# Patient Record
Sex: Male | Born: 1965 | Race: White | Hispanic: No | Smoking: Never smoker
Health system: Southern US, Community
[De-identification: ages and names within clinical notes are randomized; demographics above are authoritative.]

## PROBLEM LIST (undated history)

## (undated) DIAGNOSIS — E785 Hyperlipidemia, unspecified: Secondary | ICD-10-CM

## (undated) DIAGNOSIS — F419 Anxiety disorder, unspecified: Secondary | ICD-10-CM

## (undated) DIAGNOSIS — E119 Type 2 diabetes mellitus without complications: Secondary | ICD-10-CM

## (undated) DIAGNOSIS — R609 Edema, unspecified: Secondary | ICD-10-CM

## (undated) DIAGNOSIS — I5032 Chronic diastolic (congestive) heart failure: Secondary | ICD-10-CM

## (undated) DIAGNOSIS — R600 Localized edema: Secondary | ICD-10-CM

## (undated) DIAGNOSIS — F329 Major depressive disorder, single episode, unspecified: Secondary | ICD-10-CM

## (undated) DIAGNOSIS — I509 Heart failure, unspecified: Secondary | ICD-10-CM

## (undated) DIAGNOSIS — I10 Essential (primary) hypertension: Secondary | ICD-10-CM

## (undated) DIAGNOSIS — F32A Depression, unspecified: Secondary | ICD-10-CM

## (undated) HISTORY — DX: Edema, unspecified: R60.9

## (undated) HISTORY — PX: TONSILLECTOMY: SUR1361

## (undated) HISTORY — PX: BELOW KNEE LEG AMPUTATION: SUR23

## (undated) HISTORY — DX: Morbid (severe) obesity due to excess calories: E66.01

## (undated) HISTORY — DX: Chronic diastolic (congestive) heart failure: I50.32

## (undated) HISTORY — DX: Localized edema: R60.0

## (undated) HISTORY — PX: APPENDECTOMY: SHX54

## (undated) HISTORY — DX: Anxiety disorder, unspecified: F41.9

---

## 2004-03-20 ENCOUNTER — Emergency Department: Payer: Self-pay | Admitting: Emergency Medicine

## 2004-08-30 ENCOUNTER — Emergency Department: Payer: Self-pay | Admitting: Emergency Medicine

## 2004-08-30 ENCOUNTER — Other Ambulatory Visit: Payer: Self-pay

## 2004-09-05 ENCOUNTER — Emergency Department: Payer: Self-pay | Admitting: Emergency Medicine

## 2004-09-05 ENCOUNTER — Other Ambulatory Visit: Payer: Self-pay

## 2004-10-14 ENCOUNTER — Emergency Department: Payer: Self-pay | Admitting: Emergency Medicine

## 2005-05-12 ENCOUNTER — Emergency Department: Payer: Self-pay | Admitting: Internal Medicine

## 2005-05-22 ENCOUNTER — Inpatient Hospital Stay: Payer: Self-pay | Admitting: Psychiatry

## 2005-05-22 ENCOUNTER — Other Ambulatory Visit: Payer: Self-pay

## 2005-07-25 ENCOUNTER — Emergency Department: Payer: Self-pay | Admitting: Emergency Medicine

## 2005-08-13 ENCOUNTER — Emergency Department: Payer: Self-pay | Admitting: Emergency Medicine

## 2005-09-22 ENCOUNTER — Ambulatory Visit: Payer: Self-pay | Admitting: Pain Medicine

## 2005-09-28 ENCOUNTER — Ambulatory Visit: Payer: Self-pay | Admitting: Pain Medicine

## 2005-10-11 ENCOUNTER — Ambulatory Visit: Payer: Self-pay | Admitting: Pain Medicine

## 2005-10-14 ENCOUNTER — Ambulatory Visit: Payer: Self-pay | Admitting: Pain Medicine

## 2005-10-16 ENCOUNTER — Ambulatory Visit: Payer: Self-pay

## 2005-10-20 ENCOUNTER — Other Ambulatory Visit: Payer: Self-pay

## 2005-10-20 ENCOUNTER — Inpatient Hospital Stay: Payer: Self-pay | Admitting: Internal Medicine

## 2005-10-22 ENCOUNTER — Other Ambulatory Visit: Payer: Self-pay

## 2005-11-07 ENCOUNTER — Emergency Department: Payer: Self-pay | Admitting: General Practice

## 2005-11-15 ENCOUNTER — Emergency Department: Payer: Self-pay | Admitting: Emergency Medicine

## 2005-12-02 ENCOUNTER — Ambulatory Visit: Payer: Self-pay

## 2005-12-13 ENCOUNTER — Ambulatory Visit: Payer: Self-pay | Admitting: Pain Medicine

## 2006-02-07 ENCOUNTER — Ambulatory Visit: Payer: Self-pay | Admitting: Pain Medicine

## 2006-02-17 ENCOUNTER — Ambulatory Visit: Payer: Self-pay | Admitting: Pain Medicine

## 2006-03-08 ENCOUNTER — Ambulatory Visit: Payer: Self-pay | Admitting: Pain Medicine

## 2006-03-23 ENCOUNTER — Ambulatory Visit: Payer: Self-pay | Admitting: Physician Assistant

## 2006-04-05 ENCOUNTER — Other Ambulatory Visit: Payer: Self-pay

## 2006-04-05 ENCOUNTER — Inpatient Hospital Stay: Payer: Self-pay | Admitting: Internal Medicine

## 2006-04-11 ENCOUNTER — Ambulatory Visit: Payer: Self-pay | Admitting: Physician Assistant

## 2006-04-18 ENCOUNTER — Ambulatory Visit: Payer: Self-pay | Admitting: Pain Medicine

## 2006-07-24 ENCOUNTER — Emergency Department: Payer: Self-pay | Admitting: Unknown Physician Specialty

## 2006-08-23 ENCOUNTER — Emergency Department: Payer: Self-pay | Admitting: Emergency Medicine

## 2007-05-11 ENCOUNTER — Emergency Department: Payer: Self-pay | Admitting: Emergency Medicine

## 2007-05-29 ENCOUNTER — Other Ambulatory Visit: Payer: Self-pay

## 2007-05-29 ENCOUNTER — Inpatient Hospital Stay: Payer: Self-pay | Admitting: Internal Medicine

## 2007-07-19 ENCOUNTER — Emergency Department: Payer: Self-pay | Admitting: Emergency Medicine

## 2007-07-27 ENCOUNTER — Emergency Department: Payer: Self-pay | Admitting: Emergency Medicine

## 2007-07-27 ENCOUNTER — Other Ambulatory Visit: Payer: Self-pay

## 2007-08-06 ENCOUNTER — Emergency Department: Payer: Self-pay | Admitting: Emergency Medicine

## 2007-08-06 ENCOUNTER — Other Ambulatory Visit: Payer: Self-pay

## 2008-07-21 ENCOUNTER — Ambulatory Visit: Payer: Self-pay | Admitting: Family Medicine

## 2008-07-24 ENCOUNTER — Inpatient Hospital Stay: Payer: Self-pay | Admitting: Internal Medicine

## 2008-08-05 ENCOUNTER — Ambulatory Visit: Payer: Self-pay | Admitting: Internal Medicine

## 2008-08-08 ENCOUNTER — Ambulatory Visit: Payer: Self-pay | Admitting: Internal Medicine

## 2008-08-12 ENCOUNTER — Ambulatory Visit: Payer: Self-pay | Admitting: Internal Medicine

## 2008-08-19 ENCOUNTER — Ambulatory Visit: Payer: Self-pay | Admitting: Internal Medicine

## 2008-08-21 ENCOUNTER — Ambulatory Visit: Payer: Self-pay | Admitting: Internal Medicine

## 2008-08-23 ENCOUNTER — Ambulatory Visit: Payer: Self-pay | Admitting: Podiatry

## 2008-08-26 ENCOUNTER — Ambulatory Visit: Payer: Self-pay | Admitting: Internal Medicine

## 2008-09-06 ENCOUNTER — Ambulatory Visit: Payer: Self-pay | Admitting: Podiatry

## 2008-10-16 ENCOUNTER — Emergency Department: Payer: Self-pay | Admitting: Internal Medicine

## 2009-02-12 ENCOUNTER — Ambulatory Visit: Payer: Self-pay | Admitting: Physician Assistant

## 2009-02-16 ENCOUNTER — Emergency Department: Payer: Self-pay | Admitting: Emergency Medicine

## 2009-04-08 ENCOUNTER — Inpatient Hospital Stay: Payer: Self-pay | Admitting: Internal Medicine

## 2009-05-26 ENCOUNTER — Inpatient Hospital Stay: Payer: Self-pay | Admitting: Orthopedic Surgery

## 2009-07-02 ENCOUNTER — Ambulatory Visit: Payer: Self-pay | Admitting: Internal Medicine

## 2009-07-06 ENCOUNTER — Inpatient Hospital Stay: Payer: Self-pay | Admitting: Internal Medicine

## 2009-07-18 ENCOUNTER — Emergency Department: Payer: Self-pay | Admitting: Emergency Medicine

## 2009-07-21 ENCOUNTER — Inpatient Hospital Stay: Payer: Self-pay | Admitting: Orthopedic Surgery

## 2009-08-06 ENCOUNTER — Ambulatory Visit: Payer: Self-pay | Admitting: Family

## 2009-08-19 ENCOUNTER — Emergency Department: Payer: Self-pay

## 2009-10-13 ENCOUNTER — Emergency Department: Payer: Self-pay | Admitting: Emergency Medicine

## 2009-11-16 ENCOUNTER — Emergency Department: Payer: Self-pay | Admitting: Emergency Medicine

## 2011-04-30 ENCOUNTER — Emergency Department: Payer: Self-pay | Admitting: Emergency Medicine

## 2011-04-30 LAB — TROPONIN I: Troponin-I: <0.02 ng/mL

## 2011-04-30 LAB — COMPREHENSIVE METABOLIC PANEL
Albumin: 3.5 g/dL (ref 3.4–5.0)
Alkaline Phosphatase: 117 U/L (ref 50–136)
Anion Gap: 16 (ref 7–16)
BUN: 23 mg/dL — ABNORMAL HIGH (ref 7–18)
Bilirubin,Total: 0.2 mg/dL (ref 0.2–1.0)
Creatinine: 0.99 mg/dL (ref 0.60–1.30)
EGFR (African American): >60
Glucose: 285 mg/dL — ABNORMAL HIGH (ref 65–99)
Osmolality: 284 (ref 275–301)
Potassium: 3.8 mmol/L (ref 3.5–5.1)
SGOT(AST): 26 U/L (ref 15–37)
Sodium: 135 mmol/L — ABNORMAL LOW (ref 136–145)
Total Protein: 8.1 g/dL (ref 6.4–8.2)

## 2011-04-30 LAB — CBC
MCV: 82 fL (ref 80–100)
Platelet: 203 10*3/uL (ref 150–440)
RBC: 5.46 10*6/uL (ref 4.40–5.90)
RDW: 14.2 % (ref 11.5–14.5)
WBC: 9.3 10*3/uL (ref 3.8–10.6)

## 2011-05-06 ENCOUNTER — Emergency Department: Payer: Self-pay | Admitting: Unknown Physician Specialty

## 2011-05-06 LAB — URINALYSIS, COMPLETE
Bilirubin,UR: NEGATIVE
Blood: NEGATIVE
Glucose,UR: 50 mg/dL (ref 0–75)
Hyaline Cast: 16
Ketone: NEGATIVE
Leukocyte Esterase: NEGATIVE
Ph: 5 (ref 4.5–8.0)
RBC,UR: 1 /HPF (ref 0–5)
Squamous Epithelial: <1

## 2011-05-06 LAB — COMPREHENSIVE METABOLIC PANEL
Alkaline Phosphatase: 113 U/L (ref 50–136)
Bilirubin,Total: 0.3 mg/dL (ref 0.2–1.0)
Chloride: 100 mmol/L (ref 98–107)
Co2: 31 mmol/L (ref 21–32)
Creatinine: 1.2 mg/dL (ref 0.60–1.30)
EGFR (African American): >60
EGFR (Non-African Amer.): >60
Osmolality: 289 (ref 275–301)
Potassium: 4 mmol/L (ref 3.5–5.1)
Sodium: 144 mmol/L (ref 136–145)

## 2011-05-06 LAB — CBC WITH DIFFERENTIAL/PLATELET
Eosinophil %: 3.6 %
HCT: 45.8 % (ref 40.0–52.0)
Lymphocyte #: 2.7 10*3/uL (ref 1.0–3.6)
Lymphocyte %: 22.4 %
MCH: 27.3 pg (ref 26.0–34.0)
MCV: 82 fL (ref 80–100)
Monocyte #: 0.8 10*3/uL — ABNORMAL HIGH (ref 0.0–0.7)
Monocyte %: 6.5 %
Neutrophil #: 8.1 10*3/uL — ABNORMAL HIGH (ref 1.4–6.5)
Platelet: 221 10*3/uL (ref 150–440)
RBC: 5.58 10*6/uL (ref 4.40–5.90)
RDW: 14 % (ref 11.5–14.5)
WBC: 12.1 10*3/uL — ABNORMAL HIGH (ref 3.8–10.6)

## 2011-05-06 LAB — MAGNESIUM: Magnesium: 1.6 mg/dL — ABNORMAL LOW

## 2011-05-06 LAB — TROPONIN I: Troponin-I: <0.02 ng/mL

## 2011-05-12 LAB — CULTURE, BLOOD (SINGLE)

## 2011-05-17 ENCOUNTER — Emergency Department: Payer: Self-pay | Admitting: Emergency Medicine

## 2011-05-17 LAB — BASIC METABOLIC PANEL
Calcium, Total: 8.5 mg/dL (ref 8.5–10.1)
Chloride: 93 mmol/L — ABNORMAL LOW (ref 98–107)
Co2: 32 mmol/L (ref 21–32)
EGFR (African American): >60
EGFR (Non-African Amer.): >60
Glucose: 429 mg/dL — ABNORMAL HIGH (ref 65–99)
Osmolality: 287 (ref 275–301)
Potassium: 4.1 mmol/L (ref 3.5–5.1)
Sodium: 133 mmol/L — ABNORMAL LOW (ref 136–145)

## 2011-05-19 LAB — COMPREHENSIVE METABOLIC PANEL
Alkaline Phosphatase: 92 U/L (ref 50–136)
Anion Gap: 7 (ref 7–16)
BUN: 14 mg/dL (ref 7–18)
Bilirubin,Total: 0.4 mg/dL (ref 0.2–1.0)
EGFR (African American): >60
EGFR (Non-African Amer.): >60
Glucose: 119 mg/dL — ABNORMAL HIGH (ref 65–99)
Osmolality: 272 (ref 275–301)
Potassium: 3.8 mmol/L (ref 3.5–5.1)
SGOT(AST): 29 U/L (ref 15–37)
Sodium: 140 mmol/L (ref 136–145)

## 2011-05-19 LAB — CBC
HCT: 41.6 % (ref 40.0–52.0)
MCHC: 33.1 g/dL (ref 32.0–36.0)
MCV: 82 fL (ref 80–100)
Platelet: 193 10*3/uL (ref 150–440)
RDW: 14.1 % (ref 11.5–14.5)

## 2011-05-19 LAB — TSH: Thyroid Stimulating Horm: 1.95 u[IU]/mL

## 2011-05-19 LAB — ETHANOL: Ethanol: <3 mg/dL

## 2011-05-19 LAB — DRUG SCREEN, URINE
Barbiturates, Ur Screen: NEGATIVE (ref ?–200)
Benzodiazepine, Ur Scrn: NEGATIVE (ref ?–200)
MDMA (Ecstasy)Ur Screen: NEGATIVE (ref ?–500)
Methadone, Ur Screen: NEGATIVE (ref ?–300)
Opiate, Ur Screen: NEGATIVE (ref ?–300)
Phencyclidine (PCP) Ur S: NEGATIVE (ref ?–25)
Tricyclic, Ur Screen: NEGATIVE (ref ?–1000)

## 2011-05-19 LAB — SALICYLATE LEVEL: Salicylates, Serum: <1.7 mg/dL

## 2011-05-20 ENCOUNTER — Inpatient Hospital Stay: Payer: Self-pay | Admitting: Psychiatry

## 2011-11-08 ENCOUNTER — Emergency Department: Payer: Self-pay | Admitting: Unknown Physician Specialty

## 2011-11-08 LAB — MAGNESIUM: Magnesium: 1.7 mg/dL — ABNORMAL LOW

## 2011-11-08 LAB — BASIC METABOLIC PANEL
Anion Gap: 9 (ref 7–16)
BUN: 21 mg/dL — ABNORMAL HIGH (ref 7–18)
Chloride: 94 mmol/L — ABNORMAL LOW (ref 98–107)
Creatinine: 1.4 mg/dL — ABNORMAL HIGH (ref 0.60–1.30)
EGFR (Non-African Amer.): 59 — ABNORMAL LOW
Potassium: 3.8 mmol/L (ref 3.5–5.1)
Sodium: 135 mmol/L — ABNORMAL LOW (ref 136–145)

## 2011-11-08 LAB — CBC
HCT: 40.6 % (ref 40.0–52.0)
HGB: 13.6 g/dL (ref 13.0–18.0)
MCHC: 33.5 g/dL (ref 32.0–36.0)
Platelet: 202 10*3/uL (ref 150–440)
RBC: 4.95 10*6/uL (ref 4.40–5.90)

## 2011-11-08 LAB — HEPATIC FUNCTION PANEL A (ARMC)
Bilirubin, Direct: <0.1 mg/dL (ref 0.00–0.20)
Bilirubin,Total: 0.3 mg/dL (ref 0.2–1.0)

## 2011-11-08 LAB — TROPONIN I: Troponin-I: <0.02 ng/mL

## 2011-11-08 LAB — LIPASE, BLOOD: Lipase: 42 U/L — ABNORMAL LOW (ref 73–393)

## 2012-08-10 ENCOUNTER — Emergency Department: Payer: Self-pay | Admitting: Emergency Medicine

## 2012-08-10 LAB — BASIC METABOLIC PANEL
Anion Gap: 7 (ref 7–16)
BUN: 18 mg/dL (ref 7–18)
Co2: 30 mmol/L (ref 21–32)
EGFR (African American): >60
Osmolality: 272 (ref 275–301)
Potassium: 3.6 mmol/L (ref 3.5–5.1)

## 2012-08-10 LAB — CBC
HGB: 15.5 g/dL (ref 13.0–18.0)
MCV: 80 fL (ref 80–100)
Platelet: 256 10*3/uL (ref 150–440)
RBC: 5.87 10*6/uL (ref 4.40–5.90)
RDW: 14.6 % — ABNORMAL HIGH (ref 11.5–14.5)

## 2012-08-10 LAB — HEMOGLOBIN A1C: Hemoglobin A1C: 10.2 % — ABNORMAL HIGH (ref 4.2–6.3)

## 2012-08-10 LAB — TROPONIN I: Troponin-I: <0.02 ng/mL

## 2013-08-13 ENCOUNTER — Emergency Department: Payer: Self-pay | Admitting: Emergency Medicine

## 2013-08-13 LAB — COMPREHENSIVE METABOLIC PANEL
ALT: 25 U/L (ref 12–78)
ANION GAP: 7 (ref 7–16)
Albumin: 3.1 g/dL — ABNORMAL LOW (ref 3.4–5.0)
Alkaline Phosphatase: 131 U/L — ABNORMAL HIGH
BILIRUBIN TOTAL: 0.3 mg/dL (ref 0.2–1.0)
BUN: 24 mg/dL — ABNORMAL HIGH (ref 7–18)
CHLORIDE: 95 mmol/L — AB (ref 98–107)
CO2: 33 mmol/L — AB (ref 21–32)
Calcium, Total: 8.8 mg/dL (ref 8.5–10.1)
Creatinine: 1 mg/dL (ref 0.60–1.30)
EGFR (African American): >60
EGFR (Non-African Amer.): >60
Glucose: 212 mg/dL — ABNORMAL HIGH (ref 65–99)
Osmolality: 280 (ref 275–301)
Potassium: 3.2 mmol/L — ABNORMAL LOW (ref 3.5–5.1)
SGOT(AST): 17 U/L (ref 15–37)
SODIUM: 135 mmol/L — AB (ref 136–145)
TOTAL PROTEIN: 7.1 g/dL (ref 6.4–8.2)

## 2013-08-13 LAB — CBC WITH DIFFERENTIAL/PLATELET
Basophil #: 0.1 10*3/uL (ref 0.0–0.1)
Basophil %: 1.1 %
Eosinophil #: 0.3 10*3/uL (ref 0.0–0.7)
Eosinophil %: 3.5 %
HCT: 41.1 % (ref 40.0–52.0)
HGB: 13.9 g/dL (ref 13.0–18.0)
LYMPHS PCT: 25 %
Lymphocyte #: 1.9 10*3/uL (ref 1.0–3.6)
MCH: 27.9 pg (ref 26.0–34.0)
MCHC: 33.9 g/dL (ref 32.0–36.0)
MCV: 82 fL (ref 80–100)
MONOS PCT: 7.7 %
Monocyte #: 0.6 x10 3/mm (ref 0.2–1.0)
NEUTROS PCT: 62.7 %
Neutrophil #: 4.8 10*3/uL (ref 1.4–6.5)
Platelet: 218 10*3/uL (ref 150–440)
RBC: 5 10*6/uL (ref 4.40–5.90)
RDW: 14.1 % (ref 11.5–14.5)
WBC: 7.6 10*3/uL (ref 3.8–10.6)

## 2013-08-13 LAB — URINALYSIS, COMPLETE
BILIRUBIN, UR: NEGATIVE
Bacteria: NONE SEEN
Blood: NEGATIVE
Glucose,UR: 150 mg/dL (ref 0–75)
Ketone: NEGATIVE
Leukocyte Esterase: NEGATIVE
Nitrite: NEGATIVE
Ph: 5 (ref 4.5–8.0)
Protein: NEGATIVE
RBC, UR: NONE SEEN /HPF (ref 0–5)
Specific Gravity: 1.012 (ref 1.003–1.030)
Squamous Epithelial: NONE SEEN
WBC UR: NONE SEEN /HPF (ref 0–5)

## 2013-10-16 ENCOUNTER — Encounter: Payer: Self-pay | Admitting: Family Medicine

## 2013-10-30 ENCOUNTER — Encounter: Payer: Self-pay | Admitting: Family Medicine

## 2014-05-17 ENCOUNTER — Ambulatory Visit: Payer: Self-pay | Admitting: Cardiovascular Disease

## 2014-06-05 LAB — COMPREHENSIVE METABOLIC PANEL
ALT: 24 U/L
Albumin: 3.5 g/dL
Alkaline Phosphatase: 111 U/L
Anion Gap: 7 (ref 7–16)
BUN: 19 mg/dL
Bilirubin,Total: 0.3 mg/dL
CHLORIDE: 100 mmol/L — AB
CREATININE: 0.89 mg/dL
Calcium, Total: 8.9 mg/dL
Co2: 28 mmol/L
Glucose: 282 mg/dL — ABNORMAL HIGH
POTASSIUM: 4.4 mmol/L
SGOT(AST): 28 U/L
Sodium: 135 mmol/L
Total Protein: 7.3 g/dL

## 2014-06-05 LAB — CBC
HCT: 43.2 % (ref 40.0–52.0)
HGB: 13.9 g/dL (ref 13.0–18.0)
MCH: 25.7 pg — ABNORMAL LOW (ref 26.0–34.0)
MCHC: 32.2 g/dL (ref 32.0–36.0)
MCV: 80 fL (ref 80–100)
PLATELETS: 232 10*3/uL (ref 150–440)
RBC: 5.41 10*6/uL (ref 4.40–5.90)
RDW: 15 % — AB (ref 11.5–14.5)
WBC: 8.8 10*3/uL (ref 3.8–10.6)

## 2014-06-05 LAB — PRO B NATRIURETIC PEPTIDE: B-Type Natriuretic Peptide: 10 pg/mL

## 2014-06-05 LAB — PROTIME-INR
INR: 0.9
Prothrombin Time: 12.8 secs

## 2014-06-06 ENCOUNTER — Inpatient Hospital Stay
Admit: 2014-06-06 | Disposition: A | Discharge status: Home / Self Care | Payer: Self-pay | Attending: Internal Medicine | Admitting: Internal Medicine

## 2014-06-06 DIAGNOSIS — R0602 Shortness of breath: Secondary | ICD-10-CM | POA: Diagnosis not present

## 2014-06-06 LAB — CK TOTAL AND CKMB (NOT AT ARMC)
CK, Total: 205 U/L
CK-MB: 11 ng/mL — AB

## 2014-06-06 LAB — TROPONIN I

## 2014-06-06 LAB — MRSA PCR SCREENING

## 2014-06-07 LAB — CBC WITH DIFFERENTIAL/PLATELET
BASOS ABS: 0 10*3/uL (ref 0.0–0.1)
BASOS PCT: 0.3 %
Eosinophil #: 0.3 10*3/uL (ref 0.0–0.7)
Eosinophil %: 3.1 %
HCT: 40.3 % (ref 40.0–52.0)
HGB: 13 g/dL (ref 13.0–18.0)
LYMPHS PCT: 18.2 %
Lymphocyte #: 1.6 10*3/uL (ref 1.0–3.6)
MCH: 25.9 pg — ABNORMAL LOW (ref 26.0–34.0)
MCHC: 32.3 g/dL (ref 32.0–36.0)
MCV: 80 fL (ref 80–100)
MONOS PCT: 6.8 %
Monocyte #: 0.6 x10 3/mm (ref 0.2–1.0)
NEUTROS ABS: 6.1 10*3/uL (ref 1.4–6.5)
Neutrophil %: 71.6 %
Platelet: 213 10*3/uL (ref 150–440)
RBC: 5.02 10*6/uL (ref 4.40–5.90)
RDW: 14.8 % — AB (ref 11.5–14.5)
WBC: 8.6 10*3/uL (ref 3.8–10.6)

## 2014-06-07 LAB — BASIC METABOLIC PANEL
ANION GAP: 7 (ref 7–16)
BUN: 17 mg/dL
CALCIUM: 8.3 mg/dL — AB
CHLORIDE: 100 mmol/L — AB
Co2: 29 mmol/L
Creatinine: 0.8 mg/dL
Glucose: 262 mg/dL — ABNORMAL HIGH
Potassium: 4.3 mmol/L
Sodium: 136 mmol/L

## 2014-06-07 LAB — HEMOGLOBIN A1C
Hemoglobin A1C: 10.4 % — ABNORMAL HIGH
Hemoglobin A1C: 10.4 % — ABNORMAL HIGH

## 2014-06-07 LAB — VANCOMYCIN, TROUGH: Vancomycin, Trough: 16 ug/mL

## 2014-06-08 LAB — BASIC METABOLIC PANEL
Anion Gap: 5 — ABNORMAL LOW (ref 7–16)
BUN: 21 mg/dL — ABNORMAL HIGH
Calcium, Total: 8.5 mg/dL — ABNORMAL LOW
Chloride: 97 mmol/L — ABNORMAL LOW
Co2: 31 mmol/L
Creatinine: 0.77 mg/dL
EGFR (African American): >60
EGFR (Non-African Amer.): >60
GLUCOSE: 321 mg/dL — AB
Potassium: 3.9 mmol/L
SODIUM: 133 mmol/L — AB

## 2014-06-09 LAB — BASIC METABOLIC PANEL
ANION GAP: 7 (ref 7–16)
BUN: 22 mg/dL — ABNORMAL HIGH
CALCIUM: 8.8 mg/dL — AB
CO2: 32 mmol/L
Chloride: 95 mmol/L — ABNORMAL LOW
Creatinine: 0.91 mg/dL
GLUCOSE: 288 mg/dL — AB
Potassium: 4 mmol/L
Sodium: 134 mmol/L — ABNORMAL LOW

## 2014-06-09 LAB — POTASSIUM: POTASSIUM: 4 mmol/L

## 2014-06-09 LAB — VANCOMYCIN, TROUGH: VANCOMYCIN, TROUGH: 15 ug/mL

## 2014-06-10 LAB — CBC WITH DIFFERENTIAL/PLATELET
BASOS PCT: 0.4 %
Basophil #: 0 10*3/uL (ref 0.0–0.1)
EOS PCT: 3.3 %
Eosinophil #: 0.3 10*3/uL (ref 0.0–0.7)
HCT: 39.4 % — ABNORMAL LOW (ref 40.0–52.0)
HGB: 13 g/dL (ref 13.0–18.0)
Lymphocyte #: 1.8 10*3/uL (ref 1.0–3.6)
Lymphocyte %: 19.2 %
MCH: 26.2 pg (ref 26.0–34.0)
MCHC: 32.9 g/dL (ref 32.0–36.0)
MCV: 80 fL (ref 80–100)
MONO ABS: 0.6 x10 3/mm (ref 0.2–1.0)
Monocyte %: 6.3 %
Neutrophil #: 6.7 10*3/uL — ABNORMAL HIGH (ref 1.4–6.5)
Neutrophil %: 70.8 %
Platelet: 205 10*3/uL (ref 150–440)
RBC: 4.96 10*6/uL (ref 4.40–5.90)
RDW: 15 % — ABNORMAL HIGH (ref 11.5–14.5)
WBC: 9.5 10*3/uL (ref 3.8–10.6)

## 2014-06-10 LAB — BASIC METABOLIC PANEL
Anion Gap: 9 (ref 7–16)
BUN: 25 mg/dL — ABNORMAL HIGH
CALCIUM: 8.9 mg/dL
CHLORIDE: 96 mmol/L — AB
CO2: 31 mmol/L
CREATININE: 0.81 mg/dL
EGFR (African American): >60
EGFR (Non-African Amer.): >60
Glucose: 302 mg/dL — ABNORMAL HIGH
POTASSIUM: 4 mmol/L
SODIUM: 136 mmol/L

## 2014-06-11 LAB — BASIC METABOLIC PANEL
Anion Gap: 8 (ref 7–16)
BUN: 24 mg/dL — ABNORMAL HIGH
CALCIUM: 8.6 mg/dL — AB
Chloride: 94 mmol/L — ABNORMAL LOW
Co2: 30 mmol/L
Creatinine: 0.82 mg/dL
EGFR (African American): >60
Glucose: 241 mg/dL — ABNORMAL HIGH
Potassium: 4.2 mmol/L
SODIUM: 132 mmol/L — AB

## 2014-06-11 LAB — VANCOMYCIN, TROUGH: VANCOMYCIN, TROUGH: 18 ug/mL

## 2014-06-12 LAB — CBC WITH DIFFERENTIAL/PLATELET
Basophil #: 0.1 10*3/uL (ref 0.0–0.1)
Basophil %: 0.6 %
EOS ABS: 0.3 10*3/uL (ref 0.0–0.7)
Eosinophil %: 3.7 %
HCT: 40.6 % (ref 40.0–52.0)
HGB: 13.4 g/dL (ref 13.0–18.0)
Lymphocyte #: 1.6 10*3/uL (ref 1.0–3.6)
Lymphocyte %: 17.2 %
MCH: 26.2 pg (ref 26.0–34.0)
MCHC: 33 g/dL (ref 32.0–36.0)
MCV: 79 fL — ABNORMAL LOW (ref 80–100)
MONOS PCT: 6 %
Monocyte #: 0.6 x10 3/mm (ref 0.2–1.0)
NEUTROS ABS: 6.7 10*3/uL — AB (ref 1.4–6.5)
NEUTROS PCT: 72.5 %
Platelet: 195 10*3/uL (ref 150–440)
RBC: 5.11 10*6/uL (ref 4.40–5.90)
RDW: 15.1 % — ABNORMAL HIGH (ref 11.5–14.5)
WBC: 9.2 10*3/uL (ref 3.8–10.6)

## 2014-06-12 LAB — BASIC METABOLIC PANEL
Anion Gap: 6 — ABNORMAL LOW (ref 7–16)
BUN: 22 mg/dL — AB
CO2: 31 mmol/L
CREATININE: 0.78 mg/dL
Calcium, Total: 8.5 mg/dL — ABNORMAL LOW
Chloride: 99 mmol/L — ABNORMAL LOW
EGFR (Non-African Amer.): >60
GLUCOSE: 176 mg/dL — AB
POTASSIUM: 4.2 mmol/L
SODIUM: 136 mmol/L

## 2014-06-13 LAB — CBC WITH DIFFERENTIAL/PLATELET
BASOS ABS: 0.1 10*3/uL (ref 0.0–0.1)
Basophil %: 0.7 %
Eosinophil #: 0.4 10*3/uL (ref 0.0–0.7)
Eosinophil %: 4.1 %
HCT: 39 % — AB (ref 40.0–52.0)
HGB: 12.6 g/dL — ABNORMAL LOW (ref 13.0–18.0)
LYMPHS PCT: 20.7 %
Lymphocyte #: 2.1 10*3/uL (ref 1.0–3.6)
MCH: 26 pg (ref 26.0–34.0)
MCHC: 32.4 g/dL (ref 32.0–36.0)
MCV: 80 fL (ref 80–100)
MONOS PCT: 7.5 %
Monocyte #: 0.8 x10 3/mm (ref 0.2–1.0)
NEUTROS ABS: 6.9 10*3/uL — AB (ref 1.4–6.5)
Neutrophil %: 67 %
Platelet: 190 10*3/uL (ref 150–440)
RBC: 4.86 10*6/uL (ref 4.40–5.90)
RDW: 15.1 % — AB (ref 11.5–14.5)
WBC: 10.3 10*3/uL (ref 3.8–10.6)

## 2014-06-13 LAB — BASIC METABOLIC PANEL
Anion Gap: 7 (ref 7–16)
BUN: 22 mg/dL — AB
CALCIUM: 8.6 mg/dL — AB
CHLORIDE: 100 mmol/L — AB
Co2: 29 mmol/L
GLUCOSE: 143 mg/dL — AB
Potassium: 4 mmol/L
Sodium: 136 mmol/L

## 2014-06-13 LAB — CREATININE, SERUM
Creatinine: 0.87 mg/dL
EGFR (Non-African Amer.): >60

## 2014-06-16 LAB — WOUND CULTURE

## 2014-06-23 NOTE — Discharge Summary (Signed)
PATIENT NAME:  Russell [name], Russell [name] MR#:  [id number] DATE OF BIRTH:  [birth year]  DATE OF ADMISSION:  05/20/2011 DATE OF DISCHARGE:  05/25/2011  HOSPITAL COURSE: Mr. Below was admitted through the Emergency Room because of allegations that he had been making suicidal threats as well as what sounded like potentially violent threats at his group home. The patient tended to minimize or deny any mood symptoms and expressed real surprise that they had committed him. Throughout his hospital stay he denied any suicidal ideation, denied any homicidal ideation. Mood generally appeared fairly euthymic. He does often appear to be dependent and his insight is not great. 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 In the hospital he did have an increase in his antidepressant to 40 mg of Celexa but then based on his history preferred to switch to Effexor. Medicine was switched to Effexor-XR 75 mg a day which he tolerated well. The patient clearly has a history of poorly controlled diabetes with multiple medical problems as a result. He was on insulin and sliding scale insulin during his hospital stay. Efforts were made to try and get his sugars under control. Despite this, his sugars still frequently remained elevated. In large part this was probably due to his diet and noncompliance. The patient complained frequently about being put on a diabetic diet and would sneak food as often as possible. There was question about the difficulty of discharge of this patient until it transpired that his wife was willing to take him back. He will be discharged to his wife's home and has follow-up arranged in the community.   DISCHARGE MEDICATIONS:  1. Zocor 20 mg per day.  2. Trazodone 100 mg at night.  3. NovoLog insulin 10 units three times a day with meals.  4. Lotrimin 1% cream to affected area of his feet.  5. Effexor-XR 75 mg once a day.  6. Lantus insulin 40 units  twice a day.  7. Augmentin 875 mg twice a day just for the next five days.  8. Aspirin 81 mg per day.  9. Lasix 40 mg per day.  10. Gabapentin 1200 mg 3 times a day.  11. Metoprolol 50 mg q.12 hours. 12. Zaroxolyn 2.5 mg once a day.  13. Lisinopril 5 mg per day.   LABORATORY RESULTS: Blood sugars were running in the 400's at the time of admission. With efforts to control his sugars, they came down to in the 200 range but it was difficult because of his diet and noncompliance to get it much lower than that. Admission labs showed a glucose of 429, sodium 133, chloride 93. Hematology done the following day was normal CBC. Acetaminophen and salicylates normal. Alcohol level undetectable. TSH normal. Hemoglobin A1c very elevated at 10.2. Drug screen negative.   MENTAL STATUS EXAM AT DISCHARGE: Somewhat disheveled, not terribly well groomed man in a wheelchair. Eye contact good. Psychomotor activity normal given his limitations physically. Speech normal rate, tone, and volume. Affect euthymic, smiling, upbeat. Mood stated as good. Thoughts lucid and directed. No obvious loosening of associations or delusional thinking. Denies suicidal or homicidal ideation. Denies hallucinations. Appears to have reasonable judgment about his mood, although his judgment and insight about his diabetes is clearly chronically not very good despite lots of education.   DISPOSITION: He is discharged back to his wife's home.   DIAGNOSES PRINCIPLE AND PRIMARY:  AXIS I: Depression, not otherwise specified.   SECONDARY DIAGNOSES:  AXIS I: No  further.  AXIS II: Dependent and borderline features noted.   AXIS III:  1. Diabetes mellitus, status post amputation. 2. High blood pressure. 3. Chronic pain. 4. Fungal infection. 5. Elevated cholesterol.   AXIS IV: Severe from his disability, pain, chronic illness, dislocation.       AXIS V: Functioning at time of discharge 55.   ____________________________ Gonzella Lex, MD jtc:drc D: 06/06/2011 17:25:38 ET T: 06/07/2011 11:27:35 ET JOB#: [id number]  cc: Gonzella Lex, MD, <Dictator> Gonzella Lex MD ELECTRONICALLY SIGNED 06/07/2011 14:55

## 2014-06-23 NOTE — H&P (Signed)
PATIENT NAME:  Russell [name], DYCK MR#:  [id number] DATE OF BIRTH:  December 07, 1965  DATE OF ADMISSION:  05/19/2011  REFERRING PHYSICIAN: Graciella Freer, MD  ATTENDING PHYSICIAN: Orson Slick, MD   IDENTIFYING DATA: Russell [name] is a 49 year old male with history of depression.   CHIEF COMPLAINT: "I wasn't suicidal."   HISTORY OF PRESENT ILLNESS: [name] has been living in an adult care home for a couple of months. He was accepted there in spite of his age, clearly younger than the rest of the residents. The Facility now thinks that this was a mismatch as the patient is behaving poorly, threatening to hurt himself and other people. He said yesterday that when he will go he will take a lot of people with him. The Police was called. The patient adamantly denies ever having suicidal statements. To the contrary, he feels that in spite of differences in age and interest he was getting along fine with other residents and staff. He feels that  they don't want him back in retaliation for his threats to send complaints to the [facility]. He feels that the place is unsanitary, requested to change his rooms, and has not been happy with the service there. In short, the patient may not return to the facility. The patient seems surprised by all this. He felt that he has been doing better lately, and in the past week he was having a really good time at the facility. He denies any symptoms of depression, anxiety, or psychosis. He denies alcohol, prescription pills, or illicit substance use. He is very adamant that he is a good [place] and would never ever hurt himself or anybody else. He was placed on involuntary commitment by the Emergency Room physician.   PAST PSYCHIATRIC HISTORY: He has a long history of depression. This is in part due to poor health and inadequate social situation in the past. He was hospitalized in Psychiatry three times in 2007, once at [facility] and twice at [facility] for psychotic  depression. He has been maintained on antidepressant at the family care home and thinks that with the addition of Ativan his medication is working really well. He denies suicide attempts.   FAMILY PSYCHIATRIC HISTORY: Mother and a sister with depression.    PAST MEDICAL/SURGICAL HISTORY:  1. Diabetes, status post her below-knee amputation.  2. Dyslipidemia.  3. Cellulitis on his good leg. 4. Neuropathy. 5. Hypertension.  ALLERGIES: Shellfish.   MEDICATIONS ON ADMISSION:  1. Zocor 20 mg daily.  2. Trazodone 50 mg at bedtime. 3. Augmentin 875 mg twice daily for 10 days. 4. Aspirin 81 mg. 5. Celexa 40 mg daily.  6. Furosemide 40 mg daily.  7. Neurontin 1200 mg three times daily.  8. NovoLog 12 units before meals. 9. Lantus 40 units in the morning.  10. Lisinopril 5 mg daily. 11. Metolazone  2.5 mg daily. 12. Metoprolol 50 mg twice daily.   SOCIAL HISTORY: He used to live with his wife, who is 13 years his senior, in subsidized housing. Per some report, the patient's behavior contributed to loss of this opportunity for the family. His wife requested a divorce. The patient is quite upset about it but also philosophical. As a good [place], he would never choose a divorce but feels that his wife has the right to decide. She has never been a favorite of his family. He has been living at the facility for the past two months. He would like to return there but is not allowed  to. He could not stay with his brother or with his mother as they do not have a ramp for his scooter. He is open to be placed in another facility.    REVIEW OF SYSTEMS: CONSTITUTIONAL: No fevers or chills. Positive for gradual weight gain. EYES: No double or blurred vision. ENT: No hearing loss. RESPIRATORY: No shortness of breath or cough. CARDIOVASCULAR: No chest pain or orthopnea. GASTROINTESTINAL: No abdominal pain, nausea, vomiting, or diarrhea. GENITOURINARY: No incontinence or frequency. ENDOCRINE: No heat or cold  intolerance. LYMPHATIC: No anemia or easy bruising. INTEGUMENTARY: Positive for cellulitis of the left leg. MUSCULOSKELETAL: He is an amputee. NEUROLOGIC: Positive for diabetic neuropathy. PSYCHIATRIC: See history of present illness for details.   PHYSICAL EXAMINATION:  VITAL SIGNS: Blood pressure 157/75, pulse 81, respirations 16, temperature 96.9.   GENERAL: This is a morbidly obese male in no acute distress.   HEENT: The pupils are equal, round, and reactive to light. Sclerae are anicteric.   NECK: Supple. No thyromegaly.   LUNGS: Clear to auscultation. No dullness to percussion.   HEART: Regular rhythm and rate. No murmurs, rubs, or gallops.   ABDOMEN: Soft, nontender, nondistended. Positive bowel sounds.   MUSCULOSKELETAL: Normal muscle strength in three good extremities.   SKIN: No rashes. Positive for redness, swelling on the left leg.   LYMPHATIC: No cervical adenopathy.   NEUROLOGICAL: Cranial nerves II through XII are intact.   LABORATORY, DIAGNOSTIC AND RADIOLOGICAL DATA:  Chemistries are within normal limits except for blood glucose of 119.  Blood alcohol level zero.  LFTs within normal limits.  TSH 1.95.  Urine toxicology screen negative for substances.  CBC within normal limits.  Serum acetaminophen and salicylates are low.   MENTAL STATUS EXAMINATION ON ADMISSION: The patient is examined in the Emergency Room. He is alert and oriented to person, place, time, and situation. He is pleasant, polite, and cooperative. There is some psychomotor retardation but possibly from obesity and the fact that he is an amputee. He is wearing a yellow shirt and burgundy scrubs. He maintains good eye contact. His speech is soft. Mood is depressed with flat affect. Thought processing is logical and goal oriented. Thought content: He denies suicidal or homicidal ideation but was brought to the Emergency Room from his assisted living facility after threatening to kill himself and others.  There are no delusions or paranoia. There are no auditory or visual hallucinations. His cognition is grossly intact. His insight and judgment are questionable.   SUICIDE RISK ASSESSMENT ON ADMISSION: This is a patient with a long history of mood instability, multiple medical and social problems, who apparently became upset and lost his cool at the nursing facility and came to the hospital suicidal.   DIAGNOSES:  AXIS I: Major depressive disorder, recurrent, severe.   AXIS II: Deferred.   AXIS III:  1. Diabetes.  2. Hypertension. 3. Neuropathy. 4. Status post below the knee amputation. 5. Dyslipidemia.   AXIS IV: Mental and physical illness, conflict at the group home, housing, marital, primary support.   AXIS V: Global Assessment of Functioning score on admission 25.   PLAN: The patient was admitted to [facility] for safety, stabilization and medication management. He was initially placed on suicide precautions and was closely monitored for any unsafe behaviors. He underwent full psychiatric and risk assessment. He received pharmacotherapy, individual and group psychotherapy, substance abuse counseling, and support from therapeutic milieu.   1. Suicidal ideation, homicidal ideation: This has resolved.  The patient denies any intention to hurt himself or others.  2. Mood: We will for now continue Celexa and increase it to 40 mg a day.  3. Medical: We will continue all his medications as prescribed by primary provider.  4. Diabetes: The patient claims that he was not given insulin on the last day of his stay at the facility. His sugars were quite high. We will restart Lantus, Novolin and do Accu-Cheks with sliding scale insulin. We will check his hemoglobin A1c.  5. Disposition: To be established. The patient will need a new PASRR. We started the application process in the Emergency Room. Already it appears that we do have a facility that would  take this patient. Assessment will be done shortly.  ____________________________ Wardell Honour. Bary Leriche, MD jbp:cbb D: 05/20/2011 15:03:12 ET T: 05/20/2011 15:24:01 ET JOB#: [id number]  cc: Loui Massenburg B. Bary Leriche, MD, <Dictator> Clovis Fredrickson MD ELECTRONICALLY SIGNED 05/20/2011 20:44

## 2014-06-23 NOTE — Consult Note (Signed)
PATIENT NAME:  Russell [name], Russell [name] MR#:  [id number] DATE OF BIRTH:  02-Aug-1965  DATE OF CONSULTATION:  05/21/2011  REFERRING PHYSICIAN:  Dr Weber Cooks CONSULTING PHYSICIAN:  Cherre Huger, MD  REASON FOR CONSULTATION: Diabetes, evaluation of multiple medical problems, and cellulitis.  HISTORY OF PRESENT ILLNESS: The patient is a 49 year old morbidly obese male with past medical history of diabetes, sleep apnea noncompliant with CPAP, hypertension, and hyperlipidemia who is currently admitted under involuntary commitment to Behavioral Medicine for possible suicidal ideation and major depression. Medicine was consulted for management of the patient's multiple medical problems and possible cellulitis of the left lower extremity. The patient reports that he has been on antibiotics, Augmentin, for the last three days and that the redness in his leg is much better. He denies any pain. He has chronic neuropathy and evidence of poor circulation including atrophic nails and skin with hair loss in his lower extremities. He has a right below-knee amputation. He denies any other problems including headache, lightheadedness, dizziness, cough, chest pain, shortness of breath, nausea, vomiting, diarrhea, or abdominal pain.   ALLERGIES: Shellfish.  CURRENT MEDICATIONS:  1. Tylenol 650 mg every six hours p.r.n.  2. Maalox 30 mL every 4 hours p.r.n. 3. Augmentin 875 mg daily.  4. Aspirin 81 mg daily.  5. Citalopram 40 mg daily.  6. Lasix 40 mg daily.  7. Neurontin 1200 mg three times daily.  8. Lantus 40 units subcutaneously twice a day. 9. Insulin sliding-scale. 10. Lisinopril 5 mg daily. 11. Milk of Magnesia 30 mL at bedtime p.r.n. constipation. 12. Zaroxolyn 2.5 mg daily.  13. Lopressor 50 mg daily.  14. Simvastatin 20 mg daily.  15. Trazodone 50 mg at bedtime.  16. Percocet 1 tablet every 8 hours p.r.n.   PAST MEDICAL HISTORY:  1. 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 2. Insulin resistance diabetes, on insulin. 3. Neuropathy. 4. Obstructive sleep apnea. The patient is noncompliant with CPAP. 5. Hypertension. 6. Hyperlipidemia. 7. Depression. 8. Chronic pain related to neuropathy and low back pain. 9. Chronic lower extremity ulceration, venostasis, and edema.   PAST SURGICAL HISTORY:  1. Right below-knee amputation. 2. Appendectomy.  3. Tonsillectomy.  4. On 05/27/2011 right fibular fracture and compartment syndrome status post repair. 5. Morbid obesity.  6. 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   SOCIAL HISTORY: The patient has been living in [facility] [place]. He is separated from his wife. He denies any alcohol, drug abuse, or smoking.  FAMILY HISTORY: Mother has hypertension. Father died of coronary artery disease, diabetes, and chronic kidney disease at the age of 75.  REVIEW OF SYSTEMS: CONSTITUTIONAL: The patient denies any fever, fatigue, or weakness. EYES: Denies any blurred or double vision. ENT: Denies any tinnitus or ear pain. . RESPIRATORY: Denies any cough or wheezing. CARDIOVASCULAR: Denies any chest pain or palpitations. GI: Denies any nausea, vomiting, diarrhea, or abdominal pain. GU: Denies any dysuria or hematuria. ENDOCRINE: Denies any polyuria or nocturia. HEME/LYMPH: Denies any anemia or easy bruisability. INTEGUMENT: Denies any acne or rash. MUSCULOSKELETAL: Denies any swelling or gout. NEUROLOGICAL: Denies any numbness or weakness. PSYCH: Has history of major depression.     PHYSICAL EXAMINATION:   VITAL SIGNS: Temperature 95.8, heart rate 79, respiratory rate 20, blood pressure 136/69.  GENERAL: The patient is a morbidly obese male sitting comfortably in bed, not in acute distress.   HEAD: Atraumatic, normocephalic.   EYES: No pallor, icterus or cyanosis. Pupils equal, round,  and reactive to light and accommodation. Extraocular movements intact.   ENT: Wet mucous  membranes. No oropharyngeal erythema or thrush.   NECK: Supple. No masses. No JVD. No thyromegaly or lymphadenopathy.   CHEST WALL: No tenderness to palpation. Not using accessory muscles of respiration. No intercostal muscle retractions.   LUNGS: Bilaterally clear to auscultation. No wheezing, rales, or rhonchi.   HEART: S1 and S2 regular. No murmurs, rubs, or gallops.   ABDOMEN: Soft, nontender, and nondistended. No guarding or rigidity. Organomegaly was difficult to evaluate because of the patient's body habitus. Normal bowel sounds.  SKIN: No rashes. On his left lower extremity, the patient has some chronic venostasis edema and changes related to neuropathy and poor circulation, and skin atrophy likely resulting from diabetic neuropathy and PAD. There is a small area of eschar on his left shin.   PERIPHERIES: There is some pedal edema, poorly palpable pulses. The patient's left big toenail is missing. The other nails are atrophic with very poor foot hygiene. On the right lower extremity, the patient has undergone below the knee amputation.   MUSCULOSKELETAL: No cyanosis or clubbing.   NEUROLOGICAL: Awake, alert, and oriented x3. Nonfocal neurological exam. Cranial nerves are grossly intact.   PSYCH: Normal mood and affect.  RESULTS: Fasting blood sugar is 199. CBC IS normal. Complete metabolic panel normal. Hemoglobin A1c 10.2.   ASSESSMENT AND PLAN: A 49 year old male with multiple medical problems including diabetes, hypertension, hyperlipidemia, and sleep apnea admitted to the Behavioral Medicine under involuntary commitment. Medicine is consulted for multiple medical problems and possible cellulitis. 1. Left lower extremity swelling and redness: The patient has changes of chronic venostasis and skin changes related to peripheral neuropathy and poor circulation likely resulting from poorly controlled diabetes. He has no active evidence of cellulitis. The patient has been on  antibiotics for the past three days. Recommend continuing Augmentin for another seven days and then stopping it. 2. Hypertension: Appears to be well controlled currently on current medication regimen.  3. Diabetes: The patient's hemoglobin A1c is 10.2. His diabetes is poorly controlled. Should continue Lantus at the current dose, insulin sliding scale, and ADA diet. We will restart NovoLog at a lower dose since the patient requires good control of his diabetes.  4. Diabetic neuropathy: Continue Neurontin.  5. Morbid obesity with sleep apnea: The patient does not want to use CPAP.  6. Hyperlipidemia: Continue statin therapy.  7. History of pulmonary hypertension and right heart and diastolic dysfunction. Continue diuretic therapy.   I reviewed all medical records and discussed with the patient the plan of care and management.   TIME SPENT:  75 minutes. ____________________________ Cherre Huger, MD sp:slb D: 05/21/2011 15:02:36 ET     T: 05/21/2011 15:27:56 ET       JOB#: [id number] cc: Cherre Huger, MD, <Dictator> Cherre Huger MD ELECTRONICALLY SIGNED 05/21/2011 16:06

## 2014-06-23 NOTE — Consult Note (Signed)
Brief Consult Note: Diagnosis: Major depressive disorder recurrent.   Patient was seen by consultant.   Consult note dictated.   Recommend further assessment or treatment.   Orders entered.   Discussed with Attending MD.   Comments: Mr. Sim Boast has a h/o depression. He was brought to ER by police from his adult care home for voicing suicidal and homicidal threats. The patient adamantly denies. He believes that the facility retaliated agains him for filing complaints. To his surprise, the facility will not accept this patient back.  PLAN: 1. The patient was [facility] by ER physician.  2. We are looking at placement options.  3. He will continue all medications as at the facility. I will increase Celexa to 40 mg.   4. He was not given insulin for the last day at the facility. We will restart it with SSI and Accuchecks.  5. The patient is an amputee. There is cellulitis in his good leg. He has been prescribed augmentin in the ER 3 days ago. He may need medicine consultation. Was osteomielitis ruled out?  6. Psychiatry will follow up.  Electronic Signatures: Orson Slick (MD)  (Signed 21-Mar-13 10:54)  Authored: Brief Consult Note   Last Updated: 21-Mar-13 10:54 by Orson Slick (MD)

## 2014-06-30 NOTE — Discharge Summary (Signed)
PATIENT NAME:  Russell [name], Russell [name] MR#:  [id number] DATE OF BIRTH:  1965/12/11  DATE OF ADMISSION:  06/06/2014 DATE OF DISCHARGE:  06/13/2014  ADMITTING COMPLAINT: Shortness of breath.   DISCHARGE DIAGNOSES:  1.  Left lower extremity cellulitis with ulceration.  2.  Acute diastolic congestive heart failure.  3.  Diabetes mellitus type 2, hemoglobin A1c 10.4.  4.  Obstructive sleep apnea, noncompliant with continuous positive airway pressure.  5.  Morbid obesity, body mass index 56.9.  6.  Hemorrhoids.  7.  Restless legs syndrome.  8.  Peripheral neuropathy due to diabetes.  9.  Depression.  10.  Chronic back pain.  11.  Chronic lower extremity edema.  12.  History of right below-the-knee amputation, now wheelchair bound.  13.  Methicillin-resistant Staphylococcus aureus skin infection during this hospitalization.   CONSULTATIONS:  1.  Cheral Marker. Ola Spurr, MD, infectious disease.  2.  Harrell Gave A. Lundquist, MD, surgery.  3.  Isaias Cowman, MD, cardiology.   PROCEDURES: 1.  Chest x-ray April 6: She was borderline cardiomegaly with similar pulmonary vascular congestion.  2.  A 2-D echocardiogram April 7 shows ejection fraction 60% to 65%; very challenging study due to body habitus. Normal global left ventricular systolic function. Mild left ventricular hypertrophy. Unable to exclude regional wall motion abnormalities. Grossly normal right ventricular size and systolic function. Grossly no significant valvular disease. Unable to estimate pulmonary artery pressures.  3.  Chest x-ray April 8 shows PICC line in satisfactory position. 4.  Left lower extremity foot x-ray shows no acute osseous injury, soft tissue swelling of the forefoot and great toe, soft tissue gas of the great toe related to nail bed. Clinical correlation recommended.   HISTORY OF PRESENT ILLNESS: This 49 year old gentleman with past medical history of congestive heart failure presents with shortness of breath and  orthopnea. He states that he has been out of his medications for approximately a month (H and P states 1 week, but during hospitalization it became apparent that he had been off of medications longer than this) due to financial difficulties. He states that since running out of medications, he has been retaining fluid. He also notes increasing size of his abdomen and left lower extremity with ulcerations.  HOSPITAL COURSE BY PROBLEM:  1.  Left lower extremity cellulitis: The patient had significant anasarca contributing to this cellulitis. On presentation, he was started on vancomycin. Aggressive diuresis was attempted during the hospitalization. Infectious disease was consulted. Dr. Ola Spurr recommended Louretta Parma wrapping. Continued vigilance with fluids. He did have a positive MRSA nasal screen on admission and wound cultures also grew MRSA. He is discharged on doxycycline 2 times a day for 10 days with a followup appointment in the infectious disease clinic 1 week after discharge. He does not have any systemic symptoms of infection. 2.  Acute on chronic diastolic congestive heart failure: A 2-D echocardiogram performed during this admission showed a normal ejection fraction. His obstructive sleep apnea and body habitus likely contribute to diastolic dysfunction. He reports having run out of his medications and being noncompliant with his diet which has led to this exacerbation. During the hospitalization, aggressive diuresis was attempted and he lost about 7 kg, 14 pounds, during his stay. He was followed by cardiology and Dr. Saralyn Pilar recommended continued diuresis, ACE inhibitor, and beta blocker. He stressed the importance of weight control and dietary compliance. He encouraged compliance with CPAP for sleep apnea and recommended possible pulmonology referral for mask fitting, as the patient states he is unable  to tolerate it. This patient may benefit from a heart failure clinic in the future.  3.  Diabetes  mellitus type 2 with hemoglobin A1c 10.4 during admission. The patient reports that he has been off of his long-acting insulin, Lantus, for about 1 month prior to admission. He was only able to take short-acting insulin, which did not control his blood sugars well. He is discharged on high doses of both long- and short-acting insulin, 80 units of Lantus once a day and 90 units of insulin aspart 3 times a day before meals. During hospitalization, this regimen controlled his blood sugars fairly well, with most readings being in the high 100s to 200 range. This patient needs continued dietary education and support in managing his diabetes.  4.  Obstructive sleep apnea: The patient is not compliant with CPAP machine due to inability to tolerate the mask. This is likely contributing to his worsening heart failure and overall health.  5.  Morbid obesity, BMI of 56.9: Some of this is fluid retention, but the majority is not due to fluids. The patient is mostly wheelchair bound. Given his age, I did discuss the possibility of bariatric surgery with him, as significant weight reduction may improve his quality of life and decrease severity of his other comorbidities. This can be discussed in the outpatient setting.   DISCHARGE EXAMINATION:  VITAL SIGNS: Temperature 97.5, pulse 81, respirations 18, blood pressure 106/70, oxygenation 92% on room air.  GENERAL: No acute distress.  CARDIOVASCULAR: Regular rate and rhythm. No murmurs, rubs, or gallops. He does have 1+ pitting edema over the left lower extremity. PULMONARY: Lungs are clear to auscultation bilaterally with good air movement.  SKIN: The left lower extremity has 1+ edema. Skin is mildly erythematous. He does have several ulcers which are leaking serous-type fluid. Toes are slightly discolored but do have good capillary refill.   LABORATORY DATA: Sodium 136, potassium 4.0, chloride 100, bicarbonate 29, BUN 22, creatinine 0.87, glucose 143. LFTs normal on  admission. Troponin negative on admission. White blood cells 10.3, hemoglobin 12.6, platelets 190,000. MCV is 80. Nasal swab and wound cultures are positive for MRSA.   DISCHARGE MEDICATIONS: 1.  Gabapentin 600 mg 1 tablet 3 times a day.  2.  Venlafaxine 150 mg 2 tablets once a day.  3.  Simvastatin 20 mg 1 tablet daily.  4.  Torsemide 100 mg 1 tablet once a day.  5.  Aspirin 81 mg 1 tablet once a day.  6.  Insulin glargine 100 units per mL, 80 units once a day.  7.  Insulin aspart 100 units per mL, 90 units subcutaneously 3 times a day before meals.  8.  Carvedilol 3.125, 1 tablet twice a day.  9.  Lisinopril 10 mg 1 tablet daily.  10.  Doxycycline monohydrate 100 mg 1 tablet twice a day for 10 days.    CONDITION ON DISCHARGE: Stable.   DISPOSITION: The patient is being discharged home with home health nursing, physical therapy, and wound care.   DISCHARGE INSTRUCTIONS:  1.  Diet: Low-sodium, low-fat, low-cholesterol, carbohydrate-controlled diet.  2.  Activity limitations: Activity is as tolerated.  3.  Case management has been working with the patient on access to medications, and this has been established. The patient is aware of resources for medication access.  4.  Timeframe for followup: Follow up in 1 to 2 weeks with Dr. Ola Spurr and in 1 to 2 weeks with your primary care doctor.   TIME SPENT ON DISCHARGE: 45  minutes.   ____________________________ Earleen Newport. Volanda Napoleon, MD cpw:ST D: 06/17/2014 21:24:37 ET T: 06/17/2014 22:35:35 ET JOB#: [id number]  cc: Barnetta Chapel P. Volanda Napoleon, MD, <Dictator> Aldean Jewett MD ELECTRONICALLY SIGNED 06/18/2014 15:08

## 2014-06-30 NOTE — Consult Note (Signed)
PATIENT NAME:  Russell [name], Russell [name] MR#:  [id number] DATE OF BIRTH:  11-09-1965  DATE OF CONSULTATION:  06/11/2014  REFERRING PHYSICIAN:   CONSULTING PHYSICIAN:  Siler Mavis A. Amere Bricco, MD  REASON FOR CONSULTATION:  Left lower extremity erythema and as well as history of prolapsing but reducible hemorrhoids.    HISTORY OF PRESENT ILLNESS:  Russell [name] is a 49 year old male who presented on 06/06/2014 with multiple comorbidities, but also left leg swelling, as well as 2 left lower extremity ulcers, which were new and likely started as bullae.  He says that he has minimal pain. It is always somewhat red but it is a little bit worse.  No fevers, chills, night sweats, shortness of breath, cough, chest pain, abdominal pain, nausea, vomiting, diarrhea, constipation, dysuria or hematuria.  Also he has a history of prolapsing hemorrhoids, which come out when he has a bowel movement usually spontaneously reduce.  Occasionally he does have push them in.  No  pain or bleeding.   PAST MEDICAL HISTORY:  Congestive heart failure, obesity, sleep apnea, restless leg syndrome, depression, and chronic back pain, diabetes mellitus with neuropathy, history of right BKA, hypertension, chronic anemia.   MEDICATIONS:  Effexor, Zocor, K-Dur, lisinopril, Humulin N, Humulin R, gabapentin, aspirin, torsemide.   PAST MEDICAL HISTORY:  History of appendectomy, tonsillectomy, right below-knee amputation as well as surgeries on his right leg.   ALLERGIES: SHELLFISH.   FAMILY HISTORY:  Coronary artery disease, diabetes, cancer,  kidney failure.   SOCIAL HISTORY:  Denies tobacco or alcohol use.   REVIEW OF SYSTEMS:  Obtained and positives and negatives are above.   PHYSICAL EXAMINATION:  VITAL SIGNS:  Temperature is 98.2, pulse 82, blood pressure 116/74, respirations 18 and 97% on room air.  GENERAL:  No acute distress.  Alert and oriented x 3.  HEENT:  Normocephalic, atraumatic.  Eyes:  No scleral icterus.  No  conjunctivitis.  Face:  No obvious facial trauma.   Normal external nose.  Normal external ears.  CHEST:  Lungs clear to auscultation.  Moving air well.  HEART:  Regular rate and rhythm.  No murmurs, rubs or gallops.  ABDOMEN:  Soft, nontender and nondistended.  EXTREMITIES:  Has 1 small ulcer at the posterior dorsum of his left foot as well as 1 on the lateral aspect of his calf.  Does have significant redness.  No obvious drainable fluid collections. Sensation intact.  NEUROLOGIC:  Cranial nerves II through XII grossly intact.   ANUS:  Does have some small external hemorrhoids, which are non-prolapsing with Valsalva.     LABORATORY DATA:  As follows:  White cell count yesterday was 9.5 with 70% neutrophils. Renal function is 0.8, blood sugars are in the 240s-250s over the last 3 time points.     ASSESSMENT AND PLAN:  Russell [name] is a pleasant 49 year old male with cellulitis of the left leg.  No drainable fluid collections.  Continue current wound care and antibiotics.  Hemorrhoids are grade 2, possibly grade 3 at times, no obvious surgical indication as surgery is quite painful and they are subjected to bleeding as well and the risk would outweigh the benefits.  I have advised him if the hemorrhoids ever do stick out or become painful that he can come see me, if they do stick out acutely and are unable to be reduced and become very painful, he should go to the Emergency Room for possible incarcerated hemorrhoids.  In addition, he does complain of an umbilical hernia, which he said  is reducible.  I have also warned him to go the Emergency Room if this becomes tender and nonreducible.  Otherwise, no acute surgical issues for both things.     ____________________________ Glena Norfolk Russell Bugaj, MD cal:NT D: 06/11/2014 17:34:00 ET T: 06/11/2014 17:57:53 ET JOB#: [id number]  Harrell Gave A Zayleigh Stroh MD ELECTRONICALLY SIGNED 06/19/2014 12:47

## 2014-06-30 NOTE — Consult Note (Signed)
PATIENT NAME:  Russell [name], Russell [name] MR#:  [id number] DATE OF BIRTH:  1965-08-14  DATE OF CONSULTATION:  06/10/2014  REFERRING PHYSICIAN:   CONSULTING PHYSICIAN:  Isaias Cowman, MD  PRIMARY CARE PHYSICIAN:  Valera Castle, MD  CHIEF COMPLAINT: Shortness of breath.   REASON FOR CONSULTATION: Consultation requested for evaluation of diastolic congestive heart failure.   HISTORY OF PRESENT ILLNESS: The patient is a 49 year old gentleman with history of morbid obesity, hypertension, diabetes, sleep apnea, and chronic diastolic congestive heart failure. The patient reports that he was in his usual state of health until 2 to 3 months ago, when he had difficulty paying for his medications. The patient presents with a 1 to 2 week history of fluid retention, peripheral edema, shortness of breath. The patient has a history of right BKA. He has had nonhealing ulcers in his left lower extremity and presented with evidence for cellulitis. In addition, the patient had not been taking his diuretics and has experienced fluid retention.   Admission labs were notable for elevated serum glucose of 282. Troponin was negative. The patient has been undergoing diuresis with overall clinical improvement. The patient experienced significant diuresis last evening. The patient has a history of sleep apnea, but is unable to tolerate CPAP.   PAST MEDICAL HISTORY: 1.  Chronic diastolic congestive heart failure.  2.  Hypertension.  3.  Hyperlipidemia.  4.  Type 2 diabetes.  5.  Morbid obesity.  6.  Status post BKA.  7.  Sleep apnea.  8.  Diabetic neuropathy.   MEDICATIONS: That were prescribed by his primary care physician that the patient had not been taking include, furosemide 40 mg b.i.d., lisinopril 5 mg daily, potassium chloride 20 mEq daily, Humulin R  t.i.d., Lantus 50 units every evening, simvastatin 20 mg every evening, gabapentin 200 mg t.i.d., Atrovent 2 sprays daily, Effexor 1 capsule b.i.d.   SOCIAL  HISTORY: The patient is married, resides with his wife, he denies tobacco abuse.   FAMILY HISTORY: No immediate family history of coronary artery disease or myocardial infarction.   REVIEW OF SYSTEMS:  CONSTITUTIONAL: The patient does have chills at night when he awakens from sleep.  EYES: No blurry vision.  EARS: No hearing loss.  RESPIRATORY: The patient has chronic exertional dyspnea.  CARDIOVASCULAR: No chest pain.  GASTROINTESTINAL: No nausea, vomiting, or diarrhea. The patient does have external hemorrhoids.  GENITOURINARY: No dysuria or hematuria.  ENDOCRINE: No polyuria or polydipsia.  MUSCULOSKELETAL: No arthralgias or myalgias.  NEUROLOGICAL: No focal muscle use or numbness.  PSYCHOLOGICAL: No depression or anxiety.   PHYSICAL EXAMINATION: VITAL SIGNS: Blood pressure 119/64, pulse 90, respirations 18, temperature 98.2, pulse oximetry 94%.  HEENT: Pupils equal, reactive to light and accommodation.  NECK: Supple without thyromegaly.  LUNGS: Clear.  HEART: Normal JVP, normal PMI, regular rate and rhythm, normal S1, S2, no appreciable gallop, murmur, or rub.  ABDOMEN: Soft and nontender. Pulses were intact.  EXTREMITIES: Patient has a right BKA.  NEUROLOGIC: The patient is alert and oriented x 3. Motor and sensory were both grossly intact.   IMPRESSION: A 49 year old gentleman with chronic diastolic congestive heart failure, multifactorial secondary to morbid obesity, diabetes, hypertension, and sleep apnea with recent history of fluid retention, likely due to dietary as well as medical noncompliance. The patient had not been taking his medications including diuretics.   RECOMMENDATIONS: 1.  Agree with overall current therapy.  2.  Continue lisinopril and carvedilol for hypertension control.  3.  Continue diuresis.  4.  I had a lengthy discussion with the patient, stressed the importance of weight control as well as low-sodium diet.  5.  Patient may benefit from CPAP for sleep  apnea. The patient reports he is unable to tolerate it, may need adjustments.  6.  Patient may benefit from heart failure clinic to help manage fluid balance as outpatient.    ____________________________ Isaias Cowman, MD ap:nt D: 06/10/2014 18:03:45 ET T: 06/10/2014 18:44:04 ET JOB#: [id number]  cc: Isaias Cowman, MD, <Dictator> Isaias Cowman MD ELECTRONICALLY SIGNED 06/25/2014 17:17

## 2014-06-30 NOTE — H&P (Signed)
PATIENT NAME:  Russell [name] MR#:  [id number] DATE OF BIRTH:  01-13-1966  DATE OF ADMISSION:  06/06/2014  CHIEF COMPLAINT: Shortness of breath.   HISTORY OF PRESENT ILLNESS: This is a 49 year old gentleman with significant comorbid medical history has known CHF and presents tonight with progressive shortness of breath and orthopnea. He states that he has been out of his diuretics and most of his medications for about a week and a half or longer now due to financial difficulty in obtaining these medicines. He states that it got to the point that he felt fluid was beginning to build up in his belly and that is when he decided he needed to come. He states that the only medication he has been able to use recently is his Humulin-R insulin because that is the one that opted to pay for. Most of the rest of his medications he has not had because he has run out of them and cannot afford to buy them. He also states that he has left lower extremity tenderness and swelling and he feels like he may have an infection there was well. He does have 2 left lower extremity ulcers as well, one on his heel and one on his leg knee. These have not had any active drainage; however, he does have significant increased erythema in the entire lower region of his left lower extremity. He denies any fever, nausea, vomiting, chills, chest pain; see full review of systems below. Hospitalists were called for admission for CHF exacerbation.   PRIMARY CARE PHYSICIAN: Nonlocal, states that it is. Dr. Kym [name] in [place], [place].   PAST MEDICAL HISTORY: Congestive heart failure, sleep apnea, restless leg syndrome, depression, chronic back pain, diabetes mellitus with neuropathy, hypertension, chronic lower extremity edema.   MEDICATIONS: Venlafaxine extended release 150 mg b.i.d. (though this is likely daily dosage), simvastatin 20 mg daily, K-Dur 20 mg b.i.d., lisinopril 10 mg daily, Humulin-N 80 units at bedtime, Humulin-R 8 units  t.i.d., gabapentin 600 mg t.i.d., aspirin 325 mg daily, torsemide 100 mg daily.   PAST SURGICAL HISTORY: Appendectomy, tonsillectomy, right tibia-fibular repair, 2 right toe amputations, right knee surgery, right below the knee amputation.   ALLERGIES: SHELLFISH GIVES HIM A RASH.   FAMILY HISTORY: CAD, diabetes mellitus, cancer, kidney failure.   SOCIAL HISTORY: He is a nonsmoker, nondrinker, and denies illicit drug use.   REVIEW OF SYSTEMS:  CONSTITUTIONAL: Denies fever, fatigue, weakness.  EYES: Denies blurred or double vision, pain or redness.  EAR, NOSE, AND THROAT: Denies ear pain, hearing loss, difficulty swallowing.  RESPIRATORY: Endorses mild cough without production. Endorses dyspnea. Denies COPD. Denies wheeze.  CARDIOVASCULAR: Denies chest pain. Endorses orthopnea. Endorses edema. Denies palpitations.  GASTROINTESTINAL: Denies nausea, vomiting, diarrhea, abdominal pain, consultation.  GENITOURINARY: Denies dysuria, hematuria, frequency.  ENDOCRINE: Denies nocturia, thyroid problems, heat or cold intolerance.  HEMATOLOGIC AND LYMPHATIC: Denies anemia, easy bruising, bleeding.  INTEGUMENTARY: Denies acne. Endorses left lower extremity erythema without true rash in that area. Endorses 2 left lower extremity skin ulcerations.  MUSCULOSKELETAL: Denies acute arthritis, joint swelling, or gout.  NEUROLOGICAL: Denies numbness, weakness, headache.  PSYCHIATRIC: Denies anxiety, insomnia. Has depression, but it is stable.   PHYSICAL EXAMINATION: VITAL SIGNS: Blood pressure 137/72, pulse 80, temperature 98.2, respirations 14 with 98% oxygen saturation on 2 L oxygen via nasal cannula.  GENERAL: This is a morbidly obese gentleman sitting up in bed with oxygen in place in no acute distress at the time of interview.  HEENT: Pupils equal, round,  and reactive to light and accommodation. Extraocular movements intact. No scleral icterus. Moist mucosal membranes.  NECK: Thyroid is difficult to  examine. Neck is supple, nontender. No cervical adenopathy palpated. JVD is difficult to examine.  RESPIRATORY: Lungs clear to auscultation bilaterally with no rales, rhonchi, or wheezes. No significant respiratory distress.  CARDIOVASCULAR: Regular rate and rhythm. No murmurs, rubs, or gallops on exam. He has 2+ left lower extremity edema.  ABDOMEN: Obese, soft, and nontender with good bowel sounds.  MUSCULOSKELETAL: Muscular strength 5/5 in all of his extremities. He does have a right below the knee amputation. He has no distal cyanosis or clubbing.  SKIN: He has significant erythema on his left lower extremity distally from about mid shin down, more anteriorly than posteriorly. He does have a lesion in his left lateral anterior shin as well as a heel lesion on that left lower extremity. No other rashes  LYMPHATIC: No adenopathy.  NEUROLOGICAL: Cranial nerves intact. Sensation intact throughout. No dysarthria or aphasia.  PSYCHIATRIC: Alert and oriented x 3, cooperative, not confused or agitated.   LABORATORY DATA: Largely unimpressive. White count 8.8, hemoglobin 13.9, hematocrit 43.2, platelets 232,000. Sodium 135, potassium 4.4, chloride 100, CO2 of 28, BUN 19, creatinine 0.89, glucose 282. BNP 10, calcium 8.9. Total protein 7.3, albumin 3.5, total bilirubin 0.3, alkaline phosphatase 111, AST 28, ALT 24. Troponin less than 0.03. INR 0.9.   RADIOLOGY: Chest x-ray shows borderline cardiomegaly with some lower pulmonary vascular congestion. No pleural effusion or focal consolidation. The patient does not have a recent echocardiogram in the chart that could be found on chart review.   ASSESSMENT AND PLAN:  1.  Acute exacerbation of chronic congestive heart failure: We will diuresis this patient. His heart failure does not seem to be extremely active right now given his on lab findings with a normal. BNP. His chest x-ray is mildly suggestive of some heart failure and he certainly has symptoms  consistent with some heart failure, especially including his weight gain, being off his diuretics. We will use intravenous Bumex at this time. The patient has a history of being on high doses of Lasix in the past and probably has Lasix intolerance now. He will likely be able to switch back to his p.o. torsemide relatively quickly given his current status. We will continue his oxygen as needed and once he no longer needs it during the day he can still continue to use it likely at night, as he has sleep apnea, see below.   2.  Left lower extremity cellulitis: We will start the patient on vancomycin. He definitely has warmth and erythema and that left lower extremity appears cellulitic and has 2 wounds, see below for treatment of this problem. Diuresing and removing some of the fluid from his leg will likely help with this problem as well. 3.  Left lower extremity ulcerations: We will get a wound care consult to manage the 2 left lower extremity ulcers and use antibiotics as above.  therapies and she would.  4.  Diabetes mellitus with neuropathy: This is likely uncontrolled, as the patient is not being able to afford all of his insulin regimens. We will get an A1c here to check it. His insulins are not on formulary here, so we will use sliding scale insulin to start with, but he will likely need escalate to long-acting and mealtime insulins while here in the hospital. We will do a diabetic diet. For his diabetic neuropathy, we will continue his home gabapentin.  5.  Sleep apnea: The patient, at one point in the past, was on oxygen 3 L at night for this, as he is not able to use CPAP due to sensation of claustrophobia. He is on oxygen right now overall for his acute exacerbation of congestive heart failure and shortness of breath. Once he is able to wean off his oxygen during the day, he can continue 3 L at night for his sleep apnea.  6.  Hypertension: The patient has a history of this. His blood pressure is fairly  well controlled right now. We will continue his home antihypertensives.  7.  Deep vein thrombosis prophylaxis: Subcutaneous Lovenox.   CODE STATUS: This patient is full code.   TIME SPENT ON THIS ADMISSION: 50 minutes.    ____________________________ Russell Corner. Jannifer Franklin, MD dfw:bm D: 06/06/2014 01:23:25 ET T: 06/06/2014 01:53:27 ET JOB#: [id number]  cc: Russell Corner. Jannifer Franklin, MD, <Dictator> Tiffancy Moger Fawn Kirk MD ELECTRONICALLY SIGNED 06/06/2014 3:33

## 2014-06-30 NOTE — Consult Note (Addendum)
PATIENT NAME:  CAROLYN, CHUANG MR#:  [id number] DATE OF BIRTH:  May 10, 1965  DATE OF CONSULTATION:  06/11/2014  REFERRING PHYSICIAN:   CONSULTING PHYSICIAN:  Cheral Marker. Ola Spurr, MD  REQUESTING PHYSICIAN:  Nicholes Mango, MD   REASON FOR CONSULTATION:  Left lower extremity cellulitis.   HISTORY OF PRESENT ILLNESS: This is a 49 year old gentleman with a 30 year history of   diabetes complicated by right lower extremity infection, status post right BKA. He also has a history of diastolic CHF and sleep apnea. He was admitted April 7 after he had not been taking his diuretics. He had massive edema at the time as well as evidence of redness and swelling in his left lower extremity. The patient has been admitted and treated with IV antibiotics as well as diuresis with IV Lasix. Today he was changed to oral Lasix and is not urinating as much. He continues to have redness and his toes are now somewhat purple color. He has minimal pain at the site. He has not had weeping. He has had no fevers or chills.   PAST MEDICAL HISTORY:  1. Diabetes, long-standing, complicated by right BKA as well as neuropathy.  2. CHF, appears to be diastolic.  3. Sleep apnea.  4. Restless leg syndrome.  5. Depression.  6. Chronic back pain.  7. Hypertension.  8. Chronic lower extremity edema.   PAST SURGICAL HISTORY: Appendectomy, tonsillectomy, right tibia-fibula repair followed by  right below the knee amputation.   ALLERGIES: SHELLFISH.   ANTIBIOTICS SINCE ADMISSION INCLUDE:  Ceftriaxone, April 06 through 11, and vancomycin April 6 through present.   REVIEW OF SYSTEMS:  Eleven systems reviewed and negative except as per HPI.   SOCIAL HISTORY: Lives with his wife. He is on disability.   Does not smoke, drink, or use alcohol.   FAMILY HISTORY: Noncontributory.   PHYSICAL EXAMINATION: VITAL SIGNS: Temperature 98.1, pulse 88, blood pressure 122/74, respirations 18, saturation 98% on room air.  GENERAL: He is pleasant,  interactive, morbidly obese. He is sitting in his motorized wheelchair.  HEENT: Pupils reactive. Sclerae anicteric. Oropharynx clear.  NECK: Supple.  HEART: Regular but very distant heart sounds.  LUNGS: Clear.  ABDOMEN: Massively obese.  EXTREMITIES: His right BKA site is well healed. Left lower extremity has massive edema which is quite tense. There is erythema associated with it. There is a small area of ulceration anteriorly. His toes are somewhat bluish discoloration and he has very poor capillary refill.   DATA: White count on April 6 was 8.8, currently 9.5, hemoglobin 13.0, platelets 205,000. MRSA screen was positive. No other culture data are available. Renal function shows BUN 24, creatinine 0.82, sodium level 132, potassium 4.2. LFTs are normal. Troponins were negative. BNP is 10.   IMAGING: Chest x-ray April 6 showed borderline cardiomegaly with pulmonary vascular congestion.   IMPRESSION: A 49 year old, morbid obesity, poorly controlled diabetes status post right below knee amputation, as well as diastolic congestive heart failure who ran out of his meds and was admitted with massive edema as well as cellulitis of his left lower extremity. He has been diuresed approximately 10 pounds with IV Lasix, but the edema persists. There is impressive erythema as well as very poor capillary refill. He has not had fevers or a white count.   I think the main issue here is his edema and not active overwhelming infection. He does have a positive methicillin-resistant Staphylococcus aureus nasal screen, but no other cultures are available.   RECOMMENDATIONS: 1. I  have advised the patient he needs to elevate his legs with at least 6 pillows to decrease the edema and allow improved arterial inflow. I have discussed this with the nurse as well.  2. I would suggest continuing aggressive diuresis over the next several days with IV Lasix. I have taken the liberty to place him back on Lasix 60 mg IV b.i.d.  to which he seemed to respond. As an outpatient, he would definitely need to continue diuretics as well as possibly add Zaroxolyn to control edema.  3. If the leg continues to improve, he can be backed off of his antibiotics and changed to orals. He may also benefit from an Unna wrap at discharge. 4. I would suggest working aggressively over the next few days on the edema in order to prevent worsening and recurrent admission.   Thanks  for the consult. I will be glad to follow with you.    ____________________________ Cheral Marker. Ola Spurr, MD dpf:tr D: 06/11/2014 15:59:04 ET T: 06/11/2014 16:55:04 ET JOB#: [id number]  cc: Cheral Marker. Ola Spurr, MD, <Dictator> Danell Verno Ola Spurr MD ELECTRONICALLY SIGNED 07/02/2014 10:17

## 2014-10-09 ENCOUNTER — Ambulatory Visit: Payer: Medicare Other | Admitting: Surgery

## 2014-11-20 ENCOUNTER — Emergency Department
Admission: EM | Admit: 2014-11-20 | Discharge: 2014-11-21 | Disposition: A | Discharge status: Home / Self Care | Payer: Medicare Other | Attending: Internal Medicine | Admitting: Internal Medicine

## 2014-11-20 ENCOUNTER — Encounter: Payer: Self-pay | Admitting: Emergency Medicine

## 2014-11-20 DIAGNOSIS — E119 Type 2 diabetes mellitus without complications: Secondary | ICD-10-CM

## 2014-11-20 DIAGNOSIS — E1165 Type 2 diabetes mellitus with hyperglycemia: Secondary | ICD-10-CM | POA: Diagnosis not present

## 2014-11-20 DIAGNOSIS — I10 Essential (primary) hypertension: Secondary | ICD-10-CM | POA: Diagnosis not present

## 2014-11-20 DIAGNOSIS — F32A Depression, unspecified: Secondary | ICD-10-CM

## 2014-11-20 DIAGNOSIS — R739 Hyperglycemia, unspecified: Secondary | ICD-10-CM

## 2014-11-20 DIAGNOSIS — F4321 Adjustment disorder with depressed mood: Secondary | ICD-10-CM | POA: Diagnosis not present

## 2014-11-20 DIAGNOSIS — F919 Conduct disorder, unspecified: Secondary | ICD-10-CM | POA: Diagnosis present

## 2014-11-20 DIAGNOSIS — F607 Dependent personality disorder: Secondary | ICD-10-CM

## 2014-11-20 DIAGNOSIS — F329 Major depressive disorder, single episode, unspecified: Secondary | ICD-10-CM | POA: Insufficient documentation

## 2014-11-20 DIAGNOSIS — Z659 Problem related to unspecified psychosocial circumstances: Secondary | ICD-10-CM

## 2014-11-20 HISTORY — DX: Type 2 diabetes mellitus without complications: E11.9

## 2014-11-20 HISTORY — DX: Major depressive disorder, single episode, unspecified: F32.9

## 2014-11-20 HISTORY — DX: Depression, unspecified: F32.A

## 2014-11-20 HISTORY — DX: Essential (primary) hypertension: I10

## 2014-11-20 LAB — GLUCOSE, CAPILLARY
GLUCOSE-CAPILLARY: 321 mg/dL — AB (ref 65–99)
Glucose-Capillary: 424 mg/dL — ABNORMAL HIGH (ref 65–99)
Glucose-Capillary: 326 mg/dL — ABNORMAL HIGH (ref 65–99)

## 2014-11-20 LAB — CBC WITH DIFFERENTIAL/PLATELET
BASOS ABS: 0 10*3/uL (ref 0–0.1)
Basophils Relative: 0 %
Eosinophils Absolute: 0.1 10*3/uL (ref 0–0.7)
Eosinophils Relative: 1 %
HEMATOCRIT: 41.8 % (ref 40.0–52.0)
Hemoglobin: 13.7 g/dL (ref 13.0–18.0)
LYMPHS ABS: 1.4 10*3/uL (ref 1.0–3.6)
LYMPHS PCT: 18 %
MCH: 25.4 pg — ABNORMAL LOW (ref 26.0–34.0)
MCHC: 32.8 g/dL (ref 32.0–36.0)
MCV: 77.6 fL — AB (ref 80.0–100.0)
MONO ABS: 0.4 10*3/uL (ref 0.2–1.0)
Monocytes Relative: 5 %
NEUTROS ABS: 6.1 10*3/uL (ref 1.4–6.5)
Neutrophils Relative %: 76 %
Platelets: 211 10*3/uL (ref 150–440)
RBC: 5.38 MIL/uL (ref 4.40–5.90)
RDW: 17.2 % — AB (ref 11.5–14.5)
WBC: 7.9 10*3/uL (ref 3.8–10.6)

## 2014-11-20 LAB — URINE DRUG SCREEN, QUALITATIVE (ARMC ONLY)
AMPHETAMINES, UR SCREEN: NOT DETECTED
Barbiturates, Ur Screen: NOT DETECTED
Benzodiazepine, Ur Scrn: NOT DETECTED
COCAINE METABOLITE, UR ~~LOC~~: NOT DETECTED
Cannabinoid 50 Ng, Ur ~~LOC~~: NOT DETECTED
MDMA (ECSTASY) UR SCREEN: NOT DETECTED
Methadone Scn, Ur: NOT DETECTED
OPIATE, UR SCREEN: NOT DETECTED
PHENCYCLIDINE (PCP) UR S: NOT DETECTED
Tricyclic, Ur Screen: NOT DETECTED

## 2014-11-20 LAB — COMPREHENSIVE METABOLIC PANEL
ALBUMIN: 3.6 g/dL (ref 3.5–5.0)
ALT: 21 U/L (ref 17–63)
AST: 26 U/L (ref 15–41)
Alkaline Phosphatase: 101 U/L (ref 38–126)
Anion gap: 10 (ref 5–15)
BUN: 18 mg/dL (ref 6–20)
CHLORIDE: 96 mmol/L — AB (ref 101–111)
CO2: 29 mmol/L (ref 22–32)
Calcium: 9.1 mg/dL (ref 8.9–10.3)
Creatinine, Ser: 0.98 mg/dL (ref 0.61–1.24)
GFR calc Af Amer: >60 mL/min (ref >60)
Glucose, Bld: 386 mg/dL — ABNORMAL HIGH (ref 65–99)
POTASSIUM: 4.7 mmol/L (ref 3.5–5.1)
SODIUM: 135 mmol/L (ref 135–145)
Total Bilirubin: 0.5 mg/dL (ref 0.3–1.2)
Total Protein: 7.7 g/dL (ref 6.5–8.1)

## 2014-11-20 LAB — ETHANOL

## 2014-11-20 MED ORDER — SODIUM CHLORIDE 0.9 % IV SOLN
Freq: Once | INTRAVENOUS | Status: AC
  Administered 2014-11-20: 15:00:00 via INTRAVENOUS

## 2014-11-20 MED ORDER — INSULIN GLARGINE 100 UNIT/ML ~~LOC~~ SOLN
6.0000 [IU] | Freq: Once | SUBCUTANEOUS | Status: AC
  Administered 2014-11-20: 6 [IU] via SUBCUTANEOUS
  Filled 2014-11-20: qty 0.06

## 2014-11-20 NOTE — ED Notes (Signed)
BEHAVIORAL HEALTH ROUNDING Patient sleeping: Yes.   Patient alert and oriented: yes Behavior appropriate: Yes.  ; If no, describe:  Nutrition and fluids offered: Yes  Toileting and hygiene offered: Yes  Sitter present: not applicable Law enforcement present: Yes  

## 2014-11-20 NOTE — ED Provider Notes (Signed)
-----------------------------------------   7:25 PM on 11/20/2014 -----------------------------------------  Patient states that he is here because he is depressed and not taking his medication. We are waiting up and from counselors here. He is in no acute distress. Our saturation 98%. We do not this sugar remains high which is not surprising. I'll give him insulin for this. We will reassess closely.  Schuyler Amor, MD 11/20/14 [phone]

## 2014-11-20 NOTE — ED Notes (Signed)
CBG 424 

## 2014-11-20 NOTE — ED Notes (Signed)
Pt states he can't take care of himself anymore, is here for depression, has not been taking his medication for some time now. Pt tearful in triage. Denies si/hi. Here for evaluation to go to [facility] care facility for long term care.

## 2014-11-20 NOTE — ED Provider Notes (Signed)
[facility] Emergency Department Provider Note     Time seen: ----------------------------------------- 1:58 PM on 11/20/2014 -----------------------------------------    I have reviewed the triage vital signs and the nursing notes.   HISTORY  Chief Complaint Mental Health Problem    HPI Russell [name] is a 49 y.o. male who presents to ER stating he can't take care of himself anymore. He is here for depression, is not taking his medication for some time now. He is tearful, does not want to hurt himself or anyone else. He is here for possible long-term care placement. States she's lost everything and everyone. He's not been controlling his blood sugars because he is out of his insulin.   Past Medical History  Diagnosis Date  . Diabetes mellitus without complication   . Hypertension   . Depression     There are no active problems to display for this patient.   Past Surgical History  Procedure Laterality Date  . Below knee leg amputation      Right leg    Allergies Shellfish allergy  Social History Social History  Substance Use Topics  . Smoking status: Never Smoker   . Smokeless tobacco: None  . Alcohol Use: No    Review of Systems Constitutional: Negative for fever. Eyes: Negative for visual changes. ENT: Negative for sore throat. Cardiovascular: Negative for chest pain. Respiratory: Negative for shortness of breath. Gastrointestinal: Negative for abdominal pain, vomiting and diarrhea. Genitourinary: Negative for dysuria. Musculoskeletal: Negative for back pain. Skin: Negative for rash. Neurological: Negative for headaches, focal weakness or numbness.  10-point ROS otherwise negative.  ____________________________________________   PHYSICAL EXAM:  VITAL SIGNS: ED Triage Vitals  Enc Vitals Group     BP 11/20/14 1342 127/67 mmHg     Pulse Rate 11/20/14 1342 79     Resp 11/20/14 1342 20     Temp 11/20/14 1342 98.2 F  (36.8 C)     Temp Source 11/20/14 1342 Oral     SpO2 11/20/14 1342 96 %     Weight 11/20/14 1342 383 lb (173.728 kg)     Height 11/20/14 1342 5\' 11"  (1.803 m)     Head Cir --      Peak Flow --      Pain Score 11/20/14 1343 9     Pain Loc --      Pain Edu? --      Excl. in [place]? --     Constitutional: Alert and oriented. Well appearing and in no distress. Eyes: Conjunctivae are normal. PERRL. Normal extraocular movements. ENT   Head: Normocephalic and atraumatic.   Nose: No congestion/rhinnorhea.   Mouth/Throat: Mucous membranes are moist.   Neck: No stridor. Cardiovascular: Normal rate, regular rhythm. Normal and symmetric distal pulses are present in all extremities. No murmurs, rubs, or gallops. Respiratory: Normal respiratory effort without tachypnea nor retractions. Breath sounds are clear and equal bilaterally. No wheezes/rales/rhonchi. Gastrointestinal: Soft and nontender. No distention. No abdominal bruits.  Musculoskeletal: Nontender with normal range of motion in all extremities. No joint effusions.  No lower extremity tenderness nor edema. Neurologic:  Normal speech and language. No gross focal neurologic deficits are appreciated. Speech is normal. No gait instability. Skin:  Skin is warm, dry and intact. No rash noted. Psychiatric: Mood and affect are normal. Speech and behavior are normal. Patient exhibits appropriate insight and judgment.  ____________________________________________  ED COURSE:  Pertinent labs & imaging results that were available during my care of the patient were  reviewed by me and considered in my medical decision making (see chart for details). We'll check basic labs, given normal saline bolus.. ____________________________________________    LABS (pertinent positives/negatives)  Labs Reviewed  COMPREHENSIVE METABOLIC PANEL  ETHANOL  CBC WITH DIFFERENTIAL/PLATELET  URINE DRUG SCREEN, QUALITATIVE ([facility] ONLY)  CBG MONITORING, ED    ____________________________________________  FINAL ASSESSMENT AND PLAN  Major depressive episode  Plan: Patient with labs as dictated above. Patient is currently pending psychiatric evaluation. Patient has received a saline bolus for his hyperglycemia. He'll be restarted on his insulin regimen.   Earleen Newport, MD   Earleen Newport, MD 11/20/14 [phone]

## 2014-11-20 NOTE — ED Notes (Signed)
BEHAVIORAL HEALTH ROUNDING Patient sleeping: Yes.   Patient alert and oriented: yes Behavior appropriate: Yes.  ; If no, describe:  Nutrition and fluids offered: Yes  Toileting and hygiene offered: Yes  Sitter present: yes Law enforcement present: Yes  

## 2014-11-21 DIAGNOSIS — Z659 Problem related to unspecified psychosocial circumstances: Secondary | ICD-10-CM

## 2014-11-21 DIAGNOSIS — F4321 Adjustment disorder with depressed mood: Secondary | ICD-10-CM

## 2014-11-21 DIAGNOSIS — F607 Dependent personality disorder: Secondary | ICD-10-CM

## 2014-11-21 DIAGNOSIS — E119 Type 2 diabetes mellitus without complications: Secondary | ICD-10-CM

## 2014-11-21 LAB — GLUCOSE, CAPILLARY: Glucose-Capillary: 362 mg/dL — ABNORMAL HIGH (ref 65–99)

## 2014-11-21 MED ORDER — GABAPENTIN 600 MG PO TABS
600.0000 mg | ORAL_TABLET | Freq: Three times a day (TID) | ORAL | Status: DC
  Administered 2014-11-21: 600 mg via ORAL
  Filled 2014-11-21 (×2): qty 1

## 2014-11-21 MED ORDER — INSULIN NPH (HUMAN) (ISOPHANE) 100 UNIT/ML ~~LOC~~ SUSP
20.0000 [IU] | Freq: Every day | SUBCUTANEOUS | Status: DC
  Filled 2014-11-21: qty 10

## 2014-11-21 MED ORDER — INSULIN REGULAR HUMAN 100 UNIT/ML IJ SOLN
30.0000 [IU] | Freq: Three times a day (TID) | INTRAMUSCULAR | Status: DC
  Filled 2014-11-21 (×3): qty 0.3

## 2014-11-21 MED ORDER — CARVEDILOL 6.25 MG PO TABS: 3.1250 mg | ORAL_TABLET | Freq: Two times a day (BID) | ORAL | Status: DC

## 2014-11-21 MED ORDER — BUSPIRONE HCL 15 MG PO TABS
7.5000 mg | ORAL_TABLET | Freq: Three times a day (TID) | ORAL | Status: DC
  Filled 2014-11-21 (×2): qty 1

## 2014-11-21 MED ORDER — INSULIN ASPART 100 UNIT/ML ~~LOC~~ SOLN
30.0000 [IU] | Freq: Three times a day (TID) | SUBCUTANEOUS | Status: DC
  Administered 2014-11-21: 30 [IU] via SUBCUTANEOUS
  Filled 2014-11-21: qty 30

## 2014-11-21 MED ORDER — ASPIRIN EC 81 MG PO TBEC
81.0000 mg | DELAYED_RELEASE_TABLET | Freq: Every day | ORAL | Status: DC
  Administered 2014-11-21: 81 mg via ORAL
  Filled 2014-11-21: qty 1

## 2014-11-21 MED ORDER — METOLAZONE 2.5 MG PO TABS
2.5000 mg | ORAL_TABLET | Freq: Every day | ORAL | Status: DC
  Filled 2014-11-21: qty 1

## 2014-11-21 MED ORDER — INSULIN NPH (HUMAN) (ISOPHANE) 100 UNIT/ML ~~LOC~~ SUSP
30.0000 [IU] | Freq: Every day | SUBCUTANEOUS | Status: DC
  Filled 2014-11-21: qty 10

## 2014-11-21 MED ORDER — INSULIN DETEMIR 100 UNIT/ML ~~LOC~~ SOLN
20.0000 [IU] | Freq: Every day | SUBCUTANEOUS | Status: DC
  Filled 2014-11-21: qty 0.2

## 2014-11-21 MED ORDER — INSULIN DETEMIR 100 UNIT/ML ~~LOC~~ SOLN
30.0000 [IU] | Freq: Every day | SUBCUTANEOUS | Status: DC
  Filled 2014-11-21: qty 0.3

## 2014-11-21 MED ORDER — CITALOPRAM HYDROBROMIDE 20 MG PO TABS
20.0000 mg | ORAL_TABLET | Freq: Every day | ORAL | Status: DC
  Administered 2014-11-21: 20 mg via ORAL
  Filled 2014-11-21: qty 1

## 2014-11-21 MED ORDER — GABAPENTIN 600 MG PO TABS
600.0000 mg | ORAL_TABLET | Freq: Three times a day (TID) | ORAL | Status: DC
  Administered 2014-11-21: 600 mg via ORAL

## 2014-11-21 MED ORDER — SIMVASTATIN 40 MG PO TABS: 20.0000 mg | ORAL_TABLET | Freq: Every day | ORAL | Status: DC

## 2014-11-21 MED ORDER — FUROSEMIDE 40 MG PO TABS
40.0000 mg | ORAL_TABLET | Freq: Every day | ORAL | Status: DC
  Filled 2014-11-21: qty 1

## 2014-11-21 NOTE — ED Notes (Signed)
BEHAVIORAL HEALTH ROUNDING Patient sleeping: No. Patient alert and oriented: yes Behavior appropriate: Yes.  ; If no, describe:  Nutrition and fluids offered: yes Toileting and hygiene offered: Yes  Sitter present: q15 minute observations and security camera monitoring Law enforcement present: Yes  ODS  

## 2014-11-21 NOTE — Clinical Social Work Placement (Signed)
   CLINICAL SOCIAL WORK PLACEMENT  NOTE  Date:  11/21/2014  Patient Details  Name: Russell [name] MRN: [id number] Date of Birth: 12-22-1965  Clinical Social Work is seeking post-discharge placement for this patient at the [place] level of care (*CSW will initial, date and re-position this form in  chart as items are completed):  Yes   Patient/family provided with [place] Work Department's list of facilities offering this level of care within the geographic area requested by the patient (or if unable, by the patient's family).  Yes   Patient/family informed of their freedom to choose among providers that offer the needed level of care, that participate in Medicare, Medicaid or managed care program needed by the patient, have an available bed and are willing to accept the patient.      Patient/family informed of [facility]'s ownership interest in [facility] and [facility], as well as of the fact that they are under no obligation to receive care at these facilities.  PASRR submitted to EDS on 11/21/14     PASRR number received on 11/21/14     Existing PASRR number confirmed on       FL2 transmitted to all facilities in geographic area requested by pt/family on 11/21/14     FL2 transmitted to all facilities within larger geographic area on       Patient informed that his/her managed care company has contracts with or will negotiate with certain facilities, including the following:        Yes   Patient/family informed of bed offers received.  Patient chooses bed at  ([facility])     Physician recommends and patient chooses bed at      Patient to be transferred to  ([facility]) on 11/21/14.  Patient to be transferred to facility by  (car)     Patient family notified on 11/21/14 of transfer.  Name of family member notified:  exwife JoParrish     PHYSICIAN       Additional Comment:     _______________________________________________ Alonna Buckler, LCSW 11/21/2014, 4:53 PM

## 2014-11-21 NOTE — ED Notes (Signed)
BEHAVIORAL HEALTH ROUNDING Patient sleeping: Yes.   Patient alert and oriented: yes Behavior appropriate: Yes.  ; If no, describe:  Nutrition and fluids offered: Yes  Toileting and hygiene offered: Yes  Sitter present: no Law enforcement present: Yes  

## 2014-11-21 NOTE — ED Notes (Addendum)
He is sitting in his WC in his room - pt stating  "why in the hell is it taking so long  - I am ready to get placed somewhere - I have never waited here this long.  - I have not gotten any medicine and ya'll suck."  I reassured him and told him about his pending psych evaluation due to his depression  - he reports that he is supposed to go to [facility] and they sent him here for paperwork -  FL2 has supposedly been filled out by his PCP   PT eval ordered   Pt informed that meds are administered as the md orders them while he is here

## 2014-11-21 NOTE — ED Notes (Signed)
PT is in consulting with him at this time

## 2014-11-21 NOTE — ED Provider Notes (Signed)
To this point the patient has not been placed in a facility. He is medically stable. Will be discharged to the [facility].  Earleen Newport, MD 11/21/14 [phone]

## 2014-11-21 NOTE — ED Notes (Signed)

## 2014-11-21 NOTE — ED Notes (Signed)
BEHAVIORAL HEALTH ROUNDING Patient sleeping: Yes.   Patient alert and oriented: yes Behavior appropriate: Yes.  ; If no, describe:  Nutrition and fluids offered: Yes  Toileting and hygiene offered: Yes  Sitter present: yes Law enforcement present: Yes  

## 2014-11-21 NOTE — ED Notes (Signed)
He is to be discharged to transfer to [name] in [place] - he will leave driving his own car

## 2014-11-21 NOTE — ED Notes (Signed)
Am med administered as ordered  Assessment completed

## 2014-11-21 NOTE — Care Management Note (Signed)
Case Management Note  Patient Details  Name: BATU KITTLES MRN: [id number] Date of Birth: 08-May-1965  Subjective/Objective:   CSW Lyn is working on placing pt. At [facility]. I have asked her to e-mail me to let me know where the pt. Ends up as a disposition. Once finalized I will refer pt. To [facility] for followup OP. He may be discharged to the shelter if the efforts fall through. Michael at [facility] has confirmed that a pt. Can use the shelter as a follow up address.                 Action/Plan:   Expected Discharge Date:                  Expected Discharge Plan:     In-House Referral:     Discharge planning Services     Post Acute Care Choice:    Choice offered to:     DME Arranged:    DME Agency:     HH Arranged:    [place] Agency:     Status of Service:     Medicare Important Message Given:    Date Medicare IM Given:    Medicare IM give by:    Date Additional Medicare IM Given:    Additional Medicare Important Message give by:     If discussed at [place] of Stay Meetings, dates discussed:    Additional Comments:  Beau Fanny, RN 11/21/2014, 2:58 PM

## 2014-11-21 NOTE — ED Notes (Addendum)
ED BHU Gantt Is the patient under IVC or is there intent for IVC: no he is voluntary   Is the patient medically cleared: Yes.   Is there vacancy in the ED BHU: Yes.   Is the population mix appropriate for patient: Yes.   Is the patient awaiting placement in inpatient or outpatient setting: No. Has the patient had a psychiatric consult: No. Survey of unit performed for contraband, proper placement and condition of furniture, tampering with fixtures in bathroom, shower, and each patient room: Yes.  ; Findings:  APPEARANCE/BEHAVIOR Calm and cooperative NEURO ASSESSMENT Orientation: oriented x3  Denies pain Hallucinations: No.None noted (Hallucinations) Speech: Normal Gait: normal RESPIRATORY ASSESSMENT even unlabored respirations  CARDIOVASCULAR ASSESSMENT Pulses equal  regular rate  Skin warm and dry GASTROINTESTINAL ASSESSMENT no GI complaint EXTREMITIies   He is a below the knee amputee on his right   - 2 diabetic ulcers noted to his left foot - per pt they do not hinger his ability to tranfser into his WC  PLAN OF CARE Provide calm/safe environment. Vital signs assessed twice daily. ED BHU Assessment once each 12-hour shift. Collaborate with intake RN daily or as condition indicates. Assure the ED provider has rounded once each shift. Provide and encourage hygiene. Provide redirection as needed. Assess for escalating behavior; address immediately and inform ED provider.  Assess family dynamic and appropriateness for visitation as needed: Yes.  ; If necessary, describe findings:  Educate the patient/family about BHU procedures/visitation: Yes.  ; If necessary, describe findings:

## 2014-11-21 NOTE — Progress Notes (Signed)
CSW confirmed SNF bed at [facility] with an LOG while Pt's medicaid is pending. Pt's family is assisting with transfer to SNF. They will need to use Pt's truck due to Pt needing a lift to transport his wheelchair. Pt very pleased with the dc plan. He is hopeful to transition to a family care home or assisted living after SNF.   No further CSW needs at this time.   Toma Copier, [place]

## 2014-11-21 NOTE — Clinical Social Work Note (Signed)
Clinical Social Work Assessment  Patient Details  Name: Russell [name] MRN: [id number] Date of Birth: Oct 14, 1965  Date of referral:  11/20/14               Reason for consult:  Housing Concerns/Homelessness                Permission sought to share information with:  Facility Sport and exercise psychologist, PCP Permission granted to share information::  Yes, Verbal Permission Granted  Name::     Dr. Marcelle Overlie [phone]  Agency::  [facility]  Relationship::     Contact Information:     Housing/Transportation Living arrangements for the past 2 months:  Hotel/Motel Source of Information:  Patient Patient Interpreter Needed:  None Criminal Activity/Legal Involvement Pertinent to Current Situation/Hospitalization:  No - Comment as needed Significant Relationships:  Friend, Spouse Lives with:  Self Do you feel safe going back to the place where you live?    Need for family participation in patient care:  No (Coment)  Care giving concerns:  Pt is homeless and an amputee.  He is having difficulty caring for his medical needs.   Social Worker assessment / plan:  CSW met with pt to address consult for homeless issues.  Pt is a 49 y/o male who has been residing in hotels for approximately 2 weeks.  Pt stated his wife of 27 years left him in April 2016 and he has been having difficulty caring for himself.  He stated he recently lost his mobile home and became homeless.  Pt stated he has friends that drive for him but they are not otherwise supportive.  Pt is interested in going to [facility]) for LTC and stated he was told to come to the ED to get a paper.    CSW spoke to Scientist, forensic at [facility] who explained that pt has [facility] and would need to be assessed by PT to determine if he has skilled needs.  Pt does not have Medicaid and therefore does not have funding for LTC.  Pt reports that he has ulcers on his left foot and is concerned about  losing his foot.  CSW will complete FL2 and initiate a bed search.  Pt will need a LOG if he does not receive authorization from [facility].  Employment status:  Disabled (Comment on whether or not currently receiving Disability) (Pt became disabled in 2005.) Insurance information:  Managed Medicare PT Recommendations:  No Follow Up Information / Referral to community resources:  Shelter, [place]  Patient/Family's Response to care:  Pt was pleasant and appreciative of CSW assistance.  Patient/Family's Understanding of and Emotional Response to Diagnosis, Current Treatment, and Prognosis:  Pt is concerned because he cannot care for his physical needs.  Pt is requesting treatment through a SNF and hopes he will be able to get his own place when he is doing better physically.  Emotional Assessment Appearance:  Appears older than stated age Attitude/Demeanor/Rapport:  Other (Cooperative) Affect (typically observed):  Frustrated, Anxious, Flat Orientation:  Oriented to Self, Oriented to Place, Oriented to  Time, Oriented to Situation Alcohol / Substance use:  Not Applicable Psych involvement (Current and /or in the community):  Yes (Comment) (Psychiatric consult)  Discharge Needs  Concerns to be addressed:  Discharge Planning Concerns, Homelessness, Basic Needs Readmission within the last 30 days:  No Current discharge risk:  Physical Impairment, Homeless, Lack of support system, Lives alone, Dependent with Mobility Barriers  to Discharge:  Homeless with medical needs   Naida Sleight, Annetta North 11/21/2014, 12:09 PM

## 2014-11-21 NOTE — BH Assessment (Addendum)
Assessment Note  Russell [name] is an 49 y.o. male presenting to the ED voluntarily with complaints of depression and out of medications.  Pt reports his wife left him and he has no one to take him.  Pt reports being out of his insulin.  Pt is depressed but denies any SI/HI.  Pt reports having nowhere to live and no support system.  Pt states he is at ED primarily seeking assistance with long term placement.  Pt denies any drug/alcohol use.  He denies auditory/visual hallucinations.  Axis I: Depressive Disorder NOS Axis II: Deferred Axis III:  Past Medical History  Diagnosis Date  . Diabetes mellitus without complication   . Hypertension   . Depression    Axis IV: housing problems, problems with access to health care services and problems with primary support group Axis V: 61-70 mild symptoms  Past Medical History:  Past Medical History  Diagnosis Date  . Diabetes mellitus without complication   . Hypertension   . Depression     Past Surgical History  Procedure Laterality Date  . Below knee leg amputation      Right leg    Family History: No family history on file.  Social History:  reports that he has never smoked. He does not have any smokeless tobacco history on file. He reports that he does not drink alcohol. His drug history is not on file.  Additional Social History:  Alcohol / Drug Use History of alcohol / drug use?: No history of alcohol / drug abuse  CIWA: CIWA-Ar BP: (!) 107/59 mmHg Pulse Rate: 72 COWS:    Allergies:  Allergies  Allergen Reactions  . Shellfish Allergy Hives    Home Medications:  (Not in a hospital admission)  OB/GYN Status:  No LMP for male patient.  General Assessment Data Location of Assessment: [facility] ED TTS Assessment: In system Is this a Tele or Face-to-Face Assessment?: Face-to-Face Is this an Initial Assessment or a Re-assessment for this encounter?: Initial Assessment Marital status: Married [place] name: N/A Is patient  pregnant?: No Pregnancy Status: No Living Arrangements: Other (Comment) (homeless) Can pt return to current living arrangement?: Yes Admission Status: Voluntary Is patient capable of signing voluntary admission?: Yes Referral Source: Self/Family/Friend Insurance type: Medicare  Medical Screening Exam ([place]) Medical Exam completed: Yes  Crisis Care Plan Living Arrangements: Other (Comment) (homeless) Name of Psychiatrist: N/A Name of Therapist: N/A  Education Status Is patient currently in school?: No Current Grade: N/A Highest grade of school patient has completed: N/A Name of school: N/A Contact person: N/A  Risk to self with the past 6 months Suicidal Ideation: No Has patient been a risk to self within the past 6 months prior to admission? : No Suicidal Intent: No Has patient had any suicidal intent within the past 6 months prior to admission? : No Is patient at risk for suicide?: No Suicidal Plan?: No Has patient had any suicidal plan within the past 6 months prior to admission? : No Access to Means: No What has been your use of drugs/alcohol within the last 12 months?: None reported Previous Attempts/Gestures: No How many times?: 0 Other Self Harm Risks: None reported Triggers for Past Attempts: None known Intentional Self Injurious Behavior: None Family Suicide History: No Recent stressful life event(s): Divorce Persecutory voices/beliefs?: No Depression: Yes Depression Symptoms: Tearfulness, Loss of interest in usual pleasures, Feeling worthless/self pity Substance abuse history and/or treatment for substance abuse?: No Suicide prevention information given to non-admitted patients:  Not applicable  Risk to Others within the past 6 months Homicidal Ideation: No Does patient have any lifetime risk of violence toward others beyond the six months prior to admission? : No Thoughts of Harm to Others: No Current Homicidal Intent: No Current Homicidal  Plan: No Access to Homicidal Means: No Identified Victim: None reported History of harm to others?: No Assessment of Violence: None Noted Violent Behavior Description: N/A Does patient have access to weapons?: No Criminal Charges Pending?: No Does patient have a court date: No Is patient on probation?: No  Psychosis Hallucinations: None noted Delusions: None noted  Mental Status Report Appearance/Hygiene: In scrubs Eye Contact: Fair Motor Activity: Other (Comment) (Pt in wheelchair) Speech: Soft, Other (Comment) (crying) Level of Consciousness: Crying Mood: Depressed, Sad, Worthless, low self-esteem Affect: Depressed Anxiety Level: Moderate Thought Processes: Circumstantial Judgement: Partial Orientation: Person, Place, Time, Situation Obsessive Compulsive Thoughts/Behaviors: None  Cognitive Functioning Concentration: Normal Memory: Recent Intact IQ: Average Insight: Fair Impulse Control: Fair Appetite: Fair Weight Loss: 0 Weight Gain: 0 Sleep: No Change Total Hours of Sleep: 6 Vegetative Symptoms: None  ADLScreening [facility] Assessment Services) Patient's cognitive ability adequate to safely complete daily activities?: Yes Patient able to express need for assistance with ADLs?: Yes Independently performs ADLs?: Yes (appropriate for developmental age)  Prior Inpatient Therapy Prior Inpatient Therapy: No Prior Therapy Dates: N/A Prior Therapy Facilty/Provider(s): N/A Reason for Treatment: N/A  Prior Outpatient Therapy Prior Outpatient Therapy: No Prior Therapy Dates: N/A Prior Therapy Facilty/Provider(s): N/A Reason for Treatment: N/A Does patient have an ACCT team?: No Does patient have Intensive In-House Services?  : No Does patient have [facility] services? : No Does patient have P4CC services?: No  ADL Screening (condition at time of admission) Patient's cognitive ability adequate to safely complete daily activities?: Yes Patient able to express need for  assistance with ADLs?: Yes Independently performs ADLs?: Yes (appropriate for developmental age)       Abuse/Neglect Assessment (Assessment to be complete while patient is alone) Physical Abuse: Denies Verbal Abuse: Denies Sexual Abuse: Denies Exploitation of patient/patient's resources: Denies Self-Neglect: Denies Values / Beliefs Cultural Requests During Hospitalization: None Spiritual Requests During Hospitalization: None Consults Spiritual Care Consult Needed: No Social Work Consult Needed: Yes (Comment) (Pt needs assistance with placement.) Advance Directives (For Healthcare) Does patient have an advance directive?: No Would patient like information on creating an advanced directive?: No - patient declined information    Additional Information 1:1 In Past 12 Months?: No CIRT Risk: No Elopement Risk: No     Disposition:  Disposition Initial Assessment Completed for this Encounter: Yes Disposition of Patient: Other dispositions Other disposition(s): Other (Comment) (Psych Md consult) Social Work consult  On Site Evaluation by:   Reviewed with Physician:    Oneita Hurt 11/21/2014 12:08 AM

## 2014-11-21 NOTE — ED Notes (Signed)
Ice water provided to patient

## 2014-11-21 NOTE — ED Notes (Signed)
Discharge instructions reviewed with him and he verbalized agreement and understanding  Directions to [facility] on [address] in [place] provided to him and a packet with his FL2 was given to him as he left and it was marked to be provided to the nurse's at [facility]  1/1 bags of belongings returned to him and he verbalized that he received back all belongings that he came here with   Ronalee Belts is to escort him to his vehicle to assist him with securing his motorized WC on the lift

## 2014-11-21 NOTE — ED Notes (Signed)
Supper provided along with an extra drink  Pt observed with no unusual behavior  Appropriate to stimulation  No verbalized needs or concerns at this time  NAD assessed  Continue to monitor 

## 2014-11-21 NOTE — ED Notes (Signed)
BEHAVIORAL HEALTH ROUNDING Patient sleeping: No. Patient alert and oriented: yes Behavior appropriate: Yes.  ; If no, describe:  Nutrition and fluids offered: yes Toileting and hygiene offered: Yes  Sitter present: q15 minute observations and security camera monitoring Law enforcement present: Yes  ODS   Pt observed with no unusual behavior  Appropriate to stimulation  No verbalized needs or concerns at this time  NAD assessed  Continue to monitor

## 2014-11-21 NOTE — ED Notes (Signed)
Breakfast provided  Appropriate to stimulation  No verbalized needs or concerns at this time  NAD assessed  Continue to monitor

## 2014-11-21 NOTE — Discharge Instructions (Signed)
Hyperglycemia Hyperglycemia occurs when the glucose (sugar) in your blood is too high. Hyperglycemia can happen for many reasons, but it most often happens to people who do not know they have diabetes or are not managing their diabetes properly.  CAUSES  Whether you have diabetes or not, there are other causes of hyperglycemia. Hyperglycemia can occur when you have diabetes, but it can also occur in other situations that you might not be as aware of, such as: Diabetes 1. If you have diabetes and are having problems controlling your blood glucose, hyperglycemia could occur because of some of the following reasons: 1. Not following your meal plan. 2. Not taking your diabetes medications or not taking it properly. 3. Exercising less or doing less activity than you normally do. 4. Being sick. Pre-diabetes  This cannot be ignored. Before people develop Type 2 diabetes, they almost always have "pre-diabetes." This is when your blood glucose levels are higher than normal, but not yet high enough to be diagnosed as diabetes. Research has shown that some long-term damage to the body, especially the heart and circulatory system, may already be occurring during pre-diabetes. If you take action to manage your blood glucose when you have pre-diabetes, you may delay or prevent Type 2 diabetes from developing. Stress  If you have diabetes, you may be "diet" controlled or on oral medications or insulin to control your diabetes. However, you may find that your blood glucose is higher than usual in the hospital whether you have diabetes or not. This is often referred to as "stress hyperglycemia." Stress can elevate your blood glucose. This happens because of hormones put out by the body during times of stress. If stress has been the cause of your high blood glucose, it can be followed regularly by your caregiver. That way he/she can make sure your hyperglycemia does not continue to get worse or progress to  diabetes. Steroids  Steroids are medications that act on the infection fighting system (immune system) to block inflammation or infection. One side effect can be a rise in blood glucose. Most people can produce enough extra insulin to allow for this rise, but for those who cannot, steroids make blood glucose levels go even higher. It is not unusual for steroid treatments to "uncover" diabetes that is developing. It is not always possible to determine if the hyperglycemia will go away after the steroids are stopped. A special blood test called an A1c is sometimes done to determine if your blood glucose was elevated before the steroids were started. SYMPTOMS  Thirsty.  Frequent urination.  Dry mouth.  Blurred vision.  Tired or fatigue.  Weakness.  Sleepy.  Tingling in feet or leg. DIAGNOSIS  Diagnosis is made by monitoring blood glucose in one or all of the following ways:  A1c test. This is a chemical found in your blood.  Fingerstick blood glucose monitoring.  Laboratory results. TREATMENT  First, knowing the cause of the hyperglycemia is important before the hyperglycemia can be treated. Treatment may include, but is not be limited to:  Education.  Change or adjustment in medications.  Change or adjustment in meal plan.  Treatment for an illness, infection, etc.  More frequent blood glucose monitoring.  Change in exercise plan.  Decreasing or stopping steroids.  Lifestyle changes. HOME CARE INSTRUCTIONS   Test your blood glucose as directed.  Exercise regularly. Your caregiver will give you instructions about exercise. Pre-diabetes or diabetes which comes on with stress is helped by exercising.  Eat wholesome,  balanced meals. Eat often and at regular, fixed times. Your caregiver or nutritionist will give you a meal plan to guide your sugar intake.  Being at an ideal weight is important. If needed, losing as little as 10 to 15 pounds may help improve blood  glucose levels. SEEK MEDICAL CARE IF:   You have questions about medicine, activity, or diet.  You continue to have symptoms (problems such as increased thirst, urination, or weight gain). SEEK IMMEDIATE MEDICAL CARE IF:   You are vomiting or have diarrhea.  Your breath smells fruity.  You are breathing faster or slower.  You are very sleepy or incoherent.  You have numbness, tingling, or pain in your feet or hands.  You have chest pain.  Your symptoms get worse even though you have been following your caregiver's orders.  If you have any other questions or concerns. Document Released: 08/11/2000 Document Revised: 05/10/2011 Document Reviewed: 06/14/2011 [facility] Patient Information 2015 [place], [place]. This information is not intended to replace advice given to you by your health care provider. Make sure you discuss any questions you have with your health care provider.  Depression Depression refers to feeling sad, low, down in the dumps, blue, gloomy, or empty. In general, there are two kinds of depression: 2. Normal sadness or normal grief. This kind of depression is one that we all feel from time to time after upsetting life experiences, such as the loss of a job or the ending of a relationship. This kind of depression is considered normal, is short lived, and resolves within a few days to 2 weeks. Depression experienced after the loss of a loved one (bereavement) often lasts longer than 2 weeks but normally gets better with time. 3. Clinical depression. This kind of depression lasts longer than normal sadness or normal grief or interferes with your ability to function at home, at work, and in school. It also interferes with your personal relationships. It affects almost every aspect of your life. Clinical depression is an illness. Symptoms of depression can also be caused by conditions other than those mentioned above, such as:  Physical illness. Some physical illnesses,  including underactive thyroid gland (hypothyroidism), severe anemia, specific types of cancer, diabetes, uncontrolled seizures, heart and lung problems, strokes, and chronic pain are commonly associated with symptoms of depression.  Side effects of some prescription medicine. In some people, certain types of medicine can cause symptoms of depression.  Substance abuse. Abuse of alcohol and illicit drugs can cause symptoms of depression. SYMPTOMS Symptoms of normal sadness and normal grief include the following:  Feeling sad or crying for short periods of time.  Not caring about anything (apathy).  Difficulty sleeping or sleeping too much.  No longer able to enjoy the things you used to enjoy.  Desire to be by oneself all the time (social isolation).  Lack of energy or motivation.  Difficulty concentrating or remembering.  Change in appetite or weight.  Restlessness or agitation. Symptoms of clinical depression include the same symptoms of normal sadness or normal grief and also the following symptoms:  Feeling sad or crying all the time.  Feelings of guilt or worthlessness.  Feelings of hopelessness or helplessness.  Thoughts of suicide or the desire to harm yourself (suicidal ideation).  Loss of touch with reality (psychotic symptoms). Seeing or hearing things that are not real (hallucinations) or having false beliefs about your life or the people around you (delusions and paranoia). DIAGNOSIS  The diagnosis of clinical depression is usually  based on how bad the symptoms are and how long they have lasted. Your health care provider will also ask you questions about your medical history and substance use to find out if physical illness, use of prescription medicine, or substance abuse is causing your depression. Your health care provider may also order blood tests. TREATMENT  Often, normal sadness and normal grief do not require treatment. However, sometimes antidepressant  medicine is given for bereavement to ease the depressive symptoms until they resolve. The treatment for clinical depression depends on how bad the symptoms are but often includes antidepressant medicine, counseling with a mental health professional, or both. Your health care provider will help to determine what treatment is best for you. Depression caused by physical illness usually goes away with appropriate medical treatment of the illness. If prescription medicine is causing depression, talk with your health care provider about stopping the medicine, decreasing the dose, or changing to another medicine. Depression caused by the abuse of alcohol or illicit drugs goes away when you stop using these substances. Some adults need professional help in order to stop drinking or using drugs. SEEK IMMEDIATE MEDICAL CARE IF:  You have thoughts about hurting yourself or others.  You lose touch with reality (have psychotic symptoms).  You are taking medicine for depression and have a serious side effect. FOR MORE INFORMATION  National Alliance on Mental Illness: www.nami.[employer]: https://carter.com/ Document Released: 02/13/2000 Document Revised: 07/02/2013 Document Reviewed: 05/17/2011 [facility] Patient Information 2015 [place], [place]. This information is not intended to replace advice given to you by your health care provider. Make sure you discuss any questions you have with your health care provider.

## 2014-11-21 NOTE — ED Notes (Signed)

## 2014-11-21 NOTE — ED Notes (Signed)
BEHAVIORAL HEALTH ROUNDING Patient sleeping: Yes.   Patient alert and oriented: eyes closed  Appears asleep Behavior appropriate: Yes.  ; If no, describe:  Nutrition and fluids offered: Yes  Toileting and hygiene offered: sleeping Sitter present: q 15 minute observations and security camera monitoring Law enforcement present: yes  ODS 

## 2014-11-21 NOTE — ED Notes (Signed)
Assumed pt care at this time. NAD noted. RR even and nonlabored. Will continue to monitor. 

## 2014-11-21 NOTE — ED Notes (Signed)
Pt observed lying on the stretcher in room 20 with his eyes closed  Even, unlabored respirations observed   NAD pt appears to be sleeping  I will continue to monitor along with every 15 minute visual observations and ongoing security camera monitoring    Motorized WC observed in the room - i inspected it for contraband - none found  Continue to monitor for for safety

## 2014-11-21 NOTE — ED Notes (Signed)
I called CSW  Baxter Flattery and informed her that pt requires a consult

## 2014-11-21 NOTE — Evaluation (Signed)
Physical Therapy Evaluation Patient Details Name: Russell [name] MRN: [id number] DOB: 1966-01-04 Today's Date: 11/21/2014   History of Present Illness  Patient is a 50 y/o male that presents to [facility] with depression and inability to care for himself.   Clinical Impression  Patient is a 49 y/o male that has recently been separated from his wife and has now been living out of a [employer] [address] recently. Patient has been independent with an electric wheelchair for transfers to and from toilet from his chair (he has been in a handicap bathroom). Patient today appears to be at his baseline as he transfers to and from bed to chair with no external assistance required, he denies any falls in the past with transfers. It appears as though his biggest issues now are self-care and hygiene related as he is noted to have ulcers on his LLE and redness/firmness on his LLE in the calf. He states he fatigues throughout the day with the multiple transfers to and from the bathroom as he takes Lasix and is voiding frequently and has a tough time managing his cleanliness with toileting. Patient appears to be at his baseline from a mobility perspective, Education officer, museum consulted for psycho-social and living arrangement needs.     Follow Up Recommendations No PT follow up    Equipment Recommendations       Recommendations for Other Services       Precautions / Restrictions Precautions Precautions: Fall Restrictions Weight Bearing Restrictions: No      Mobility  Bed Mobility                  Transfers Overall transfer level: Needs assistance   Transfers: Stand Pivot Transfers   Stand pivot transfers: Min guard       General transfer comment: Patient is able to transfer to and from his electric wheelchair to the bed and return with cga x 1 for 2 repetitions using his stump as stability and performing a stand pivot with his LLE.   Ambulation/Gait                Stairs            Wheelchair  Mobility    Modified Rankin (Stroke Patients Only)       Balance Overall balance assessment: Modified Independent (No balance deficits noted during transfers x 2. Sitting balance was normal. )                                           Pertinent Vitals/Pain Pain Assessment: No/denies pain    Home Living Family/patient expects to be discharged to::  (Patient has most recently been living in a [employer])                      Prior Function Level of Independence: Independent with assistive device(s)         Comments: Patient has been independent with his electric wheelchair. Denies any falls.      Hand Dominance        Extremity/Trunk Assessment   Upper Extremity Assessment: Overall WFL for tasks assessed           Lower Extremity Assessment: Overall WFL for tasks assessed (RLE BKA, able to use for stability purposes.)         Communication   Communication: No difficulties  Cognition Arousal/Alertness: Awake/alert  Behavior During Therapy:  (Patient is very depressed and tearful throughout this session) Overall Cognitive Status:  (Patient provides insight into his social condition and needs.)                      General Comments General comments (skin integrity, edema, etc.): Patient reports ulcerations on his left foot, hemmorrhoids noted by patient but not observed by this author, and PT notes redness and firmness on his LLE (alerted RN of possible cellulitis/infection).     Exercises        Assessment/Plan    PT Assessment Patent does not need any further PT services  PT Diagnosis     PT Problem List    PT Treatment Interventions     PT Goals (Current goals can be found in the Care Plan section)      Frequency     Barriers to discharge        Co-evaluation               End of Session Equipment Utilized During Treatment: Gait belt Activity Tolerance: Patient limited by fatigue;Patient tolerated  treatment well Patient left: in chair Nurse Communication: Mobility status    Functional Assessment Tool Used: Clinical judgement  Functional Limitation: Mobility: Walking and moving around Mobility: Walking and Moving Around Current Status [id number]): 0 percent impaired, limited or restricted Mobility: Walking and Moving Around Discharge Status [phone]): 0 percent impaired, limited or restricted    Time: [id number] PT Time Calculation (min) (ACUTE ONLY): 20 min   Charges:   PT Evaluation $Initial PT Evaluation Tier I: 1 Procedure     PT G Codes:   PT G-Codes **NOT FOR INPATIENT CLASS** Functional Assessment Tool Used: Clinical judgement  Functional Limitation: Mobility: Walking and moving around Mobility: Walking and Moving Around Current Status [id number]): 0 percent impaired, limited or restricted Mobility: Walking and Moving Around Discharge Status [id number]): 0 percent impaired, limited or restricted    Kerman Passey, PT, DPT    11/21/2014, 11:29 AM

## 2014-11-21 NOTE — Consult Note (Signed)
[facility] Consult   Reason for Consult:  Consult for this 49 year old man with a history of multiple medical problems and poor self-care who comes to the hospital seeking assistance with his living situation Referring Physician:  Jimmye Norman Patient Identification: Russell [name] MRN:  [id number] Principal Diagnosis: Adjustment disorder with depressed mood Diagnosis:   Patient Active Problem List   Diagnosis Date Noted  . Adjustment disorder with depressed mood [F43.21] 11/21/2014  . Other social stressor [Z65.9] 11/21/2014  . 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 Diabetes [E11.9] 11/21/2014    Total Time spent with patient: 1 hour  Subjective:   Russell [name] is a 49 y.o. male patient admitted with "I just need some kind of helped with a place to live".  HPI:  History from the patient and the chart. Patient came to the hospital voluntarily. He states that he feels sad and frustrated especially with regard to his living situation. He's been living in a motel for about a week or so. He lost the ability to stay at the trailer he had been living in previously. Mood feels sad and lonely and down. He feels helpless to take care of himself. Sleep is chronically poor with frequent wakening at night probably from his sleep apnea. Medical care has been spotty. He has trouble keeping up with all of his medicines and keeping everything filled correctly. Multiple medical complaints especially about his chronic pain. Patient denies having any suicidal thought intent or plan. No homicidal ideation. No hallucinations. Currently a be taking medication for depression although he doesn't remember what it would be. He gets follow-up from his primary care doctor. Apparently his wife who had long been his primary support got fed up with him and left him back in April of this year. He carried on taking care of himself for a few months but no longer feels he can go on like that.  Past  psychiatric history: Patient has a history of depression and dependency. He's had psychiatric hospitalizations in the past. He has no history of suicide attempts. No history of substance abuse. No history of violence. No history of psychosis. Antidepressive medicines have been of partial benefit at most.  Medical history: Multiple severe chronic medical problems. Patient is obese and has diabetes which is insulin-dependent. He has chronic pain not only in his back and lower extremities but chronic numbness and pain from diabetic neuropathy in both hands and both feet. High blood pressure.  Social history: Patient does get disability. He recently has been living by himself. He has some family members around him but seems to be estranged from them and doesn't get much help from them or doesn't feel like he can ask for help. Wife has left him and he is convinced she will not be of any assistance anymore.  Family history: Positive for mild depression at most.  Substance abuse history: Patient has no current use of alcohol or drugs no pot past history of alcohol or drug abuse. Doesn't smoke either.  Current medications: He can't list everything but is taking insulin, medicine for blood pressure, gabapentin, some kind of antidepressive. He has had trouble keeping all his medicines filled because of finances and his own lack of resources.   HPI Elements:   Quality:  Poor self-care dependency some depressed mood. Severity:  Moderate. Timing:  Chronic but worse over the last few months and escalating. Duration:  Ongoing. Context:  Lack of social support. Multiple medical problems..  Past Medical  History:  Past Medical History  Diagnosis Date  . Diabetes mellitus without complication   . Hypertension   . Depression     Past Surgical History  Procedure Laterality Date  . Below knee leg amputation      Right leg   Family History: No family history on file. Social History:  History  Alcohol Use  No     History  Drug Use Not on file    Social History   Social History  . Marital Status: Married    Spouse Name: N/A  . Number of Children: N/A  . Years of Education: N/A   Social History Main Topics  . Smoking status: Never Smoker   . Smokeless tobacco: None  . Alcohol Use: No  . Drug Use: None  . Sexual Activity: Not Asked   Other Topics Concern  . None   Social History Narrative  . None   Additional Social History:    History of alcohol / drug use?: No history of alcohol / drug abuse                     Allergies:   Allergies  Allergen Reactions  . Shellfish Allergy Hives    Labs:  Results for orders placed or performed during the hospital encounter of 11/20/14 (from the past 48 hour(s))  Glucose, capillary     Status: Abnormal   Collection Time: 11/20/14  2:28 PM  Result Value Ref Range   Glucose-Capillary 424 (H) 65 - 99 mg/dL  Comprehensive metabolic panel     Status: Abnormal   Collection Time: 11/20/14  2:53 PM  Result Value Ref Range   Sodium 135 135 - 145 mmol/L   Potassium 4.7 3.5 - 5.1 mmol/L   Chloride 96 (L) 101 - 111 mmol/L   CO2 29 22 - 32 mmol/L   Glucose, Bld 386 (H) 65 - 99 mg/dL   BUN 18 6 - 20 mg/dL   Creatinine, Ser 0.98 0.61 - 1.24 mg/dL   Calcium 9.1 8.9 - 10.3 mg/dL   Total Protein 7.7 6.5 - 8.1 g/dL   Albumin 3.6 3.5 - 5.0 g/dL   AST 26 15 - 41 U/L   ALT 21 17 - 63 U/L   Alkaline Phosphatase 101 38 - 126 U/L   Total Bilirubin 0.5 0.3 - 1.2 mg/dL   GFR calc non Af Amer >60 >60 mL/min   GFR calc Af Amer >60 >60 mL/min    Comment: (NOTE) The eGFR has been calculated using the CKD EPI equation. This calculation has not been validated in all clinical situations. eGFR's persistently <60 mL/min signify possible Chronic Kidney Disease.    Anion gap 10 5 - 15  Ethanol     Status: None   Collection Time: 11/20/14  2:53 PM  Result Value Ref Range   Alcohol, Ethyl (B) <5 <5 mg/dL    Comment:        LOWEST DETECTABLE  LIMIT FOR SERUM ALCOHOL IS 5 mg/dL FOR MEDICAL PURPOSES ONLY   CBC with Diff     Status: Abnormal   Collection Time: 11/20/14  2:53 PM  Result Value Ref Range   WBC 7.9 3.8 - 10.6 K/uL   RBC 5.38 4.40 - 5.90 MIL/uL   Hemoglobin 13.7 13.0 - 18.0 g/dL   HCT 41.8 40.0 - 52.0 %   MCV 77.6 (L) 80.0 - 100.0 fL   MCH 25.4 (L) 26.0 - 34.0 pg   MCHC 32.8 32.0 -  36.0 g/dL   RDW 17.2 (H) 11.5 - 14.5 %   Platelets 211 150 - 440 K/uL   Neutrophils Relative % 76 %   Neutro Abs 6.1 1.4 - 6.5 K/uL   Lymphocytes Relative 18 %   Lymphs Abs 1.4 1.0 - 3.6 K/uL   Monocytes Relative 5 %   Monocytes Absolute 0.4 0.2 - 1.0 K/uL   Eosinophils Relative 1 %   Eosinophils Absolute 0.1 0 - 0.7 K/uL   Basophils Relative 0 %   Basophils Absolute 0.0 0 - 0.1 K/uL  Glucose, capillary     Status: Abnormal   Collection Time: 11/20/14  6:37 PM  Result Value Ref Range   Glucose-Capillary 326 (H) 65 - 99 mg/dL   Comment 1 Notify RN   Urine Drug Screen, Qualitative ([facility] only)     Status: None   Collection Time: 11/20/14  7:34 PM  Result Value Ref Range   Tricyclic, Ur Screen NONE DETECTED NONE DETECTED   Amphetamines, Ur Screen NONE DETECTED NONE DETECTED   MDMA (Ecstasy)Ur Screen NONE DETECTED NONE DETECTED   Cocaine Metabolite,Ur [place] NONE DETECTED NONE DETECTED   Opiate, Ur Screen NONE DETECTED NONE DETECTED   Phencyclidine (PCP) Ur S NONE DETECTED NONE DETECTED   Cannabinoid 50 Ng, Ur [place] NONE DETECTED NONE DETECTED   Barbiturates, Ur Screen NONE DETECTED NONE DETECTED   Benzodiazepine, Ur Scrn NONE DETECTED NONE DETECTED   Methadone Scn, Ur NONE DETECTED NONE DETECTED    Comment: (NOTE) 007  Tricyclics, urine               Cutoff 1000 ng/mL 200  Amphetamines, urine             Cutoff 1000 ng/mL 300  MDMA (Ecstasy), urine           Cutoff 500 ng/mL 400  Cocaine Metabolite, urine       Cutoff 300 ng/mL 500  Opiate, urine                   Cutoff 300 ng/mL 600  Phencyclidine (PCP), urine      Cutoff 25  ng/mL 700  Cannabinoid, urine              Cutoff 50 ng/mL 800  Barbiturates, urine             Cutoff 200 ng/mL 900  Benzodiazepine, urine           Cutoff 200 ng/mL 1000 Methadone, urine                Cutoff 300 ng/mL 1100 1200 The urine drug screen provides only a preliminary, unconfirmed 1300 analytical test result and should not be used for non-medical 1400 purposes. Clinical consideration and professional judgment should 1500 be applied to any positive drug screen result due to possible 1600 interfering substances. A more specific alternate chemical method 1700 must be used in order to obtain a confirmed analytical result.  1800 Gas chromato graphy / mass spectrometry (GC/MS) is the preferred 1900 confirmatory method.   Glucose, capillary     Status: Abnormal   Collection Time: 11/20/14  9:15 PM  Result Value Ref Range   Glucose-Capillary 321 (H) 65 - 99 mg/dL  Glucose, capillary     Status: Abnormal   Collection Time: 11/21/14 10:40 AM  Result Value Ref Range   Glucose-Capillary 362 (H) 65 - 99 mg/dL    Vitals: Blood pressure 126/70, pulse 73, temperature 97.8 F (36.6 C), temperature  source Oral, resp. rate 17, height '5\' 11"'  (1.803 m), weight 173.728 kg (383 lb), SpO2 97 %.  Risk to Self: Suicidal Ideation: No Suicidal Intent: No Is patient at risk for suicide?: No Suicidal Plan?: No Access to Means: No What has been your use of drugs/alcohol within the last 12 months?: None reported How many times?: 0 Other Self Harm Risks: None reported Triggers for Past Attempts: None known Intentional Self Injurious Behavior: None Risk to Others: Homicidal Ideation: No Thoughts of Harm to Others: No Current Homicidal Intent: No Current Homicidal Plan: No Access to Homicidal Means: No Identified Victim: None reported History of harm to others?: No Assessment of Violence: None Noted Violent Behavior Description: N/A Does patient have access to weapons?: No Criminal Charges  Pending?: No Does patient have a court date: No Prior Inpatient Therapy: Prior Inpatient Therapy: No Prior Therapy Dates: N/A Prior Therapy Facilty/Provider(s): N/A Reason for Treatment: N/A Prior Outpatient Therapy: Prior Outpatient Therapy: No Prior Therapy Dates: N/A Prior Therapy Facilty/Provider(s): N/A Reason for Treatment: N/A Does patient have an ACCT team?: No Does patient have Intensive In-House Services?  : No Does patient have [facility] services? : No Does patient have P4[facility] services?: No  Current Facility-Administered Medications  Medication Dose Route Frequency Provider Last Rate Last Dose  . gabapentin (NEURONTIN) tablet 600 mg  600 mg Oral TID Carrie Mew, MD   600 mg at 11/21/14 1026   Current Outpatient Prescriptions  Medication Sig Dispense Refill  . aspirin EC 81 MG tablet Take 81 mg by mouth daily.    Marland Kitchen azelastine (OPTIVAR) 0.05 % ophthalmic solution Place 1 drop into both eyes 2 (two) times daily.    . busPIRone (BUSPAR) 7.5 MG tablet Take 7.5 mg by mouth 3 (three) times daily.    . carvedilol (COREG) 3.125 MG tablet Take 3.125 mg by mouth 2 (two) times daily.    . citalopram (CELEXA) 20 MG tablet Take 20 mg by mouth daily.    . furosemide (LASIX) 40 MG tablet Take 40 mg by mouth daily.    Marland Kitchen gabapentin (NEURONTIN) 600 MG tablet Take 600 mg by mouth 3 (three) times daily.    . insulin NPH Human (HUMULIN N,NOVOLIN N) 100 UNIT/ML injection Inject 20-30 Units into the skin 2 (two) times daily. Pt uses 30 units in the morning and 20 units in the evening.    . insulin regular (NOVOLIN R,HUMULIN R) 100 units/mL injection Inject 30 Units into the skin 3 (three) times daily with meals.    . lidocaine (XYLOCAINE) 5 % ointment Apply 1 application topically as needed for mild pain.    Marland Kitchen lisinopril (PRINIVIL,ZESTRIL) 10 MG tablet Take 10 mg by mouth daily.    . magnesium oxide (MAG-OX) 400 MG tablet Take 400 mg by mouth 2 (two) times daily.    . metolazone (ZAROXOLYN) 2.5  MG tablet Take 2.5 mg by mouth daily as needed (prior to Lasix if increased weight).    . nitroGLYCERIN (NITROSTAT) 0.4 MG SL tablet Place 0.4 mg under the tongue every 5 (five) minutes as needed for chest pain.    . potassium chloride SA (K-DUR,KLOR-CON) 20 MEQ tablet Take 20 mEq by mouth 3 (three) times daily.    . simvastatin (ZOCOR) 20 MG tablet Take 20 mg by mouth at bedtime.    . torsemide (DEMADEX) 100 MG tablet Take 100 mg by mouth daily as needed (for edema).     . traZODone (DESYREL) 50 MG tablet Take 25 mg by  mouth at bedtime.    Marland Kitchen venlafaxine XR (EFFEXOR-XR) 150 MG 24 hr capsule Take 150 mg by mouth 2 (two) times daily.      Musculoskeletal: Strength & Muscle Tone: decreased Gait & Station: unable to stand Patient leans: N/A  Psychiatric Specialty Exam: Physical Exam  Nursing note and vitals reviewed. Constitutional: He appears well-developed and well-nourished. He appears distressed.  HENT:  Head: Normocephalic and atraumatic.  Eyes: Conjunctivae are normal. Pupils are equal, round, and reactive to light.  Neck: Normal range of motion.  Cardiovascular: Normal heart sounds.   Respiratory: Effort normal.  GI: Soft.  Musculoskeletal: Normal range of motion.       Arms: Neurological: He is alert.  Patient has tingling and numbness in both hands and feet with decreased use  Skin: Skin is warm and dry.     Psychiatric: Thought content normal. His mood appears anxious. His speech is delayed. He is slowed. Cognition and memory are normal. He expresses inappropriate judgment. He exhibits a depressed mood.    Review of Systems  HENT: Negative.   Eyes: Negative.   Respiratory: Negative.   Cardiovascular: Negative.   Gastrointestinal: Negative.   Musculoskeletal: Positive for back pain.  Skin: Negative.   Neurological: Positive for tingling and weakness.  Psychiatric/Behavioral: Positive for depression. Negative for suicidal ideas, hallucinations and substance abuse. The  patient is nervous/anxious and has insomnia.     Blood pressure 126/70, pulse 73, temperature 97.8 F (36.6 C), temperature source Oral, resp. rate 17, height '5\' 11"'  (1.803 m), weight 173.728 kg (383 lb), SpO2 97 %.Body mass index is 53.44 kg/(m^2).  General Appearance: Disheveled  Eye Contact::  Minimal  Speech:  Slow  Volume:  Decreased  Mood:  Depressed  Affect:  Depressed  Thought Process:  Tangential  Orientation:  Full (Time, Place, and Person)  Thought Content:  Negative  Suicidal Thoughts:  No  Homicidal Thoughts:  No  Memory:  Immediate;   Good Recent;   Fair Remote;   Poor  Judgement:  Impaired  Insight:  Present  Psychomotor Activity:  Decreased  Concentration:  Fair  Recall:  [employer] of Knowledge:Fair  Language: Fair  Akathisia:  No  Handed:  Right  AIMS (if indicated):     Assets:  Communication Skills Desire for Improvement  ADL's:  Impaired  Cognition: WNL  Sleep:      Medical Decision Making: Review of Psycho-Social Stressors (1), Review or order clinical lab tests (1), Established Problem, Worsening (2) and Review of Medication Regimen & Side Effects (2)  Treatment Plan Summary: Plan Patient has symptoms of depression but all of it is in the context of his severe lack of support and multiple real life problems. There is no evidence of psychosis or suicidality. Given his past history is current symptoms are probably pretty much baseline given the situation. Patient does not require psychiatric hospitalization. He primarily is here because of problems with self-care and social needs. Plan to continue current medications as prescribed. Supportive counseling with the patient. No need for commitment. Social work is Artie involved in working with the patient.  Plan:  Patient does not meet criteria for psychiatric inpatient admission. Supportive therapy provided about ongoing stressors. Disposition: Patient does not require psychiatric hospitalization. He will be  seen by social work. Continue current medicine for now.  John Clapacs 11/21/2014 3:04 PM

## 2014-11-21 NOTE — ED Notes (Addendum)
Jeani Hawking CSW came by and consulted with him  [phone]

## 2014-11-21 NOTE — ED Notes (Signed)
Report given to Butch, RN. 

## 2014-11-21 NOTE — ED Notes (Signed)
Lunch provided along with an extra drink  Pt observed with no unusual behavior  Appropriate to stimulation  No verbalized needs or concerns at this time  NAD assessed  Continue to monitor 

## 2014-11-22 LAB — GLUCOSE, CAPILLARY: GLUCOSE-CAPILLARY: 336 mg/dL — AB (ref 65–99)

## 2015-01-21 ENCOUNTER — Ambulatory Visit: Payer: Medicare Other | Admitting: Surgery

## 2015-02-21 ENCOUNTER — Emergency Department
Admission: EM | Admit: 2015-02-21 | Discharge: 2015-02-25 | Disposition: A | Discharge status: Other Acute Inpatient Hospital | Payer: Medicare Other | Attending: Emergency Medicine | Admitting: Emergency Medicine

## 2015-02-21 ENCOUNTER — Encounter: Payer: Self-pay | Admitting: Emergency Medicine

## 2015-02-21 DIAGNOSIS — F329 Major depressive disorder, single episode, unspecified: Secondary | ICD-10-CM | POA: Insufficient documentation

## 2015-02-21 DIAGNOSIS — F4321 Adjustment disorder with depressed mood: Secondary | ICD-10-CM | POA: Diagnosis not present

## 2015-02-21 DIAGNOSIS — Z79899 Other long term (current) drug therapy: Secondary | ICD-10-CM | POA: Insufficient documentation

## 2015-02-21 DIAGNOSIS — Z7982 Long term (current) use of aspirin: Secondary | ICD-10-CM | POA: Diagnosis not present

## 2015-02-21 DIAGNOSIS — I10 Essential (primary) hypertension: Secondary | ICD-10-CM | POA: Insufficient documentation

## 2015-02-21 DIAGNOSIS — Z89511 Acquired absence of right leg below knee: Secondary | ICD-10-CM | POA: Diagnosis not present

## 2015-02-21 DIAGNOSIS — F607 Dependent personality disorder: Secondary | ICD-10-CM | POA: Diagnosis not present

## 2015-02-21 DIAGNOSIS — E119 Type 2 diabetes mellitus without complications: Secondary | ICD-10-CM | POA: Diagnosis not present

## 2015-02-21 DIAGNOSIS — Z794 Long term (current) use of insulin: Secondary | ICD-10-CM | POA: Insufficient documentation

## 2015-02-21 DIAGNOSIS — M79604 Pain in right leg: Secondary | ICD-10-CM

## 2015-02-21 LAB — COMPREHENSIVE METABOLIC PANEL
ALBUMIN: 3.8 g/dL (ref 3.5–5.0)
ALK PHOS: 93 U/L (ref 38–126)
ALT: 18 U/L (ref 17–63)
AST: 19 U/L (ref 15–41)
Anion gap: 11 (ref 5–15)
BILIRUBIN TOTAL: 0.4 mg/dL (ref 0.3–1.2)
BUN: 33 mg/dL — AB (ref 6–20)
CALCIUM: 8.8 mg/dL — AB (ref 8.9–10.3)
CO2: 33 mmol/L — ABNORMAL HIGH (ref 22–32)
CREATININE: 1.03 mg/dL (ref 0.61–1.24)
Chloride: 92 mmol/L — ABNORMAL LOW (ref 101–111)
GFR calc Af Amer: >60 mL/min (ref >60)
GLUCOSE: 221 mg/dL — AB (ref 65–99)
POTASSIUM: 3.2 mmol/L — AB (ref 3.5–5.1)
Sodium: 136 mmol/L (ref 135–145)
TOTAL PROTEIN: 7.6 g/dL (ref 6.5–8.1)

## 2015-02-21 LAB — CBC WITH DIFFERENTIAL/PLATELET
BASOS ABS: 0 10*3/uL (ref 0–0.1)
BASOS PCT: 1 %
Eosinophils Absolute: 0.2 10*3/uL (ref 0–0.7)
Eosinophils Relative: 3 %
HEMATOCRIT: 35.6 % — AB (ref 40.0–52.0)
HEMOGLOBIN: 11.7 g/dL — AB (ref 13.0–18.0)
LYMPHS PCT: 22 %
Lymphs Abs: 1.9 10*3/uL (ref 1.0–3.6)
MCH: 26 pg (ref 26.0–34.0)
MCHC: 32.9 g/dL (ref 32.0–36.0)
MCV: 79.1 fL — AB (ref 80.0–100.0)
MONO ABS: 0.6 10*3/uL (ref 0.2–1.0)
Monocytes Relative: 7 %
NEUTROS ABS: 6 10*3/uL (ref 1.4–6.5)
NEUTROS PCT: 69 %
Platelets: 204 10*3/uL (ref 150–440)
RBC: 4.5 MIL/uL (ref 4.40–5.90)
RDW: 15.3 % — ABNORMAL HIGH (ref 11.5–14.5)
WBC: 8.8 10*3/uL (ref 3.8–10.6)

## 2015-02-21 LAB — BRAIN NATRIURETIC PEPTIDE: B NATRIURETIC PEPTIDE 5: 13 pg/mL (ref 0.0–100.0)

## 2015-02-21 MED ORDER — IBUPROFEN 600 MG PO TABS
600.0000 mg | ORAL_TABLET | Freq: Once | ORAL | Status: AC
  Administered 2015-02-21: 600 mg via ORAL
  Filled 2015-02-21: qty 1

## 2015-02-21 MED ORDER — POTASSIUM CHLORIDE CRYS ER 20 MEQ PO TBCR
20.0000 meq | EXTENDED_RELEASE_TABLET | Freq: Once | ORAL | Status: AC
  Administered 2015-02-21: 20 meq via ORAL
  Filled 2015-02-21: qty 1

## 2015-02-21 MED ORDER — IBUPROFEN 600 MG PO TABS: 600.0000 mg | ORAL_TABLET | Freq: Three times a day (TID) | ORAL | Status: DC | PRN

## 2015-02-21 NOTE — ED Notes (Signed)
Spoke w/ staff at [facility].  Per staff, pt did leave but did not sign AMA papers, so pt will be able to be discharged back to [facility].

## 2015-02-21 NOTE — Discharge Instructions (Signed)
Please seek medical attention for any high fevers, chest pain, shortness of breath, change in behavior, persistent vomiting, bloody stool or any other new or concerning symptoms.   Musculoskeletal Pain Musculoskeletal pain is muscle and boney aches and pains. These pains can occur in any part of the body. Your caregiver may treat you without knowing the cause of the pain. They may treat you if blood or urine tests, X-rays, and other tests were normal.  CAUSES There is often not a definite cause or reason for these pains. These pains may be caused by a type of germ (virus). The discomfort may also come from overuse. Overuse includes working out too hard when your body is not fit. Boney aches also come from weather changes. Bone is sensitive to atmospheric pressure changes. HOME CARE INSTRUCTIONS   Ask when your test results will be ready. Make sure you get your test results.  Only take over-the-counter or prescription medicines for pain, discomfort, or fever as directed by your caregiver. If you were given medications for your condition, do not drive, operate machinery or power tools, or sign legal documents for 24 hours. Do not drink alcohol. Do not take sleeping pills or other medications that may interfere with treatment.  Continue all activities unless the activities cause more pain. When the pain lessens, slowly resume normal activities. Gradually increase the intensity and duration of the activities or exercise.  During periods of severe pain, bed rest may be helpful. Lay or sit in any position that is comfortable.  Putting ice on the injured area.  Put ice in a bag.  Place a towel between your skin and the bag.  Leave the ice on for 15 to 20 minutes, 3 to 4 times a day.  Follow up with your caregiver for continued problems and no reason can be found for the pain. If the pain becomes worse or does not go away, it may be necessary to repeat tests or do additional testing. Your caregiver  may need to look further for a possible cause. SEEK IMMEDIATE MEDICAL CARE IF:  You have pain that is getting worse and is not relieved by medications.  You develop chest pain that is associated with shortness or breath, sweating, feeling sick to your stomach (nauseous), or throw up (vomit).  Your pain becomes localized to the abdomen.  You develop any new symptoms that seem different or that concern you. MAKE SURE YOU:   Understand these instructions.  Will watch your condition.  Will get help right away if you are not doing well or get worse.   This information is not intended to replace advice given to you by your health care provider. Make sure you discuss any questions you have with your health care provider.   Document Released: 02/15/2005 Document Revised: 05/10/2011 Document Reviewed: 10/20/2012 Elsevier Interactive Patient Education [employer].

## 2015-02-21 NOTE — ED Notes (Signed)
Pt and Dr. Archie Balboa made aware of this nurse's conversation with [facility].  Pt verbalized understanding and states he would be willing to go back to the facility

## 2015-02-21 NOTE — ED Notes (Signed)
EMS transport called.

## 2015-02-21 NOTE — ED Provider Notes (Signed)
[facility] Emergency Department Provider Note    ____________________________________________  Time seen: 2315  I have reviewed the triage vital signs and the nursing notes.   HISTORY  Chief Complaint Leg Pain   History limited by: Not Limited   HPI Russell [name] is a 49 y.o. male who presents to the ED today because of concern for right leg pain. He states that the pain has gotten worse today. He states that the pain is where his prosthetic leg sits. He states that it has rubbed on those spots. He has not been able to see his doctor at [facility] that helps manage his prosthetic limb. The patient states that he has not tried any pain medication for the pain. He denies any chest pain, shortness of breath, fever.   Past Medical History  Diagnosis Date  . Diabetes mellitus without complication ([facility])   . Hypertension   . Depression     Patient Active Problem List   Diagnosis Date Noted  . Adjustment disorder with depressed mood 11/21/2014  . Other social stressor 11/21/2014  . 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 Diabetes ([place]) 11/21/2014    Past Surgical History  Procedure Laterality Date  . Below knee leg amputation      Right leg    Current Outpatient Rx  Name  Route  Sig  Dispense  Refill  . aspirin EC 81 MG tablet   Oral   Take 81 mg by mouth daily.         Marland Kitchen atorvastatin (LIPITOR) 10 MG tablet   Oral   Take 10 mg by mouth daily.         . carvedilol (COREG) 3.125 MG tablet   Oral   Take 6.25 mg by mouth 2 (two) times daily.          . furosemide (LASIX) 40 MG tablet   Oral   Take 80 mg by mouth 2 (two) times daily.          Marland Kitchen gabapentin (NEURONTIN) 600 MG tablet   Oral   Take 600 mg by mouth 2 (two) times daily.          . insulin NPH Human (HUMULIN N,NOVOLIN N) 100 UNIT/ML injection   Subcutaneous   Inject 40 Units into the skin 2 (two) times daily. Pt uses 30 units in the morning and 20 units in the  evening.         . insulin regular (NOVOLIN R,HUMULIN R) 100 units/mL injection   Subcutaneous   Inject 20 Units into the skin daily.         Marland Kitchen lisinopril (PRINIVIL,ZESTRIL) 5 MG tablet   Oral   Take 5 mg by mouth daily.         . magnesium oxide (MAG-OX) 400 MG tablet   Oral   Take 400 mg by mouth 2 (two) times daily.         . metolazone (ZAROXOLYN) 2.5 MG tablet   Oral   Take 2.5 mg by mouth daily as needed (prior to Lasix if increased weight).         . potassium chloride SA (K-DUR,KLOR-CON) 20 MEQ tablet   Oral   Take 40 mEq by mouth 2 (two) times daily.          Marland Kitchen azelastine (OPTIVAR) 0.05 % ophthalmic solution   Both Eyes   Place 1 drop into both eyes 2 (two) times daily.         Marland Kitchen  busPIRone (BUSPAR) 7.5 MG tablet   Oral   Take 7.5 mg by mouth 3 (three) times daily.         . citalopram (CELEXA) 20 MG tablet   Oral   Take 20 mg by mouth daily.         . insulin regular (NOVOLIN R,HUMULIN R) 100 units/mL injection   Subcutaneous   Inject 30 Units into the skin 2 (two) times daily before a meal.          . lidocaine (XYLOCAINE) 5 % ointment   Topical   Apply 1 application topically as needed for mild pain.         Marland Kitchen lisinopril (PRINIVIL,ZESTRIL) 10 MG tablet   Oral   Take 10 mg by mouth daily.         . nitroGLYCERIN (NITROSTAT) 0.4 MG SL tablet   Sublingual   Place 0.4 mg under the tongue every 5 (five) minutes as needed for chest pain.         . simvastatin (ZOCOR) 20 MG tablet   Oral   Take 20 mg by mouth at bedtime.         . torsemide (DEMADEX) 100 MG tablet   Oral   Take 100 mg by mouth daily as needed (for edema).          . traZODone (DESYREL) 50 MG tablet   Oral   Take 25 mg by mouth at bedtime.         Marland Kitchen venlafaxine XR (EFFEXOR-XR) 150 MG 24 hr capsule   Oral   Take 150 mg by mouth 2 (two) times daily.           Allergies Shellfish allergy  History reviewed. No pertinent family history.  Social  History Social History  Substance Use Topics  . Smoking status: Never Smoker   . Smokeless tobacco: None  . Alcohol Use: No    Review of Systems  Constitutional: Negative for fever. Cardiovascular: Negative for chest pain. Respiratory: Negative for shortness of breath. Gastrointestinal: Negative for abdominal pain, vomiting and diarrhea. Neurological: Negative for headaches, focal weakness or numbness.  10-point ROS otherwise negative.  ____________________________________________   PHYSICAL EXAM:  VITAL SIGNS: ED Triage Vitals  Enc Vitals Group     BP 02/21/15 2213 139/87 mmHg     Pulse Rate 02/21/15 2213 73     Resp 02/21/15 2213 20     Temp 02/21/15 2213 98.2 F (36.8 C)     Temp Source 02/21/15 2213 Oral     SpO2 02/21/15 2213 99 %     Weight 02/21/15 2213 373 lb (169.192 kg)     Height 02/21/15 2213 5\' 11"  (1.803 m)     Head Cir --      [place]ak Flow --      Pain Score 02/21/15 2213 7   Constitutional: Alert and oriented. Well appearing and in no distress. Eyes: Conjunctivae are normal. PERRL. Normal extraocular movements. ENT   Head: Normocephalic and atraumatic.   Nose: No congestion/rhinnorhea.   Mouth/Throat: Mucous membranes are moist.   Neck: No stridor. Hematological/Lymphatic/Immunilogical: No cervical lymphadenopathy. Cardiovascular: Normal rate, regular rhythm.  No murmurs, rubs, or gallops. Respiratory: Normal respiratory effort without tachypnea nor retractions. Breath sounds are clear and equal bilaterally. No wheezes/rales/rhonchi. Gastrointestinal: Soft and nontender. No distention.  Genitourinary: Deferred Musculoskeletal: s/p right BKA. No erythema, open wounds or deformity to the right leg. Moderate edema to the left leg, non pitting. Neurologic:  Normal speech  and language. No gross focal neurologic deficits are appreciated.  Skin:  Skin is warm, dry and intact. No rash noted. Psychiatric: Mood and affect are normal. Speech and  behavior are normal. Patient exhibits appropriate insight and judgment.  ____________________________________________    LABS (pertinent positives/negatives)  Labs Reviewed  CBC WITH DIFFERENTIAL/PLATELET - Abnormal; Notable for the following:    Hemoglobin 11.7 (*)    HCT 35.6 (*)    MCV 79.1 (*)    RDW 15.3 (*)    All other components within normal limits  COMPREHENSIVE METABOLIC PANEL - Abnormal; Notable for the following:    Potassium 3.2 (*)    Chloride 92 (*)    CO2 33 (*)    Glucose, Bld 221 (*)    BUN 33 (*)    Calcium 8.8 (*)    All other components within normal limits  BRAIN NATRIURETIC PEPTIDE     ____________________________________________   EKG  None  ____________________________________________    RADIOLOGY  None   ____________________________________________   PROCEDURES  Procedure(s) performed: None  Critical Care performed: No  ____________________________________________   INITIAL IMPRESSION / ASSESSMENT AND PLAN / ED COURSE  Pertinent labs & imaging results that were available during my care of the patient were reviewed by me and considered in my medical decision making (see chart for details).  Patient presented to the emergency department today with concerns for right leg pain. Physical exam shows status post right below the knee amputation however no concerning acute findings. No erythema. No open wounds. Blood work shows mild hypokalemia. Will give potassium here. Will discharge with prescription for high-dose ibuprofen.  ____________________________________________   FINAL CLINICAL IMPRESSION(S) / ED DIAGNOSES  Final diagnoses:  Pain of right lower extremity     Nance Pear, MD 02/21/15 2332

## 2015-02-21 NOTE — ED Notes (Signed)
Pt to rm 12 via EMS from [facility].  EMS reports pt wants pain management.  States staff at [facility] was refusing to give pain medication b/c pt was refusing his potassium and potassium is low.  Pt also states he wants fluid drawn off his legs, states increased leg swelling.  Pt mildly agitated upon arrival, respirations equal and unlabored, skin warm and dry.

## 2015-02-22 DIAGNOSIS — M79604 Pain in right leg: Secondary | ICD-10-CM | POA: Diagnosis not present

## 2015-02-22 LAB — GLUCOSE, CAPILLARY
GLUCOSE-CAPILLARY: 180 mg/dL — AB (ref 65–99)
GLUCOSE-CAPILLARY: 214 mg/dL — AB (ref 65–99)
GLUCOSE-CAPILLARY: 213 mg/dL — AB (ref 65–99)
GLUCOSE-CAPILLARY: 179 mg/dL — AB (ref 65–99)
GLUCOSE-CAPILLARY: 234 mg/dL — AB (ref 65–99)
Glucose-Capillary: 178 mg/dL — ABNORMAL HIGH (ref 65–99)

## 2015-02-22 MED ORDER — IBUPROFEN 600 MG PO TABS
600.0000 mg | ORAL_TABLET | Freq: Three times a day (TID) | ORAL | Status: DC | PRN
  Administered 2015-02-22 – 2015-02-24 (×6): 600 mg via ORAL
  Filled 2015-02-22 (×6): qty 1

## 2015-02-22 MED ORDER — INSULIN ASPART 100 UNIT/ML ~~LOC~~ SOLN
20.0000 [IU] | Freq: Three times a day (TID) | SUBCUTANEOUS | Status: DC
  Administered 2015-02-22 – 2015-02-25 (×9): 20 [IU] via SUBCUTANEOUS
  Filled 2015-02-22 (×10): qty 20

## 2015-02-22 MED ORDER — INSULIN ASPART 100 UNIT/ML ~~LOC~~ SOLN
0.0000 [IU] | SUBCUTANEOUS | Status: DC
  Administered 2015-02-22: 8 [IU] via SUBCUTANEOUS
  Administered 2015-02-22: 4 [IU] via SUBCUTANEOUS
  Administered 2015-02-22: 8 [IU] via SUBCUTANEOUS
  Administered 2015-02-22: 4 [IU] via SUBCUTANEOUS
  Administered 2015-02-22: 8 [IU] via SUBCUTANEOUS
  Administered 2015-02-23: 4 [IU] via SUBCUTANEOUS
  Administered 2015-02-23: 16 [IU] via SUBCUTANEOUS
  Administered 2015-02-23: 8 [IU] via SUBCUTANEOUS
  Administered 2015-02-23: 12 [IU] via SUBCUTANEOUS
  Administered 2015-02-23 – 2015-02-24 (×4): 8 [IU] via SUBCUTANEOUS
  Administered 2015-02-24: 4 [IU] via SUBCUTANEOUS
  Administered 2015-02-24: 12 [IU] via SUBCUTANEOUS
  Administered 2015-02-24: 8 [IU] via SUBCUTANEOUS
  Administered 2015-02-24 – 2015-02-25 (×2): 12 [IU] via SUBCUTANEOUS
  Administered 2015-02-25: 8 [IU] via SUBCUTANEOUS
  Administered 2015-02-25: 12 [IU] via SUBCUTANEOUS
  Filled 2015-02-22: qty 4
  Filled 2015-02-22: qty 16
  Filled 2015-02-22 (×3): qty 8
  Filled 2015-02-22 (×3): qty 12
  Filled 2015-02-22: qty 4
  Filled 2015-02-22: qty 8
  Filled 2015-02-22: qty 12
  Filled 2015-02-22: qty 4
  Filled 2015-02-22 (×5): qty 8
  Filled 2015-02-22: qty 12
  Filled 2015-02-22: qty 4
  Filled 2015-02-22: qty 8

## 2015-02-22 MED ORDER — LISINOPRIL 5 MG PO TABS
5.0000 mg | ORAL_TABLET | Freq: Every day | ORAL | Status: DC
  Administered 2015-02-22 – 2015-02-24 (×3): 5 mg via ORAL
  Filled 2015-02-22 (×6): qty 1

## 2015-02-22 MED ORDER — FUROSEMIDE 40 MG PO TABS
80.0000 mg | ORAL_TABLET | Freq: Two times a day (BID) | ORAL | Status: DC
Start: 2015-02-22 — End: 2015-02-25
  Administered 2015-02-22 – 2015-02-25 (×7): 80 mg via ORAL
  Filled 2015-02-22 (×7): qty 2

## 2015-02-22 MED ORDER — CARVEDILOL 6.25 MG PO TABS
6.2500 mg | ORAL_TABLET | Freq: Two times a day (BID) | ORAL | Status: DC
  Administered 2015-02-22 – 2015-02-25 (×7): 6.25 mg via ORAL
  Filled 2015-02-22 (×7): qty 1

## 2015-02-22 MED ORDER — POTASSIUM CHLORIDE CRYS ER 20 MEQ PO TBCR
40.0000 meq | EXTENDED_RELEASE_TABLET | Freq: Every day | ORAL | Status: DC
  Administered 2015-02-22 – 2015-02-25 (×4): 40 meq via ORAL
  Filled 2015-02-22 (×4): qty 2

## 2015-02-22 MED ORDER — GABAPENTIN 600 MG PO TABS
600.0000 mg | ORAL_TABLET | Freq: Two times a day (BID) | ORAL | Status: DC
  Administered 2015-02-22 – 2015-02-25 (×7): 600 mg via ORAL
  Filled 2015-02-22 (×7): qty 1

## 2015-02-22 MED ORDER — ATORVASTATIN CALCIUM 20 MG PO TABS
10.0000 mg | ORAL_TABLET | Freq: Every day | ORAL | Status: DC
  Administered 2015-02-22 – 2015-02-24 (×3): 10 mg via ORAL
  Filled 2015-02-22 (×3): qty 1

## 2015-02-22 MED ORDER — ASPIRIN 81 MG PO CHEW
81.0000 mg | CHEWABLE_TABLET | Freq: Every day | ORAL | Status: DC
  Administered 2015-02-22 – 2015-02-25 (×4): 81 mg via ORAL
  Filled 2015-02-22 (×4): qty 1

## 2015-02-22 NOTE — ED Notes (Signed)
Spoke with Charise from [facility], states pt unable to be discharged to facility.  Charge nurse and MD aware

## 2015-02-22 NOTE — Clinical Social Work Note (Signed)
LCSW completed FL2 and will contact the [facility] as patient is insistant they have made a bed offer. In discussion with patient he reports he has no other options or place to go. He became loud and requested I call the [facility].  Called the [place] and was unable to get through. Will consult EDP and have a pt consult so patient can be placed.  LCSW will put a note out to SNF and ALF  for potential bed offers in the hub.

## 2015-02-22 NOTE — ED Notes (Signed)
CSW arrived to speak with patient regarding placement.

## 2015-02-22 NOTE — ED Notes (Signed)
Called pt's brother at [phone].  No family members able to take pt in temporarily.  Per charge nurse, ED to board pt pending social work consult

## 2015-02-22 NOTE — ED Notes (Signed)
Breakfast tray at bedside 

## 2015-02-22 NOTE — Clinical Social Work Note (Signed)
LCSW Educational psychologist has stated that patient left AMA went to sub station to obtain pain medication and stated he was kicked out to EMS and then EMS came to facility and blasted the nurse off, he then had his ex wife pick him up and pack his personal effects and bring him to [place].  PATIENT IS NOT TO RETURN

## 2015-02-22 NOTE — Clinical Social Work Note (Signed)
ALDF/Clinical Social Work Assessment  Patient Details  Name: Russell [name] MRN: [id number] Date of Birth: 07-Jul-1965  Date of referral:  02/22/15               Reason for consult:  Housing Concerns/Homelessness, [name] Placement                Permission sought to share information with:  [name] Sport and exercise psychologist, Family Supports Permission granted to share information::  Yes, Verbal Permission Granted  Name::     Russell [name]  Agency::     Relationship::  Russell [name] [phone]  Contact Information:     Housing/Transportation Living arrangements for the past 2 months:  [place] of Information:  Patient, Medical Team Patient Interpreter Needed:  None Criminal Activity/Legal Involvement Pertinent to Current Situation/Hospitalization:  No - Comment as needed Significant Relationships:  Siblings Lives with:  [name] Resident Do you feel safe going back to the place where you live?  No Need for family participation in patient care:  Yes (Comment)  Care giving concerns:  Patient is concerned that he was not being treated for pain and when he was denied. He admitted that he refused the potassium one day and then restarted and was med compliant ever since.    Social Worker assessment / plan:  Patient is 49 year old male patient who was placed at [name] and resided there for only 6 weeks (LT Treatment) The administrator set it up to then he was to go to the [name] . Patient reports that he was interviewed and told he was to be placed on Monday the 26th. SW at [name] didn't even know what was going on. LCSW will call Russell [name] and will follow up on this centers plan.  Employment status:  Disabled (Comment on whether or not currently receiving Disability) Insurance information:  Medicare, Medicaid In [place] PT Recommendations:  [place] / Referral to community resources:   Discussed other  options.  Patient/Family's Response to care: Unknown LCSW will contact  Patient/Family's Understanding of and Emotional Response to Diagnosis, Current Treatment, and Prognosis:  TBD- Will contact familyDiscussed  Emotional Assessment Appearance:   Laying down in bed, stated age Attitude/Demeanor/Rapport:  Cooperative,frustrated with the care he received at [name] Affect (typically observed):  Frustrated, Flat Orientation:  Oriented to Self, Oriented to Place, Oriented to  Time Alcohol / Substance use:  Never Used Psych involvement (Current and /or in the community):  No (Comment)  Discharge Needs  Concerns to be addressed:  Compliance Issues Concerns Readmission within the last 30 days:  No Current discharge risk:  Dependent with Mobility issues Barriers to Discharge:  Homeless with medical needs   Russell Reamer, LCSW 02/22/2015, 8:42 AM

## 2015-02-22 NOTE — Clinical Social Work Note (Signed)
LCSW called Thereasa at [facility]. She reported the patient was med seeking and a conflict arose. They have agreed to take him back. LCSW will explain to patient that he is to remain there until his transfer to [facility] will be arranged. LCSW will consult with Senior LCSW to ensure [facility] has met all the requirements for discharge.  In discussion the patient is to return to [facility] and a SW consult initiated.

## 2015-02-22 NOTE — NC FL2 (Signed)
[place] LEVEL OF CARE SCREENING TOOL     IDENTIFICATION  Patient Name: Russell [name] Birthdate: 1966-02-07 Sex: male Admission Date (Current Location): 02/21/2015  [place] and [place] Number:  Russell [name]  [id number] [place] and Address:  [facility], [address] [place], [place] [address]      Provider Number: [id number]  Attending Physician Name and Address:  Nance Pear, MD  Relative Name and Phone Number:  Brother     Current Level of Care: Hospital Recommended Level of Care: [place] Prior Approval Number:    Date Approved/Denied:   PASRR Number:    Discharge Plan: SNF    Current Diagnoses: Patient Active Problem List   Diagnosis Date Noted  . Adjustment disorder with depressed mood 11/21/2014  . Other social stressor 11/21/2014  . 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 Diabetes ([place]) 11/21/2014    Orientation RESPIRATION BLADDER Height & Weight    Self, Time, Situation, Place  Normal Continent 5\' 9"  (175.3 cm) 373 lbs.  BEHAVIORAL SYMPTOMS/MOOD NEUROLOGICAL BOWEL NUTRITION STATUS      Continent Diet (Diabetic)  AMBULATORY STATUS COMMUNICATION OF NEEDS Skin   Extensive Assist Verbally Bruising                       Personal Care Assistance Level of Assistance  Bathing, Feeding, Dressing Bathing Assistance: Maximum assistance Feeding assistance: Limited assistance Dressing Assistance: Maximum assistance     Functional Limitations Info  Sight, Hearing, Speech Sight Info: Adequate (wears glasses) Hearing Info: Adequate Speech Info: Adequate    SPECIAL CARE FACTORS FREQUENCY                       Contractures      Additional Factors Info  Allergies (Shellfish)               Current Medications (02/22/2015):  This is the current hospital active medication list Current Facility-Administered Medications  Medication Dose Route Frequency Provider Last Rate  Last Dose  . aspirin chewable tablet 81 mg  81 mg Oral Daily Nance Pear, MD      . atorvastatin (LIPITOR) tablet 10 mg  10 mg Oral q1800 Nance Pear, MD      . carvedilol (COREG) tablet 6.25 mg  6.25 mg Oral BID WC Nance Pear, MD   6.25 mg at 02/22/15 0802  . furosemide (LASIX) tablet 80 mg  80 mg Oral BID Nance Pear, MD   80 mg at 02/22/15 0802  . gabapentin (NEURONTIN) tablet 600 mg  600 mg Oral BID Nance Pear, MD      . insulin aspart (novoLOG) injection 0-24 Units  0-24 Units Subcutaneous 6 times per day Nance Pear, MD   4 Units at 02/22/15 0802  . lisinopril (PRINIVIL,ZESTRIL) tablet 5 mg  5 mg Oral Daily Nance Pear, MD      . potassium chloride SA (K-DUR,KLOR-CON) CR tablet 40 mEq  40 mEq Oral Daily Nance Pear, MD       Current Outpatient Prescriptions  Medication Sig Dispense Refill  . aspirin EC 81 MG tablet Take 81 mg by mouth daily.    Marland Kitchen atorvastatin (LIPITOR) 10 MG tablet Take 10 mg by mouth daily.    . carvedilol (COREG) 3.125 MG tablet Take 6.25 mg by mouth 2 (two) times daily.     . furosemide (LASIX) 40 MG tablet Take 80 mg by mouth 2 (two) times daily.     Marland Kitchen  gabapentin (NEURONTIN) 600 MG tablet Take 600 mg by mouth 2 (two) times daily.     . insulin NPH Human (HUMULIN N,NOVOLIN N) 100 UNIT/ML injection Inject 40 Units into the skin 2 (two) times daily. Pt uses 30 units in the morning and 20 units in the evening.    . insulin regular (NOVOLIN R,HUMULIN R) 100 units/mL injection Inject 20 Units into the skin daily.    Marland Kitchen lisinopril (PRINIVIL,ZESTRIL) 5 MG tablet Take 5 mg by mouth daily.    . magnesium oxide (MAG-OX) 400 MG tablet Take 400 mg by mouth 2 (two) times daily.    . metolazone (ZAROXOLYN) 2.5 MG tablet Take 2.5 mg by mouth daily as needed (prior to Lasix if increased weight).    . potassium chloride SA (K-DUR,KLOR-CON) 20 MEQ tablet Take 40 mEq by mouth 2 (two) times daily.     Marland Kitchen azelastine (OPTIVAR) 0.05 % ophthalmic solution  Place 1 drop into both eyes 2 (two) times daily.    . busPIRone (BUSPAR) 7.5 MG tablet Take 7.5 mg by mouth 3 (three) times daily.    . citalopram (CELEXA) 20 MG tablet Take 20 mg by mouth daily.    Marland Kitchen ibuprofen (ADVIL,MOTRIN) 600 MG tablet Take 1 tablet (600 mg total) by mouth every 8 (eight) hours as needed. 20 tablet 0  . insulin regular (NOVOLIN R,HUMULIN R) 100 units/mL injection Inject 30 Units into the skin 2 (two) times daily before a meal.     . lidocaine (XYLOCAINE) 5 % ointment Apply 1 application topically as needed for mild pain.    Marland Kitchen lisinopril (PRINIVIL,ZESTRIL) 10 MG tablet Take 10 mg by mouth daily.    . nitroGLYCERIN (NITROSTAT) 0.4 MG SL tablet Place 0.4 mg under the tongue every 5 (five) minutes as needed for chest pain.    . simvastatin (ZOCOR) 20 MG tablet Take 20 mg by mouth at bedtime.    . torsemide (DEMADEX) 100 MG tablet Take 100 mg by mouth daily as needed (for edema).     . traZODone (DESYREL) 50 MG tablet Take 25 mg by mouth at bedtime.    Marland Kitchen venlafaxine XR (EFFEXOR-XR) 150 MG 24 hr capsule Take 150 mg by mouth 2 (two) times daily.       Discharge Medications: Please see discharge summary for a list of discharge medications.  Relevant Imaging Results:  Relevant Lab Results:   Additional Information Patient  has below knee amputation (right leg) and has an Social research officer, government # [phone]  Joana Reamer, LCSW

## 2015-02-22 NOTE — ED Notes (Signed)
Med rec completed using papers sent by [facility].  MD informed.

## 2015-02-22 NOTE — Clinical Social Work Note (Signed)
In review no facility has answered our requests for a bed. LCSW will inform EDP and its unlikely pt will be placed today.   [employer] LCSW [phone]

## 2015-02-23 DIAGNOSIS — M79604 Pain in right leg: Secondary | ICD-10-CM | POA: Diagnosis not present

## 2015-02-23 LAB — GLUCOSE, CAPILLARY
GLUCOSE-CAPILLARY: 184 mg/dL — AB (ref 65–99)
GLUCOSE-CAPILLARY: 286 mg/dL — AB (ref 65–99)
Glucose-Capillary: 205 mg/dL — ABNORMAL HIGH (ref 65–99)
Glucose-Capillary: 221 mg/dL — ABNORMAL HIGH (ref 65–99)
Glucose-Capillary: 234 mg/dL — ABNORMAL HIGH (ref 65–99)
Glucose-Capillary: 322 mg/dL — ABNORMAL HIGH (ref 65–99)

## 2015-02-23 MED ORDER — ACETAMINOPHEN 500 MG PO TABS
1000.0000 mg | ORAL_TABLET | Freq: Once | ORAL | Status: AC
  Administered 2015-02-23: 1000 mg via ORAL
  Filled 2015-02-23: qty 2

## 2015-02-23 MED ORDER — IBUPROFEN 600 MG PO TABS
600.0000 mg | ORAL_TABLET | Freq: Once | ORAL | Status: AC
  Administered 2015-02-23: 600 mg via ORAL
  Filled 2015-02-23: qty 1

## 2015-02-23 MED ORDER — OXYCODONE-ACETAMINOPHEN 5-325 MG PO TABS
1.0000 | ORAL_TABLET | Freq: Once | ORAL | Status: AC
  Administered 2015-02-23: 1 via ORAL
  Filled 2015-02-23: qty 1

## 2015-02-23 NOTE — ED Notes (Signed)
Pt resting in bed in no acute distress 

## 2015-02-23 NOTE — ED Notes (Signed)
Pt sleeping. 

## 2015-02-23 NOTE — ED Notes (Signed)
Pt sitting up in chair, continues to state improved pain after percocet.

## 2015-02-23 NOTE — ED Notes (Signed)
Lunch provided. Pt states does not wish to eat at this time. Insulin aspart held until pt eats lunch.

## 2015-02-23 NOTE — ED Notes (Signed)
Report to janie, rn.  

## 2015-02-23 NOTE — ED Notes (Signed)
Pt complains of 10/10 leg pain. Pt states is unable to get comfortable in bed due to leg pain. Dr. Beather Arbour notified, pt requesting pain medication. Order for percocet received.

## 2015-02-23 NOTE — ED Provider Notes (Signed)
-----------------------------------------   6:50 AM on 02/23/2015 -----------------------------------------  No events overnight. Percocet given for patient's limb pain. Awaiting social work disposition.  Paulette Blanch, MD 02/23/15 [phone]

## 2015-02-23 NOTE — ED Notes (Signed)
Pt sleeping, periodically with pox dropping to 70s. RT to come and fit pt with venti mask to improve oxygenation. Pt sitting up while sleeping. Per pt history of sleep apnea.

## 2015-02-24 DIAGNOSIS — M79604 Pain in right leg: Secondary | ICD-10-CM | POA: Diagnosis not present

## 2015-02-24 LAB — GLUCOSE, CAPILLARY
GLUCOSE-CAPILLARY: 187 mg/dL — AB (ref 65–99)
GLUCOSE-CAPILLARY: 233 mg/dL — AB (ref 65–99)
GLUCOSE-CAPILLARY: 271 mg/dL — AB (ref 65–99)
Glucose-Capillary: 223 mg/dL — ABNORMAL HIGH (ref 65–99)
Glucose-Capillary: 230 mg/dL — ABNORMAL HIGH (ref 65–99)
Glucose-Capillary: 290 mg/dL — ABNORMAL HIGH (ref 65–99)

## 2015-02-24 MED ORDER — OXYCODONE-ACETAMINOPHEN 5-325 MG PO TABS
1.0000 | ORAL_TABLET | Freq: Once | ORAL | Status: AC
  Administered 2015-02-24: 1 via ORAL
  Filled 2015-02-24: qty 1

## 2015-02-24 MED ORDER — ACETAMINOPHEN 500 MG PO TABS
1000.0000 mg | ORAL_TABLET | Freq: Once | ORAL | Status: DC
Start: 2015-02-24 — End: 2015-02-24

## 2015-02-24 NOTE — ED Notes (Addendum)
Pt wheeled himself to the restroom with his motor chair without difficulty.

## 2015-02-24 NOTE — Clinical Social Work Note (Signed)
LCSW called and left message for Admission and resident care director at the [facility] awaiting a call back. LCSW will attempt to call other facilities today and consult with team lead.

## 2015-02-24 NOTE — ED Provider Notes (Addendum)
No Events overnight. Patient continues to await placement per social work  Lavonia Drafts, MD 02/24/15 [id number]  Lavonia Drafts, MD 02/24/15 [phone]

## 2015-02-24 NOTE — ED Notes (Addendum)
Pt requests food tray, pt informed no food and snakes after 2130. He states he did not know because he was offered food yesterday at this time. Pt made aware that we'll try to accommodate at this time if we could find something in fridge but the facility does not have 24 hours food service. Pt verbalize understanding.

## 2015-02-24 NOTE — Clinical Social Work Note (Signed)
Spoke to [facility] Due to admission criteria patient cant be placed at SNF at this time. LCSW will continue to search.  Suanne Minahan LCSW Clinical Social work [phone]

## 2015-02-24 NOTE — Clinical Social Work Note (Signed)
LCSW consulted with team mate Mel Almond she recommended I call Jor[facility] of [facility]. Called and she called [facility] , she is agreeing to take him. LCSW will fax over all documentation and Fl2  And information to her [phone]

## 2015-02-24 NOTE — Clinical Social Work Note (Signed)
LCSW and RN met with patient he did disclose he has 176.00 at this time. LCSW explained about being unable to find him housing at ALF or SNF as he is medically and mentally cleared. He became somewhat combative and demanded he stay in the hospital to have his leg looked at and his hemroids. Nurse will explain needs to doctor on pts behalf. We discussed other alternative housing options patient was not impressed with group home alternatives. LCSW agreed to provide patient with list of rooming houses or hotels. Patient has a laptop and his own phone set up in the room and is capable of finding alternative housing options. LCSW will provide him with alternative numbers.

## 2015-02-24 NOTE — NC FL2 (Cosign Needed)
[place] LEVEL OF CARE SCREENING TOOL     IDENTIFICATION  Patient Name: Russell [name] Birthdate: 07-Mar-1965 Sex: male Admission Date (Current Location): 02/21/2015  [place] and [place] Number:  Engineering geologist and Address:  [facility], [address], [place], [place] [address]      Provider Number: [id number]  Attending Physician Name and Address:  Provider Default, MD  Relative Name and Phone Number:  Brother     Current Level of Care: Hospital Recommended Level of Care: [name] Prior Approval Number:    Date Approved/Denied:   PASRR Number:    Discharge Plan: SNF    Current Diagnoses: Patient Active Problem List   Diagnosis Date Noted  . Adjustment disorder with depressed mood 11/21/2014  . Other social stressor 11/21/2014  . 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 Diabetes ([place]) 11/21/2014    Orientation RESPIRATION BLADDER Height & Weight    Self, Time, Situation, Place  Normal Continent 5\' 9"  (175.3 cm) 373 lbs.  BEHAVIORAL SYMPTOMS/MOOD NEUROLOGICAL BOWEL NUTRITION STATUS      Continent Diet (Diabetic)  AMBULATORY STATUS COMMUNICATION OF NEEDS Skin   Extensive Assist Verbally Bruising                       Personal Care Assistance Level of Assistance  Bathing, Feeding, Dressing Bathing Assistance: Maximum assistance Feeding assistance: Limited assistance Dressing Assistance: Maximum assistance     Functional Limitations Info  Sight, Hearing, Speech Sight Info: Adequate (wears glasses) Hearing Info: Adequate Speech Info: Adequate    SPECIAL CARE FACTORS FREQUENCY                       Contractures      Additional Factors Info  Allergies ([place])               Current Medications (02/24/2015):  This is the current hospital active medication list Current Facility-Administered Medications  Medication Dose Route Frequency Provider Last Rate Last Dose   . aspirin chewable tablet 81 mg  81 mg Oral Daily Nance Pear, MD   81 mg at 02/24/15 0907  . atorvastatin (LIPITOR) tablet 10 mg  10 mg Oral q1800 Nance Pear, MD   10 mg at 02/23/15 1703  . carvedilol (COREG) tablet 6.25 mg  6.25 mg Oral BID WC Nance Pear, MD   6.25 mg at 02/24/15 0744  . furosemide (LASIX) tablet 80 mg  80 mg Oral BID Nance Pear, MD   80 mg at 02/24/15 0743  . gabapentin (NEURONTIN) tablet 600 mg  600 mg Oral BID Nance Pear, MD   600 mg at 02/24/15 0906  . ibuprofen (ADVIL,MOTRIN) tablet 600 mg  600 mg Oral Q8H PRN Lisa Roca, MD   600 mg at 02/24/15 0011  . insulin aspart (novoLOG) injection 0-24 Units  0-24 Units Subcutaneous 6 times per day Nance Pear, MD   8 Units at 02/24/15 0745  . insulin aspart (novoLOG) injection 20 Units  20 Units Subcutaneous TID [facility] Lisa Roca, MD   20 Units at 02/24/15 0745  . lisinopril (PRINIVIL,ZESTRIL) tablet 5 mg  5 mg Oral Daily Nance Pear, MD   5 mg at 02/23/15 1138  . potassium chloride SA (K-DUR,KLOR-CON) CR tablet 40 mEq  40 mEq Oral Daily Nance Pear, MD   40 mEq at 02/24/15 [id number]   Current Outpatient Prescriptions  Medication Sig Dispense Refill  . aspirin EC 81 MG tablet  Take 81 mg by mouth daily.    Marland Kitchen atorvastatin (LIPITOR) 10 MG tablet Take 10 mg by mouth daily.    . carvedilol (COREG) 3.125 MG tablet Take 6.25 mg by mouth 2 (two) times daily.     . furosemide (LASIX) 40 MG tablet Take 80 mg by mouth 2 (two) times daily.     Marland Kitchen gabapentin (NEURONTIN) 600 MG tablet Take 600 mg by mouth 2 (two) times daily.     . insulin NPH Human (HUMULIN N,NOVOLIN N) 100 UNIT/ML injection Inject 40 Units into the skin 2 (two) times daily. Pt uses 30 units in the morning and 20 units in the evening.    . insulin regular (NOVOLIN R,HUMULIN R) 100 units/mL injection Inject 20 Units into the skin daily.    Marland Kitchen lisinopril (PRINIVIL,ZESTRIL) 5 MG tablet Take 5 mg by mouth daily.    . magnesium oxide (MAG-OX) 400 MG  tablet Take 400 mg by mouth 2 (two) times daily.    . metolazone (ZAROXOLYN) 2.5 MG tablet Take 2.5 mg by mouth daily as needed (prior to Lasix if increased weight).    . potassium chloride SA (K-DUR,KLOR-CON) 20 MEQ tablet Take 40 mEq by mouth 2 (two) times daily.     Marland Kitchen azelastine (OPTIVAR) 0.05 % ophthalmic solution Place 1 drop into both eyes 2 (two) times daily.    . busPIRone (BUSPAR) 7.5 MG tablet Take 7.5 mg by mouth 3 (three) times daily.    . citalopram (CELEXA) 20 MG tablet Take 20 mg by mouth daily.    Marland Kitchen ibuprofen (ADVIL,MOTRIN) 600 MG tablet Take 1 tablet (600 mg total) by mouth every 8 (eight) hours as needed. 20 tablet 0  . insulin regular (NOVOLIN R,HUMULIN R) 100 units/mL injection Inject 30 Units into the skin 2 (two) times daily before a meal.     . lidocaine (XYLOCAINE) 5 % ointment Apply 1 application topically as needed for mild pain.    Marland Kitchen lisinopril (PRINIVIL,ZESTRIL) 10 MG tablet Take 10 mg by mouth daily.    . nitroGLYCERIN (NITROSTAT) 0.4 MG SL tablet Place 0.4 mg under the tongue every 5 (five) minutes as needed for chest pain.    . simvastatin (ZOCOR) 20 MG tablet Take 20 mg by mouth at bedtime.    . torsemide (DEMADEX) 100 MG tablet Take 100 mg by mouth daily as needed (for edema).     . traZODone (DESYREL) 50 MG tablet Take 25 mg by mouth at bedtime.    Marland Kitchen venlafaxine XR (EFFEXOR-XR) 150 MG 24 hr capsule Take 150 mg by mouth 2 (two) times daily.       Discharge Medications: Please see discharge summary for a list of discharge medications.  Relevant Imaging Results:  Relevant Lab Results:   Additional Information Patient  has below knee amputation (right leg) and has an Conservator, museum/gallery  Lincon Sahlin M, LCSW

## 2015-02-24 NOTE — Clinical Social Work Note (Signed)
LCSW met with patient and provided patient with list of SNF and ALF list. In review of all placement options via the [facility]- Patient has not been accepted at this time LCSW will resend.

## 2015-02-24 NOTE — Clinical Social Work Note (Signed)
LCSW met with patient and Stanton Kidney from [facility] agreed to come to meet patient for a follow up and to assess pts needs for housing. She apparently has 2 options at this time, patient is agreeable to supported in a care home and does not smoke. He is agreeable to be placed in a good home at this time. Patient was provided several apartment rental listings, [place] listings and boarding houses he is to follow up himself as he has a home office set up in our ED room ( phone laptop)   [place] LCSW [phone]

## 2015-02-25 DIAGNOSIS — M79604 Pain in right leg: Secondary | ICD-10-CM | POA: Diagnosis not present

## 2015-02-25 LAB — GLUCOSE, CAPILLARY
Glucose-Capillary: 240 mg/dL — ABNORMAL HIGH (ref 65–99)
Glucose-Capillary: 266 mg/dL — ABNORMAL HIGH (ref 65–99)
Glucose-Capillary: 288 mg/dL — ABNORMAL HIGH (ref 65–99)

## 2015-02-25 MED ORDER — OXYCODONE-ACETAMINOPHEN 5-325 MG PO TABS
1.0000 | ORAL_TABLET | Freq: Once | ORAL | Status: DC
  Filled 2015-02-25: qty 1

## 2015-02-25 NOTE — ED Notes (Addendum)
BEHAVIORAL HEALTH ROUNDING Patient sleeping: Yes.   Patient alert and oriented: eyes closed  Appears asleep Behavior appropriate: Yes.  ; If no, describe:  Nutrition and fluids offered: Yes  Toileting and hygiene offered: sleeping Sitter present: q 15 minute observations and security monitoring Law enforcement present: yes  ODS 

## 2015-02-25 NOTE — ED Provider Notes (Signed)
She has been accepted to a group home in [facility] Currently medical stable for discharge  Earleen Newport, MD 02/25/15 1233

## 2015-02-25 NOTE — ED Provider Notes (Addendum)
-----------------------------------------   8:24 AM on 02/25/2015 -----------------------------------------   Blood pressure 108/58, pulse 65, temperature 98 F (36.7 C), temperature source Oral, resp. rate 18, height 5\' 11"  (1.803 m), weight 373 lb (169.192 kg), SpO2 95 %.  The patient had no acute events since last update.  Calm and cooperative at this time.  Patient awaiting social work placement  The patient received some percocet overnight for leg pain.   Loney Hering, MD 02/25/15 0825  Loney Hering, MD 02/25/15 [phone]

## 2015-02-25 NOTE — ED Notes (Signed)
[facility] transport has arrived to take him to a group home in [facility]   Discharge instructions reviewed with him and he verbalizes agreement and understanding

## 2015-02-25 NOTE — ED Notes (Signed)
Patient observed lying in bed with eyes closed  Even, unlabored respirations observed   NAD pt appears to be sleeping  I will continue to monitor along with every 15 minute visual observations and ongoing security camera monitoring    

## 2015-02-25 NOTE — ED Notes (Signed)
Lunch provided along with an extra drink  Assessment completed  Pt reports 3/10 pain at this time  "I don't hurt I just have some tingling in my hands maam - that's all  - I don't want any pain medicine."  meds administered as ordered  Continue to monitor

## 2015-02-25 NOTE — ED Notes (Signed)
Breakfast provided   Patient observed lying in bed with eyes closed  Even, unlabored respirations observed   NAD pt appears to be sleeping  I will continue to monitor along with every 15 minute visual observations and ongoing security camera monitoring

## 2015-02-25 NOTE — ED Notes (Addendum)
Pt refused pain meds, states he does not want to take narcotics because they make him itch. Pt states he would rather sleep it off right now. Pt made aware that he can request this percocet if feels he needs it. Pt verbalize understanding.

## 2015-02-25 NOTE — ED Notes (Addendum)

## 2015-02-25 NOTE — ED Notes (Signed)
ED BHU [place] Is the patient under IVC or is there intent for IVC:  no   Is the patient medically cleared: Yes.   Is there vacancy in the ED BHU: Yes.   Is the population mix appropriate for patient: Yes.   Is the patient awaiting placement in inpatient or outpatient setting: awaiting placement   Has the patient had a psychiatric consult: completed   Survey of unit performed for contraband, proper placement and condition of furniture, tampering with fixtures in bathroom, shower, and each patient room: Yes.  ; Findings:  APPEARANCE/BEHAVIOR Calm and cooperative NEURO ASSESSMENT Orientation: oriented x3  Denies pain Hallucinations: No.None noted (Hallucinations) Speech: Normal Gait: Right BKA  RESPIRATORY ASSESSMENT Even  Unlabored respirations  CARDIOVASCULAR ASSESSMENT Pulses equal   regular rate  Skin warm and dry   GASTROINTESTINAL ASSESSMENT no GI complaint EXTREMITIES Right BKA  PLAN OF CARE Provide calm/safe environment. Vital signs assessed twice daily. ED BHU Assessment once each 12-hour shift. Collaborate with intake RN daily or as condition indicates. Assure the ED provider has rounded once each shift. Provide and encourage hygiene. Provide redirection as needed. Assess for escalating behavior; address immediately and inform ED provider.  Assess family dynamic and appropriateness for visitation as needed: Yes.  ; If necessary, describe findings:  Educate the patient/family about BHU procedures/visitation: Yes.  ; If necessary, describe findings:

## 2015-02-25 NOTE — ED Notes (Addendum)
BEHAVIORAL HEALTH ROUNDING Patient sleeping: No. Patient alert and oriented: yes Behavior appropriate: Yes.  ; If no, describe:  Nutrition and fluids offered: yes Toileting and hygiene offered: Yes  Sitter present: q15 minute observations and security  monitoring Law enforcement present: Yes  ODS  

## 2015-02-25 NOTE — Clinical Social Work Note (Signed)
[facility] [place] has been approved to transport pt and wheelchair by [employer] and will arrange for  1-2 pm pick up. LCSW will speak to EDP and med Network engineer.

## 2015-02-25 NOTE — Progress Notes (Signed)
LCSW met with Russell [name] from [facility]- She met with patient and reviewed pts medication list and requirements for SNF. She is agreeable to take him. She will contact [place] herself to have patient transported to [facility]. EDP was informed of patients discharge and will document as required. LCSW consulted [facility] who assisted with phone contacts. LCSW called and left a message with Vance Gather to see if patient is covered for medicaid transport.

## 2015-07-23 DIAGNOSIS — E1165 Type 2 diabetes mellitus with hyperglycemia: Secondary | ICD-10-CM | POA: Diagnosis present

## 2015-07-23 DIAGNOSIS — IMO0002 Reserved for concepts with insufficient information to code with codable children: Secondary | ICD-10-CM | POA: Diagnosis present

## 2015-09-12 ENCOUNTER — Emergency Department: Payer: Medicare Other

## 2015-09-12 ENCOUNTER — Encounter: Payer: Self-pay | Admitting: Emergency Medicine

## 2015-09-12 ENCOUNTER — Emergency Department
Admission: EM | Admit: 2015-09-12 | Discharge: 2015-09-12 | Disposition: A | Discharge status: Home / Self Care | Payer: Medicare Other | Attending: Emergency Medicine | Admitting: Emergency Medicine

## 2015-09-12 DIAGNOSIS — F329 Major depressive disorder, single episode, unspecified: Secondary | ICD-10-CM | POA: Insufficient documentation

## 2015-09-12 DIAGNOSIS — E119 Type 2 diabetes mellitus without complications: Secondary | ICD-10-CM | POA: Insufficient documentation

## 2015-09-12 DIAGNOSIS — I10 Essential (primary) hypertension: Secondary | ICD-10-CM | POA: Diagnosis not present

## 2015-09-12 DIAGNOSIS — R059 Cough, unspecified: Secondary | ICD-10-CM

## 2015-09-12 DIAGNOSIS — Z91013 Allergy to seafood: Secondary | ICD-10-CM | POA: Insufficient documentation

## 2015-09-12 DIAGNOSIS — Z7982 Long term (current) use of aspirin: Secondary | ICD-10-CM | POA: Diagnosis not present

## 2015-09-12 DIAGNOSIS — Z794 Long term (current) use of insulin: Secondary | ICD-10-CM | POA: Insufficient documentation

## 2015-09-12 DIAGNOSIS — T814XXA Infection following a procedure, initial encounter: Secondary | ICD-10-CM | POA: Insufficient documentation

## 2015-09-12 DIAGNOSIS — R05 Cough: Secondary | ICD-10-CM

## 2015-09-12 DIAGNOSIS — Y69 Unspecified misadventure during surgical and medical care: Secondary | ICD-10-CM | POA: Diagnosis not present

## 2015-09-12 DIAGNOSIS — T148XXA Other injury of unspecified body region, initial encounter: Secondary | ICD-10-CM

## 2015-09-12 LAB — CBC WITH DIFFERENTIAL/PLATELET
BASOS ABS: 0.1 10*3/uL (ref 0–0.1)
Basophils Relative: 1 %
EOS PCT: 2 %
Eosinophils Absolute: 0.2 10*3/uL (ref 0–0.7)
HCT: 41.1 % (ref 40.0–52.0)
Hemoglobin: 14 g/dL (ref 13.0–18.0)
LYMPHS PCT: 17 %
Lymphs Abs: 1.6 10*3/uL (ref 1.0–3.6)
MCH: 26.6 pg (ref 26.0–34.0)
MCHC: 34.1 g/dL (ref 32.0–36.0)
MCV: 78 fL — AB (ref 80.0–100.0)
MONO ABS: 0.5 10*3/uL (ref 0.2–1.0)
MONOS PCT: 6 %
Neutro Abs: 6.8 10*3/uL — ABNORMAL HIGH (ref 1.4–6.5)
Neutrophils Relative %: 74 %
PLATELETS: 219 10*3/uL (ref 150–440)
RBC: 5.28 MIL/uL (ref 4.40–5.90)
RDW: 14.2 % (ref 11.5–14.5)
WBC: 9.1 10*3/uL (ref 3.8–10.6)

## 2015-09-12 LAB — COMPREHENSIVE METABOLIC PANEL
ALT: 15 U/L — ABNORMAL LOW (ref 17–63)
ANION GAP: 6 (ref 5–15)
AST: 19 U/L (ref 15–41)
Albumin: 3.7 g/dL (ref 3.5–5.0)
Alkaline Phosphatase: 98 U/L (ref 38–126)
BUN: 13 mg/dL (ref 6–20)
CHLORIDE: 103 mmol/L (ref 101–111)
CO2: 30 mmol/L (ref 22–32)
Calcium: 9.1 mg/dL (ref 8.9–10.3)
Creatinine, Ser: 0.78 mg/dL (ref 0.61–1.24)
Glucose, Bld: 138 mg/dL — ABNORMAL HIGH (ref 65–99)
POTASSIUM: 3.9 mmol/L (ref 3.5–5.1)
Sodium: 139 mmol/L (ref 135–145)
TOTAL PROTEIN: 7.7 g/dL (ref 6.5–8.1)
Total Bilirubin: 0.6 mg/dL (ref 0.3–1.2)

## 2015-09-12 LAB — TROPONIN I

## 2015-09-12 NOTE — ED Notes (Signed)
Reports "infection in left leg is not getting better"

## 2015-09-12 NOTE — ED Notes (Signed)
Pt placed by pt relations desk for closer observation and knows to ask for help

## 2015-09-12 NOTE — Discharge Instructions (Signed)
You were evaluated for coughing, and feeling generalized weakness, and your exam and evaluation are reassuring in the emergency department today. Continue to finish your antibiotic.  Return to the emergency department for any fever, worsening cough or trouble breathing, chest pain, dizziness or passing out, altered mental status, one-sided weakness or numbness, concern for dehydration such as not making urine are dry mouth, redness or drainage to your legs or wound, or any other symptoms concerning to you.   Cough, Adult A cough helps to clear your throat and lungs. A cough may last only 2-3 weeks (acute), or it may last longer than 8 weeks (chronic). Many different things can cause a cough. A cough may be a sign of an illness or another medical condition. HOME CARE  Pay attention to any changes in your cough.  Take medicines only as told by your doctor.  If you were prescribed an antibiotic medicine, take it as told by your doctor. Do not stop taking it even if you start to feel better.  Talk with your doctor before you try using a cough medicine.  Drink enough fluid to keep your pee (urine) clear or pale yellow.  If the air is dry, use a cold steam vaporizer or humidifier in your home.  Stay away from things that make you cough at work or at home.  If your cough is worse at night, try using extra pillows to raise your head up higher while you sleep.  Do not smoke, and try not to be around smoke. If you need help quitting, ask your doctor.  Do not have caffeine.  Do not drink alcohol.  Rest as needed. GET HELP IF:  You have new problems (symptoms).  You cough up yellow fluid (pus).  Your cough does not get better after 2-3 weeks, or your cough gets worse.  Medicine does not help your cough and you are not sleeping well.  You have pain that gets worse or pain that is not helped with medicine.  You have a fever.  You are losing weight and you do not know why.  You have  night sweats. GET HELP RIGHT AWAY IF:  You cough up blood.  You have trouble breathing.  Your heartbeat is very fast.   This information is not intended to replace advice given to you by your health care provider. Make sure you discuss any questions you have with your health care provider.   Document Released: 10/29/2010 Document Revised: 11/06/2014 Document Reviewed: 04/24/2014 Elsevier Interactive Patient Education [employer].

## 2015-09-12 NOTE — ED Provider Notes (Signed)
[facility] Emergency Department Provider Note   ____________________________________________  Time seen: Approximately 3:25 PM I have reviewed the triage vital signs and the triage nursing note.  HISTORY  Chief Complaint Nausea and Wound Infection   Historian Patient and his ex-wife, they do not live together but she checks on him  HPI Russell [name] is a 50 y.o. male with morbid obesity, diabetes, right BKA, who reports that he is currently homeless as he is separated from his wife and he is awaiting a program to find him a place to live on Monday, presents here after about a week of cough for which he is on amoxicillin for 4 days now.  He has chronic lower extremity edema for which he is on Lasix 80 mg once a day and states that his leg is chronically red and swollen and it is no worse than usual. 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  He states he's had a history of a bloodstream infection for which he needed vancomycin and he is concerned with him feeling so much generalized weakness that perhaps he had recurrence.  Denies fevers and chills. Denies abdominal pain. Denies headache. Denies altered mental status.  He requests is there any way that we could just keep him over the weekend until he has his new living situation taken care of on Monday.   Past Medical History  Diagnosis Date  . Diabetes mellitus without complication ([place])   . Hypertension   . Depression     Patient Active Problem List   Diagnosis Date Noted  . Adjustment disorder with depressed mood 11/21/2014  . Other social stressor 11/21/2014  . 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 Diabetes ([place]) 11/21/2014    Past Surgical History  Procedure Laterality Date  . Below knee leg amputation      Right leg    Current Outpatient Rx  Name  Route  Sig  Dispense  Refill  . aspirin EC 81 MG tablet   Oral  Take 81 mg by mouth daily.         Marland Kitchen atorvastatin (LIPITOR) 10 MG tablet   Oral   Take 10 mg by mouth daily.         Marland Kitchen azelastine (OPTIVAR) 0.05 % ophthalmic solution   Both Eyes   Place 1 drop into both eyes 2 (two) times daily.         . busPIRone (BUSPAR) 7.5 MG tablet   Oral   Take 7.5 mg by mouth 3 (three) times daily.         . carvedilol (COREG) 3.125 MG tablet   Oral   Take 6.25 mg by mouth 2 (two) times daily.          . citalopram (CELEXA) 20 MG tablet   Oral   Take 20 mg by mouth daily.         . furosemide (LASIX) 40 MG tablet   Oral   Take 80 mg by mouth 2 (two) times daily.          Marland Kitchen gabapentin (NEURONTIN) 600 MG tablet   Oral   Take 600 mg by mouth 2 (two) times daily.          Marland Kitchen ibuprofen (ADVIL,MOTRIN) 600 MG tablet   Oral   Take 1 tablet (600 mg total) by mouth every 8 (eight) hours as needed.   20 tablet   0   .  insulin NPH Human (HUMULIN N,NOVOLIN N) 100 UNIT/ML injection   Subcutaneous   Inject 40 Units into the skin 2 (two) times daily. Pt uses 30 units in the morning and 20 units in the evening.         . insulin regular (NOVOLIN R,HUMULIN R) 100 units/mL injection   Subcutaneous   Inject 30 Units into the skin 2 (two) times daily before a meal.          . insulin regular (NOVOLIN R,HUMULIN R) 100 units/mL injection   Subcutaneous   Inject 20 Units into the skin daily.         Marland Kitchen lidocaine (XYLOCAINE) 5 % ointment   Topical   Apply 1 application topically as needed for mild pain.         Marland Kitchen lisinopril (PRINIVIL,ZESTRIL) 10 MG tablet   Oral   Take 10 mg by mouth daily.         Marland Kitchen lisinopril (PRINIVIL,ZESTRIL) 5 MG tablet   Oral   Take 5 mg by mouth daily.         . magnesium oxide (MAG-OX) 400 MG tablet   Oral   Take 400 mg by mouth 2 (two) times daily.         . metolazone (ZAROXOLYN) 2.5 MG tablet   Oral   Take 2.5 mg by mouth daily as needed (prior to Lasix if increased weight).         .  nitroGLYCERIN (NITROSTAT) 0.4 MG SL tablet   Sublingual   Place 0.4 mg under the tongue every 5 (five) minutes as needed for chest pain.         . potassium chloride SA (K-DUR,KLOR-CON) 20 MEQ tablet   Oral   Take 40 mEq by mouth 2 (two) times daily.          . simvastatin (ZOCOR) 20 MG tablet   Oral   Take 20 mg by mouth at bedtime.         . torsemide (DEMADEX) 100 MG tablet   Oral   Take 100 mg by mouth daily as needed (for edema).          . traZODone (DESYREL) 50 MG tablet   Oral   Take 25 mg by mouth at bedtime.         Marland Kitchen venlafaxine XR (EFFEXOR-XR) 150 MG 24 hr capsule   Oral   Take 150 mg by mouth 2 (two) times daily.           Allergies Shellfish allergy  No family history on file.  Social History Social History  Substance Use Topics  . Smoking status: Never Smoker   . Smokeless tobacco: None  . Alcohol Use: No    Review of Systems  Constitutional: Negative for fever. Eyes: Negative for visual changes. ENT: He had a sore throat for a few days and now that improving since she's been taking a cough medication which is over-the-counter. Cardiovascular: Negative for chest pain. Respiratory: Negative for shortness of breath, except when is having a coughing spell. Gastrointestinal: Negative for abdominal pain, vomiting and diarrhea. Genitourinary: Negative for dysuria. Musculoskeletal: Negative for back pain. Skin: Negative for rash. Neurological: Negative for headache. 10 point Review of Systems otherwise negative ____________________________________________   PHYSICAL EXAM:  VITAL SIGNS: ED Triage Vitals  Enc Vitals Group     BP 09/12/15 1241 133/78 mmHg     Pulse Rate 09/12/15 1241 90     Resp 09/12/15 1241 18  Temp 09/12/15 1241 98.4 F (36.9 C)     Temp Source 09/12/15 1241 Oral     SpO2 09/12/15 1241 100 %     Weight 09/12/15 1241 338 lb (153.316 kg)     Height 09/12/15 1241 5\' 10"  (1.778 m)     Head Cir --      Peak Flow  --      Pain Score 09/12/15 1242 5     Pain Loc --      Pain Edu? --      Excl. in [place]? --      Constitutional: Alert and oriented. Well appearing and in no distress. HEENT   Head: Normocephalic and atraumatic.      Eyes: Conjunctivae are normal. PERRL. Normal extraocular movements.      Ears:         Nose: No congestion/rhinnorhea.   Mouth/Throat: Mucous membranes are moist.   Neck: No stridor. Cardiovascular/Chest: Normal rate, regular rhythm.  No murmurs, rubs, or gallops. Respiratory: Normal respiratory effort without tachypnea nor retractions. Breath sounds are clear and equal bilaterally. No wheezes/rales/rhonchi. Gastrointestinal: Soft. No distention, no guarding, no rebound. Nontender.  Morbidly obese.  Genitourinary/rectal:Deferred Musculoskeletal: Right BKA. Stump a little pink without redness, or drainage. There is a small chronic-appearing nonhealing wound which is pretty superficial on the anterior aspect consistent with rubbing.  He has a few annular red lesions consistent with ringworm clinically.   Left lower extremity the patient has some pitting edema about 2+ and some chronic appearing lower extremity skin changes with redness without any superimposed cellulitis. Neurologic:  Normal speech and language. No gross or focal neurologic deficits are appreciated. Skin:  Skin is warm, dry and intact. No rash noted. Psychiatric: Mood and affect are normal. Speech and behavior are normal. Patient exhibits appropriate insight and judgment.  ____________________________________________   EKG I, Lisa Roca, MD, the attending physician have personally viewed and interpreted all ECGs.  None ____________________________________________  LABS (pertinent positives/negatives)  Labs Reviewed  CBC WITH DIFFERENTIAL/PLATELET - Abnormal; Notable for the following:    MCV 78.0 (*)    Neutro Abs 6.8 (*)    All other components within normal limits  COMPREHENSIVE METABOLIC  PANEL - Abnormal; Notable for the following:    Glucose, Bld 138 (*)    ALT 15 (*)    All other components within normal limits  CULTURE, BLOOD (ROUTINE X 2)  CULTURE, BLOOD (ROUTINE X 2)    ____________________________________________  RADIOLOGY All Xrays were viewed by me. Imaging interpreted by Radiologist.  Chest two-view:  IMPRESSION: No active cardiopulmonary disease.   Electronically Signed By: Lajean Manes M.D. On: 09/12/2015 16:49 __________________________________________  PROCEDURES  Procedure(s) performed: None  Critical Care performed: None  ____________________________________________   ED COURSE / ASSESSMENT AND PLAN  Pertinent labs & imaging results that were available during my care of the patient were reviewed by me and considered in my medical decision making (see chart for details).   This patient is here because he is not feeling well overall and he is currently on amoxicillin for upper respiratory cough, but essentially reports that he is improving overall since starting the antibiotic 4 days ago. He is currently homeless and staying at the shelter and it sounds like from a social perspective is really looking for a place to stay over the weekend and asking to be admitted.  In discussing the wound on his right stump and the leg swelling and redness to his left lower extremity, it sounds  like these are chronic and unchanged and do not appear to be superinfected to me. From his respiratory status he is able to speak in full sentences, has no significant wheezing or respiratory distress. He is currently on amoxicillin and asked him to go ahead and continue this course.  His laboratory studies are reassuring, with a slight left shift but normal white blood cell count. He's been afebrile and has stable vital signs and is overall well-appearing and I'm not suspicious at this point for bacteremia. However given his history the patient reports, I will go  ahead and draw blood cultures. He will be okay for outpatient follow-up and discharge from the emergency department today.      CONSULTATIONS:   none   Patient / Family / Caregiver informed of clinical course, medical decision-making process, and agree with plan.   I discussed return precautions, follow-up instructions, and discharged instructions with patient and/or family.   ___________________________________________   FINAL CLINICAL IMPRESSION(S) / ED DIAGNOSES   Final diagnoses:  Nonhealing nonsurgical wound limited to breakdown of skin  Cough              Note: This dictation was prepared with Dragon dictation. Any transcriptional errors that result from this process are unintentional   Lisa Roca, MD 09/12/15 1654

## 2015-09-17 LAB — CULTURE, BLOOD (ROUTINE X 2)
CULTURE: NO GROWTH
Culture: NO GROWTH

## 2015-10-10 ENCOUNTER — Ambulatory Visit: Payer: Medicare Other | Admitting: Surgery

## 2015-10-13 ENCOUNTER — Ambulatory Visit: Payer: Medicare Other | Admitting: Surgery

## 2015-10-20 ENCOUNTER — Ambulatory Visit: Payer: Medicare Other | Admitting: Surgery

## 2016-03-16 ENCOUNTER — Ambulatory Visit: Payer: Medicare Other | Admitting: Internal Medicine

## 2016-04-17 IMAGING — CR DG CHEST 1V
1 series · 2 of 2 positions shown · non-contrast
Comparison: 06/05/2014

CLINICAL DATA: Status post PICC line placement

EXAM:
CHEST  1 VIEW

[Series 1: ap · 0.17mm/px · 2 of 2 slices shown]
[im 1/2]
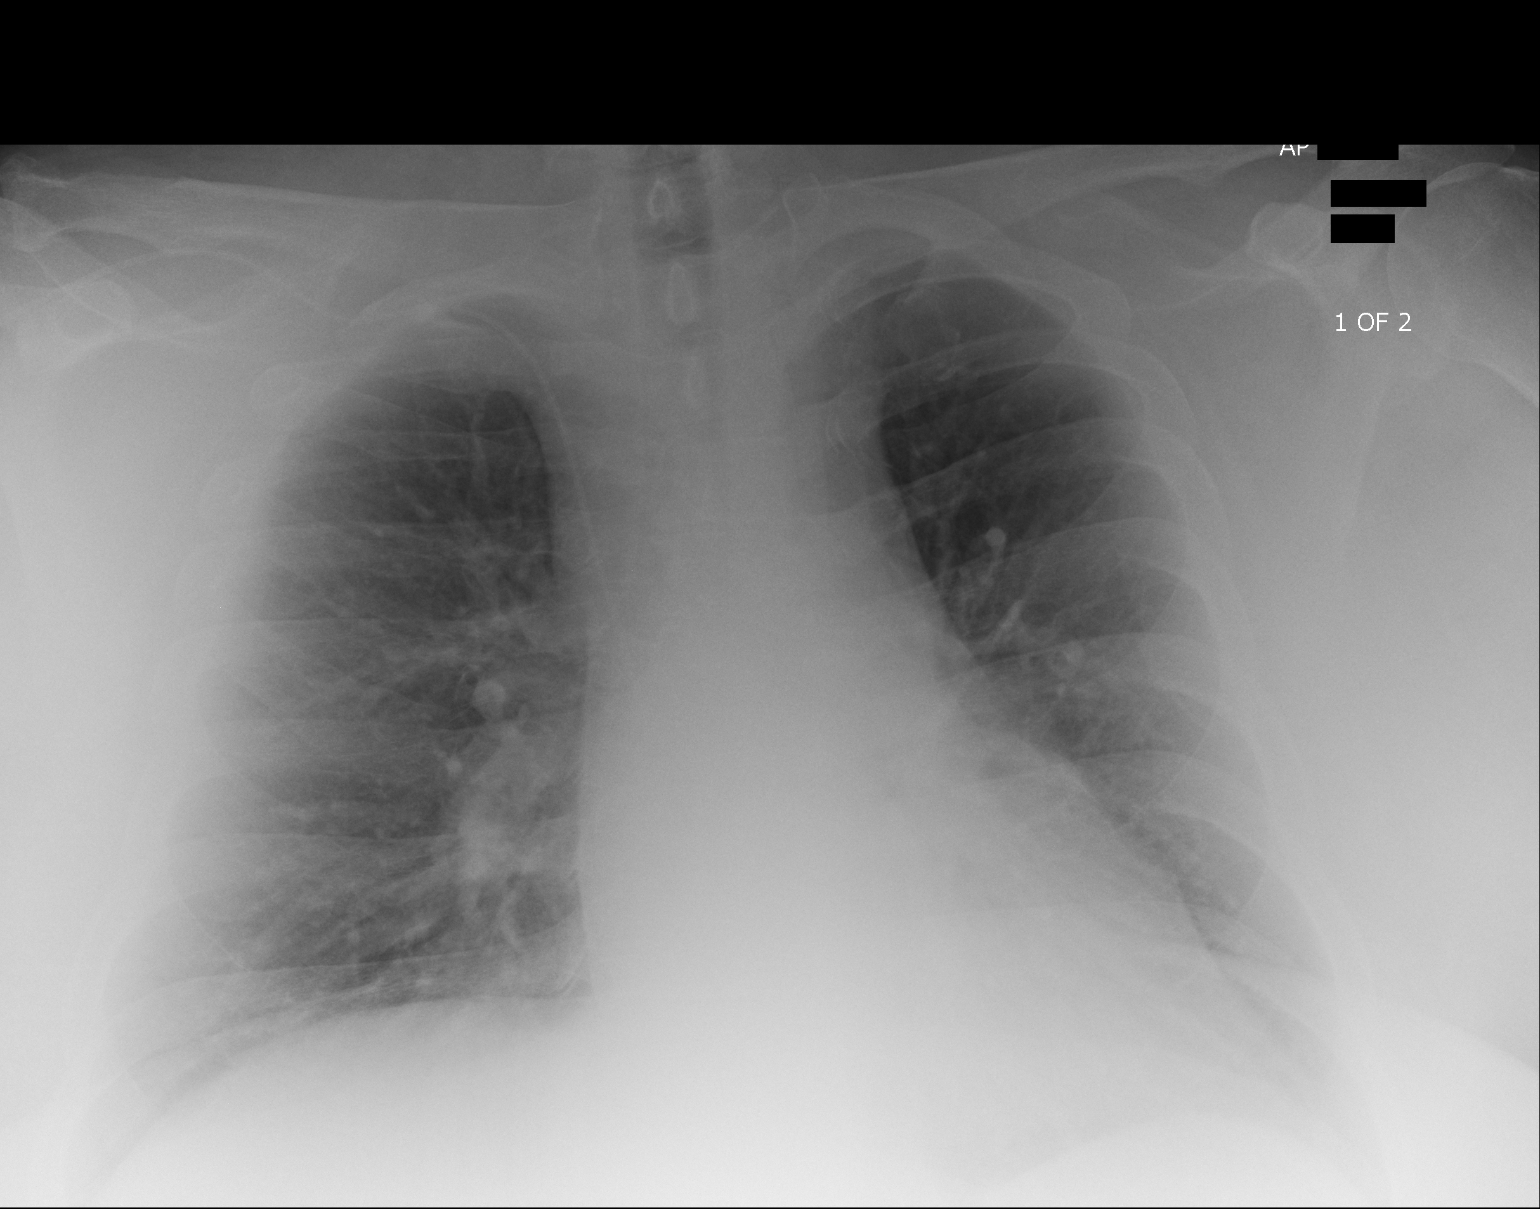
[im 2/2]
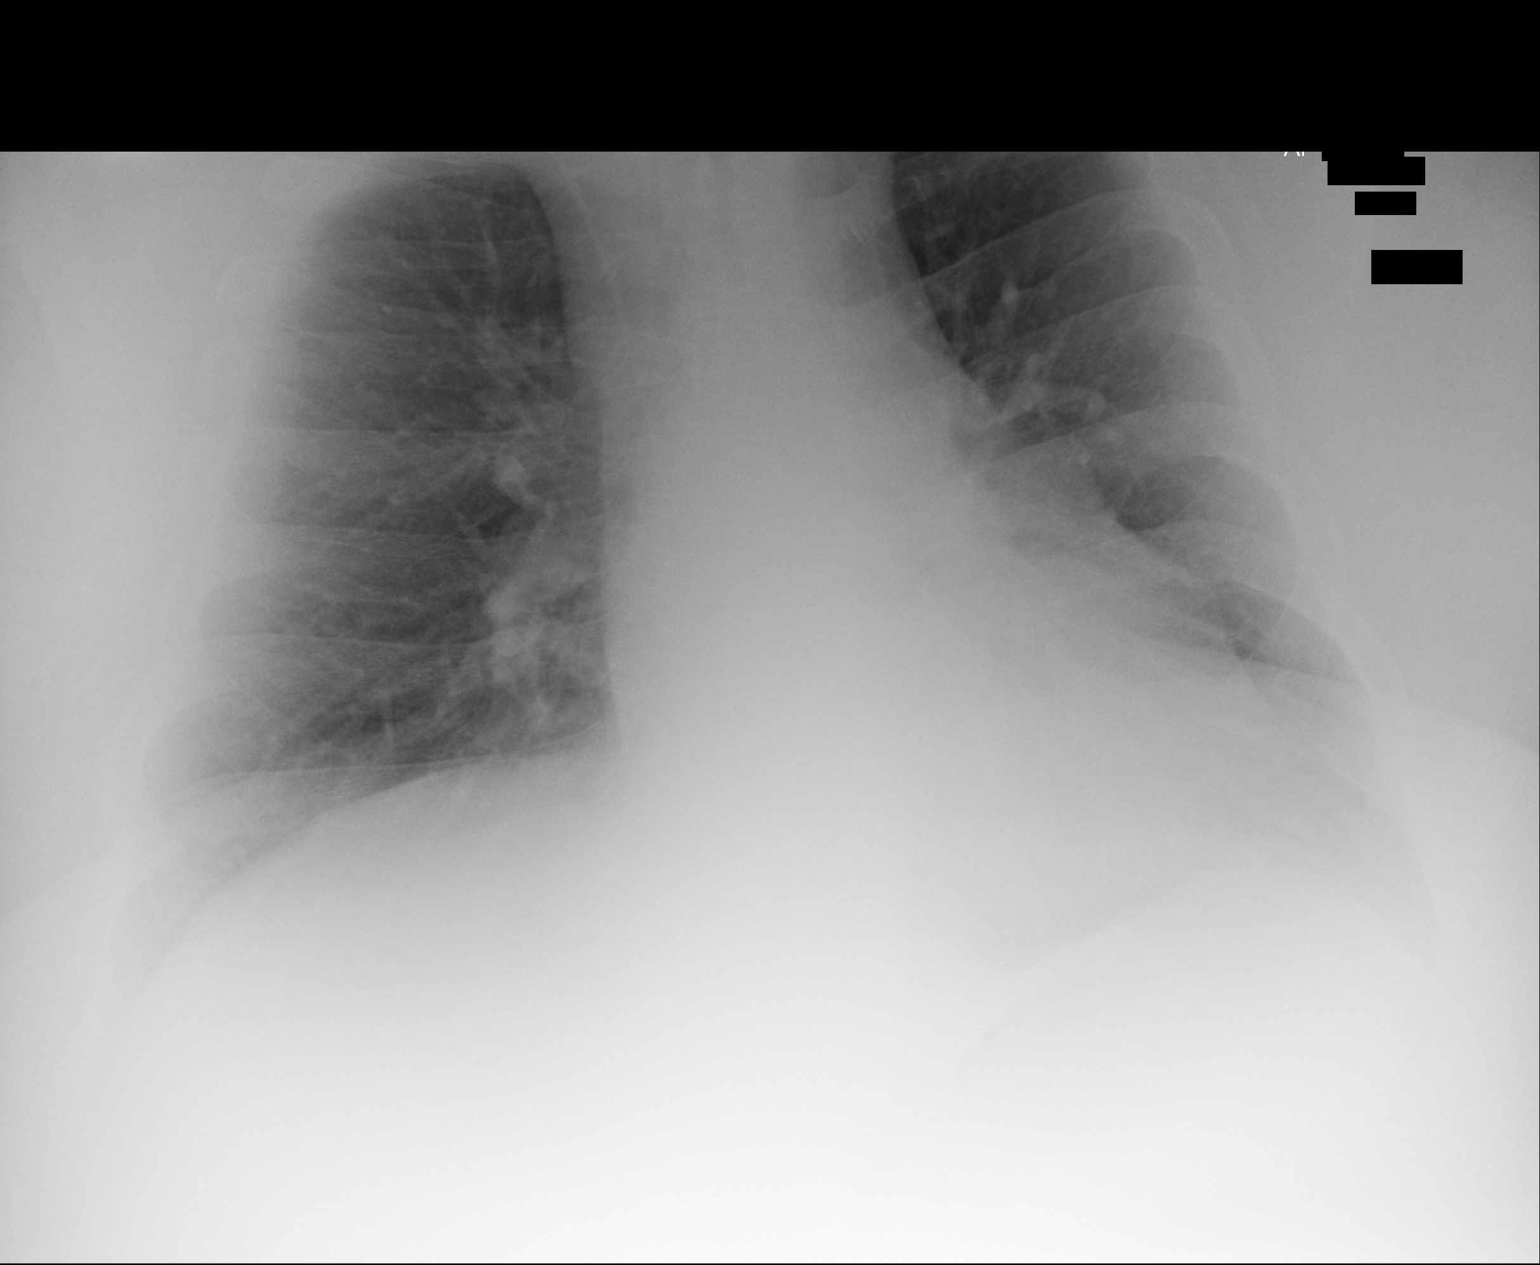

[2 of 2 positions shown; findings below may reference images not displayed]

FINDINGS: A right-sided PICC line is now in place. The catheter tip is noted
at the cavoatrial junction in satisfactory position the chest is
otherwise stable in appearance from the prior exam. No acute
abnormality is noted.
IMPRESSION: PICC line in satisfactory position.

## 2016-08-08 ENCOUNTER — Emergency Department: Payer: Medicare Other

## 2016-08-08 ENCOUNTER — Encounter: Payer: Self-pay | Admitting: Emergency Medicine

## 2016-08-08 ENCOUNTER — Inpatient Hospital Stay
Admission: EM | Admit: 2016-08-08 | Discharge: 2016-08-12 | DRG: 871 | Disposition: A | Discharge status: Skilled Nursing Facility | Payer: Medicare Other | Attending: Internal Medicine | Admitting: Internal Medicine

## 2016-08-08 DIAGNOSIS — L03119 Cellulitis of unspecified part of limb: Secondary | ICD-10-CM

## 2016-08-08 DIAGNOSIS — R739 Hyperglycemia, unspecified: Secondary | ICD-10-CM

## 2016-08-08 DIAGNOSIS — E11622 Type 2 diabetes mellitus with other skin ulcer: Secondary | ICD-10-CM | POA: Diagnosis present

## 2016-08-08 DIAGNOSIS — L97229 Non-pressure chronic ulcer of left calf with unspecified severity: Secondary | ICD-10-CM | POA: Diagnosis present

## 2016-08-08 DIAGNOSIS — R001 Bradycardia, unspecified: Secondary | ICD-10-CM | POA: Diagnosis not present

## 2016-08-08 DIAGNOSIS — R339 Retention of urine, unspecified: Secondary | ICD-10-CM | POA: Diagnosis not present

## 2016-08-08 DIAGNOSIS — L03116 Cellulitis of left lower limb: Secondary | ICD-10-CM

## 2016-08-08 DIAGNOSIS — E1165 Type 2 diabetes mellitus with hyperglycemia: Secondary | ICD-10-CM | POA: Diagnosis present

## 2016-08-08 DIAGNOSIS — A419 Sepsis, unspecified organism: Secondary | ICD-10-CM | POA: Diagnosis present

## 2016-08-08 DIAGNOSIS — N179 Acute kidney failure, unspecified: Secondary | ICD-10-CM | POA: Diagnosis not present

## 2016-08-08 DIAGNOSIS — Z1612 Extended spectrum beta lactamase (ESBL) resistance: Secondary | ICD-10-CM | POA: Diagnosis present

## 2016-08-08 DIAGNOSIS — I5043 Acute on chronic combined systolic (congestive) and diastolic (congestive) heart failure: Secondary | ICD-10-CM | POA: Diagnosis present

## 2016-08-08 DIAGNOSIS — R000 Tachycardia, unspecified: Secondary | ICD-10-CM

## 2016-08-08 DIAGNOSIS — I480 Paroxysmal atrial fibrillation: Secondary | ICD-10-CM | POA: Diagnosis present

## 2016-08-08 DIAGNOSIS — I878 Other specified disorders of veins: Secondary | ICD-10-CM | POA: Diagnosis present

## 2016-08-08 DIAGNOSIS — Z590 Homelessness: Secondary | ICD-10-CM | POA: Diagnosis not present

## 2016-08-08 DIAGNOSIS — Z89511 Acquired absence of right leg below knee: Secondary | ICD-10-CM

## 2016-08-08 DIAGNOSIS — E872 Acidosis: Secondary | ICD-10-CM | POA: Diagnosis present

## 2016-08-08 DIAGNOSIS — R0902 Hypoxemia: Secondary | ICD-10-CM | POA: Diagnosis present

## 2016-08-08 DIAGNOSIS — I110 Hypertensive heart disease with heart failure: Secondary | ICD-10-CM | POA: Diagnosis present

## 2016-08-08 DIAGNOSIS — E662 Morbid (severe) obesity with alveolar hypoventilation: Secondary | ICD-10-CM | POA: Diagnosis present

## 2016-08-08 DIAGNOSIS — I4892 Unspecified atrial flutter: Secondary | ICD-10-CM

## 2016-08-08 DIAGNOSIS — L97909 Non-pressure chronic ulcer of unspecified part of unspecified lower leg with unspecified severity: Secondary | ICD-10-CM

## 2016-08-08 DIAGNOSIS — L02416 Cutaneous abscess of left lower limb: Secondary | ICD-10-CM

## 2016-08-08 DIAGNOSIS — Z6841 Body Mass Index (BMI) 40.0 and over, adult: Secondary | ICD-10-CM | POA: Diagnosis not present

## 2016-08-08 DIAGNOSIS — L97929 Non-pressure chronic ulcer of unspecified part of left lower leg with unspecified severity: Secondary | ICD-10-CM

## 2016-08-08 DIAGNOSIS — L02419 Cutaneous abscess of limb, unspecified: Secondary | ICD-10-CM | POA: Diagnosis present

## 2016-08-08 DIAGNOSIS — Z91013 Allergy to seafood: Secondary | ICD-10-CM

## 2016-08-08 DIAGNOSIS — Z7982 Long term (current) use of aspirin: Secondary | ICD-10-CM

## 2016-08-08 DIAGNOSIS — E785 Hyperlipidemia, unspecified: Secondary | ICD-10-CM | POA: Diagnosis present

## 2016-08-08 DIAGNOSIS — Z794 Long term (current) use of insulin: Secondary | ICD-10-CM

## 2016-08-08 DIAGNOSIS — E1151 Type 2 diabetes mellitus with diabetic peripheral angiopathy without gangrene: Secondary | ICD-10-CM | POA: Diagnosis present

## 2016-08-08 HISTORY — DX: Hyperlipidemia, unspecified: E78.5

## 2016-08-08 HISTORY — DX: Heart failure, unspecified: I50.9

## 2016-08-08 LAB — CBC
HCT: 36.1 % — ABNORMAL LOW (ref 40.0–52.0)
Hemoglobin: 11.6 g/dL — ABNORMAL LOW (ref 13.0–18.0)
MCH: 26.4 pg (ref 26.0–34.0)
MCHC: 32.1 g/dL (ref 32.0–36.0)
MCV: 82.2 fL (ref 80.0–100.0)
PLATELETS: 304 10*3/uL (ref 150–440)
RBC: 4.39 MIL/uL — ABNORMAL LOW (ref 4.40–5.90)
RDW: 16.1 % — ABNORMAL HIGH (ref 11.5–14.5)
WBC: 9.9 10*3/uL (ref 3.8–10.6)

## 2016-08-08 LAB — BASIC METABOLIC PANEL
Anion gap: 11 (ref 5–15)
BUN: 18 mg/dL (ref 6–20)
CALCIUM: 8.8 mg/dL — AB (ref 8.9–10.3)
CHLORIDE: 94 mmol/L — AB (ref 101–111)
CO2: 29 mmol/L (ref 22–32)
CREATININE: 1.14 mg/dL (ref 0.61–1.24)
GFR calc non Af Amer: >60 mL/min (ref >60)
GLUCOSE: 496 mg/dL — AB (ref 65–99)
Potassium: 3.7 mmol/L (ref 3.5–5.1)
Sodium: 134 mmol/L — ABNORMAL LOW (ref 135–145)

## 2016-08-08 LAB — LACTIC ACID, PLASMA
Lactic Acid, Venous: 2.7 mmol/L (ref 0.5–1.9)
Lactic Acid, Venous: 2.5 mmol/L (ref 0.5–1.9)

## 2016-08-08 LAB — GLUCOSE, CAPILLARY
GLUCOSE-CAPILLARY: 468 mg/dL — AB (ref 65–99)
Glucose-Capillary: 413 mg/dL — ABNORMAL HIGH (ref 65–99)
Glucose-Capillary: 426 mg/dL — ABNORMAL HIGH (ref 65–99)
Glucose-Capillary: 305 mg/dL — ABNORMAL HIGH (ref 65–99)

## 2016-08-08 MED ORDER — VANCOMYCIN HCL IN DEXTROSE 1-5 GM/200ML-% IV SOLN: 1000.0000 mg | Freq: Once | INTRAVENOUS | Status: DC

## 2016-08-08 MED ORDER — ASPIRIN EC 81 MG PO TBEC
81.0000 mg | DELAYED_RELEASE_TABLET | Freq: Every day | ORAL | Status: DC
  Administered 2016-08-09 – 2016-08-12 (×4): 81 mg via ORAL
  Filled 2016-08-08 (×3): qty 1

## 2016-08-08 MED ORDER — SODIUM CHLORIDE 0.9 % IV BOLUS (SEPSIS)
1000.0000 mL | Freq: Once | INTRAVENOUS | Status: AC
  Administered 2016-08-08: 1000 mL via INTRAVENOUS

## 2016-08-08 MED ORDER — NITROGLYCERIN 0.4 MG SL SUBL: 0.4000 mg | SUBLINGUAL_TABLET | SUBLINGUAL | Status: DC | PRN

## 2016-08-08 MED ORDER — ENOXAPARIN SODIUM 40 MG/0.4ML ~~LOC~~ SOLN
40.0000 mg | Freq: Two times a day (BID) | SUBCUTANEOUS | Status: DC
  Administered 2016-08-08 – 2016-08-09 (×2): 40 mg via SUBCUTANEOUS
  Filled 2016-08-08 (×2): qty 0.4

## 2016-08-08 MED ORDER — ONDANSETRON HCL 4 MG/2ML IJ SOLN: 4.0000 mg | Freq: Four times a day (QID) | INTRAMUSCULAR | Status: DC | PRN

## 2016-08-08 MED ORDER — INSULIN ASPART 100 UNIT/ML ~~LOC~~ SOLN
0.0000 [IU] | Freq: Three times a day (TID) | SUBCUTANEOUS | Status: DC
  Administered 2016-08-09: 9 [IU] via SUBCUTANEOUS
  Filled 2016-08-08 (×2): qty 9

## 2016-08-08 MED ORDER — ACETAMINOPHEN 650 MG RE SUPP: 650.0000 mg | Freq: Four times a day (QID) | RECTAL | Status: DC | PRN

## 2016-08-08 MED ORDER — MORPHINE SULFATE (PF) 2 MG/ML IV SOLN
2.0000 mg | INTRAVENOUS | Status: DC | PRN
  Administered 2016-08-08 (×2): 2 mg via INTRAVENOUS
  Filled 2016-08-08 (×2): qty 1

## 2016-08-08 MED ORDER — ACETAMINOPHEN 325 MG PO TABS: 650.0000 mg | ORAL_TABLET | Freq: Four times a day (QID) | ORAL | Status: DC | PRN

## 2016-08-08 MED ORDER — BISACODYL 10 MG RE SUPP: 10.0000 mg | Freq: Every day | RECTAL | Status: DC | PRN

## 2016-08-08 MED ORDER — ATORVASTATIN CALCIUM 10 MG PO TABS
10.0000 mg | ORAL_TABLET | Freq: Every day | ORAL | Status: DC
  Administered 2016-08-08 – 2016-08-12 (×5): 10 mg via ORAL
  Filled 2016-08-08 (×4): qty 1

## 2016-08-08 MED ORDER — ONDANSETRON HCL 4 MG PO TABS: 4.0000 mg | ORAL_TABLET | Freq: Four times a day (QID) | ORAL | Status: DC | PRN

## 2016-08-08 MED ORDER — POTASSIUM CHLORIDE CRYS ER 20 MEQ PO TBCR
40.0000 meq | EXTENDED_RELEASE_TABLET | Freq: Two times a day (BID) | ORAL | Status: DC
  Administered 2016-08-08 – 2016-08-09 (×2): 40 meq via ORAL
  Filled 2016-08-08 (×2): qty 2

## 2016-08-08 MED ORDER — METOPROLOL SUCCINATE ER 25 MG PO TB24
25.0000 mg | ORAL_TABLET | Freq: Every day | ORAL | Status: DC
  Filled 2016-08-08: qty 1

## 2016-08-08 MED ORDER — VENLAFAXINE HCL ER 75 MG PO CP24
150.0000 mg | ORAL_CAPSULE | Freq: Two times a day (BID) | ORAL | Status: DC
  Administered 2016-08-09 – 2016-08-12 (×8): 150 mg via ORAL
  Filled 2016-08-08 (×7): qty 2

## 2016-08-08 MED ORDER — PIPERACILLIN-TAZOBACTAM 3.375 G IVPB
3.3750 g | Freq: Once | INTRAVENOUS | Status: AC
  Administered 2016-08-08: 3.375 g via INTRAVENOUS
  Filled 2016-08-08: qty 50

## 2016-08-08 MED ORDER — SODIUM CHLORIDE 0.9 % IV SOLN
INTRAVENOUS | Status: DC
  Administered 2016-08-08: 23:00:00 via INTRAVENOUS

## 2016-08-08 MED ORDER — INSULIN GLARGINE 100 UNIT/ML ~~LOC~~ SOLN
20.0000 [IU] | Freq: Every day | SUBCUTANEOUS | Status: DC
  Administered 2016-08-08: 20 [IU] via SUBCUTANEOUS
  Filled 2016-08-08 (×2): qty 0.2

## 2016-08-08 MED ORDER — PREGABALIN 50 MG PO CAPS
100.0000 mg | ORAL_CAPSULE | Freq: Three times a day (TID) | ORAL | Status: DC
  Administered 2016-08-08 – 2016-08-12 (×12): 100 mg via ORAL
  Filled 2016-08-08 (×11): qty 2

## 2016-08-08 MED ORDER — DOCUSATE SODIUM 100 MG PO CAPS
100.0000 mg | ORAL_CAPSULE | Freq: Two times a day (BID) | ORAL | Status: DC
  Administered 2016-08-08 – 2016-08-12 (×7): 100 mg via ORAL
  Filled 2016-08-08 (×7): qty 1

## 2016-08-08 MED ORDER — INSULIN ASPART 100 UNIT/ML ~~LOC~~ SOLN
10.0000 [IU] | Freq: Once | SUBCUTANEOUS | Status: AC
  Administered 2016-08-08: 10 [IU] via SUBCUTANEOUS
  Filled 2016-08-08: qty 10

## 2016-08-08 MED ORDER — METOPROLOL TARTRATE 25 MG PO TABS
25.0000 mg | ORAL_TABLET | Freq: Four times a day (QID) | ORAL | Status: DC
  Administered 2016-08-08 – 2016-08-09 (×3): 25 mg via ORAL
  Filled 2016-08-08 (×3): qty 1

## 2016-08-08 MED ORDER — PIPERACILLIN-TAZOBACTAM 4.5 G IVPB
4.5000 g | Freq: Three times a day (TID) | INTRAVENOUS | Status: DC
  Administered 2016-08-09: 4.5 g via INTRAVENOUS
  Filled 2016-08-08 (×3): qty 100

## 2016-08-08 MED ORDER — LISINOPRIL 10 MG PO TABS: 10.0000 mg | ORAL_TABLET | Freq: Every day | ORAL | Status: DC

## 2016-08-08 MED ORDER — FUROSEMIDE 40 MG PO TABS
40.0000 mg | ORAL_TABLET | Freq: Two times a day (BID) | ORAL | Status: DC
  Administered 2016-08-09: 40 mg via ORAL
  Filled 2016-08-08: qty 1

## 2016-08-08 MED ORDER — IPRATROPIUM-ALBUTEROL 0.5-2.5 (3) MG/3ML IN SOLN
3.0000 mL | Freq: Four times a day (QID) | RESPIRATORY_TRACT | Status: DC
  Administered 2016-08-08: 3 mL via RESPIRATORY_TRACT
  Filled 2016-08-08: qty 3

## 2016-08-08 MED ORDER — VANCOMYCIN HCL 10 G IV SOLR
1500.0000 mg | Freq: Once | INTRAVENOUS | Status: DC
  Filled 2016-08-08: qty 1500

## 2016-08-08 MED ORDER — VANCOMYCIN HCL 10 G IV SOLR
1250.0000 mg | Freq: Three times a day (TID) | INTRAVENOUS | Status: DC
  Administered 2016-08-09 – 2016-08-11 (×7): 1250 mg via INTRAVENOUS
  Filled 2016-08-08 (×9): qty 1250

## 2016-08-08 NOTE — ED Provider Notes (Signed)
[facility] Emergency Department Provider Note   ____________________________________________   First MD Initiated Contact with Patient 08/08/16 1708     (approximate)  I have reviewed the triage vital signs and the nursing notes.   HISTORY  Chief Complaint pt would like placement/speak to social worker and Hyperglycemia    HPI Russell [name] is a 51 y.o. male here for evaluation as he reports his wheelchair ran out of battery power is not able to get around.  As he is speaking about this, he also reports that he was recently hospitalized a few weeks ago in the state of [place]. He then was admitted for an infection in the left lower leg and an ulcer, he came home to [place] and sutured his own ulcer together. He's had significant swelling and redness in the left lower leg. Of note, he reports he went to [facility] and he reports that he was not satisfied with the care and left there against their advice. He has been on antibiotics previous for this, and reports that he feels the symptoms are actually getting slightly better.  Denies pain, but reports he has no sensation from the knee down due to diabetic nerve problems.  Denies any blue or cold foot.   Past Medical History:  Diagnosis Date  . CHF (congestive heart failure) ([place])   . Depression   . Diabetes mellitus without complication ([place])   . Hyperlipemia   . Hypertension     Patient Active Problem List   Diagnosis Date Noted  . Cellulitis and abscess of left leg 08/08/2016  . Diabetes with ulcer of leg ([place]) 08/08/2016  . Hyperglycemia 08/08/2016  . Morbid obesity ([place]) 08/08/2016  . Cellulitis and abscess of leg 08/08/2016  . Adjustment disorder with depressed mood 11/21/2014  . Other social stressor 11/21/2014  . 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 Diabetes ([place]) 11/21/2014    Past Surgical History:  Procedure Laterality Date  . BELOW KNEE LEG AMPUTATION     Right  leg    Prior to Admission medications   Medication Sig Start Date End Date Taking? Authorizing Provider  aspirin EC 81 MG tablet Take 81 mg by mouth daily.   Yes [provider]  atorvastatin (LIPITOR) 10 MG tablet Take 10 mg by mouth daily.   Yes [provider]  furosemide (LASIX) 40 MG tablet Take 40 mg by mouth 2 (two) times daily.    Yes [provider]  insulin regular (NOVOLIN R,HUMULIN R) 100 units/mL injection Inject 40-80 Units into the skin 3 (three) times daily before meals. Depending on blood sugar   Yes [provider]  lidocaine (XYLOCAINE) 5 % ointment Apply 1 application topically as needed for mild pain.   Yes [provider]  lisinopril (PRINIVIL,ZESTRIL) 10 MG tablet Take 10 mg by mouth daily.   Yes [provider]  metolazone (ZAROXOLYN) 2.5 MG tablet Take 2.5 mg by mouth daily as needed (prior to Lasix if increased weight).   Yes [provider]  metoprolol succinate (TOPROL-XL) 25 MG 24 hr tablet Take 25 mg by mouth daily.   Yes [provider]  nitroGLYCERIN (NITROSTAT) 0.4 MG SL tablet Place 0.4 mg under the tongue every 5 (five) minutes as needed for chest pain.   Yes [provider]  potassium chloride SA (K-DUR,KLOR-CON) 20 MEQ tablet Take 40 mEq by mouth 2 (two) times daily.    Yes [provider]  pregabalin (LYRICA) 100 MG  capsule Take 100 mg by mouth 3 (three) times daily.   Yes [provider]  torsemide (DEMADEX) 100 MG tablet Take 100 mg by mouth daily as needed (for edema).    Yes [provider]  busPIRone (BUSPAR) 7.5 MG tablet Take 7.5 mg by mouth 3 (three) times daily.    [provider]  citalopram (CELEXA) 20 MG tablet Take 20 mg by mouth daily.    [provider]  ibuprofen (ADVIL,MOTRIN) 600 MG tablet Take 1 tablet (600 mg total) by mouth every 8 (eight) hours as needed. Patient not taking: Reported on 08/08/2016 02/21/15    Nance Pear, MD  venlafaxine XR (EFFEXOR-XR) 150 MG 24 hr capsule Take 150 mg by mouth 2 (two) times daily.    [provider]    Allergies Shellfish allergy  History reviewed. No pertinent family history.  Social History Social History  Substance Use Topics  . Smoking status: Never Smoker  . Smokeless tobacco: Never Used  . Alcohol use No    Review of Systems Constitutional: No fever/chills Eyes: No visual changes. ENT: No sore throat. Cardiovascular: Denies chest pain. Respiratory: Denies shortness of breath. Gastrointestinal: No abdominal pain.  No nausea, no vomiting.  No diarrhea.  No constipation. Genitourinary: Negative for dysuria. Musculoskeletal: Negative for back pain. Skin: See history of present illness Neurological: Negative for headaches, focal weakness or numbness.    ____________________________________________   PHYSICAL EXAM:  VITAL SIGNS: ED Triage Vitals  Enc Vitals Group     BP 08/08/16 1431 132/82     Pulse Rate 08/08/16 1428 (!) 135     Resp 08/08/16 1428 (!) 24     Temp 08/08/16 1428 98.3 F (36.8 C)     Temp Source 08/08/16 1428 Oral     SpO2 08/08/16 1428 100 %     Weight 08/08/16 1430 (!) 378 lb (171.5 kg)     Height 08/08/16 1430 5\' 11"  (1.803 m)     Head Circumference --      Peak Flow --      Pain Score --      Pain Loc --      Pain Edu? --      Excl. in [place]? --     Constitutional: Alert and oriented. Well appearing and in no acute distress. Eyes: Conjunctivae are normal. Head: Atraumatic. Nose: No congestion/rhinnorhea. Mouth/Throat: Mucous membranes are moist. Neck: No stridor.   Cardiovascular: Mildly tachycardicrate, regular rhythm. Grossly normal heart sounds.  Good peripheral circulation. Respiratory: Normal respiratory effort.  No retractions. Lungs CTAB. Gastrointestinal: Soft and nontender. No distention. 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 Very minimal, likely a chronic type change.  Left lower extremity demonstrates an obvious ulcer over the left anterior tib-fib with circumferential edema erythema and warmth with some induration of the skin. The patient has placed his own sutures in, and some slight serous to questionably purulent drainage is noted. The patient has intact capillary refill in left lower extremity, with no evidence of acute ischemia. He has no sensation from about the left knee inferiorly, and reports is chronic. Neurologic:  Normal speech and language. No gross focal neurologic deficits are appreciated.  Skin:  Skin is warm, dry and intact. No rash noted. Psychiatric: Mood and affect are normal. Speech and behavior are normal. he does speak somewhat of an unusual travel history to [place], and checking out  [facility] ADV[name]. He is well oriented, denies any thoughts point harm himself or anyone else. He is agreeable with the care he is receiving here.  ____________________________________________   LABS (all labs ordered are listed, but only abnormal results are displayed)  Labs Reviewed  BASIC METABOLIC PANEL - Abnormal; Notable for the following:       Result Value   Sodium 134 (*)    Chloride 94 (*)    Glucose, Bld 496 (*)    Calcium 8.8 (*)    All other components within normal limits  CBC - Abnormal; Notable for the following:    RBC 4.39 (*)    Hemoglobin 11.6 (*)    HCT 36.1 (*)    RDW 16.1 (*)    All other components within normal limits  URINALYSIS, COMPLETE (UACMP) WITH MICROSCOPIC - Abnormal; Notable for the following:    Color, Urine STRAW (*)    APPearance CLEAR (*)    Glucose, UA >=500 (*)    Squamous Epithelial / LPF 0-5 (*)    All other components within normal limits  GLUCOSE, CAPILLARY - Abnormal; Notable for the following:    Glucose-Capillary 468 (*)    All other components within normal limits    LACTIC ACID, PLASMA - Abnormal; Notable for the following:    Lactic Acid, Venous 2.7 (*)    All other components within normal limits  LACTIC ACID, PLASMA - Abnormal; Notable for the following:    Lactic Acid, Venous 2.5 (*)    All other components within normal limits  GLUCOSE, CAPILLARY - Abnormal; Notable for the following:    Glucose-Capillary 413 (*)    All other components within normal limits  GLUCOSE, CAPILLARY - Abnormal; Notable for the following:    Glucose-Capillary 426 (*)    All other components within normal limits  COMPREHENSIVE METABOLIC PANEL - Abnormal; Notable for the following:    Chloride 98 (*)    Glucose, Bld 344 (*)    Calcium 8.2 (*)    Albumin 2.9 (*)    All other components within normal limits  CBC - Abnormal; Notable for the following:    RBC 4.03 (*)    Hemoglobin 10.6 (*)    HCT 32.5 (*)    RDW 16.4 (*)    All other components within normal limits  GLUCOSE, CAPILLARY - Abnormal; Notable for the following:    Glucose-Capillary 305 (*)    All other components within normal limits  HEMOGLOBIN A1C - Abnormal; Notable for the following:    Hgb A1c MFr Bld 9.7 (*)    All other components within normal limits  GLUCOSE, CAPILLARY - Abnormal; Notable for the following:    Glucose-Capillary 358 (*)    All other components within normal limits  GLUCOSE, CAPILLARY - Abnormal; Notable for the following:    Glucose-Capillary 352 (*)    All other components within normal limits  BLOOD GAS, ARTERIAL - Abnormal; Notable for the following:    pH, Arterial 7.48 (*)    pO2, Arterial 75 (*)    Bicarbonate 34.3 (*)    Acid-Base Excess 9.6 (*)    All other components within normal limits  GLUCOSE, CAPILLARY - Abnormal; Notable for the following:    Glucose-Capillary 181 (*)    All other components within normal limits  CBC - Abnormal; Notable for the following:    RBC 3.91 (*)    Hemoglobin 10.3 (*)    HCT 31.7 (*)  RDW 16.7 (*)    All other components  within normal limits  HEPARIN LEVEL (UNFRACTIONATED) - Abnormal; Notable for the following:    Heparin Unfractionated 0.15 (*)    All other components within normal limits  BASIC METABOLIC PANEL - Abnormal; Notable for the following:    Chloride 99 (*)    CO2 33 (*)    Glucose, Bld 153 (*)    Calcium 8.0 (*)    All other components within normal limits  GLUCOSE, CAPILLARY - Abnormal; Notable for the following:    Glucose-Capillary 188 (*)    All other components within normal limits  HEPARIN LEVEL (UNFRACTIONATED) - Abnormal; Notable for the following:    Heparin Unfractionated 0.27 (*)    All other components within normal limits  GLUCOSE, CAPILLARY - Abnormal; Notable for the following:    Glucose-Capillary 174 (*)    All other components within normal limits  GLUCOSE, CAPILLARY - Abnormal; Notable for the following:    Glucose-Capillary 223 (*)    All other components within normal limits  GLUCOSE, CAPILLARY - Abnormal; Notable for the following:    Glucose-Capillary 177 (*)    All other components within normal limits  CBC - Abnormal; Notable for the following:    RBC 3.93 (*)    Hemoglobin 10.6 (*)    HCT 32.7 (*)    RDW 17.0 (*)    All other components within normal limits  BASIC METABOLIC PANEL - Abnormal; Notable for the following:    Sodium 134 (*)    Potassium 5.3 (*)    Chloride 98 (*)    Glucose, Bld 145 (*)    BUN 22 (*)    Creatinine, Ser 1.49 (*)    Calcium 8.1 (*)    GFR calc non Af Amer 52 (*)    All other components within normal limits  GLUCOSE, CAPILLARY - Abnormal; Notable for the following:    Glucose-Capillary 145 (*)    All other components within normal limits  BASIC METABOLIC PANEL - Abnormal; Notable for the following:    Sodium 134 (*)    Chloride 98 (*)    Glucose, Bld 150 (*)    BUN 22 (*)    Creatinine, Ser 1.69 (*)    Calcium 8.2 (*)    GFR calc non Af Amer 45 (*)    GFR calc Af Amer 52 (*)    All other components within normal  limits  GLUCOSE, CAPILLARY - Abnormal; Notable for the following:    Glucose-Capillary 138 (*)    All other components within normal limits  VANCOMYCIN, TROUGH - Abnormal; Notable for the following:    Vancomycin Tr 37 (*)    All other components within normal limits  GLUCOSE, CAPILLARY - Abnormal; Notable for the following:    Glucose-Capillary 130 (*)    All other components within normal limits  GLUCOSE, CAPILLARY - Abnormal; Notable for the following:    Glucose-Capillary 139 (*)    All other components within normal limits  CULTURE, BLOOD (ROUTINE X 2)  CULTURE, BLOOD (ROUTINE X 2)  MRSA PCR SCREENING  AEROBIC CULTURE (SUPERFICIAL SPECIMEN)  CULTURE, EXPECTORATED SPUTUM-ASSESSMENT  HIV ANTIBODY (ROUTINE TESTING)  APTT  PROTIME-INR  VANCOMYCIN, TROUGH  CREATININE, SERUM  GLUCOSE, CAPILLARY  BASIC METABOLIC PANEL  CBG MONITORING, ED  CBG MONITORING, ED   ____________________________________________  EKG  Reviewed and interpreted by me at 1925 Ventricular rate 1:30 Triage 100 QTc 416 Atrial flutter, 2-1 block, no evidence of obvious  focal ischemia though there are diffuse T-wave inversions inferolateral distribution ____________________________________________  RADIOLOGY  No results found.  ____________________________________________   PROCEDURES  Procedure(s) performed: None  Procedures  Critical Care performed: No  ____________________________________________   INITIAL IMPRESSION / ASSESSMENT AND PLAN / ED COURSE  Pertinent labs & imaging results that were available during my care of the patient were reviewed by me and considered in my medical decision making (see chart for details).  Patient since initially for concerns of not having operating battery in his wheelchair, however quickly noticed to have tachycardia, mild tachypnea, and in obvious infected left lower extremity which he is sought treatment for it twice once in [place], once at [facility] and reports having received antibiotics. He self sutured, and the wound is indurated, erythematous and involving the majority of his left lower extremity from the tibial plateau down. I am very concerned about a resistant infection in the region.  ----------------------------------------- 6:20 PM on 08/08/2016 -----------------------------------------  Some tachycardia and tachypnea, so she was left lower extremity infection meet criteria for sepsis. I discussed lactic acid result, fluids, and EKG findings of Aflutter discussed with Dr. Doy Hutching and the patient will be admitted to hospitalist service with probable need for surgical consultation for possible wound care needs and further workup of condition.  I discussed with Dr. Doy Hutching the patient's EKG which demonstrated atrial flutter. He advised that he will provide treatment and is aware. Patient receiving IV fluids for elevated lactate, antibiotics and appears to be resting comfortably at this time.        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     Delman Kitten, MD 08/11/16 2105

## 2016-08-08 NOTE — Progress Notes (Signed)
Pharmacist - Prescriber Communication  Enoxaparin dose has been modified to 40 mg subcutaneously BID due to BMI > 40.  Lenea [place]water A. Higg[place], [place].D., BCPS Clinical Pharmacist 08/08/2016 21:21

## 2016-08-08 NOTE — ED Notes (Signed)
Patient was sleeping upon this writer's arrival. Patient was calm and cooperative with unwrapping LLE.

## 2016-08-08 NOTE — ED Triage Notes (Addendum)
Pt here because today his wheelchair broke and he would like to speak to social worker about going back to a facility.  Came via EMS.  Blood sugar 551 with EMS. Pt reports has not taken his 80 units of insulin today because meter also broken. Does not want IV because reports he just needs his insulin.  Tachy but does not want work up for this, reports just took meds.  No distress. Pt only need is wants Education officer, museum eval for placement.  Pt has heart failure with 30-35 EF from echo in may

## 2016-08-08 NOTE — H&P (Signed)
History and Physical    Russell [name] DXA:[id number] DOB: 21-Apr-1965 DOA: 08/08/2016  Referring physician: Dr. Jacqualine Code PCP: System, Pcp Not In  Specialists: none  Chief Complaint: leg pain  HPI: Russell [name] is a 51 y.o. male has a past medical history significant for DM, PVD, and morbid obesity now with progressive LLE redness and pain despite po ABX. In ER, sugars very high. LLE ulcer/abcess with diffuse edema and erythema noted. He is now admitted. No fever. Denies CP or SOB. Has not taken his insulin regualrly  Review of Systems: The patient denies anorexia, fever, weight loss,, vision loss, decreased hearing, hoarseness, chest pain, syncope, dyspnea on exertion,  balance deficits, hemoptysis, abdominal pain, melena, hematochezia, severe indigestion/heartburn, hematuria, incontinence, genital sores, muscle weakness, transient blindness,  depression, unusual weight change, abnormal bleeding, enlarged lymph nodes, angioedema, and breast masses.   Past Medical History:  Diagnosis Date  . CHF (congestive heart failure) ([place])   . Depression   . Diabetes mellitus without complication ([place])   . Hyperlipemia   . Hypertension    Past Surgical History:  Procedure Laterality Date  . BELOW KNEE LEG AMPUTATION     Right leg   Social History:  reports that he has never smoked. He has never used smokeless tobacco. He reports that he does not drink alcohol. His drug history is not on file.  Allergies  Allergen Reactions  . Shellfish Allergy Hives    History reviewed. No pertinent family history.  Prior to Admission medications   Medication Sig Start Date End Date Taking? Authorizing Provider  aspirin EC 81 MG tablet Take 81 mg by mouth daily.    [provider]  atorvastatin (LIPITOR) 10 MG tablet Take 10 mg by mouth daily.    [provider]  busPIRone (BUSPAR) 7.5 MG tablet Take 7.5 mg by mouth 3 (three) times daily.    [provider]  carvedilol (COREG)  3.125 MG tablet Take 6.25 mg by mouth 2 (two) times daily.     [provider]  citalopram (CELEXA) 20 MG tablet Take 20 mg by mouth daily.    [provider]  furosemide (LASIX) 40 MG tablet Take 80 mg by mouth 2 (two) times daily.     [provider]  gabapentin (NEURONTIN) 600 MG tablet Take 600 mg by mouth 2 (two) times daily.     [provider]  ibuprofen (ADVIL,MOTRIN) 600 MG tablet Take 1 tablet (600 mg total) by mouth every 8 (eight) hours as needed. 02/21/15   Nance Pear, MD  insulin regular (NOVOLIN R,HUMULIN R) 100 units/mL injection Inject 30 Units into the skin 2 (two) times daily before a meal.     [provider]  lidocaine (XYLOCAINE) 5 % ointment Apply 1 application topically as needed for mild pain.    [provider]  lisinopril (PRINIVIL,ZESTRIL) 10 MG tablet Take 10 mg by mouth daily.    [provider]  lisinopril (PRINIVIL,ZESTRIL) 5 MG tablet Take 5 mg by mouth daily.    [provider]  magnesium oxide (MAG-OX) 400 MG tablet Take 400 mg by mouth 2 (two) times daily.    [provider]  metolazone (ZAROXOLYN) 2.5 MG tablet Take 2.5 mg by mouth daily as needed (prior to Lasix if increased weight).    [provider]  nitroGLYCERIN (NITROSTAT) 0.4 MG SL tablet Place 0.4 mg under the tongue every 5 (five) minutes as needed for chest pain.    [provider]  potassium chloride SA (K-DUR,KLOR-CON) 20 MEQ tablet Take 40 mEq by mouth 2 (two) times daily.     [provider]  simvastatin (ZOCOR) 20 MG tablet Take 20 mg by mouth at bedtime.    [provider]  torsemide (DEMADEX) 100 MG tablet Take 100 mg by mouth daily as needed (for edema).     [provider]  traZODone (DESYREL) 50 MG tablet Take 25 mg by mouth at bedtime.    [provider]  venlafaxine XR (EFFEXOR-XR) 150 MG 24 hr capsule Take 150 mg by mouth 2 (two) times daily.     [provider]   Physical Exam: Vitals:   08/08/16 1428 08/08/16 1430 08/08/16 1431  BP:   132/82  Pulse: (!) 135    Resp: (!) 24    Temp: 98.3 F (36.8 C)    TempSrc: Oral    SpO2: 100%    Weight:  (!) 171.5 kg (378 lb)   Height:  5\' 11"  (1.803 m)      General:  No apparent distress, obese, [place]/AT  Eyes: PERRL, EOMI, no scleral icterus, conjunctiva clear  ENT: moist oropharynx without exudate, TM's benign, dentition fair  Neck: supple, no lymphadenopathy. No bruits or thyromegaly  Cardiovascular: regular rate without MRG; 2+ peripheral pulses, no JVD, 3+ peripheral edema  Respiratory: CTA biL, good air movement without wheezing, rhonchi or crackled. Respiratory effort normal  Abdomen: soft, non tender to palpation, positive bowel sounds, no guarding, no rebound  Skin: diffuse erythema with warmth and tenderness of LLE from mid-shin down with abcess/ulceration noted.  Musculoskeletal: normal bulk and tone, no joint swelling. Right BKA noted  Psychiatric: normal mood and affect, A&OX3  Neurologic: CN 2-12 grossly intact, Motor strength 5/5 in all 4 groups with symmetric DTR's and stocking glove neuropathy noted  Labs on Admission:  Basic Metabolic Panel:  Recent Labs Lab 08/08/16 1431  NA 134*  K 3.7  CL 94*  CO2 29  GLUCOSE 496*  BUN 18  CREATININE 1.14  CALCIUM 8.8*   Liver Function Tests: No results for input(s): AST, ALT, ALKPHOS, BILITOT, PROT, ALBUMIN in the last 168 hours. No results for input(s): LIPASE, AMYLASE in the last 168 hours. No results for input(s): AMMONIA in the last 168 hours. CBC:  Recent Labs Lab 08/08/16 1431  WBC 9.9  HGB 11.6*  HCT 36.1*  MCV 82.2  PLT 304   Cardiac Enzymes: No results for input(s): CKTOTAL, CKMB, CKMBINDEX, TROPONINI in the last 168 hours.  BNP (last 3 results) No results for input(s): BNP in the last 8760 hours.  ProBNP (last 3 results) No results for input(s): PROBNP in the last 8760  hours.  CBG:  Recent Labs Lab 08/08/16 1436  GLUCAP 468*    Radiological Exams on Admission: Dg Tibia/fibula Left  Result Date: 08/08/2016 CLINICAL DATA:  Left lower leg swelling for 2-3 days. No known injury. EXAM: LEFT TIBIA AND FIBULA - 2 VIEW COMPARISON:  None. FINDINGS: There is no acute bony or joint abnormality. Remote healed fracture of the distal diaphysis of the fibula is noted. Soft tissues of the lower leg and imaged foot are swollen. No soft tissue gas collection or radiopaque foreign body. IMPRESSION: Diffuse lower leg and foot soft tissue swelling consistent with dependent change or cellulitis. Remote healed distal fibular fracture. Electronically Signed   By: Inge Rise M.D.   On: 08/08/2016 17:49    EKG: Independently reviewed.  Assessment/Plan Principal Problem:  Cellulitis and abscess of left leg Active Problems:   Diabetes with ulcer of leg ([place])   Hyperglycemia   Morbid obesity ([place])   Will admit to floor with IV ABX. Cultures sent. Monitor sugars and add SSI. Consult Surgery. Repeat labs in AM. IV fluids started.  Diet: low carb Fluids: NS@100  DVT Prophylaxis: Lovenox  Code Status: FULL Family Communication: none  Disposition Plan: home  Time spent: 55 min

## 2016-08-08 NOTE — Progress Notes (Addendum)
Pharmacy Antibiotic Note  KAIEN PEZZULLO is a 51 y.o. male admitted on 08/08/2016 with cellulitis.  Pharmacy has been consulted for Zosyn and vancomycin dosing.  Plan: 1. Zosyn 4.5 gm IV Q8H EI 2. Vancomycin 1.5 gm IV x 1 in ED followed in approximately 6 hours (stacked dosing) by vancomycin 1.25 gm IV Q8H. Pharmacy will continue to follow and adjust as needed to maintain trough 15 to 20 mcg/mL.   Vd 79.6 L, Ke 0.106 hr-1, T1/2 6.6 hr  Height: 5\' 11"  (180.3 cm) Weight: (!) 378 lb (171.5 kg) IBW/kg (Calculated) : 75.3  Temp (24hrs), Avg:98.3 F (36.8 C), Min:98.3 F (36.8 C), Max:98.3 F (36.8 C)   Recent Labs Lab 08/08/16 1431  WBC 9.9  CREATININE 1.14    Estimated Creatinine Clearance: 122 mL/min (by C-G formula based on SCr of 1.14 mg/dL).    Allergies  Allergen Reactions  . Shellfish Allergy Hives    Thank you for allowing pharmacy to be a part of this patient's care.  Laural Benes, Pharm.D., BCPS Clinical Pharmacist 08/08/2016 6:27 PM

## 2016-08-08 NOTE — ED Notes (Signed)
Dr. Jacqualine Code informed of Lactic Acid of 2.7

## 2016-08-09 ENCOUNTER — Inpatient Hospital Stay: Payer: Medicare Other

## 2016-08-09 LAB — URINALYSIS, COMPLETE (UACMP) WITH MICROSCOPIC
BACTERIA UA: NONE SEEN
BILIRUBIN URINE: NEGATIVE
Glucose, UA: >=500 mg/dL — AB
Hgb urine dipstick: NEGATIVE
Ketones, ur: NEGATIVE mg/dL
Leukocytes, UA: NEGATIVE
NITRITE: NEGATIVE
PH: 7 (ref 5.0–8.0)
Protein, ur: NEGATIVE mg/dL
SPECIFIC GRAVITY, URINE: 1.014 (ref 1.005–1.030)

## 2016-08-09 LAB — GLUCOSE, CAPILLARY
GLUCOSE-CAPILLARY: 358 mg/dL — AB (ref 65–99)
GLUCOSE-CAPILLARY: 352 mg/dL — AB (ref 65–99)
GLUCOSE-CAPILLARY: 181 mg/dL — AB (ref 65–99)
Glucose-Capillary: 188 mg/dL — ABNORMAL HIGH (ref 65–99)

## 2016-08-09 LAB — CBC
HEMATOCRIT: 32.5 % — AB (ref 40.0–52.0)
HEMOGLOBIN: 10.6 g/dL — AB (ref 13.0–18.0)
MCH: 26.4 pg (ref 26.0–34.0)
MCHC: 32.7 g/dL (ref 32.0–36.0)
MCV: 80.6 fL (ref 80.0–100.0)
Platelets: 273 10*3/uL (ref 150–440)
RBC: 4.03 MIL/uL — AB (ref 4.40–5.90)
RDW: 16.4 % — ABNORMAL HIGH (ref 11.5–14.5)
WBC: 7.7 10*3/uL (ref 3.8–10.6)

## 2016-08-09 LAB — BLOOD GAS, ARTERIAL
ACID-BASE EXCESS: 9.6 mmol/L — AB (ref 0.0–2.0)
BICARBONATE: 34.3 mmol/L — AB (ref 20.0–28.0)
FIO2: 0.21
O2 Saturation: 95.9 %
PH ART: 7.48 — AB (ref 7.350–7.450)
Patient temperature: 37
pCO2 arterial: 46 mmHg (ref 32.0–48.0)
pO2, Arterial: 75 mmHg — ABNORMAL LOW (ref 83.0–108.0)

## 2016-08-09 LAB — COMPREHENSIVE METABOLIC PANEL
ALK PHOS: 117 U/L (ref 38–126)
ALT: 27 U/L (ref 17–63)
ANION GAP: 8 (ref 5–15)
AST: 30 U/L (ref 15–41)
Albumin: 2.9 g/dL — ABNORMAL LOW (ref 3.5–5.0)
BILIRUBIN TOTAL: 1 mg/dL (ref 0.3–1.2)
BUN: 14 mg/dL (ref 6–20)
CALCIUM: 8.2 mg/dL — AB (ref 8.9–10.3)
CO2: 32 mmol/L (ref 22–32)
Chloride: 98 mmol/L — ABNORMAL LOW (ref 101–111)
Creatinine, Ser: 1.03 mg/dL (ref 0.61–1.24)
Glucose, Bld: 344 mg/dL — ABNORMAL HIGH (ref 65–99)
POTASSIUM: 4.1 mmol/L (ref 3.5–5.1)
Sodium: 138 mmol/L (ref 135–145)
TOTAL PROTEIN: 7 g/dL (ref 6.5–8.1)

## 2016-08-09 LAB — MRSA PCR SCREENING: MRSA BY PCR: NEGATIVE

## 2016-08-09 LAB — PROTIME-INR
INR: 1.11
Prothrombin Time: 14.4 seconds (ref 11.4–15.2)

## 2016-08-09 LAB — APTT: aPTT: 31 seconds (ref 24–36)

## 2016-08-09 LAB — HEPARIN LEVEL (UNFRACTIONATED): Heparin Unfractionated: 0.15 IU/mL — ABNORMAL LOW (ref 0.30–0.70)

## 2016-08-09 MED ORDER — INSULIN ASPART 100 UNIT/ML ~~LOC~~ SOLN
0.0000 [IU] | Freq: Three times a day (TID) | SUBCUTANEOUS | Status: DC
  Administered 2016-08-09: 4 [IU] via SUBCUTANEOUS
  Administered 2016-08-09: 20 [IU] via SUBCUTANEOUS
  Administered 2016-08-10: 7 [IU] via SUBCUTANEOUS
  Administered 2016-08-10 (×2): 4 [IU] via SUBCUTANEOUS
  Administered 2016-08-11 – 2016-08-12 (×5): 3 [IU] via SUBCUTANEOUS
  Filled 2016-08-09 (×3): qty 1
  Filled 2016-08-09: qty 20
  Filled 2016-08-09 (×3): qty 1
  Filled 2016-08-09: qty 4

## 2016-08-09 MED ORDER — AMIODARONE LOAD VIA INFUSION
150.0000 mg | Freq: Once | INTRAVENOUS | Status: AC
  Administered 2016-08-09: 150 mg via INTRAVENOUS
  Filled 2016-08-09: qty 83.34

## 2016-08-09 MED ORDER — HYDROCOD POLST-CPM POLST ER 10-8 MG/5ML PO SUER
5.0000 mL | Freq: Two times a day (BID) | ORAL | Status: DC
Start: 2016-08-09 — End: 2016-08-12
  Administered 2016-08-09 – 2016-08-12 (×7): 5 mL via ORAL
  Filled 2016-08-09 (×6): qty 5

## 2016-08-09 MED ORDER — PIPERACILLIN SOD-TAZOBACTAM SO 2.25 (2-0.25) G IV SOLR
4.5000 g | Freq: Three times a day (TID) | INTRAVENOUS | Status: DC
  Administered 2016-08-09 – 2016-08-10 (×4): 4.5 g via INTRAVENOUS
  Filled 2016-08-09 (×5): qty 4.5

## 2016-08-09 MED ORDER — METOPROLOL TARTRATE 25 MG PO TABS: 25.0000 mg | ORAL_TABLET | Freq: Four times a day (QID) | ORAL | Status: DC | PRN

## 2016-08-09 MED ORDER — DILTIAZEM HCL 30 MG PO TABS
30.0000 mg | ORAL_TABLET | Freq: Four times a day (QID) | ORAL | Status: DC
  Filled 2016-08-09: qty 1

## 2016-08-09 MED ORDER — AMIODARONE HCL IN DEXTROSE 360-4.14 MG/200ML-% IV SOLN
30.0000 mg/h | INTRAVENOUS | Status: DC
  Administered 2016-08-10: 30 mg/h via INTRAVENOUS
  Filled 2016-08-09 (×4): qty 200

## 2016-08-09 MED ORDER — TRAMADOL HCL 50 MG PO TABS
50.0000 mg | ORAL_TABLET | Freq: Four times a day (QID) | ORAL | Status: DC | PRN
  Administered 2016-08-09 – 2016-08-10 (×2): 50 mg via ORAL
  Filled 2016-08-09 (×2): qty 1

## 2016-08-09 MED ORDER — SODIUM CHLORIDE 0.9% FLUSH
3.0000 mL | Freq: Two times a day (BID) | INTRAVENOUS | Status: DC
  Administered 2016-08-09 – 2016-08-12 (×7): 3 mL via INTRAVENOUS

## 2016-08-09 MED ORDER — AMIODARONE HCL IN DEXTROSE 360-4.14 MG/200ML-% IV SOLN
60.0000 mg/h | INTRAVENOUS | Status: AC
  Administered 2016-08-09 (×2): 60 mg/h via INTRAVENOUS
  Filled 2016-08-09 (×2): qty 200

## 2016-08-09 MED ORDER — DILTIAZEM HCL 30 MG PO TABS
30.0000 mg | ORAL_TABLET | ORAL | Status: AC
  Administered 2016-08-09: 30 mg via ORAL
  Filled 2016-08-09: qty 1

## 2016-08-09 MED ORDER — LISINOPRIL 10 MG PO TABS: 5.0000 mg | ORAL_TABLET | Freq: Every day | ORAL | Status: DC

## 2016-08-09 MED ORDER — LISINOPRIL 5 MG PO TABS
2.5000 mg | ORAL_TABLET | Freq: Every day | ORAL | Status: DC
  Filled 2016-08-09: qty 1

## 2016-08-09 MED ORDER — DILTIAZEM HCL 25 MG/5ML IV SOLN: 10.0000 mg | Freq: Once | INTRAVENOUS | Status: DC

## 2016-08-09 MED ORDER — INSULIN GLARGINE 100 UNIT/ML ~~LOC~~ SOLN
20.0000 [IU] | Freq: Two times a day (BID) | SUBCUTANEOUS | Status: DC
  Administered 2016-08-09 – 2016-08-10 (×3): 20 [IU] via SUBCUTANEOUS
  Filled 2016-08-09 (×4): qty 0.2

## 2016-08-09 MED ORDER — FUROSEMIDE 20 MG PO TABS
20.0000 mg | ORAL_TABLET | Freq: Two times a day (BID) | ORAL | Status: DC
  Administered 2016-08-09 – 2016-08-10 (×3): 20 mg via ORAL
  Filled 2016-08-09 (×2): qty 1

## 2016-08-09 MED ORDER — POTASSIUM CHLORIDE CRYS ER 20 MEQ PO TBCR
20.0000 meq | EXTENDED_RELEASE_TABLET | Freq: Two times a day (BID) | ORAL | Status: DC
  Administered 2016-08-09 – 2016-08-10 (×3): 20 meq via ORAL
  Filled 2016-08-09 (×2): qty 1

## 2016-08-09 MED ORDER — HEPARIN (PORCINE) IN NACL 100-0.45 UNIT/ML-% IJ SOLN
2000.0000 [IU]/h | INTRAMUSCULAR | Status: DC
  Administered 2016-08-09: 1600 [IU]/h via INTRAVENOUS
  Administered 2016-08-10: 2000 [IU]/h via INTRAVENOUS
  Filled 2016-08-09 (×3): qty 250

## 2016-08-09 MED ORDER — METOPROLOL TARTRATE 5 MG/5ML IV SOLN
5.0000 mg | Freq: Once | INTRAVENOUS | Status: AC
  Administered 2016-08-09: 5 mg via INTRAVENOUS
  Filled 2016-08-09: qty 5

## 2016-08-09 MED ORDER — INSULIN ASPART 100 UNIT/ML ~~LOC~~ SOLN
0.0000 [IU] | Freq: Every day | SUBCUTANEOUS | Status: DC
  Filled 2016-08-09: qty 4

## 2016-08-09 MED ORDER — HEPARIN BOLUS VIA INFUSION
4000.0000 [IU] | Freq: Once | INTRAVENOUS | Status: AC
  Administered 2016-08-09: 4000 [IU] via INTRAVENOUS
  Filled 2016-08-09: qty 4000

## 2016-08-09 MED ORDER — INSULIN ASPART 100 UNIT/ML ~~LOC~~ SOLN
5.0000 [IU] | Freq: Three times a day (TID) | SUBCUTANEOUS | Status: DC
  Administered 2016-08-09 – 2016-08-12 (×9): 5 [IU] via SUBCUTANEOUS
  Filled 2016-08-09 (×6): qty 1
  Filled 2016-08-09: qty 5
  Filled 2016-08-09: qty 1

## 2016-08-09 NOTE — Progress Notes (Signed)
ANTICOAGULATION CONSULT NOTE - Initial Consult  Pharmacy Consult for Heparin dosing and monitoring  Indication: atrial fibrillation  Allergies  Allergen Reactions  . Shellfish Allergy Hives    Patient Measurements: Height: 5\' 11"  (180.3 cm) Weight: (!) 367 lb 11.6 oz (166.8 kg) IBW/kg (Calculated) : 75.3 Heparin Dosing Weight: 115kg  Vital Signs: Temp: 97.7 F (36.5 C) (06/11 1343) Temp Source: Oral (06/11 1343) BP: 94/64 (06/11 1550) Pulse Rate: 132 (06/11 1550)  Labs:  Recent Labs  08/08/16 1431 08/09/16 0432  HGB 11.6* 10.6*  HCT 36.1* 32.5*  PLT 304 273  CREATININE 1.14 1.03    Estimated Creatinine Clearance: 132.8 mL/min (by C-G formula based on SCr of 1.03 mg/dL).   Medical History: Past Medical History:  Diagnosis Date  . CHF (congestive heart failure) ([place])   . Depression   . Diabetes mellitus without complication ([place])   . Hyperlipemia   . Hypertension     Assessment: 51 yo male with new onset A. Fib. Pharmacy consulted for heparin dosing and monitoring  Goal of Therapy:  Heparin level 0.3-0.7 units/ml Monitor platelets by anticoagulation protocol: Yes   Plan:  Baseline labs ordered Heparin dosing wt: 115kg Give 4000 units bolus x 1 Start heparin infusion at 1600 units/hr Check anti-Xa level in 6 hours and daily while on heparin Continue to monitor H&H and platelets  Pernell Dupre, PharmD, BCPS Clinical Pharmacist 08/09/2016 4:44 PM

## 2016-08-09 NOTE — Evaluation (Signed)
Physical Therapy Evaluation Patient Details Name: Russell [name] MRN: [id number] DOB: 1966/02/02 Today's Date: 08/09/2016   History of Present Illness  presented to ER secondary to L LE pain; admitted with cellulitis, abscess to L LE (status post I and D at [place] approx 1 week prior to admission per chart).  Clinical Impression    Upon evaluation, patient alert and oriented; follows all commands and demonstrates fair insight/safety awareness.  Bilat UE/LE strength and ROM grossly symmetrical (except R BKA) and WFL for basic transfers and mobility.  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  Demonstrates ability to complete basic transfers via stand/squat pivot, cga/close sup; requires bilat UE support and WBing through R knee for additional external stabilization.  Prefers to take the "long way" around with transfer for optimal integration of bilat UEs and R knee.  Does have a R LE prosthesis, but rarely utilizes for routine mobility ("only wear it when I go to a funeral or something"). Verbally reviewed principles of pressure relief, skin inspection; patient voiced understanding.  Will continue to reinforce. Will maintain on caseload to maximize safety with basic transfers and WC level needs (pressure relief, etc).  Anticipate no formal PT needs upon discharge   Follow Up Recommendations No PT follow up (will maintain on caseload throughout remaining hospitalization; anticipate no formal PT needs upon discharge)    Equipment Recommendations       Recommendations for Other Services       Precautions / Restrictions Precautions Precautions: Fall Precaution Comments: R BKA Restrictions Weight Bearing Restrictions: No      Mobility  Bed Mobility Overal bed mobility: Modified Independent                Transfers Overall transfer level: Needs assistance   Transfers: Stand Pivot Transfers   Stand pivot transfers:  Supervision       General transfer comment: pivots on L LE, usint R knee in seating surfaces (to/from) for external stabilization.  Does require bilat UE support throughout; tends to take "long way" around transfer to maintain better UE placement  Ambulation/Gait             General Gait Details: unsafe/unable; non-ambulatory at baseline  Stairs            Wheelchair Mobility    Modified Rankin (Stroke Patients Only)       Balance Overall balance assessment: Needs assistance Sitting-balance support: No upper extremity supported;Feet supported Sitting balance-Leahy Scale: Good     Standing balance support: Bilateral upper extremity supported Standing balance-Leahy Scale: Fair                               Pertinent Vitals/Pain Pain Assessment: No/denies pain    Home Living Family/patient expects to be discharged to:: Shelter/Homeless                 Additional Comments: reports he has been living "in the gym" prior to admission    Prior Function Level of Independence: Independent with assistive device(s)         Comments: Mod indep for ADLs and basic transfer (SPT without assist device); power WC as primary mobility.     Hand Dominance        Extremity/Trunk Assessment   Upper Extremity Assessment Upper Extremity Assessment: Overall WFL for tasks assessed (decreased sensation wrists distally bilat UEs)    Lower  Extremity Assessment Lower Extremity Assessment: Overall WFL for tasks assessed (decreased sensation L knee distally; L wound dressings in place; small abrasion to R knee, distal stump)       Communication   Communication: No difficulties  Cognition Arousal/Alertness: Awake/alert Behavior During Therapy: WFL for tasks assessed/performed Overall Cognitive Status: Within Functional Limits for tasks assessed                                        General Comments      Exercises Other  Exercises Other Exercises: Verbally reviewed role of skin inspection/protection with transfers, pressure relief, discussed use of hand-held mirror to fully visualize L LE.  Patient voiced understanding of all information.  Will reinforce for optimal safety and adherence   Assessment/Plan    PT Assessment Patient needs continued PT services  PT Problem List Decreased range of motion;Decreased activity tolerance;Decreased balance;Obesity;Decreased skin integrity       PT Treatment Interventions DME instruction;Functional mobility training;Therapeutic activities;Therapeutic exercise;Patient/family education    PT Goals (Current goals can be found in the Care Plan section)  Acute Rehab PT Goals Patient Stated Goal: to move around PT Goal Formulation: With patient Time For Goal Achievement: 08/23/16 Potential to Achieve Goals: Good Additional Goals Additional Goal #1: Indep demonstrate compliance with pressure relief/skin inspection for optimal skin integrity.    Frequency Min 2X/week   Barriers to discharge Decreased caregiver support      Co-evaluation               AM-PAC PT "6 Clicks" Daily Activity  Outcome Measure Difficulty turning over in bed (including adjusting bedclothes, sheets and blankets)?: None Difficulty moving from lying on back to sitting on the side of the bed? : None Difficulty sitting down on and standing up from a chair with arms (e.g., wheelchair, bedside commode, etc,.)?: None Help needed moving to and from a bed to chair (including a wheelchair)?: A Little Help needed walking in hospital room?: A Little Help needed climbing 3-5 steps with a railing? : Total 6 Click Score: 19    End of Session Equipment Utilized During Treatment: Gait belt Activity Tolerance: Patient tolerated treatment well Patient left: in bed;with call bell/phone within reach;with bed alarm set   PT Visit Diagnosis: Other abnormalities of gait and mobility (R26.89)    Time:  1510-1536 PT Time Calculation (min) (ACUTE ONLY): 26 min   Charges:   PT Evaluation $PT Eval Low Complexity: 1 Procedure PT Treatments $Therapeutic Activity: 8-22 mins   PT G Codes:        Quasim Doyon H. Owens Shark, PT, DPT, NCS 08/09/16, 4:28 PM [phone]

## 2016-08-09 NOTE — Consult Note (Signed)
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  Began as trauma and nonhealing. Chronic venous stasis disease.   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    He has a prosthesis at someone's home (he reports he is currently homeless). Encouraged to ask someone to bring his prosthesis for therapy and improved mobility.   Has worn compression in the past.  Will apply modified light compression today to mobilize excess fluid.  Wound type:Infectious Pressure Injury POA:N/A Measurement: 4 cm x 4 cm scabbed lesion.  Left leg Is erythematous and edematous from below knee to dorsal foot.  Wound LKT:GYBWLSL and dry.  Drainage (amount, consistency, odor) None Periwound:Edema, erythema Dressing procedure/placement/frequency:Cleanse left leg with soap and water.  Apply vaseline gauze to scabbed lesion to promote autolysis.  Wrap with zinc layer from below toes to below knee. Secure with self adherent Coban wrap.  Change twice weekly on Monday and Thursday.   [facility] team will follow.  Domenic Moras RN BSN [place] Pager [phone]

## 2016-08-09 NOTE — Progress Notes (Signed)
Report called to [facility] on 2c.  Patient transferred via bed.  Heparin drip had already been started.

## 2016-08-09 NOTE — Clinical Social Work Note (Addendum)
Clinical Social Work Assessment  Patient Details  Name: Russell [name] MRN: [id number] Date of Birth: 1965-09-06  Date of referral:  08/09/16               Reason for consult:  Facility Placement                Permission sought to share information with:  Facility Sport and exercise psychologist, Family Supports Permission granted to share information::  Yes, Verbal Permission Granted  Name::     Russell [name] [phone]   Agency::  SNF admissions  Relationship::     Contact Information:     Housing/Transportation Living arrangements for the past 2 months:  Homeless Source of Information:  Patient Patient Interpreter Needed:  None Criminal Activity/Legal Involvement Pertinent to Current Situation/Hospitalization:  No - Comment as needed Significant Relationships:  Spouse Lives with:  Other (Comment) (Homeless, living at gym.) Do you feel safe going back to the place where you live?  No Need for family participation in patient care:  No (Coment)  Care giving concerns:  Patient expressed concerns about trying to get his electric sooter fixed and not being able to find a handicap accessible place to live.   Social Worker assessment / plan:  Patient is a 51 year old male who is alert and oriented x4.  Patient expressed that he is currently homeless, and trying to find somewhere to live.  Patient states he has family in [place], but he does not talk with them much anymore.  Patient states he has been living at a gym and has an IT trainer wheelchair which needs some repairs.  Patient states he has been living at a gym since he arrived back from [facility] a couple of weeks ago.  Patient stated he went to [facility] to try to find a place to live, however he decided to return back to [place].  CSW talked with patient to discuss if he has spoken to DSS in regards to trying to find a place to live.  Patient said he tried calling but they were closed.  Patient stated that he was recently  just discharged from [facility] and [place].  Patient states he had his battery replaced a few months ago, but can not remember the name of where he had his battery replaced.  Patient may need SNF for wound care and IV antibiotics, CSW discussed going to SNF, or a group home, or an ALF.  Patient states he feels like he is too young to go to nursing home, however he is agreeable if he needs to go.  Patient says he was at [facility] in the past and then they discharged him.  CSW asked patient when that was and he could not remember.  Patient states he gets disability and social security monthly.  Patient says he has applied for Medicaid and is wait ing for the paperwork to go through.  CSW asked patient if the MD requires him to return back to a SNF is he willing to go.  Patient did not express any other questions or concerns.   Employment status:  Disabled (Comment on whether or not currently receiving Disability) Insurance information:  Medicare PT Recommendations:  No Follow Up Information / Referral to community resources:  [place]  Patient/Family's Response to care:  Patient is upset because he can not remember the place where his chair was fixed. Patient/Family's Understanding of and Emotional Response to Diagnosis, Current Treatment,  and Prognosis:  Patient is aware of current diagnosis and treatment plan, patient is hopeful that something will work out for him so he can find a safe place for discharge.  Emotional Assessment Appearance:  Appears stated age Attitude/Demeanor/Rapport:    Affect (typically observed):  Appropriate, Calm, Stable Orientation:  Oriented to Self, Oriented to Place, Oriented to  Time, Oriented to Situation Alcohol / Substance use:  Not Applicable Psych involvement (Current and /or in the community):  No (Comment)  Discharge Needs  Concerns to be addressed:  Discharge Planning Concerns, Homelessness Readmission within the last 30 days:   No Current discharge risk:  Homeless, Lack of support system Barriers to Discharge:  Continued Medical Work up, Homeless with medical needs   Russell [name] 08/09/2016, 4:44 PM

## 2016-08-09 NOTE — Plan of Care (Signed)
Problem: Activity: Goal: Risk for activity intolerance will decrease Outcome: Not Progressing Patient has a right BKA and uses a wheelchair to get around.

## 2016-08-09 NOTE — Progress Notes (Signed)
Dr Ether Griffins notified that cardizem IV is on back order per pharmacy.  Dr Judeen Hammans gave and order for a once cardizem po instead

## 2016-08-09 NOTE — Progress Notes (Signed)
Inpatient Diabetes Program Recommendations  AACE/ADA: New Consensus Statement on Inpatient Glycemic Control (2015)  Target Ranges:  Prepandial:   less than 140 mg/dL      Peak postprandial:   less than 180 mg/dL (1-2 hours)      Critically ill patients:  140 - 180 mg/dL   Results for ARAGORN, RECKER (MRN [id number]) as of 08/09/2016 11:24  Ref. Range 08/08/2016 14:36 08/08/2016 18:34 08/08/2016 19:22 08/08/2016 22:01 08/09/2016 07:35  Glucose-Capillary Latest Ref Range: 65 - 99 mg/dL 468 (H) 413 (H) 426 (H) 305 (H) 358 (H)   Review of Glycemic Control  Diabetes history: DM2 Outpatient Diabetes medications:Novolin Regular 40-80 units TID with meals Current orders for Inpatient glycemic control: Lantus 20 units BID, Novolog 0-9 units TID with meals  Inpatient Diabetes Program Recommendations: Insulin - Basal: Note Lantus was increased from 20 units QHS to 20 unit BID. Correction (SSI): Please consider increasing Novolog correction to moderate scale (0-15 units) and add Novolog 0-5 units QHS for bedtime correction scale. Insulin - Meal Coverage: Please consider ordering Novolog 5 units TID with meals for meal coverage if patient eats at least 50% of meals. HgbA1C: A1C in process. Diet: Added Carb Modified to Heart Healthy diet.  Thanks, Barnie Alderman, RN, MSN, CDE Diabetes Coordinator Inpatient Diabetes Program [phone] (Team Pager from 8am to 5pm)

## 2016-08-09 NOTE — Consult Note (Signed)
  Called by ward clerk for a surgery consultation for lower extremity wound  Chart reviewed which showed recent prolonged hospitalization at [facility] as recently as 5 days prior to this hospitalization with incision and drainage of an abscess while there.  Case discussed with primary team prior to patient being seen and surgery consult was canceled.  The surgery team will be available if needed. Please call again if we can be of any assistance.  Clayburn Pert, MD [facility]  Day [facility] [phone] Night ASCOM [phone]

## 2016-08-09 NOTE — Progress Notes (Signed)
[place] at [place] NAME: Russell [name]    MR#:  [id number]  DATE OF BIRTH:  04-07-1965  SUBJECTIVE:  CHIEF COMPLAINT:   Chief Complaint  Patient presents with  . pt would like placement/speak to Education officer, museum  . Hyperglycemia   The patient is a 51 year old Caucasian male with asthma by history significant for history of essential hypertension, arthritis, sleep apnea, right BKA, peripheral edema, hyperlipidemia, diabetes, insulin-dependent, lymphedema, dyspnea, morbid obesity, who presents to the hospital with complaints of progressive left lower extremity redness despite oral antibiotic therapy. In emergency room, his blood glucose level was found to be high and he was admitted. Apparently patient was admitted recently to [facility], where he's left lower extremity abscess was incised and drained, patient was discharged on linezolid and ciprofloxacin, his wound cultures did not grow nothing, per report. Upon admission to [facility], the patient was initiated on Zosyn and vancomycin, and diuretics, he feels improved, his redness is as well as swelling is improving. The patient was noted to be in A. fib, RVR, but denies any chest pain   Review of Systems  Constitutional: Negative for chills, fever and weight loss.  HENT: Negative for congestion.   Eyes: Negative for blurred vision and double vision.  Respiratory: Positive for shortness of breath. Negative for cough, sputum production and wheezing.   Cardiovascular: Positive for palpitations, leg swelling and PND. Negative for chest pain and orthopnea.  Gastrointestinal: Negative for abdominal pain, blood in stool, constipation, diarrhea, nausea and vomiting.  Genitourinary: Negative for dysuria, frequency, hematuria and urgency.  Musculoskeletal: Negative for falls.  Skin: Positive for rash.  Neurological: Negative for dizziness, tremors, focal weakness and  headaches.  Endo/Heme/Allergies: Does not bruise/bleed easily.  Psychiatric/Behavioral: Negative for depression. The patient does not have insomnia.     VITAL SIGNS: Blood pressure (!) 98/54, pulse (!) 127, temperature 97.7 F (36.5 C), temperature source Oral, resp. rate 20, height 5\' 11"  (1.803 m), weight (!) 166.8 kg (367 lb 11.6 oz), SpO2 96 %.  PHYSICAL EXAMINATION:   GENERAL:  51 y.o.-year-old morbidly obese patient lying in the bed with no acute distress.  EYES: Pupils equal, round, reactive to light and accommodation. No scleral icterus. Extraocular muscles intact.  HEENT: Head atraumatic, normocephalic. Oropharynx and nasopharynx clear.  NECK:  Supple, no jugular venous distention. No thyroid enlargement, no tenderness.  LUNGS: Markedly diminished breath sounds bilaterally, scattered wheezing, no rales,rhonchi or crepitation. No use of accessory muscles of respiration.  CARDIOVASCULAR: S1, S2 , irregularly irregular, tachycardic, difficult to assess for murmurs due to significant obesity. No murmurs, rubs, or gallops.  ABDOMEN: Soft, nontender, nondistended. Bowel sounds present. No organomegaly or mass.  EXTREMITIES: 3+ lower extremity and pedal edema,, status post right BKA, no cyanosis, or clubbing. Left lower extremity. Peripheral pulses are palpable, diminished, erythema was noted spotted areas in anterior left shin, ulceration was noted in the anterior left shin, but no significant drainage, some pustular areas, but no drainage NEUROLOGIC: Cranial nerves II through XII are intact. Muscle strength 5/5 in all extremities. Sensation intact. Gait not checked.  PSYCHIATRIC: The patient is alert and oriented x 3.  SKIN: No obvious rash, lesion, or ulcer.   ORDERS/RESULTS REVIEWED:   CBC  Recent Labs Lab 08/08/16 1431 08/09/16 0432  WBC 9.9 7.7  HGB 11.6* 10.6*  HCT 36.1* 32.5*  PLT 304 273  MCV 82.2 80.6  MCH 26.4 26.4  MCHC 32.1 32.7  RDW 16.1* 16.4*    ------------------------------------------------------------------------------------------------------------------  Chemistries   Recent Labs Lab 08/08/16 1431 08/09/16 0432  NA 134* 138  K 3.7 4.1  CL 94* 98*  CO2 29 32  GLUCOSE 496* 344*  BUN 18 14  CREATININE 1.14 1.03  CALCIUM 8.8* 8.2*  AST  --  30  ALT  --  27  ALKPHOS  --  117  BILITOT  --  1.0   ------------------------------------------------------------------------------------------------------------------ estimated creatinine clearance is 132.8 mL/min (by C-G formula based on SCr of 1.03 mg/dL). ------------------------------------------------------------------------------------------------------------------ No results for input(s): TSH, T4TOTAL, T3FREE, THYROIDAB in the last 72 hours.  Invalid input(s): FREET3  Cardiac Enzymes No results for input(s): CKMB, TROPONINI, MYOGLOBIN in the last 168 hours.  Invalid input(s): CK ------------------------------------------------------------------------------------------------------------------ Invalid input(s): POCBNP ---------------------------------------------------------------------------------------------------------------  RADIOLOGY: Dg Tibia/fibula Left  Result Date: 08/08/2016 CLINICAL DATA:  Left lower leg swelling for 2-3 days. No known injury. EXAM: LEFT TIBIA AND FIBULA - 2 VIEW COMPARISON:  None. FINDINGS: There is no acute bony or joint abnormality. Remote healed fracture of the distal diaphysis of the fibula is noted. Soft tissues of the lower leg and imaged foot are swollen. No soft tissue gas collection or radiopaque foreign body. IMPRESSION: Diffuse lower leg and foot soft tissue swelling consistent with dependent change or cellulitis. Remote healed distal fibular fracture. Electronically Signed   By: Inge Rise M.D.   On: 08/08/2016 17:49    EKG:  Orders placed or performed during the hospital encounter of 08/08/16  . ED EKG  . ED EKG  .  EKG 12-Lead  . EKG 12-Lead    ASSESSMENT AND PLAN:  Principal Problem:   Cellulitis and abscess of left leg Active Problems:   Diabetes with ulcer of leg ([place])   Hyperglycemia   Morbid obesity (H[facility])   Cellulitis and abscess of leg  #1. Cellulitis and abscess of left lower extremity, status post incision and drainage at [facility] 07/27/2016, no growth from the wound, per report, continue patient on vancomycin and Zosyn at this time, getting infectious disease specialist, Dr. Ola Spurr to see patient in consultation, blood cultures so far are negative, MRSA PCR is negative. Wound care specialist, saw patient in consultation and placed Unna boot. Discussed with the wound care, Santiago Glad and Dr. Adonis Huguenin, general surgeon #2. Diabetes mellitus, type II, insulin-dependent, advance  insulin Lantus, continue sliding scale #3. A. fib, RVR, continue patient on on Toprol-XL, initiate additional doses of metoprolol or Cardizem, depending on patient's response to intravenous doses of these medications, may need to initiate on amiodarone IV drip, awaiting for cardiology's input, echocardiogram revealed severely decreased left ventricular systolic function, ejection fraction of 22-29%, grade 2 diastolic dysfunction On echocardiogram in May 2018 at [facility], will not repeat echocardiogram, patient would benefit from Eliquis initiation #4. Acute on chronic combined systolic and diastolic CHF, continue patient on Lasix intravenously, following output, weight #5. Morbid obesity, obstructive sleep apnea, obesity hypoventilation syndrome, may benefit from auto CPAP , get nocturnal  oximeter study done #6. Lactic acidosis, off IV fluids due to significant fluid overload   Management plans discussed with the patient, family and they are in agreement.   DRUG ALLERGIES:  Allergies  Allergen Reactions  . Shellfish Allergy Hives    CODE STATUS:     Code Status Orders        Start     Ordered   08/08/16 2114   Full code  Continuous     08/08/16 2114    Code Status  History    Date Active Date Inactive Code Status Order ID Comments User Context   This patient has a current code status but no historical code status.      TOTAL TIME TAKING CARE OF THIS PATIENT: 40  minutes.    Theodoro Grist M.D on 08/09/2016 at 10:54 AM  Between 7am to 6pm - Pager - [phone]  After 6pm go to [email or web address] - password [email or web address] [facility]  Office  [phone]  CC: Primary care physician; System, Pcp Not In

## 2016-08-09 NOTE — Progress Notes (Signed)
BP was low after receiving IV metoprolol.  Heart rate remains elevated.  This was relayed to Dr Ether Griffins.  PO metoprolol to be held this am.  I will notify Dr Ether Griffins if BP comes up to systolic of 834 per her request.  She may order a dose of cardizem at that time.  Wound culture to be discontinued.

## 2016-08-09 NOTE — Consult Note (Signed)
[facility] Infectious Disease     Reason for Consult: LE cellulitis    Referring Physician:[place]ick[place], R Date of Admission:  08/08/2016   Principal Problem:   Cellulitis and abscess of left leg Active Problems:   Diabetes with ulcer of leg (HCC)   Hyperglycemia   Morbid obesity (HCC)   Cellulitis and abscess of leg   HPI: Russell [name] is a 51 y.o. male admitted with LLE cellulitis and ulcer on calf after he was admitted at [facility] for the same. He tells me at home he used string and a needle to sew together the wound which was on his leg. On admit his wbc was 9 and no fevers. Wound with 4x4 scabbed lesion with erythema and edema He has hx R BKA. Marland Kitchen  He tells me he is homeless, his motorized WC stopped working and he does not have his prosthetic leg.  Past Medical History:  Diagnosis Date  . CHF (congestive heart failure) (HCC)   . Depression   . Diabetes mellitus without complication (HCC)   . Hyperlipemia   . Hypertension    Past Surgical History:  Procedure Laterality Date  . BELOW KNEE LEG AMPUTATION     Right leg   Social History  Substance Use Topics  . Smoking status: Never Smoker  . Smokeless tobacco: Never Used  . Alcohol use No   History reviewed. No pertinent family history.  Allergies:  Allergies  Allergen Reactions  . Shellfish Allergy Hives    Current antibiotics: Antibiotics Given (last 72 hours)    Date/Time Action Medication Dose Rate   08/08/16 1910 New Bag/Given  [Administration changed to 30 minutes p-er Dr. Quale.]   piperacillin-tazobactam (ZOSYN) IVPB 3.375 g 3.375 g 12.5 mL/hr   08/09/16 0148 New Bag/Given   piperacillin-tazobactam (ZOSYN) IVPB 4.5 g 4.5 g 200 mL/hr   08/09/16 0411 New Bag/Given   vancomycin (VANCOCIN) 1,250 mg in sodium chloride 0.9 % 250 mL IVPB 1,250 mg 166.7 mL/hr   08/09/16 1148 New Bag/Given   vancomycin (VANCOCIN) 1,250 mg in sodium chloride 0.9 % 250 mL IVPB 1,250 mg 166.7 mL/hr   08/09/16 1148 New Bag/Given    piperacillin-tazobactam (ZOSYN) 4.5 g in dextrose 5 % 100 mL IVPB 4.5 g 200 mL/hr      MEDICATIONS: . aspirin EC  81 mg Oral Daily  . atorvastatin  10 mg Oral Daily  . chlorpheniramine-HYDROcodone  5 mL Oral Q12H  . diltiazem  30 mg Oral Q6H  . docusate sodium  100 mg Oral BID  . enoxaparin (LOVENOX) injection  40 mg Subcutaneous BID  . furosemide  40 mg Oral BID  . insulin aspart  0-20 Units Subcutaneous TID WC  . insulin aspart  0-5 Units Subcutaneous QHS  . insulin aspart  5 Units Subcutaneous TID WC  . insulin glargine  20 Units Subcutaneous BID  . lisinopril  5 mg Oral QHS  . metoprolol succinate  25 mg Oral Daily  . potassium chloride SA  40 mEq Oral BID  . pregabalin  100 mg Oral TID  . venlafaxine XR  150 mg Oral BID WC   Review of Systems - 11 systems reviewed and negative per HPI  OBJECTIVE: Temp:  [97.7 F (36.5 C)-98.2 F (36.8 C)] 97.7 F (36.5 C) (06/11 1343) Pulse Rate:  [79-140] 124 (06/11 1400) Resp:  [13-20] 18 (06/11 1343) BP: (98-153)/(50-91) 98/58 (06/11 1400) SpO2:  [88 %-100 %] 100 % (06/11 1343) Weight:  [166.8 kg (367 lb 11.6  oz)] 166.8 kg (367 lb 11.6 oz) (06/11 0411) Physical Exam  Constitutional: Obese HENT: anicteric Mouth/Throat: Oropharynx is clear and moist. No oropharyngeal exudate.  Cardiovascular: Normal rate, regular rhythm and normal heart sounds. Exam reveals no gallop and no friction rub.  No murmur heard.  Pulmonary/Chest: Effort normal and breath sounds normal. No respiratory distress. He has no wheezes.  Abdominal: Soft. Bowel sounds are normal. He exhibits no distension. There is no tenderness.  Lymphadenopathy: He has no cervical adenopathy.  Neurological: He is alert and oriented to person, place, and time.  Ext R BKA, L leg wrapped.  Skin: LLE with zinc wrap Psychiatric: He has a normal mood and affect. His behavior is normal.   LABS: Results for orders placed or performed during the hospital encounter of 08/08/16 (from  the past 48 hour(s))  Basic metabolic panel     Status: Abnormal   Collection Time: 08/08/16  2:31 PM  Result Value Ref Range   Sodium 134 (L) 135 - 145 mmol/L   Potassium 3.7 3.5 - 5.1 mmol/L   Chloride 94 (L) 101 - 111 mmol/L   CO2 29 22 - 32 mmol/L   Glucose, Bld 496 (H) 65 - 99 mg/dL   BUN 18 6 - 20 mg/dL   Creatinine, Ser 1.14 0.61 - 1.24 mg/dL   Calcium 8.8 (L) 8.9 - 10.3 mg/dL   GFR calc non Af Amer >60 >60 mL/min   GFR calc Af Amer >60 >60 mL/min    Comment: (NOTE) The eGFR has been calculated using the CKD EPI equation. This calculation has not been validated in all clinical situations. eGFR's persistently <60 mL/min signify possible Chronic Kidney Disease.    Anion gap 11 5 - 15  CBC     Status: Abnormal   Collection Time: 08/08/16  2:31 PM  Result Value Ref Range   WBC 9.9 3.8 - 10.6 K/uL   RBC 4.39 (L) 4.40 - 5.90 MIL/uL   Hemoglobin 11.6 (L) 13.0 - 18.0 g/dL   HCT 36.1 (L) 40.0 - 52.0 %   MCV 82.2 80.0 - 100.0 fL   MCH 26.4 26.0 - 34.0 pg   MCHC 32.1 32.0 - 36.0 g/dL   RDW 16.1 (H) 11.5 - 14.5 %   Platelets 304 150 - 440 K/uL  Glucose, capillary     Status: Abnormal   Collection Time: 08/08/16  2:36 PM  Result Value Ref Range   Glucose-Capillary 468 (H) 65 - 99 mg/dL  Lactic acid, plasma     Status: Abnormal   Collection Time: 08/08/16  6:21 PM  Result Value Ref Range   Lactic Acid, Venous 2.7 (HH) 0.5 - 1.9 mmol/L    Comment: CRITICAL RESULT CALLED TO, READ BACK BY AND VERIFIED WITH SANDRA WEAVER ON 08/08/16 AT 1906 [facility]   Blood Culture (routine x 2)     Status: None (Preliminary result)   Collection Time: 08/08/16  6:21 PM  Result Value Ref Range   Specimen Description BLOOD RIGHT ASSIST CONTROL    Special Requests      BOTTLES DRAWN AEROBIC AND ANAEROBIC Blood Culture adequate volume   Culture NO GROWTH < 12 HOURS    Report Status PENDING   Blood Culture (routine x 2)     Status: None (Preliminary result)   Collection Time: 08/08/16  6:21 PM  Result  Value Ref Range   Specimen Description BLOOD RIGHT ASSIST CONTROL    Special Requests      BOTTLES DRAWN AEROBIC AND  ANAEROBIC Blood Culture results may not be optimal due to an inadequate volume of blood received in culture bottles   Culture NO GROWTH < 12 HOURS    Report Status PENDING   Glucose, capillary     Status: Abnormal   Collection Time: 08/08/16  6:34 PM  Result Value Ref Range   Glucose-Capillary 413 (H) 65 - 99 mg/dL  Glucose, capillary     Status: Abnormal   Collection Time: 08/08/16  7:22 PM  Result Value Ref Range   Glucose-Capillary 426 (H) 65 - 99 mg/dL  Glucose, capillary     Status: Abnormal   Collection Time: 08/08/16 10:01 PM  Result Value Ref Range   Glucose-Capillary 305 (H) 65 - 99 mg/dL   Comment 1 Notify RN   Lactic acid, plasma     Status: Abnormal   Collection Time: 08/08/16 10:17 PM  Result Value Ref Range   Lactic Acid, Venous 2.5 (HH) 0.5 - 1.9 mmol/L    Comment: CRITICAL RESULT CALLED TO, READ BACK BY AND VERIFIED WITH STACEY CLAY AT 2300 ON 08/08/16 RWW   Comprehensive metabolic panel     Status: Abnormal   Collection Time: 08/09/16  4:32 AM  Result Value Ref Range   Sodium 138 135 - 145 mmol/L   Potassium 4.1 3.5 - 5.1 mmol/L   Chloride 98 (L) 101 - 111 mmol/L   CO2 32 22 - 32 mmol/L   Glucose, Bld 344 (H) 65 - 99 mg/dL   BUN 14 6 - 20 mg/dL   Creatinine, Ser 1.03 0.61 - 1.24 mg/dL   Calcium 8.2 (L) 8.9 - 10.3 mg/dL   Total Protein 7.0 6.5 - 8.1 g/dL   Albumin 2.9 (L) 3.5 - 5.0 g/dL   AST 30 15 - 41 U/L   ALT 27 17 - 63 U/L   Alkaline Phosphatase 117 38 - 126 U/L   Total Bilirubin 1.0 0.3 - 1.2 mg/dL   GFR calc non Af Amer >60 >60 mL/min   GFR calc Af Amer >60 >60 mL/min    Comment: (NOTE) The eGFR has been calculated using the CKD EPI equation. This calculation has not been validated in all clinical situations. eGFR's persistently <60 mL/min signify possible Chronic Kidney Disease.    Anion gap 8 5 - 15  CBC     Status: Abnormal    Collection Time: 08/09/16  4:32 AM  Result Value Ref Range   WBC 7.7 3.8 - 10.6 K/uL   RBC 4.03 (L) 4.40 - 5.90 MIL/uL   Hemoglobin 10.6 (L) 13.0 - 18.0 g/dL   HCT 32.5 (L) 40.0 - 52.0 %   MCV 80.6 80.0 - 100.0 fL   MCH 26.4 26.0 - 34.0 pg   MCHC 32.7 32.0 - 36.0 g/dL   RDW 16.4 (H) 11.5 - 14.5 %   Platelets 273 150 - 440 K/uL  Glucose, capillary     Status: Abnormal   Collection Time: 08/09/16  7:35 AM  Result Value Ref Range   Glucose-Capillary 358 (H) 65 - 99 mg/dL  MRSA PCR Screening     Status: None   Collection Time: 08/09/16  9:01 AM  Result Value Ref Range   MRSA by PCR NEGATIVE NEGATIVE    Comment:        The GeneXpert MRSA Assay (FDA approved for NASAL specimens only), is one component of a comprehensive MRSA colonization surveillance program. It is not intended to diagnose MRSA infection nor to guide or monitor treatment for MRSA  infections.   Glucose, capillary     Status: Abnormal   Collection Time: 08/09/16 11:58 AM  Result Value Ref Range   Glucose-Capillary 352 (H) 65 - 99 mg/dL  Urinalysis, Complete w Microscopic     Status: Abnormal   Collection Time: 08/09/16  1:43 PM  Result Value Ref Range   Color, Urine STRAW (A) YELLOW   APPearance CLEAR (A) CLEAR   Specific Gravity, Urine 1.014 1.005 - 1.030   pH 7.0 5.0 - 8.0   Glucose, UA >=500 (A) NEGATIVE mg/dL   Hgb urine dipstick NEGATIVE NEGATIVE   Bilirubin Urine NEGATIVE NEGATIVE   Ketones, ur NEGATIVE NEGATIVE mg/dL   Protein, ur NEGATIVE NEGATIVE mg/dL   Nitrite NEGATIVE NEGATIVE   Leukocytes, UA NEGATIVE NEGATIVE   RBC / HPF 0-5 0 - 5 RBC/hpf   WBC, UA 0-5 0 - 5 WBC/hpf   Bacteria, UA NONE SEEN NONE SEEN   Squamous Epithelial / LPF 0-5 (A) NONE SEEN   No components found for: ESR, C REACTIVE PROTEIN MICRO: Recent Results (from the past 720 hour(s))  Blood Culture (routine x 2)     Status: None (Preliminary result)   Collection Time: 08/08/16  6:21 PM  Result Value Ref Range Status    Specimen Description BLOOD RIGHT ASSIST CONTROL  Final   Special Requests   Final    BOTTLES DRAWN AEROBIC AND ANAEROBIC Blood Culture adequate volume   Culture NO GROWTH < 12 HOURS  Final   Report Status PENDING  Incomplete  Blood Culture (routine x 2)     Status: None (Preliminary result)   Collection Time: 08/08/16  6:21 PM  Result Value Ref Range Status   Specimen Description BLOOD RIGHT ASSIST CONTROL  Final   Special Requests   Final    BOTTLES DRAWN AEROBIC AND ANAEROBIC Blood Culture results may not be optimal due to an inadequate volume of blood received in culture bottles   Culture NO GROWTH < 12 HOURS  Final   Report Status PENDING  Incomplete  MRSA PCR Screening     Status: None   Collection Time: 08/09/16  9:01 AM  Result Value Ref Range Status   MRSA by PCR NEGATIVE NEGATIVE Final    Comment:        The GeneXpert MRSA Assay (FDA approved for NASAL specimens only), is one component of a comprehensive MRSA colonization surveillance program. It is not intended to diagnose MRSA infection nor to guide or monitor treatment for MRSA infections.     IMAGING: Dg Tibia/fibula Left  Result Date: 08/08/2016 CLINICAL DATA:  Left lower leg swelling for 2-3 days. No known injury. EXAM: LEFT TIBIA AND FIBULA - 2 VIEW COMPARISON:  None. FINDINGS: There is no acute bony or joint abnormality. Remote healed fracture of the distal diaphysis of the fibula is noted. Soft tissues of the lower leg and imaged foot are swollen. No soft tissue gas collection or radiopaque foreign body. IMPRESSION: Diffuse lower leg and foot soft tissue swelling consistent with dependent change or cellulitis. Remote healed distal fibular fracture. Electronically Signed   By: Inge Rise M.D.   On: 08/08/2016 17:49    Assessment:   Russell [name] is a 51 y.o. male with morbid obesity, poorly controlled DM (A1c 10.2), LE venous stasis and prior R BKA. now with LLE wound and cellulitis. He tried to suture  his own infected wound at home. He has no fever of wbc. XRay shows only soft tissue infection. No cultures  done of wound at [facility] or here. Wound covered. MRSA PCR negative.   Recommendations Continue zosyn and vanco Elevate leg with 4-6 pillows.  Tomorrow I will culture wound as was recently wrapped today.  Will likely be able to treat with unnawraps and oral abx.  Thank you very much for allowing me to participate in the care of this patient. Please call with questions.   Cheral Marker. Ola Spurr, MD

## 2016-08-09 NOTE — Progress Notes (Signed)
[place] rounding unit visited Pt. Pt was sitting on bed at the time of this visit. Pt appeared distressed and talked about his health struggles, his children and grandchildren, religion, personal and interpersonal relationships, and also mentioned that his wife was planing on leaving him because she can't stand blood and was tired of being his nurse. Pt was heartbroken that his wife would be leaving him soon. [place] encouraged Pt, prayed with Pt and offered ministry of empathetic listener and presence.     08/09/16 1600  Clinical Encounter Type  Visited With Patient;Family  Visit Type Initial;Other (Comment)  Referral From Chaplain  Spiritual Encounters  Spiritual Needs Prayer;Emotional;Other (Comment)

## 2016-08-10 ENCOUNTER — Other Ambulatory Visit: Payer: Medicare Other

## 2016-08-10 LAB — CBC
HEMATOCRIT: 31.7 % — AB (ref 40.0–52.0)
HEMOGLOBIN: 10.3 g/dL — AB (ref 13.0–18.0)
MCH: 26.2 pg (ref 26.0–34.0)
MCHC: 32.4 g/dL (ref 32.0–36.0)
MCV: 81 fL (ref 80.0–100.0)
Platelets: 267 10*3/uL (ref 150–440)
RBC: 3.91 MIL/uL — AB (ref 4.40–5.90)
RDW: 16.7 % — ABNORMAL HIGH (ref 11.5–14.5)
WBC: 6.3 10*3/uL (ref 3.8–10.6)

## 2016-08-10 LAB — HEMOGLOBIN A1C
HEMOGLOBIN A1C: 9.7 % — AB (ref 4.8–5.6)
Mean Plasma Glucose: 232 mg/dL

## 2016-08-10 LAB — GLUCOSE, CAPILLARY
GLUCOSE-CAPILLARY: 174 mg/dL — AB (ref 65–99)
GLUCOSE-CAPILLARY: 223 mg/dL — AB (ref 65–99)
Glucose-Capillary: 177 mg/dL — ABNORMAL HIGH (ref 65–99)
Glucose-Capillary: 145 mg/dL — ABNORMAL HIGH (ref 65–99)

## 2016-08-10 LAB — BASIC METABOLIC PANEL
Anion gap: 5 (ref 5–15)
BUN: 13 mg/dL (ref 6–20)
CHLORIDE: 99 mmol/L — AB (ref 101–111)
CO2: 33 mmol/L — AB (ref 22–32)
CREATININE: 0.9 mg/dL (ref 0.61–1.24)
Calcium: 8 mg/dL — ABNORMAL LOW (ref 8.9–10.3)
GFR calc non Af Amer: >60 mL/min (ref >60)
Glucose, Bld: 153 mg/dL — ABNORMAL HIGH (ref 65–99)
POTASSIUM: 4.1 mmol/L (ref 3.5–5.1)
Sodium: 137 mmol/L (ref 135–145)

## 2016-08-10 LAB — CREATININE, SERUM
CREATININE: 0.81 mg/dL (ref 0.61–1.24)
GFR calc non Af Amer: >60 mL/min (ref >60)

## 2016-08-10 LAB — HEPARIN LEVEL (UNFRACTIONATED): Heparin Unfractionated: 0.27 IU/mL — ABNORMAL LOW (ref 0.30–0.70)

## 2016-08-10 LAB — HIV ANTIBODY (ROUTINE TESTING W REFLEX): HIV Screen 4th Generation wRfx: NONREACTIVE

## 2016-08-10 LAB — VANCOMYCIN, TROUGH: VANCOMYCIN TR: 17 ug/mL (ref 15–20)

## 2016-08-10 MED ORDER — METOPROLOL TARTRATE 50 MG PO TABS
ORAL_TABLET | ORAL | Status: AC
  Filled 2016-08-10: qty 2

## 2016-08-10 MED ORDER — PREGABALIN 50 MG PO CAPS
ORAL_CAPSULE | ORAL | Status: AC
  Filled 2016-08-10: qty 2

## 2016-08-10 MED ORDER — METOPROLOL TARTRATE 100 MG PO TABS
100.0000 mg | ORAL_TABLET | Freq: Two times a day (BID) | ORAL | Status: DC
  Administered 2016-08-10: 100 mg via ORAL

## 2016-08-10 MED ORDER — HYDROCOD POLST-CPM POLST ER 10-8 MG/5ML PO SUER
ORAL | Status: AC
  Filled 2016-08-10: qty 5

## 2016-08-10 MED ORDER — INSULIN ASPART 100 UNIT/ML ~~LOC~~ SOLN
SUBCUTANEOUS | Status: AC
  Filled 2016-08-10: qty 1

## 2016-08-10 MED ORDER — AMIODARONE HCL IN DEXTROSE 360-4.14 MG/200ML-% IV SOLN: 30.0000 mg/h | INTRAVENOUS | Status: DC

## 2016-08-10 MED ORDER — ASPIRIN EC 81 MG PO TBEC
DELAYED_RELEASE_TABLET | ORAL | Status: AC
  Filled 2016-08-10: qty 1

## 2016-08-10 MED ORDER — HEPARIN BOLUS VIA INFUSION
3500.0000 [IU] | Freq: Once | INTRAVENOUS | Status: AC
  Administered 2016-08-10: 3500 [IU] via INTRAVENOUS
  Filled 2016-08-10: qty 3500

## 2016-08-10 MED ORDER — ORAL CARE MOUTH RINSE
15.0000 mL | Freq: Two times a day (BID) | OROMUCOSAL | Status: DC
  Administered 2016-08-10 – 2016-08-12 (×4): 15 mL via OROMUCOSAL

## 2016-08-10 MED ORDER — PIPERACILLIN-TAZOBACTAM 3.375 G IVPB
3.3750 g | Freq: Three times a day (TID) | INTRAVENOUS | Status: DC
  Administered 2016-08-10 – 2016-08-12 (×6): 3.375 g via INTRAVENOUS
  Filled 2016-08-10 (×4): qty 50

## 2016-08-10 MED ORDER — INSULIN GLARGINE 100 UNIT/ML ~~LOC~~ SOLN
25.0000 [IU] | Freq: Two times a day (BID) | SUBCUTANEOUS | Status: DC
  Administered 2016-08-10 – 2016-08-11 (×2): 25 [IU] via SUBCUTANEOUS
  Filled 2016-08-10 (×3): qty 0.25

## 2016-08-10 MED ORDER — ASPIRIN 81 MG PO CHEW
CHEWABLE_TABLET | ORAL | Status: AC
  Administered 2016-08-10: 81 mg
  Filled 2016-08-10: qty 1

## 2016-08-10 MED ORDER — DOCUSATE SODIUM 100 MG PO CAPS
ORAL_CAPSULE | ORAL | Status: AC
  Filled 2016-08-10: qty 1

## 2016-08-10 MED ORDER — HEPARIN (PORCINE) IN NACL 100-0.45 UNIT/ML-% IJ SOLN
2250.0000 [IU]/h | INTRAMUSCULAR | Status: DC
  Administered 2016-08-10: 2250 [IU]/h via INTRAVENOUS

## 2016-08-10 MED ORDER — FUROSEMIDE 20 MG PO TABS
ORAL_TABLET | ORAL | Status: AC
  Filled 2016-08-10: qty 1

## 2016-08-10 MED ORDER — ATORVASTATIN CALCIUM 10 MG PO TABS
ORAL_TABLET | ORAL | Status: AC
  Filled 2016-08-10: qty 1

## 2016-08-10 MED ORDER — APIXABAN 5 MG PO TABS
5.0000 mg | ORAL_TABLET | Freq: Two times a day (BID) | ORAL | Status: DC
  Administered 2016-08-10 – 2016-08-12 (×5): 5 mg via ORAL
  Filled 2016-08-10 (×4): qty 1

## 2016-08-10 MED ORDER — VENLAFAXINE HCL ER 75 MG PO CP24
ORAL_CAPSULE | ORAL | Status: AC
  Filled 2016-08-10: qty 2

## 2016-08-10 MED ORDER — HEPARIN BOLUS VIA INFUSION
1700.0000 [IU] | Freq: Once | INTRAVENOUS | Status: AC
  Administered 2016-08-10: 1700 [IU] via INTRAVENOUS
  Filled 2016-08-10: qty 1700

## 2016-08-10 MED ORDER — POTASSIUM CHLORIDE CRYS ER 20 MEQ PO TBCR
EXTENDED_RELEASE_TABLET | ORAL | Status: AC
  Filled 2016-08-10: qty 1

## 2016-08-10 MED ORDER — METOPROLOL SUCCINATE ER 100 MG PO TB24
ORAL_TABLET | ORAL | Status: AC
  Administered 2016-08-10: 100 mg
  Filled 2016-08-10: qty 1

## 2016-08-10 NOTE — Progress Notes (Signed)
Patient HR in the 40'S DR Ether Griffins was made aware , no new order will continue to monitor

## 2016-08-10 NOTE — Progress Notes (Signed)
Inpatient Diabetes Program Recommendations  AACE/ADA: New Consensus Statement on Inpatient Glycemic Control (2015)  Target Ranges:  Prepandial:   less than 140 mg/dL      Peak postprandial:   less than 180 mg/dL (1-2 hours)      Critically ill patients:  140 - 180 mg/dL   Lab Results  Component Value Date   GLUCAP 174 (H) 08/10/2016   HGBA1C 9.7 (H) 08/09/2016    ** reports last A1C >11% Managed by his primary care doctor in [place]- recommend he reach out to endocrinology for further assistance in managing blood sugars  Review of Glycemic Control  Results for AVIEL, DAVALOS (MRN [id number]) as of 08/10/2016 13:30  Ref. Range 08/09/2016 11:58 08/09/2016 16:52 08/09/2016 20:46 08/10/2016 07:37 08/10/2016 11:39  Glucose-Capillary Latest Ref Range: 65 - 99 mg/dL 352 (H) 181 (H) 188 (H) 174 (H) 223 (H)    Diabetes history: Type 2  Outpatient Diabetes medications: Novolin R 40-80 units tid plus Novolin R 10-15 units R at hs  Current orders for Inpatient glycemic control: Lantus 20 units bid, Novolog 5 units tid, Novolog 0-20 units tid, Novolog 5 units  Consider increasing Novolog from 5 units to 9 units tid- continue Novolog correction as ordered.   Spoke to patient at the bedside.  Tells me he has tried Lantus in the past without much success.  Is interested in U500 insulin through endocrinology.   Gentry Fitz, RN, BA, MHA, CDE Diabetes Coordinator Inpatient Diabetes Program  [phone] (Team Pager) [phone] ([place]) 08/10/2016 1:34 PM

## 2016-08-10 NOTE — Progress Notes (Signed)
ANTICOAGULATION CONSULT NOTE -follow up  Pharmacy Consult for Heparin dosing and monitoring  Indication: atrial fibrillation  Allergies  Allergen Reactions  . Shellfish Allergy Hives    Patient Measurements: Height: 5\' 11"  (180.3 cm) Weight: (!) 372 lb 9.2 oz (169 kg) IBW/kg (Calculated) : 75.3 Heparin Dosing Weight: 115kg  Vital Signs: Temp: 98 F (36.7 C) (06/12 0404) Temp Source: Oral (06/11 1949) BP: 122/82 (06/12 0404) Pulse Rate: 125 (06/12 0600)  Labs:  Recent Labs  08/08/16 1431 08/09/16 0432 08/09/16 1706 08/09/16 2322 08/10/16 0707  HGB 11.6* 10.6*  --   --  10.3*  HCT 36.1* 32.5*  --   --  31.7*  PLT 304 273  --   --  267  APTT  --   --  31  --   --   LABPROT  --   --  14.4  --   --   INR  --   --  1.11  --   --   HEPARINUNFRC  --   --   --  0.15* 0.27*  CREATININE 1.14 1.03  --   --  0.90    Estimated Creatinine Clearance: 153.2 mL/min (by C-G formula based on SCr of 0.9 mg/dL).   Medical History: Past Medical History:  Diagnosis Date  . CHF (congestive heart failure) ([place])   . Depression   . Diabetes mellitus without complication ([place])   . Hyperlipemia   . Hypertension     Assessment: 51 yo male with new onset A. Fib. Pharmacy consulted for heparin dosing and monitoring  Goal of Therapy:  Heparin level 0.3-0.7 units/ml Monitor platelets by anticoagulation protocol: Yes   Plan:  Baseline labs ordered Heparin dosing wt: 115kg Give 4000 units bolus x 1 Start heparin infusion at 1600 units/hr Check anti-Xa level in 6 hours and daily while on heparin Continue to monitor H&H and platelets   6/11 23:30 heparin level 0.15. 3500 unit bolus and increase to 2000 units/hr. Recheck in 6 hours.  6/12 Heparin level @ 0707= 0.27. Will give bolus of 1700 units x 1 and increase drip to 2250 units/hr. Recheck HL in 6 hrs.   Noralee Space, PharmD, BCPS Clinical Pharmacist 08/10/2016 7:49 AM

## 2016-08-10 NOTE — Progress Notes (Signed)
PT Cancellation Note  Patient Details Name: Russell [name] MRN: [id number] DOB: 1966/01/02   Cancelled Treatment:    Reason Eval/Treat Not Completed: Medical issues which prohibited therapy  Attempted to see pt this am.  Chart reviewed and telemetry monitor showing 130 HR at rest.  Will hold session and continue as appropriate.   Chesley Noon 08/10/2016, 9:01 AM

## 2016-08-10 NOTE — Progress Notes (Signed)
Pharmacy Antibiotic Note  Russell [name] is [name] 51 y.o. male admitted on 08/08/2016 with cellulitis.  Pharmacy has been consulted for Zosyn and vancomycin dosing.  Plan: 1. Zosyn 3.375 gm IV Q8H EI 2. Vancomycin 1.5 gm IV x 1 in ED followed in approximately 6 hours (stacked dosing) by vancomycin 1.25 gm IV Q8H. Pharmacy will continue to follow and adjust as needed to maintain trough 15 to 20 mcg/mL.  Vd 79.6 L, Ke 0.106 hr-1, T1/2 6.6 hr  6/12:  Vancomycin trough= 17 mcg/ml.  Will continue current regimen of Vancomycin 1250 mg IV q8h.  Watch for accumulation in obese patient. Monitor renal fxn. F/u for next trough level in [name] couple of days or earlier if renal fxn changes.     Height: 5\' 11"  (180.3 cm) Weight: (!) 372 lb 9.2 oz (169 kg) IBW/kg (Calculated) : 75.3  Temp (24hrs), Avg:97.8 F (36.6 C), Min:97.4 F (36.3 C), Max:98.2 F (36.8 C)   Recent Labs Lab 08/08/16 1431 08/08/16 1821 08/08/16 2217 08/09/16 0432 08/10/16 0707 08/10/16 1129  WBC 9.9  --   --  7.7 6.3  --   CREATININE 1.14  --   --  1.03 0.90 0.81  LATICACIDVEN  --  2.7* 2.5*  --   --   --   VANCOTROUGH  --   --   --   --   --  17    Estimated Creatinine Clearance: 170.2 mL/min (by C-G formula based on SCr of 0.81 mg/dL).    Allergies  Allergen Reactions  . Shellfish Allergy Hives    Thank you for allowing pharmacy to be [name] part of this patient's care.  Russell [name], Pharm.D., BCPS Clinical Pharmacist 08/10/2016 12:48 PM

## 2016-08-10 NOTE — Progress Notes (Signed)
OT Cancellation Note  Patient Details Name: Russell [name] MRN: [id number] DOB: Oct 17, 1965   Cancelled Treatment:    Reason Eval/Treat Not Completed: Medical issues which prohibited therapy. Order received, chart reviewed. RN with pt upon OT's arrival, needed to dress LLE before OT could see. HR noted to be low 40's-50's. Will hold OT evaluation this date and re-attempt tomorrow morning as appropriate.   Jeni Salles, MPH, MS, OTR/L ascom [phone] 08/10/16, 3:51 PM

## 2016-08-10 NOTE — Progress Notes (Signed)
Pharmacy Antibiotic Note  Russell [name] is a 51 y.o. male admitted on 08/08/2016 with cellulitis.  Pharmacy has been consulted for Zosyn and vancomycin dosing.  Plan: 1. Zosyn 3.375 gm IV Q8H EI 2. Vancomycin 1.5 gm IV x 1 in ED followed in approximately 6 hours (stacked dosing) by vancomycin 1.25 gm IV Q8H. Pharmacy will continue to follow and adjust as needed to maintain trough 15 to 20 mcg/mL.  Vd 79.6 L, Ke 0.106 hr-1, T1/2 6.6 hr  6/12:  Vancomycin trough= 17 mcg/ml.  Will continue current regimen of Vancomycin 1250 mg IV q8h.  Watch for accumulation in obese patient. Monitor renal fxn.     Height: 5\' 11"  (180.3 cm) Weight: (!) 372 lb 9.2 oz (169 kg) IBW/kg (Calculated) : 75.3  Temp (24hrs), Avg:97.8 F (36.6 C), Min:97.4 F (36.3 C), Max:98.2 F (36.8 C)   Recent Labs Lab 08/08/16 1431 08/08/16 1821 08/08/16 2217 08/09/16 0432 08/10/16 0707 08/10/16 1129  WBC 9.9  --   --  7.7 6.3  --   CREATININE 1.14  --   --  1.03 0.90 0.81  LATICACIDVEN  --  2.7* 2.5*  --   --   --   VANCOTROUGH  --   --   --   --   --  17    Estimated Creatinine Clearance: 170.2 mL/min (by C-G formula based on SCr of 0.81 mg/dL).    Allergies  Allergen Reactions  . Shellfish Allergy Hives    Thank you for allowing pharmacy to be a part of this patient's care.  Halo Laski A, Pharm.D., BCPS Clinical Pharmacist 08/10/2016 12:45 PM

## 2016-08-10 NOTE — Consult Note (Signed)
[facility] Consultation Note  Patient ID: Russell [name], MRN: [id number], DOB/AGE: 03-Jan-1966 51 y.o. Admit date: 08/08/2016   Date of Consult: 08/10/2016 Primary Physician: System, Pcp Not In Primary Cardiologist: None  Chief Complaint:  Chief Complaint  Patient presents with  . pt would like placement/speak to social worker  . Hyperglycemia   Reason for Consult: atrial fibrillation  HPI: 51 y.o. male with known severe diabetes with significant complication and with poor control with recently significantly high sugars having known chronic systolic dysfunction heart failure with previous ejection fraction of 30% and mild dilation with severe sleep apnea and significant new concerns of lower extremity edema status post right BKA and now severe ulcers of left lower extremity with some cellulitis. Patient was given appropriate medication management for these particular issues when he was admitted for further worsening medical concerns. The patient is been very short of breath and has had severe sleep apnea without appropriate treatment of his CPAP machine. The patient now has had worsening lower extremity edema left ulcers with pulmonary edema and now atrial fibrillation with rapid ventricular rate at 150 bpm. The patient does not had any significant chest discomfort or myocardial infarction at this time but will need medication management for further control. The patient has had an addition of heparin without any bleeding complications but currently has some anemia likely from chronic medical problems. Currently he is sleepy and has no evidence of acute distress  Past Medical History:  Diagnosis Date  . CHF (congestive heart failure) ([place])   . Depression   . Diabetes mellitus without complication ([place])   . Hyperlipemia   . Hypertension       Surgical History:  Past Surgical History:  Procedure Laterality Date  . BELOW KNEE LEG AMPUTATION     Right leg     Home  Meds: Prior to Admission medications   Medication Sig Start Date End Date Taking? Authorizing Provider  aspirin EC 81 MG tablet Take 81 mg by mouth daily.   Yes [provider]  atorvastatin (LIPITOR) 10 MG tablet Take 10 mg by mouth daily.   Yes [provider]  furosemide (LASIX) 40 MG tablet Take 40 mg by mouth 2 (two) times daily.    Yes [provider]  insulin regular (NOVOLIN R,HUMULIN R) 100 units/mL injection Inject 40-80 Units into the skin 3 (three) times daily before meals. Depending on blood sugar   Yes [provider]  lidocaine (XYLOCAINE) 5 % ointment Apply 1 application topically as needed for mild pain.   Yes [provider]  lisinopril (PRINIVIL,ZESTRIL) 10 MG tablet Take 10 mg by mouth daily.   Yes [provider]  metolazone (ZAROXOLYN) 2.5 MG tablet Take 2.5 mg by mouth daily as needed (prior to Lasix if increased weight).   Yes [provider]  metoprolol succinate (TOPROL-XL) 25 MG 24 hr tablet Take 25 mg by mouth daily.   Yes [provider]  nitroGLYCERIN (NITROSTAT) 0.4 MG SL tablet Place 0.4 mg under the tongue every 5 (five) minutes as needed for chest pain.   Yes [provider]  potassium chloride SA (K-DUR,KLOR-CON) 20 MEQ tablet Take 40 mEq by mouth 2 (two) times daily.    Yes [provider]  pregabalin (LYRICA) 100 MG capsule Take 100 mg by mouth 3 (three) times daily.   Yes [provider]  torsemide (DEMADEX) 100 MG tablet Take 100 mg by mouth daily as needed (for edema).  Yes [provider]  busPIRone (BUSPAR) 7.5 MG tablet Take 7.5 mg by mouth 3 (three) times daily.    [provider]  citalopram (CELEXA) 20 MG tablet Take 20 mg by mouth daily.    [provider]  ibuprofen (ADVIL,MOTRIN) 600 MG tablet Take 1 tablet (600 mg total) by mouth every 8 (eight) hours as needed. Patient not taking: Reported on 08/08/2016 02/21/15   Nance Pear, MD  venlafaxine XR (EFFEXOR-XR) 150 MG 24 hr capsule Take 150 mg by mouth 2 (two) times daily.    [provider]    Inpatient Medications:  . aspirin EC  81 mg Oral Daily  . atorvastatin  10 mg Oral Daily  . chlorpheniramine-HYDROcodone  5 mL Oral Q12H  . docusate sodium  100 mg Oral BID  . furosemide  20 mg Oral BID  . heparin  1,700 Units Intravenous Once  . insulin aspart  0-20 Units Subcutaneous TID WC  . insulin aspart  0-5 Units Subcutaneous QHS  . insulin aspart  5 Units Subcutaneous TID WC  . insulin glargine  20 Units Subcutaneous BID  . mouth rinse  15 mL Mouth Rinse BID  . metoprolol tartrate  100 mg Oral BID  . potassium chloride SA  20 mEq Oral BID  . pregabalin  100 mg Oral TID  . sodium chloride flush  3 mL Intravenous Q12H  . venlafaxine XR  150 mg Oral BID WC   . amiodarone 30 mg/hr (08/10/16 0010)  . heparin    . piperacillin-tazobactam (ZOSYN)  IV Stopped (08/10/16 0533)  . vancomycin Stopped (08/10/16 0502)  . vancomycin      Allergies:  Allergies  Allergen Reactions  . Shellfish Allergy Hives    Social History   Social History  . Marital status: Married    Spouse name: N/A  . Number of children: N/A  . Years of education: N/A   Occupational History  . Not on file.   Social History Main Topics  . Smoking status: Never Smoker  . Smokeless tobacco: Never Used  . Alcohol use No  . Drug use: No  . Sexual activity: Not on file   Other Topics Concern  . Not on file   Social History Narrative  . No narrative on file     History reviewed. No pertinent family history.   Review of Systems Positive for Shortness of breath palpitations swelling of the legs Negative for: General:  chills, fever, night sweats or weight changes.  Cardiovascular: PND orthopnea syncope dizziness  Dermatological skin lesions rashes Respiratory: Cough congestion Urologic: Frequent urination urination at night and hematuria Abdominal: negative  for nausea, vomiting, diarrhea, bright red blood per rectum, melena, or hematemesis Neurologic: negative for visual changes, and/or hearing changes  All other systems reviewed and are otherwise negative except as noted above.  Labs: No results for input(s): CKTOTAL, CKMB, TROPONINI in the last 72 hours. Lab Results  Component Value Date   WBC 6.3 08/10/2016   HGB 10.3 (L) 08/10/2016   HCT 31.7 (L) 08/10/2016   MCV 81.0 08/10/2016   PLT 267 08/10/2016    Recent Labs Lab 08/09/16 0432 08/10/16 0707  NA 138 137  K 4.1 4.1  CL 98* 99*  CO2 32 33*  BUN 14 13  CREATININE 1.03 0.90  CALCIUM 8.2* 8.0*  PROT 7.0  --   BILITOT 1.0  --   ALKPHOS 117  --   ALT 27  --   AST 30  --  GLUCOSE 344* 153*   No results found for: CHOL, HDL, LDLCALC, TRIG No results found for: DDIMER  Radiology/Studies:  Dg Chest 2 View  Result Date: 08/09/2016 CLINICAL DATA:  Tachycardia.  Elevated blood sugar. EXAM: CHEST  2 VIEW COMPARISON:  09/12/2015. FINDINGS: The heart size and mediastinal contours are within normal limits. Both lungs are clear. Slight vascular congestion. The visualized skeletal structures are unremarkable. IMPRESSION: Mild vascular congestion. Otherwise, No active cardiopulmonary disease. Similar appearance to priors. Electronically Signed   By: Staci Righter M.D.   On: 08/09/2016 16:39   Dg Tibia/fibula Left  Result Date: 08/08/2016 CLINICAL DATA:  Left lower leg swelling for 2-3 days. No known injury. EXAM: LEFT TIBIA AND FIBULA - 2 VIEW COMPARISON:  None. FINDINGS: There is no acute bony or joint abnormality. Remote healed fracture of the distal diaphysis of the fibula is noted. Soft tissues of the lower leg and imaged foot are swollen. No soft tissue gas collection or radiopaque foreign body. IMPRESSION: Diffuse lower leg and foot soft tissue swelling consistent with dependent change or cellulitis. Remote healed distal fibular fracture. Electronically Signed   By: Inge Rise  M.D.   On: 08/08/2016 17:49    EKG: Atrial fibrillation with rapid ventricular rate and nonspecific ST and T-wave changes  Weights: Filed Weights   08/08/16 1430 08/09/16 0411 08/10/16 0404  Weight: (!) 171.5 kg (378 lb) (!) 166.8 kg (367 lb 11.6 oz) (!) 169 kg (372 lb 9.2 oz)     Physical Exam: Blood pressure 122/82, pulse (!) 125, temperature 98 F (36.7 C), resp. rate 17, height 5\' 11"  (1.803 m), weight (!) 169 kg (372 lb 9.2 oz), SpO2 98 %. Body mass index is 51.96 kg/m. General: Well developed, well nourished, in no acute distress. Head eyes ears nose throat: Normocephalic, atraumatic, sclera non-icteric, no xanthomas, nares are without discharge. No apparent thyromegaly and/or mass  Lungs: Normal respiratory effort.  Diffuse wheezes, no rales, no rhonchi.  Heart: Irregular with normal S1 S2. no murmur gallop, no rub, PMI is normal size and placement, carotid upstroke normal without bruit, jugular venous pressure is normal Abdomen: Soft, non-tender,  distended with normoactive bowel sounds. No hepatomegaly. No rebound/guarding. No obvious abdominal masses. Abdominal aorta is normal size without bruit Extremities: No edema. no cyanosis, no clubbing, no ulcers  Peripheral : 2+ bilateral upper extremity pulses,1 + bilateral femoral pulses, 0 + left dorsal pedal pulse right BKA Neuro: Alert and oriented. No facial asymmetry. No focal deficit. Moves all extremities spontaneously. Musculoskeletal: Normal muscle tone without kyphosis Psych:  Responds to questions appropriately with a normal affect.    Assessment: 51 year old male with acute on chronic systolic dysfunction congestive heart failure secondary to atrial fibrillation with rapid ventricular rate nonvalvular in nature with diabetes with complication and exacerbation with high sugars severe sleep apnea likely causing above issues and now with lower extremity edema and ulcers needing medical management without current evidence of  myocardial infarction and/or angina  Plan: 1. Continue amiodarone drip for 24 more hours and changed to oral amiodarone 400 mg twice per day 2. Addition of metoprolol 100 mg twice per day for heart rate control of 60-90 bpm in addition to above 3. Anticoagulation with heparin for further risk reduction in stroke with atrial fibrillation and possible change to oral anticoagulation thereafter 4. Continue high intensity cholesterol therapy for coronary artery disease risk reduction with diabetes 5. Diuresis as able with diuretics for further risk reduction of lower extremity edema and cellulitis  and pulmonary edema asked 6. No further cardiac diagnostics necessary at this time 7. Begin ambulation and follow for adjustments of medications  Signed, Corey Skains M.D. [facility] 08/10/2016, 8:06 AM

## 2016-08-10 NOTE — Progress Notes (Addendum)
[facility] INFECTIOUS DISEASE PROGRESS NOTE Date of Admission:  08/08/2016     ID: Russell [name] is a 51 y.o. male with LLE wound, cellulitis Principal Problem:   Cellulitis and abscess of left leg Active Problems:   Diabetes with ulcer of leg ([place])   Hyperglycemia   Morbid obesity ([facility])   Cellulitis and abscess of leg   Subjective: No fevers, says leg less swollen.   ROS  Eleven systems are reviewed and negative except per hpi  Medications:  Antibiotics Given (last 72 hours)    Date/Time Action Medication Dose Rate   08/08/16 1910 New Bag/Given  [Administration changed to 30 minutes [name]-er Dr. Quale.]   piperacillin-tazobactam (ZOSYN) IVPB 3.375 g 3.375 g 12.5 mL/hr   08/09/16 0148 New Bag/Given   piperacillin-tazobactam (ZOSYN) IVPB 4.5 g 4.5 g 200 mL/hr   08/09/16 0411 New Bag/Given   vancomycin (VANCOCIN) 1,250 mg in sodium chloride 0.9 % 250 mL IVPB 1,250 mg 166.7 mL/hr   08/09/16 1148 New Bag/Given   vancomycin (VANCOCIN) 1,250 mg in sodium chloride 0.9 % 250 mL IVPB 1,250 mg 166.7 mL/hr   08/09/16 1148 New Bag/Given   piperacillin-tazobactam (ZOSYN) 4.5 g in dextrose 5 % 100 mL IVPB 4.5 g 200 mL/hr   08/09/16 2105 New Bag/Given   vancomycin (VANCOCIN) 1,250 mg in sodium chloride 0.9 % 250 mL IVPB 1,250 mg 166.7 mL/hr   08/09/16 2105 New Bag/Given   piperacillin-tazobactam (ZOSYN) 4.5 g in dextrose 5 % 100 mL IVPB 4.5 g 200 mL/hr   08/10/16 0300 New Bag/Given   vancomycin (VANCOCIN) 1,250 mg in sodium chloride 0.9 % 250 mL IVPB 1,250 mg 166.7 mL/hr   08/10/16 0503 New Bag/Given   piperacillin-tazobactam (ZOSYN) 4.5 g in dextrose 5 % 100 mL IVPB 4.5 g 200 mL/hr   08/10/16 1159 New Bag/Given   piperacillin-tazobactam (ZOSYN) 4.5 g in dextrose 5 % 100 mL IVPB 4.5 g 200 mL/hr   08/10/16 1251 New Bag/Given   vancomycin (VANCOCIN) 1,250 mg in sodium chloride 0.9 % 250 mL IVPB 1,250 mg 166.7 mL/hr     . apixaban  5 mg Oral BID  . aspirin EC      . aspirin EC  81 mg  Oral Daily  . atorvastatin      . atorvastatin  10 mg Oral Daily  . chlorpheniramine-HYDROcodone  5 mL Oral Q12H  . chlorpheniramine-HYDROcodone      . docusate sodium      . docusate sodium  100 mg Oral BID  . furosemide      . furosemide  20 mg Oral BID  . insulin aspart      . insulin aspart      . insulin aspart  0-20 Units Subcutaneous TID WC  . insulin aspart  0-5 Units Subcutaneous QHS  . insulin aspart  5 Units Subcutaneous TID WC  . insulin glargine  25 Units Subcutaneous BID  . mouth rinse  15 mL Mouth Rinse BID  . metoprolol tartrate      . potassium chloride SA      . potassium chloride SA  20 mEq Oral BID  . pregabalin      . pregabalin  100 mg Oral TID  . sodium chloride flush  3 mL Intravenous Q12H  . venlafaxine XR  150 mg Oral BID WC  . venlafaxine XR        Objective: Vital signs in last 24 hours: Temp:  [97.4 F (36.3 C)-98.2 F (36.8 C)]  97.7 F (36.5 C) (06/12 1123) Pulse Rate:  [124-134] 124 (06/12 1123) Resp:  [16-18] 16 (06/12 1123) BP: (94-122)/(44-82) 95/63 (06/12 1123) SpO2:  [97 %-100 %] 97 % (06/12 1123) Weight:  [169 kg (372 lb 9.2 oz)] 169 kg (372 lb 9.2 oz) (06/12 0404) Constitutional: Obese HENT: anicteric Mouth/Throat: Oropharynx is clear and moist. No oropharyngeal exudate.  Cardiovascular: Normal rate, regular rhythm and normal heart sounds. Exam reveals no gallop and no friction rub.  No murmur heard.  Pulmonary/Chest: Effort normal and breath sounds normal. No respiratory distress. He has no wheezes.  Abdominal: Soft. Bowel sounds are normal. He exhibits no distension. There is no tenderness.  Lymphadenopathy: He has no cervical adenopathy.  Neurological: He is alert and oriented to person, place, and time.  Ext R BKA, L leg narked lymphedema, ant with bright red erythema, he has a necrotic appearing wound on medial shin, with home made suture, draining pus, probes about 2-3 cm deep, has undermingng Skin: LLE with zinc  wrap Psychiatric: He has a normal mood and affect. His behavior is normal.   Lab Results  Recent Labs  08/09/16 0432 08/10/16 0707 08/10/16 1129  WBC 7.7 6.3  --   HGB 10.6* 10.3*  --   HCT 32.5* 31.7*  --   NA 138 137  --   K 4.1 4.1  --   CL 98* 99*  --   CO2 32 33*  --   BUN 14 13  --   CREATININE 1.03 0.90 0.81    Microbiology: Results for orders placed or performed during the hospital encounter of 08/08/16  Blood Culture (routine x 2)     Status: None (Preliminary result)   Collection Time: 08/08/16  6:21 PM  Result Value Ref Range Status   Specimen Description BLOOD RIGHT ASSIST CONTROL  Final   Special Requests   Final    BOTTLES DRAWN AEROBIC AND ANAEROBIC Blood Culture adequate volume   Culture NO GROWTH 2 DAYS  Final   Report Status PENDING  Incomplete  Blood Culture (routine x 2)     Status: None (Preliminary result)   Collection Time: 08/08/16  6:21 PM  Result Value Ref Range Status   Specimen Description BLOOD RIGHT ASSIST CONTROL  Final   Special Requests   Final    BOTTLES DRAWN AEROBIC AND ANAEROBIC Blood Culture results may not be optimal due to an inadequate volume of blood received in culture bottles   Culture NO GROWTH 2 DAYS  Final   Report Status PENDING  Incomplete  MRSA PCR Screening     Status: None   Collection Time: 08/09/16  9:01 AM  Result Value Ref Range Status   MRSA by PCR NEGATIVE NEGATIVE Final    Comment:        The GeneXpert MRSA Assay (FDA approved for NASAL specimens only), is one component of a comprehensive MRSA colonization surveillance program. It is not intended to diagnose MRSA infection nor to guide or monitor treatment for MRSA infections.     Studies/Results: Dg Chest 2 View  Result Date: 08/09/2016 CLINICAL DATA:  Tachycardia.  Elevated blood sugar. EXAM: CHEST  2 VIEW COMPARISON:  09/12/2015. FINDINGS: The heart size and mediastinal contours are within normal limits. Both lungs are clear. Slight vascular  congestion. The visualized skeletal structures are unremarkable. IMPRESSION: Mild vascular congestion. Otherwise, No active cardiopulmonary disease. Similar appearance to priors. Electronically Signed   By: Staci Righter M.D.   On: 08/09/2016 16:39  Dg Tibia/fibula Left  Result Date: 08/08/2016 CLINICAL DATA:  Left lower leg swelling for 2-3 days. No known injury. EXAM: LEFT TIBIA AND FIBULA - 2 VIEW COMPARISON:  None. FINDINGS: There is no acute bony or joint abnormality. Remote healed fracture of the distal diaphysis of the fibula is noted. Soft tissues of the lower leg and imaged foot are swollen. No soft tissue gas collection or radiopaque foreign body. IMPRESSION: Diffuse lower leg and foot soft tissue swelling consistent with dependent change or cellulitis. Remote healed distal fibular fracture. Electronically Signed   By: Inge Rise M.D.   On: 08/08/2016 17:49    Assessment/Plan: OZ GAMMEL is a 51 y.o. male with morbid obesity, poorly controlled DM (A1c 10.2), LE venous stasis and prior R BKA. now with LLE wound and cellulitis. He tried to suture his own infected wound at home and currently refuses to have the sutures removed.   He has no fever or wbc. XRay shows only soft tissue infection. No cultures done of wound at [facility] or here. Wound is quite necrotic and would benefit from debridement but pt refusing.Marland Kitchen MRSA PCR negative.   Recommendations Continue zosyn and vanco I have cultured wound today Elevate leg with 4-6 pillows.  He is adamantly refusing removal of his homemade sutures and debridement. I advised him leaving the sutures in will lead to further infection and very likely loss of the leg but he still refuses. GIven the extent of the wound and cellulitis and limb threatening infection he would benefit from PICC placement and IV abx.  Thank you very much for the consult. Will follow with you.  Russell [name]   08/10/2016, 3:15 PM

## 2016-08-10 NOTE — Progress Notes (Signed)
ANTICOAGULATION CONSULT NOTE - Initial Consult  Pharmacy Consult for Apixaban Indication: atrial fibrillation  Allergies  Allergen Reactions  . Shellfish Allergy Hives    Patient Measurements: Height: 5\' 11"  (180.3 cm) Weight: (!) 372 lb 9.2 oz (169 kg) IBW/kg (Calculated) : 75.3 Heparin Dosing Weight:   Vital Signs: Temp: 97.7 F (36.5 C) (06/12 1123) Temp Source: Oral (06/12 1123) BP: 95/63 (06/12 1123) Pulse Rate: 124 (06/12 1123)  Labs:  Recent Labs  08/08/16 1431 08/09/16 0432 08/09/16 1706 08/09/16 2322 08/10/16 0707  HGB 11.6* 10.6*  --   --  10.3*  HCT 36.1* 32.5*  --   --  31.7*  PLT 304 273  --   --  267  APTT  --   --  31  --   --   LABPROT  --   --  14.4  --   --   INR  --   --  1.11  --   --   HEPARINUNFRC  --   --   --  0.15* 0.27*  CREATININE 1.14 1.03  --   --  0.90    Estimated Creatinine Clearance: 153.2 mL/min (by C-G formula based on SCr of 0.9 mg/dL).   Medical History: Past Medical History:  Diagnosis Date  . CHF (congestive heart failure) ([name])   . Depression   . Diabetes mellitus without complication ([name])   . Hyperlipemia   . Hypertension     Medications:  Scheduled:  . apixaban  5 mg Oral BID  . aspirin EC      . aspirin EC  81 mg Oral Daily  . atorvastatin      . atorvastatin  10 mg Oral Daily  . chlorpheniramine-HYDROcodone  5 mL Oral Q12H  . chlorpheniramine-HYDROcodone      . docusate sodium      . docusate sodium  100 mg Oral BID  . furosemide      . furosemide  20 mg Oral BID  . insulin aspart      . insulin aspart      . insulin aspart  0-20 Units Subcutaneous TID WC  . insulin aspart  0-5 Units Subcutaneous QHS  . insulin aspart  5 Units Subcutaneous TID WC  . insulin glargine  20 Units Subcutaneous BID  . mouth rinse  15 mL Mouth Rinse BID  . metoprolol tartrate      . metoprolol tartrate  100 mg Oral BID  . potassium chloride SA      . potassium chloride SA  20 mEq Oral BID  . pregabalin      .  pregabalin  100 mg Oral TID  . sodium chloride flush  3 mL Intravenous Q12H  . venlafaxine XR  150 mg Oral BID WC  . venlafaxine XR        Assessment: 51 yo male with new onset A. Fib. Transitioning from Heparin drip to Apixaban  Goal of Therapy:  Monitor platelets by anticoagulation protocol: Yes   Plan:  Begin Apixaban 5 mg po bid. Discontinue Heparin drip when give apixaban.   Russell [name] A 08/10/2016,11:57 AM

## 2016-08-10 NOTE — Plan of Care (Signed)
Problem: Physical Regulation: Goal: Ability to maintain clinical measurements within normal limits will improve Outcome: Not Progressing Amiodarone drip continues.  HR remains >120 with soft SBP.

## 2016-08-10 NOTE — Progress Notes (Signed)
Dr Ara Kussmaul notified of SB 50s and SBP.  Order given to hold Amiodarone infusion.

## 2016-08-10 NOTE — NC FL2 (Signed)
[place] LEVEL OF CARE SCREENING TOOL     IDENTIFICATION  Patient Name: Russell [name] Birthdate: 12/08/1965 Sex: male Admission Date (Current Location): 08/08/2016  [place] and [place] Number:  Engineering geologist and Address:  [facility], [address] [place], [place] [address]      Provider Number: [id number]  Attending Physician Name and Address:  Theodoro Grist, MD  Relative Name and Phone Number:  Michel, Hendon [phone]     Current Level of Care: Hospital Recommended Level of Care: [place] Prior Approval Number:    Date Approved/Denied:   PASRR Number: Pending  Discharge Plan: SNF    Current Diagnoses: Patient Active Problem List   Diagnosis Date Noted  . Cellulitis and abscess of left leg 08/08/2016  . Diabetes with ulcer of leg ([place]) 08/08/2016  . Hyperglycemia 08/08/2016  . Morbid obesity ([place]) 08/08/2016  . Cellulitis and abscess of leg 08/08/2016  . Adjustment disorder with depressed mood 11/21/2014  . Other social stressor 11/21/2014  . 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 Diabetes ([place]) 11/21/2014    Orientation RESPIRATION BLADDER Height & Weight     Self, Time, Situation, Place    Continent Weight: (!) 372 lb 9.2 oz (169 kg) Height:  5\' 11"  (180.3 cm)  BEHAVIORAL SYMPTOMS/MOOD NEUROLOGICAL BOWEL NUTRITION STATUS      Continent Diet (2g sodium diet)  AMBULATORY STATUS COMMUNICATION OF NEEDS Skin   Supervision Verbally Surgical wounds                       Personal Care Assistance Level of Assistance  Bathing, Feeding Bathing Assistance: Limited assistance Feeding assistance: Independent       Functional Limitations Info  Hearing, Speech, Sight Sight Info: Impaired Hearing Info: Adequate Speech Info: Adequate    SPECIAL CARE FACTORS FREQUENCY                       Contractures Contractures Info: Not present    Additional Factors Info  Code  Status, Allergies, Psychotropic, Insulin Sliding Scale Code Status Info: Full Code Allergies Info: Shellfish Psychotropic Info: venlafaxine XR (EFFEXOR-XR) 24 hr capsule 150 mg Insulin Sliding Scale Info: insulin aspart (novoLOG) injection 0-20 Units 3x a day with meals, insulin aspart (novoLOG) injection 5 Units 3x a day with meals,        Current Medications (08/10/2016):  This is the current hospital active medication list Current Facility-Administered Medications  Medication Dose Route Frequency Provider Last Rate Last Dose  . acetaminophen (TYLENOL) tablet 650 mg  650 mg Oral Q6H PRN Idelle Crouch, MD       Or  . acetaminophen (TYLENOL) suppository 650 mg  650 mg Rectal Q6H PRN Idelle Crouch, MD      . amiodarone (NEXTERONE PREMIX) 360-4.14 MG/200ML-% (1.8 mg/mL) IV infusion  30 mg/hr Intravenous Continuous Theodoro Grist, MD      . apixaban (ELIQUIS) tablet 5 mg  5 mg Oral BID Theodoro Grist, MD   5 mg at 08/10/16 1208  . aspirin EC 81 MG tablet           . aspirin EC tablet 81 mg  81 mg Oral Daily Idelle Crouch, MD   81 mg at 08/10/16 0840  . atorvastatin (LIPITOR) tablet 10 mg  10 mg Oral Daily Idelle Crouch, MD   10 mg at 08/10/16 0820  . bisacodyl (DULCOLAX) suppository 10 mg  10 mg Rectal Daily PRN Idelle Crouch, MD      . chlorpheniramine-HYDROcodone (TUSSIONEX) 10-8 MG/5ML suspension 5 mL  5 mL Oral Q12H Theodoro Grist, MD   5 mL at 08/10/16 0821  . docusate sodium (COLACE) 100 MG capsule           . docusate sodium (COLACE) capsule 100 mg  100 mg Oral BID Idelle Crouch, MD   100 mg at 08/10/16 0831  . furosemide (LASIX) tablet 20 mg  20 mg Oral BID Theodoro Grist, MD   20 mg at 08/10/16 1704  . insulin aspart (novoLOG) injection 0-20 Units  0-20 Units Subcutaneous TID WC Theodoro Grist, MD   4 Units at 08/10/16 1649  . insulin aspart (novoLOG) injection 0-5 Units  0-5 Units Subcutaneous QHS Emmelia Holdsworth, MD      . insulin aspart (novoLOG) injection 5  Units  5 Units Subcutaneous TID WC Theodoro Grist, MD   5 Units at 08/10/16 1650  . insulin glargine (LANTUS) injection 25 Units  25 Units Subcutaneous BID Theodoro Grist, MD      . MEDLINE mouth rinse  15 mL Mouth Rinse BID Theodoro Grist, MD   15 mL at 08/10/16 0838  . metoprolol tartrate (LOPRESSOR) 50 MG tablet           . morphine 2 MG/ML injection 2 mg  2 mg Intravenous Q2H PRN Idelle Crouch, MD   2 mg at 08/08/16 2237  . nitroGLYCERIN (NITROSTAT) SL tablet 0.4 mg  0.4 mg Sublingual Q5 min PRN Idelle Crouch, MD      . ondansetron [facility]) tablet 4 mg  4 mg Oral Q6H PRN Idelle Crouch, MD       Or  . ondansetron (ZOFRAN) injection 4 mg  4 mg Intravenous Q6H PRN Idelle Crouch, MD      . piperacillin-tazobactam (ZOSYN) IVPB 3.375 g  3.375 g Intravenous Q8H Fulton Reek D, MD      . potassium chloride SA (K-DUR,KLOR-CON) CR tablet 20 mEq  20 mEq Oral BID Theodoro Grist, MD   20 mEq at 08/10/16 0820  . pregabalin (LYRICA) capsule 100 mg  100 mg Oral TID Idelle Crouch, MD   100 mg at 08/10/16 1544  . sodium chloride flush (NS) 0.9 % injection 3 mL  3 mL Intravenous Q12H Theodoro Grist, MD   3 mL at 08/10/16 8127  . traMADol (ULTRAM) tablet 50 mg  50 mg Oral Q6H PRN Theodoro Grist, MD   50 mg at 08/09/16 2104  . vancomycin (VANCOCIN) 1,250 mg in sodium chloride 0.9 % 250 mL IVPB  1,250 mg Intravenous Q8H Idelle Crouch, MD   Stopped at 08/10/16 1421  . venlafaxine XR (EFFEXOR-XR) 24 hr capsule 150 mg  150 mg Oral BID WC Idelle Crouch, MD   150 mg at 08/10/16 1650     Discharge Medications: Please see discharge summary for a list of discharge medications.  Relevant Imaging Results:  Relevant Lab Results:   Additional Information SSN [id number]   Patient has a prosthesis, and also has an IT trainer wheelchair.  Patient weighs 372 lbs.  [place], Jones Broom, LCSWA

## 2016-08-10 NOTE — Progress Notes (Signed)
ANTICOAGULATION CONSULT NOTE - Initial Consult  Pharmacy Consult for Heparin dosing and monitoring  Indication: atrial fibrillation  Allergies  Allergen Reactions  . Shellfish Allergy Hives    Patient Measurements: Height: 5\' 11"  (180.3 cm) Weight: (!) 367 lb 11.6 oz (166.8 kg) IBW/kg (Calculated) : 75.3 Heparin Dosing Weight: 115kg  Vital Signs: Temp: 97.8 F (36.6 C) (06/11 1949) Temp Source: Oral (06/11 1949) BP: 98/72 (06/11 1949) Pulse Rate: 132 (06/11 1949)  Labs:  Recent Labs  08/08/16 1431 08/09/16 0432 08/09/16 1706 08/09/16 2322  HGB 11.6* 10.6*  --   --   HCT 36.1* 32.5*  --   --   PLT 304 273  --   --   APTT  --   --  31  --   LABPROT  --   --  14.4  --   INR  --   --  1.11  --   HEPARINUNFRC  --   --   --  0.15*  CREATININE 1.14 1.03  --   --     Estimated Creatinine Clearance: 132.8 mL/min (by C-G formula based on SCr of 1.03 mg/dL).   Medical History: Past Medical History:  Diagnosis Date  . CHF (congestive heart failure) ([place])   . Depression   . Diabetes mellitus without complication ([place])   . Hyperlipemia   . Hypertension     Assessment: 51 yo male with new onset A. Fib. Pharmacy consulted for heparin dosing and monitoring  Goal of Therapy:  Heparin level 0.3-0.7 units/ml Monitor platelets by anticoagulation protocol: Yes   Plan:  Baseline labs ordered Heparin dosing wt: 115kg Give 4000 units bolus x 1 Start heparin infusion at 1600 units/hr Check anti-Xa level in 6 hours and daily while on heparin Continue to monitor H&H and platelets   6/11 23:30 heparin level 0.15. 3500 unit bolus and increase to 2000 units/hr. Recheck in 6 hours.  Eloise Harman, PharmD, BCPS Clinical Pharmacist 08/10/2016 12:37 AM

## 2016-08-10 NOTE — Progress Notes (Addendum)
PT Cancellation Note  Patient Details Name: Russell [name] MRN: [id number] DOB: [birth year]   Cancelled Treatment:    Reason Eval/Treat Not Completed: Patient declined, no reason specified   Returned for therapy session.  HR down to low 120's at rest.  Pt declined session at this time.  Brother has brought in a knee walker for pt to use.  Will discuss with primary PT as he does not have current ambulation goals.     Chesley Noon 08/10/2016, 12:01 PM

## 2016-08-10 NOTE — Progress Notes (Signed)
[place] [facility] [place] NAME: Russell [name]    MR#:  [id number]  DATE OF BIRTH:  1965-03-06  SUBJECTIVE:  CHIEF COMPLAINT:   Chief Complaint  Patient presents with  . pt would like placement/speak to Education officer, museum  . Hyperglycemia   The patient is a 51 year old Caucasian male with asthma by history significant for history of essential hypertension, arthritis, sleep apnea, right BKA, peripheral edema, hyperlipidemia, diabetes, insulin-dependent, lymphedema, dyspnea, morbid obesity, who presents to the hospital with complaints of progressive left lower extremity redness despite oral antibiotic therapy. In emergency room, his blood glucose level was found to be high and he was admitted. Apparently patient was admitted recently to [facility], where he's left lower extremity abscess was incised and drained, patient was discharged on linezolid and ciprofloxacin, his wound cultures did not grow nothing, per report. Upon admission to [facility], the patient was initiated on Zosyn and vancomycin, and diuretics, he feels improved, his redness is as well as swelling is improving. The patient was noted to be in A. fib, RVR, but denied any chest pain, He was initiated on amiodarone drip and metoprolol, converted to sinus rhythm now. The patient was seen by Dr. Ola Spurr, who recommended to culture the wound, however, since Unna boots were applied yesterday, no cultures were performed. Patient remains afebrile. White blood cell count has improved. Blood cultures are negative so far. MRSA PCR is negative.   Review of Systems  Constitutional: Negative for chills, fever and weight loss.  HENT: Negative for congestion.   Eyes: Negative for blurred vision and double vision.  Respiratory: Positive for shortness of breath. Negative for cough, sputum production and wheezing.   Cardiovascular: Positive for palpitations, leg swelling and PND.  Negative for chest pain and orthopnea.  Gastrointestinal: Negative for abdominal pain, blood in stool, constipation, diarrhea, nausea and vomiting.  Genitourinary: Negative for dysuria, frequency, hematuria and urgency.  Musculoskeletal: Negative for falls.  Skin: Positive for rash.  Neurological: Negative for dizziness, tremors, focal weakness and headaches.  Endo/Heme/Allergies: Does not bruise/bleed easily.  Psychiatric/Behavioral: Negative for depression. The patient does not have insomnia.     VITAL SIGNS: Blood pressure 95/63, pulse (!) 124, temperature 97.7 F (36.5 C), temperature source Oral, resp. rate 16, height 5\' 11"  (1.803 m), weight (!) 169 kg (372 lb 9.2 oz), SpO2 97 %.  PHYSICAL EXAMINATION:   GENERAL:  51 y.o.-year-old morbidly obese patient lying in the bed with no acute distress.  EYES: Pupils equal, round, reactive to light and accommodation. No scleral icterus. Extraocular muscles intact.  HEENT: Head atraumatic, normocephalic. Oropharynx and nasopharynx clear.  NECK:  Supple, no jugular venous distention. No thyroid enlargement, no tenderness.  LUNGS: Markedly diminished breath sounds bilaterally, scattered wheezing, no rales,rhonchi or crepitation. No use of accessory muscles of respiration.  CARDIOVASCULAR: S1, S2 , irregularly irregular, tachycardic, difficult to assess for murmurs due to significant obesity. No murmurs, rubs, or gallops.  ABDOMEN: Soft, nontender, nondistended. Bowel sounds present. No organomegaly or mass.  EXTREMITIES: Bilateral lower extremity and pedal edema,, status post right BKA, no cyanosis, or clubbing. Left lower extremity Unna boot NEUROLOGIC: Cranial nerves II through XII are intact. Muscle strength 5/5 in all extremities. Sensation intact. Gait not checked.  PSYCHIATRIC: The patient is alert and oriented x 3.  SKIN: No obvious rash, lesion, or ulcer.   ORDERS/RESULTS REVIEWED:   CBC  Recent Labs Lab 08/08/16 1431 08/09/16 0432  08/10/16 0962  WBC 9.9 7.7 6.3  HGB 11.6* 10.6* 10.3*  HCT 36.1* 32.5* 31.7*  PLT 304 273 267  MCV 82.2 80.6 81.0  MCH 26.4 26.4 26.2  MCHC 32.1 32.7 32.4  RDW 16.1* 16.4* 16.7*   ------------------------------------------------------------------------------------------------------------------  Chemistries   Recent Labs Lab 08/08/16 1431 08/09/16 0432 08/10/16 0707 08/10/16 1129  NA 134* 138 137  --   K 3.7 4.1 4.1  --   CL 94* 98* 99*  --   CO2 29 32 33*  --   GLUCOSE 496* 344* 153*  --   BUN 18 14 13   --   CREATININE 1.14 1.03 0.90 0.81  CALCIUM 8.8* 8.2* 8.0*  --   AST  --  30  --   --   ALT  --  27  --   --   ALKPHOS  --  117  --   --   BILITOT  --  1.0  --   --    ------------------------------------------------------------------------------------------------------------------ estimated creatinine clearance is 170.2 mL/min (by C-G formula based on SCr of 0.81 mg/dL). ------------------------------------------------------------------------------------------------------------------ No results for input(s): TSH, T4TOTAL, T3FREE, THYROIDAB in the last 72 hours.  Invalid input(s): FREET3  Cardiac Enzymes No results for input(s): CKMB, TROPONINI, MYOGLOBIN in the last 168 hours.  Invalid input(s): CK ------------------------------------------------------------------------------------------------------------------ Invalid input(s): POCBNP ---------------------------------------------------------------------------------------------------------------  RADIOLOGY: Dg Chest 2 View  Result Date: 08/09/2016 CLINICAL DATA:  Tachycardia.  Elevated blood sugar. EXAM: CHEST  2 VIEW COMPARISON:  09/12/2015. FINDINGS: The heart size and mediastinal contours are within normal limits. Both lungs are clear. Slight vascular congestion. The visualized skeletal structures are unremarkable. IMPRESSION: Mild vascular congestion. Otherwise, No active cardiopulmonary disease. Similar  appearance to priors. Electronically Signed   By: Staci Righter M.D.   On: 08/09/2016 16:39   Dg Tibia/fibula Left  Result Date: 08/08/2016 CLINICAL DATA:  Left lower leg swelling for 2-3 days. No known injury. EXAM: LEFT TIBIA AND FIBULA - 2 VIEW COMPARISON:  None. FINDINGS: There is no acute bony or joint abnormality. Remote healed fracture of the distal diaphysis of the fibula is noted. Soft tissues of the lower leg and imaged foot are swollen. No soft tissue gas collection or radiopaque foreign body. IMPRESSION: Diffuse lower leg and foot soft tissue swelling consistent with dependent change or cellulitis. Remote healed distal fibular fracture. Electronically Signed   By: Inge Rise M.D.   On: 08/08/2016 17:49    EKG:  Orders placed or performed during the hospital encounter of 08/08/16  . ED EKG  . ED EKG  . EKG 12-Lead  . EKG 12-Lead  . EKG 12-Lead  . EKG 12-Lead    ASSESSMENT AND PLAN:  Principal Problem:   Cellulitis and abscess of left leg Active Problems:   Diabetes with ulcer of leg ([place])   Hyperglycemia   Morbid obesity ([facility])   Cellulitis and abscess of leg  #1. Cellulitis and abscess of left lower extremity, status post incision and drainage at [facility] 07/27/2016, no growth from the wound, per report, continue patient on vancomycin and Zosyn at this time, awaiting for infectious disease specialist Dr. Ola Spurr recommendations, blood cultures so far are negative, MRSA PCR is negative. Wound care specialist, saw patient in consultation and placed Unna boot. Discussed with the wound care, Santiago Glad and Dr. Adonis Huguenin, general surgeon yesterday. Patient denies any pains at present. Wound culture are not taken due to Unna boots as of yet.  #2. Diabetes mellitus, type II, insulin-dependent, advance  insulin Lantus to 25  units twice a day, continue sliding scale #3. A. fib, RVR, converted to sinus rhythm on amiodarone IV drip and Toprol-XL, discontinue Toprol-XL, as per  discussion with Dr. Nehemiah Massed, continue amiodarone IV drip loading , echocardiogram revealed severely decreased left ventricular systolic function, ejection fraction of 53-00%, grade 2 diastolic dysfunction as per echocardiogram in May 2018 at [facility], will not repeat echocardiogram, initiated on Eliquis , discontinue heparin.  #4. Acute on chronic combined systolic and diastolic CHF, continue patient on Lasix intravenously, following output, weight, no accurate urine output measurements #5. Morbid obesity, obstructive sleep apnea, obesity hypoventilation syndrome, initiate auto CPAP , he needs sleep study as outpatient #6. Lactic acidosis, off IV fluids due to significant fluid overload, recheck level tomorrow #7. Generalized weakness, patient may benefit from skilled nursing facility placement versus group home, discussed with care management today , depending on physical therapist recommendations, pending  Management plans discussed with the patient, family and they are in agreement.   DRUG ALLERGIES:  Allergies  Allergen Reactions  . Shellfish Allergy Hives    CODE STATUS:     Code Status Orders        Start     Ordered   08/08/16 2114  Full code  Continuous     08/08/16 2114    Code Status History    Date Active Date Inactive Code Status Order ID Comments User Context   This patient has a current code status but no historical code status.      TOTAL TIME TAKING CARE OF THIS PATIENT: 35 minutes.    Theodoro Grist M.D on 08/10/2016 at 2:19 PM  Between 7am to 6pm - Pager - [phone]  After 6pm go to [email or web address] - password [email or web address] [place] [facility]  Office  [phone]  CC: Primary care physician; System, Pcp Not In

## 2016-08-10 NOTE — Care Management (Signed)
Patient says his wheelchair and belongings are at [employer].  Discussed having his wife assist him with finding name of business that provided him with a new wheelchair battery and CSW nor SM can provide assist with locating the name of the business.  Verbalized understanding.

## 2016-08-11 LAB — BASIC METABOLIC PANEL
ANION GAP: 8 (ref 5–15)
ANION GAP: 8 (ref 5–15)
BUN: 22 mg/dL — ABNORMAL HIGH (ref 6–20)
BUN: 22 mg/dL — ABNORMAL HIGH (ref 6–20)
CALCIUM: 8.2 mg/dL — AB (ref 8.9–10.3)
CHLORIDE: 98 mmol/L — AB (ref 101–111)
CHLORIDE: 98 mmol/L — AB (ref 101–111)
CO2: 28 mmol/L (ref 22–32)
CO2: 28 mmol/L (ref 22–32)
Calcium: 8.1 mg/dL — ABNORMAL LOW (ref 8.9–10.3)
Creatinine, Ser: 1.49 mg/dL — ABNORMAL HIGH (ref 0.61–1.24)
Creatinine, Ser: 1.69 mg/dL — ABNORMAL HIGH (ref 0.61–1.24)
GFR calc Af Amer: >60 mL/min (ref >60)
GFR calc Af Amer: 52 mL/min — ABNORMAL LOW (ref >60)
GFR calc non Af Amer: 45 mL/min — ABNORMAL LOW (ref >60)
GFR, EST NON AFRICAN AMERICAN: 52 mL/min — AB (ref >60)
GLUCOSE: 150 mg/dL — AB (ref 65–99)
Glucose, Bld: 145 mg/dL — ABNORMAL HIGH (ref 65–99)
POTASSIUM: 5.3 mmol/L — AB (ref 3.5–5.1)
POTASSIUM: 4.6 mmol/L (ref 3.5–5.1)
SODIUM: 134 mmol/L — AB (ref 135–145)
Sodium: 134 mmol/L — ABNORMAL LOW (ref 135–145)

## 2016-08-11 LAB — GLUCOSE, CAPILLARY
GLUCOSE-CAPILLARY: 130 mg/dL — AB (ref 65–99)
GLUCOSE-CAPILLARY: 139 mg/dL — AB (ref 65–99)
GLUCOSE-CAPILLARY: 91 mg/dL (ref 65–99)
Glucose-Capillary: 138 mg/dL — ABNORMAL HIGH (ref 65–99)

## 2016-08-11 LAB — CBC
HCT: 32.7 % — ABNORMAL LOW (ref 40.0–52.0)
HEMOGLOBIN: 10.6 g/dL — AB (ref 13.0–18.0)
MCH: 27 pg (ref 26.0–34.0)
MCHC: 32.5 g/dL (ref 32.0–36.0)
MCV: 83.2 fL (ref 80.0–100.0)
PLATELETS: 260 10*3/uL (ref 150–440)
RBC: 3.93 MIL/uL — AB (ref 4.40–5.90)
RDW: 17 % — ABNORMAL HIGH (ref 11.5–14.5)
WBC: 7.6 10*3/uL (ref 3.8–10.6)

## 2016-08-11 LAB — VANCOMYCIN, TROUGH: Vancomycin Tr: 37 ug/mL (ref 15–20)

## 2016-08-11 MED ORDER — SODIUM POLYSTYRENE SULFONATE 15 GM/60ML PO SUSP
15.0000 g | Freq: Once | ORAL | Status: AC
  Administered 2016-08-11: 15 g via ORAL
  Filled 2016-08-11: qty 60

## 2016-08-11 MED ORDER — SODIUM CHLORIDE 0.9% FLUSH: 10.0000 mL | INTRAVENOUS | Status: DC | PRN

## 2016-08-11 MED ORDER — SODIUM CHLORIDE 0.9 % IV SOLN
INTRAVENOUS | Status: DC
  Administered 2016-08-11: 08:00:00 via INTRAVENOUS

## 2016-08-11 MED ORDER — AMIODARONE HCL 200 MG PO TABS
400.0000 mg | ORAL_TABLET | Freq: Every day | ORAL | Status: DC
  Administered 2016-08-11 – 2016-08-12 (×2): 400 mg via ORAL
  Filled 2016-08-11 (×2): qty 2

## 2016-08-11 MED ORDER — INSULIN GLARGINE 100 UNIT/ML ~~LOC~~ SOLN
28.0000 [IU] | Freq: Two times a day (BID) | SUBCUTANEOUS | Status: DC
  Administered 2016-08-12: 28 [IU] via SUBCUTANEOUS
  Filled 2016-08-11 (×4): qty 0.28

## 2016-08-11 MED ORDER — SODIUM CHLORIDE 0.9% FLUSH
10.0000 mL | Freq: Two times a day (BID) | INTRAVENOUS | Status: DC
  Administered 2016-08-11 – 2016-08-12 (×3): 10 mL

## 2016-08-11 MED ORDER — FINASTERIDE 5 MG PO TABS
5.0000 mg | ORAL_TABLET | Freq: Every day | ORAL | Status: DC
  Administered 2016-08-11 – 2016-08-12 (×2): 5 mg via ORAL
  Filled 2016-08-11 (×2): qty 1

## 2016-08-11 MED ORDER — TAMSULOSIN HCL 0.4 MG PO CAPS
0.4000 mg | ORAL_CAPSULE | Freq: Every day | ORAL | Status: DC
  Administered 2016-08-11 – 2016-08-12 (×2): 0.4 mg via ORAL
  Filled 2016-08-11 (×2): qty 1

## 2016-08-11 NOTE — Care Management (Signed)
Patient's ex wife brought contact information regarding patient's power wheelchair. Informed by  [employer]:  If patient requires a new wheelchair, his PCP must initiate the referral.  Patient's PCP is Dr Kym Groom. Referrals can not be initiated if patient is going to skilled nursing- but can  proceed if discharges home or assisted living.  It is anticipated patient will discharge to skilled nursing facility  and will need long term IV antibiotics.

## 2016-08-11 NOTE — Clinical Social Work Note (Signed)
CSW received Passar number for patient, passar number is [phone] F.  CSW continuing to follow patient's progress throughout discharge planning.  Jones Broom. [place], MSW, [place]  08/11/2016 4:10 PM

## 2016-08-11 NOTE — Progress Notes (Addendum)
Russell [name] at [place] NAME: Russell [name]    MR#:  [id number]  DATE OF BIRTH:  1966/02/09  SUBJECTIVE:  CHIEF COMPLAINT:   Chief Complaint  Patient presents with  . pt would like placement/speak to Education officer, museum  . Hyperglycemia   The patient is a 51 year old Caucasian male with asthma by history significant for history of essential hypertension, arthritis, sleep apnea, right BKA, peripheral edema, hyperlipidemia, diabetes, insulin-dependent, lymphedema, dyspnea, morbid obesity, who presents to the hospital with complaints of progressive left lower extremity redness despite oral antibiotic therapy. In emergency room, his blood glucose level was found to be high and he was admitted. Apparently patient was admitted recently to [facility], where he's left lower extremity abscess was incised and drained, patient was discharged on linezolid and ciprofloxacin, his wound cultures did not grow nothing, per report. Upon admission to [facility], the patient was initiated on Zosyn and vancomycin, and diuretics, he feels improved, his redness is as well as swelling is improving. The patient was noted to be in A. fib, RVR, but denied any chest pain, He was initiated on amiodarone drip and metoprolol, converted to sinus rhythm now. The patient was seen by Dr. Ola Spurr, who recommended to culture the wound, however, since Unna boots were applied yesterday, no cultures were performed. Patient remains afebrile. White blood cell count has improved. Blood cultures are negative so far. MRSA PCR is negative.Patient was seen by infectious disease specialist, Dr. Ola Spurr, who recommended to undo homemade to sutures, but patient refused. PICC line placement and intravenous antibiotics were recommended Labs today revealed elevation of creatinine and potassium, patient was given IV fluids for 3 hours at lower rate, however, a his kidney function  did not improve . Potassium was treated with Kayexalate with improvement of levels    Review of Systems  Constitutional: Negative for chills, fever and weight loss.  HENT: Negative for congestion.   Eyes: Negative for blurred vision and double vision.  Respiratory: Positive for shortness of breath. Negative for cough, sputum production and wheezing.   Cardiovascular: Positive for palpitations, leg swelling and PND. Negative for chest pain and orthopnea.  Gastrointestinal: Negative for abdominal pain, blood in stool, constipation, diarrhea, nausea and vomiting.  Genitourinary: Negative for dysuria, frequency, hematuria and urgency.  Musculoskeletal: Negative for falls.  Skin: Positive for rash.  Neurological: Negative for dizziness, tremors, focal weakness and headaches.  Endo/Heme/Allergies: Does not bruise/bleed easily.  Psychiatric/Behavioral: Negative for depression. The patient does not have insomnia.     VITAL SIGNS: Blood pressure 122/73, pulse (!) 56, temperature 98 F (36.7 C), temperature source Oral, resp. rate 20, height 5\' 11"  (1.803 m), weight (!) 169.9 kg (374 lb 9 oz), SpO2 94 %.  PHYSICAL EXAMINATION:   GENERAL:  51 y.o.-year-old morbidly obese patient lying in the bed with no acute distress.  EYES: Pupils equal, round, reactive to light and accommodation. No scleral icterus. Extraocular muscles intact.  HEENT: Head atraumatic, normocephalic. Oropharynx and nasopharynx clear.  NECK:  Supple, no jugular venous distention. No thyroid enlargement, no tenderness.  LUNGS: Markedly diminished breath sounds bilaterally, scattered wheezing, no rales,rhonchi or crepitation. No use of accessory muscles of respiration.  CARDIOVASCULAR: S1, S2 , irregularly irregular, tachycardic, difficult to assess for murmurs due to significant obesity. No murmurs, rubs, or gallops.  ABDOMEN: Soft, nontender, nondistended. Bowel sounds present. No organomegaly or mass.  EXTREMITIES: Bilateral  lower extremity and pedal edema,, status post right  BKA, no cyanosis, or clubbing. Left lower extremity in Unna boot NEUROLOGIC: Cranial nerves II through XII are intact. Muscle strength 5/5 in all extremities. Sensation intact. Gait not checked.  PSYCHIATRIC: The patient is alert and oriented x 3.  SKIN: No obvious rash, lesion, or ulcer.   ORDERS/RESULTS REVIEWED:   CBC  Recent Labs Lab 08/08/16 1431 08/09/16 0432 08/10/16 0707 08/11/16 0430  WBC 9.9 7.7 6.3 7.6  HGB 11.6* 10.6* 10.3* 10.6*  HCT 36.1* 32.5* 31.7* 32.7*  PLT 304 273 267 260  MCV 82.2 80.6 81.0 83.2  MCH 26.4 26.4 26.2 27.0  MCHC 32.1 32.7 32.4 32.5  RDW 16.1* 16.4* 16.7* 17.0*   ------------------------------------------------------------------------------------------------------------------  Chemistries   Recent Labs Lab 08/08/16 1431 08/09/16 0432 08/10/16 0707 08/10/16 1129 08/11/16 0430 08/11/16 1122  NA 134* 138 137  --  134* 134*  K 3.7 4.1 4.1  --  5.3* 4.6  CL 94* 98* 99*  --  98* 98*  CO2 29 32 33*  --  28 28  GLUCOSE 496* 344* 153*  --  145* 150*  BUN 18 14 13   --  22* 22*  CREATININE 1.14 1.03 0.90 0.81 1.49* 1.69*  CALCIUM 8.8* 8.2* 8.0*  --  8.1* 8.2*  AST  --  30  --   --   --   --   ALT  --  27  --   --   --   --   ALKPHOS  --  117  --   --   --   --   BILITOT  --  1.0  --   --   --   --    ------------------------------------------------------------------------------------------------------------------ estimated creatinine clearance is 81.8 mL/min (A) (by C-G formula based on SCr of 1.69 mg/dL (H)). ------------------------------------------------------------------------------------------------------------------ No results for input(s): TSH, T4TOTAL, T3FREE, THYROIDAB in the last 72 hours.  Invalid input(s): FREET3  Cardiac Enzymes No results for input(s): CKMB, TROPONINI, MYOGLOBIN in the last 168 hours.  Invalid input(s):  CK ------------------------------------------------------------------------------------------------------------------ Invalid input(s): POCBNP ---------------------------------------------------------------------------------------------------------------  RADIOLOGY: Dg Chest 2 View  Result Date: 08/09/2016 CLINICAL DATA:  Tachycardia.  Elevated blood sugar. EXAM: CHEST  2 VIEW COMPARISON:  09/12/2015. FINDINGS: The heart size and mediastinal contours are within normal limits. Both lungs are clear. Slight vascular congestion. The visualized skeletal structures are unremarkable. IMPRESSION: Mild vascular congestion. Otherwise, No active cardiopulmonary disease. Similar appearance to priors. Electronically Signed   By: Staci Righter M.D.   On: 08/09/2016 16:39    EKG:  Orders placed or performed during the hospital encounter of 08/08/16  . ED EKG  . ED EKG  . EKG 12-Lead  . EKG 12-Lead  . EKG 12-Lead  . EKG 12-Lead    ASSESSMENT AND PLAN:  Principal Problem:   Cellulitis and abscess of left leg Active Problems:   Diabetes with ulcer of leg ([place])   Hyperglycemia   Morbid obesity (HCC)   Cellulitis and abscess of leg  #1. Cellulitis and abscess of left lower extremity, status post incision and drainage at [facility] 07/27/2016, not cultured at [facility], according to Dr. Jacquiline Doe, off vancomycin continue Zosyn , place PICC line, appreciate Dr. Ola Spurr recommendations, blood cultures so far are negative, MRSA PCR is negative. Wound care specialist, saw patient in consultation and placed Unna boot.  Wound culture is pending, patient refuses to and do homemade sutures, will need intravenous antibiotic therapy via PICC line, per ID #2. Diabetes mellitus, type II, insulin-dependent, advance  insulin Lantus to  28  units twice a day, continue sliding scale #3. A. fib, RVR, converted to sinus rhythm on amiodarone IV drip and Toprol-XL,  Now offToprol-XL, continue amiodarone orally,  appreciate cardiology's input, echocardiogram revealed severely decreased left ventricular systolic function, ejection fraction of 40-37%, grade 2 diastolic dysfunction as per echocardiogram in May 2018 at [facility], will not repeat echocardiogram,continue Eliquis   #4. Acute on chronic combined systolic and diastolic CHF, hold Lasix for today due to acute renal failure, reassess creatinine the morning. Can resume Lasix tomorrow, following output, weight, no accurate urine output measurements #5. Morbid obesity, obstructive sleep apnea, obesity hypoventilation syndrome, auto CPAP was not initiated last night, he needs sleep study as outpatient #6. Lactic acidosis, off IV fluids due to significant fluid overload #7. Generalized weakness, patient would benefit from skilled nursing facility placement , discussed with care management before, awaiting for physical therapist recommendations, pending #8 acute urinary retention, bladder scan above 600 ml, place Foley to drain urine, initiate finasteride, Flomax, dc Foley for voiding trial in about 1 week, follow creatinine   Management plans discussed with the patient, family and they are in agreement.   DRUG ALLERGIES:  Allergies  Allergen Reactions  . Shellfish Allergy Hives    CODE STATUS:     Code Status Orders        Start     Ordered   08/08/16 2114  Full code  Continuous     08/08/16 2114    Code Status History    Date Active Date Inactive Code Status Order ID Comments User Context   This patient has a current code status but no historical code status.      TOTAL TIME TAKING CARE OF THIS PATIENT: 35 minutes.    Theodoro Grist M.D on 08/11/2016 at 1:24 PM  Between 7am to 6pm - Pager - [phone]  After 6pm go to [email or web address] - password [email or web address] [facility]  Office  [phone]  CC: Primary care physician; System, Pcp Not In

## 2016-08-11 NOTE — Progress Notes (Signed)
Pharmacy Antibiotic Note  Russell [name] is a 51 y.o. male admitted on 08/08/2016 with cellulitis.  Pharmacy has been consulted for Zosyn and vancomycin dosing.  Plan: 1. Zosyn 3.375 gm IV Q8H EI 2. Vancomycin 1.5 gm IV x 1 in ED followed in approximately 6 hours (stacked dosing) by vancomycin 1.25 gm IV Q8H. Pharmacy will continue to follow and adjust as needed to maintain trough 15 to 20 mcg/mL.  Vd 79.6 L, Ke 0.106 hr-1, T1/2 6.6 hr  6/12:  Vancomycin trough= 17 mcg/ml.  Will continue current regimen of Vancomycin 1250 mg IV q8h.  Watch for accumulation in obese patient. Monitor renal fxn. F/u for next trough level in a couple of days or earlier if renal fxn changes.  6/13: Scr increase from 0.81 to 1.49.  Patient was on lasix 20mg  bid, Zosyn and had hypoperfusion? (appears to have drop in BP (74/37) last night). Will check Vancomycin level today at 1130. Patient may have accumulated. Will F/u. 6/13 continued: Vancomycin trough @ 1122 = 37 mcg/ml.  Will Hold (discontinue) Vancomycin order.   Will recheck Vanc level today at 2300 (~18 hrs after last dose, which was given 6/13 @ 0341).  Plan to redose when Vancomycin level < 20 mcg/ml)     Height: 5\' 11"  (180.3 cm) Weight: (!) 374 lb 9 oz (169.9 kg) IBW/kg (Calculated) : 75.3  Temp (24hrs), Avg:98.1 F (36.7 C), Min:97.7 F (36.5 C), Max:98.5 F (36.9 C)   Recent Labs Lab 08/08/16 1431 08/08/16 1821 08/08/16 2217 08/09/16 0432 08/10/16 0707 08/10/16 1129 08/11/16 0430 08/11/16 1122  WBC 9.9  --   --  7.7 6.3  --  7.6  --   CREATININE 1.14  --   --  1.03 0.90 0.81 1.49* 1.69*  LATICACIDVEN  --  2.7* 2.5*  --   --   --   --   --   VANCOTROUGH  --   --   --   --   --  17  --  37*    Estimated Creatinine Clearance: 81.8 mL/min (A) (by C-G formula based on SCr of 1.69 mg/dL (H)).    Allergies  Allergen Reactions  . Shellfish Allergy Hives    Thank you for allowing pharmacy to be a part of this patient's  care.  Lillie Bollig A, Pharm.D., BCPS Clinical Pharmacist 08/11/2016 12:13 PM

## 2016-08-11 NOTE — Progress Notes (Signed)
Pharmacy Antibiotic Note  BRYDON SPAHR is a 51 y.o. male admitted on 08/08/2016 with cellulitis.  Pharmacy has been consulted for Zosyn and vancomycin dosing.  Plan: 1. Zosyn 3.375 gm IV Q8H EI 2. Vancomycin 1.5 gm IV x 1 in ED followed in approximately 6 hours (stacked dosing) by vancomycin 1.25 gm IV Q8H. Pharmacy will continue to follow and adjust as needed to maintain trough 15 to 20 mcg/mL.  Vd 79.6 L, Ke 0.106 hr-1, T1/2 6.6 hr  6/12:  Vancomycin trough= 17 mcg/ml.  Will continue current regimen of Vancomycin 1250 mg IV q8h.  Watch for accumulation in obese patient. Monitor renal fxn. F/u for next trough level in a couple of days or earlier if renal fxn changes.  6/13: Scr increase from 0.81 to 1.49.  Patient was on lasix 20mg  bid, Zosyn and hypoperfusion? (appears to have drop in BP (74/37) last night). Will check Vancomycin level today at 1130. Patient may have accumulated. Will F/u.      Height: 5\' 11"  (180.3 cm) Weight: (!) 374 lb 9 oz (169.9 kg) IBW/kg (Calculated) : 75.3  Temp (24hrs), Avg:98 F (36.7 C), Min:97.7 F (36.5 C), Max:98.5 F (36.9 C)   Recent Labs Lab 08/08/16 1431 08/08/16 1821 08/08/16 2217 08/09/16 0432 08/10/16 0707 08/10/16 1129 08/11/16 0430  WBC 9.9  --   --  7.7 6.3  --  7.6  CREATININE 1.14  --   --  1.03 0.90 0.81 1.49*  LATICACIDVEN  --  2.7* 2.5*  --   --   --   --   VANCOTROUGH  --   --   --   --   --  17  --     Estimated Creatinine Clearance: 92.8 mL/min (A) (by C-G formula based on SCr of 1.49 mg/dL (H)).    Allergies  Allergen Reactions  . Shellfish Allergy Hives    Thank you for allowing pharmacy to be a part of this patient's care.  Loudon Krakow A, Pharm.D., BCPS Clinical Pharmacist 08/11/2016 9:16 AM

## 2016-08-11 NOTE — Progress Notes (Signed)
Peripherally Inserted Central Catheter/Midline Placement  The IV Nurse has discussed with the patient and/or persons authorized to consent for the patient, the purpose of this procedure and the potential benefits and risks involved with this procedure.  The benefits include less needle sticks, lab draws from the catheter, and the patient may be discharged home with the catheter. Risks include, but not limited to, infection, bleeding, blood clot (thrombus formation), and puncture of an artery; nerve damage and irregular heartbeat and possibility to perform a PICC exchange if needed/ordered by physician.  Alternatives to this procedure were also discussed.  Bard Power PICC patient education guide, fact sheet on infection prevention and patient information card has been provided to patient /or left at bedside.    PICC/Midline Placement Documentation        Henrique Parekh, Nicolette Bang 08/11/2016, 2:59 PM

## 2016-08-11 NOTE — Progress Notes (Signed)
OT Cancellation Note  Patient Details Name: Russell [name] MRN: [id number] DOB: 05/28/1965   Cancelled Treatment:    Reason Eval/Treat Not Completed: Patient at procedure or test/ unavailable. Pt undergoing sterile procedure in the room. Will re-attempt OT evaluation at later date/time as pt is available.  Jeni Salles, MPH, MS, OTR/L ascom [phone] 08/11/16, 2:11 PM

## 2016-08-11 NOTE — Progress Notes (Signed)
[facility] Encounter Note  Patient: Russell [name] / Admit Date: 08/08/2016 / Date of Encounter: 08/11/2016, 8:45 AM   Subjective: Patient with the spontaneous conversion of atrial fibrillation back to normal sinus rhythm with good heart rate control and some periods of bradycardia. Patient still very short of breath with severe sleep apnea hypoxia and lower extremity edema unchanged from before  Review of Systems: Positive for: Leg swelling shortness of breath palpitations Negative for: Vision change, hearing change, syncope, dizziness, nausea, vomiting,diarrhea, bloody stool, stomach pain, cough, congestion, diaphoresis, urinary frequency, urinary pain,skin lesions, skin rashes Others previously listed  Objective: Telemetry: Sinus bradycardia Physical Exam: Blood pressure 122/73, pulse (!) 56, temperature 98 F (36.7 C), temperature source Oral, resp. rate 20, height 5\' 11"  (1.803 m), weight (!) 169.9 kg (374 lb 9 oz), SpO2 94 %. Body mass index is 52.24 kg/m. General: Well developed, well nourished, in no acute distress. Head: Normocephalic, atraumatic, sclera non-icteric, no xanthomas, nares are without discharge. Neck: No apparent masses Lungs: Normal respirations with diffuse wheezes, no rhonchi, no rales , some crackles   Heart: Regular rate and rhythm, normal S1 S2, no murmur, no rub, no gallop, PMI is normal size and placement, carotid upstroke normal without bruit, jugular venous pressure normal Abdomen: Soft, non-tender,  distended with normoactive bowel sounds. No hepatosplenomegaly. Abdominal aorta is normal size without bruit Extremities: 2-3+ left edema, no clubbing, no cyanosis, positive ulcers,  Peripheral: 2+ radial, 2+ femoral, 0 left + dorsal pedal pulses BKA on the right Neuro: Alert and oriented. Moves all extremities spontaneously. Psych:  Responds to questions appropriately with a normal affect.   Intake/Output Summary (Last 24 hours) at  08/11/16 0845 Last data filed at 08/11/16 0530  Gross per 24 hour  Intake          2024.57 ml  Output                0 ml  Net          2024.57 ml    Inpatient Medications:  . apixaban  5 mg Oral BID  . aspirin EC  81 mg Oral Daily  . atorvastatin  10 mg Oral Daily  . chlorpheniramine-HYDROcodone  5 mL Oral Q12H  . docusate sodium  100 mg Oral BID  . insulin aspart  0-20 Units Subcutaneous TID WC  . insulin aspart  0-5 Units Subcutaneous QHS  . insulin aspart  5 Units Subcutaneous TID WC  . insulin glargine  25 Units Subcutaneous BID  . mouth rinse  15 mL Mouth Rinse BID  . pregabalin  100 mg Oral TID  . sodium chloride flush  3 mL Intravenous Q12H  . venlafaxine XR  150 mg Oral BID WC   Infusions:  . sodium chloride 50 mL/hr at 08/11/16 0811  . piperacillin-tazobactam (ZOSYN)  IV 3.375 g (08/11/16 0530)  . vancomycin Stopped (08/11/16 0511)    Labs:  Recent Labs  08/10/16 0707 08/10/16 1129 08/11/16 0430  NA 137  --  134*  K 4.1  --  5.3*  CL 99*  --  98*  CO2 33*  --  28  GLUCOSE 153*  --  145*  BUN 13  --  22*  CREATININE 0.90 0.81 1.49*  CALCIUM 8.0*  --  8.1*    Recent Labs  08/09/16 0432  AST 30  ALT 27  ALKPHOS 117  BILITOT 1.0  PROT 7.0  ALBUMIN 2.9*    Recent Labs  08/10/16  4696 08/11/16 0430  WBC 6.3 7.6  HGB 10.3* 10.6*  HCT 31.7* 32.7*  MCV 81.0 83.2  PLT 267 260   No results for input(s): CKTOTAL, CKMB, TROPONINI in the last 72 hours. Invalid input(s): POCBNP  Recent Labs  08/09/16 0432  HGBA1C 9.7*     Weights: Filed Weights   08/09/16 0411 08/10/16 0404 08/11/16 0304  Weight: (!) 166.8 kg (367 lb 11.6 oz) (!) 169 kg (372 lb 9.2 oz) (!) 169.9 kg (374 lb 9 oz)     Radiology/Studies:  Dg Chest 2 View  Result Date: 08/09/2016 CLINICAL DATA:  Tachycardia.  Elevated blood sugar. EXAM: CHEST  2 VIEW COMPARISON:  09/12/2015. FINDINGS: The heart size and mediastinal contours are within normal limits. Both lungs are clear.  Slight vascular congestion. The visualized skeletal structures are unremarkable. IMPRESSION: Mild vascular congestion. Otherwise, No active cardiopulmonary disease. Similar appearance to priors. Electronically Signed   By: Staci Righter M.D.   On: 08/09/2016 16:39   Dg Tibia/fibula Left  Result Date: 08/08/2016 CLINICAL DATA:  Left lower leg swelling for 2-3 days. No known injury. EXAM: LEFT TIBIA AND FIBULA - 2 VIEW COMPARISON:  None. FINDINGS: There is no acute bony or joint abnormality. Remote healed fracture of the distal diaphysis of the fibula is noted. Soft tissues of the lower leg and imaged foot are swollen. No soft tissue gas collection or radiopaque foreign body. IMPRESSION: Diffuse lower leg and foot soft tissue swelling consistent with dependent change or cellulitis. Remote healed distal fibular fracture. Electronically Signed   By: Inge Rise M.D.   On: 08/08/2016 17:49     Assessment and Recommendation  51 y.o. male with acute on chronic systolic dysfunction congestive heart failure severe sleep apnea lower extremity edema with ulcer and infection and cellulitis of left leg with new onset nonvalvular atrial fibrillation with rapid ventricular rate now spontaneously converted to normal rhythm with amiodarone 1. Continue amiodarone orally to 400 mg a each day for heart rate control and maintenance of normal sinus rhythm 2. Discontinuation of beta blocker due to concerns of bradycardia and reinstate as necessary for heart rate control 3. Continue aggressive sleep apnea and hypoxia treatment with heart failure treatment using other medication management 4. High intensity cholesterol therapy with atorvastatin 5. L a quest for further risk reduction in stroke with recurrent atrial fibrillation 6. No further cardiac diagnostics necessary at this time  Signed, Serafina Royals M.D. FACC

## 2016-08-11 NOTE — Evaluation (Addendum)
Occupational Therapy Evaluation Patient Details Name: Russell [name] MRN: [id number] DOB: May 15, 1965 Today's Date: 08/11/2016    History of Present Illness presented to ER secondary to L LE pain; admitted with cellulitis, abscess to L LE (status post I and D at [place] approx 1 week prior to admission per chart).   Clinical Impression   Pt seen for OT evaluation this date. Pt alert and oriented, follows commands, pleasant and agreeable. Bilat UE/LE strength and ROM grossly symmetrical (except R BKA) and WFL for basic transfers and mobility.  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 Pt fatigued this date, recently had picc lines procedure. Subjectively feels "a bit weak" and stiff, since not moving around as much. Has prosthesis now with him in the room, but typically only wears for "special occasions" using power wc (broken right now) for primary mobility. Reinforced education and importance of skin care, pt verbalized understanding. Not allowing physicians to remove his homemade sutures or debride his wound on his L foot. Pt with challenging home situation at the moment (see notes below), however reports that he manages. Recently lost prescription glasses. Power wc is broken. Pt also reports insulin monitor is broken. Notified RNCM, who is aware. Will maintain on caseload to maximize safety with functional mobility and modified independence with ADL while in the hospital. Anticipate no formal OT needs upon discharge.     Follow Up Recommendations  No OT follow up (anticipate no OT needs upon discharge)    Equipment Recommendations  None recommended by OT    Recommendations for Other Services       Precautions / Restrictions Precautions Precautions: Fall Precaution Comments: R BKA Restrictions Weight Bearing Restrictions: No      Mobility Bed Mobility Overal bed mobility: Modified Independent             General  bed mobility comments: increased effort to perform, but generally safe and able to perform without physical assist  Transfers                 General transfer comment: deferred due to pt's fatigue this afternoon     Balance Overall balance assessment:          Standing balance comment: deferred this session due to fatigue                           ADL either performed or assessed with clinical judgement   ADL Overall ADL's : Modified independent                                       General ADL Comments: Pt generally able to perform basic self care tasks close to baseline functional modified independent level, however increased difficulty due to weakness/poor activity tolerance currently.      Vision Baseline Vision/History: Wears glasses Wears Glasses: At all times (lost his glasses recently) Patient Visual Report: No change from baseline Vision Assessment?: No apparent visual deficits     Perception     Praxis      Pertinent Vitals/Pain Pain Assessment: No/denies pain (discomfort with persistent cough)     Hand Dominance     Extremity/Trunk Assessment Upper Extremity Assessment Upper Extremity Assessment: Overall WFL for tasks assessed (decreased sensation from wrists distally, bilaterally UEs)   Lower Extremity Assessment Lower Extremity Assessment:  Overall [facility] for tasks assessed;Defer to PT evaluation (decreased sensation L knee distally; L wound dressings in place, small abrasion on R knee, distal stump)   Cervical / Trunk Assessment Cervical / Trunk Assessment: Normal   Communication Communication Communication: No difficulties   Cognition Arousal/Alertness: Awake/alert Behavior During Therapy: WFL for tasks assessed/performed Overall Cognitive Status: Within Functional Limits for tasks assessed                                     General Comments       Exercises Other Exercises Other Exercises:  reinforced skin care education for skin inspection/protection with transfers, pressure relief, stressed risks of injury/infection to LLE; pt verbalized understanding   Shoulder Instructions      Home Living Family/patient expects to be discharged to:: Shelter/Homeless                                 Additional Comments: reports he has been living "in the gym" prior to admission      Prior Functioning/Environment Level of Independence: Independent with assistive device(s)        Comments: Mod indep for ADLs and basic transfer (SPT without assist device); power WC as primary mobility. Pt reports he uses gym membership for working out, take showers, uses [employer] for storage of medications and belongings        OT Problem List: Decreased activity tolerance      OT Treatment/Interventions: Self-care/ADL training;DME and/or AE instruction;Therapeutic exercise;Patient/family education;Energy conservation    OT Goals(Current goals can be found in the care plan section) Acute Rehab OT Goals Patient Stated Goal: feel stronger OT Goal Formulation: With patient Time For Goal Achievement: 08/25/16 Potential to Achieve Goals: Good  OT Frequency: Min 1X/week   Barriers to D/C: Decreased caregiver support;Inaccessible home environment  homeless, no reliable/consistent family support available       Co-evaluation              AM-PAC PT "6 Clicks" Daily Activity     Outcome Measure Help from another person eating meals?: None Help from another person taking care of personal grooming?: None Help from another person toileting, which includes using toliet, bedpan, or urinal?: A Little Help from another person bathing (including washing, rinsing, drying)?: A Little Help from another person to put on and taking off regular upper body clothing?: None Help from another person to put on and taking off regular lower body clothing?: None 6 Click Score: 22   End of  Session    Activity Tolerance: Patient limited by fatigue Patient left: in bed;with call bell/phone within reach;with bed alarm set;with nursing/sitter in room  OT Visit Diagnosis: Other abnormalities of gait and mobility (R26.89)                Time: 3354-5625 OT Time Calculation (min): 26 min Charges:  OT General Charges $OT Visit: 1 Procedure OT Evaluation $OT Eval Low Complexity: 1 Procedure OT Treatments $Self Care/Home Management : 8-22 mins G-Codes:     Jeni Salles, MPH, MS, OTR/L ascom [phone] 08/11/16, 4:17 PM

## 2016-08-12 LAB — BASIC METABOLIC PANEL
Anion gap: 5 (ref 5–15)
BUN: 22 mg/dL — ABNORMAL HIGH (ref 6–20)
CALCIUM: 8 mg/dL — AB (ref 8.9–10.3)
CO2: 31 mmol/L (ref 22–32)
CREATININE: 1.68 mg/dL — AB (ref 0.61–1.24)
Chloride: 101 mmol/L (ref 101–111)
GFR, EST AFRICAN AMERICAN: 52 mL/min — AB (ref >60)
GFR, EST NON AFRICAN AMERICAN: 45 mL/min — AB (ref >60)
Glucose, Bld: 69 mg/dL (ref 65–99)
Potassium: 4 mmol/L (ref 3.5–5.1)
SODIUM: 137 mmol/L (ref 135–145)

## 2016-08-12 LAB — GLUCOSE, CAPILLARY
GLUCOSE-CAPILLARY: 61 mg/dL — AB (ref 65–99)
GLUCOSE-CAPILLARY: 82 mg/dL (ref 65–99)
GLUCOSE-CAPILLARY: 129 mg/dL — AB (ref 65–99)
Glucose-Capillary: 130 mg/dL — ABNORMAL HIGH (ref 65–99)

## 2016-08-12 MED ORDER — INSULIN ASPART 100 UNIT/ML ~~LOC~~ SOLN: 5.0000 [IU] | Freq: Three times a day (TID) | SUBCUTANEOUS | 11 refills | Status: DC

## 2016-08-12 MED ORDER — APIXABAN 5 MG PO TABS: 5.0000 mg | ORAL_TABLET | Freq: Two times a day (BID) | ORAL | 0 refills | Status: DC

## 2016-08-12 MED ORDER — INSULIN GLARGINE 100 UNIT/ML ~~LOC~~ SOLN: 28.0000 [IU] | Freq: Two times a day (BID) | SUBCUTANEOUS | 11 refills | Status: DC

## 2016-08-12 MED ORDER — FINASTERIDE 5 MG PO TABS: 5.0000 mg | ORAL_TABLET | Freq: Every day | ORAL | 0 refills | Status: DC

## 2016-08-12 MED ORDER — AMIODARONE HCL 400 MG PO TABS: 400.0000 mg | ORAL_TABLET | Freq: Every day | ORAL | 0 refills | Status: DC

## 2016-08-12 MED ORDER — PIPERACILLIN-TAZOBACTAM 3.375 G IVPB: 3.3750 g | Freq: Three times a day (TID) | INTRAVENOUS | 0 refills | Status: AC

## 2016-08-12 MED ORDER — TRAMADOL HCL 50 MG PO TABS: 50.0000 mg | ORAL_TABLET | Freq: Four times a day (QID) | ORAL | 0 refills | Status: DC | PRN

## 2016-08-12 MED ORDER — TAMSULOSIN HCL 0.4 MG PO CAPS: 0.4000 mg | ORAL_CAPSULE | Freq: Every day | ORAL | 0 refills | Status: DC

## 2016-08-12 MED ORDER — FUROSEMIDE 40 MG PO TABS: 40.0000 mg | ORAL_TABLET | Freq: Every day | ORAL | 0 refills | Status: DC

## 2016-08-12 NOTE — Discharge Instructions (Signed)
Discharge antibiotics Zosyn              3.375   grams every    8          hours   PICC Care per protocol Labs weekly while on IV antibiotics      CBC w diff         Cr, LFTs           CRP      Need to follow BMP for renal func in 3 days.  Planned duration of antibiotics 2-4 weeks  Stop date        09/03/16  Follow up clinic date            Week of 7/6      FAX weekly labs to [phone]  Leonel Ramsay, MD

## 2016-08-12 NOTE — Progress Notes (Signed)
Infectious Disease Long Term IV Antibiotic Orders  Diagnosis:  LLE cellulitis and wound   Culture results Pending, GNR  Allergies:  Allergies  Allergen Reactions  . Shellfish Allergy Hives    Discharge antibiotics Zosyn  3.375 grams every 8 hours   PICC Care per protocol Labs weekly while on IV antibiotics      CBC w diff   Cr, LFTs  CRP    Planned duration of antibiotics 2-4 weeks  Stop date 09/03/16 Follow up clinic date Week of 7/6  FAX weekly labs to [phone]  Leonel Ramsay, MD

## 2016-08-12 NOTE — Clinical Social Work Note (Addendum)
Patient to be d/c'ed today to [facility].  Patient and family agreeable to plans will transport via ems RN to call report to [phone] room 34B.  Packet by patient's chart.  Evette Cristal, MSW, [place]

## 2016-08-12 NOTE — Progress Notes (Signed)
Report called to [facility] care earlier today as well as EMS called to notified patient would be ready for pick up at 6. Patient finishing up dose of IV abx, then EMS to arrive to transport patient to facility. Patient aware of situation, SW aware of situation as well. Patient's family member called to pick up scooter as scooter can not be transported via EMS.

## 2016-08-12 NOTE — Discharge Summary (Signed)
[place] [facility] [place] NAME: Russell [name]    MR#:  [id number]  DATE OF BIRTH:  1966/01/30  DATE OF ADMISSION:  08/08/2016 ADMITTING PHYSICIAN: Idelle Crouch, MD  DATE OF DISCHARGE: 08/12/2016   PRIMARY CARE PHYSICIAN: System, Pcp Not In    ADMISSION DIAGNOSIS:  Hyperglycemia [R73.9] Left leg cellulitis [F57.322] Sepsis, due to unspecified organism [facility]) [A41.9] Diabetic ulcer of left lower leg ([place]) [G25.427, L97.929]  DISCHARGE DIAGNOSIS:  Principal Problem:   Cellulitis and abscess of left leg Active Problems:   Diabetes with ulcer of leg ([place])   Hyperglycemia   Morbid obesity ([place])   Cellulitis and abscess of leg   SECONDARY DIAGNOSIS:   Past Medical History:  Diagnosis Date  . CHF (congestive heart failure) ([place])   . Depression   . Diabetes mellitus without complication ([place])   . Hyperlipemia   . Hypertension     HOSPITAL COURSE:   #1. Cellulitis and abscess of left lower extremity, status post incision and drainage at [facility] 07/27/2016, not cultured at [facility], according to Dr. Jacquiline Doe, off vancomycin continue Zosyn , place PICC line, appreciate Dr. Ola Spurr recommendations, blood cultures so far are negative, MRSA PCR is negative. Wound care specialist, saw patient in consultation and placed Unna boot.  Wound culture is pending, patient refuses to and do homemade sutures, will need intravenous antibiotic therapy via PICC line, per ID   ID suggest Zosyn IV until 09/03/16. Rehab placement arranged. #2. Diabetes mellitus, type II, insulin-dependent, advance  insulin Lantus to 28  units twice a day, continue sliding scale #3. A. fib, RVR, converted to sinus rhythm on amiodarone IV drip and Toprol-XL,  Now offToprol-XL, continue amiodarone orally, appreciate cardiology's input, echocardiogram revealed severely decreased left ventricular systolic function, ejection fraction of 06-23%, grade 2 diastolic  dysfunction as per echocardiogram in May 2018 at [facility], will not repeat echocardiogram,continue Eliquis    no betablocker due to concern of bradycardia. Follow in clinic with cardiologist. #4. Acute on chronic combined systolic and diastolic CHF, held Lasix for today due to acute renal failure, reassess creatinine the morning. Can resume Lasix tomorrow, following output, weight, no accurate urine output measurements  on d/c resume lasix with lower dose, and will check renal func In 3 days. #5. Morbid obesity, obstructive sleep apnea, obesity hypoventilation syndrome, auto CPAP was not initiated last night, he needs sleep study as outpatient #6. Lactic acidosis, off IV fluids due to significant fluid overload #7. Generalized weakness, patient would benefit from skilled nursing facility placement , discussed with care management before, awaiting for physical therapist recommendations, need rehab placement. #8 acute urinary retention, bladder scan above 600 ml, place Foley to drain urine, initiate finasteride, Flomax, dc Foley for voiding trial in about 1 week, follow creatinine  DISCHARGE CONDITIONS:   Stable.  CONSULTS OBTAINED:  Treatment Team:  Leonel Ramsay, MD Corey Skains, MD  DRUG ALLERGIES:   Allergies  Allergen Reactions  . Shellfish Allergy Hives    DISCHARGE MEDICATIONS:   Current Discharge Medication List    START taking these medications   Details  amiodarone (PACERONE) 400 MG tablet Take 1 tablet (400 mg total) by mouth daily. Qty: 20 tablet, Refills: 0    apixaban (ELIQUIS) 5 MG TABS tablet Take 1 tablet (5 mg total) by mouth 2 (two) times daily. Qty: 60 tablet, Refills: 0    finasteride (PROSCAR) 5 MG tablet Take 1 tablet (5 mg total) by mouth  daily. Qty: 30 tablet, Refills: 0    insulin aspart (NOVOLOG) 100 UNIT/ML injection Inject 5 Units into the skin 3 (three) times daily with meals. Qty: 10 mL, Refills: 11    insulin glargine (LANTUS) 100 UNIT/ML  injection Inject 0.28 mLs (28 Units total) into the skin 2 (two) times daily. Qty: 10 mL, Refills: 11    piperacillin-tazobactam (ZOSYN) 3.375 GM/50ML IVPB Inject 50 mLs (3.375 g total) into the vein every 8 (eight) hours. Qty: 1000 mL, Refills: 0    tamsulosin (FLOMAX) 0.4 MG CAPS capsule Take 1 capsule (0.4 mg total) by mouth daily. Qty: 30 capsule, Refills: 0    traMADol (ULTRAM) 50 MG tablet Take 1 tablet (50 mg total) by mouth every 6 (six) hours as needed for moderate pain. Qty: 30 tablet, Refills: 0      CONTINUE these medications which have CHANGED   Details  furosemide (LASIX) 40 MG tablet Take 1 tablet (40 mg total) by mouth daily. Qty: 30 tablet, Refills: 0      CONTINUE these medications which have NOT CHANGED   Details  aspirin EC 81 MG tablet Take 81 mg by mouth daily.    atorvastatin (LIPITOR) 10 MG tablet Take 10 mg by mouth daily.    nitroGLYCERIN (NITROSTAT) 0.4 MG SL tablet Place 0.4 mg under the tongue every 5 (five) minutes as needed for chest pain.    pregabalin (LYRICA) 100 MG capsule Take 100 mg by mouth 3 (three) times daily.    citalopram (CELEXA) 20 MG tablet Take 20 mg by mouth daily.    venlafaxine XR (EFFEXOR-XR) 150 MG 24 hr capsule Take 150 mg by mouth 2 (two) times daily.      STOP taking these medications     insulin regular (NOVOLIN R,HUMULIN R) 100 units/mL injection      lidocaine (XYLOCAINE) 5 % ointment      lisinopril (PRINIVIL,ZESTRIL) 10 MG tablet      metolazone (ZAROXOLYN) 2.5 MG tablet      metoprolol succinate (TOPROL-XL) 25 MG 24 hr tablet      potassium chloride SA (K-DUR,KLOR-CON) 20 MEQ tablet      torsemide (DEMADEX) 100 MG tablet      busPIRone (BUSPAR) 7.5 MG tablet      ibuprofen (ADVIL,MOTRIN) 600 MG tablet          DISCHARGE INSTRUCTIONS:    Follow with ID clinic as advised and with cardiologist clinic.  Discharge antibiotics Zosyn              3.375   grams every    8          hours   PICC  Care per protocol Labs weekly while on IV antibiotics      CBC w diff         Cr, LFTs           CRP        Planned duration of antibiotics 2-4 weeks  Stop date        09/03/16  Follow up clinic date            Week of 7/6      FAX weekly labs to [phone]  Leonel Ramsay, MD   If you experience worsening of your admission symptoms, develop shortness of breath, life threatening emergency, suicidal or homicidal thoughts you must seek medical attention immediately by calling 911 or calling your MD immediately  if symptoms less severe.  You Must read complete  instructions/literature along with all the possible adverse reactions/side effects for all the Medicines you take and that have been prescribed to you. Take any new Medicines after you have completely understood and accept all the possible adverse reactions/side effects.   Please note  You were cared for by a hospitalist during your hospital stay. If you have any questions about your discharge medications or the care you received while you were in the hospital after you are discharged, you can call the unit and asked to speak with the hospitalist on call if the hospitalist that took care of you is not available. Once you are discharged, your primary care physician will handle any further medical issues. Please note that NO REFILLS for any discharge medications will be authorized once you are discharged, as it is imperative that you return to your primary care physician (or establish a relationship with a primary care physician if you do not have one) for your aftercare needs so that they can reassess your need for medications and monitor your lab values.    Today   CHIEF COMPLAINT:   Chief Complaint  Patient presents with  . pt would like placement/speak to Education officer, museum  . Hyperglycemia    HISTORY OF PRESENT ILLNESS:  Russell [name]  is a 51 y.o. male with a known history of DM, PVD, and morbid obesity now with  progressive LLE redness and pain despite po ABX. In ER, sugars very high. LLE ulcer/abcess with diffuse edema and erythema noted. He is now admitted. No fever. Denies CP or SOB. Has not taken his insulin regualrly   VITAL SIGNS:  Blood pressure 128/67, pulse 70, temperature 97.8 F (36.6 C), temperature source Oral, resp. rate 16, height 5\' 11"  (1.803 m), weight (!) 171 kg (376 lb 15.8 oz), SpO2 91 %.  I/O:   Intake/Output Summary (Last 24 hours) at 08/12/16 1054 Last data filed at 08/12/16 0921  Gross per 24 hour  Intake              410 ml  Output             1400 ml  Net             -990 ml    PHYSICAL EXAMINATION:   GENERAL:  51 y.o.-year-old morbidly obese patient lying in the bed with no acute distress.  EYES: Pupils equal, round, reactive to light and accommodation. No scleral icterus. Extraocular muscles intact.  HEENT: Head atraumatic, normocephalic. Oropharynx and nasopharynx clear.  NECK:  Supple, no jugular venous distention. No thyroid enlargement, no tenderness.  LUNGS: Markedly diminished breath sounds bilaterally, scattered wheezing, no rales,rhonchi or crepitation. No use of accessory muscles of respiration.  CARDIOVASCULAR: S1, S2 , irregularly irregular, tachycardic, difficult to assess for murmurs due to significant obesity. No murmurs, rubs, or gallops.  ABDOMEN: Soft, nontender, nondistended. Bowel sounds present. No organomegaly or mass.  EXTREMITIES: Bilateral lower extremity and pedal edema,, status post right BKA, no cyanosis, or clubbing. Left lower extremity in Unna boot NEUROLOGIC: Cranial nerves II through XII are intact. Muscle strength 5/5 in all extremities. Sensation intact. Gait not checked.  PSYCHIATRIC: The patient is alert and oriented x 3.  SKIN: No obvious rash, lesion, or ulcer.   DATA REVIEW:   CBC  Recent Labs Lab 08/11/16 0430  WBC 7.6  HGB 10.6*  HCT 32.7*  PLT 260    Chemistries   Recent Labs Lab 08/09/16 0432   08/12/16 0550  NA 138  < >  137  K 4.1  < > 4.0  CL 98*  < > 101  CO2 32  < > 31  GLUCOSE 344*  < > 69  BUN 14  < > 22*  CREATININE 1.03  < > 1.68*  CALCIUM 8.2*  < > 8.0*  AST 30  --   --   ALT 27  --   --   ALKPHOS 117  --   --   BILITOT 1.0  --   --   < > = values in this interval not displayed.  Cardiac Enzymes No results for input(s): TROPONINI in the last 168 hours.  Microbiology Results  Results for orders placed or performed during the hospital encounter of 08/08/16  Blood Culture (routine x 2)     Status: None (Preliminary result)   Collection Time: 08/08/16  6:21 PM  Result Value Ref Range Status   Specimen Description BLOOD RIGHT ASSIST CONTROL  Final   Special Requests   Final    BOTTLES DRAWN AEROBIC AND ANAEROBIC Blood Culture adequate volume   Culture NO GROWTH 4 DAYS  Final   Report Status PENDING  Incomplete  Blood Culture (routine x 2)     Status: None (Preliminary result)   Collection Time: 08/08/16  6:21 PM  Result Value Ref Range Status   Specimen Description BLOOD RIGHT ASSIST CONTROL  Final   Special Requests   Final    BOTTLES DRAWN AEROBIC AND ANAEROBIC Blood Culture results may not be optimal due to an inadequate volume of blood received in culture bottles   Culture NO GROWTH 4 DAYS  Final   Report Status PENDING  Incomplete  MRSA PCR Screening     Status: None   Collection Time: 08/09/16  9:01 AM  Result Value Ref Range Status   MRSA by PCR NEGATIVE NEGATIVE Final    Comment:        The GeneXpert MRSA Assay (FDA approved for NASAL specimens only), is one component of a comprehensive MRSA colonization surveillance program. It is not intended to diagnose MRSA infection nor to guide or monitor treatment for MRSA infections.   Aerobic Culture (superficial specimen)     Status: None (Preliminary result)   Collection Time: 08/10/16  3:34 PM  Result Value Ref Range Status   Specimen Description LEG  Final   Special Requests Normal  Final    Gram Stain   Final    FEW WBC PRESENT, PREDOMINANTLY PMN NO SQUAMOUS EPITHELIAL CELLS SEEN MODERATE GRAM NEGATIVE RODS MODERATE GRAM POSITIVE COCCI IN PAIRS    Culture   Final    MODERATE GRAM NEGATIVE RODS IDENTIFICATION AND SUSCEPTIBILITIES TO FOLLOW Performed at [facility], [place] [address] [place], [place] [address]    Report Status PENDING  Incomplete    RADIOLOGY:  No results found.  EKG:   Orders placed or performed during the hospital encounter of 08/08/16  . ED EKG  . ED EKG  . EKG 12-Lead  . EKG 12-Lead  . EKG 12-Lead  . EKG 12-Lead      Management plans discussed with the patient, family and they are in agreement.  CODE STATUS:     Code Status Orders        Start     Ordered   08/08/16 2114  Full code  Continuous     08/08/16 2114    Code Status History    Date Active Date Inactive Code Status Order ID Comments User Context  This patient has a current code status but no historical code status.      TOTAL TIME TAKING CARE OF THIS PATIENT: 35 minutes.    Vaughan Basta M.D on 08/12/2016 at 10:54 AM  Between 7am to 6pm - Pager - [phone]  After 6pm go to [email or web address] - password [email or web address] [facility]  Office  [phone]  CC: Primary care physician; System, Pcp Not In   Note: This dictation was prepared with Dragon dictation along with smaller phrase technology. Any transcriptional errors that result from this process are unintentional.

## 2016-08-12 NOTE — Progress Notes (Signed)
[facility] Encounter Note  Patient: Russell [name] / Admit Date: 08/08/2016 / Date of Encounter: 08/12/2016, 8:15 AM   Subjective: Patient with the spontaneous conversion of atrial fibrillation back to normal sinus rhythm with good heart rate control and some periods of bradycardia. Patient still very short of breath with severe sleep apnea hypoxia and lower extremity edema unchanged from before. Patient overall feels better  Review of Systems: Positive for: Leg swelling shortness of breath palpitations Negative for: Vision change, hearing change, syncope, dizziness, nausea, vomiting,diarrhea, bloody stool, stomach pain, cough, congestion, diaphoresis, urinary frequency, urinary pain,skin lesions, skin rashes Others previously listed  Objective: Telemetry: Sinus bradycardia Physical Exam: Blood pressure 128/77, pulse 61, temperature 97.6 F (36.4 C), temperature source Oral, resp. rate 16, height 5\' 11"  (1.803 m), weight (!) 171 kg (376 lb 15.8 oz), SpO2 92 %. Body mass index is 52.58 kg/m. General: Well developed, well nourished, in no acute distress. Head: Normocephalic, atraumatic, sclera non-icteric, no xanthomas, nares are without discharge. Neck: No apparent masses Lungs: Normal respirations with diffuse wheezes, no rhonchi, no rales , some crackles   Heart: Regular rate and rhythm, normal S1 S2, no murmur, no rub, no gallop, PMI is normal size and placement, carotid upstroke normal without bruit, jugular venous pressure normal Abdomen: Soft, non-tender,  distended with normoactive bowel sounds. No hepatosplenomegaly. Abdominal aorta is normal size without bruit Extremities: 2-3+ left edema, no clubbing, no cyanosis, positive ulcers,  Peripheral: 2+ radial, 2+ femoral, 0 left + dorsal pedal pulses BKA on the right Neuro: Alert and oriented. Moves all extremities spontaneously. Psych:  Responds to questions appropriately with a normal affect.   Intake/Output  Summary (Last 24 hours) at 08/12/16 0815 Last data filed at 08/12/16 0406  Gross per 24 hour  Intake              290 ml  Output             1400 ml  Net            -1110 ml    Inpatient Medications:  . amiodarone  400 mg Oral Daily  . apixaban  5 mg Oral BID  . aspirin EC  81 mg Oral Daily  . atorvastatin  10 mg Oral Daily  . chlorpheniramine-HYDROcodone  5 mL Oral Q12H  . docusate sodium  100 mg Oral BID  . finasteride  5 mg Oral Daily  . insulin aspart  0-20 Units Subcutaneous TID WC  . insulin aspart  0-5 Units Subcutaneous QHS  . insulin aspart  5 Units Subcutaneous TID WC  . insulin glargine  28 Units Subcutaneous BID  . mouth rinse  15 mL Mouth Rinse BID  . pregabalin  100 mg Oral TID  . sodium chloride flush  10-40 mL Intracatheter Q12H  . sodium chloride flush  3 mL Intravenous Q12H  . tamsulosin  0.4 mg Oral Daily  . venlafaxine XR  150 mg Oral BID WC   Infusions:  . piperacillin-tazobactam (ZOSYN)  IV 3.375 g (08/12/16 0603)    Labs:  Recent Labs  08/11/16 1122 08/12/16 0550  NA 134* 137  K 4.6 4.0  CL 98* 101  CO2 28 31  GLUCOSE 150* 69  BUN 22* 22*  CREATININE 1.69* 1.68*  CALCIUM 8.2* 8.0*   No results for input(s): AST, ALT, ALKPHOS, BILITOT, PROT, ALBUMIN in the last 72 hours.  Recent Labs  08/10/16 0707 08/11/16 0430  WBC 6.3 7.6  HGB 10.3*  10.6*  HCT 31.7* 32.7*  MCV 81.0 83.2  PLT 267 260   No results for input(s): CKTOTAL, CKMB, TROPONINI in the last 72 hours. Invalid input(s): POCBNP No results for input(s): HGBA1C in the last 72 hours.   Weights: Filed Weights   08/10/16 0404 08/11/16 0304 08/12/16 0406  Weight: (!) 169 kg (372 lb 9.2 oz) (!) 169.9 kg (374 lb 9 oz) (!) 171 kg (376 lb 15.8 oz)     Radiology/Studies:  Dg Chest 2 View  Result Date: 08/09/2016 CLINICAL DATA:  Tachycardia.  Elevated blood sugar. EXAM: CHEST  2 VIEW COMPARISON:  09/12/2015. FINDINGS: The heart size and mediastinal contours are within normal  limits. Both lungs are clear. Slight vascular congestion. The visualized skeletal structures are unremarkable. IMPRESSION: Mild vascular congestion. Otherwise, No active cardiopulmonary disease. Similar appearance to priors. Electronically Signed   By: Staci Righter M.D.   On: 08/09/2016 16:39   Dg Tibia/fibula Left  Result Date: 08/08/2016 CLINICAL DATA:  Left lower leg swelling for 2-3 days. No known injury. EXAM: LEFT TIBIA AND FIBULA - 2 VIEW COMPARISON:  None. FINDINGS: There is no acute bony or joint abnormality. Remote healed fracture of the distal diaphysis of the fibula is noted. Soft tissues of the lower leg and imaged foot are swollen. No soft tissue gas collection or radiopaque foreign body. IMPRESSION: Diffuse lower leg and foot soft tissue swelling consistent with dependent change or cellulitis. Remote healed distal fibular fracture. Electronically Signed   By: Inge Rise M.D.   On: 08/08/2016 17:49     Assessment and Recommendation  51 y.o. male with acute on chronic systolic dysfunction congestive heart failure severe sleep apnea lower extremity edema with ulcer and infection and cellulitis of left leg with new onset nonvalvular atrial fibrillation with rapid ventricular rate now spontaneously converted to normal rhythm with amiodarone 1. Continue amiodarone orally to 400 mg a each day for heart rate control and maintenance of normal sinus rhythmUntil next week when seen in the office and we will decrease to 200 mg 2. Abstain from of beta blocker due to concerns of bradycardia and reinstate as necessary for heart rate control 3. Continue aggressive sleep apnea and hypoxia treatment with heart failure treatment using other medication management 4. High intensity cholesterol therapy with atorvastatin 5. eli quest for further risk reduction in stroke with recurrent atrial fibrillation 6. No further cardiac diagnostics necessary at this time 7. Okay for discharge home from cardiac  standpoint with follow-up in next week for adjustments of medications  Signed, Serafina Royals M.D. FACC

## 2016-08-12 NOTE — Clinical Social Work Note (Signed)
CSW presented bed offers to patient and he decided to go to [facility].  CSW contacted [facility], and they can accept patient once he is medically ready for discharge and orders have been received.  Jones Broom. Clark, MSW, [place]  08/12/2016 9:29 AM

## 2016-08-12 NOTE — Care Management Important Message (Signed)
Important Message  Patient Details  Name: Russell [name] MRN: [id number] Date of Birth: 1966/02/14   Medicare Important Message Given:  Yes Signed IM notice given   Katrina Stack, RN 08/12/2016, 2:08 PM

## 2016-08-13 LAB — CULTURE, BLOOD (ROUTINE X 2)
Culture: NO GROWTH
Culture: NO GROWTH
SPECIAL REQUESTS: ADEQUATE

## 2016-08-13 LAB — AEROBIC CULTURE  (SUPERFICIAL SPECIMEN): SPECIAL REQUESTS: NORMAL

## 2016-08-13 LAB — AEROBIC CULTURE W GRAM STAIN (SUPERFICIAL SPECIMEN)

## 2016-11-20 ENCOUNTER — Emergency Department
Admission: EM | Admit: 2016-11-20 | Discharge: 2016-11-20 | Disposition: A | Discharge status: Home / Self Care | Payer: Medicare Other | Attending: Emergency Medicine | Admitting: Emergency Medicine

## 2016-11-20 ENCOUNTER — Encounter: Payer: Self-pay | Admitting: Emergency Medicine

## 2016-11-20 DIAGNOSIS — E1165 Type 2 diabetes mellitus with hyperglycemia: Secondary | ICD-10-CM | POA: Insufficient documentation

## 2016-11-20 DIAGNOSIS — I10 Essential (primary) hypertension: Secondary | ICD-10-CM | POA: Insufficient documentation

## 2016-11-20 DIAGNOSIS — Z7982 Long term (current) use of aspirin: Secondary | ICD-10-CM | POA: Insufficient documentation

## 2016-11-20 DIAGNOSIS — Z79899 Other long term (current) drug therapy: Secondary | ICD-10-CM | POA: Diagnosis not present

## 2016-11-20 DIAGNOSIS — R739 Hyperglycemia, unspecified: Secondary | ICD-10-CM

## 2016-11-20 DIAGNOSIS — E11622 Type 2 diabetes mellitus with other skin ulcer: Secondary | ICD-10-CM | POA: Insufficient documentation

## 2016-11-20 DIAGNOSIS — Z794 Long term (current) use of insulin: Secondary | ICD-10-CM | POA: Diagnosis not present

## 2016-11-20 DIAGNOSIS — L97229 Non-pressure chronic ulcer of left calf with unspecified severity: Secondary | ICD-10-CM | POA: Insufficient documentation

## 2016-11-20 LAB — URINALYSIS, COMPLETE (UACMP) WITH MICROSCOPIC
BILIRUBIN URINE: NEGATIVE
Bacteria, UA: NONE SEEN
Glucose, UA: >=500 mg/dL — AB
Ketones, ur: 5 mg/dL — AB
LEUKOCYTES UA: NEGATIVE
Nitrite: NEGATIVE
PH: 6 (ref 5.0–8.0)
Protein, ur: NEGATIVE mg/dL
SPECIFIC GRAVITY, URINE: 1.022 (ref 1.005–1.030)

## 2016-11-20 LAB — COMPREHENSIVE METABOLIC PANEL
ALBUMIN: 3.4 g/dL — AB (ref 3.5–5.0)
ALT: 19 U/L (ref 17–63)
ANION GAP: 7 (ref 5–15)
AST: 21 U/L (ref 15–41)
Alkaline Phosphatase: 82 U/L (ref 38–126)
BILIRUBIN TOTAL: 0.7 mg/dL (ref 0.3–1.2)
BUN: 15 mg/dL (ref 6–20)
CHLORIDE: 99 mmol/L — AB (ref 101–111)
CO2: 27 mmol/L (ref 22–32)
Calcium: 8.3 mg/dL — ABNORMAL LOW (ref 8.9–10.3)
Creatinine, Ser: 0.81 mg/dL (ref 0.61–1.24)
GFR calc Af Amer: >60 mL/min (ref >60)
GFR calc non Af Amer: >60 mL/min (ref >60)
GLUCOSE: 293 mg/dL — AB (ref 65–99)
POTASSIUM: 4.1 mmol/L (ref 3.5–5.1)
SODIUM: 133 mmol/L — AB (ref 135–145)
TOTAL PROTEIN: 6.9 g/dL (ref 6.5–8.1)

## 2016-11-20 LAB — GLUCOSE, CAPILLARY: Glucose-Capillary: 185 mg/dL — ABNORMAL HIGH (ref 65–99)

## 2016-11-20 LAB — CBC WITH DIFFERENTIAL/PLATELET
BASOS ABS: 0 10*3/uL (ref 0–0.1)
BASOS PCT: 1 %
EOS ABS: 0.1 10*3/uL (ref 0–0.7)
EOS PCT: 2 %
HCT: 39.6 % — ABNORMAL LOW (ref 40.0–52.0)
Hemoglobin: 13.3 g/dL (ref 13.0–18.0)
Lymphocytes Relative: 18 %
Lymphs Abs: 1.1 10*3/uL (ref 1.0–3.6)
MCH: 26.1 pg (ref 26.0–34.0)
MCHC: 33.5 g/dL (ref 32.0–36.0)
MCV: 77.8 fL — ABNORMAL LOW (ref 80.0–100.0)
MONO ABS: 0.4 10*3/uL (ref 0.2–1.0)
MONOS PCT: 7 %
Neutro Abs: 4.3 10*3/uL (ref 1.4–6.5)
Neutrophils Relative %: 72 %
PLATELETS: 166 10*3/uL (ref 150–440)
RBC: 5.08 MIL/uL (ref 4.40–5.90)
RDW: 14.9 % — AB (ref 11.5–14.5)
WBC: 6 10*3/uL (ref 3.8–10.6)

## 2016-11-20 NOTE — ED Triage Notes (Addendum)
Pt here because wants to be placed in nursing home.  Brought to ED by social worker per his report for placement.  Wants to get placement before gets sick. NAD.  Cannot take care of himself.  Pt was here earlier this morning asking for a shower.  VSS.  Discussed with dr Alfred Levins

## 2016-11-20 NOTE — Clinical Social Work Note (Signed)
The CSW met again with the patient to discuss plans past discharge. The CSW offered a taxi voucher to the patient to discharge to the [facility] having called the [facility], [place]. The patient decided that he would not discharge to the shelter citing that he feels that he is not going to be welcome due to his medical limitations. The CSW provided the triage representatives with the contact number should the patient return prior to 7pm to request the taxi voucher. The CSW will continue to follow until [place], MSW, [place]

## 2016-11-20 NOTE — ED Provider Notes (Signed)
[facility] Emergency Department Provider Note    ____________________________________________   I have reviewed the triage vital signs and the nursing notes.   HISTORY  Chief Complaint Wants placement  History limited by: Not Limited   HPI Russell [name] is a 51 y.o. male who presents to the emergency department today at the request of social worker because of her concern for possible DKA or possible infection. The patient himself states that he is more concerned that he has not had a shower in a few days. He does state that he has diabetes and that his sugars have been high. Additionally he also states that he has had a wound on his left leg. It has been there for a long time. He denies any fevers. Denies any associated chest pain or shortness of breath.   Past Medical History:  Diagnosis Date  . CHF (congestive heart failure) ([place])   . Depression   . Diabetes mellitus without complication ([place])   . Hyperlipemia   . Hypertension     Patient Active Problem List   Diagnosis Date Noted  . Cellulitis and abscess of left leg 08/08/2016  . Diabetes with ulcer of leg ([place]) 08/08/2016  . Hyperglycemia 08/08/2016  . Morbid obesity ([place]) 08/08/2016  . Cellulitis and abscess of leg 08/08/2016  . Adjustment disorder with depressed mood 11/21/2014  . Other social stressor 11/21/2014  . 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 Diabetes ([place]) 11/21/2014    Past Surgical History:  Procedure Laterality Date  . BELOW KNEE LEG AMPUTATION     Right leg    Prior to Admission medications   Medication Sig Start Date End Date Taking? Authorizing Provider  amiodarone (PACERONE) 400 MG tablet Take 1 tablet (400 mg total) by mouth daily. 08/13/16   Vaughan Basta, MD  apixaban (ELIQUIS) 5 MG TABS tablet Take 1 tablet (5 mg total) by mouth 2 (two) times daily. 08/12/16   Vaughan Basta, MD  aspirin EC 81 MG tablet Take 81 mg by mouth daily.     [provider]  atorvastatin (LIPITOR) 10 MG tablet Take 10 mg by mouth daily.    [provider]  citalopram (CELEXA) 20 MG tablet Take 20 mg by mouth daily.    [provider]  finasteride (PROSCAR) 5 MG tablet Take 1 tablet (5 mg total) by mouth daily. 08/13/16   Vaughan Basta, MD  furosemide (LASIX) 40 MG tablet Take 1 tablet (40 mg total) by mouth daily. 08/12/16   Vaughan Basta, MD  insulin aspart (NOVOLOG) 100 UNIT/ML injection Inject 5 Units into the skin 3 (three) times daily with meals. 08/12/16   Vaughan Basta, MD  insulin glargine (LANTUS) 100 UNIT/ML injection Inject 0.28 mLs (28 Units total) into the skin 2 (two) times daily. 08/12/16   Vaughan Basta, MD  nitroGLYCERIN (NITROSTAT) 0.4 MG SL tablet Place 0.4 mg under the tongue every 5 (five) minutes as needed for chest pain.    [provider]  pregabalin (LYRICA) 100 MG capsule Take 100 mg by mouth 3 (three) times daily.    [provider]  tamsulosin (FLOMAX) 0.4 MG CAPS capsule Take 1 capsule (0.4 mg total) by mouth daily. 08/13/16   Vaughan Basta, MD  traMADol (ULTRAM) 50 MG tablet Take 1 tablet (50 mg total) by mouth every 6 (six) hours as needed for moderate pain. 08/12/16   Vaughan Basta, MD  venlafaxine XR (EFFEXOR-XR) 150 MG 24 hr capsule Take 150 mg by mouth 2 (  two) times daily.    [provider]    Allergies Shellfish allergy  History reviewed. No pertinent family history.  Social History Social History  Substance Use Topics  . Smoking status: Never Smoker  . Smokeless tobacco: Never Used  . Alcohol use No    Review of Systems Constitutional: No fever/chills Eyes: No visual changes. ENT: No sore throat. Cardiovascular: Denies chest pain. Respiratory: Denies shortness of breath. Gastrointestinal: No abdominal pain.  No nausea, no vomiting.  No diarrhea.   Genitourinary: Negative for  dysuria. Musculoskeletal: Negative for back pain. Skin: Positive for wound to left lower leg.  Neurological: Negative for headaches, focal weakness or numbness.  ____________________________________________   PHYSICAL EXAM:  VITAL SIGNS: ED Triage Vitals [11/20/16 1258]  Enc Vitals Group     BP 133/79     Pulse Rate 66     Resp 18     Temp 98 F (36.7 C)     Temp Source Oral     SpO2 96 %     Weight (!) 370 lb (167.8 kg)     Height 5\' 11"  (1.803 m)     Head Circumference      Peak Flow      Pain Score 5    Constitutional: Alert and oriented. Well appearing and in no distress. Eyes: Conjunctivae are normal.  ENT   Head: Normocephalic and atraumatic.   Nose: No congestion/rhinnorhea.   Mouth/Throat: Mucous membranes are moist.   Neck: No stridor. Hematological/Lymphatic/Immunilogical: No cervical lymphadenopathy. Cardiovascular: Normal rate, regular rhythm.  No murmurs, rubs, or gallops. Respiratory: Normal respiratory effort without tachypnea nor retractions. Breath sounds are clear and equal bilaterally. No wheezes/rales/rhonchi. Gastrointestinal: Soft and non tender. No rebound. No guarding.  Genitourinary: Deferred Musculoskeletal: s/p right BKA. Left lower leg with mild amount of swelling.  Neurologic:  Normal speech and language. No gross focal neurologic deficits are appreciated.  Skin:  Skin ulcer noted to left lower leg. No surrounding erythema.  Psychiatric: Mood and affect are normal. Speech and behavior are normal. Patient exhibits appropriate insight and judgment.  ____________________________________________    LABS (pertinent positives/negatives)  Glucose 293->185 CBC WBC: 6.0 Hgb: 13.3 Plt: 166 Na 133 Cr 0.81 UA without WBCs, Leukocytes  ____________________________________________   EKG  None  ____________________________________________     RADIOLOGY  None  ____________________________________________   PROCEDURES  Procedures  ____________________________________________   INITIAL IMPRESSION / ASSESSMENT AND PLAN / ED COURSE  Pertinent labs & imaging results that were available during my care of the patient were reviewed by me and considered in my medical decision making (see chart for details).  patient sent here by social work because of concerns for diabetic ketoacidosis or possible infection. Patient's wound does not show any signs of secondary infection. No surrounding erythema. Patient without leukocytosis. Additionally patient without any anion gap arguing against DKA. His glucose did improve without any intervention.  ____________________________________________   FINAL CLINICAL IMPRESSION(S) / ED DIAGNOSES  Final diagnoses:  Hyperglycemia     Note: This dictation was prepared with Dragon dictation. Any transcriptional errors that result from this process are unintentional     Nance Pear, MD 11/20/16 1616
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
## 2016-11-20 NOTE — ED Notes (Signed)
CGB 185

## 2016-11-20 NOTE — Clinical Social Work Note (Signed)
CSW is aware of this patient and will enter an assessment when able. The patient is homeless with APS following. The patient is not able to go to the shelter due to his medical needs.   Santiago Bumpers, MSW, Latanya Presser [phone]

## 2016-11-20 NOTE — Discharge Instructions (Signed)
Please seek medical attention for any high fevers, chest pain, shortness of breath, change in behavior, persistent vomiting, bloody stool or any other new or concerning symptoms.  

## 2016-11-25 ENCOUNTER — Encounter: Payer: Self-pay | Admitting: Physical Therapy

## 2016-11-25 ENCOUNTER — Ambulatory Visit: Payer: Medicare Other | Attending: Family Medicine | Admitting: Physical Therapy

## 2016-11-25 DIAGNOSIS — M6281 Muscle weakness (generalized): Secondary | ICD-10-CM

## 2016-11-25 NOTE — Therapy (Signed)
[place] MAIN [facility] SERVICES [address] [place], [place], [address] Phone: [phone]   Fax:  [phone]  Physical Therapy Evaluation  Patient Details  Name: Russell [name] MRN: [id number] Date of Birth: 05/24/1965 No Data Recorded  Encounter Date: 11/25/2016      PT End of Session - 11/25/16 1329    Visit Number 1   Number of Visits 1   Date for PT Re-Evaluation 11/25/16   PT Start Time 0120   PT Stop Time 0240   PT Time Calculation (min) 80 min      Past Medical History:  Diagnosis Date  . CHF (congestive heart failure) ([place])   . Depression   . Diabetes mellitus without complication ([place])   . Hyperlipemia   . Hypertension     Past Surgical History:  Procedure Laterality Date  . BELOW KNEE LEG AMPUTATION     Right leg    There were no vitals filed for this visit.       Subjective Assessment - 11/25/16 1324    Subjective Patient would like a new powerchair. This one is warn out. The joystick doesnt work, the tires are not working.    Currently in Pain? Yes   Pain Score 8    Pain Location Hand   Pain Orientation Right;Left   Pain Descriptors / Indicators Shooting   Pain Type Chronic pain;Neuropathic pain   Pain Onset More than a month ago   Pain Frequency Constant   Pain Relieving Factors medicine   Effect of Pain on Daily Activities unable to work, cant feet temperature   Multiple Pain Sites --  8/10 pain BLE       PATIENT INFORMATION: This Evaluation form will serve as the LMN for the following suppliers:  Supplier:[employer] Contact Person: Roe Rutherford Phone:[phone]   Reason for Referral:new power chair Patient/caregiver Goals:to be independent in his living environment Patient was seen for face-to-face evaluation for new power wheelchair.  Also present was Roe Rutherford                   to discuss recommendations and wheelchair options.  Further paperwork was completed and sent to vendor.  Patient  appears to qualify for power mobility device at this time per objective findings.   MEDICAL HISTORY: Diagnosis: diabetes, R BKA, peripheral neuropathy,  Primary Diagnosis Onset; BKA:2012, DM 1966,peripheral neuropathy 1966 '[x]' Progressive Disease Relevant Past and Future Surgeries: Height:5 foot 11 inches Weight: 361 lbs Explain and recent changes or trends in weight:   Relevant History including falls: no falls in the last 6 months     HOME ENVIRONMENT: '[x]' House  '[]' Condo/town home  '[x]' Apartment  '[]' Assisted Living    '[]' Lives Alone '[x]'  Lives with Others                                                    Hours with caregiver:   '[x]' Home is accessible to patient            Stairs  '[]' Yes '[]'  No     Ramp '[]' Yes '[x]' No Comments:  Looking for apartment handicapped    COMMUNITY ADL: TRANSPORTATION: '[]' Car    '[]' Van    '[x]' Public Transportation    '[]' Adapted w/c Lift   '[]' Ambulance   '[]' Other:       '[]' Sits in wheelchair during  transport  Employment/School:     Specific requirements pertaining to mobility                                                     Other:                                       FUNCTIONAL/SENSORY PROCESSING SKILLS:  Handedness:   '[x]' Right     '[]' Left    '[]' NA  Comments:                                 Functional Processing Skills for Wheeled Mobility '[x]' Processing Skills are adequate for safe wheelchair operation  Areas of concern than may interfere with safe operation of wheelchair Description of problem   '[]'  Attention to environment     '[]' Judgment     '[]'  Hearing  '[]'  Vision or visual processing    '[]' Motor Planning  '[]'  Fluctuations in Behavior                                                   VERBAL COMMUNICATION: '[x]' WFL receptive '[x]'  WFL expressive '[x]' Understandable  '[]' Difficult to understand  '[]' non-communicative '[]'  Uses an augmented communication device    CURRENT SEATING / MOBILITY: Current Mobility Base: jazzy 614 HD  '[]' None  '[]' Dependent   '[]' Manual  '[]' Scooter  '[x]' Power   Type of Control:     porpotional                  Manufacturer:    [employer]ide                     Size:     22 x20                    Age:     5 years +                      Current Condition of Mobility Base:     needs replacement                                                                                                              Current Wheelchair components:  Needs replacement  Describe posture in present seating system: posterior pelvic tilt                                                                           SENSATION and SKIN ISSUES: Sensation '[]' Intact '[x]' Impaired '[]' Absent   Level of sensation: decreased sensation UE middle of forearm to wrist; and LE  : mid cal to toes numbness and not able to determine temperature changes                         Pressure Relief: Able to perform effective pressure relief :   '[x]' Yes  '[]'  No Method:  Leaning side to side                                                                            If not, Why?:                                                                          Skin Issues/Skin Integrity Current Skin Issues   '[x]' Yes '[]' No LLE  '[]' Intact '[x]'  Red area '[x]'  Open Area  '[x]' Scar Tissue '[]' At risk from prolonged sitting  Where RLE shin  Anterior and medial                             History of Skin Issues   '[x]' Yes '[]' No  Where  Left leg                                       When    Beginning 4/18                                           Hx of skin flap surgeries '[]' Yes '[x]' No  Where                  When                                                  Limited sitting tolerance '[x]' Yes '[]' No Hours spent sitting in wheelchair daily:                       16 hours  Complaint of Pain:  Please describe: BUE hand and LLE feet pain                                                                                                              Swelling/Edema:    LLE swelling , RLE stump swollen                                                                                                                                              ADL STATUS (in reference to wheelchair use):  Indep Assist Unable Indep with Equip Not assessed Comments  Dressing         x                                                                Eating         x                                                                                                                     Toileting        x  Bathing        x                                                                                                                              Grooming/ Hygiene         x                                                                                                                     Meal Prep          x                                                                                                                IADLS        x                                                                                                          Bowel Management: '[x]' Continent  '[]' Incontinent  '[]' Accidents Comments:                                                  Bladder Management: '[x]' Continent  '[]' Incontinent  '[]' Accidents Comments:  WHEELCHAIR SKILLS: Manual w/c Propulsion: Unable to propel due to his weight '[x]' UE or LE strength and endurance sufficient to participate in ADLs using manual wheelchair 34 lbs R grip 22 lbs L grip  Strength is 4/5 BUE Arm :  '[x]' left '[x]' right  '[x]' Both                                   Foot:   '[x]' left '[]' right  '[]' Both  Distance:   Operate Scooter: '[x]'  Strength, hand grip, balance and transfer  appropriate for use '[]' Living environment is accessible for use of scooter  Operate Power w/c:  '[x]'  Std. Joystick   '[]'  Alternative Controls Indep '[x]'  Assist '[]'  Dependent/ Unable '[]'  N/A '[]'  '[]' Safe          '[]'  Functional      Distance:                Bed confined without wheelchair '[x]'  Yes '[]'  No   STRENGTH/RANGE OF MOTION:  Range of Motion Strength  Shoulder             WNL                                                                                              4/5  Elbow                 WNL                                                                                             4/5     Wrist/Hand                                                               WNL                                                                  4/5           Hip            WNL                                                                                                                 -  3/5 LLE, RLE 5/5   Knee                                                             WNL 4/5  LLE  Ankle   WNL LLE                                                             2/5     LLE      MOBILITY/BALANCE:  '[]'  Patient is totally dependent for mobility                                                                                               Balance Transfers Ambulation  Sitting Balance: Standing Balance: '[x]'  Independent '[]'  Independent/Modified Independent  '[x]'  WFL     '[]'  WFL '[]'  Supervision '[]'  Supervision  '[]'  Uses UE for balance  '[]'  Supervision '[]'  Min Assist '[]'  Ambulates with Assist                           '[]'  Min Assist '[]'  Min assist '[]'  Mod Assist '[]'  Ambulates with Device:  '[]'  RW   '[]'  StW   '[]'  Cane   '[]'                 '[]'  Mod Assist '[]'  Mod assist '[]'  Max assist   '[]'  Max Assist '[]'  Max assist '[]'  Dependent '[]'  Indep. Short Distance Only  '[]'  Unable '[x]'  Unable '[]'  Lift / Sling Required Distance (in feet)                             '[]'  Sliding board '[x]'  Unable to Ambulate: (Explain:Patient is not able to wear  his RLE prosthesis due to swelling and open sores and has not walked in 6 weeks.   Cardio Status:  '[]' Intact  '[x]'  Impaired   '[]'  NA    Heart failure      2004                     Respiratory Status:  '[x]' Intact   '[]' Impaired   '[]' NA       Sleep apnea                              Orthotics/Prosthetics:   RLE BK prosthetics 4 years , but now having skin breakdown and unable to wear it  Comments (Address manual vs power w/c vs scooter):   Patient is obese 361 lbs and unable to efficienty propel manual WC, home is not adequate for scooter.                                                           Anterior / Posterior Obliquity Rotation-Pelvis  PELVIS    '[x]' Neutral  '[]'  Posterior  '[]'  Anterior     '[x]' WFL  '[]' Right Elevated  '[]' Left Elevated   '[x]' WFL  '[]' Right Anterior '[]'   Left Anterior    '[]'  Fixed '[]'  Partly Flexible '[]'  Flexible  '[]'  Other  '[]'  Fixed  '[]'  Partly Flexible  '[]'  Flexible '[]'  Other  '[]'  Fixed  '[]'  Partly Flexible  '[]'  Flexible '[]'  Other  TRUNK '[x]' WFL '[]' Thoracic Kyphosis '[]' Lumbar Lordosis   '[x]'  WFL '[]' Convex Right '[]' Convex Left   '[]' c-curve '[]' s-curve '[]' multiple  '[]'  Neutral '[]'  Left-anterior '[]'  Right-anterior    '[]'  Fixed '[]'  Flexible '[]'  Partly Flexible       Other  '[]'  Fixed '[]'  Flexible '[]'  Partly Flexible '[]'  Other  '[]'  Fixed           '[]'  Flexible '[]'  Partly Flexible '[]'  Other   Position Windswept   HIPS  '[x]'  Neutral '[]'  Abduct '[]'  ADduct '[x]'  Neutral '[]'  Right '[]'  Left       '[]'  Fixed  '[]'  Partly Flexible             '[]'  Dislocated '[]'  Flexible '[]'  Subluxed    '[]'  Fixed '[]'  Partly Flexible  '[]'  Flexible '[]'  Other              Foot Positioning Knee Positioning   Knees and  Feet  '[x]'  WFL '[x]' Left '[]' Right amputation '[x]'  WFL '[x]' Left '[x]' Right   KNEES ROM concerns: ROM concerns:   & Dorsi-Flexed                    '[]' Lt '[]' Rt                                  FEET Plantar Flexed                   '[]' Lt '[]' Rt     Inversion                    '[]' Lt '[]' Rt     Eversion                    '[]' Lt '[]' Rt    HEAD '[]'  Functional '[x]'  Good Head Control   & '[]'  Flexed         '[]'  Extended '[]'  Adequate Head Control   NECK '[]'  Rotated  Lt  '[]'  Lat Flexed Lt '[]'  Rotated  Rt '[]'  Lat Flexed Rt '[]'  Limited Head Control    '[]'  Cervical Hyperextension '[]'  Absent  Head Control    SHOULDERS ELBOWS WRIST& HAND         Left     Right    Left     Right  U/E '[x]' Functional  Left            '[x]' Functional  Right                                 '[]'   Fisting             '[]' Fisting     '[]' elevated Left '[]' depressed  Left '[]' elevated Right '[]' depressed  Right      '[]' protracted Left '[]' retracted Left '[]' protracted Right '[]' retracted Right '[]' subluxed  Left              '[]' subluxed  Right         Goals for Wheelchair Mobility  '[x]'  Independence with mobility in the home with motor related ADLs (MRADLs)  '[x]'  Independence with MRADLs in the community '[]'  Provide dependent mobility  '[]'  Provide recline     '[]' Provide tilt   Goals for Seating system '[x]'  Optimize pressure distribution '[x]'  Provide support needed to facilitate function or safety '[]'  Provide corrective forces to assist with maintaining or improving posture '[]'  Accommodate client's posture: current seated postures and positions are not flexible or will not tolerate corrective forces '[x]'  Client to be independent with relieving pressure in the wheelchair '[]' Enhance physiological function such as breathing, swallowing, digestion  Simulation ideas/Equipment trials:                                                                                                State why other equipment was unsuccessful:                                                                                 MOBILITY BASE RECOMMENDATIONS and JUSTIFICATION: MOBILITY COMPONENT JUSTIFICATION  Manufacturer:           Model:              Size: Width    22       Seat Depth    20         '[x]' provide  transport from point A to B '[]' promote Indep mobility  '[x]' is not a safe, functional ambulator '[x]' walker or cane inadequate '[]' non-standard width/depth necessary to accommodate anatomical measurement '[]'                             '[]' Manual Mobility Base '[]' non-functional ambulator    '[]' Scooter/POV  '[]' can safely operate  '[]' can safely transfer   '[]' has adequate trunk stability  '[]' cannot functionally propel manual w/c  '[x]' Power Mobility Base  '[x]' non-ambulatory  '[x]' cannot functionally propel manual wheelchair  '[x]'  cannot functionally and safely operate scooter/POV '[]' can safely operate and willing to  '[]' Stroller Base '[]' infant/child  '[]' unable to propel manual wheelchair '[]' allows for growth '[]' non-functional ambulator '[]' non-functional UE '[]' Indep mobility is not a goal at this time  '[]' Tilt  '[]' Forward                   '[]' Backward                  '[]' Powered tilt              '[]'   Manual tilt  '[]' change position against gravitational force on head and shoulders  '[]' change position for pressure relief/cannot weight shift '[]' transfers  '[]' management of tone '[]' rest periods '[]' control edema '[]' facilitate postural control  '[]'                                       '[]' Recline  '[]' Power recline on power base '[]' Manual recline on manual base  '[]' accommodate femur to back angle  '[]' bring to full recline for ADL care  '[]' change position for pressure relief/cannot weight shift '[]' rest periods '[]' repositioning for transfers or clothing/diaper /catheter changes '[]' head positioning  '[]' Lighter weight required '[]' self- propulsion  '[]' lifting '[]'                                                 '[x]' Heavy Duty required '[x]' user weight greater than 250# '[]' extreme tone/ over active movement '[x]' broken frame on previous chair '[]'                                     '[]'  Back  '[]'  Angle Adjustable '[]'  Custom molded                           '[]' postural control '[]' control of tone/spasticity '[]' accommodation of range of motion '[]' UE functional  control '[]' accommodation for seating system '[]'                                          '[]' provide lateral trunk support '[]' accommodate deformity '[]' provide posterior trunk support '[]' provide lumbar/sacral support '[]' support trunk in midline '[]' Pressure relief over spinal processes  '[]'  Seat Cushion                       '[]' impaired sensation  '[]' decubitus ulcers present '[]' history of pressure ulceration '[]' prevent pelvic extension '[]' low maintenance  '[]' stabilize pelvis  '[]' accommodate obliquity '[]' accommodate multiple deformity '[]' neutralize lower extremity position '[]' increase pressure distribution '[]'                                           '[]'  Pelvic/thigh support  '[]'  Lateral thigh guide '[]'  Distal medial pad  '[]'  Distal lateral pad '[]'  pelvis in neutral '[]' accommodate pelvis '[]'  position upper legs '[]'  alignment '[]'  accommodate ROM '[]'  decrease adduction '[]' accommodate tone '[]' removable for transfers '[]' decrease abduction  '[]'  Lateral trunk Supports '[]'  Lt     '[]'  Rt '[]' decrease lateral trunk leaning '[]' control tone '[]' contour for increased contact '[]' safety  '[]' accommodate asymmetry '[]'                                                 '[]'  Mounting hardware  '[]' lateral trunk supports  '[]' back   '[]' seat '[]' headrest      '[]'  thigh support '[]' fixed   '[]' swing away '[]' attach seat platform/cushion to w/c frame '[]' attach back cushion to w/c frame '[]' mount postural supports '[]' mount  headrest  '[]' swing medial thigh support away '[]' swing lateral supports away for transfers  '[]'                                                     Armrests  '[]' fixed '[x]' adjustable height '[]' removable   '[]' swing away  '[x]' flip back   '[]' reclining '[]' full length pads '[]' desk    '[]' pads tubular  '[x]' provide support with elbow at 90   '[]' provide support for w/c tray '[]' change of height/angles for variable activities '[x]' remove for transfers '[]' allow to come closer to table top '[]' remove for access to tables '[]'                                                Hangers/ Leg rests  '[]' 60 '[]' 70 '[]' 90 '[]' elevating '[]' heavy duty  '[]' articulating '[]' fixed '[]' lift off '[]' swing away     '[]' power '[]' provide LE support  '[]' accommodate to hamstring tightness '[]' elevate legs during recline   '[]' provide change in position for Legs '[]' Maintain placement of feet on footplate '[]' durability '[]' enable transfers '[]' decrease edema '[]' Accommodate lower leg length '[]'                                         Foot support Footplate    '[]' Lt  '[]'  Rt  '[]'  Center mount '[]' flip up                            '[]' depth/angle adjustable '[]' Amputee adapter    '[]'  Lt     '[]'  Rt '[]' provide foot support '[]' accommodate to ankle ROM '[]' transfers '[]' Provide support for residual extremity '[]'  allow foot to go under wheelchair base '[]'  decrease tone  '[]'                                                 '[]'  Ankle strap/heel loops '[]' support foot on foot support '[]' decrease extraneous movement '[]' provide input to heel  '[]' protect foot  Tires: '[]' pneumatic  '[]' flat free inserts  '[]' solid  '[]' decrease maintenance  '[]' prevent frequent flats '[]' increase shock absorbency '[]' decrease pain from road shock '[]' decrease spasms from road shock '[]'                                              '[]'  Headrest  '[]' provide posterior head support '[]' provide posterior neck support '[]' provide lateral head support '[]' provide anterior head support '[]' support during tilt and recline '[]' improve feeding   '[]' improve respiration '[]' placement of switches '[]' safety  '[]' accommodate ROM  '[]' accommodate tone '[]' improve visual orientation  '[]'  Anterior chest strap '[]'  Vest '[]'  Shoulder retractors  '[]' decrease forward movement of shoulder '[]' accommodation of TLSO '[]' decrease forward movement of trunk '[]' decrease shoulder elevation '[]' added abdominal support '[]' alignment '[]' assistance with shoulder control  '[]'   Pelvic Positioner '[]' Belt '[]' SubASIS bar '[]' Dual Pull '[]' stabilize tone '[]' decrease falling out of chair/ **will not  Decrease potential for sliding due to pelvic tilting '[]' prevent excessive rotation '[]' pad for protection over boney prominence '[]' prominence comfort '[]' special pull angle to control rotation '[]'                                                  Upper ExtremitySupport  '[]' L   '[]'  R '[]' Arm trough   '[]' hand support '[]'  tray       '[]' full tray '[]' swivel mount '[]' decrease edema      '[]' decrease subluxation   '[]' control tone   '[]' placement for AAC/Computer/EADL '[]' decrease gravitational pull on shoulders '[]' provide midline positioning '[]' provide support to increase UE function '[]' provide hand support in natural position '[]' provide work surface   POWER WHEELCHAIR CONTROLS  '[]' Proportional  '[]' Non-Proportional Type                                      '[]' Left  '[]' Right '[]' provides access for controlling wheelchair   '[]' lacks motor control to operate proportional drive control '[]' unable to understand proportional controls  Actuator Control Module  '[]' Single  '[]' Multiple   '[]' Allow the client to operate the power seat function(s) through the joystick control   '[]' Safety Reset Switches '[]' Used to change modes and stop the wheelchair when driving in latch mode    '[]' Upgraded Electronics   '[]' programming for accurate control '[]' progressive Disease/changing condition '[]' non-proportional drive control needed '[]' Needed in order to operate power seat functions through joystick control   '[]' Display box '[]' Allows user to see in which mode and drive the wheelchair is set  '[]' necessary for alternate controls    '[]' Digital interface electronics '[]' Allows w/c to operate when using alternative drive controls  '[]' ASL Head Array '[]' Allows client to operate wheelchair  through switches placed in tri-panel headrest  '[]' Sip and puff with tubing kit '[]' needed to operate sip and puff drive controls  '[]' Upgraded tracking electronics '[]' increase safety when driving '[]' correct tracking when on uneven surfaces  '[]' Mount for switches or joystick '[]' Attaches  switches to w/c  '[]' Swing away for access or transfers '[]' midline for optimal placement '[]' provides for consistent access  '[]' Attendant controlled joystick plus mount '[]' safety '[]' long distance driving '[]' operation of seat functions '[]' compliance with transportation regulations '[]'                                             Rear wheel placement/Axle adjustability '[]' None '[]' semi adjustable '[]' fully adjustable  '[]' improved UE access to wheels '[]' improved stability '[]' changing angle in space for improvement of postural stability '[]' 1-arm drive access '[]' amputee pad placement '[]'                                Wheel rims/ hand rims  '[]' metal   '[]' plastic coated '[]' oblique projections           '[]' vertical projections '[]' Provide ability to propel manual wheelchair  '[]'  Increase self-propulsion with hand weakness/decreased grasp  Push handles '[]' extended   '[]' angle adjustable              '[]' standard '[]' caregiver access '[]' caregiver assist '[]' allows "hooking" to enable increased  ability to perform ADLs or maintain balance  One armed device   '[]' Lt   '[]' Rt '[]' enable propulsion of manual wheelchair with one arm   '[]'                                            Brake/wheel lock extension '[]'  Lt   '[]'  Rt '[]' increase indep in applying wheel locks   '[]' Side guards '[]' prevent clothing getting caught in wheel or becoming soiled '[]'  prevent skin tears/abrasions  Battery:                                            '[x]' to power wheelchair                                                         Other:                                                                                                                        The above equipment has a life- long use expectancy. Growth and changes in medical and/or functional conditions would be the exceptions. This is to certify that the therapist has no financial relationship with durable medical provider or manufacturer. The therapist will not receive remuneration of any kind for the equipment  recommended in this evaluation.   Patient has mobility limitation that significantly impairs safe, timely participation in one or more mobility related ADL's. (bathing, toileting, feeding, dressing, grooming, moving from room to room)  '[x]'  Yes '[]'  No  Will mobility device sufficiently improve ability to participate and/or be aided in participation of MRADL's?      '[x]'  Yes '[]'  No  Can limitation be compensated for with use of a cane or walker?                                    '[]'  Yes '[x]'  No  Does patient or caregiver demonstrate ability/potential ability & willingness to safely use the mobility device?    '[x]'  Yes '[]'  No  Does patient's home environment support use of recommended mobility device?            '[x]'  Yes '[]'  No  Does patient have sufficient upper extremity function necessary to functionally propel a manual wheelchair?     '[]'  Yes '[x]'  No  Does patient have sufficient strength and trunk stability to safely operate a POV (scooter)?                                  '[]'   Yes '[x]'  No  Does patient need additional features/benefits provided by a power wheelchair for MRADL's in the home?        '[]'  Yes '[x]'  No  Does the patient demonstrate the ability to safely use a power wheelchair?                   '[x]'  Yes '[]'  No     Physician's Name Printed:                                                        76 Signature:  Date:     This is to certify that I, the above signed therapist have the following affiliations: '[]'  This DME provider  '[]'  Manufacturer of recommended equipment '[]'  Patient's long term care facility '[x]'  None of the above  Therapist Name/Signature:     Arelia Sneddon PT DPT                                        Date:  11/25/16             Objective measurements completed on examination: See above findings.                       PT Long Term Goals - 11/25/16 1330      PT LONG TERM GOAL #1   Title Pt and caregivers will understand  PT recommendation and appropriate/safe use for wheelchair and seating for home use.   Baseline patient understands    Time 1   Period Days   Status Achieved   Target Date 11/25/16                Plan - 11/25/16 1429    Clinical Impression Statement Patient presents for evaluation for a  new power wc. He is not able to ambulate due to not being able to don his RLE prosthesis secondary to open sores on his stump that are healing slowly. He transfers independently from power wc to toilet and to bed. He is obese and unable to maneuver a manual wc for ADL's. He has decreased strength in LLE hip and ankle and decreased sensation LLE ankle and foot and BUE hand due to neuropathy. He will benefit from a new power chair to be able to be independent in his home for ADL's.    Clinical Presentation Stable   Clinical Decision Making Low   PT Frequency One time visit   Consulted and Agree with Plan of Care Patient;Other (Comment)  nu motion representative erik mason      Patient will benefit from skilled therapeutic intervention in order to improve the following deficits and impairments:     Visit Diagnosis: Muscle weakness (generalized)     Problem List Patient Active Problem List   Diagnosis Date Noted  . Cellulitis and abscess of left leg 08/08/2016  . 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 Hyperglycemia 08/08/2016  . Morbid obesity ([place]) 08/08/2016  . Cellulitis and abscess of leg 08/08/2016  . Adjustment disorder with depressed mood 11/21/2014  . Other social stressor 11/21/2014  . 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 Diabetes ([place]) 11/21/2014    Amour Trigg, [place],  PT  DPT 11/25/2016, 2:35 PM  [place] [facility] [address] [place], [place], [address] Phone: [phone]   Fax:  [phone]  Name: Russell [name] MRN: [id number] Date of Birth: 02-Mar-1965

## 2017-01-10 DIAGNOSIS — E782 Mixed hyperlipidemia: Secondary | ICD-10-CM | POA: Diagnosis present

## 2017-05-18 ENCOUNTER — Other Ambulatory Visit: Payer: Self-pay

## 2017-05-18 ENCOUNTER — Encounter: Payer: Self-pay | Admitting: Internal Medicine

## 2017-05-18 ENCOUNTER — Inpatient Hospital Stay
Admission: EM | Admit: 2017-05-18 | Discharge: 2017-05-20 | DRG: 602 | Disposition: A | Discharge status: Home / Self Care | Payer: Medicare Other | Attending: Internal Medicine | Admitting: Internal Medicine

## 2017-05-18 ENCOUNTER — Emergency Department: Payer: Medicare Other

## 2017-05-18 DIAGNOSIS — Z993 Dependence on wheelchair: Secondary | ICD-10-CM

## 2017-05-18 DIAGNOSIS — Z6841 Body Mass Index (BMI) 40.0 and over, adult: Secondary | ICD-10-CM | POA: Diagnosis not present

## 2017-05-18 DIAGNOSIS — J440 Chronic obstructive pulmonary disease with acute lower respiratory infection: Secondary | ICD-10-CM | POA: Diagnosis present

## 2017-05-18 DIAGNOSIS — J209 Acute bronchitis, unspecified: Secondary | ICD-10-CM | POA: Diagnosis present

## 2017-05-18 DIAGNOSIS — Z7982 Long term (current) use of aspirin: Secondary | ICD-10-CM

## 2017-05-18 DIAGNOSIS — Z794 Long term (current) use of insulin: Secondary | ICD-10-CM

## 2017-05-18 DIAGNOSIS — E1142 Type 2 diabetes mellitus with diabetic polyneuropathy: Secondary | ICD-10-CM | POA: Diagnosis present

## 2017-05-18 DIAGNOSIS — Z7901 Long term (current) use of anticoagulants: Secondary | ICD-10-CM

## 2017-05-18 DIAGNOSIS — E11628 Type 2 diabetes mellitus with other skin complications: Secondary | ICD-10-CM | POA: Diagnosis present

## 2017-05-18 DIAGNOSIS — F329 Major depressive disorder, single episode, unspecified: Secondary | ICD-10-CM | POA: Diagnosis present

## 2017-05-18 DIAGNOSIS — I5043 Acute on chronic combined systolic (congestive) and diastolic (congestive) heart failure: Secondary | ICD-10-CM | POA: Diagnosis present

## 2017-05-18 DIAGNOSIS — L0390 Cellulitis, unspecified: Secondary | ICD-10-CM | POA: Diagnosis present

## 2017-05-18 DIAGNOSIS — E1165 Type 2 diabetes mellitus with hyperglycemia: Secondary | ICD-10-CM | POA: Diagnosis present

## 2017-05-18 DIAGNOSIS — I872 Venous insufficiency (chronic) (peripheral): Secondary | ICD-10-CM | POA: Diagnosis present

## 2017-05-18 DIAGNOSIS — Z608 Other problems related to social environment: Secondary | ICD-10-CM | POA: Diagnosis present

## 2017-05-18 DIAGNOSIS — Z9111 Patient's noncompliance with dietary regimen: Secondary | ICD-10-CM

## 2017-05-18 DIAGNOSIS — Z89511 Acquired absence of right leg below knee: Secondary | ICD-10-CM | POA: Diagnosis not present

## 2017-05-18 DIAGNOSIS — I110 Hypertensive heart disease with heart failure: Secondary | ICD-10-CM | POA: Diagnosis present

## 2017-05-18 DIAGNOSIS — Z79899 Other long term (current) drug therapy: Secondary | ICD-10-CM | POA: Diagnosis not present

## 2017-05-18 DIAGNOSIS — T383X6A Underdosing of insulin and oral hypoglycemic [antidiabetic] drugs, initial encounter: Secondary | ICD-10-CM | POA: Diagnosis present

## 2017-05-18 DIAGNOSIS — I480 Paroxysmal atrial fibrillation: Secondary | ICD-10-CM | POA: Diagnosis present

## 2017-05-18 DIAGNOSIS — E785 Hyperlipidemia, unspecified: Secondary | ICD-10-CM | POA: Diagnosis present

## 2017-05-18 DIAGNOSIS — L97509 Non-pressure chronic ulcer of other part of unspecified foot with unspecified severity: Secondary | ICD-10-CM | POA: Diagnosis present

## 2017-05-18 DIAGNOSIS — J40 Bronchitis, not specified as acute or chronic: Secondary | ICD-10-CM

## 2017-05-18 DIAGNOSIS — Z91013 Allergy to seafood: Secondary | ICD-10-CM

## 2017-05-18 DIAGNOSIS — L03116 Cellulitis of left lower limb: Principal | ICD-10-CM | POA: Diagnosis present

## 2017-05-18 DIAGNOSIS — E11621 Type 2 diabetes mellitus with foot ulcer: Secondary | ICD-10-CM | POA: Diagnosis present

## 2017-05-18 LAB — COMPREHENSIVE METABOLIC PANEL
ALT: 18 U/L (ref 17–63)
AST: 24 U/L (ref 15–41)
Albumin: 3.8 g/dL (ref 3.5–5.0)
Alkaline Phosphatase: 119 U/L (ref 38–126)
Anion gap: 11 (ref 5–15)
BILIRUBIN TOTAL: 0.6 mg/dL (ref 0.3–1.2)
BUN: 25 mg/dL — AB (ref 6–20)
CO2: 25 mmol/L (ref 22–32)
CREATININE: 0.93 mg/dL (ref 0.61–1.24)
Calcium: 9.1 mg/dL (ref 8.9–10.3)
Chloride: 99 mmol/L — ABNORMAL LOW (ref 101–111)
GFR calc Af Amer: >60 mL/min (ref >60)
Glucose, Bld: 422 mg/dL — ABNORMAL HIGH (ref 65–99)
Potassium: 4.4 mmol/L (ref 3.5–5.1)
Sodium: 135 mmol/L (ref 135–145)
Total Protein: 7.8 g/dL (ref 6.5–8.1)

## 2017-05-18 LAB — GLUCOSE, CAPILLARY
GLUCOSE-CAPILLARY: 564 mg/dL — AB (ref 65–99)
Glucose-Capillary: >600 mg/dL (ref 65–99)

## 2017-05-18 LAB — BRAIN NATRIURETIC PEPTIDE: B NATRIURETIC PEPTIDE 5: 15 pg/mL (ref 0.0–100.0)

## 2017-05-18 LAB — CBC WITH DIFFERENTIAL/PLATELET
Basophils Absolute: 0 10*3/uL (ref 0–0.1)
Basophils Relative: 1 %
EOS PCT: 3 %
Eosinophils Absolute: 0.2 10*3/uL (ref 0–0.7)
HCT: 47.1 % (ref 40.0–52.0)
Hemoglobin: 15 g/dL (ref 13.0–18.0)
LYMPHS PCT: 11 %
Lymphs Abs: 0.9 10*3/uL — ABNORMAL LOW (ref 1.0–3.6)
MCH: 25.7 pg — AB (ref 26.0–34.0)
MCHC: 32 g/dL (ref 32.0–36.0)
MCV: 80.4 fL (ref 80.0–100.0)
MONO ABS: 0.5 10*3/uL (ref 0.2–1.0)
Monocytes Relative: 6 %
Neutro Abs: 6.6 10*3/uL — ABNORMAL HIGH (ref 1.4–6.5)
Neutrophils Relative %: 79 %
PLATELETS: 200 10*3/uL (ref 150–440)
RBC: 5.85 MIL/uL (ref 4.40–5.90)
RDW: 14.5 % (ref 11.5–14.5)
WBC: 8.3 10*3/uL (ref 3.8–10.6)

## 2017-05-18 LAB — HEMOGLOBIN A1C
HEMOGLOBIN A1C: 9.6 % — AB (ref 4.8–5.6)
Mean Plasma Glucose: 228.82 mg/dL

## 2017-05-18 LAB — PROCALCITONIN: Procalcitonin: <0.1 ng/mL

## 2017-05-18 LAB — TROPONIN I

## 2017-05-18 LAB — LACTIC ACID, PLASMA: Lactic Acid, Venous: 2 mmol/L (ref 0.5–1.9)

## 2017-05-18 MED ORDER — VENLAFAXINE HCL ER 75 MG PO CP24
150.0000 mg | ORAL_CAPSULE | Freq: Two times a day (BID) | ORAL | Status: DC
  Filled 2017-05-18: qty 2

## 2017-05-18 MED ORDER — INSULIN ASPART 100 UNIT/ML ~~LOC~~ SOLN: 0.0000 [IU] | Freq: Three times a day (TID) | SUBCUTANEOUS | Status: DC

## 2017-05-18 MED ORDER — ONDANSETRON HCL 4 MG PO TABS: 4.0000 mg | ORAL_TABLET | Freq: Four times a day (QID) | ORAL | Status: DC | PRN

## 2017-05-18 MED ORDER — METHYLPREDNISOLONE SODIUM SUCC 125 MG IJ SOLR
125.0000 mg | Freq: Once | INTRAMUSCULAR | Status: AC
  Administered 2017-05-18: 125 mg via INTRAVENOUS
  Filled 2017-05-18: qty 2

## 2017-05-18 MED ORDER — IPRATROPIUM-ALBUTEROL 0.5-2.5 (3) MG/3ML IN SOLN
3.0000 mL | Freq: Once | RESPIRATORY_TRACT | Status: AC
  Administered 2017-05-18: 3 mL via RESPIRATORY_TRACT
  Filled 2017-05-18: qty 3

## 2017-05-18 MED ORDER — INSULIN ASPART 100 UNIT/ML ~~LOC~~ SOLN: 0.0000 [IU] | Freq: Every day | SUBCUTANEOUS | Status: DC

## 2017-05-18 MED ORDER — VANCOMYCIN HCL 10 G IV SOLR
1250.0000 mg | Freq: Three times a day (TID) | INTRAVENOUS | Status: DC
  Administered 2017-05-19: 1250 mg via INTRAVENOUS
  Filled 2017-05-18 (×3): qty 1250

## 2017-05-18 MED ORDER — MORPHINE SULFATE (PF) 2 MG/ML IV SOLN: 2.0000 mg | INTRAVENOUS | Status: DC | PRN

## 2017-05-18 MED ORDER — FUROSEMIDE 10 MG/ML IJ SOLN
40.0000 mg | Freq: Two times a day (BID) | INTRAMUSCULAR | Status: DC
  Administered 2017-05-18 – 2017-05-20 (×4): 40 mg via INTRAVENOUS
  Filled 2017-05-18 (×4): qty 4

## 2017-05-18 MED ORDER — PREGABALIN 50 MG PO CAPS
100.0000 mg | ORAL_CAPSULE | Freq: Three times a day (TID) | ORAL | Status: DC
  Administered 2017-05-18 – 2017-05-20 (×5): 100 mg via ORAL
  Filled 2017-05-18 (×5): qty 2

## 2017-05-18 MED ORDER — FINASTERIDE 5 MG PO TABS
5.0000 mg | ORAL_TABLET | Freq: Every day | ORAL | Status: DC
  Administered 2017-05-18 – 2017-05-20 (×3): 5 mg via ORAL
  Filled 2017-05-18 (×3): qty 1

## 2017-05-18 MED ORDER — ATORVASTATIN CALCIUM 10 MG PO TABS
10.0000 mg | ORAL_TABLET | Freq: Every day | ORAL | Status: DC
  Administered 2017-05-18: 10 mg via ORAL
  Filled 2017-05-18: qty 1

## 2017-05-18 MED ORDER — INSULIN GLARGINE 100 UNIT/ML ~~LOC~~ SOLN
20.0000 [IU] | Freq: Two times a day (BID) | SUBCUTANEOUS | Status: DC
  Administered 2017-05-19 – 2017-05-20 (×3): 20 [IU] via SUBCUTANEOUS
  Filled 2017-05-18 (×5): qty 0.2

## 2017-05-18 MED ORDER — HEPARIN SODIUM (PORCINE) 5000 UNIT/ML IJ SOLN
5000.0000 [IU] | Freq: Three times a day (TID) | INTRAMUSCULAR | Status: DC
  Administered 2017-05-18 – 2017-05-20 (×6): 5000 [IU] via SUBCUTANEOUS
  Filled 2017-05-18 (×6): qty 1

## 2017-05-18 MED ORDER — TAMSULOSIN HCL 0.4 MG PO CAPS
0.4000 mg | ORAL_CAPSULE | Freq: Every day | ORAL | Status: DC
  Administered 2017-05-18 – 2017-05-20 (×3): 0.4 mg via ORAL
  Filled 2017-05-18 (×3): qty 1

## 2017-05-18 MED ORDER — PIPERACILLIN SOD-TAZOBACTAM SO 2.25 (2-0.25) G IV SOLR
Freq: Three times a day (TID) | INTRAVENOUS | Status: DC
  Administered 2017-05-19: via INTRAVENOUS
  Filled 2017-05-18 (×4): qty 4.5

## 2017-05-18 MED ORDER — ACETAMINOPHEN 650 MG RE SUPP: 650.0000 mg | Freq: Four times a day (QID) | RECTAL | Status: DC | PRN

## 2017-05-18 MED ORDER — INSULIN REGULAR HUMAN 100 UNIT/ML IJ SOLN
20.0000 [IU] | Freq: Once | INTRAMUSCULAR | Status: AC
  Administered 2017-05-18: 20 [IU] via INTRAVENOUS
  Filled 2017-05-18: qty 0.2

## 2017-05-18 MED ORDER — ASPIRIN EC 81 MG PO TBEC
81.0000 mg | DELAYED_RELEASE_TABLET | Freq: Every day | ORAL | Status: DC
  Administered 2017-05-19 – 2017-05-20 (×2): 81 mg via ORAL
  Filled 2017-05-18 (×2): qty 1

## 2017-05-18 MED ORDER — HYDROCOD POLST-CPM POLST ER 10-8 MG/5ML PO SUER
5.0000 mL | Freq: Two times a day (BID) | ORAL | Status: DC
  Administered 2017-05-18 – 2017-05-20 (×4): 5 mL via ORAL
  Filled 2017-05-18 (×4): qty 5

## 2017-05-18 MED ORDER — HYDRALAZINE HCL 20 MG/ML IJ SOLN: 10.0000 mg | Freq: Four times a day (QID) | INTRAMUSCULAR | Status: DC | PRN

## 2017-05-18 MED ORDER — INSULIN ASPART 100 UNIT/ML ~~LOC~~ SOLN
5.0000 [IU] | Freq: Three times a day (TID) | SUBCUTANEOUS | Status: DC
  Administered 2017-05-19 (×2): 5 [IU] via SUBCUTANEOUS
  Filled 2017-05-18 (×2): qty 1

## 2017-05-18 MED ORDER — INSULIN ASPART 100 UNIT/ML ~~LOC~~ SOLN
0.0000 [IU] | Freq: Three times a day (TID) | SUBCUTANEOUS | Status: DC
  Administered 2017-05-19: 15 [IU] via SUBCUTANEOUS
  Administered 2017-05-19: 11 [IU] via SUBCUTANEOUS
  Administered 2017-05-19: 20 [IU] via SUBCUTANEOUS
  Administered 2017-05-20 (×2): 11 [IU] via SUBCUTANEOUS
  Filled 2017-05-18 (×5): qty 1

## 2017-05-18 MED ORDER — VANCOMYCIN HCL IN DEXTROSE 1-5 GM/200ML-% IV SOLN
1000.0000 mg | Freq: Once | INTRAVENOUS | Status: AC
  Administered 2017-05-18: 1000 mg via INTRAVENOUS
  Filled 2017-05-18: qty 200

## 2017-05-18 MED ORDER — ONDANSETRON HCL 4 MG/2ML IJ SOLN: 4.0000 mg | Freq: Four times a day (QID) | INTRAMUSCULAR | Status: DC | PRN

## 2017-05-18 MED ORDER — TRAMADOL HCL 50 MG PO TABS: 50.0000 mg | ORAL_TABLET | Freq: Four times a day (QID) | ORAL | Status: DC | PRN

## 2017-05-18 MED ORDER — ACETAMINOPHEN 500 MG PO TABS
1000.0000 mg | ORAL_TABLET | Freq: Once | ORAL | Status: AC
Start: 2017-05-18 — End: 2017-05-18
  Administered 2017-05-18: 1000 mg via ORAL
  Filled 2017-05-18: qty 2

## 2017-05-18 MED ORDER — LOSARTAN POTASSIUM 50 MG PO TABS: 50.0000 mg | ORAL_TABLET | Freq: Every day | ORAL | Status: DC

## 2017-05-18 MED ORDER — CARVEDILOL 6.25 MG PO TABS
3.1250 mg | ORAL_TABLET | Freq: Two times a day (BID) | ORAL | Status: DC
  Administered 2017-05-19: 3.125 mg via ORAL
  Filled 2017-05-18: qty 1

## 2017-05-18 MED ORDER — ACETAMINOPHEN 325 MG PO TABS
650.0000 mg | ORAL_TABLET | Freq: Four times a day (QID) | ORAL | Status: DC | PRN
  Administered 2017-05-19: 650 mg via ORAL
  Filled 2017-05-18: qty 2

## 2017-05-18 MED ORDER — FUROSEMIDE 10 MG/ML IJ SOLN
60.0000 mg | Freq: Once | INTRAMUSCULAR | Status: AC
  Administered 2017-05-18: 60 mg via INTRAVENOUS
  Filled 2017-05-18: qty 8

## 2017-05-18 MED ORDER — PIPERACILLIN-TAZOBACTAM 3.375 G IVPB 30 MIN
3.3750 g | Freq: Once | INTRAVENOUS | Status: AC
  Administered 2017-05-18: 3.375 g via INTRAVENOUS
  Filled 2017-05-18: qty 50

## 2017-05-18 NOTE — ED Notes (Signed)
Pt sitting on side of bed. Pt given ginger ale and meal tray.

## 2017-05-18 NOTE — ED Notes (Signed)
Date and time results received: 05/18/17 1600   Test: lactic acid Critical Value: 2.0  Name of Provider Notified: Dr. Alfred Levins

## 2017-05-18 NOTE — ED Triage Notes (Signed)
Pt brought in via ems from [facility] primary care with a  cough, congestion, and leg swelling.  Pt reports intermittent sob. No chest pain.  Sx for 2 weeks.  Pt alert.  Pt has right aka    Pt has left leg swelling and redness.  Pt is diabetic.

## 2017-05-18 NOTE — ED Provider Notes (Signed)
[facility] Emergency Department Provider Note  ____________________________________________  Time seen: Approximately 5:36 PM  I have reviewed the triage vital signs and the nursing notes.   HISTORY  Chief Complaint Cough and Leg Swelling   HPI Russell [name] is a 52 y.o. male the history of CHF (EF 30-35% on 06/2016), COPD, diabetes, hypertension, hyperlipidemia who presents for evaluation of cough and leg swelling.patient reports one week of congestion, productive cough, wheezing, shortness of breath. He does not have an inhaler at home. he shortness of breath is constant, and severe with minimal exertion. He denies fever but has had chills. He denies vomiting or diarrhea.patient is also complaining of progressively worsening swelling of his left lower extremity. He is also noted redness and warmth for over a week. He denies pain however reports that he has no sensation in that leg due to poorly controlled diabetes. Patient reports that he has a lot of fluid leaking from his leg. He has tried increasing his Lasix at home with no help. No CP, abdominal pain.  Past Medical History:  Diagnosis Date  . CHF (congestive heart failure) ([place])   . Depression   . Diabetes mellitus without complication ([place])   . Hyperlipemia   . Hypertension     Patient Active Problem List   Diagnosis Date Noted  . Cellulitis and abscess of left leg 08/08/2016  . 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 Hyperglycemia 08/08/2016  . Morbid obesity ([place]) 08/08/2016  . Cellulitis and abscess of leg 08/08/2016  . Adjustment disorder with depressed mood 11/21/2014  . Other social stressor 11/21/2014  . 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 Diabetes ([place]) 11/21/2014    Past Surgical History:  Procedure Laterality Date  . BELOW KNEE LEG AMPUTATION     Right leg    Prior to Admission medications   Medication Sig Start Date End Date Taking? Authorizing  Provider  amiodarone (PACERONE) 400 MG tablet Take 1 tablet (400 mg total) by mouth daily. 08/13/16   Vaughan Basta, MD  apixaban (ELIQUIS) 5 MG TABS tablet Take 1 tablet (5 mg total) by mouth 2 (two) times daily. 08/12/16   Vaughan Basta, MD  aspirin EC 81 MG tablet Take 81 mg by mouth daily.    [provider]  atorvastatin (LIPITOR) 10 MG tablet Take 10 mg by mouth daily.    [provider]  citalopram (CELEXA) 20 MG tablet Take 20 mg by mouth daily.    [provider]  finasteride (PROSCAR) 5 MG tablet Take 1 tablet (5 mg total) by mouth daily. 08/13/16   Vaughan Basta, MD  furosemide (LASIX) 40 MG tablet Take 1 tablet (40 mg total) by mouth daily. 08/12/16   Vaughan Basta, MD  insulin aspart (NOVOLOG) 100 UNIT/ML injection Inject 5 Units into the skin 3 (three) times daily with meals. 08/12/16   Vaughan Basta, MD  insulin glargine (LANTUS) 100 UNIT/ML injection Inject 0.28 mLs (28 Units total) into the skin 2 (two) times daily. 08/12/16   Vaughan Basta, MD  nitroGLYCERIN (NITROSTAT) 0.4 MG SL tablet Place 0.4 mg under the tongue every 5 (five) minutes as needed for chest pain.    [provider]  pregabalin (LYRICA) 100 MG capsule Take 100 mg by mouth 3 (three) times daily.    [provider]  tamsulosin (FLOMAX) 0.4 MG CAPS capsule Take 1 capsule (0.4 mg total) by mouth daily. 08/13/16   Vaughan Basta, MD  traMADol Veatrice Bourbon) 50  MG tablet Take 1 tablet (50 mg total) by mouth every 6 (six) hours as needed for moderate pain. 08/12/16   Vaughan Basta, MD  venlafaxine XR (EFFEXOR-XR) 150 MG 24 hr capsule Take 150 mg by mouth 2 (two) times daily.    [provider]    Allergies Shellfish allergy  No family history on file.  Social History Social History   Tobacco Use  . Smoking status: Never Smoker  . Smokeless tobacco: Never Used  Substance Use Topics  . Alcohol use: No    . Drug use: No    Review of Systems  Constitutional: Negative for fever. Eyes: Negative for visual changes. ENT: Negative for sore throat. + congestion Neck: No neck pain  Cardiovascular: Negative for chest pain. Respiratory: + shortness of breath, wheezing, cough Gastrointestinal: Negative for abdominal pain, vomiting or diarrhea. Genitourinary: Negative for dysuria. Musculoskeletal: Negative for back pain. + LLE swelling, redness, and warmth Skin: Negative for rash. Neurological: Negative for headaches, weakness or numbness. Psych: No SI or HI  ____________________________________________   PHYSICAL EXAM:  VITAL SIGNS: ED Triage Vitals  Enc Vitals Group     BP 05/18/17 1518 135/67     Pulse Rate 05/18/17 1518 77     Resp 05/18/17 1700 17     Temp 05/18/17 1518 98.3 F (36.8 C)     Temp Source 05/18/17 1518 Oral     SpO2 05/18/17 1518 97 %     Weight --      Height --      Head Circumference --      Peak Flow --      Pain Score 05/18/17 1524 8     Pain Loc --      Pain Edu? --      Excl. in [place]? --     Constitutional: Alert and oriented,  no apparent distress. HEENT:      Head: Normocephalic and atraumatic.         Eyes: Conjunctivae are normal. Sclera is non-icteric.       Mouth/Throat: Mucous membranes are moist.       Neck: Supple with no signs of meningismus. Cardiovascular: Regular rate and rhythm. No murmurs, gallops, or rubs. 2+ symmetrical distal pulses are present in all extremities. No JVD. Respiratory: Normal respiratory effort. decreased air movement bilaterally with faint expiratory wheezes Gastrointestinal: Soft, non tender, and non distended with positive bowel sounds. No rebound or guarding. Musculoskeletal: 2+ pitting edema on the left lower extremity with a large area of erythema and warmth from the knee down  Neurologic: Normal speech and language. Face is symmetric. Moving all extremities. No gross focal neurologic deficits are  appreciated. Skin: Skin is warm, dry and intact. No rash noted. Psychiatric: Mood and affect are normal. Speech and behavior are normal.  ____________________________________________   LABS (all labs ordered are listed, but only abnormal results are displayed)  Labs Reviewed  COMPREHENSIVE METABOLIC PANEL - Abnormal; Notable for the following components:      Result Value   Chloride 99 (*)    Glucose, Bld 422 (*)    BUN 25 (*)    All other components within normal limits  CBC WITH DIFFERENTIAL/PLATELET - Abnormal; Notable for the following components:   MCH 25.7 (*)    Neutro Abs 6.6 (*)    Lymphs Abs 0.9 (*)    All other components within normal limits  TROPONIN I  LACTIC ACID, PLASMA   ____________________________________________  EKG  ED ECG REPORT  I, Rudene Re, the attending physician, personally viewed and interpreted this ECG.  Normal sinus rhythm, rate of 80, normal intervals, right axis deviation, no ST elevations or depressions.unchanged from prior from 2017 ____________________________________________  RADIOLOGY  I have personally reviewed the images performed during this visit and I agree with the Radiologist's read.   Interpretation by Radiologist:  Dg Chest 2 View  Result Date: 05/18/2017 CLINICAL DATA:  CHF, shortness of breath, cough and jaw pain for 2 weeks, diabetes mellitus, hypertension, leg swelling and infection for 2-3 months EXAM: CHEST - 2 VIEW COMPARISON:  08/09/2016 FINDINGS: Normal heart size, mediastinal contours, and pulmonary vascularity. Bronchitic changes with slight accentuation of perihilar markings. No definite pulmonary infiltrate, pleural effusion or pneumothorax. Bones unremarkable. IMPRESSION: Mild bronchitic changes without definite infiltrate. Electronically Signed   By: Lavonia Dana M.D.   On: 05/18/2017 16:14     ____________________________________________   PROCEDURES  Procedure(s) performed:  None Procedures Critical Care performed:  None ____________________________________________   INITIAL IMPRESSION / ASSESSMENT AND PLAN / ED COURSE   52 y.o. male the history of CHF (EF 30-35% on 06/2016), COPD, diabetes, hypertension, hyperlipidemia who presents for evaluation of cough and leg swelling.  Cough, congestion, sob, wheezing: No fever, normal WBC, CXR with no infiltrate. Presentation concerning for viral URI and COPD exacerbation. Will give duonebs and steroids. No O2 requirement.  Leg swelling, erythema, and warmth consistent with cellulitis on a poorly controlled diabetic who already had an amputation of the R leg. Normal WBC, no signs of sepsis. Will start patient on vanc and zosyn. Will diurese with IV lasix. Will admit to Hospitalist.      As part of my medical decision making, I reviewed the following data within the [facility] notes reviewed and incorporated, Labs reviewed , EKG interpreted , Old EKG reviewed, Radiograph reviewed , Discussed with admitting physician , Notes from prior ED visits and [place] Controlled Substance Database    Pertinent labs & imaging results that were available during my care of the patient were reviewed by me and considered in my medical decision making (see chart for details).    ____________________________________________   FINAL CLINICAL IMPRESSION(S) / ED DIAGNOSES  Final diagnoses:  Bronchitis  Cellulitis of left lower extremity      NEW MEDICATIONS STARTED DURING THIS VISIT:  ED Discharge Orders    None       Note:  This document was prepared using Dragon voice recognition software and may include unintentional dictation errors.    Alfred Levins, [place], MD 05/18/17 [phone]

## 2017-05-18 NOTE — ED Notes (Signed)
Pt oxygen on room air desatted to 88-89%. Placed on 2 L nasal cannula. Will continue to monitor.

## 2017-05-18 NOTE — Progress Notes (Signed)
Pharmacy Antibiotic Note  Russell [name] is a 52 y.o. male admitted on 05/18/2017 with sepsis.  Pharmacy has been consulted for Zosyn and vancomycin dosing.  Plan: 1. Check MRSA PCR and PCT per protocol 2. Zosyn 3.375 gm IV x 1 in ED followed in 4 hours (extended interval dosing) by Zosyn 4.5 gm IV Q8H EI due to BMI > 40. 3. Vancomycin 1 gm IV x 1 in ED, will give another 1 gm IV x 1 to complete 2 gm IV x 1 initial dose followed by vancomycin 1.25 gm IV Q8H. Patient was therapeutic on this regimen last summer. Pharmacy will continue to follow and adjust as needed to maintain trough 15 to 20 mcg/mL.   Vd 81.4 L, Ke 0.131 hr-1, T1/2 5.3 hr  Height: 5\' 11"  (180.3 cm) Weight: (!) 392 lb (177.8 kg) IBW/kg (Calculated) : 75.3  Temp (24hrs), Avg:98.3 F (36.8 C), Min:98.3 F (36.8 C), Max:98.3 F (36.8 C)  Recent Labs  Lab 05/18/17 1527 05/18/17 1725  WBC 8.3  --   CREATININE 0.93  --   LATICACIDVEN  --  2.0*    Estimated Creatinine Clearance: 152.8 mL/min (by C-G formula based on SCr of 0.93 mg/dL).    Allergies  Allergen Reactions  . Shellfish Allergy Hives    Antimicrobials this admission:   >>    >>   Dose adjustments this admission:   Microbiology results:  BCx:   UCx:    Sputum:    MRSA PCR:   Thank you for allowing pharmacy to be a part of this patient's care.  Laural Benes, Pharm.D., BCPS Clinical Pharmacist 05/18/2017 6:42 PM

## 2017-05-18 NOTE — ED Notes (Signed)
FN: pt to ed via ems with cellulitis, took 4 tabs of lasix this am to help with edema also reports cough. Pt is diabetic and did take his insulin.

## 2017-05-18 NOTE — H&P (Signed)
[facility] NAME: Russell [name]    MR#:  [id number]  DATE OF BIRTH:  Apr 09, 1965  DATE OF ADMISSION:  05/18/2017  PRIMARY CARE PHYSICIAN: System, Pcp Not In   REQUESTING/REFERRING PHYSICIAN: Dr. Rudene Re  CHIEF COMPLAINT:   Chief Complaint  Patient presents with  . Cough  . Leg Swelling    HISTORY OF PRESENT ILLNESS:  Russell [name]  is a 52 y.o. 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 Patient says that he was homeless before and his Mom took him in, but her home is not handicap equipped, so he stays in his wheelchair all day and watches TV. Doesn't have a way to get to appointments. 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 His left leg has been significantly swollen for a long time and now has some ulcerations. It is red and significantly tender. He has been taking oral Lasix 2 tablets a day due to the worsening swelling. He has been complaining of cough which is mostly dry associated with some dyspnea. Denies any chest pain. Denies any fevers or chills. He called EMS today due to the cough. Here he is noted to have cellulitis and diabetic foot infection of the left leg. Chest x-ray showing some bronchitis.  PAST MEDICAL HISTORY:   Past Medical History:  Diagnosis Date  . CHF (congestive heart failure) (HCC)    EF 30-35%  . Depression   . Diabetes mellitus without complication ([place])   . Hyperlipemia   . Hypertension     PAST SURGICAL HISTORY:   Past Surgical History:  Procedure Laterality Date  . APPENDECTOMY    . BELOW KNEE LEG AMPUTATION     Right leg  . TONSILLECTOMY      SOCIAL HISTORY:   Social History   Tobacco Use  . Smoking status: Never Smoker  .  Smokeless tobacco: Never Used  Substance Use Topics  . Alcohol use: No    FAMILY HISTORY:   Family History  Problem Relation Age of Onset  . COPD Mother   . Diabetes Father   . CAD Father   . Kidney cancer Brother     DRUG ALLERGIES:   Allergies  Allergen Reactions  . Shellfish Allergy Hives    REVIEW OF SYSTEMS:   Review of Systems  Constitutional: Positive for malaise/fatigue. Negative for chills, fever and weight loss.  HENT: Negative for ear discharge, ear pain, hearing loss and nosebleeds.   Eyes: Negative for blurred vision, double vision and photophobia.  Respiratory: Positive for cough and shortness of breath. Negative for hemoptysis and wheezing.   Cardiovascular: Positive for orthopnea and leg swelling. Negative for chest pain and palpitations.  Gastrointestinal: Negative for abdominal pain, constipation, diarrhea, melena, nausea and vomiting.  Genitourinary: Negative for dysuria and urgency.  Musculoskeletal: Positive for joint pain and myalgias. Negative for back pain and neck pain.  Skin: Negative for rash.  Neurological: Positive for sensory change and headaches. Negative for dizziness, tingling, speech change and focal weakness.  Endo/Heme/Allergies: Does not bruise/bleed easily.  Psychiatric/Behavioral: Positive for depression. Negative for suicidal ideas.    MEDICATIONS AT HOME:   Prior to Admission medications   Medication Sig Start Date End Date Taking? Authorizing Provider  amiodarone (PACERONE) 400 MG  tablet Take 1 tablet (400 mg total) by mouth daily. 08/13/16   Vaughan Basta, MD  apixaban (ELIQUIS) 5 MG TABS tablet Take 1 tablet (5 mg total) by mouth 2 (two) times daily. 08/12/16   Vaughan Basta, MD  aspirin EC 81 MG tablet Take 81 mg by mouth daily.    [provider]  atorvastatin (LIPITOR) 10 MG tablet Take 10 mg by mouth daily.    [provider]  citalopram (CELEXA) 20 MG tablet Take 20 mg by mouth daily.     [provider]  finasteride (PROSCAR) 5 MG tablet Take 1 tablet (5 mg total) by mouth daily. 08/13/16   Vaughan Basta, MD  furosemide (LASIX) 40 MG tablet Take 1 tablet (40 mg total) by mouth daily. 08/12/16   Vaughan Basta, MD  insulin aspart (NOVOLOG) 100 UNIT/ML injection Inject 5 Units into the skin 3 (three) times daily with meals. 08/12/16   Vaughan Basta, MD  insulin glargine (LANTUS) 100 UNIT/ML injection Inject 0.28 mLs (28 Units total) into the skin 2 (two) times daily. 08/12/16   Vaughan Basta, MD  nitroGLYCERIN (NITROSTAT) 0.4 MG SL tablet Place 0.4 mg under the tongue every 5 (five) minutes as needed for chest pain.    [provider]  pregabalin (LYRICA) 100 MG capsule Take 100 mg by mouth 3 (three) times daily.    [provider]  tamsulosin (FLOMAX) 0.4 MG CAPS capsule Take 1 capsule (0.4 mg total) by mouth daily. 08/13/16   Vaughan Basta, MD  traMADol (ULTRAM) 50 MG tablet Take 1 tablet (50 mg total) by mouth every 6 (six) hours as needed for moderate pain. 08/12/16   Vaughan Basta, MD  venlafaxine XR (EFFEXOR-XR) 150 MG 24 hr capsule Take 150 mg by mouth 2 (two) times daily.    [provider]      VITAL SIGNS:  Blood pressure 140/73, pulse 84, temperature 98.3 F (36.8 C), temperature source Oral, resp. rate 17, SpO2 98 %.  PHYSICAL EXAMINATION:   Physical Exam  GENERAL:  52 y.o.-year-old morbidly obese patient sitting in the bed with no acute distress.  EYES: Pupils equal, round, reactive to light and accommodation. No scleral icterus. Extraocular muscles intact.  HEENT: Head atraumatic, normocephalic. Oropharynx and nasopharynx clear.  NECK:  Short, thick neck. Supple, no jugular venous distention. No thyroid enlargement, no tenderness.  LUNGS: Normal breath sounds bilaterally, no wheezing, rales,rhonchi or crepitation. No use of accessory muscles of respiration. Decreased bibasilar  breath sounds CARDIOVASCULAR: S1, S2 normal. No murmurs, rubs, or gallops.  ABDOMEN: Soft, obese, nontender, nondistended. Bowel sounds present. No organomegaly or mass.  EXTREMITIES: right BKA, left  Leg has 3+ edema, very erythematous, swollen, tender with ulcerations along medial big toe, on the dorsum towards the lateral part of foot and on the anterior leg.  No  cyanosis, or clubbing.  NEUROLOGIC: Cranial nerves II through XII are intact.no focal motor deficits. Sensation decreased to light touch below knees. Gait not checked. Global weakness PSYCHIATRIC: The patient is alert and oriented x 3.  SKIN: No obvious rash, lesion, or ulcer.   LABORATORY PANEL:   CBC Recent Labs  Lab 05/18/17 1527  WBC 8.3  HGB 15.0  HCT 47.1  PLT 200   ------------------------------------------------------------------------------------------------------------------  Chemistries  Recent Labs  Lab 05/18/17 1527  NA 135  K 4.4  CL 99*  CO2 25  GLUCOSE 422*  BUN 25*  CREATININE 0.93  CALCIUM 9.1  AST 24  ALT 18  ALKPHOS 119  BILITOT 0.6   ------------------------------------------------------------------------------------------------------------------  Cardiac Enzymes Recent Labs  Lab 05/18/17 1527  TROPONINI <0.03   ------------------------------------------------------------------------------------------------------------------  RADIOLOGY:  Dg Chest 2 View  Result Date: 05/18/2017 CLINICAL DATA:  CHF, shortness of breath, cough and jaw pain for 2 weeks, diabetes mellitus, hypertension, leg swelling and infection for 2-3 months EXAM: CHEST - 2 VIEW COMPARISON:  08/09/2016 FINDINGS: Normal heart size, mediastinal contours, and pulmonary vascularity. Bronchitic changes with slight accentuation of perihilar markings. No definite pulmonary infiltrate, pleural effusion or pneumothorax. Bones unremarkable. IMPRESSION: Mild bronchitic changes without definite infiltrate. Electronically Signed    By: Lavonia Dana M.D.   On: 05/18/2017 16:14    EKG:   Orders placed or performed during the hospital encounter of 05/18/17  . EKG 12-Lead  . EKG 12-Lead  . EKG 12-Lead  . EKG 12-Lead  . ED EKG  . ED EKG    IMPRESSION AND PLAN:   Russell [name]  is a 52 y.o. 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  1. LLE cellulitis with diabetic foot ulcerations- secondary to uncontrolled diabetes and also chronic lower extremity edema. -Admit, podiatry and ID consults -Started on vancomycin and Zosyn for now -patient states he has had an angiogram done about a year ago which was normal in that left leg. -Blood cultures ordered.  2. Acute bronchitis-already on antibiotics. Chest x-ray otherwise clear. -Cough medications added. Not needing supplemental oxygen.  3. Uncontrolled diabetes mellitus-patient does not follow dietary restrictions and also stopped taking his Levemir because he feels that it's not helping his sugars. -Started on a carb-controlled diet, diabetes coordinator consult -Started on Lantus twice a day and sliding scale insulin and pre-meal insulin and monitor. -A1c is pending  4. Acute on chronic combined CHF exacerbation- change lasix to IV bid for now and monitor - last ECHO as outpatient with EF 30-35% in may 2018 - start beta blocker and ARB if BP elevated  5. Paroxysmal atrial fibrillation-patient hasn't been taking his amiodarone and eliquis anyways. He seems to be in normal sinus rhythm at this time. We will hold off on this medications.  6. Hypertension-started on losartan and coreg as not taking any medications at home Monitor and adjust the doses  7. Depression-depressed and tearful about his living situation. He feels that he has no other option but living with his mom who is not  happy with him.. Denies any suicidal ideation. -Continue Effexor  8. DVT prophylaxis-on subcutaneous heparin    Wheelchair bound at baseline    All the records are reviewed and case discussed with ED provider. Management plans discussed with the patient, family and they are in agreement.  CODE STATUS: Full Code  TOTAL TIME TAKING CARE OF THIS PATIENT: 55 minutes.    Gladstone Lighter M.D on 05/18/2017 at 6:10 PM  Between 7am to 6pm - Pager - [phone]  After 6pm go to [email or web address] - password [email or web address] [facility]  Office  [phone]  CC: Primary care physician; System, Pcp Not In

## 2017-05-18 NOTE — ED Notes (Signed)
Pt states that his left leg has been red, warm, and swollen for 3 weeks. He thinks its infected. He has fluid leaking from his leg as well. He also has been sick for over a week. He has a cough, pressure behind eyes, congestion, and his Head "feels like he has screws in his head".

## 2017-05-18 NOTE — ED Notes (Signed)
EMS to lobby from [place] [facility] with lower extremity edema 160 lasix this AM

## 2017-05-19 LAB — BASIC METABOLIC PANEL
ANION GAP: 14 (ref 5–15)
BUN: 30 mg/dL — ABNORMAL HIGH (ref 6–20)
CO2: 23 mmol/L (ref 22–32)
Calcium: 8.8 mg/dL — ABNORMAL LOW (ref 8.9–10.3)
Chloride: 94 mmol/L — ABNORMAL LOW (ref 101–111)
Creatinine, Ser: 1.2 mg/dL (ref 0.61–1.24)
GLUCOSE: 538 mg/dL — AB (ref 65–99)
POTASSIUM: 4.3 mmol/L (ref 3.5–5.1)
Sodium: 131 mmol/L — ABNORMAL LOW (ref 135–145)

## 2017-05-19 LAB — CBC
HEMATOCRIT: 41.6 % (ref 40.0–52.0)
HEMOGLOBIN: 13.5 g/dL (ref 13.0–18.0)
MCH: 26 pg (ref 26.0–34.0)
MCHC: 32.6 g/dL (ref 32.0–36.0)
MCV: 79.9 fL — ABNORMAL LOW (ref 80.0–100.0)
Platelets: 188 10*3/uL (ref 150–440)
RBC: 5.2 MIL/uL (ref 4.40–5.90)
RDW: 14.5 % (ref 11.5–14.5)
WBC: 9.8 10*3/uL (ref 3.8–10.6)

## 2017-05-19 LAB — GLUCOSE, CAPILLARY
GLUCOSE-CAPILLARY: 347 mg/dL — AB (ref 65–99)
GLUCOSE-CAPILLARY: 357 mg/dL — AB (ref 65–99)
GLUCOSE-CAPILLARY: 548 mg/dL — AB (ref 65–99)
Glucose-Capillary: 467 mg/dL — ABNORMAL HIGH (ref 65–99)
Glucose-Capillary: 431 mg/dL — ABNORMAL HIGH (ref 65–99)
Glucose-Capillary: 286 mg/dL — ABNORMAL HIGH (ref 65–99)

## 2017-05-19 LAB — PROTIME-INR
INR: 1.04
Prothrombin Time: 13.5 seconds (ref 11.4–15.2)

## 2017-05-19 LAB — PROCALCITONIN

## 2017-05-19 MED ORDER — ATORVASTATIN CALCIUM 20 MG PO TABS
40.0000 mg | ORAL_TABLET | Freq: Every day | ORAL | Status: DC
  Administered 2017-05-20: 40 mg via ORAL
  Filled 2017-05-19: qty 2

## 2017-05-19 MED ORDER — POLYETHYLENE GLYCOL 3350 17 G PO PACK
17.0000 g | PACK | Freq: Every day | ORAL | Status: DC
  Administered 2017-05-19 – 2017-05-20 (×2): 17 g via ORAL
  Filled 2017-05-19 (×2): qty 1

## 2017-05-19 MED ORDER — INSULIN REGULAR HUMAN 100 UNIT/ML IJ SOLN
80.0000 [IU] | Freq: Once | INTRAMUSCULAR | Status: AC
  Administered 2017-05-19: 80 [IU] via SUBCUTANEOUS
  Filled 2017-05-19: qty 0.8

## 2017-05-19 MED ORDER — INSULIN ASPART 100 UNIT/ML ~~LOC~~ SOLN
25.0000 [IU] | Freq: Once | SUBCUTANEOUS | Status: AC
  Administered 2017-05-19: 25 [IU] via SUBCUTANEOUS
  Filled 2017-05-19: qty 1

## 2017-05-19 MED ORDER — LISINOPRIL 10 MG PO TABS
10.0000 mg | ORAL_TABLET | Freq: Every day | ORAL | Status: DC
  Administered 2017-05-19 – 2017-05-20 (×2): 10 mg via ORAL
  Filled 2017-05-19 (×2): qty 1

## 2017-05-19 MED ORDER — INSULIN ASPART 100 UNIT/ML ~~LOC~~ SOLN
10.0000 [IU] | Freq: Three times a day (TID) | SUBCUTANEOUS | Status: DC
  Administered 2017-05-19 – 2017-05-20 (×2): 10 [IU] via SUBCUTANEOUS
  Filled 2017-05-19 (×2): qty 1

## 2017-05-19 MED ORDER — INSULIN REGULAR HUMAN 100 UNIT/ML IJ SOLN
30.0000 [IU] | Freq: Once | INTRAMUSCULAR | Status: AC
  Administered 2017-05-19: 30 [IU] via INTRAVENOUS
  Filled 2017-05-19: qty 0.3

## 2017-05-19 MED ORDER — CEPHALEXIN 500 MG PO CAPS
500.0000 mg | ORAL_CAPSULE | Freq: Three times a day (TID) | ORAL | Status: DC
  Administered 2017-05-19 – 2017-05-20 (×4): 500 mg via ORAL
  Filled 2017-05-19 (×4): qty 1

## 2017-05-19 NOTE — Care Management (Signed)
RNCM met with patient to discuss discharge disposition. RNCM introduced self to patient.  Patient states "I have been waiting to see a social worker to go back to a nursing home".  RNCM informed patient that there is currently not a skilled need for patient to go to a nursing home.  Patient lift his leg and states "they can do dressing changes on my foot".  RNCM informed patient that this would not qualify for insurance to pay for him to go to a SNF, however a home health nurse could be arranged for dressing changes.     Patient states that he is currently living in his mothers home but "she wants me out".  Patient states that his PCP is [facility] and that he has no issues obtaining his medication.  When [facility] asked about issues with transportation or obtaining medication, patient was defensive and spoke loudly stating "who told you I had trouble getting anything". Patient has a hx of BKA, however has a prosthetic in place.  States he has an Transport planner and drives his self if needed to appointments.    When RNCM question patient if he had ever followed up at outpatient wound care clinic he states" how am I supposed to get there I don't have transportation".  RNCM asked for clarification, as patient previously stated that he has a car and drives himself places.  Patient states I do have a car, but it doesn't have insurances or tags, and will get arrested if I drive it.  Patient loudly states "you can get out of here if you are not going to be able to get me back to a nursing home like I want".  RNCM asked if I could set up home health at discharge. And patient declined all services and asked that I leave.  RNCM signing off.  Please consult if indicated.

## 2017-05-19 NOTE — Progress Notes (Signed)
Unna boot applied to left lower extremity, pedal pulse was +1 prior to and unable to access post applying. Capillary refill less than 3 seconds. Patient reported that unna boot not too tight.

## 2017-05-19 NOTE — Consult Note (Signed)
[facility] Infectious Disease     Reason for Consult: LLE cellulitis    Referring Physician: Kalisetti, R Date of Admission:  05/18/2017   Active Problems:   Cellulitis   HPI: Beecher E Eley is a 52 y.o. male with hx morbid obesity, R BKA, DM (A1c 9.6) , CHF admitted with LLE cellulitis as well as cough and congestion. He has hx of prior wound and infection on this leg.  On admit wbc nml, no fever.  PC nml. CXR with bronchitis changes.  Past Medical History:  Diagnosis Date  . CHF (congestive heart failure) (HCC)    EF 30-35%  . Depression   . Diabetes mellitus without complication (HCC)   . Hyperlipemia   . Hypertension    Past Surgical History:  Procedure Laterality Date  . APPENDECTOMY    . BELOW KNEE LEG AMPUTATION     Right leg  . TONSILLECTOMY     Social History   Tobacco Use  . Smoking status: Never Smoker  . Smokeless tobacco: Never Used  Substance Use Topics  . Alcohol use: No  . Drug use: No   Family History  Problem Relation Age of Onset  . COPD Mother   . Diabetes Father   . CAD Father   . Kidney cancer Brother     Allergies:  Allergies  Allergen Reactions  . Shellfish Allergy Hives    Current antibiotics: Antibiotics Given (last 72 hours)    Date/Time Action Medication Dose Rate   05/18/17 1739 New Bag/Given   piperacillin-tazobactam (ZOSYN) IVPB 3.375 g 3.375 g 100 mL/hr   05/18/17 1744 New Bag/Given   vancomycin (VANCOCIN) IVPB 1000 mg/200 mL premix 1,000 mg 200 mL/hr   05/18/17 2109 New Bag/Given   vancomycin (VANCOCIN) IVPB 1000 mg/200 mL premix 1,000 mg 200 mL/hr   05/19/17 0001 New Bag/Given   piperacillin-tazobactam (ZOSYN) 4.5 g in dextrose 5 % 100 mL injection  25 mL/hr   05/19/17 0549 New Bag/Given   vancomycin (VANCOCIN) 1,250 mg in sodium chloride 0.9 % 250 mL IVPB 1,250 mg 166.7 mL/hr      MEDICATIONS: . aspirin EC  81 mg Oral Daily  . atorvastatin  10 mg Oral Daily  . carvedilol  3.125 mg Oral BID WC  .  chlorpheniramine-HYDROcodone  5 mL Oral Q12H  . finasteride  5 mg Oral Daily  . furosemide  40 mg Intravenous Q12H  . heparin  5,000 Units Subcutaneous Q8H  . insulin aspart  0-20 Units Subcutaneous TID AC & HS  . insulin aspart  5 Units Subcutaneous TID WC  . insulin glargine  20 Units Subcutaneous BID  . losartan  50 mg Oral Daily  . pregabalin  100 mg Oral TID  . tamsulosin  0.4 mg Oral Daily  . venlafaxine XR  150 mg Oral BID WC    Review of Systems - 11 systems reviewed and negative per HPI   OBJECTIVE: Temp:  [97.8 F (36.6 C)-98.4 F (36.9 C)] 97.8 F (36.6 C) (03/21 0600) Pulse Rate:  [77-91] 81 (03/21 0818) Resp:  [13-20] 16 (03/21 0600) BP: (115-145)/(44-73) 145/71 (03/21 0818) SpO2:  [89 %-100 %] 96 % (03/21 0600) Weight:  [177.8 kg (392 lb)] 177.8 kg (392 lb) (03/20 1827) Physical Exam  Constitutional: He is oriented to person, place, and time.  Morbidly obese  HENT: anicteric Mouth/Throat: Oropharynx is clear and moist. No oropharyngeal exudate.  Cardiovascular: Normal rate,distant Pulmonary/Chest: Effort normal and breath sounds normal. No respiratory distress. He has   no wheezes.  Abdominal: Soft. Bowel sounds are normal. He exhibits no distension. There is no tenderness.  Lymphadenopathy: He has no cervical adenopathy.  Neurological: He is alert and oriented to person, place, and time.  Ext R bka L massive edema to knee Skin: midl erythema LLE, 2 dry wounds on feet Psychiatric: He has a normal mood and affect. His behavior is normal.     LABS: Results for orders placed or performed during the hospital encounter of 05/18/17 (from the past 48 hour(s))  Comprehensive metabolic panel     Status: Abnormal   Collection Time: 05/18/17  3:27 PM  Result Value Ref Range   Sodium 135 135 - 145 mmol/L   Potassium 4.4 3.5 - 5.1 mmol/L   Chloride 99 (L) 101 - 111 mmol/L   CO2 25 22 - 32 mmol/L   Glucose, Bld 422 (H) 65 - 99 mg/dL   BUN 25 (H) 6 - 20 mg/dL    Creatinine, Ser 0.93 0.61 - 1.24 mg/dL   Calcium 9.1 8.9 - 10.3 mg/dL   Total Protein 7.8 6.5 - 8.1 g/dL   Albumin 3.8 3.5 - 5.0 g/dL   AST 24 15 - 41 U/L   ALT 18 17 - 63 U/L   Alkaline Phosphatase 119 38 - 126 U/L   Total Bilirubin 0.6 0.3 - 1.2 mg/dL   GFR calc non Af Amer >60 >60 mL/min   GFR calc Af Amer >60 >60 mL/min    Comment: (NOTE) The eGFR has been calculated using the CKD EPI equation. This calculation has not been validated in all clinical situations. eGFR's persistently <60 mL/min signify possible Chronic Kidney Disease.    Anion gap 11 5 - 15    Comment: Performed at [facility], [address] [place], [place] [address]  Troponin I     Status: None   Collection Time: 05/18/17  3:27 PM  Result Value Ref Range   Troponin I <0.03 <0.03 ng/mL    Comment: Performed at [facility], [address] [place], [place] [address]  CBC with Differential     Status: Abnormal   Collection Time: 05/18/17  3:27 PM  Result Value Ref Range   WBC 8.3 3.8 - 10.6 K/uL   RBC 5.85 4.40 - 5.90 MIL/uL   Hemoglobin 15.0 13.0 - 18.0 g/dL   HCT 47.1 40.0 - 52.0 %   MCV 80.4 80.0 - 100.0 fL   MCH 25.7 (L) 26.0 - 34.0 pg   MCHC 32.0 32.0 - 36.0 g/dL   RDW 14.5 11.5 - 14.5 %   Platelets 200 150 - 440 K/uL    Comment: PLATELET COUNT CONFIRMED BY SMEAR   Neutrophils Relative % 79 %   Neutro Abs 6.6 (H) 1.4 - 6.5 K/uL   Lymphocytes Relative 11 %   Lymphs Abs 0.9 (L) 1.0 - 3.6 K/uL   Monocytes Relative 6 %   Monocytes Absolute 0.5 0.2 - 1.0 K/uL   Eosinophils Relative 3 %   Eosinophils Absolute 0.2 0 - 0.7 K/uL   Basophils Relative 1 %   Basophils Absolute 0.0 0 - 0.1 K/uL    Comment: Performed at [facility], [address] [place],  [address]  Lactic acid, plasma     Status: Abnormal   Collection Time: 05/18/17  5:25 PM  Result Value Ref Range   Lactic Acid, Venous 2.0 (HH) 0.5 - 1.9 mmol/L    Comment: CRITICAL RESULT CALLED TO, READ BACK BY    AND VERIFIED WITH KATE BUMGARNER RN [id number] 05/18/17.MSS Performed at [facility], [address] [place], [place] [address]   Brain natriuretic peptide     Status: None   Collection Time: 05/18/17  6:13 PM  Result Value Ref Range   B Natriuretic Peptide 15.0 0.0 - 100.0 pg/mL    Comment: Performed at [facility], [address] [place], [place] [address]  Hemoglobin A1c     Status: Abnormal   Collection Time: 05/18/17  8:34 PM  Result Value Ref Range   Hgb A1c MFr Bld 9.6 (H) 4.8 - 5.6 %    Comment: (NOTE) Pre diabetes:          5.7%-6.4% Diabetes:              >6.4% Glycemic control for   <7.0% adults with diabetes    Mean Plasma Glucose 228.82 mg/dL    Comment: Performed at [facility], [address] [place], [place] [address]  Procalcitonin - Baseline     Status: None   Collection Time: 05/18/17  8:34 PM  Result Value Ref Range   Procalcitonin <0.10 ng/mL    Comment:        Interpretation: PCT (Procalcitonin) <= 0.5 ng/mL: Systemic infection (sepsis) is not likely. Local bacterial infection is possible. (NOTE)       Sepsis PCT Algorithm           Lower Respiratory Tract                                      Infection PCT Algorithm    ----------------------------     ----------------------------         PCT < 0.25 ng/mL                PCT < 0.10 ng/mL         Strongly encourage             Strongly discourage   discontinuation of antibiotics    initiation of antibiotics    ----------------------------     -----------------------------       PCT 0.25 - 0.50 ng/mL            PCT 0.10 - 0.25 ng/mL               OR       >80% decrease in PCT            Discourage initiation of                                            antibiotics      Encourage discontinuation           of antibiotics    ----------------------------     -----------------------------         PCT >= 0.50 ng/mL              PCT 0.26 - 0.50 ng/mL               AND        <80%  decrease in PCT             Encourage initiation of                                               antibiotics       Encourage continuation           of antibiotics    ----------------------------     -----------------------------        PCT >= 0.50 ng/mL                  PCT > 0.50 ng/mL               AND         increase in PCT                  Strongly encourage                                      initiation of antibiotics    Strongly encourage escalation           of antibiotics                                     -----------------------------                                           PCT <= 0.25 ng/mL                                                 OR                                        > 80% decrease in PCT                                     Discontinue / Do not initiate                                             antibiotics Performed at [facility], [address] [place], [place] [address]   Glucose, capillary     Status: Abnormal   Collection Time: 05/18/17  9:33 PM  Result Value Ref Range   Glucose-Capillary >600 (HH) 65 - 99 mg/dL  Glucose, capillary     Status: Abnormal   Collection Time: 05/18/17 10:59 PM  Result Value Ref Range   Glucose-Capillary 564 (HH) 65 - 99 mg/dL  Glucose, capillary     Status: Abnormal   Collection Time: 05/18/17 11:30 PM  Result Value Ref Range   Glucose-Capillary >600 (HH) 65 - 99 mg/dL  Glucose, capillary     Status: Abnormal   Collection Time: 05/19/17 12:40 AM  Result Value Ref Range   Glucose-Capillary 548 (HH) 65 - 99 mg/dL  Glucose, capillary     Status: Abnormal   Collection Time: 05/19/17  3:29 AM  Result Value Ref Range   Glucose-Capillary 467 (H) 65 - 99 mg/dL  Procalcitonin     Status: None  Collection Time: 05/19/17  5:09 AM  Result Value Ref Range   Procalcitonin <0.10 ng/mL    Comment:        Interpretation: PCT (Procalcitonin) <= 0.5 ng/mL: Systemic infection (sepsis) is not likely. Local bacterial  infection is possible. (NOTE)       Sepsis PCT Algorithm           Lower Respiratory Tract                                      Infection PCT Algorithm    ----------------------------     ----------------------------         PCT < 0.25 ng/mL                PCT < 0.10 ng/mL         Strongly encourage             Strongly discourage   discontinuation of antibiotics    initiation of antibiotics    ----------------------------     -----------------------------       PCT 0.25 - 0.50 ng/mL            PCT 0.10 - 0.25 ng/mL               OR       >80% decrease in PCT            Discourage initiation of                                            antibiotics      Encourage discontinuation           of antibiotics    ----------------------------     -----------------------------         PCT >= 0.50 ng/mL              PCT 0.26 - 0.50 ng/mL               AND        <80% decrease in PCT             Encourage initiation of                                             antibiotics       Encourage continuation           of antibiotics    ----------------------------     -----------------------------        PCT >= 0.50 ng/mL                  PCT > 0.50 ng/mL               AND         increase in PCT                  Strongly encourage                                      initiation of antibiotics    Strongly encourage escalation             of antibiotics                                     -----------------------------                                           PCT <= 0.25 ng/mL                                                 OR                                        > 80% decrease in PCT                                     Discontinue / Do not initiate                                             antibiotics Performed at [facility], [address] [place], [place] [address]   Basic metabolic panel     Status: Abnormal   Collection Time: 05/19/17  5:09 AM  Result Value Ref Range    Sodium 131 (L) 135 - 145 mmol/L   Potassium 4.3 3.5 - 5.1 mmol/L   Chloride 94 (L) 101 - 111 mmol/L   CO2 23 22 - 32 mmol/L   Glucose, Bld 538 (HH) 65 - 99 mg/dL    Comment: CRITICAL RESULT CALLED TO, READ BACK BY AND VERIFIED WITH  DAWN SONGSTER AT 0546 05/19/17 SDR    BUN 30 (H) 6 - 20 mg/dL   Creatinine, Ser 1.20 0.61 - 1.24 mg/dL   Calcium 8.8 (L) 8.9 - 10.3 mg/dL   GFR calc non Af Amer >60 >60 mL/min   GFR calc Af Amer >60 >60 mL/min    Comment: (NOTE) The eGFR has been calculated using the CKD EPI equation. This calculation has not been validated in all clinical situations. eGFR's persistently <60 mL/min signify possible Chronic Kidney Disease.    Anion gap 14 5 - 15    Comment: Performed at [facility], [address] [place], [place] [address]  CBC     Status: Abnormal   Collection Time: 05/19/17  5:09 AM  Result Value Ref Range   WBC 9.8 3.8 - 10.6 K/uL   RBC 5.20 4.40 - 5.90 MIL/uL   Hemoglobin 13.5 13.0 - 18.0 g/dL   HCT 41.6 40.0 - 52.0 %   MCV 79.9 (L) 80.0 - 100.0 fL   MCH 26.0 26.0 - 34.0 pg   MCHC 32.6 32.0 - 36.0 g/dL   RDW 14.5 11.5 - 14.5 %   Platelets 188 150 - 440 K/uL    Comment: Performed at [facility], [address] [place], [place] [address]  Protime-INR     Status: None   Collection Time: 05/19/17  5:09 AM  Result   Value Ref Range   Prothrombin Time 13.5 11.4 - 15.2 seconds   INR 1.04     Comment: Performed at [facility], [address] [place], [place] [address]  Glucose, capillary     Status: Abnormal   Collection Time: 05/19/17  7:50 AM  Result Value Ref Range   Glucose-Capillary 431 (H) 65 - 99 mg/dL   Comment 1 Notify RN    No components found for: ESR, C REACTIVE PROTEIN MICRO: No results found for this or any previous visit (from the past 720 hour(s)).  IMAGING: Dg Chest 2 View  Result Date: 05/18/2017 CLINICAL DATA:  CHF, shortness of breath, cough and jaw pain for 2 weeks, diabetes mellitus,  hypertension, leg swelling and infection for 2-3 months EXAM: CHEST - 2 VIEW COMPARISON:  08/09/2016 FINDINGS: Normal heart size, mediastinal contours, and pulmonary vascularity. Bronchitic changes with slight accentuation of perihilar markings. No definite pulmonary infiltrate, pleural effusion or pneumothorax. Bones unremarkable. IMPRESSION: Mild bronchitic changes without definite infiltrate. Electronically Signed   By: Lavonia Dana M.D.   On: 05/18/2017 16:14    Assessment:   Russell [name] is a 52 y.o. male ith hx morbid obesity, R BKA, DM (A1c 9.6) , CHF admitted with LLE cellulitis as well as cough and congestion. He has hx of prior wound and infection on this leg.   Main issue is massive swelling. Very little infection. He needs to be diursesed and have much better control of edema. I discussed this with him but he has an excuse about why he cannot elevate his leg for every suggestion I give.  When I walked in the room he was sitting up with leg on floor.   Recommendations Needs control of edema and diuresis. I have ordered unnaboot - should be changed at home with [facility] every 4-5 days. Will then need to have compression stockings or velcro compression wraps at home and actually use them. No need for cultures Will change to oral keflex for a 7 day course for simple cellulitis Thank you very much for allowing me to participate in the care of this patient. Please call with questions.   Cheral Marker. Ola Spurr, MD

## 2017-05-19 NOTE — Consult Note (Signed)
[facility]                                                      Patient Demographics  Russell [name], is a 52 y.o. male   MRN: [id number]   DOB - 1965-07-13  Admit Date - 05/18/2017    Outpatient Primary MD for the patient is System, Pcp Not In  Consult requested in the Hospital by Fritzi Mandes, MD, On 05/19/2017    Reason for consult diabetic wounds left foot   With History of -  Past Medical History:  Diagnosis Date  . CHF (congestive heart failure) (HCC)    EF 30-35%  . Depression   . Diabetes mellitus without complication ([place])   . Hyperlipemia   . Hypertension       Past Surgical History:  Procedure Laterality Date  . APPENDECTOMY    . BELOW KNEE LEG AMPUTATION     Right leg  . TONSILLECTOMY      in for   Chief Complaint  Patient presents with  . Cough  . Leg Swelling     HPI  Russell [name]  is a 52 y.o. male, patient has history of severe swelling to the left lower leg.  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  He is diabetic and is poorly controlled.  Came in the hospital because of the redness and swelling to his leg wounds have been present for according to him number of weeks on the side of his foot and the top of his foot.  He states that a little bit better now that is been in the hospital for a day.    Review of Systems    In addition to the HPI above,  No Fever-chills, No Headache, No changes with Vision or hearing, No problems swallowing food or Liquids, No Chest pain, Cough or Shortness of Breath, No Abdominal pain, No Nausea or Vommitting, Bowel movements are regular, No Blood in stool or Urine, No dysuria, No new skin rashes or bruises, No new joints pains-aches,  No new weakness, tingling, numbness in any extremity, No recent weight gain or loss, No polyuria, polydypsia or  polyphagia, No significant Mental Stressors.  A full 10 point Review of Systems was done, except as stated above, all other Review of Systems were negative.   Social History Social History   Tobacco Use  . Smoking status: Never Smoker  . Smokeless tobacco: Never Used  Substance Use Topics  . Alcohol use: No    Family History Family History  Problem Relation Age of Onset  . COPD Mother   . Diabetes Father   . CAD Father   . Kidney cancer Brother     Prior to Admission medications   Medication Sig Start Date End Date Taking? Authorizing Provider  atorvastatin (LIPITOR) 40 MG tablet Take 40 mg by mouth daily.    Yes [provider]  furosemide (LASIX) 40 MG tablet Take 1 tablet (40 mg total) by mouth daily. 08/12/16  Yes Vaughan Basta, MD  insulin regular (NOVOLIN R,HUMULIN R) 100 units/mL injection Inject 40-100 Units into the skin 3 (three) times daily before meals.   Yes [provider]  lisinopril (PRINIVIL,ZESTRIL) 10 MG tablet Take 10 mg by mouth daily.   Yes [provider]  pregabalin (LYRICA)  100 MG capsule Take 100 mg by mouth 3 (three) times daily.   Yes [provider]  amiodarone (PACERONE) 400 MG tablet Take 1 tablet (400 mg total) by mouth daily. Patient not taking: Reported on 05/18/2017 08/13/16   Vaughan Basta, MD  apixaban (ELIQUIS) 5 MG TABS tablet Take 1 tablet (5 mg total) by mouth 2 (two) times daily. Patient not taking: Reported on 05/18/2017 08/12/16   Vaughan Basta, MD  finasteride (PROSCAR) 5 MG tablet Take 1 tablet (5 mg total) by mouth daily. Patient not taking: Reported on 05/18/2017 08/13/16   Vaughan Basta, MD  insulin aspart (NOVOLOG) 100 UNIT/ML injection Inject 5 Units into the skin 3 (three) times daily with meals. Patient not taking: Reported on 05/18/2017 08/12/16   Vaughan Basta, MD  insulin glargine (LANTUS) 100 UNIT/ML injection Inject 0.28 mLs (28 Units total) into  the skin 2 (two) times daily. Patient not taking: Reported on 05/18/2017 08/12/16   Vaughan Basta, MD  tamsulosin (FLOMAX) 0.4 MG CAPS capsule Take 1 capsule (0.4 mg total) by mouth daily. Patient not taking: Reported on 05/18/2017 08/13/16   Vaughan Basta, MD  traMADol (ULTRAM) 50 MG tablet Take 1 tablet (50 mg total) by mouth every 6 (six) hours as needed for moderate pain. Patient not taking: Reported on 05/18/2017 08/12/16   Vaughan Basta, MD    Anti-infectives (From admission, onward)   Start     Dose/Rate Route Frequency Ordered Stop   05/19/17 1400  cephALEXin (KEFLEX) capsule 500 mg     500 mg Oral Every 8 hours 05/19/17 1130 05/26/17 1359   05/19/17 0400  vancomycin (VANCOCIN) 1,250 mg in sodium chloride 0.9 % 250 mL IVPB  Status:  Discontinued     1,250 mg 166.7 mL/hr over 90 Minutes Intravenous Every 8 hours 05/18/17 1842 05/19/17 1130   05/18/17 2200  piperacillin-tazobactam (ZOSYN) 4.5 g in dextrose 5 % 100 mL injection  Status:  Discontinued     25 mL/hr over 240 Minutes Intravenous Every 8 hours 05/18/17 1842 05/19/17 1130   05/18/17 2000  vancomycin (VANCOCIN) IVPB 1000 mg/200 mL premix     1,000 mg 200 mL/hr over 60 Minutes Intravenous  Once 05/18/17 1842 05/18/17 2215   05/18/17 1730  piperacillin-tazobactam (ZOSYN) IVPB 3.375 g     3.375 g 100 mL/hr over 30 Minutes Intravenous  Once 05/18/17 1719 05/18/17 1809   05/18/17 1730  vancomycin (VANCOCIN) IVPB 1000 mg/200 mL premix     1,000 mg 200 mL/hr over 60 Minutes Intravenous  Once 05/18/17 1719 05/18/17 1844      Scheduled Meds: . aspirin EC  81 mg Oral Daily  . [START ON 05/20/2017] atorvastatin  40 mg Oral Daily  . cephALEXin  500 mg Oral Q8H  . chlorpheniramine-HYDROcodone  5 mL Oral Q12H  . finasteride  5 mg Oral Daily  . furosemide  40 mg Intravenous Q12H  . heparin  5,000 Units Subcutaneous Q8H  . insulin aspart  0-20 Units Subcutaneous TID AC & HS  . insulin aspart  5 Units  Subcutaneous TID WC  . insulin glargine  20 Units Subcutaneous BID  . lisinopril  10 mg Oral Daily  . pregabalin  100 mg Oral TID  . tamsulosin  0.4 mg Oral Daily   Continuous Infusions: PRN Meds:.acetaminophen **OR** acetaminophen, hydrALAZINE, morphine injection, ondansetron **OR** ondansetron (ZOFRAN) IV, traMADol  Allergies  Allergen Reactions  . Shellfish Allergy Hives    Physical Exam  Vitals  Blood pressure 122/65, pulse 81,  temperature 97.8 F (36.6 C), temperature source Oral, resp. rate 16, height 5\' 11"  (1.803 m), weight (!) 177.8 kg (392 lb), SpO2 96 %.  Lower Extremity exam:  Vascular: Difficult to palpate.  As noted previous amputation to the right leg.  Dermatological: Patient has venous stasis dermatitis.  Does not appear to be significantly infected in the lower leg or ankle or foot.  2 wounds on the foot 1 at the fifth metatarsal head which is about 2 cm x 1.8 cm with minimal depth.  It is got some dry eschar may have a central area of necrosis that may or may not heal well if we get pressure off the region.  He is also got a little ulceration on the anterior portion of his foot up around the talonavicular joint but this is more superficial and does not appear to be too deep.  Uncertain as to the depth of the fifth metatarsal ulcer.  Neither of these appear to be infected.  Neurological: Diabetic peripheral neuropathy  Ortho: Previous BKA the right pressure-point to the fifth metatarsal head left likely due to the way he lays in bed with external rotation of his foot.  Data Review  CBC Recent Labs  Lab 05/18/17 1527 05/19/17 0509  WBC 8.3 9.8  HGB 15.0 13.5  HCT 47.1 41.6  PLT 200 188  MCV 80.4 79.9*  MCH 25.7* 26.0  MCHC 32.0 32.6  RDW 14.5 14.5  LYMPHSABS 0.9*  --   MONOABS 0.5  --   EOSABS 0.2  --   BASOSABS 0.0  --     ------------------------------------------------------------------------------------------------------------------  Chemistries  Recent Labs  Lab 05/18/17 1527 05/19/17 0509  NA 135 131*  K 4.4 4.3  CL 99* 94*  CO2 25 23  GLUCOSE 422* 538*  BUN 25* 30*  CREATININE 0.93 1.20  CALCIUM 9.1 8.8*  AST 24  --   ALT 18  --   ALKPHOS 119  --   BILITOT 0.6  --    ------------------------------------------------------------------------------------------------------------------ estimated creatinine clearance is 118.5 mL/min (by C-G formula based on SCr of 1.2 mg/dL). ------------------------------------------------------------------------------------------------------------------ No results for input(s): TSH, T4TOTAL, T3FREE, THYROIDAB in the last 72 hours.  Invalid input(s): FREET3 Urinalysis    Component Value Date/Time   COLORURINE YELLOW (A) 11/20/2016 1307   APPEARANCEUR CLEAR (A) 11/20/2016 1307   APPEARANCEUR Clear 08/13/2013 1624   LABSPEC 1.022 11/20/2016 1307   LABSPEC 1.012 08/13/2013 1624   PHURINE 6.0 11/20/2016 1307   GLUCOSEU >=500 (A) 11/20/2016 1307   GLUCOSEU 150 mg/dL 08/13/2013 1624   HGBUR SMALL (A) 11/20/2016 1307   BILIRUBINUR NEGATIVE 11/20/2016 1307   BILIRUBINUR Negative 08/13/2013 1624   KETONESUR 5 (A) 11/20/2016 1307   PROTEINUR NEGATIVE 11/20/2016 1307   NITRITE NEGATIVE 11/20/2016 1307   LEUKOCYTESUR NEGATIVE 11/20/2016 1307   LEUKOCYTESUR Negative 08/13/2013 1624     Imaging results:   Dg Chest 2 View  Result Date: 05/18/2017 CLINICAL DATA:  CHF, shortness of breath, cough and jaw pain for 2 weeks, diabetes mellitus, hypertension, leg swelling and infection for 2-3 months EXAM: CHEST - 2 VIEW COMPARISON:  08/09/2016 FINDINGS: Normal heart size, mediastinal contours, and pulmonary vascularity. Bronchitic changes with slight accentuation of perihilar markings. No definite pulmonary infiltrate, pleural effusion or pneumothorax. Bones  unremarkable. IMPRESSION: Mild bronchitic changes without definite infiltrate. Electronically Signed   By: Lavonia Dana M.D.   On: 05/18/2017 16:14    Assessment & Plan: Patient has 2 wounds left foot they appear to be  superficial and noninfected.  I do not think any imaging studies are called for at this point.  Would recommend better padding across the ulcerative areas.  He is ordered to have an Haematologist on by Dr. Ola Spurr which will help immensely with the peripheral swelling and dermatitis but he needs to utilize padding across the ulcer sites to prevent irritation in those regions.  Some point also think he needs a vascular evaluation.  I do not think that this is urgent at this point either.  Recommend home health put lots of padding across the fifth metatarsal ulceration and also the anterior foot ulceration whenever they change the Unna boot.  Active Problems:   Cellulitis   Family Communication: Plan discussed with patient   Albertine Patricia M.D on 05/19/2017 at 12:48 PM  Thank you for the consult, we will follow the patient with you in the Hospital.

## 2017-05-19 NOTE — Progress Notes (Signed)
Inpatient Diabetes Program Recommendations  AACE/ADA: New Consensus Statement on Inpatient Glycemic Control (2015)  Target Ranges:  Prepandial:   less than 140 mg/dL      Peak postprandial:   less than 180 mg/dL (1-2 hours)      Critically ill patients:  140 - 180 mg/dL   Lab Results  Component Value Date   GLUCAP 347 (H) 05/19/2017   HGBA1C 9.6 (H) 05/18/2017    Review of Glycemic ControlResults for ENOC, GETTER (MRN [id number]) as of 05/19/2017 11:13  Ref. Range 05/18/2017 23:30 05/19/2017 00:40 05/19/2017 03:29 05/19/2017 07:50 05/19/2017 10:58  Glucose-Capillary Latest Ref Range: 65 - 99 mg/dL >600 (HH) 548 (HH) 467 (H) 431 (H) 347 (H)    Diabetes history: Type 2 DM Outpatient Diabetes medications:  Novolog resistant tid with meals and HS, Lantus 20 units bid, Novolog 5 units tid with meals Current orders for Inpatient glycemic control:  Novolog resistant tid with meals and HS, Lantus 20 units bid  Inpatient Diabetes Program Recommendations:    Note that patient's blood sugars>500 mg/dL overnight.  Patient received IV regular insulin 20 units at 2339 and another dose of IV regular insulin 30 units at 0130.  Patient then received Regular insulin 80 units at 0550.    Spoke with patient regarding home diabetes control.  He states that he has had the best success with Regular insulin 80 units three times a day.  I asked if he takes this with meals and he states "I eat all the time".  We discussed potential basal insulin options or 70/30 and he states "Regular insulin works best for me".  I am concerned however about the safety of this regimen in the hospital since meals are only served 3 times a day.  Therefore, agree with current in hospital regimen.  Patient did receive one dose of solumedrol which likely increased blood sugar overnight.    While in the hospital, consider adding Novolog 10 units tid with meals (for meal coverage).  Will follow.   Thanks,  Adah Perl, RN,  BC-ADM Inpatient Diabetes Coordinator Pager [phone] (8a-5p)

## 2017-05-19 NOTE — Progress Notes (Signed)
[place] [facility] [place] NAME: Russell [name]    MR#:  [id number]  DATE OF BIRTH:  1965/12/26  SUBJECTIVE:   Patient upset how insulin regimen is used here in the hospital.  He tells me he uses regular insulin 80 units 3 times a day.  Sugars are still up in the 300s.  Sugars at home remains 200s-300s.  He has been noncompliant with diet and exercise. REVIEW OF SYSTEMS:   Review of Systems  Constitutional: Negative for chills, fever and weight loss.  HENT: Negative for ear discharge, ear pain and nosebleeds.   Eyes: Negative for blurred vision, pain and discharge.  Respiratory: Negative for sputum production, shortness of breath, wheezing and stridor.   Cardiovascular: Negative for chest pain, palpitations, orthopnea and PND.  Gastrointestinal: Negative for abdominal pain, diarrhea, nausea and vomiting.  Genitourinary: Negative for frequency and urgency.  Musculoskeletal: Positive for back pain and joint pain.  Neurological: Negative for sensory change, speech change, focal weakness and weakness.  Psychiatric/Behavioral: Negative for depression and hallucinations. The patient is not nervous/anxious.    Tolerating Diet:yesTolerating PT: Ambulates in the room around the bathroom DRUG ALLERGIES:   Allergies  Allergen Reactions  . Shellfish Allergy Hives    VITALS:  Blood pressure (!) 117/59, pulse 76, temperature 98 F (36.7 C), temperature source Oral, resp. rate 12, height 5\' 11"  (1.803 m), weight (!) 177.8 kg (392 lb), SpO2 95 %.  PHYSICAL EXAMINATION:   Physical Exam  GENERAL:  52 y.o.-year-old patient lying in the bed with no acute distress.  Morbidly obese EYES: Pupils equal, round, reactive to light and accommodation. No scleral icterus. Extraocular muscles intact.  HEENT: Head atraumatic, normocephalic. Oropharynx and nasopharynx clear.  NECK:  Supple, no jugular venous distention. No thyroid enlargement, no tenderness.   LUNGS: Normal breath sounds bilaterally, no wheezing, rales, rhonchi. No use of accessory muscles of respiration.  CARDIOVASCULAR: S1, S2 normal. No murmurs, rubs, or gallops.  ABDOMEN: Soft, nontender, nondistended. Bowel sounds present. No organomegaly or mass.  EXTREMITIES: right side prosthetic limb, left lower extremity chronic venous stasis changes with chronic venous/ulcers over the tibial shin with mild redness.  NEUROLOGIC: Cranial nerves II through XII are intact. No focal Motor or sensory deficits b/l.   PSYCHIATRIC:  patient is alert and oriented x 3.  SKIN: No obvious rash, lesion, or ulcer.   LABORATORY PANEL:  CBC Recent Labs  Lab 05/19/17 0509  WBC 9.8  HGB 13.5  HCT 41.6  PLT 188    Chemistries  Recent Labs  Lab 05/18/17 1527 05/19/17 0509  NA 135 131*  K 4.4 4.3  CL 99* 94*  CO2 25 23  GLUCOSE 422* 538*  BUN 25* 30*  CREATININE 0.93 1.20  CALCIUM 9.1 8.8*  AST 24  --   ALT 18  --   ALKPHOS 119  --   BILITOT 0.6  --    Cardiac Enzymes Recent Labs  Lab 05/18/17 1527  TROPONINI <0.03   RADIOLOGY:  Dg Chest 2 View  Result Date: 05/18/2017 CLINICAL DATA:  CHF, shortness of breath, cough and jaw pain for 2 weeks, diabetes mellitus, hypertension, leg swelling and infection for 2-3 months EXAM: CHEST - 2 VIEW COMPARISON:  08/09/2016 FINDINGS: Normal heart size, mediastinal contours, and pulmonary vascularity. Bronchitic changes with slight accentuation of perihilar markings. No definite pulmonary infiltrate, pleural effusion or pneumothorax. Bones unremarkable. IMPRESSION: Mild bronchitic changes without definite infiltrate. Electronically Signed   By: Elta Guadeloupe  Thornton Papas M.D.   On: 05/18/2017 16:14   ASSESSMENT AND PLAN:  Mabel Unrein  is a 52 y.o. male with a known history of uncontrolled diabetes mellitus,combined CHF with EF of 30-35%, hypertension, depression, morbidly obese, presents to the hospital secondary to cough, congestion and worsening lower  extremity edema.  1. LLE cellulitis with diabetic foot ulcerations- secondary to uncontrolled diabetes and also chronic lower extremity edema.  podiatry and ID consults -Started on vancomycin and Zosyn for now -patient states he has had an angiogram done about a year ago which was normal in that left leg. -Blood cultures are negative.  2. Acute bronchitis-already on antibiotics. Chest x-ray otherwise clear. -Cough medications added. Not needing supplemental oxygen.  3. Uncontrolled diabetes mellitus-patient does not follow dietary restrictions and also stopped taking his Levemir because he feels that it's not helping his sugars. -Started on a carb-controlled diet, diabetes coordinator consult -Started on Lantus twice a day and sliding scale insulin and pre-meal insulin and monitor. -A1c is 9.6. -Diabetes coordinator input  4. Acute on chronic combined CHF exacerbation- change lasix to IV bid for now and monitor - last ECHO as outpatient with EF 30-35% in may 2018 - start beta blocker and ARB if BP elevated  5. Paroxysmal atrial fibrillation-patient hasn't been taking his amiodarone and eliquis anyways. He seems to be in normal sinus rhythm at this time. We will hold off on this medications.  6. Hypertension-started on losartan and coreg as not taking any medications at home Monitor and adjust the doses  7. Depression-depressed and tearful about his living situation. He feels that he has no other option but living with his mom who is not happy with him.. Denies any suicidal ideation. -Patient was started on Effexor however he declines to use it  8. DVT prophylaxis-on subcutaneous heparin    Wheelchair bound at baseline Care management for discharge planning   Case discussed with Care Management/Social Worker. Management plans discussed with the patient, family and they are in agreement.  CODE STATUS: full  DVT Prophylaxis:heparin  TOTAL TIME TAKING CARE OF THIS  PATIENT: 30 minutes.  >50% time spent on counselling and coordination of care  POSSIBLE D/C IN 1-2 DAYS, DEPENDING ON CLINICAL CONDITION.  Note: This dictation was prepared with Dragon dictation along with smaller phrase technology. Any transcriptional errors that result from this process are unintentional.  Fritzi Mandes M.D on 05/19/2017 at 2:32 PM  Between 7am to 6pm - Pager - [phone]  After 6pm go to [email or web address] - password [email or web address] [facility]  Office  [phone]  CC: Primary care physician; System, Pcp Not InPatient ID: TAMERON LAMA, male   DOB: 1965/05/23, 53 y.o.   MRN: [id number]

## 2017-05-20 LAB — GLUCOSE, CAPILLARY
GLUCOSE-CAPILLARY: 328 mg/dL — AB (ref 65–99)
GLUCOSE-CAPILLARY: 287 mg/dL — AB (ref 65–99)
Glucose-Capillary: 266 mg/dL — ABNORMAL HIGH (ref 65–99)

## 2017-05-20 MED ORDER — CEPHALEXIN 500 MG PO CAPS
500.0000 mg | ORAL_CAPSULE | Freq: Three times a day (TID) | ORAL | 0 refills | Status: DC
Start: 2017-05-20 — End: 2018-02-01

## 2017-05-20 MED ORDER — INSULIN GLARGINE 100 UNIT/ML ~~LOC~~ SOLN
25.0000 [IU] | Freq: Two times a day (BID) | SUBCUTANEOUS | Status: DC
  Filled 2017-05-20 (×2): qty 0.25

## 2017-05-20 MED ORDER — MAGNESIUM CITRATE PO SOLN
1.0000 | Freq: Once | ORAL | Status: DC
  Filled 2017-05-20: qty 296

## 2017-05-20 MED ORDER — GUAIFENESIN-DM 100-10 MG/5ML PO SYRP
15.0000 mL | ORAL_SOLUTION | ORAL | Status: DC | PRN
  Administered 2017-05-20: 15 mL via ORAL
  Filled 2017-05-20: qty 15

## 2017-05-20 MED ORDER — INSULIN ASPART 100 UNIT/ML ~~LOC~~ SOLN
15.0000 [IU] | Freq: Three times a day (TID) | SUBCUTANEOUS | Status: DC
  Administered 2017-05-20: 15 [IU] via SUBCUTANEOUS
  Filled 2017-05-20: qty 1

## 2017-05-20 NOTE — Care Management Important Message (Signed)
Important Message  Patient Details  Name: Russell [name] MRN: [id number] Date of Birth: Feb 05, 1966   Medicare Important Message Given:  Yes    Beverly Sessions, RN 05/20/2017, 11:36 AM

## 2017-05-20 NOTE — Clinical Social Work Note (Signed)
CSW consulted for skilled nursing home placement. Patient is ambulatory in room with his prosthesis. Patient will be discharging home with home health services is he allows it. RN CM spoke with patient and he stated his mother does not like anyone coming to the home. Shela Leff MSW,LCSW [phone]

## 2017-05-20 NOTE — Progress Notes (Signed)
Russell [name] to be D/C'd home per MD order.  Discussed prescriptions and follow up appointments with the patient. Prescriptions given to patient, medication list explained in detail. Pt verbalized understanding.  Allergies as of 05/20/2017      Reactions   Shellfish Allergy Hives      Medication List    STOP taking these medications   amiodarone 400 MG tablet Commonly known as:  PACERONE   apixaban 5 MG Tabs tablet Commonly known as:  ELIQUIS   finasteride 5 MG tablet Commonly known as:  PROSCAR   insulin aspart 100 UNIT/ML injection Commonly known as:  novoLOG   insulin glargine 100 UNIT/ML injection Commonly known as:  LANTUS   tamsulosin 0.4 MG Caps capsule Commonly known as:  FLOMAX   traMADol 50 MG tablet Commonly known as:  ULTRAM     TAKE these medications   atorvastatin 40 MG tablet Commonly known as:  LIPITOR Take 40 mg by mouth daily.   cephALEXin 500 MG capsule Commonly known as:  KEFLEX Take 1 capsule (500 mg total) by mouth every 8 (eight) hours.   furosemide 40 MG tablet Commonly known as:  LASIX Take 1 tablet (40 mg total) by mouth daily.   insulin regular 100 units/mL injection Commonly known as:  NOVOLIN R,HUMULIN R Inject 40-100 Units into the skin 3 (three) times daily before meals.   lisinopril 10 MG tablet Commonly known as:  PRINIVIL,ZESTRIL Take 10 mg by mouth daily.   pregabalin 100 MG capsule Commonly known as:  LYRICA Take 100 mg by mouth 3 (three) times daily.       Vitals:   05/20/17 0526 05/20/17 1143  BP: 115/70 (!) 106/50  Pulse: 65 66  Resp: 18 14  Temp: 97.7 F (36.5 C) 97.7 F (36.5 C)  SpO2: 97% 98%    Skin clean, dry and intact without evidence of skin break down, no evidence of skin tears noted. IV catheter discontinued intact. Site without signs and symptoms of complications. Dressing and pressure applied. Pt denies pain at this time. No complaints noted.  An After Visit Summary was printed and given to  the patient. Patient escorted via [place], and D/C home via private auto.  Russell [name]

## 2017-05-20 NOTE — Progress Notes (Signed)
Inpatient Diabetes Program Recommendations  AACE/ADA: New Consensus Statement on Inpatient Glycemic Control (2015)  Target Ranges:  Prepandial:   less than 140 mg/dL      Peak postprandial:   less than 180 mg/dL (1-2 hours)      Critically ill patients:  140 - 180 mg/dL   Lab Results  Component Value Date   GLUCAP 287 (H) 05/20/2017   HGBA1C 9.6 (H) 05/18/2017    Review of Glycemic ControlResults for [name], [name] (MRN [id number]) as of 05/20/2017 10:07  Ref. Range 05/19/2017 10:58 05/19/2017 16:56 05/19/2017 21:52 05/20/2017 03:13 05/20/2017 07:43  Glucose-Capillary Latest Ref Range: 65 - 99 mg/dL 347 (H) 286 (H) 357 (H) 328 (H) 287 (H)   Diabetes history: Type 2 DM Outpatient Diabetes medications: Novolin R 80 units tid with meals Current orders for Inpatient glycemic control:  Novolog resistant tid with meals and HS, Lantus 20 units bid, Novolog 10 units tid with meals Inpatient Diabetes Program Recommendations:    Please consider increasing Lantus to 25 units bid and increase Novolog meal coverage to 15 units tid with meals.  Note that patient will likely want to resume home DM medication regimen at d/c.    Thanks,  Adah Perl, RN, BC-ADM Inpatient Diabetes Coordinator Pager [phone]

## 2017-05-20 NOTE — Progress Notes (Signed)
PT Cancellation Note  Patient Details Name: Russell [name] MRN: [id number] DOB: 09-Mar-1965   Cancelled Treatment:    Reason Eval/Treat Not Completed: PT screened, no needs identified, will sign off(Consult received and chart reviewed.  Per primary RN, patient has R LE prosthesis in room; able to indep manage prosthesis and mobilize around room enviroment without difficulty.  Will complete PT order at this time; please re-consult should needs change)   Soraiya Ahner H. Owens Shark, PT, DPT, NCS 05/20/17, 11:26 AM [phone]

## 2017-05-20 NOTE — Discharge Summary (Signed)
[facility] [place] NAME: Russell [name]    MR#:  [id number]  DATE OF BIRTH:  06-28-1965  DATE OF ADMISSION:  05/18/2017 ADMITTING PHYSICIAN: Gladstone Lighter, MD  DATE OF DISCHARGE: 05/20/2017  PRIMARY CARE PHYSICIAN: System, Pcp Not In    ADMISSION DIAGNOSIS:  Bronchitis [J40] Cellulitis of left lower extremity [X91.478]  DISCHARGE DIAGNOSIS:  1.  Left lower extremity cellulitis 2.  Uncontrolled diabetes 3 paroxysmal A. Fib 4 hypertension  SECONDARY DIAGNOSIS:   Past Medical History:  Diagnosis Date  . CHF (congestive heart failure) (HCC)    EF 30-35%  . Depression   . Diabetes mellitus without complication ([place])   . Hyperlipemia   . Hypertension     HOSPITAL COURSE:   JamesParrishis a57 y.o.malewith a known history of uncontrolled diabetes mellitus,combined CHF with EF of 30-35%, hypertension, depression, morbidly obese, presents to the hospital secondary to cough, congestion and worsening lower extremity edema.  1. LLE cellulitis with diabetic foot ulcerations- secondary to uncontrolled diabetes and also chronic lower extremity edema.  podiatry and ID consults--appreciated -Started on vancomycin and Zosyn for now---change to oral Keflex -patient states he has had an angiogram done about a year ago which was normal in that left leg. -Blood cultures are negative.  2. Acute bronchitis-already on antibiotics. Chest x-ray otherwise clear. -Cough medications added. Not needing supplemental oxygen.  3. Uncontrolled diabetes mellitus-patient does not follow dietary restrictions and also stopped taking his Levemir because he feels that it's not helping his sugars. -Started on a carb-controlled diet, diabetes coordinator consult -Started on Lantus twice a day and sliding scale insulin and pre-meal insulin and monitor. -A1c is 9.6. -Diabetes coordinator input -Patient is very adamant on using his 80 units regular  insulin 3 times a day.  4. Acute on chronic combined CHF exacerbation- change lasix to IV bid--- changed to oral Lasix for now and monitor - last ECHO as outpatient with EF 30-35% in may 2018  5. Paroxysmal atrial fibrillation-patient hasn't been taking his amiodarone and eliquis anyways. He seems to be in normal sinus rhythm at this time. We will hold off on this medications.  6. Hypertension-started on losartan and coreg as not taking any medications at home Monitor and adjust the doses  7. DVT prophylaxis-on subcutaneous heparin    Patient uses electric scooter for transportation.  He has a prosthetic limb he ambulates in the room Overall appears at baseline discharge to home.  CONSULTS OBTAINED:  Treatment Team:  Albertine Patricia, DPM Leonel Ramsay, MD  DRUG ALLERGIES:   Allergies  Allergen Reactions  . Shellfish Allergy Hives    DISCHARGE MEDICATIONS:   Allergies as of 05/20/2017      Reactions   Shellfish Allergy Hives      Medication List    STOP taking these medications   amiodarone 400 MG tablet Commonly known as:  PACERONE   apixaban 5 MG Tabs tablet Commonly known as:  ELIQUIS   finasteride 5 MG tablet Commonly known as:  PROSCAR   insulin aspart 100 UNIT/ML injection Commonly known as:  novoLOG   insulin glargine 100 UNIT/ML injection Commonly known as:  LANTUS   tamsulosin 0.4 MG Caps capsule Commonly known as:  FLOMAX   traMADol 50 MG tablet Commonly known as:  ULTRAM     TAKE these medications   atorvastatin 40 MG tablet Commonly known as:  LIPITOR Take 40 mg by mouth daily.   cephALEXin 500 MG capsule  Commonly known as:  KEFLEX Take 1 capsule (500 mg total) by mouth every 8 (eight) hours.   furosemide 40 MG tablet Commonly known as:  LASIX Take 1 tablet (40 mg total) by mouth daily.   insulin regular 100 units/mL injection Commonly known as:  NOVOLIN R,HUMULIN R Inject 40-100 Units into the skin 3 (three) times  daily before meals.   lisinopril 10 MG tablet Commonly known as:  PRINIVIL,ZESTRIL Take 10 mg by mouth daily.   pregabalin 100 MG capsule Commonly known as:  LYRICA Take 100 mg by mouth 3 (three) times daily.       If you experience worsening of your admission symptoms, develop shortness of breath, life threatening emergency, suicidal or homicidal thoughts you must seek medical attention immediately by calling 911 or calling your MD immediately  if symptoms less severe.  You Must read complete instructions/literature along with all the possible adverse reactions/side effects for all the Medicines you take and that have been prescribed to you. Take any new Medicines after you have completely understood and accept all the possible adverse reactions/side effects.   Please note  You were cared for by a hospitalist during your hospital stay. If you have any questions about your discharge medications or the care you received while you were in the hospital after you are discharged, you can call the unit and asked to speak with the hospitalist on call if the hospitalist that took care of you is not available. Once you are discharged, your primary care physician will handle any further medical issues. Please note that NO REFILLS for any discharge medications will be authorized once you are discharged, as it is imperative that you return to your primary care physician (or establish a relationship with a primary care physician if you do not have one) for your aftercare needs so that they can reassess your need for medications and monitor your lab values. Today   SUBJECTIVE  Mild cough   VITAL SIGNS:  Blood pressure (!) 106/50, pulse 66, temperature 97.7 F (36.5 C), temperature source Oral, resp. rate 14, height 5\' 11"  (1.803 m), weight (!) 153.7 kg (338 lb 13.6 oz), SpO2 98 %.  I/O:  No intake or output data in the 24 hours ending 05/20/17 1509  PHYSICAL EXAMINATION:  GENERAL:  52  y.o.-year-old patient lying in the bed with no acute distress.  Orbitally obese EYES: Pupils equal, round, reactive to light and accommodation. No scleral icterus. Extraocular muscles intact.  HEENT: Head atraumatic, normocephalic. Oropharynx and nasopharynx clear.  NECK:  Supple, no jugular venous distention. No thyroid enlargement, no tenderness.  LUNGS: Normal breath sounds bilaterally, no wheezing, rales,rhonchi or crepitation. No use of accessory muscles of respiration.  CARDIOVASCULAR: S1, S2 normal. No murmurs, rubs, or gallops.  ABDOMEN: Soft, non-tender, non-distended. Bowel sounds present. No organomegaly or mass.  EXTREMITIES: No pedal edema, cyanosis, or clubbing.  Lower extremity chronic venous stasis changes with Unna boot present.  Right prosthetic limb present NEUROLOGIC: Cranial nerves II through XII are intact. Muscle strength 5/5 in all extremities. Sensation intact. Gait not checked.  PSYCHIATRIC: The patient is alert and oriented x 3.  SKIN: No obvious rash, lesion, or ulcer.   DATA REVIEW:   CBC  Recent Labs  Lab 05/19/17 0509  WBC 9.8  HGB 13.5  HCT 41.6  PLT 188    Chemistries  Recent Labs  Lab 05/18/17 1527 05/19/17 0509  NA 135 131*  K 4.4 4.3  CL 99* 94*  CO2 25 23  GLUCOSE 422* 538*  BUN 25* 30*  CREATININE 0.93 1.20  CALCIUM 9.1 8.8*  AST 24  --   ALT 18  --   ALKPHOS 119  --   BILITOT 0.6  --     Microbiology Results   No results found for this or any previous visit (from the past 240 hour(s)).  RADIOLOGY:  Dg Chest 2 View  Result Date: 05/18/2017 CLINICAL DATA:  CHF, shortness of breath, cough and jaw pain for 2 weeks, diabetes mellitus, hypertension, leg swelling and infection for 2-3 months EXAM: CHEST - 2 VIEW COMPARISON:  08/09/2016 FINDINGS: Normal heart size, mediastinal contours, and pulmonary vascularity. Bronchitic changes with slight accentuation of perihilar markings. No definite pulmonary infiltrate, pleural effusion or  pneumothorax. Bones unremarkable. IMPRESSION: Mild bronchitic changes without definite infiltrate. Electronically Signed   By: Lavonia Dana M.D.   On: 05/18/2017 16:14     Management plans discussed with the patient, family and they are in agreement.  CODE STATUS:     Code Status Orders  (From admission, onward)        Start     Ordered   05/18/17 1958  Full code  Continuous     05/18/17 1957    Code Status History    Date Active Date Inactive Code Status Order ID Comments User Context   08/08/2016 2114 08/12/2016 2151 Full Code [id number]  Idelle Crouch, MD Inpatient      TOTAL TIME TAKING CARE OF THIS PATIENT: **40* minutes.    Fritzi Mandes M.D on 05/20/2017 at 3:09 PM  Between 7am to 6pm - Pager - [phone] After 6pm go to [email or web address] - password [email or web address] [facility]  Office  [phone]  CC: Primary care physician; System, Pcp Not In

## 2017-05-20 NOTE — Progress Notes (Signed)
[facility] Podiatry                                                      Patient Demographics  Russell [name], is a 52 y.o. male   MRN: [id number]   DOB - [birth year]  Admit Date - 05/18/2017    Outpatient Primary MD for the patient is System, Pcp Not In  Consult requested in the Hospital by Fritzi Mandes, MD, On 05/20/2017    With History of -  Past Medical History:  Diagnosis Date  . CHF (congestive heart failure) (HCC)    EF 30-35%  . Depression   . Diabetes mellitus without complication ([place])   . Hyperlipemia   . Hypertension       Past Surgical History:  Procedure Laterality Date  . APPENDECTOMY    . BELOW KNEE LEG AMPUTATION     Right leg  . TONSILLECTOMY      in for   Chief Complaint  Patient presents with  . Cough  . Leg Swelling     HPI  Russell [name]  is a 52 y.o. male, In hospital for cellulitis and uncontrolled  Glucose.  He was seen by ID an felt had no infectious process.  Superficial wounds to left foot 5th met head area and top of foot   Social History Social History   Tobacco Use  . Smoking status: Never Smoker  . Smokeless tobacco: Never Used  Substance Use Topics  . Alcohol use: No    Family History Family History  Problem Relation Age of Onset  . COPD Mother   . Diabetes Father   . CAD Father   . Kidney cancer Brother     Prior to Admission medications   Medication Sig Start Date End Date Taking? Authorizing Provider  atorvastatin (LIPITOR) 40 MG tablet Take 40 mg by mouth daily.    Yes [provider]  furosemide (LASIX) 40 MG tablet Take 1 tablet (40 mg total) by mouth daily. 08/12/16  Yes Vaughan Basta, MD  insulin regular (NOVOLIN R,HUMULIN R) 100 units/mL injection Inject 40-100 Units into the skin 3 (three) times daily before meals.   Yes [provider]  lisinopril  (PRINIVIL,ZESTRIL) 10 MG tablet Take 10 mg by mouth daily.   Yes [provider]  pregabalin (LYRICA) 100 MG capsule Take 100 mg by mouth 3 (three) times daily.   Yes [provider]  amiodarone (PACERONE) 400 MG tablet Take 1 tablet (400 mg total) by mouth daily. Patient not taking: Reported on 05/18/2017 08/13/16   Vaughan Basta, MD  apixaban (ELIQUIS) 5 MG TABS tablet Take 1 tablet (5 mg total) by mouth 2 (two) times daily. Patient not taking: Reported on 05/18/2017 08/12/16   Vaughan Basta, MD  finasteride (PROSCAR) 5 MG tablet Take 1 tablet (5 mg total) by mouth daily. Patient not taking: Reported on 05/18/2017 08/13/16   Vaughan Basta, MD  insulin aspart (NOVOLOG) 100 UNIT/ML injection Inject 5 Units into the skin 3 (three) times daily with meals. Patient not taking: Reported on 05/18/2017 08/12/16   Vaughan Basta, MD  insulin glargine (LANTUS) 100 UNIT/ML injection Inject 0.28 mLs (28 Units total) into the skin 2 (two) times daily. Patient not taking: Reported on 05/18/2017 08/12/16   Vaughan Basta, MD  tamsulosin (FLOMAX) 0.4 MG CAPS capsule Take  1 capsule (0.4 mg total) by mouth daily. Patient not taking: Reported on 05/18/2017 08/13/16   Vaughan Basta, MD  traMADol (ULTRAM) 50 MG tablet Take 1 tablet (50 mg total) by mouth every 6 (six) hours as needed for moderate pain. Patient not taking: Reported on 05/18/2017 08/12/16   Vaughan Basta, MD    Anti-infectives (From admission, onward)   Start     Dose/Rate Route Frequency Ordered Stop   05/19/17 1400  cephALEXin (KEFLEX) capsule 500 mg     500 mg Oral Every 8 hours 05/19/17 1130 05/26/17 1359   05/19/17 0400  vancomycin (VANCOCIN) 1,250 mg in sodium chloride 0.9 % 250 mL IVPB  Status:  Discontinued     1,250 mg 166.7 mL/hr over 90 Minutes Intravenous Every 8 hours 05/18/17 1842 05/19/17 1130   05/18/17 2200  piperacillin-tazobactam (ZOSYN) 4.5 g in dextrose 5 % 100  mL injection  Status:  Discontinued     25 mL/hr over 240 Minutes Intravenous Every 8 hours 05/18/17 1842 05/19/17 1130   05/18/17 2000  vancomycin (VANCOCIN) IVPB 1000 mg/200 mL premix     1,000 mg 200 mL/hr over 60 Minutes Intravenous  Once 05/18/17 1842 05/18/17 2215   05/18/17 1730  piperacillin-tazobactam (ZOSYN) IVPB 3.375 g     3.375 g 100 mL/hr over 30 Minutes Intravenous  Once 05/18/17 1719 05/18/17 1809   05/18/17 1730  vancomycin (VANCOCIN) IVPB 1000 mg/200 mL premix     1,000 mg 200 mL/hr over 60 Minutes Intravenous  Once 05/18/17 1719 05/18/17 1844      Scheduled Meds: . aspirin EC  81 mg Oral Daily  . atorvastatin  40 mg Oral Daily  . cephALEXin  500 mg Oral Q8H  . chlorpheniramine-HYDROcodone  5 mL Oral Q12H  . finasteride  5 mg Oral Daily  . furosemide  40 mg Intravenous Q12H  . heparin  5,000 Units Subcutaneous Q8H  . insulin aspart  0-20 Units Subcutaneous TID AC & HS  . insulin aspart  15 Units Subcutaneous TID WC  . insulin glargine  25 Units Subcutaneous BID  . lisinopril  10 mg Oral Daily  . polyethylene glycol  17 g Oral Daily  . pregabalin  100 mg Oral TID  . tamsulosin  0.4 mg Oral Daily   Continuous Infusions: PRN Meds:.acetaminophen **OR** acetaminophen, guaiFENesin-dextromethorphan, hydrALAZINE, morphine injection, ondansetron **OR** ondansetron (ZOFRAN) IV, traMADol  Allergies  Allergen Reactions  . Shellfish Allergy Hives    Physical Exam  Vitals  Blood pressure (!) 106/50, pulse 66, temperature 97.7 F (36.5 C), temperature source Oral, resp. rate 14, height _0  (1.803 m), weight (!) 153.7 kg (338 lb 13.6 oz), SpO2 98 %.  Lower Extremity exam:  Vascular:Pt states he has had recent vascular testing and was ok.Does have chronic venous stasis and skin changes Dermatological:Mostly superficial wounds to above mentioned areas.  No sign of infection to regions.Unna boot intact for stasis control  Neurological:Diabetic  neuropathy. CBC Recent Labs  Lab 05/18/17 1527 05/19/17 0509  WBC 8.3 9.8  HGB 15.0 13.5  HCT 47.1 41.6  PLT 200 188  MCV 80.4 79.9*  MCH 25.7* 26.0  MCHC 32.0 32.6  RDW 14.5 14.5  LYMPHSABS 0.9*  --   MONOABS 0.5  --   EOSABS 0.2  --   BASOSABS 0.0  --    ------------------------------------------------------------------------------------------------------------------  Chemistries  Recent Labs  Lab 05/18/17 1527 05/19/17 0509  NA 135 131*  K 4.4 4.3  CL 99* 94*  CO2 25  23  GLUCOSE 422* 538*  BUN 25* 30*  CREATININE 0.93 1.20  CALCIUM 9.1 8.8*  AST 24  --   ALT 18  --   ALKPHOS 119  --   BILITOT 0.6  --    ------------------------------------------------------------------------------------------------------------------ estimated creatinine clearance is 108.7 mL/min (by C-G formula based on SCr of 1.2 mg/dL). ------------------------------------------------------------------------------------------------------------------ No results for input(s): TSH, T4TOTAL, T3FREE, THYROIDAB in the last 72 hours.  Invalid input(s): FREET3 Urinalysis    Component Value Date/Time   COLORURINE YELLOW (A) 11/20/2016 1307   APPEARANCEUR CLEAR (A) 11/20/2016 1307   APPEARANCEUR Clear 08/13/2013 1624   LABSPEC 1.022 11/20/2016 1307   LABSPEC 1.012 08/13/2013 1624   PHURINE 6.0 11/20/2016 1307   GLUCOSEU >=500 (A) 11/20/2016 1307   GLUCOSEU 150 mg/dL 08/13/2013 1624   HGBUR SMALL (A) 11/20/2016 1307   BILIRUBINUR NEGATIVE 11/20/2016 1307   BILIRUBINUR Negative 08/13/2013 1624   KETONESUR 5 (A) 11/20/2016 1307   PROTEINUR NEGATIVE 11/20/2016 1307   NITRITE NEGATIVE 11/20/2016 1307   LEUKOCYTESUR NEGATIVE 11/20/2016 1307   LEUKOCYTESUR Negative 08/13/2013 1624     Imaging results:   Dg Chest 2 View  Result Date: 05/18/2017 CLINICAL DATA:  CHF, shortness of breath, cough and jaw pain for 2 weeks, diabetes mellitus, hypertension, leg swelling and infection for 2-3 months  EXAM: CHEST - 2 VIEW COMPARISON:  08/09/2016 FINDINGS: Normal heart size, mediastinal contours, and pulmonary vascularity. Bronchitic changes with slight accentuation of perihilar markings. No definite pulmonary infiltrate, pleural effusion or pneumothorax. Bones unremarkable. IMPRESSION: Mild bronchitic changes without definite infiltrate. Electronically Signed   By: Lavonia Dana M.D.   On: 05/18/2017 16:14    Assessment & Plan.  Leave unna boot intact.  I did cut the boot at the area of the 5th met head to reduce pressure on the region.  Recommend he follow with [facility].  Active Problems:   Cellulitis   Family Communication: Plan discussed with patient   Albertine Patricia M.D on 05/20/2017 at 12:53 PM  Thank you for the consult, we will follow the patient with you in the Hospital.

## 2017-05-30 ENCOUNTER — Other Ambulatory Visit
Admission: RE | Admit: 2017-05-30 | Discharge: 2017-05-30 | Disposition: A | Discharge status: Home / Self Care | Payer: Medicare Other | Source: Ambulatory Visit | Attending: Physician Assistant | Admitting: Physician Assistant

## 2017-05-30 ENCOUNTER — Encounter: Payer: Medicare Other | Attending: Physician Assistant | Admitting: Physician Assistant

## 2017-05-30 DIAGNOSIS — B999 Unspecified infectious disease: Secondary | ICD-10-CM | POA: Diagnosis present

## 2017-05-30 DIAGNOSIS — I509 Heart failure, unspecified: Secondary | ICD-10-CM | POA: Diagnosis not present

## 2017-05-30 DIAGNOSIS — I110 Hypertensive heart disease with heart failure: Secondary | ICD-10-CM | POA: Diagnosis not present

## 2017-05-30 DIAGNOSIS — I480 Paroxysmal atrial fibrillation: Secondary | ICD-10-CM | POA: Diagnosis not present

## 2017-05-30 DIAGNOSIS — G4730 Sleep apnea, unspecified: Secondary | ICD-10-CM | POA: Diagnosis not present

## 2017-05-30 DIAGNOSIS — L97812 Non-pressure chronic ulcer of other part of right lower leg with fat layer exposed: Secondary | ICD-10-CM | POA: Diagnosis not present

## 2017-05-30 DIAGNOSIS — Z6841 Body Mass Index (BMI) 40.0 and over, adult: Secondary | ICD-10-CM | POA: Diagnosis not present

## 2017-05-30 DIAGNOSIS — F0390 Unspecified dementia without behavioral disturbance: Secondary | ICD-10-CM | POA: Insufficient documentation

## 2017-05-30 DIAGNOSIS — F4321 Adjustment disorder with depressed mood: Secondary | ICD-10-CM | POA: Insufficient documentation

## 2017-05-30 DIAGNOSIS — E10621 Type 1 diabetes mellitus with foot ulcer: Secondary | ICD-10-CM | POA: Insufficient documentation

## 2017-05-30 DIAGNOSIS — L97522 Non-pressure chronic ulcer of other part of left foot with fat layer exposed: Secondary | ICD-10-CM | POA: Diagnosis not present

## 2017-05-30 DIAGNOSIS — L03116 Cellulitis of left lower limb: Secondary | ICD-10-CM | POA: Diagnosis not present

## 2017-05-30 DIAGNOSIS — L97822 Non-pressure chronic ulcer of other part of left lower leg with fat layer exposed: Secondary | ICD-10-CM | POA: Insufficient documentation

## 2017-05-30 DIAGNOSIS — E10622 Type 1 diabetes mellitus with other skin ulcer: Secondary | ICD-10-CM | POA: Diagnosis not present

## 2017-06-01 LAB — AEROBIC CULTURE W GRAM STAIN (SUPERFICIAL SPECIMEN)

## 2017-06-01 LAB — AEROBIC CULTURE  (SUPERFICIAL SPECIMEN)

## 2017-06-03 NOTE — Progress Notes (Signed)
DAVARION, CUFFEE ([id number]) Visit Report for 05/30/2017 Abuse/Suicide Risk Screen Details Patient Name: Russell [name], Russell [name] Date of Service: 05/30/2017 12:30 PM Medical Record Number: [id number] Patient Account Number: [id number] Date of Birth/Sex: 03-17-1965 (51 y.o. Male) Treating RN: Ahmed Prima Primary Care Ondra Deboard: Johny Drilling Other Clinician: Referring Amela Handley: Gladstone Lighter Treating Aarti Mankowski/Extender: Melburn Hake, HOYT Weeks in Treatment: 0 Abuse/Suicide Risk Screen Items Answer ABUSE/SUICIDE RISK SCREEN: Has anyone close to you tried to hurt or harm you recentlyo No Do you feel uncomfortable with anyone in your familyo No Has anyone forced you do things that you didnot want to doo No Do you have any thoughts of harming yourselfo No Patient displays signs or symptoms of abuse and/or neglect. No Electronic Signature(s) Signed: 06/02/2017 4:32:30 PM By: Alric Quan Entered By: Alric Quan on 05/30/2017 12:47:50 Gugel, [name] ([id number]) -------------------------------------------------------------------------------- Activities of Daily Living Details Patient Name: Russell [name] Date of Service: 05/30/2017 12:30 PM Medical Record Number: [id number] Patient Account Number: [id number] Date of Birth/Sex: [birth year] (52 y.o. Male) Treating RN: Ahmed Prima Primary Care Lyvonne Cassell: Johny Drilling Other Clinician: Referring Nathan Moctezuma: Gladstone Lighter Treating Keili Hasten/Extender: Melburn Hake, HOYT Weeks in Treatment: 0 Activities of Daily Living Items Answer Activities of Daily Living (Please select one for each item) Drive Automobile Completely Able Take Medications Completely Able Use Telephone Completely [place] for Appearance Completely Able Use Toilet Completely Able Bath / Shower Completely Able Dress Self Completely Able Feed Self Completely Able Walk Completely Able Get In / Out Bed Completely Able Housework Completely Able Prepare Meals  Completely [place] for Self Completely Able Electronic Signature(s) Signed: 06/02/2017 4:32:30 PM By: Alric Quan Entered By: Alric Quan on 05/30/2017 12:48:38 [place], [name] ([id number]) -------------------------------------------------------------------------------- Education Assessment Details Patient Name: Russell [name] Date of Service: 05/30/2017 12:30 PM Medical Record Number: [id number] Patient Account Number: [id number] Date of Birth/Sex: 1966-02-09 (52 y.o. Male) Treating RN: Ahmed Prima Primary Care Mahogani Holohan: Johny Drilling Other Clinician: Referring Jan Olano: Gladstone Lighter Treating Belisa Eichholz/Extender: Melburn Hake, HOYT Weeks in Treatment: 0 Primary Learner Assessed: Patient Learning Preferences/Education Level/Primary Language Learning Preference: Explanation, Printed Material Highest Education Level: High School Preferred Language: English Cognitive Barrier Assessment/Beliefs Language Barrier: No Translator Needed: No Memory Deficit: No Emotional Barrier: No Cultural/Religious Beliefs Affecting Medical Care: No Physical Barrier Assessment Impaired Vision: No Impaired Hearing: No Decreased Hand dexterity: No Knowledge/Comprehension Assessment Knowledge Level: Medium Comprehension Level: Medium Ability to understand written Medium instructions: Ability to understand verbal Medium instructions: Motivation Assessment Anxiety Level: Calm Cooperation: Cooperative Education Importance: Acknowledges Need Interest in Health Problems: Asks Questions Perception: Coherent Willingness to Engage in Self- Medium Management Activities: Readiness to Engage in Self- Medium Management Activities: Electronic Signature(s) Signed: 06/02/2017 4:32:30 PM By: Alric Quan Entered By: Alric Quan on 05/30/2017 12:49:00 Herd, [name]  ([id number]) -------------------------------------------------------------------------------- Fall Risk Assessment Details Patient Name: Russell [name] Date of Service: 05/30/2017 12:30 PM Medical Record Number: [id number] Patient Account Number: [id number] Date of Birth/Sex: 26-Jan-1966 (52 y.o. Male) Treating RN: Ahmed Prima Primary Care Roey Coopman: Johny Drilling Other Clinician: Referring Gery Sabedra: Gladstone Lighter Treating Meria Crilly/Extender: Melburn Hake, HOYT Weeks in Treatment: 0 Fall Risk Assessment Items Have you had 2 or more falls in the last 12 monthso 0 No Have you had any fall that resulted in injury in the last 12 monthso 0 No FALL RISK ASSESSMENT: History of falling - immediate or within 3 months 0 No Secondary diagnosis 15 Yes Ambulatory aid None/bed rest/wheelchair/nurse 0 No Crutches/cane/walker 0 No Furniture  0 No IV Access/Saline Lock 0 No Gait/Training Normal/bed rest/immobile 0 No Weak 0 No Impaired 20 Yes Mental Status Oriented to own ability 0 Yes Electronic Signature(s) Signed: 06/02/2017 4:32:30 PM By: Alric Quan Entered By: Alric Quan on 05/30/2017 12:49:14 [place], [name] ([id number]) -------------------------------------------------------------------------------- Foot Assessment Details Patient Name: Russell [name] Date of Service: 05/30/2017 12:30 PM Medical Record Number: [id number] Patient Account Number: [id number] Date of Birth/Sex: December 01, 1965 (52 y.o. Male) Treating RN: Ahmed Prima Primary Care Shunda Rabadi: Johny Drilling Other Clinician: Referring Shiven Junious: Gladstone Lighter Treating Davionna Blacksher/Extender: Melburn Hake, HOYT Weeks in Treatment: 0 Foot Assessment Items Site Locations + = Sensation present, - = Sensation absent, C = Callus, U = Ulcer R = Redness, W = Warmth, M = Maceration, PU = Pre-ulcerative lesion F = Fissure, S = Swelling, D = Dryness Assessment Right: Left: Other Deformity: No No Prior Foot Ulcer: No No Prior  Amputation: No No Charcot Joint: No No Ambulatory Status: Ambulatory Without Help Gait: Steady Electronic Signature(s) Signed: 06/02/2017 4:32:30 PM By: Alric Quan Entered By: Alric Quan on 05/30/2017 12:56:21 Brummet, [name] ([id number]) -------------------------------------------------------------------------------- Nutrition Risk Assessment Details Patient Name: Russell [name] Date of Service: 05/30/2017 12:30 PM Medical Record Number: [id number] Patient Account Number: [id number] Date of Birth/Sex: 01/03/1966 (52 y.o. Male) Treating RN: Ahmed Prima Primary Care Irys Nigh: Johny Drilling Other Clinician: Referring Marvell Tamer: Gladstone Lighter Treating Emmalie Haigh/Extender: STONE III, HOYT Weeks in Treatment: 0 Height (in): 71 Weight (lbs): 389.6 Body Mass Index (BMI): 54.3 Nutrition Risk Assessment Items NUTRITION RISK SCREEN: I have an illness or condition that made me change the kind and/or amount of 0 No food I eat I eat fewer than two meals per day 0 No I eat few fruits and vegetables, or milk products 0 No I have three or more drinks of beer, liquor or wine almost every day 0 No I have tooth or mouth problems that make it hard for me to eat 0 No I don't always have enough money to buy the food I need 0 No I eat alone most of the time 0 No I take three or more different prescribed or over-the-counter drugs a day 1 Yes Without wanting to, I have lost or gained 10 pounds in the last six months 0 No I am not always physically able to shop, cook and/or feed myself 0 No Nutrition Protocols Good Risk Protocol Moderate Risk Protocol Notes pt states that he eats too much Electronic Signature(s) Signed: 06/02/2017 4:32:30 PM By: Alric Quan Entered By: Alric Quan on 05/30/2017 12:49:35

## 2017-06-03 NOTE — Progress Notes (Signed)
CAROLL, CUNNINGTON ([id number]) Visit Report for 05/30/2017 Chief Complaint Document Details Patient Name: Russell [name], Russell [name] Date of Service: 05/30/2017 12:30 PM Medical Record Number: [id number] Patient Account Number: [id number] Date of Birth/Sex: [birth year] (52 y.o. Male) Treating RN: Roger Shelter Primary Care Provider: Johny Drilling Other Clinician: Referring Provider: Gladstone Lighter Treating Provider/Extender: Melburn Hake,  Weeks in Treatment: 0 Information Obtained from: Patient Chief Complaint Multiple LE Ulcers with associated cellulitis Electronic Signature(s) Signed: 05/31/2017 8:40:52 AM By: Worthy Keeler PA-C Entered By: Worthy Keeler on 05/30/2017 17:36:24 [place], [name] ([id number]) -------------------------------------------------------------------------------- HPI Details Patient Name: Russell [name] Date of Service: 05/30/2017 12:30 PM Medical Record Number: [id number] Patient Account Number: [id number] Date of Birth/Sex: August 09, 1965 (52 y.o. Male) Treating RN: Roger Shelter Primary Care Provider: Johny Drilling Other Clinician: Referring Provider: Gladstone Lighter Treating Provider/Extender: Melburn Hake,  Weeks in Treatment: 0 History of Present Illness HPI Description: 05/30/17 patient seen today on initial evaluation concerning cellulitis of the left lower extremity. He has a past medical history significant for diabetes mellitus type I, adjustment disorder with depressed mood, morbid obesity, paroxysmal atrial fibrillation, congestive heart failure, and more recently he was admitted from 05/18/17 through 05/20/17 where he was treated with IV vancomycin and Zosyn for the two days and then discharged with Keflex at home. He has been taking the Keflex up until this past Saturday, two days ago. He tells me however during the time that he was taking this that his erythema of the left lower extremity has just progressively gotten worse it definitely has not  showed any signs of improving. He doesn't feel like the Keflex was really beneficial for him. He has not seen his primary care provider currently he does see him this coming week on 06/02/17 and this is Dr. Kym Groom at [facility] primary care and [facility]. Patient's diabetes is uncontrolled he states that it can run anywhere from 400 to 550 as far as his blood sugars are concerned depending on if he is eating at home, eating out, or otherwise. He is also having trouble urinating he tells me today despite being on Lasix. He states he's just not going to the bathroom very much at all and can't seem to get rid of the fluid. Electronic Signature(s) Signed: 05/31/2017 8:40:52 AM By: Worthy Keeler PA-C Entered By: Worthy Keeler on 05/30/2017 17:39:21 [place]as[place], [name] ([id number]) -------------------------------------------------------------------------------- Physical Exam Details Patient Name: Russell [name] Date of Service: 05/30/2017 12:30 PM Medical Record Number: [id number] Patient Account Number: [id number] Date of Birth/Sex: 01-07-1966 (52 y.o. Male) Treating RN: Roger Shelter Primary Care Provider: Johny Drilling Other Clinician: Referring Provider: Gladstone Lighter Treating Provider/Extender: Melburn Hake,  Weeks in Treatment: 0 Constitutional respirations regular, non-labored and within target range for patient.Marland Kitchen temperature within target range for patient.. Obese and well-hydrated in no acute distress. Eyes conjunctiva clear no eyelid edema noted. pupils equal round and reactive to light and accommodation. Ears, Nose, Mouth, and Throat no gross abnormality of ear auricles or external auditory canals. normal hearing noted during conversation. mucus membranes moist. Respiratory normal breathing without difficulty. clear to auscultation bilaterally. Cardiovascular regular rate and rhythm with normal S1, S2. 1+ dorsalis pedis/posterior tibialis pulses of the left LE. 2+ pitting  edema of the left lower extremities. Gastrointestinal (GI) soft, non-tender, non-distended, +BS. no ventral hernia noted. Musculoskeletal unsteady while walking. Right LE prosthesis. Psychiatric this patient is able to make decisions and demonstrates good insight into disease process. Alert and Oriented x 3. pleasant and  cooperative. Notes On evaluation today patient had several rather superficial ulcers of the left lower extremity. The depth of the ulcerations did not concern me as much as the intense erythema noted of the lower extremity extending from the foot all the way up to just below his knee including the calf pretty much circumferentially. I explained to the patient that I feel like that he has a significant cellulitis at this point and I recommended that he go to the ER for IV antibiotic therapy. The patient however did not want to go to the ER and declined at this point. That will be detailed as far as the conversation in the plan. Electronic Signature(s) Signed: 05/31/2017 8:40:52 AM By: Worthy Keeler PA-C Entered By: Worthy Keeler on 05/30/2017 17:42:25 [place], [name] ([id number]) -------------------------------------------------------------------------------- Physician Orders Details Patient Name: Russell [name] Date of Service: 05/30/2017 12:30 PM Medical Record Number: [id number] Patient Account Number: [id number] Date of Birth/Sex: Sep 04, 1965 (52 y.o. Male) Treating RN: Roger Shelter Primary Care Provider: Johny Drilling Other Clinician: Referring Provider: Gladstone Lighter Treating Provider/Extender: Melburn Hake,  Weeks in Treatment: 0 Verbal / Phone Orders: No Diagnosis Coding ICD-10 Coding Code Description E10.621 Type 1 diabetes mellitus with foot ulcer L97.822 Non-pressure chronic ulcer of other part of left lower leg with fat layer exposed L97.522 Non-pressure chronic ulcer of other part of left foot with fat layer exposed L97.812 Non-pressure  chronic ulcer of other part of right lower leg with fat layer exposed F43.21 Adjustment disorder with depressed mood E66.01 Morbid (severe) obesity due to excess calories I48.0 Paroxysmal atrial fibrillation I50.9 Heart failure, unspecified Wound Cleansing Wound #1 Left,Medial Lower Leg o Clean wound with Normal Saline. Wound #2 Left,Dorsal Foot o Clean wound with Normal Saline. Wound #3 Left,Lateral Foot o Clean wound with Normal Saline. Wound #4 Right,Anterior Lower Leg o Clean wound with Normal Saline. Wound #5 Left,Posterior Lower Leg o Clean wound with Normal Saline. Anesthetic (add to Medication List) Wound #1 Left,Medial Lower Leg o Topical Lidocaine 4% cream applied to wound bed prior to debridement (In Clinic Only). Wound #2 Left,Dorsal Foot o Topical Lidocaine 4% cream applied to wound bed prior to debridement (In Clinic Only). Wound #3 Left,Lateral Foot o Topical Lidocaine 4% cream applied to wound bed prior to debridement (In Clinic Only). Wound #4 Right,Anterior Lower Leg o Topical Lidocaine 4% cream applied to wound bed prior to debridement (In Clinic Only). Wound #5 Left,Posterior Lower Leg o Topical Lidocaine 4% cream applied to wound bed prior to debridement (In Clinic Only). DOLLIE, MAYSE ([id number]) Primary Wound Dressing Wound #1 Left,Medial Lower Leg o Other: - silvercell Wound #2 Left,Dorsal Foot o Other: - silvercell Wound #3 Left,Lateral Foot o Other: - silvercell Wound #5 Left,Posterior Lower Leg o Other: - silvercell Secondary Dressing Wound #1 Left,Medial Lower Leg o ABD pad o Other - kerlix wrap Wound #2 Left,Dorsal Foot o ABD pad o Other - kerlix wrap Wound #3 Left,Lateral Foot o ABD pad o Other - kerlix wrap Wound #5 Left,Posterior Lower Leg o ABD pad o Other - kerlix wrap Dressing Change Frequency Wound #1 Left,Medial Lower Leg o Change dressing every other day. Wound #2 Left,Dorsal  Foot o Change dressing every other day. Wound #3 Left,Lateral Foot o Change dressing every other day. Wound #5 Left,Posterior Lower Leg o Change dressing every other day. Follow-up Appointments Wound #1 Left,Medial Lower Leg o Return Appointment in 1 week. Wound #2 Left,Dorsal Foot o Return Appointment in 1 week. Wound #3 Left,Lateral  Foot o Return Appointment in 1 week. Wound #5 Left,Posterior Lower Leg o Return Appointment in 1 week. KINGSTEN, ENFIELD ([id number]) Patient Medications Allergies: shellfish Notifications Medication Indication Start End doxycycline hyclate 05/30/2017 DOSE 1 - oral 100 mg capsule - 1 capsule oral taken 2 times a day for 14 days Electronic Signature(s) Signed: 05/30/2017 2:04:43 PM By: Worthy Keeler PA-C Entered By: Worthy Keeler on 05/30/2017 14:04:41 [place], [name] ([id number]) -------------------------------------------------------------------------------- Problem List Details Patient Name: Russell [name] Date of Service: 05/30/2017 12:30 PM Medical Record Number: [id number] Patient Account Number: [id number] Date of Birth/Sex: 06/02/1965 (52 y.o. Male) Treating RN: Roger Shelter Primary Care Provider: Johny Drilling Other Clinician: Referring Provider: Gladstone Lighter Treating Provider/Extender: Melburn Hake,  Weeks in Treatment: 0 Active Problems ICD-10 Impacting Encounter Code Description Active Date Wound Healing Diagnosis E10.621 Type 1 diabetes mellitus with foot ulcer 05/30/2017 Yes L03.116 Cellulitis of left lower limb 05/30/2017 Yes L97.822 Non-pressure chronic ulcer of other part of left lower leg with 05/30/2017 Yes fat layer exposed L97.522 Non-pressure chronic ulcer of other part of left foot with fat 05/30/2017 Yes layer exposed L97.812 Non-pressure chronic ulcer of other part of right lower leg with 05/30/2017 Yes fat layer exposed F43.21 Adjustment disorder with depressed mood 05/30/2017 Yes E66.01 Morbid  (severe) obesity due to excess calories 05/30/2017 Yes I48.0 Paroxysmal atrial fibrillation 05/30/2017 Yes I50.9 Heart failure, unspecified 05/30/2017 Yes Inactive Problems Resolved Problems KENDRIC, SINDELAR ([id number]) Electronic Signature(s) Signed: 05/31/2017 8:40:52 AM By: Worthy Keeler PA-C Entered By: Worthy Keeler on 05/30/2017 17:36:01 [place], [name] ([id number]) -------------------------------------------------------------------------------- Progress Note Details Patient Name: Russell [name] Date of Service: 05/30/2017 12:30 PM Medical Record Number: [id number] Patient Account Number: [id number] Date of Birth/Sex: [birth year] (52 y.o. Male) Treating RN: Roger Shelter Primary Care Provider: Johny Drilling Other Clinician: Referring Provider: Gladstone Lighter Treating Provider/Extender: Melburn Hake,  Weeks in Treatment: 0 Subjective Chief Complaint Information obtained from Patient Multiple LE Ulcers with associated cellulitis History of Present Illness (HPI) 05/30/17 patient seen today on initial evaluation concerning cellulitis of the left lower extremity. He has a past medical history significant for diabetes mellitus type I, adjustment disorder with depressed mood, morbid obesity, paroxysmal atrial fibrillation, congestive heart failure, and more recently he was admitted from 05/18/17 through 05/20/17 where he was treated with IV vancomycin and Zosyn for the two days and then discharged with Keflex at home. He has been taking the Keflex up until this past Saturday, two days ago. He tells me however during the time that he was taking this that his erythema of the left lower extremity has just progressively gotten worse it definitely has not showed any signs of improving. He doesn't feel like the Keflex was really beneficial for him. He has not seen his primary care provider currently he does see him this coming week on 06/02/17 and this is Dr. Kym Groom at [facility] primary care and  [facility]. Patient's diabetes is uncontrolled he states that it can run anywhere from 400 to 550 as far as his blood sugars are concerned depending on if he is eating at home, eating out, or otherwise. He is also having trouble urinating he tells me today despite being on Lasix. He states he's just not going to the bathroom very much at all and can't seem to get rid of the fluid. Wound History Patient presents with 4 open wounds that have been present for approximately 3 months. Patient has been treating wounds in the following manner: vaseline.  Laboratory tests have not been performed in the last month. Patient reportedly has tested positive for an antibiotic resistant organism. Patient reportedly has tested positive for osteomyelitis. Patient reportedly has had testing performed to evaluate circulation in the legs. Patient experiences the following problems associated with their wounds: swelling. Patient History Information obtained from Patient. Allergies shellfish Family History Cancer - Siblings, Diabetes - Father,Siblings,Mother, Heart Disease - Father, Hypertension - Father,Mother,Siblings, Kidney Disease - Father, Stroke - Father, No family history of Hereditary Spherocytosis, Lung Disease, Seizures, Thyroid Problems, Tuberculosis. Social History Never smoker, Marital Status - Married, Alcohol Use - Never, Drug Use - No History, Caffeine Use - Daily. Medical History Respiratory Patient has history of Sleep Apnea Cardiovascular Patient has history of Arrhythmia - a-fib, Congestive Heart Failure, Hypertension Endocrine ALANMICHAEL, BARMORE ([id number]) Patient has history of Type I Diabetes - since 2000 Neurologic Patient has history of Dementia Patient is treated with Insulin. Blood sugar is tested. Review of Systems (ROS) Constitutional Symptoms (General Health) The patient has no complaints or symptoms. Eyes The patient has no complaints or  symptoms. Ear/Nose/Mouth/Throat The patient has no complaints or symptoms. Hematologic/Lymphatic The patient has no complaints or symptoms. Cardiovascular hyperlipidemia Gastrointestinal The patient has no complaints or symptoms. Genitourinary The patient has no complaints or symptoms. Immunological The patient has no complaints or symptoms. Integumentary (Skin) Complains or has symptoms of Wounds. Musculoskeletal The patient has no complaints or symptoms. Oncologic The patient has no complaints or symptoms. Psychiatric depression Objective Constitutional respirations regular, non-labored and within target range for patient.Marland Kitchen temperature within target range for patient.. Obese and well-hydrated in no acute distress. Vitals Time Taken: 12:38 PM, Height: 71 in, Source: Stated, Weight: 389.6 lbs, Source: Measured, BMI: 54.3, Temperature: 98.0 F, Respiratory Rate: 20 breaths/min. Eyes conjunctiva clear no eyelid edema noted. pupils equal round and reactive to light and accommodation. Ears, Nose, Mouth, and Throat no gross abnormality of ear auricles or external auditory canals. normal hearing noted during conversation. mucus membranes moist. Respiratory normal breathing without difficulty. clear to auscultation bilaterally. Kohlman, JEVIN CAMINO ([id number]) Cardiovascular regular rate and rhythm with normal S1, S2. 1+ dorsalis pedis/posterior tibialis pulses of the left LE. 2+ pitting edema of the left lower extremities. Gastrointestinal (GI) soft, non-tender, non-distended, +BS. no ventral hernia noted. Musculoskeletal unsteady while walking. Right LE prosthesis. Psychiatric this patient is able to make decisions and demonstrates good insight into disease process. Alert and Oriented x 3. pleasant and cooperative. General Notes: On evaluation today patient had several rather superficial ulcers of the left lower extremity. The depth of the ulcerations did not concern me as much  as the intense erythema noted of the lower extremity extending from the foot all the way up to just below his knee including the calf pretty much circumferentially. I explained to the patient that I feel like that he has a significant cellulitis at this point and I recommended that he go to the ER for IV antibiotic therapy. The patient however did not want to go to the ER and declined at this point. That will be detailed as far as the conversation in the plan. Integumentary (Hair, Skin) Wound #1 status is Open. Original cause of wound was Trauma. The wound is located on the Left,Medial Lower Leg. The wound measures 2.5cm length x 4.1cm width x 0.1cm depth; 8.05cm^2 area and 0.805cm^3 volume. There is no tunneling or undermining noted. There is a large amount of serosanguineous drainage noted. The wound margin is distinct with the outline attached  to the wound base. There is small (1-33%) red granulation within the wound bed. There is a large (67-100%) amount of necrotic tissue within the wound bed including Adherent Slough. The periwound skin appearance exhibited: Rubor, Erythema. The surrounding wound skin color is noted with erythema which is circumferential. Periwound temperature was noted as Hot. The periwound has tenderness on palpation. Wound #2 status is Open. Original cause of wound was Gradually Appeared. The wound is located on the Left,Dorsal Foot. The wound measures 1.8cm length x 1.5cm width x 0.1cm depth; 2.121cm^2 area and 0.212cm^3 volume. There is no tunneling or undermining noted. There is a large amount of serous drainage noted. The wound margin is distinct with the outline attached to the wound base. There is small (1-33%) red granulation within the wound bed. There is a large (67-100%) amount of necrotic tissue within the wound bed including Eschar and Adherent Slough. The periwound skin appearance exhibited: Rubor, Erythema. The surrounding wound skin color is noted with  erythema which is circumferential. Periwound temperature was noted as Hot. The periwound has tenderness on palpation. Wound #3 status is Open. Original cause of wound was Gradually Appeared. The wound is located on the Left,Lateral Foot. The wound measures 1.5cm length x 1.6cm width x 0.1cm depth; 1.885cm^2 area and 0.188cm^3 volume. There is no tunneling or undermining noted. There is a large amount of serosanguineous drainage noted. The wound margin is distinct with the outline attached to the wound base. There is large (67-100%) red granulation within the wound bed. There is a small (1-33%) amount of necrotic tissue within the wound bed including Adherent Slough. The periwound skin appearance exhibited: Maceration, Erythema. The surrounding wound skin color is noted with erythema which is circumferential. Periwound temperature was noted as Hot. The periwound has tenderness on palpation. Wound #4 status is Open. Original cause of wound was Gradually Appeared. The wound is located on the Right,Anterior Lower Leg. The wound measures 0.5cm length x 1.4cm width x 0.1cm depth; 0.55cm^2 area and 0.055cm^3 volume. There is no tunneling or undermining noted. There is a large amount of serosanguineous drainage noted. The wound margin is distinct with the outline attached to the wound base. There is no granulation within the wound bed. There is a large (67-100%) amount of necrotic tissue within the wound bed including Eschar. The periwound skin appearance exhibited: Rubor, Erythema. The surrounding wound skin color is noted with erythema which is circumferential. Periwound temperature was noted as Hot. The periwound has tenderness on palpation. Wound #5 status is Open. Original cause of wound was Gradually Appeared. The wound is located on the Left,Posterior Lower Leg. The wound measures 1.5cm length x 2.1cm width x 0.1cm depth; 2.474cm^2 area and 0.247cm^3 volume. There is no tunneling or undermining  noted. There is a large amount of serous drainage noted. The wound margin is distinct with the outline attached to the wound base. There is large (67-100%) red granulation within the wound bed. There is a small (1-33%) amount of necrotic tissue within the wound bed including Adherent Slough. The periwound skin appearance exhibited: TREVELL, PARISEAU. ([id number]) Excoriation, Maceration, Rubor, Erythema. The surrounding wound skin color is noted with erythema which is circumferential. Periwound temperature was noted as Hot. The periwound has tenderness on palpation. Assessment Active Problems ICD-10 E10.621 - Type 1 diabetes mellitus with foot ulcer L03.116 - Cellulitis of left lower limb L97.822 - Non-pressure chronic ulcer of other part of left lower leg with fat layer exposed L97.522 - Non-pressure chronic ulcer of  other part of left foot with fat layer exposed L97.812 - Non-pressure chronic ulcer of other part of right lower leg with fat layer exposed F43.21 - Adjustment disorder with depressed mood E66.01 - Morbid (severe) obesity due to excess calories I48.0 - Paroxysmal atrial fibrillation I50.9 - Heart failure, unspecified Plan Wound Cleansing: Wound #1 Left,Medial Lower Leg: Clean wound with Normal Saline. Wound #2 Left,Dorsal Foot: Clean wound with Normal Saline. Wound #3 Left,Lateral Foot: Clean wound with Normal Saline. Wound #4 Right,Anterior Lower Leg: Clean wound with Normal Saline. Wound #5 Left,Posterior Lower Leg: Clean wound with Normal Saline. Anesthetic (add to Medication List): Wound #1 Left,Medial Lower Leg: Topical Lidocaine 4% cream applied to wound bed prior to debridement (In Clinic Only). Wound #2 Left,Dorsal Foot: Topical Lidocaine 4% cream applied to wound bed prior to debridement (In Clinic Only). Wound #3 Left,Lateral Foot: Topical Lidocaine 4% cream applied to wound bed prior to debridement (In Clinic Only). Wound #4 Right,Anterior Lower  Leg: Topical Lidocaine 4% cream applied to wound bed prior to debridement (In Clinic Only). Wound #5 Left,Posterior Lower Leg: Topical Lidocaine 4% cream applied to wound bed prior to debridement (In Clinic Only). Primary Wound Dressing: Wound #1 Left,Medial Lower Leg: Other: - silvercell Wound #2 Left,Dorsal Foot: Other: - silvercell Wound #3 Left,Lateral Foot: Other: - silvercell Wound #5 Left,Posterior Lower Leg: JAMESMICHAEL, SHADD. ([id number]) Other: - silvercell Secondary Dressing: Wound #1 Left,Medial Lower Leg: ABD pad Other - kerlix wrap Wound #2 Left,Dorsal Foot: ABD pad Other - kerlix wrap Wound #3 Left,Lateral Foot: ABD pad Other - kerlix wrap Wound #5 Left,Posterior Lower Leg: ABD pad Other - kerlix wrap Dressing Change Frequency: Wound #1 Left,Medial Lower Leg: Change dressing every other day. Wound #2 Left,Dorsal Foot: Change dressing every other day. Wound #3 Left,Lateral Foot: Change dressing every other day. Wound #5 Left,Posterior Lower Leg: Change dressing every other day. Follow-up Appointments: Wound #1 Left,Medial Lower Leg: Return Appointment in 1 week. Wound #2 Left,Dorsal Foot: Return Appointment in 1 week. Wound #3 Left,Lateral Foot: Return Appointment in 1 week. Wound #5 Left,Posterior Lower Leg: Return Appointment in 1 week. The following medication(s) was prescribed: doxycycline hyclate oral 100 mg capsule 1 1 capsule oral taken 2 times a day for 14 days starting 05/30/2017 I had a very extensive conversation with patient concerning the fact that I wanted him to go to the ER for IV antibiotic therapy today and he declined. I further explained that declining and not going to put him at risk for limb loss, sepsis, and possibly death. This was very bluntly put forward to him in the presence of his wife Tavio Biegel and also with my case managing nurse Malachy Mood as well. I was very upfront and honest with patient due to the fact that I am very  concerned about how things are gonna progress if he does not go for IV antibiotic therapy. Despite this very blunt conversation he still want to be just to prescribe him an oral antibiotic as he did not want to go back to [facility] as far as the ER is concerned and states that he does not have anyone that can take him to [facility]. His exact words were "give me an antibiotic and then I will follow up with my primary on April 4 and see what they say." I further explained I did not want him to wait to April 4. Nonetheless he still declined to go to the ER. I therefore did grudgingly consent to sitting in a  prescription for doxycycline since the Keflex was not effective for him over the past week and a half that he has been taking it. However I did contact his primary care provider's office and spoke with Estill Bamberg who is one of the nurses that works with Dr. Kym Groom. I explained the situation and the fact that the patient would be likely coming to them on Thursday and what they would be dealing with when he arrived. After hearing the story of what was going on she stated that she was sure that Dr. Kym Groom would want him to go to the ER for the therapy as recommended. Nonetheless I explained further that he was declining my recommendation in this regard. She is therefore going to explain the situation to Dr. Kym Groom and see if he wanted to contact the patient to discuss this further and urge a visit at the ER whether here or at [facility]. I was appreciative of her help in this regard as I still feel like this would be the best thing for the patient. Please see above for specific wound care orders. We will see patient for re-evaluation in 1 week(s) here in the clinic. If anything worsens or changes patient will contact our office for additional recommendations. NEYTHAN, KOZLOV ([id number]) Electronic Signature(s) Signed: 05/31/2017 8:40:52 AM By: Worthy Keeler PA-C Entered By: Worthy Keeler on 05/30/2017  17:46:19 Westermeyer, [name] ([id number]) -------------------------------------------------------------------------------- ROS/PFSH Details Patient Name: Russell [name] Date of Service: 05/30/2017 12:30 PM Medical Record Number: [id number] Patient Account Number: [id number] Date of Birth/Sex: 26-Oct-1965 (52 y.o. 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, VRE)o Yes Have you tested positive for osteomyelitis (bone infection)o Yes Have you had any tests for circulation on your legso Yes [facility] Have you had other problems associated with your woundso Swelling Integumentary (Skin) Complaints and Symptoms: Positive for: Wounds Constitutional Symptoms (General Health) Complaints and Symptoms: No Complaints or Symptoms Eyes Complaints and Symptoms: No Complaints or Symptoms Ear/Nose/Mouth/Throat Complaints and Symptoms: No Complaints or Symptoms Hematologic/Lymphatic Complaints and Symptoms: No Complaints or Symptoms Respiratory Medical History: Positive for: Sleep Apnea Cardiovascular Complaints and Symptoms: Review of System Notes: HORRIS, SPEROS ([id number]) hyperlipidemia Medical History: Positive for: Arrhythmia - a-fib; Congestive Heart Failure; Hypertension Gastrointestinal Complaints and Symptoms: No Complaints or Symptoms Endocrine Medical History: Positive  for: Type I Diabetes - since 2000 Treated with: Insulin Blood sugar tested every day: Yes Tested : 3-4 times a day Genitourinary Complaints and Symptoms: No Complaints or Symptoms Immunological Complaints and Symptoms: No Complaints or Symptoms Musculoskeletal Complaints and Symptoms: No Complaints or Symptoms Neurologic Medical History: Positive for: Dementia Oncologic Complaints and Symptoms: No Complaints or Symptoms Psychiatric Complaints and Symptoms: Review of System Notes: depression Immunizations Pneumococcal Vaccine: Received Pneumococcal Vaccination: Yes Implantable Devices Family and Social History Cancer: Yes - Siblings; Diabetes: Yes - Father,Siblings,Mother; Heart Disease: Yes - Father; Hereditary Spherocytosis: No; Hypertension: Yes - Father,Mother,Siblings; Kidney Disease: Yes -  Father; Lung Disease: No; Seizures: No; Stroke: Yes - Father; Thyroid Problems: No; Tuberculosis: No; Never smoker; Marital Status - Married; Alcohol Use: Never; Drug Use: No Goudeau, DAIWIK BUFFALO. ([id number]) History; Caffeine Use: Daily; Financial Concerns: No; Food, Clothing or Shelter Needs: No; Support System Lacking: No; Transportation Concerns: No; Advanced Directives: No; Patient does not want information on Advanced Directives; Do not resuscitate: No; Living Will: No; Medical Power of Attorney: No Electronic Signature(s) Signed: 05/31/2017 8:40:52 AM By: Worthy Keeler PA-C Signed: 06/02/2017 4:32:30 PM By: Alric Quan Entered By: Alric Quan on 05/30/2017 12:47:42 [place], [name] ([id number]) -------------------------------------------------------------------------------- [facility] Details Patient Name: Russell [name] Date of Service: 05/30/2017 Medical Record Number: [id number] Patient Account Number: [id number] Date of Birth/Sex: 07-19-1965 (51 y.o. Male) Treating RN: Roger Shelter Primary Care Provider: Johny Drilling Other Clinician: Referring Provider:  Gladstone Lighter Treating Provider/Extender: Melburn Hake,  Weeks in Treatment: 0 Diagnosis Coding ICD-10 Codes Code Description E10.621 Type 1 diabetes mellitus with foot ulcer L03.116 Cellulitis of left lower limb L97.822 Non-pressure chronic ulcer of other part of left lower leg with fat layer exposed L97.522 Non-pressure chronic ulcer of other part of left foot with fat layer exposed L97.812 Non-pressure chronic ulcer of other part of right lower leg with fat layer exposed F43.21 Adjustment disorder with depressed mood E66.01 Morbid (severe) obesity due to excess calories I48.0 Paroxysmal atrial fibrillation I50.9 Heart failure, unspecified Facility Procedures CPT4 Code: 62831517 Description: 61607 - WOUND CARE VISIT-LEV 5 EST PT Modifier: Quantity: 1 Physician Procedures CPT4 Code Description: [id number] - WC PHYS LEVEL 4 - NEW PT ICD-10 Diagnosis Description E10.621 Type 1 diabetes mellitus with foot ulcer L03.116 Cellulitis of left lower limb L97.822 Non-pressure chronic ulcer of other part of left lower leg wit  L97.522 Non-pressure chronic ulcer of other part of left foot with fat Modifier: h fat layer expos layer exposed Quantity: 1 ed Electronic Signature(s) Signed: 05/31/2017 8:40:52 AM By: Worthy Keeler PA-C Entered By: Worthy Keeler on 05/30/2017 17:46:56

## 2017-06-03 NOTE — Progress Notes (Signed)
Russell [name], Russell [name] ([id number]) Visit Report for 05/30/2017 Allergy List Details Patient Name: Russell [name], Russell [name] Date of Service: 05/30/2017 12:30 PM Medical Record Number: [id number] Patient Account Number: [id number] Date of Birth/Sex: 1965-06-17 (51 y.o. Male) Treating RN: Ahmed Prima Primary Care Tavita Eastham: Johny Drilling Other Clinician: Referring Justyn Boyson: Gladstone Lighter Treating Magdeline Prange/Extender: Melburn Hake, HOYT Weeks in Treatment: 0 Allergies Active Allergies shellfish Allergy Notes Electronic Signature(s) Signed: 06/02/2017 4:32:30 PM By: Alric Quan Entered By: Alric Quan on 05/30/2017 12:39:42 Mila Doce, Russell [name] ([id number]) -------------------------------------------------------------------------------- [place] Information Details Patient Name: Russell [name] Date of Service: 05/30/2017 12:30 PM Medical Record Number: [id number] Patient Account Number: [id number] Date of Birth/Sex: 04-12-1965 (53 y.o. Male) Treating RN: Ahmed Prima Primary Care Orlene Salmons: Johny Drilling Other Clinician: Referring Jaelynn Currier: Gladstone Lighter Treating Aleatha Taite/Extender: Melburn Hake, HOYT Weeks in Treatment: 0 Visit Information Patient Arrived: Ambulatory Arrival Time: 12:37 Accompanied By: wife Transfer Assistance: None Patient Identification Verified: Yes Secondary Verification Process Completed: Yes Patient Requires Transmission-Based No Precautions: Patient Has Alerts: Yes Patient Alerts: DM I Electronic Signature(s) Signed: 06/02/2017 4:32:30 PM By: Alric Quan Entered By: Alric Quan on 05/30/2017 12:38:25 Russell [name], Russell [name] ([id number]) -------------------------------------------------------------------------------- Clinic Level of Care Assessment Details Patient Name: Russell [name] Date of Service: 05/30/2017 12:30 PM Medical Record Number: [id number] Patient Account Number: [id number] Date of Birth/Sex: 1965-06-18 (52 y.o. Male) Treating RN: Roger Shelter Primary Care Iyanni Hepp: Johny Drilling Other Clinician: Referring Dakayla Disanti: Gladstone Lighter Treating Taryn Nave/Extender: Melburn Hake, HOYT Weeks in Treatment: 0 Clinic Level of Care Assessment Items TOOL 2 Quantity Score X - Use when only an EandM is performed on the INITIAL visit 1 0 ASSESSMENTS - Nursing Assessment / Reassessment X - General Physical Exam (combine w/ comprehensive assessment (listed just below) when 1 20 performed on new pt. evals) X- 1 25 Comprehensive Assessment (HX, ROS, Risk Assessments, Wounds Hx, etc.) ASSESSMENTS - Wound and Skin Assessment / Reassessment []  - Simple Wound Assessment / Reassessment - one wound 0 X- 4 5 Complex Wound Assessment / Reassessment - multiple wounds []  - 0 Dermatologic / Skin Assessment (not related to wound area) ASSESSMENTS - Ostomy and/or Continence Assessment and Care []  - Incontinence Assessment and Management 0 []  - 0 Ostomy Care Assessment and Management (repouching, etc.) PROCESS - Coordination of Care []  - Simple Patient / Family Education for ongoing care 0 X- 1 20 Complex (extensive) Patient / Family Education for ongoing care []  - 0 Staff obtains Programmer, systems, Records, Test Results / Process Orders []  - 0 Staff telephones HHA, Nursing Homes / Clarify orders / etc []  - 0 Routine Transfer to another Facility (non-emergent condition) []  - 0 Routine Hospital Admission (non-emergent condition) []  - 0 New Admissions / Biomedical engineer / Ordering NPWT, Apligraf, etc. []  - 0 Emergency Hospital Admission (emergent condition) []  - 0 Simple Discharge Coordination X- 1 15 Complex (extensive) Discharge Coordination PROCESS - Special Needs []  - Pediatric / Minor Patient Management 0 []  - 0 Isolation Patient Management Russell [name], Russell [name] ([id number]) []  - 0 Hearing / Language / Visual special needs []  - 0 Assessment of Community assistance (transportation, D/C planning, etc.) []  - 0 Additional assistance /  Altered mentation []  - 0 Support Surface(s) Assessment (bed, cushion, seat, etc.) INTERVENTIONS - Wound Cleansing / Measurement X - Wound Imaging (photographs - any number of wounds) 1 5 []  - 0 Wound Tracing (instead of photographs) []  - 0 Simple Wound Measurement - one wound X- 4 5 Complex Wound Measurement - multiple wounds []  - 0 Simple  Wound Cleansing - one wound X- 4 5 Complex Wound Cleansing - multiple wounds INTERVENTIONS - Wound Dressings X - Small Wound Dressing one or multiple wounds 4 10 []  - 0 Medium Wound Dressing one or multiple wounds []  - 0 Large Wound Dressing one or multiple wounds []  - 0 Application of Medications - injection INTERVENTIONS - Miscellaneous []  - External ear exam 0 []  - 0 Specimen Collection (cultures, biopsies, blood, body fluids, etc.) []  - 0 Specimen(s) / Culture(s) sent or taken to Lab for analysis []  - 0 Patient Transfer (multiple staff / Harrel Lemon Lift / Similar devices) []  - 0 Simple Staple / Suture removal (25 or less) []  - 0 Complex Staple / Suture removal (26 or more) []  - 0 Hypo / Hyperglycemic Management (close monitor of Blood Glucose) X- 1 15 Ankle / Brachial Index (ABI) - do not check if billed separately Has the patient been seen at the hospital within the last three years: Yes Total Score: 200 Level Of Care: New/Established - Level 5 Electronic Signature(s) Signed: 05/30/2017 5:00:26 PM By: Roger Shelter Entered By: Roger Shelter on 05/30/2017 13:58:02 Russell [name], Russell [name] ([id number]) -------------------------------------------------------------------------------- Encounter Discharge Information Details Patient Name: Russell [name] Date of Service: 05/30/2017 12:30 PM Medical Record Number: [id number] Patient Account Number: [id number] Date of Birth/Sex: March 24, 1965 (52 y.o. Male) Treating RN: Roger Shelter Primary Care Coy Vandoren: Johny Drilling Other Clinician: Referring Fabyan Loughmiller: Gladstone Lighter Treating  Randel Hargens/Extender: Melburn Hake, HOYT Weeks in Treatment: 0 Encounter Discharge Information Items Discharge Pain Level: 0 Discharge Condition: Stable Ambulatory Status: Ambulatory Discharge Destination: Home Transportation: Private Auto Accompanied By: sister Schedule Follow-up Appointment: Yes Medication Reconciliation completed and No provided to Patient/Care Shayden Bobier: Provided on Clinical Summary of Care: 05/30/2017 Form Type Recipient Paper Patient JP Electronic Signature(s) Signed: 05/30/2017 5:08:18 PM By: Montey Hora Entered By: Montey Hora on 05/30/2017 15:01:56 Hawley, Russell [name] ([id number]) -------------------------------------------------------------------------------- Lower Extremity Assessment Details Patient Name: Russell [name] Date of Service: 05/30/2017 12:30 PM Medical Record Number: [id number] Patient Account Number: [id number] Date of Birth/Sex: 12-Jul-1965 (52 y.o. Male) Treating RN: Ahmed Prima Primary Care Shahed Yeoman: Johny Drilling Other Clinician: Referring Milarose Savich: Gladstone Lighter Treating Wanona Stare/Extender: Melburn Hake, HOYT Weeks in Treatment: 0 Edema Assessment Assessed: [Left: No] [Right: No] Edema: [Left: Ye] [Right: s] Calf Left: Right: Point of Measurement: 36 cm From Medial Instep 55.6 cm cm Ankle Left: Right: Point of Measurement: 11 cm From Medial Instep 30.7 cm cm Vascular Assessment Pulses: Dorsalis Pedis Palpable: [Left:No] Doppler Audible: [Left:Yes] Posterior Tibial Palpable: [Left:No] Doppler Audible: [Left:Yes] Extremity colors, hair growth, and conditions: Extremity Color: [Left:Red] Temperature of Extremity: [Left:Hot] Capillary Refill: [Left:< 3 seconds] Blood Pressure: Brachial: [Left:140] Dorsalis Pedis: 100 [Left:Dorsalis Pedis:] Ankle: Posterior Tibial: 200 [Left:Posterior Tibial: 1.43] Toe Nail Assessment Left: Right: Thick: Yes Discolored: Yes Deformed: Yes Improper Length and Hygiene: No Electronic  Signature(s) Signed: 06/02/2017 4:32:30 PM By: Alric Quan Entered By: Alric Quan on 05/30/2017 13:06:52 Russell [name], Russell [name] ([id number]) -------------------------------------------------------------------------------- Multi Wound Chart Details Patient Name: Russell [name] Date of Service: 05/30/2017 12:30 PM Medical Record Number: [id number] Patient Account Number: [id number] Date of Birth/Sex: 03/23/1965 (52 y.o. 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] [2:Lower Extremity] [3:Lower Extremity] Secondary Etiology: [1:Trauma, Other] [2:N/A] [3:N/A] Comorbid History: [1:Sleep Apnea, Arrhythmia, Congestive Heart Failure, Hypertension, Type I Diabetes, Hypertension, Type I Diabetes, Hypertension, Type I Diabetes, Dementia] [2:Sleep Apnea, Arrhythmia, Congestive Heart Failure, Dementia] [3:Sleep Apnea,  Arrhythmia, Congestive Heart Failure, Dementia] Date Acquired: [1:05/30/2016] [2:11/29/2016] [3:11/29/2016] Weeks of Treatment: [1:0] [2:0] [3:0] Wound Status: [1:Open] [2:Open] [3:Open] Clustered Wound: [1:Yes] [2:No] [3:No] Clustered Quantity: [1:N/A] [2:N/A] [3:N/A] Measurements L x W x D [1:2.5x4.1x0.1] [2:1.8x1.5x0.1] [3:1.5x1.6x0.1] (cm) Area (cm) : [1:8.05] [2:2.121] [3:1.885] Volume (cm) : [1:0.805] [2:0.212] [3:0.188] % Reduction in  Area: [1:0.00%] [2:0.00%] [3:0.00%] % Reduction in Volume: [1:0.00%] [2:0.00%] [3:0.00%] Classification: [1:Grade 1] [2:Grade 1] [3:Grade 1] Exudate Amount: [1:Large] [2:Large] [3:Large] Exudate Type: [1:Serosanguineous] [2:Serous] [3:Serosanguineous] Exudate Color: [1:red, brown] [2:amber] [3:red, brown] Wound Margin: [1:Distinct, outline attached] [2:Distinct, outline attached] [3:Distinct, outline attached] Granulation Amount: [1:Small (1-33%)] [2:Small (1-33%)] [3:Large (67-100%)] Granulation Quality: [1:Red] [2:Red] [3:Red] Necrotic Amount: [1:Large (67-100%)] [2:Large (67-100%)] [3:Small (1-33%)] Necrotic Tissue: [1:Adherent Slough] [2:Eschar, Adherent Slough] [3:Adherent Slough] Exposed Structures: [1:Fascia: No Fat Layer (Subcutaneous Tissue) Exposed: No Tendon: No Muscle: No Joint: No Bone: No] [2:Fascia: No Fat Layer (Subcutaneous Tissue) Exposed: No Tendon: No Muscle: No Joint: No Bone: No] [3:Fascia: No Fat Layer (Subcutaneous Tissue) Exposed:  No Tendon: No Muscle: No Joint: No Bone: No] Epithelialization: None None None Periwound Skin Texture: No Abnormalities Noted No Abnormalities Noted No Abnormalities Noted Periwound Skin Moisture: No Abnormalities Noted No Abnormalities Noted Maceration: Yes Periwound Skin Color: Erythema: Yes Erythema: Yes Erythema: Yes Rubor: Yes Rubor: Yes Erythema Location: Circumferential Circumferential Circumferential Temperature: Hot Hot Hot Tenderness on Palpation: Yes Yes Yes Wound Preparation: Ulcer Cleansing: Ulcer Cleansing: Ulcer Cleansing: Rinsed/Irrigated with Saline Rinsed/Irrigated with Saline Rinsed/Irrigated with Saline Topical Anesthetic Applied: Topical Anesthetic Applied: Topical Anesthetic Applied: Other: lidocaine 4% Other: lidocaine 4% Other: lidocaine 4% Wound Number: 4 5 N/A Photos: No Photos No Photos N/A Wound Location: Right Lower Leg - Anterior Left Lower Leg - Posterior N/A Wounding Event: Gradually Appeared  Gradually Appeared N/A Primary Etiology: Diabetic Wound/Ulcer of the Diabetic Wound/Ulcer of the N/A Lower Extremity Lower Extremity Secondary Etiology: N/A N/A N/A Comorbid History: Sleep Apnea, Arrhythmia, Sleep Apnea, Arrhythmia, N/A Congestive Heart Failure, Congestive Heart Failure, Hypertension, Type I Diabetes, Hypertension, Type I Diabetes, Dementia Dementia Date Acquired: 05/30/2017 05/30/2017 N/A Weeks of Treatment: 0 0 N/A Wound Status: Open Open N/A Clustered Wound: Yes Yes N/A Clustered Quantity: 2 4 N/A Measurements L x W x D 0.5x1.4x0.1 1.5x2.1x0.1 N/A (cm) Area (cm) : 0.55 2.474 N/A Volume (cm) : 0.055 0.247 N/A % Reduction in Area: N/A N/A N/A % Reduction in Volume: N/A N/A N/A Classification: Grade 1 Grade 1 N/A Exudate Amount: Large Large N/A Exudate Type: Serosanguineous Serous N/A Exudate Color: red, brown amber N/A Wound Margin: Distinct, outline attached Distinct, outline attached N/A Granulation Amount: None Present (0%) Large (67-100%) N/A Granulation Quality: N/A Red N/A Necrotic Amount: Large (67-100%) Small (1-33%) N/A Necrotic Tissue: Eschar Adherent Slough N/A Exposed Structures: Fascia: No Fascia: No N/A Fat Layer (Subcutaneous Fat Layer (Subcutaneous Tissue) Exposed: No Tissue) Exposed: No Tendon: No Tendon: No Muscle: No Muscle: No Joint: No Joint: No Bone: No Bone: No Epithelialization: None None N/A Periwound Skin Texture: No Abnormalities Noted Excoriation: Yes N/A Periwound Skin Moisture: No Abnormalities Noted Maceration: Yes N/A Periwound Skin Color: Erythema: Yes Erythema: Yes N/A Rubor: Yes  Rubor: Yes Russell [name], Russell [name] ([id number]) Erythema Location: Circumferential Circumferential N/A Temperature: Hot Hot N/A Tenderness on Palpation: Yes Yes N/A Wound Preparation: Ulcer Cleansing: Ulcer Cleansing: N/A Rinsed/Irrigated with Saline Rinsed/Irrigated with Saline Topical Anesthetic Applied: Topical Anesthetic  Applied: Other: lidocaine 4% Other: lidocaine 4% Treatment Notes Electronic Signature(s) Signed: 05/30/2017 5:00:26 PM By: Roger Shelter Entered By: Roger Shelter on 05/30/2017 13:34:00 Comella, Russell [name] ([id number]) -------------------------------------------------------------------------------- [place] Details Patient Name: Russell [name] Date of Service: 05/30/2017 12:30 PM Medical Record Number: [id number] Patient Account Number: [id number] Date of Birth/Sex: 08/01/1965 (52 y.o. Male) Treating RN: Roger Shelter Primary Care Jersey Espinoza: Johny Drilling Other Clinician: Referring Madgie Dhaliwal: Gladstone Lighter Treating Shantika Bermea/Extender: Melburn Hake, HOYT Weeks in Treatment: 0 Active Inactive ` Orientation to the Wound Care Program Nursing Diagnoses: Knowledge deficit related to the wound healing center program Goals: Patient/caregiver will verbalize understanding of the [place] Program Date Initiated: 05/30/2017 Target Resolution Date: 06/20/2017 Goal Status: Active Interventions: Provide education on orientation to the wound center Notes: ` Wound/Skin Impairment Nursing Diagnoses: Impaired tissue integrity Goals: Patient/caregiver will verbalize understanding of skin care regimen Date Initiated: 05/30/2017 Target Resolution Date: 06/20/2017 Goal Status: Active Ulcer/skin breakdown will have a volume reduction of 30% by week 4 Date Initiated: 05/30/2017 Target Resolution Date: 06/20/2017 Goal Status: Active Interventions: Assess patient/caregiver ability to obtain necessary supplies Assess patient/caregiver ability to perform ulcer/skin care regimen upon admission and as needed Assess ulceration(s) every visit Treatment Activities: Skin care regimen initiated : 05/30/2017 Notes: Electronic Signature(s) Signed: 05/30/2017 5:00:26 PM By: Neila Gear ([id number]) Entered By: Roger Shelter on 05/30/2017 13:33:39 Russell [name],  Russell [name] ([id number]) -------------------------------------------------------------------------------- Pain Assessment Details Patient Name: Russell [name] Date of Service: 05/30/2017 12:30 PM Medical Record Number: [id number] Patient Account Number: [id number] Date of Birth/Sex: 1965/05/10 (52 y.o. Male) Treating RN: Ahmed Prima Primary Care Nekhi Liwanag: Johny Drilling Other Clinician: Referring Keanthony Poole: Gladstone Lighter Treating Kendre Sires/Extender: Melburn Hake, HOYT Weeks in Treatment: 0 Active Problems Location of Pain Severity and Description of Pain Patient Has Paino No Site Locations Pain Management and Medication Current Pain Management: Electronic Signature(s) Signed: 06/02/2017 4:32:30 PM By: Alric Quan Entered By: Alric Quan on 05/30/2017 12:38:43 Russell [name], Russell [name] ([id number]) -------------------------------------------------------------------------------- Patient/Caregiver Education Details Patient Name: Russell [name] Date of Service: 05/30/2017 12:30 PM Medical Record Number: [id number] Patient Account Number: [id number] Date of Birth/Gender: [birth year] (52 y.o. Male) Treating RN: Montey Hora Primary Care Physician: Johny Drilling Other Clinician: Referring Physician: Gladstone Lighter Treating Physician/Extender: Worthy Keeler Weeks in Treatment: 0 Education Assessment Education Provided To: Patient Education Topics Provided Wound/Skin Impairment: Handouts: Other: wound care as ordered Methods: Demonstration, Explain/Verbal Responses: State content correctly Electronic Signature(s) Signed: 05/30/2017 5:08:18 PM By: Montey Hora Entered By: Montey Hora on 05/30/2017 15:03:02 Russell [name], Russell [name] ([id number]) -------------------------------------------------------------------------------- Wound Assessment Details Patient Name: Russell [name] Date of Service: 05/30/2017 12:30 PM Medical Record Number: [id number] Patient Account Number:  [id number] Date of Birth/Sex: Oct 24, 1965 (52 y.o. Male) Treating RN: Ahmed Prima Primary Care Eldon Zietlow: Johny Drilling Other Clinician: Referring Inella Kuwahara: Gladstone Lighter Treating Sayaka Hoeppner/Extender: STONE III, HOYT Weeks in Treatment: 0 Wound Status Wound Number: 1 Primary Diabetic Wound/Ulcer of the Lower Extremity Etiology: Wound Location: Left Lower Leg - Medial Secondary Trauma, Other Wounding Event: Trauma Etiology: Date Acquired: 05/30/2016 Wound Open Weeks Of Treatment: 0 Status: Clustered Wound: Yes Comorbid Sleep Apnea, Arrhythmia, Congestive Heart History: Failure, Hypertension, Type I Diabetes, Dementia Photos Photo Uploaded By: Roger Shelter on 05/30/2017 16:48:14 Wound Measurements Length: (cm) 2.5  Width: (cm) 4.1 Depth: (cm) 0.1 Area: (cm) 8.05 Volume: (cm) 0.805 % Reduction in Area: 0% % Reduction in Volume: 0% Epithelialization: None Tunneling: No Undermining: No Wound Description Classification: Grade 1 Wound Margin: Distinct, outline attached Exudate Amount: Large Exudate Type: Serosanguineous Exudate Color: red, brown Foul Odor After Cleansing: No Slough/Fibrino Yes Wound Bed Granulation Amount: Small (1-33%) Exposed Structure Granulation Quality: Red Fascia Exposed: No Necrotic Amount: Large (67-100%) Fat Layer (Subcutaneous Tissue) Exposed: No Necrotic Quality: Adherent Slough Tendon Exposed: No Muscle Exposed: No Joint Exposed: No Bone Exposed: No Carriveau, Georges E. ([id number]) Periwound Skin Texture Texture Color No Abnormalities Noted: No No Abnormalities Noted: No Erythema: Yes Moisture Erythema Location: Circumferential No Abnormalities Noted: No Rubor: Yes Temperature / Pain Temperature: Hot Tenderness on Palpation: Yes Wound Preparation Ulcer Cleansing: Rinsed/Irrigated with Saline Topical Anesthetic Applied: Other: lidocaine 4%, Treatment Notes Wound #1 (Left, Medial Lower Leg) 1. Cleansed with: Clean wound  with Normal Saline 4. Dressing Applied: Other dressing (specify in notes) 5. Secondary Dressing Applied ABD Pad Kerlix/Conform 7. Secured with Tape Notes ace wrap Electronic Signature(s) Signed: 06/02/2017 4:32:30 PM By: Alric Quan Entered By: Alric Quan on 05/30/2017 13:18:47 Russell [name], Russell [name] ([id number]) -------------------------------------------------------------------------------- Wound Assessment Details Patient Name: Russell [name] Date of Service: 05/30/2017 12:30 PM Medical Record Number: [id number] Patient Account Number: [id number] Date of Birth/Sex: [birth year] (52 y.o. Male) Treating RN: Ahmed Prima Primary Care Henriette Hesser: Johny Drilling Other Clinician: Referring Ahniyah Giancola: Gladstone Lighter Treating Nami Strawder/Extender: Melburn Hake, HOYT Weeks in Treatment: 0 Wound Status Wound Number: 2 Primary Diabetic Wound/Ulcer of the Lower Extremity Etiology: Wound Location: Left Foot - Dorsal Wound Open Wounding Event: Gradually Appeared Status: Date Acquired: 11/29/2016 Comorbid Sleep Apnea, Arrhythmia, Congestive Heart Weeks Of Treatment: 0 History: Failure, Hypertension, Type I Diabetes, Clustered Wound: No Dementia Photos Photo Uploaded By: Roger Shelter on 05/30/2017 16:47:46 Wound Measurements Length: (cm) 1.8 Width: (cm) 1.5 Depth: (cm) 0.1 Area: (cm) 2.121 Volume: (cm) 0.212 % Reduction in Area: 0% % Reduction in Volume: 0% Epithelialization: None Tunneling: No Undermining: No Wound Description Classification: Grade 1 Wound Margin: Distinct, outline attached Exudate Amount: Large Exudate Type: Serous Exudate Color: amber Foul Odor After Cleansing: No Slough/Fibrino Yes Wound Bed Granulation Amount: Small (1-33%) Exposed Structure Granulation Quality: Red Fascia Exposed: No Necrotic Amount: Large (67-100%) Fat Layer (Subcutaneous Tissue) Exposed: No Necrotic Quality: Eschar, Adherent Slough Tendon Exposed: No Muscle Exposed:  No Joint Exposed: No Bone Exposed: No Periwound Skin Texture Legore, Jiles E. ([id number]) Texture Color No Abnormalities Noted: No No Abnormalities Noted: No Erythema: Yes Moisture Erythema Location: Circumferential No Abnormalities Noted: No Rubor: Yes Temperature / Pain Temperature: Hot Tenderness on Palpation: Yes Wound Preparation Ulcer Cleansing: Rinsed/Irrigated with Saline Topical Anesthetic Applied: Other: lidocaine 4%, Treatment Notes Wound #2 (Left, Dorsal Foot) 1. Cleansed with: Clean wound with Normal Saline 4. Dressing Applied: Other dressing (specify in notes) 5. Secondary Dressing Applied ABD Pad Kerlix/Conform 7. Secured with Tape Notes ace wrap Electronic Signature(s) Signed: 06/02/2017 4:32:30 PM By: Alric Quan Entered By: Alric Quan on 05/30/2017 13:18:58 Russell [name], Russell [name] ([id number]) -------------------------------------------------------------------------------- Wound Assessment Details Patient Name: Russell [name] Date of Service: 05/30/2017 12:30 PM Medical Record Number: [id number] Patient Account Number: [id number] Date of Birth/Sex: 1965/09/20 (52 y.o. Male) Treating RN: Ahmed Prima Primary Care Raymir Frommelt: Johny Drilling Other Clinician: Referring Carlon Davidson: Gladstone Lighter Treating Daleon Willinger/Extender: Melburn Hake, HOYT Weeks in Treatment: 0 Wound Status Wound Number: 3 Primary Diabetic Wound/Ulcer of the Lower Extremity Etiology: Wound Location: Left Foot - Lateral Wound  Open Wounding Event: Gradually Appeared Status: Date Acquired: 11/29/2016 Comorbid Sleep Apnea, Arrhythmia, Congestive Heart Weeks Of Treatment: 0 History: Failure, Hypertension, Type I Diabetes, Clustered Wound: No Dementia Photos Photo Uploaded By: Roger Shelter on 05/30/2017 16:48:15 Wound Measurements Length: (cm) 1.5 Width: (cm) 1.6 Depth: (cm) 0.1 Area: (cm) 1.885 Volume: (cm) 0.188 % Reduction in Area: 0% % Reduction in Volume:  0% Epithelialization: None Tunneling: No Undermining: No Wound Description Classification: Grade 1 Wound Margin: Distinct, outline attached Exudate Amount: Large Exudate Type: Serosanguineous Exudate Color: red, brown Foul Odor After Cleansing: No Slough/Fibrino Yes Wound Bed Granulation Amount: Large (67-100%) Exposed Structure Granulation Quality: Red Fascia Exposed: No Necrotic Amount: Small (1-33%) Fat Layer (Subcutaneous Tissue) Exposed: No Necrotic Quality: Adherent Slough Tendon Exposed: No Muscle Exposed: No Joint Exposed: No Bone Exposed: No Periwound Skin Texture Bieser, Kelechi E. ([id number]) Texture Color No Abnormalities Noted: No No Abnormalities Noted: No Erythema: Yes Moisture Erythema Location: Circumferential No Abnormalities Noted: No Maceration: Yes Temperature / Pain Temperature: Hot Tenderness on Palpation: Yes Wound Preparation Ulcer Cleansing: Rinsed/Irrigated with Saline Topical Anesthetic Applied: Other: lidocaine 4%, Treatment Notes Wound #3 (Left, Lateral Foot) 1. Cleansed with: Clean wound with Normal Saline 4. Dressing Applied: Other dressing (specify in notes) 5. Secondary Dressing Applied ABD Pad Kerlix/Conform 7. Secured with Tape Notes ace wrap Electronic Signature(s) Signed: 06/02/2017 4:32:30 PM By: Alric Quan Entered By: Alric Quan on 05/30/2017 13:19:06 Russell [name], Russell [name] ([id number]) -------------------------------------------------------------------------------- Wound Assessment Details Patient Name: Russell [name] Date of Service: 05/30/2017 12:30 PM Medical Record Number: [id number] Patient Account Number: [id number] Date of Birth/Sex: 18-Apr-1965 (52 y.o. Male) Treating RN: Ahmed Prima Primary Care Minh Jasper: Johny Drilling Other Clinician: Referring Arah Aro: Gladstone Lighter Treating Pepper Wyndham/Extender: Melburn Hake, HOYT Weeks in Treatment: 0 Wound Status Wound Number: 4 Primary Diabetic Wound/Ulcer  of the Lower Extremity Etiology: Wound Location: Right Lower Leg - Anterior Wound Open Wounding Event: Gradually Appeared Status: Date Acquired: 05/30/2017 Comorbid Sleep Apnea, Arrhythmia, Congestive Heart Weeks Of Treatment: 0 History: Failure, Hypertension, Type I Diabetes, Clustered Wound: Yes Dementia Photos Photo Uploaded By: Roger Shelter on 05/30/2017 16:49:03 Wound Measurements Length: (cm) 0.5 Width: (cm) 1.4 Depth: (cm) 0.1 Clustered Quantity: 2 Area: (cm) 0.55 Volume: (cm) 0.055 % Reduction in Area: % Reduction in Volume: Epithelialization: None Tunneling: No Undermining: No Wound Description Classification: Grade 1 Wound Margin: Distinct, outline attached Exudate Amount: Large Exudate Type: Serosanguineous Exudate Color: red, brown Foul Odor After Cleansing: No Slough/Fibrino Yes Wound Bed Granulation Amount: None Present (0%) Exposed Structure Necrotic Amount: Large (67-100%) Fascia Exposed: No Necrotic Quality: Eschar Fat Layer (Subcutaneous Tissue) Exposed: No Tendon Exposed: No Muscle Exposed: No Joint Exposed: No Bone Exposed: No Masih, Leray E. ([id number]) Periwound Skin Texture Texture Color No Abnormalities Noted: No No Abnormalities Noted: No Erythema: Yes Moisture Erythema Location: Circumferential No Abnormalities Noted: No Rubor: Yes Temperature / Pain Temperature: Hot Tenderness on Palpation: Yes Wound Preparation Ulcer Cleansing: Rinsed/Irrigated with Saline Topical Anesthetic Applied: Other: lidocaine 4%, Treatment Notes Wound #4 (Right, Anterior Lower Leg) 1. Cleansed with: Clean wound with Normal Saline 4. Dressing Applied: Other dressing (specify in notes) 5. Secondary Dressing Applied ABD Pad Kerlix/Conform 7. Secured with Tape Notes ace wrap Electronic Signature(s) Signed: 06/02/2017 4:32:30 PM By: Alric Quan Entered By: Alric Quan on 05/30/2017 13:18:38 [place], Russell [name]  ([id number]) -------------------------------------------------------------------------------- Wound Assessment Details Patient Name: Russell [name] Date of Service: 05/30/2017 12:30 PM Medical Record Number: [id number] Patient Account Number: [id number] Date of Birth/Sex: [birth year] (52 y.o. Male) Treating  RN: Ahmed Prima Primary Care Raedyn Wenke: Johny Drilling Other Clinician: Referring Aeric Burnham: Gladstone Lighter Treating Granvel Proudfoot/Extender: Melburn Hake, HOYT Weeks in Treatment: 0 Wound Status Wound Number: 5 Primary Diabetic Wound/Ulcer of the Lower Extremity Etiology: Wound Location: Left Lower Leg - Posterior Wound Open Wounding Event: Gradually Appeared Status: Date Acquired: 05/30/2017 Comorbid Sleep Apnea, Arrhythmia, Congestive Heart Weeks Of Treatment: 0 History: Failure, Hypertension, Type I Diabetes, Clustered Wound: Yes Dementia Photos Photo Uploaded By: Roger Shelter on 05/30/2017 16:49:04 Wound Measurements Length: (cm) 1.5 % Re Width: (cm) 2.1 % Re Depth: (cm) 0.1 Epit Clustered Quantity: 4 Tunn Area: (cm) 2.474 Und Volume: (cm) 0.247 duction in Area: duction in Volume: helialization: None eling: No ermining: No Wound Description Classification: Grade 1 Wound Margin: Distinct, outline attached Exudate Amount: Large Exudate Type: Serous Exudate Color: amber Foul Odor After Cleansing: No Slough/Fibrino Yes Wound Bed Granulation Amount: Large (67-100%) Exposed Structure Granulation Quality: Red Fascia Exposed: No Necrotic Amount: Small (1-33%) Fat Layer (Subcutaneous Tissue) Exposed: No Necrotic Quality: Adherent Slough Tendon Exposed: No Muscle Exposed: No Joint Exposed: No Bone Exposed: No Ream, Sante E. ([id number]) Periwound Skin Texture Texture Color No Abnormalities Noted: No No Abnormalities Noted: No Excoriation: Yes Erythema: Yes Erythema Location: Circumferential Moisture Rubor: Yes No Abnormalities Noted: No Maceration:  Yes Temperature / Pain Temperature: Hot Tenderness on Palpation: Yes Wound Preparation Ulcer Cleansing: Rinsed/Irrigated with Saline Topical Anesthetic Applied: Other: lidocaine 4%, Treatment Notes Wound #5 (Left, Posterior Lower Leg) 1. Cleansed with: Clean wound with Normal Saline 4. Dressing Applied: Other dressing (specify in notes) 5. Secondary Dressing Applied ABD Pad Kerlix/Conform 7. Secured with Tape Notes ace wrap Electronic Signature(s) Signed: 06/02/2017 4:32:30 PM By: Alric Quan Entered By: Alric Quan on 05/30/2017 13:23:21 [place], Russell [name] ([id number]) -------------------------------------------------------------------------------- [place] Details Patient Name: Russell [name] Date of Service: 05/30/2017 12:30 PM Medical Record Number: [id number] Patient Account Number: [id number] Date of Birth/Sex: 1965/07/23 (52 y.o. Male) Treating RN: Ahmed Prima Primary Care [place]: Johny Drilling Other Clinician: Referring Fedora Knisely: Gladstone Lighter Treating Daaiyah Baumert/Extender: Melburn Hake, HOYT Weeks in Treatment: 0 Vital Signs Time Taken: 12:38 Temperature (F): 98.0 Height (in): 71 Respiratory Rate (breaths/min): 20 Source: Stated Reference Range: 80 - 120 mg / dl Weight (lbs): 389.6 Source: Measured Body Mass Index (BMI): 54.3 Electronic Signature(s) Signed: 05/30/2017 5:00:26 PM By: Roger Shelter Entered By: Roger Shelter on 05/30/2017 13:41:41

## 2017-06-06 ENCOUNTER — Ambulatory Visit: Payer: Medicare Other | Admitting: Physician Assistant

## 2017-08-18 ENCOUNTER — Emergency Department: Payer: Medicare Other

## 2017-08-18 ENCOUNTER — Encounter: Payer: Self-pay | Admitting: Intensive Care

## 2017-08-18 ENCOUNTER — Other Ambulatory Visit: Payer: Self-pay

## 2017-08-18 ENCOUNTER — Emergency Department
Admission: EM | Admit: 2017-08-18 | Discharge: 2017-08-18 | Disposition: A | Discharge status: Home / Self Care | Payer: Medicare Other | Attending: Emergency Medicine | Admitting: Emergency Medicine

## 2017-08-18 DIAGNOSIS — Z79899 Other long term (current) drug therapy: Secondary | ICD-10-CM | POA: Diagnosis not present

## 2017-08-18 DIAGNOSIS — I10 Essential (primary) hypertension: Secondary | ICD-10-CM | POA: Insufficient documentation

## 2017-08-18 DIAGNOSIS — Z794 Long term (current) use of insulin: Secondary | ICD-10-CM | POA: Diagnosis not present

## 2017-08-18 DIAGNOSIS — R2242 Localized swelling, mass and lump, left lower limb: Secondary | ICD-10-CM | POA: Diagnosis present

## 2017-08-18 DIAGNOSIS — E119 Type 2 diabetes mellitus without complications: Secondary | ICD-10-CM | POA: Diagnosis not present

## 2017-08-18 DIAGNOSIS — M1712 Unilateral primary osteoarthritis, left knee: Secondary | ICD-10-CM | POA: Insufficient documentation

## 2017-08-18 DIAGNOSIS — I509 Heart failure, unspecified: Secondary | ICD-10-CM | POA: Diagnosis not present

## 2017-08-18 LAB — CBC
HCT: 40.1 % (ref 40.0–52.0)
Hemoglobin: 13.6 g/dL (ref 13.0–18.0)
MCH: 26.5 pg (ref 26.0–34.0)
MCHC: 33.9 g/dL (ref 32.0–36.0)
MCV: 77.9 fL — AB (ref 80.0–100.0)
PLATELETS: 190 10*3/uL (ref 150–440)
RBC: 5.14 MIL/uL (ref 4.40–5.90)
RDW: 15.4 % — AB (ref 11.5–14.5)
WBC: 7.8 10*3/uL (ref 3.8–10.6)

## 2017-08-18 LAB — COMPREHENSIVE METABOLIC PANEL
ALT: 19 U/L (ref 17–63)
AST: 23 U/L (ref 15–41)
Albumin: 3.6 g/dL (ref 3.5–5.0)
Alkaline Phosphatase: 103 U/L (ref 38–126)
Anion gap: 10 (ref 5–15)
BUN: 40 mg/dL — ABNORMAL HIGH (ref 6–20)
CHLORIDE: 103 mmol/L (ref 101–111)
CO2: 25 mmol/L (ref 22–32)
Calcium: 9 mg/dL (ref 8.9–10.3)
Creatinine, Ser: 1.37 mg/dL — ABNORMAL HIGH (ref 0.61–1.24)
GFR, EST NON AFRICAN AMERICAN: 57 mL/min — AB (ref >60)
Glucose, Bld: 177 mg/dL — ABNORMAL HIGH (ref 65–99)
POTASSIUM: 3.6 mmol/L (ref 3.5–5.1)
SODIUM: 138 mmol/L (ref 135–145)
Total Bilirubin: 0.5 mg/dL (ref 0.3–1.2)
Total Protein: 7.5 g/dL (ref 6.5–8.1)

## 2017-08-18 MED ORDER — OXYCODONE-ACETAMINOPHEN 5-325 MG PO TABS
1.0000 | ORAL_TABLET | Freq: Once | ORAL | Status: AC
  Administered 2017-08-18: 1 via ORAL
  Filled 2017-08-18: qty 1

## 2017-08-18 MED ORDER — TRAMADOL HCL 50 MG PO TABS: 50.0000 mg | ORAL_TABLET | Freq: Four times a day (QID) | ORAL | 0 refills | Status: DC | PRN

## 2017-08-18 MED ORDER — SODIUM CHLORIDE 0.9 % IV BOLUS
1000.0000 mL | Freq: Once | INTRAVENOUS | Status: AC
  Administered 2017-08-18: 1000 mL via INTRAVENOUS

## 2017-08-18 NOTE — ED Triage Notes (Signed)
Patient reports he is here to have his L knee looked at. Reports he has been walking on it too much and is now experiencing pain that started this AM. Amputation below R knee present.

## 2017-08-18 NOTE — ED Provider Notes (Signed)
[facility] Emergency Department Provider Note  ____________________________________________  Time seen: Approximately 7:14 PM  I have reviewed the triage vital signs and the nursing notes.   HISTORY  Chief Complaint Knee Pain (Left)    HPI Russell [name] is a 52 y.o. male presents to emergency department for evaluation of left knee pain for 1 day.  Pain is over the front and outsideside of his knee.  He has been doing more walking, which he thinks is what is causing the pain.  He states that the pain is in the bones.  He has healing ulcers to lower extremities for which he sees wound care 2-3 times per week.  He changes his dressings daily.  He states that about 1 month ago he was in the hospital for pain and swelling to his lower left leg and this has significantly improved since.  He denies any pain in his lower leg currently.  No trauma.  No fever, chills.   Past Medical History:  Diagnosis Date  . CHF (congestive heart failure) (HCC)    EF 30-35%  . Depression   . Diabetes mellitus without complication ([place])   . Hyperlipemia   . Hypertension     Patient Active Problem List   Diagnosis Date Noted  . Cellulitis 05/18/2017  . Cellulitis and abscess of left leg 08/08/2016  . Diabetes with ulcer of leg ([place]) 08/08/2016  . Hyperglycemia 08/08/2016  . Morbid obesity ([place]) 08/08/2016  . Cellulitis and abscess of leg 08/08/2016  . Adjustment disorder with depressed mood 11/21/2014  . Other social stressor 11/21/2014  . 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 Diabetes ([place]) 11/21/2014    Past Surgical History:  Procedure Laterality Date  . APPENDECTOMY    . BELOW KNEE LEG AMPUTATION     Right leg  . TONSILLECTOMY      Prior to Admission medications   Medication Sig Start Date End Date Taking? Authorizing Provider  atorvastatin (LIPITOR) 40 MG tablet Take 40 mg by mouth daily.     [provider]  cephALEXin (KEFLEX) 500 MG  capsule Take 1 capsule (500 mg total) by mouth every 8 (eight) hours. 05/20/17   Fritzi Mandes, MD  furosemide (LASIX) 40 MG tablet Take 1 tablet (40 mg total) by mouth daily. 08/12/16   Vaughan Basta, MD  insulin regular (NOVOLIN R,HUMULIN R) 100 units/mL injection Inject 40-100 Units into the skin 3 (three) times daily before meals.    [provider]  lisinopril (PRINIVIL,ZESTRIL) 10 MG tablet Take 10 mg by mouth daily.    [provider]  pregabalin (LYRICA) 100 MG capsule Take 100 mg by mouth 3 (three) times daily.    [provider]  traMADol (ULTRAM) 50 MG tablet Take 1 tablet (50 mg total) by mouth every 6 (six) hours as needed. 08/18/17 08/18/18  Laban Emperor, PA-C    Allergies Shellfish allergy  Family History  Problem Relation Age of Onset  . COPD Mother   . Diabetes Father   . CAD Father   . Kidney cancer Brother     Social History Social History   Tobacco Use  . Smoking status: Never Smoker  . Smokeless tobacco: Never Used  Substance Use Topics  . Alcohol use: No  . Drug use: No     Review of Systems  Constitutional: No fever/chills ENT: No upper respiratory complaints. Cardiovascular: No chest pain. Respiratory: No cough. No SOB. Gastrointestinal: No abdominal pain.  No nausea, no vomiting.  Musculoskeletal: Positive for knee pain. Skin: Negative for abrasions, lacerations, ecchymosis.   ____________________________________________   PHYSICAL EXAM:  VITAL SIGNS: ED Triage Vitals  Enc Vitals Group     BP --      Pulse Rate 08/18/17 1839 76     Resp 08/18/17 1839 20     Temp 08/18/17 1839 98.5 F (36.9 C)     Temp Source 08/18/17 1839 Oral     SpO2 08/18/17 1839 94 %     Weight 08/18/17 1842 (!) 383 lb (173.7 kg)     Height 08/18/17 1842 5\' 11"  (1.803 m)     Head Circumference --      Peak Flow --      Pain Score 08/18/17 1841 9     Pain Loc --      Pain Edu? --      Excl. in [place]? --      Constitutional:  Alert and oriented. Well appearing and in no acute distress. Eyes: Conjunctivae are normal. PERRL. EOMI. Head: Atraumatic. ENT:      Ears:      Nose: No congestion/rhinnorhea.      Mouth/Throat: Mucous membranes are moist.  Neck: No stridor.   Cardiovascular: Normal rate, regular rhythm.  Good peripheral circulation. Respiratory: Normal respiratory effort without tachypnea or retractions. Lungs CTAB. Good air entry to the bases with no decreased or absent breath sounds. Musculoskeletal: Full range of motion to all extremities. No gross deformities appreciated.  Pain with range of motion of left knee.  Tenderness to palpation over patella and lateral knee.  No overlying erythema or wounds.  Dime size ulcer to shin, lower calf, top of foot.  No drainage or malodor.  No tenderness to palpation.  Erythema to lower left leg, which patient states is chronic. No warmth.  Neurologic:  Normal speech and language. No gross focal neurologic deficits are appreciated.  Skin:  Skin is warm, dry and intact. No rash noted. Psychiatric: Mood and affect are normal. Speech and behavior are normal. Patient exhibits appropriate insight and judgement.   ____________________________________________   LABS (all labs ordered are listed, but only abnormal results are displayed)  Labs Reviewed  CBC - Abnormal; Notable for the following components:      Result Value   MCV 77.9 (*)    RDW 15.4 (*)    All other components within normal limits  COMPREHENSIVE METABOLIC PANEL - Abnormal; Notable for the following components:   Glucose, Bld 177 (*)    BUN 40 (*)    Creatinine, Ser 1.37 (*)    GFR calc non Af Amer 57 (*)    All other components within normal limits   ____________________________________________  EKG   ____________________________________________  RADIOLOGY Robinette Haines, personally viewed and evaluated these images (plain radiographs) as part of my medical decision making, as well as  reviewing the written report by the radiologist.  US Venous Img Lower Unilateral Left  Result Date: 08/18/2017 CLINICAL DATA:  Pain and swelling in the left lower extremity. Chronic diabetic ulcers. EXAM: Left LOWER EXTREMITY VENOUS DOPPLER ULTRASOUND TECHNIQUE: Gray-scale sonography with graded compression, as well as color Doppler and duplex ultrasound were performed to evaluate the lower extremity deep venous systems from the level of the common femoral vein and including the common femoral, femoral, profunda femoral, popliteal and calf veins including the posterior tibial, peroneal and gastrocnemius veins when visible. The superficial great saphenous vein was also interrogated. Spectral Doppler was utilized to evaluate flow at  rest and with distal augmentation maneuvers in the common femoral, femoral and popliteal veins. COMPARISON:  None. FINDINGS: Contralateral Common Femoral Vein: Respiratory phasicity is normal and symmetric with the symptomatic side. No evidence of thrombus. Normal compressibility. Common Femoral Vein: No evidence of thrombus. Normal compressibility, respiratory phasicity and response to augmentation. Saphenofemoral Junction: No evidence of thrombus. Normal compressibility and flow on color Doppler imaging. Profunda Femoral Vein: No evidence of thrombus. Normal compressibility and flow on color Doppler imaging. Femoral Vein: No evidence of thrombus. Normal compressibility, respiratory phasicity and response to augmentation. Popliteal Vein: No evidence of thrombus. Normal compressibility, respiratory phasicity and response to augmentation. Calf Veins: Limited visualization. No evidence of thrombus. Normal flow on color Doppler imaging. Superficial Great Saphenous Vein: No evidence of thrombus. Normal compressibility. Venous Reflux:  None. Other Findings: Examination is somewhat technically limited due to patient's body habitus. IMPRESSION: No evidence of deep venous thrombosis.  Electronically Signed   By: Lucienne Capers M.D.   On: 08/18/2017 22:05   Dg Knee Complete 4 Views Left  Result Date: 08/18/2017 CLINICAL DATA:  Patient reports he is here to have his L knee looked at. Reports he has been walking on it too much and is now experiencing pain that started this AM. Amputation below R knee present. Unable to obtain internal oblique view due to PT condition EXAM: LEFT KNEE - COMPLETE 4+ VIEW COMPARISON:  None. FINDINGS: There is mild narrowing of the MEDIAL, LATERAL, and patellofemoral compartments. The evaluation for joint effusion is somewhat limited by positioning secondary to patient's pain. IMPRESSION: Mild degenerative changes.  No acute fracture. Electronically Signed   By: Nolon Nations M.D.   On: 08/18/2017 20:01    ____________________________________________    PROCEDURES  Procedure(s) performed:    Procedures    Medications  oxyCODONE-acetaminophen (PERCOCET/ROXICET) 5-325 MG per tablet 1 tablet (1 tablet Oral Given 08/18/17 2029)  sodium chloride 0.9 % bolus 1,000 mL (0 mLs Intravenous Stopped 08/18/17 2235)  oxyCODONE-acetaminophen (PERCOCET/ROXICET) 5-325 MG per tablet 1 tablet (1 tablet Oral Given 08/18/17 2235)     ____________________________________________   INITIAL IMPRESSION / ASSESSMENT AND PLAN / ED COURSE  Pertinent labs & imaging results that were available during my care of the patient were reviewed by me and considered in my medical decision making (see chart for details).  Review of the [place] CSRS was performed in accordance of the [place] prior to dispensing any controlled drugs.     Patient's diagnosis is consistent with osteoarthritis.  Patient has ulcers to lower extremity, which she has had for about a year.  He sees wound care for these.  No indication of infection.  X-ray consistent with osteoarthritis.  Ultrasound negative for DVT.  Lab work reassuring.  Creatinine, BUN, GFR are consistent with previous 1 month ago.   He was given a liter of fluids.  Pain improved significantly with Percocet and patient was able to move his knee with minimal pain. He was moving himself around in bed. Patient will be discharged home with prescriptions for tramadol. Patient is to follow up with PCP as directed. Patient is given ED precautions to return to the ED for any worsening or new symptoms.     ____________________________________________  FINAL CLINICAL IMPRESSION(S) / ED DIAGNOSES  Final diagnoses:  Osteoarthritis of left knee, unspecified osteoarthritis type      NEW MEDICATIONS STARTED DURING THIS VISIT:  ED Discharge Orders        Ordered    traMADol (ULTRAM) 50 MG  tablet  Every 6 hours PRN     08/18/17 2228          This chart was dictated using voice recognition software/Dragon. Despite best efforts to proofread, errors can occur which can change the meaning. Any change was purely unintentional.    Laban Emperor, PA-C 08/18/17 2301    Arta Silence, MD 08/19/17 [phone]

## 2017-08-18 NOTE — ED Notes (Signed)
Pt to US with tech

## 2017-11-17 ENCOUNTER — Ambulatory Visit: Payer: Medicare Other | Attending: Neurology

## 2017-11-17 DIAGNOSIS — G4761 Periodic limb movement disorder: Secondary | ICD-10-CM | POA: Insufficient documentation

## 2017-11-17 DIAGNOSIS — G4733 Obstructive sleep apnea (adult) (pediatric): Secondary | ICD-10-CM | POA: Diagnosis present

## 2018-02-01 ENCOUNTER — Other Ambulatory Visit: Payer: Self-pay

## 2018-02-01 ENCOUNTER — Emergency Department: Payer: Medicare Other

## 2018-02-01 ENCOUNTER — Encounter: Payer: Self-pay | Admitting: Emergency Medicine

## 2018-02-01 ENCOUNTER — Inpatient Hospital Stay
Admission: EM | Admit: 2018-02-01 | Discharge: 2018-02-02 | DRG: 580 | Disposition: A | Discharge status: Home / Self Care | Payer: Medicare Other | Attending: Internal Medicine | Admitting: Internal Medicine

## 2018-02-01 DIAGNOSIS — Z89511 Acquired absence of right leg below knee: Secondary | ICD-10-CM | POA: Diagnosis not present

## 2018-02-01 DIAGNOSIS — L97529 Non-pressure chronic ulcer of other part of left foot with unspecified severity: Secondary | ICD-10-CM | POA: Diagnosis not present

## 2018-02-01 DIAGNOSIS — Z79899 Other long term (current) drug therapy: Secondary | ICD-10-CM

## 2018-02-01 DIAGNOSIS — T368X5A Adverse effect of other systemic antibiotics, initial encounter: Secondary | ICD-10-CM | POA: Diagnosis not present

## 2018-02-01 DIAGNOSIS — L97329 Non-pressure chronic ulcer of left ankle with unspecified severity: Secondary | ICD-10-CM | POA: Diagnosis present

## 2018-02-01 DIAGNOSIS — N183 Chronic kidney disease, stage 3 (moderate): Secondary | ICD-10-CM | POA: Diagnosis not present

## 2018-02-01 DIAGNOSIS — L03116 Cellulitis of left lower limb: Secondary | ICD-10-CM | POA: Diagnosis present

## 2018-02-01 DIAGNOSIS — Z9114 Patient's other noncompliance with medication regimen: Secondary | ICD-10-CM

## 2018-02-01 DIAGNOSIS — E11622 Type 2 diabetes mellitus with other skin ulcer: Secondary | ICD-10-CM | POA: Diagnosis present

## 2018-02-01 DIAGNOSIS — E785 Hyperlipidemia, unspecified: Secondary | ICD-10-CM | POA: Diagnosis present

## 2018-02-01 DIAGNOSIS — I5042 Chronic combined systolic (congestive) and diastolic (congestive) heart failure: Secondary | ICD-10-CM | POA: Diagnosis not present

## 2018-02-01 DIAGNOSIS — Z794 Long term (current) use of insulin: Secondary | ICD-10-CM

## 2018-02-01 DIAGNOSIS — L270 Generalized skin eruption due to drugs and medicaments taken internally: Secondary | ICD-10-CM | POA: Diagnosis not present

## 2018-02-01 DIAGNOSIS — I130 Hypertensive heart and chronic kidney disease with heart failure and stage 1 through stage 4 chronic kidney disease, or unspecified chronic kidney disease: Secondary | ICD-10-CM | POA: Diagnosis present

## 2018-02-01 DIAGNOSIS — Z6841 Body Mass Index (BMI) 40.0 and over, adult: Secondary | ICD-10-CM | POA: Diagnosis not present

## 2018-02-01 DIAGNOSIS — E1122 Type 2 diabetes mellitus with diabetic chronic kidney disease: Secondary | ICD-10-CM | POA: Diagnosis not present

## 2018-02-01 DIAGNOSIS — E1140 Type 2 diabetes mellitus with diabetic neuropathy, unspecified: Secondary | ICD-10-CM | POA: Diagnosis present

## 2018-02-01 DIAGNOSIS — F329 Major depressive disorder, single episode, unspecified: Secondary | ICD-10-CM | POA: Diagnosis not present

## 2018-02-01 DIAGNOSIS — E1165 Type 2 diabetes mellitus with hyperglycemia: Secondary | ICD-10-CM | POA: Diagnosis present

## 2018-02-01 LAB — COMPREHENSIVE METABOLIC PANEL
ALT: 16 U/L (ref 0–44)
AST: 24 U/L (ref 15–41)
Albumin: 3.6 g/dL (ref 3.5–5.0)
Alkaline Phosphatase: 107 U/L (ref 38–126)
Anion gap: 10 (ref 5–15)
BUN: 57 mg/dL — ABNORMAL HIGH (ref 6–20)
CO2: 30 mmol/L (ref 22–32)
Calcium: 8.9 mg/dL (ref 8.9–10.3)
Chloride: 95 mmol/L — ABNORMAL LOW (ref 98–111)
Creatinine, Ser: 1.6 mg/dL — ABNORMAL HIGH (ref 0.61–1.24)
GFR calc Af Amer: 56 mL/min — ABNORMAL LOW (ref >60)
GFR calc non Af Amer: 48 mL/min — ABNORMAL LOW (ref >60)
Glucose, Bld: 139 mg/dL — ABNORMAL HIGH (ref 70–99)
Potassium: 4.2 mmol/L (ref 3.5–5.1)
Sodium: 135 mmol/L (ref 135–145)
Total Bilirubin: 0.5 mg/dL (ref 0.3–1.2)
Total Protein: 7.6 g/dL (ref 6.5–8.1)

## 2018-02-01 LAB — CBC WITH DIFFERENTIAL/PLATELET
Abs Immature Granulocytes: 0.07 10*3/uL (ref 0.00–0.07)
Basophils Absolute: 0 10*3/uL (ref 0.0–0.1)
Basophils Relative: 0 %
EOS PCT: 5 %
Eosinophils Absolute: 0.4 10*3/uL (ref 0.0–0.5)
HCT: 37.6 % — ABNORMAL LOW (ref 39.0–52.0)
HEMOGLOBIN: 12.3 g/dL — AB (ref 13.0–17.0)
Immature Granulocytes: 1 %
Lymphocytes Relative: 18 %
Lymphs Abs: 1.5 10*3/uL (ref 0.7–4.0)
MCH: 25.9 pg — ABNORMAL LOW (ref 26.0–34.0)
MCHC: 32.7 g/dL (ref 30.0–36.0)
MCV: 79.3 fL — ABNORMAL LOW (ref 80.0–100.0)
MONO ABS: 1 10*3/uL (ref 0.1–1.0)
Monocytes Relative: 12 %
Neutro Abs: 5.4 10*3/uL (ref 1.7–7.7)
Neutrophils Relative %: 64 %
Platelets: 215 10*3/uL (ref 150–400)
RBC: 4.74 MIL/uL (ref 4.22–5.81)
RDW: 14.5 % (ref 11.5–15.5)
WBC: 8.4 10*3/uL (ref 4.0–10.5)
nRBC: 0 % (ref 0.0–0.2)

## 2018-02-01 LAB — HEMOGLOBIN A1C
Hgb A1c MFr Bld: 9.8 % — ABNORMAL HIGH (ref 4.8–5.6)
Mean Plasma Glucose: 234.56 mg/dL

## 2018-02-01 LAB — GLUCOSE, CAPILLARY
Glucose-Capillary: 170 mg/dL — ABNORMAL HIGH (ref 70–99)
Glucose-Capillary: 304 mg/dL — ABNORMAL HIGH (ref 70–99)
Glucose-Capillary: 379 mg/dL — ABNORMAL HIGH (ref 70–99)
Glucose-Capillary: 439 mg/dL — ABNORMAL HIGH (ref 70–99)

## 2018-02-01 LAB — TSH: TSH: 4.105 u[IU]/mL (ref 0.350–4.500)

## 2018-02-01 LAB — CG4 I-STAT (LACTIC ACID): Lactic Acid, Venous: 1.21 mmol/L (ref 0.5–1.9)

## 2018-02-01 MED ORDER — HEPARIN SODIUM (PORCINE) 5000 UNIT/ML IJ SOLN
5000.0000 [IU] | Freq: Three times a day (TID) | INTRAMUSCULAR | Status: DC
  Filled 2018-02-01: qty 1

## 2018-02-01 MED ORDER — INSULIN ASPART 100 UNIT/ML ~~LOC~~ SOLN
48.0000 [IU] | Freq: Three times a day (TID) | SUBCUTANEOUS | Status: DC
  Filled 2018-02-01: qty 1

## 2018-02-01 MED ORDER — COLLAGENASE 250 UNIT/GM EX OINT
TOPICAL_OINTMENT | Freq: Every day | CUTANEOUS | Status: DC
  Administered 2018-02-02: 14:00:00 via TOPICAL
  Filled 2018-02-01 (×2): qty 30

## 2018-02-01 MED ORDER — SODIUM CHLORIDE 0.9 % IV SOLN: INTRAVENOUS | Status: DC | PRN

## 2018-02-01 MED ORDER — ONDANSETRON HCL 4 MG PO TABS: 4.0000 mg | ORAL_TABLET | Freq: Four times a day (QID) | ORAL | Status: DC | PRN

## 2018-02-01 MED ORDER — INSULIN ASPART 100 UNIT/ML ~~LOC~~ SOLN
0.0000 [IU] | Freq: Three times a day (TID) | SUBCUTANEOUS | Status: DC
  Administered 2018-02-01: 15 [IU] via SUBCUTANEOUS
  Filled 2018-02-01: qty 1

## 2018-02-01 MED ORDER — VANCOMYCIN HCL 10 G IV SOLR
1500.0000 mg | Freq: Two times a day (BID) | INTRAVENOUS | Status: DC
  Filled 2018-02-01: qty 1500

## 2018-02-01 MED ORDER — CARVEDILOL 6.25 MG PO TABS
6.2500 mg | ORAL_TABLET | Freq: Two times a day (BID) | ORAL | Status: DC
Start: 2018-02-01 — End: 2018-02-02
  Administered 2018-02-02: 6.25 mg via ORAL
  Filled 2018-02-01 (×2): qty 1
  Filled 2018-02-01: qty 2
  Filled 2018-02-01 (×2): qty 1
  Filled 2018-02-01: qty 2

## 2018-02-01 MED ORDER — KETOTIFEN FUMARATE 0.025 % OP SOLN
1.0000 [drp] | Freq: Two times a day (BID) | OPHTHALMIC | Status: DC
  Filled 2018-02-01: qty 5

## 2018-02-01 MED ORDER — DOCUSATE SODIUM 100 MG PO CAPS
100.0000 mg | ORAL_CAPSULE | Freq: Two times a day (BID) | ORAL | Status: DC
  Administered 2018-02-01 (×2): 100 mg via ORAL
  Filled 2018-02-01 (×2): qty 1

## 2018-02-01 MED ORDER — INSULIN ASPART 100 UNIT/ML ~~LOC~~ SOLN
0.0000 [IU] | Freq: Every day | SUBCUTANEOUS | Status: DC
  Administered 2018-02-01: 5 [IU] via SUBCUTANEOUS
  Filled 2018-02-01: qty 1

## 2018-02-01 MED ORDER — POTASSIUM CHLORIDE CRYS ER 20 MEQ PO TBCR
20.0000 meq | EXTENDED_RELEASE_TABLET | Freq: Two times a day (BID) | ORAL | Status: DC
  Administered 2018-02-01: 20 meq via ORAL
  Filled 2018-02-01 (×2): qty 1

## 2018-02-01 MED ORDER — VANCOMYCIN HCL 10 G IV SOLR
1500.0000 mg | Freq: Two times a day (BID) | INTRAVENOUS | Status: DC
  Administered 2018-02-01: 1500 mg via INTRAVENOUS
  Filled 2018-02-01 (×3): qty 1500

## 2018-02-01 MED ORDER — OXYMETAZOLINE HCL 0.05 % NA SOLN
1.0000 | Freq: Two times a day (BID) | NASAL | Status: DC
  Administered 2018-02-01: 1 via NASAL
  Filled 2018-02-01: qty 15

## 2018-02-01 MED ORDER — LISINOPRIL 10 MG PO TABS
10.0000 mg | ORAL_TABLET | Freq: Every day | ORAL | Status: DC
  Administered 2018-02-02: 10 mg via ORAL
  Filled 2018-02-01: qty 1

## 2018-02-01 MED ORDER — VANCOMYCIN HCL IN DEXTROSE 1-5 GM/200ML-% IV SOLN
1000.0000 mg | Freq: Once | INTRAVENOUS | Status: AC
  Administered 2018-02-01: 1000 mg via INTRAVENOUS
  Filled 2018-02-01: qty 200

## 2018-02-01 MED ORDER — DIPHENHYDRAMINE HCL 25 MG PO CAPS
25.0000 mg | ORAL_CAPSULE | Freq: Four times a day (QID) | ORAL | Status: DC | PRN
  Filled 2018-02-01: qty 1

## 2018-02-01 MED ORDER — DIPHENHYDRAMINE HCL 50 MG/ML IJ SOLN
25.0000 mg | Freq: Once | INTRAMUSCULAR | Status: AC
  Administered 2018-02-01: 25 mg via INTRAVENOUS
  Filled 2018-02-01: qty 1

## 2018-02-01 MED ORDER — METOLAZONE 5 MG PO TABS
10.0000 mg | ORAL_TABLET | Freq: Every day | ORAL | Status: DC
  Filled 2018-02-01 (×2): qty 2

## 2018-02-01 MED ORDER — ACETAMINOPHEN 650 MG RE SUPP: 650.0000 mg | Freq: Four times a day (QID) | RECTAL | Status: DC | PRN

## 2018-02-01 MED ORDER — ACETAMINOPHEN 325 MG PO TABS
650.0000 mg | ORAL_TABLET | Freq: Four times a day (QID) | ORAL | Status: DC | PRN
  Administered 2018-02-01: 650 mg via ORAL
  Filled 2018-02-01: qty 2

## 2018-02-01 MED ORDER — INSULIN ASPART 100 UNIT/ML ~~LOC~~ SOLN
10.0000 [IU] | Freq: Three times a day (TID) | SUBCUTANEOUS | Status: DC
  Filled 2018-02-01: qty 1

## 2018-02-01 MED ORDER — VENLAFAXINE HCL ER 75 MG PO CP24
75.0000 mg | ORAL_CAPSULE | Freq: Every day | ORAL | Status: DC
  Filled 2018-02-01 (×2): qty 1

## 2018-02-01 MED ORDER — ATORVASTATIN CALCIUM 20 MG PO TABS
40.0000 mg | ORAL_TABLET | Freq: Every day | ORAL | Status: DC
  Administered 2018-02-01 – 2018-02-02 (×2): 40 mg via ORAL
  Filled 2018-02-01 (×2): qty 2

## 2018-02-01 MED ORDER — PREGABALIN 75 MG PO CAPS
150.0000 mg | ORAL_CAPSULE | Freq: Three times a day (TID) | ORAL | Status: DC
  Administered 2018-02-01 – 2018-02-02 (×4): 150 mg via ORAL
  Filled 2018-02-01 (×5): qty 2

## 2018-02-01 MED ORDER — DULOXETINE HCL 60 MG PO CPEP
60.0000 mg | ORAL_CAPSULE | Freq: Every day | ORAL | Status: DC
  Filled 2018-02-01 (×2): qty 1

## 2018-02-01 MED ORDER — SODIUM CHLORIDE 0.9 % IV SOLN
2.0000 g | INTRAVENOUS | Status: DC
  Administered 2018-02-01 – 2018-02-02 (×2): 2 g via INTRAVENOUS
  Filled 2018-02-01: qty 20
  Filled 2018-02-01: qty 2
  Filled 2018-02-01: qty 20

## 2018-02-01 MED ORDER — SENNOSIDES-DOCUSATE SODIUM 8.6-50 MG PO TABS
2.0000 | ORAL_TABLET | Freq: Two times a day (BID) | ORAL | Status: DC
  Administered 2018-02-01 (×2): 2 via ORAL
  Filled 2018-02-01 (×2): qty 2

## 2018-02-01 MED ORDER — ONDANSETRON HCL 4 MG/2ML IJ SOLN: 4.0000 mg | Freq: Four times a day (QID) | INTRAMUSCULAR | Status: DC | PRN

## 2018-02-01 MED ORDER — TORSEMIDE 20 MG PO TABS: 20.0000 mg | ORAL_TABLET | Freq: Every day | ORAL | Status: DC

## 2018-02-01 MED ORDER — INSULIN ASPART 100 UNIT/ML ~~LOC~~ SOLN
0.0000 [IU] | Freq: Three times a day (TID) | SUBCUTANEOUS | Status: DC
  Administered 2018-02-02 (×2): 20 [IU] via SUBCUTANEOUS
  Filled 2018-02-01 (×3): qty 1

## 2018-02-01 MED ORDER — VANCOMYCIN HCL 10 G IV SOLR
1500.0000 mg | Freq: Two times a day (BID) | INTRAVENOUS | Status: DC
  Administered 2018-02-01: 1500 mg via INTRAVENOUS
  Filled 2018-02-01 (×4): qty 1500

## 2018-02-01 MED ORDER — CLOTRIMAZOLE 1 % EX CREA
1.0000 "application " | TOPICAL_CREAM | Freq: Two times a day (BID) | CUTANEOUS | Status: DC
  Filled 2018-02-01: qty 15

## 2018-02-01 NOTE — Care Management Note (Addendum)
Case Management Note  Patient Details  Name: Russell [name] MRN: [id number] Date of Birth: 08/15/1965  Subjective/Objective:                  RNCM met with patient to discuss discharge planning. He states he cannot pay for doctor visits and home health. He lives alone. He depends on his ex-wife and daughter for transportation.  He states he owes Dr. Anderson/[facility] and they will not see him until it is paid.  He kept saying, "just cut it off. I can't afford all these doctor visits and treatment of something that's not healing".  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    Action/Plan:   RNCM has requested funds from [facility] [employer].  I have reached out to Jason with [facility] to see what patient's co-pays will be.  Patient has been asked to call [facility] to find out what his outstanding balance is with them.   Expected Discharge Date:                  Expected Discharge Plan:     In-House Referral:     Discharge planning Services  CM Consult  Post Acute Care Choice:  Home Health Choice offered to:  Patient  DME Arranged:    DME Agency:     HH Arranged:  RN HH Agency:  [facility]  Status of Service:  In process, will continue to follow  If discussed at Long Length of Stay Meetings, dates discussed:    Additional Comments:  $870.47 owed to [facility]   , RN 02/01/2018, 1:55 PM  

## 2018-02-01 NOTE — ED Triage Notes (Signed)
Pt arrived to the ED for complaints of left leg pain and swelling. Pt states that he has been experiencing problems with his leg to include an ulcer and neuropathy. Pt is AOx4 in no apparent distress with a 70mm stage 2 ulcer to the top of the left foot. Dr. Owens Shark at bedside on arrival.

## 2018-02-01 NOTE — Progress Notes (Signed)
CODE SEPSIS - PHARMACY COMMUNICATION  **Broad Spectrum Antibiotics should be administered within 1 hour of Sepsis diagnosis**  Time Code Sepsis Called/Page Received: 0259  Antibiotics Ordered: vanc/ceftriaxone  Time of 1st antibiotic administration: 0345  Additional action taken by pharmacy:   If necessary, Name of Provider/Nurse Contacted:     Tobie Lords ,PharmD Clinical Pharmacist  02/01/2018  4:07 AM

## 2018-02-01 NOTE — H&P (Signed)
Russell [name] is an 52 y.o. male.   Chief Complaint: Leg swelling HPI: The patient with past medical history of diabetes, hypertension and CHF resents to the emergency department complaining of cellulitis of his left lower extremity.  The patient wears a compression sleeve around his lower leg but has noticed that his foot has turned purple and has developed a wound.  He cannot feel the wound as he has severe diabetic nephropathy.  He is status post BKA of the other leg.  The patient was given ceftriaxone and vancomycin in the emergency department prior to the hospitalist service being called for admission.  Past Medical History:  Diagnosis Date  . CHF (congestive heart failure) (HCC)    EF 30-35%  . Depression   . Diabetes mellitus without complication ([place])   . Hyperlipemia   . Hypertension     Past Surgical History:  Procedure Laterality Date  . APPENDECTOMY    . BELOW KNEE LEG AMPUTATION     Right leg  . TONSILLECTOMY      Family History  Problem Relation Age of Onset  . COPD Mother   . Diabetes Father   . CAD Father   . Kidney cancer Brother    Social History:  reports that he has never smoked. He has never used smokeless tobacco. He reports that he does not drink alcohol or use drugs.  Allergies:  Allergies  Allergen Reactions  . Shellfish Allergy Hives    Prior to Admission medications   Medication Sig Start Date End Date Taking? Authorizing Provider  atorvastatin (LIPITOR) 40 MG tablet Take 40 mg by mouth daily.    Yes [provider]  azelastine (OPTIVAR) 0.05 % ophthalmic solution Place 1 drop into both eyes 2 (two) times daily.   Yes [provider]  carvedilol (COREG) 6.25 MG tablet Take 6.25 mg by mouth 2 (two) times daily with a meal.   Yes [provider]  clotrimazole (LOTRIMIN) 1 % cream Apply 1 application topically 2 (two) times daily.   Yes [provider]  Dulaglutide (TRULICITY) 1.5 GM/0.1UU SOPN Inject 1.5 mg into  the skin once a week.   Yes [provider]  DULoxetine (CYMBALTA) 60 MG capsule Take 60 mg by mouth daily. 11/08/17  Yes [provider]  insulin regular (NOVOLIN R,HUMULIN R) 100 units/mL injection Inject 80 Units into the skin 3 (three) times daily before meals.    Yes [provider]  lisinopril (PRINIVIL,ZESTRIL) 10 MG tablet Take 10 mg by mouth daily.   Yes [provider]  metolazone (ZAROXOLYN) 10 MG tablet Take 10 mg by mouth daily. 11/09/17  Yes [provider]  potassium chloride SA (K-DUR,KLOR-CON) 20 MEQ tablet Take 20 mEq by mouth 2 (two) times daily. 11/08/17  Yes [provider]  pregabalin (LYRICA) 150 MG capsule Take 150 mg by mouth 3 (three) times daily.    Yes [provider]  senna-docusate (SENOKOT-S) 8.6-50 MG tablet Take 2 tablets by mouth 2 (two) times daily.   Yes [provider]  torsemide (DEMADEX) 20 MG tablet Take 20 mg by mouth daily.   Yes [provider]  venlafaxine XR (EFFEXOR-XR) 75 MG 24 hr capsule Take 75 mg by mouth daily with breakfast.   Yes [provider]     Results for orders placed or performed during the hospital encounter of 02/01/18 (from the past 48 hour(s))  Comprehensive metabolic panel     Status: Abnormal   Collection Time:  02/01/18  3:13 AM  Result Value Ref Range   Sodium 135 135 - 145 mmol/L   Potassium 4.2 3.5 - 5.1 mmol/L   Chloride 95 (L) 98 - 111 mmol/L   CO2 30 22 - 32 mmol/L   Glucose, Bld 139 (H) 70 - 99 mg/dL   BUN 57 (H) 6 - 20 mg/dL   Creatinine, Ser 1.60 (H) 0.61 - 1.24 mg/dL   Calcium 8.9 8.9 - 10.3 mg/dL   Total Protein 7.6 6.5 - 8.1 g/dL   Albumin 3.6 3.5 - 5.0 g/dL   AST 24 15 - 41 U/L   ALT 16 0 - 44 U/L   Alkaline Phosphatase 107 38 - 126 U/L   Total Bilirubin 0.5 0.3 - 1.2 mg/dL   GFR calc non Af Amer 48 (L) >60 mL/min   GFR calc Af Amer 56 (L) >60 mL/min   Anion gap 10 5 - 15    Comment: Performed at [facility],  [address] [place], [place] [address]  CBC with Differential/Platelet     Status: Abnormal   Collection Time: 02/01/18  5:30 AM  Result Value Ref Range   WBC 8.4 4.0 - 10.5 K/uL   RBC 4.74 4.22 - 5.81 MIL/uL   Hemoglobin 12.3 (L) 13.0 - 17.0 g/dL   HCT 37.6 (L) 39.0 - 52.0 %   MCV 79.3 (L) 80.0 - 100.0 fL   MCH 25.9 (L) 26.0 - 34.0 pg   MCHC 32.7 30.0 - 36.0 g/dL   RDW 14.5 11.5 - 15.5 %   Platelets 215 150 - 400 K/uL   nRBC 0.0 0.0 - 0.2 %   Neutrophils Relative % 64 %   Neutro Abs 5.4 1.7 - 7.7 K/uL   Lymphocytes Relative 18 %   Lymphs Abs 1.5 0.7 - 4.0 K/uL   Monocytes Relative 12 %   Monocytes Absolute 1.0 0.1 - 1.0 K/uL   Eosinophils Relative 5 %   Eosinophils Absolute 0.4 0.0 - 0.5 K/uL   Basophils Relative 0 %   Basophils Absolute 0.0 0.0 - 0.1 K/uL   Immature Granulocytes 1 %   Abs Immature Granulocytes 0.07 0.00 - 0.07 K/uL    Comment: Performed at [facility], [address] [place], [place] [address]  CG4 I-STAT (Lactic acid)     Status: None   Collection Time: 02/01/18  5:36 AM  Result Value Ref Range   Lactic Acid, Venous 1.21 0.5 - 1.9 mmol/L   Dg Foot Complete Left  Result Date: 02/01/2018 CLINICAL DATA:  LEFT leg pain and swelling.  Leg ulcer neuropathy. EXAM: LEFT FOOT - COMPLETE 3+ VIEW COMPARISON:  None. FINDINGS: No fracture deformity or dislocation. Flexed toe seen with hammertoes deformities. No destructive bony lesions. Small plantar calcaneal spur. Diffuse soft tissue swelling without subcutaneous gas or radiopaque foreign bodies. Old fibular fracture. IMPRESSION: Soft tissue swelling.  No acute osseous process. Electronically Signed   By: Elon Alas M.D.   On: 02/01/2018 03:34    Review of Systems  Constitutional: Negative for chills and fever.  HENT: Negative for sore throat and tinnitus.   Eyes: Negative for blurred vision and redness.  Respiratory: Negative for cough and shortness of breath.   Cardiovascular: Negative for  chest pain, palpitations, orthopnea and PND.  Gastrointestinal: Negative for abdominal pain, diarrhea, nausea and vomiting.  Genitourinary: Negative for dysuria, frequency and urgency.  Musculoskeletal: Negative for joint pain and myalgias.  Skin: Negative for rash.  No lesions  Neurological: Negative for speech change, focal weakness and weakness.  Endo/Heme/Allergies: Does not bruise/bleed easily.       No temperature intolerance  Psychiatric/Behavioral: Negative for depression and suicidal ideas.    Blood pressure (!) 110/46, pulse 69, temperature 99.1 F (37.3 C), temperature source Oral, resp. rate 14, height 5\' 11"  (1.803 m), weight (!) 178.3 kg, SpO2 94 %. Physical Exam  Constitutional: He is oriented to person, place, and time. He appears well-developed and well-nourished. No distress.  HENT:  Head: Normocephalic and atraumatic.  Mouth/Throat: Oropharynx is clear and moist.  Eyes: Pupils are equal, round, and reactive to light. Conjunctivae and EOM are normal. No scleral icterus.  Neck: Normal range of motion. Neck supple. No JVD present. No tracheal deviation present. No thyromegaly present.  Cardiovascular: Exam reveals no gallop and no friction rub.  Murmur heard. Respiratory: Effort normal and breath sounds normal. No respiratory distress. He has no wheezes.  GI: Soft. Bowel sounds are normal. He exhibits no distension. There is no tenderness.  Genitourinary:  Genitourinary Comments: Deferred  Musculoskeletal: Normal range of motion. He exhibits no edema.  Lymphadenopathy:    He has no cervical adenopathy.  Neurological: He is alert and oriented to person, place, and time. No cranial nerve deficit.  Skin: Skin is warm and dry. No rash noted. No erythema.  Psychiatric: He has a normal mood and affect. His behavior is normal. Judgment and thought content normal.     Assessment/Plan This is a 52 year old male admitted for cellulitis. 1.  Cellulitis: Left lower  extremity including wound of the left ankle.  The patient cannot feel it but he can see it and smell it at times.  Continue Vanco and ceftriaxone.  Wound consult placed.  No signs or symptoms of sepsis.   2.  Diabetic ulcer: Left ankle.  The patient is already status post right BKA.  Control blood sugar.  Continue Cymbalta and Lyrica 3.  Diabetes mellitus type 2: The patient takes large doses of prandial insulin.  I have continued this regimen doses adjusted for hospital diet. 4.  CHF: Diastolic; chronic.  Continue torsemide 5.  Hypertension: Controlled; continue Coreg and lisinopril 6.  CKD: Stage III; continue diuresis with Zaroxolyn in addition to loop diuretic. 7.  Obesity: BMI is 54.8; encouraged healthy diet and exercise 8.  DVT prophylaxis: Heparin 9.  GI prophylaxis: None The patient is a full code.  Time spent on admission orders and patient care approximately 45 minutes  Harrie Foreman, MD 02/01/2018, 6:52 AM

## 2018-02-01 NOTE — Progress Notes (Signed)
Pt refused insulin and all meds. States we do not want to give him what he wants. Educated that DM coordinator and MD has discussed management. Refused unna boot and santyl

## 2018-02-01 NOTE — Progress Notes (Addendum)
Inpatient Diabetes Program Recommendations  AACE/ADA: New Consensus Statement on Inpatient Glycemic Control (2019)  Target Ranges:  Prepandial:   less than 140 mg/dL      Peak postprandial:   less than 180 mg/dL (1-2 hours)      Critically ill patients:  140 - 180 mg/dL   Results for Russell [name], Russell [name] (MRN [id number]) as of 02/01/2018 10:35  Ref. Range 02/01/2018 08:36  Glucose-Capillary Latest Ref Range: 70 - 99 mg/dL 170 (H)  Results for Russell [name], Russell [name] (MRN [id number]) as of 02/01/2018 10:35  Ref. Range 02/01/2018 03:13  Glucose Latest Ref Range: 70 - 99 mg/dL 139 (H)   Review of Glycemic Control  Diabetes history: DM2 Outpatient Diabetes medications: Trulicity 1.5 mg Qweek (not taking), Regular 80 units TID with meals Current orders for Inpatient glycemic control: Novolog 48 units TID with meals, Novolog 0-20 units TID with meals  Inpatient Diabetes Program Recommendations:  Correction (SSI): Please consider ordering Novolog 0-5 units QHS for bedtime correction scale.  Insulin - Meal Coverage: Please decrease meal coverage to Novolog 10 units TID with meals if patient eats at least 50% of meals.  Insulin - Basal: Fasting glucose 170 mg/dl today. If glucose is consistently greater than 180 mg/dl, please consider ordering Lantus 18 units Q24H (based on 178 kg x 0.1 units).  HgbA1C: Current A1C in process. Last A1C in Care Everywhere 9.6% on 11/29/17 indicating an average glucose of 229 mg/dl. Strongly recommend patient establish care with an Endocrinologist to improve DM control.  Addendum 02/01/18@11 :00-Spoke with patient about diabetes and home regimen for diabetes control. Patient reports that he is followed by PCP for diabetes management and currently he takes Regular 80 units TID with meals as an outpatient for diabetes control. Inquired about Trulicity noted on home med list and patient states that he is not taking it because it does not work for him.  Patient reports that he is  consistently taking Regular insulin but notes that he may forget to take a shot from time to time. Patient states that he checks his glucose 2-3 times per day and that it is usually in the 200-300's mg/dl but goes up even higher at times.  Inquired about prior A1C and patient reports that he does not recall his last A1C value and he stated "I don't really care. It does not matter anyway". Discussed last A1C results noted in the chart (9.6% on 11/29/17) and explained that A1C indicated an average glucose of 229 mg/dl. Discussed glucose and A1C goals. Discussed importance of checking CBGs and maintaining good CBG control to prevent long-term and short-term complications. Explained how hyperglycemia leads to damage within blood vessels which lead to the common complications seen with uncontrolled diabetes. Stressed to the patient the importance of improving glycemic control to prevent further complications from uncontrolled diabetes. Explained that hyperglycemia is likely contributing to worsening cellulitis and ulcers on left leg/foot and that glycemic control was very important if he did not want to lose his left leg and/or foot. Patient stated that he didn't want to keep his left leg anyway and stated "if I had an axe I would cut it off myself".  Patient reports that he could get around well on right leg prosthesis prior to his left leg getting bad and he feels he could get around better if his left leg were amputated and he could get a prosthesis for that leg as well.  Explained to patient that he likely needs a basal insulin instead of  using such excessive amounts of Regular insulin with meals. Patient states that when he took basal insulin the past it made his glucose go low a lot and he is not open to using any other insulins right now. Patient states that he has asked his PCP about prescribing him Humulin R U500 insulin but his PCP will not prescribe it for him. Explained that Humulin R U500 is a very  concentrated insulin and can be dangerous and is usually prescribed by an Endocrinologist. Encouraged patient to establish care with an Endocrinologist and patient states that he is not going to another doctor and have to pay more money plus he is doing well to get transportation to go to his PCP when he does go.  Provided emotional support throughout the conversation with patient and again stress importance of improving DM control. Patient is not interested in making any changes at this time. Patient verbalized understanding of information discussed and he states that he has no further questions at this time related to diabetes.  Thanks, Barnie Alderman, RN, MSN, CDE Diabetes Coordinator Inpatient Diabetes Program [phone] (Team Pager from 8am to 5pm)

## 2018-02-01 NOTE — ED Provider Notes (Signed)
[facility] Emergency Department Provider Note _________________________  Time seen 2:58 AM I have reviewed the triage vital signs and the nursing notes.   HISTORY  Chief Complaint Leg Pain and Cellulitis    HPI Russell [name] is a 52 y.o. male with below list of chronic medical conditions including CHF hypertension diabetes and previous right below-knee amputation presents to the emergency department with all odorous wound to the dorsal aspect of the left foot with purulent drainage.  Patient states that he has noted increasing redness and swelling to the left foot/leg with purulent drainage.  Patient states that symptoms began following wearing boots on Thanksgiving.  Patient states that subsequent to doing so he noticed a blister in the area where the wound is currently which is since turned into an ulcer.  She admits to subjective fevers at home febrile for EMS.   Past Medical History:  Diagnosis Date  . CHF (congestive heart failure) (HCC)    EF 30-35%  . Depression   . Diabetes mellitus without complication ([place])   . Hyperlipemia   . Hypertension     Patient Active Problem List   Diagnosis Date Noted  . Cellulitis 05/18/2017  . Cellulitis and abscess of left leg 08/08/2016  . Diabetes with ulcer of leg ([place]) 08/08/2016  . Hyperglycemia 08/08/2016  . Morbid obesity ([place]) 08/08/2016  . Cellulitis and abscess of leg 08/08/2016  . Adjustment disorder with depressed mood 11/21/2014  . Other social stressor 11/21/2014  . 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 Diabetes ([place]) 11/21/2014    Past Surgical History:  Procedure Laterality Date  . APPENDECTOMY    . BELOW KNEE LEG AMPUTATION     Right leg  . TONSILLECTOMY      Prior to Admission medications   Medication Sig Start Date End Date Taking? Authorizing Provider  atorvastatin (LIPITOR) 40 MG tablet Take 40 mg by mouth daily.     [provider]  cephALEXin (KEFLEX)  500 MG capsule Take 1 capsule (500 mg total) by mouth every 8 (eight) hours. 05/20/17   Fritzi Mandes, MD  DULoxetine (CYMBALTA) 60 MG capsule Take 60 mg by mouth daily. 11/08/17   [provider]  furosemide (LASIX) 40 MG tablet Take 1 tablet (40 mg total) by mouth daily. 08/12/16   Vaughan Basta, MD  insulin regular (NOVOLIN R,HUMULIN R) 100 units/mL injection Inject 40-100 Units into the skin 3 (three) times daily before meals.    [provider]  lisinopril (PRINIVIL,ZESTRIL) 10 MG tablet Take 10 mg by mouth daily.    [provider]  metolazone (ZAROXOLYN) 10 MG tablet Take 10 mg by mouth daily. 11/09/17   [provider]  potassium chloride SA (K-DUR,KLOR-CON) 20 MEQ tablet Take 20 mEq by mouth 2 (two) times daily. 11/08/17   [provider]  pregabalin (LYRICA) 150 MG capsule Take 150 mg by mouth 3 (three) times daily.     [provider]  traMADol (ULTRAM) 50 MG tablet Take 1 tablet (50 mg total) by mouth every 6 (six) hours as needed. 08/18/17 08/18/18  Laban Emperor, PA-C    Allergies Shellfish allergy  Family History  Problem Relation Age of Onset  . COPD Mother   . Diabetes Father   . CAD Father   . Kidney cancer Brother     Social History Social History   Tobacco Use  . Smoking status: Never Smoker  . Smokeless tobacco: Never Used  Substance Use Topics  .  Alcohol use: No  . Drug use: No    Review of Systems Constitutional: Positive for fever Eyes: No visual changes. ENT: No sore throat. Cardiovascular: Denies chest pain. Respiratory: Denies shortness of breath. Gastrointestinal: No abdominal pain.  No nausea, no vomiting.  No diarrhea.  No constipation. Genitourinary: Negative for dysuria. Musculoskeletal: Negative for neck pain.  Negative for back pain.  Positive for left foot wound Integumentary: Positive for left leg wound Neurological: Negative for headaches, focal weakness or  numbness.  ____________________________________________   PHYSICAL EXAM:  VITAL SIGNS: ED Triage Vitals  Enc Vitals Group     BP      Pulse      Resp      Temp      Temp src      SpO2      Weight      Height      Head Circumference      Peak Flow      Pain Score      Pain Loc      Pain Edu?      Excl. in [place]?     Constitutional: Alert and oriented. Well appearing and in no acute distress. Eyes: Conjunctivae are normal.  Head: Atraumatic. Mouth/Throat: Mucous membranes are moist.  Oropharynx non-erythematous. Neck: No stridor.   Cardiovascular: Normal rate, regular rhythm. Good peripheral circulation. Grossly normal heart sounds. Respiratory: Normal respiratory effort.  No retractions. Lungs CTAB. Gastrointestinal: Soft and nontender. No distention.  Musculoskeletal: No lower extremity tenderness nor edema. No gross deformities of extremities. Neurologic:  Normal speech and language. No gross focal neurologic deficits are appreciated.  Skin: 14 x 12 cm ulcerated malodorous wound dorsal aspect of the left foot with necrotic tissue centrally purulent drainage noted.  Erythema extending from entire foot and up to distal thigh just superior to the knee. Psychiatric: Mood and affect are normal. Speech and behavior are normal.   RADIOLOGY I, [place], personally viewed and evaluated these images (plain radiographs) as part of my medical decision making, as well as reviewing the written report by the radiologist.  ED MD interpretation: Soft tissue swelling noted on the left foot x-ray no acute osseous process per radiologist.  Official radiology report(s): Dg Foot Complete Left  Result Date: 02/01/2018 CLINICAL DATA:  LEFT leg pain and swelling.  Leg ulcer neuropathy. EXAM: LEFT FOOT - COMPLETE 3+ VIEW COMPARISON:  None. FINDINGS: No fracture deformity or dislocation. Flexed toe seen with hammertoes deformities. No destructive bony lesions. Small plantar calcaneal spur.  Diffuse soft tissue swelling without subcutaneous gas or radiopaque foreign bodies. Old fibular fracture. IMPRESSION: Soft tissue swelling.  No acute osseous process. Electronically Signed   By: Elon Alas M.D.   On: 02/01/2018 03:34      Procedures   ____________________________________________   INITIAL IMPRESSION / ASSESSMENT AND PLAN / ED COURSE  As part of my medical decision making, I reviewed the following data within the electronic MEDICAL RECORD [id number] year old male presenting with above-stated history and physical exam secondary to left foot ulcer with associated cellulitis.  Concern for possible osteomyelitis as well and as such x-rays performed.  X-ray revealed no evidence of any osseous process.  Patient given IV vancomycin and ceftriaxone.  Patient discussed with Dr. Marcille Blanco for hospital admission further evaluation and management. ____________________________________________  FINAL CLINICAL IMPRESSION(S) / ED DIAGNOSES  Final diagnoses:  Cellulitis of left foot     MEDICATIONS GIVEN DURING THIS VISIT:  Medications  cefTRIAXone (ROCEPHIN) 2 g  in sodium chloride 0.9 % 100 mL IVPB (0 g Intravenous Stopped 02/01/18 0426)  vancomycin (VANCOCIN) 1,500 mg in sodium chloride 0.9 % 500 mL IVPB (has no administration in time range)  vancomycin (VANCOCIN) IVPB 1000 mg/200 mL premix (0 mg Intravenous Stopped 02/01/18 0503)     ED Discharge Orders    None       Note:  This document was prepared using Dragon voice recognition software and may include unintentional dictation errors.    Gregor Hams, MD 02/01/18 0530

## 2018-02-01 NOTE — Progress Notes (Signed)
[place] nurse returned to the patient's room to place Unna's boot to the LLE, however the patient has some concerns with the care. He reports that "the doctor said that skin is dead" "you aren't gonna debride it".  I had explained this am that my scope only allows me to perform conservative sharp debridement and the necrotic tissue is very adherent.  I explained again the purpose of the enzymatic debridement ointment is to clear the necrotic tissue.  I have reviewed the CM notes and it appears he may not be able to receive follow up for compression wrap changes and wound care as an outpatient or from [facility]. For this reason I will not apply Unna's boots at this time.  I will request the bedside nurse to apply Santyl to the wound bed after the hospitalist assesses the patient and discusses the patients plan of care of with the patient.    Discussed with hospitalist, Dr. Margaretmary Eddy  Updated bedside nurse as well.   Discussed POC with patient and bedside nurse.  Re consult if needed, will not follow at this time. Thanks  Darron Stuck [employer], RN,CWOCN, CNS, [place] [phone])

## 2018-02-01 NOTE — Care Management (Signed)
[facility] will not pay for medical bills but they can assist with utility bills.  [facility] cannot accept patient without a PCP. PCP will not see patient until his bill is paid. RNCM attempting to work this out with patient.

## 2018-02-01 NOTE — ED Notes (Signed)
ED TO INPATIENT HANDOFF REPORT  Name/Age/Gender Russell [name] 52 y.o. male  Code Status Code Status History    Date Active Date Inactive Code Status Order ID Comments User Context   05/18/2017 1958 05/20/2017 2111 Full Code [id number]  Gladstone Lighter, MD Inpatient   08/08/2016 2114 08/12/2016 2151 Full Code [id number]  Idelle Crouch, MD Inpatient      Home/SNF/Other home  Chief Complaint [facility] - Left leg swelling  Level of Care/Admitting Diagnosis ED Disposition    ED Disposition Condition [place]: [place] [[id number]]  Level of Care: Med-Surg [16]  Diagnosis: Cellulitis of left lower extremity [235573]  Admitting Physician: Harrie Foreman [[id number]]  Attending Physician: Harrie Foreman [[id number]]  Estimated length of stay: past midnight tomorrow  Certification:: I certify this patient will need inpatient services for at least 2 midnights  PT Class (Do Not Modify): Inpatient [101]  PT Acc Code (Do Not Modify): Private [1]       Medical History Past Medical History:  Diagnosis Date  . CHF (congestive heart failure) (HCC)    EF 30-35%  . Depression   . Diabetes mellitus without complication ([place])   . Hyperlipemia   . Hypertension     Allergies Allergies  Allergen Reactions  . Shellfish Allergy Hives    IV Location/Drains/Wounds Patient Lines/Drains/Airways Status   Active Line/Drains/Airways    Name:   Placement date:   Placement time:   Site:   Days:   Peripheral IV 05/18/17 Left Forearm   05/18/17    1742    Forearm   259   Peripheral IV 08/18/17 Right Forearm   08/18/17    2040    Forearm   167   Peripheral IV 02/01/18 Left Hand   02/01/18    0308    Hand   less than 1   Peripheral IV 02/01/18 Right Hand   02/01/18    0337    Hand   less than 1   Wound / Incision (Open or Dehisced) 05/18/17 Anterior;Left scabbed   05/18/17    2100    -   259          Labs/Imaging Results for orders placed or  performed during the hospital encounter of 02/01/18 (from the past 48 hour(s))  Comprehensive metabolic panel     Status: Abnormal   Collection Time: 02/01/18  3:13 AM  Result Value Ref Range   Sodium 135 135 - 145 mmol/L   Potassium 4.2 3.5 - 5.1 mmol/L   Chloride 95 (L) 98 - 111 mmol/L   CO2 30 22 - 32 mmol/L   Glucose, Bld 139 (H) 70 - 99 mg/dL   BUN 57 (H) 6 - 20 mg/dL   Creatinine, Ser 1.60 (H) 0.61 - 1.24 mg/dL   Calcium 8.9 8.9 - 10.3 mg/dL   Total Protein 7.6 6.5 - 8.1 g/dL   Albumin 3.6 3.5 - 5.0 g/dL   AST 24 15 - 41 U/L   ALT 16 0 - 44 U/L   Alkaline Phosphatase 107 38 - 126 U/L   Total Bilirubin 0.5 0.3 - 1.2 mg/dL   GFR calc non Af Amer 48 (L) >60 mL/min   GFR calc Af Amer 56 (L) >60 mL/min   Anion gap 10 5 - 15    Comment: Performed at [facility], [address], [place], [place] [address]  Blood Culture (routine x 2)  Status: None (Preliminary result)   Collection Time: 02/01/18  3:13 AM  Result Value Ref Range   Specimen Description BLOOD LEFT HAND    Special Requests      BOTTLES DRAWN AEROBIC AND ANAEROBIC Blood Culture adequate volume   Culture      NO GROWTH < 12 HOURS Performed at [facility], [address] [place], [place] [address]    Report Status PENDING   Blood Culture (routine x 2)     Status: None (Preliminary result)   Collection Time: 02/01/18  3:13 AM  Result Value Ref Range   Specimen Description BLOOD LEFT WRIST    Special Requests      BOTTLES DRAWN AEROBIC AND ANAEROBIC Blood Culture adequate volume   Culture      NO GROWTH < 12 HOURS Performed at [facility], [address] [place], [place] [address]    Report Status PENDING   CBC with Differential/Platelet     Status: Abnormal   Collection Time: 02/01/18  5:30 AM  Result Value Ref Range   WBC 8.4 4.0 - 10.5 K/uL   RBC 4.74 4.22 - 5.81 MIL/uL   Hemoglobin 12.3 (L) 13.0 - 17.0 g/dL   HCT 37.6 (L) 39.0 - 52.0 %   MCV 79.3 (L) 80.0 - 100.0 fL    MCH 25.9 (L) 26.0 - 34.0 pg   MCHC 32.7 30.0 - 36.0 g/dL   RDW 14.5 11.5 - 15.5 %   Platelets 215 150 - 400 K/uL   nRBC 0.0 0.0 - 0.2 %   Neutrophils Relative % 64 %   Neutro Abs 5.4 1.7 - 7.7 K/uL   Lymphocytes Relative 18 %   Lymphs Abs 1.5 0.7 - 4.0 K/uL   Monocytes Relative 12 %   Monocytes Absolute 1.0 0.1 - 1.0 K/uL   Eosinophils Relative 5 %   Eosinophils Absolute 0.4 0.0 - 0.5 K/uL   Basophils Relative 0 %   Basophils Absolute 0.0 0.0 - 0.1 K/uL   Immature Granulocytes 1 %   Abs Immature Granulocytes 0.07 0.00 - 0.07 K/uL    Comment: Performed at [facility], [address] [place], [place] [address]  CG4 I-STAT (Lactic acid)     Status: None   Collection Time: 02/01/18  5:36 AM  Result Value Ref Range   Lactic Acid, Venous 1.21 0.5 - 1.9 mmol/L   Dg Foot Complete Left  Result Date: 02/01/2018 CLINICAL DATA:  LEFT leg pain and swelling.  Leg ulcer neuropathy. EXAM: LEFT FOOT - COMPLETE 3+ VIEW COMPARISON:  None. FINDINGS: No fracture deformity or dislocation. Flexed toe seen with hammertoes deformities. No destructive bony lesions. Small plantar calcaneal spur. Diffuse soft tissue swelling without subcutaneous gas or radiopaque foreign bodies. Old fibular fracture. IMPRESSION: Soft tissue swelling.  No acute osseous process. Electronically Signed   By: Elon Alas M.D.   On: 02/01/2018 03:34    Pending Labs Unresulted Labs (From admission, onward)    Start     Ordered   02/02/18 2100  Vancomycin, trough  Once-Timed,   STAT     02/01/18 0422   02/01/18 0302  CBC WITH DIFFERENTIAL  ONCE - STAT,   STAT     02/01/18 0302   02/01/18 0302  Urinalysis, Routine w reflex microscopic  ONCE - STAT,   STAT     02/01/18 0302   Signed and Held  TSH  Add-on,   R     Signed and Held  Signed and Held  Hemoglobin A1c  Add-on,   R     Signed and Held          Vitals/Pain Today's Vitals   02/01/18 0430 02/01/18 0500 02/01/18 0600 02/01/18 0609  BP: (!)  116/51 (!) 103/37 (!) 110/46   Pulse: 71 71 69   Resp: 13 14 14    Temp:      TempSrc:      SpO2: 95% 93% 94%   Weight:      Height:      PainSc:   0-No pain 0-No pain    Isolation Precautions No active isolations  Medications Medications  cefTRIAXone (ROCEPHIN) 2 g in sodium chloride 0.9 % 100 mL IVPB (0 g Intravenous Stopped 02/01/18 0426)  vancomycin (VANCOCIN) 1,500 mg in sodium chloride 0.9 % 500 mL IVPB (has no administration in time range)  vancomycin (VANCOCIN) IVPB 1000 mg/200 mL premix (0 mg Intravenous Stopped 02/01/18 0503)    Mobility Right leg amputee

## 2018-02-01 NOTE — Consult Note (Signed)
[place] Nurse wound consult note Reason for Consult: LLE wound Patient with longstanding history of venous stasis. 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 The compression is confined to the malleolar region to the patellar notch.  He states the ulcer started as a blister and that's when he placed the sleeve over the other portion of the LLE. I have explained to the patient the necessity of therapeutic compression long term. Patient will need to wear compression stockings from the first metatarsal head to the patellar notch in order to avoid ulcerations. I am not sure he will comply with this. He does not have compression pumps in the home. Suggested he follow up in a wound care center of his choice.  Wound type: venous stasis Pressure Injury POA: NA Measurement: 5cm x 7cm x 0cm  Wound bed:80% eschar/20% dry pink Drainage (amount, consistency, odor) none Periwound: 3+ pitting edema Dressing procedure/placement/frequency: Enzymatic debridement to the flexor area of the left ankle, cover with minimally damp gauze with saline, cover with foam Unna's boot to the LLE Will need to reassess Friday if still inpatient.  Will need HHRN for home health at [place] for wound care and Unna's boots 2x wk due to the presence of the wound.   Requested CM for [facility] needs. Notified hospitalist of need for [place] at [place] via [place].  [place], [place], [place]

## 2018-02-01 NOTE — Progress Notes (Signed)
Pharmacy Antibiotic Note  Russell [name] is a 52 y.o. male admitted on 02/01/2018 with sepsis s/t wound infection.  Pharmacy has been consulted for vanc dosing. Patient received vanc 1g and ceftriaxone 2g IV x 1 in ED  Plan: Will continue vanc 1.5g IV q12h w/ 6 hour stack  Will draw trough 12/05 @ 2100 prior to 4th dose. Patient is currently also receiving ceftriaxone 2g IV q24h  Ke 0.07744 T1/2 ~ 12 hrs Goal trough 15 - 20 mcg/mL  Height: 5\' 11"  (180.3 cm) Weight: (!) 393 lb (178.3 kg) IBW/kg (Calculated) : 75.3  Temp (24hrs), Avg:99.1 F (37.3 C), Min:99.1 F (37.3 C), Max:99.1 F (37.3 C)  Recent Labs  Lab 02/01/18 0313  CREATININE 1.60*    Estimated Creatinine Clearance: 88 mL/min (A) (by C-G formula based on SCr of 1.6 mg/dL (H)).    Allergies  Allergen Reactions  . Shellfish Allergy Hives    Thank you for allowing pharmacy to be a part of this patient's care.  Tobie Lords, PharmD, BCPS Clinical Pharmacist 02/01/2018

## 2018-02-01 NOTE — Progress Notes (Signed)
[place] at [place] NAME: Russell [name]    MR#:  [id number]  DATE OF BIRTH:  May 30, 1965  SUBJECTIVE:  CHIEF COMPLAINT: Patient reports congestion and tenderness in the maxillary area.  Does not have any pain in the leg as he has neuropathy  REVIEW OF SYSTEMS:  CONSTITUTIONAL: No fever, fatigue or weakness.  EYES: No blurred or double vision.  EARS, NOSE, AND THROAT: No tinnitus or ear pain.  RESPIRATORY: No cough, shortness of breath, wheezing or hemoptysis.  CARDIOVASCULAR: No chest pain, orthopnea, edema.  GASTROINTESTINAL: No nausea, vomiting, diarrhea or abdominal pain.  GENITOURINARY: No dysuria, hematuria.  ENDOCRINE: No polyuria, nocturia,  HEMATOLOGY: No anemia, easy bruising or bleeding SKIN: No rash or lesion. MUSCULOSKELETAL: No joint pain or arthritis.   NEUROLOGIC: No tingling, numbness, weakness.  PSYCHIATRY: No anxiety or depression.   DRUG ALLERGIES:   Allergies  Allergen Reactions  . Shellfish Allergy Hives    VITALS:  Blood pressure (!) 96/28, pulse 66, temperature 97.7 F (36.5 C), temperature source Oral, resp. rate 18, height 5\' 11"  (1.803 m), weight (!) 178.3 kg, SpO2 98 %.  PHYSICAL EXAMINATION:  GENERAL:  52 y.o.-year-old patient lying in the bed with no acute distress.  EYES: Pupils equal, round, reactive to light and accommodation. No scleral icterus. Extraocular muscles intact.  HEENT: Head atraumatic, normocephalic. Oropharynx and nasopharynx clear.  Maxillary and frontal sinus tenderness is present NECK:  Supple, no jugular venous distention. No thyroid enlargement, no tenderness.  LUNGS: Normal breath sounds bilaterally, no wheezing, rales,rhonchi or crepitation. No use of accessory muscles of respiration.  CARDIOVASCULAR: S1, S2 normal. No murmurs, rubs, or gallops.  ABDOMEN: Soft, nontender, nondistended. Bowel sounds present.  EXTREMITIES: Right BKA, left lower extremity flexor aspect of the  foot with wound no purulent discharge or fluctuance noticed  no pedal edema, cyanosis, or clubbing.  NEUROLOGIC: Awake, alert and oriented x3. Sensation intact. Gait not checked.  PSYCHIATRIC: The patient is alert and oriented x 3.  SKIN: No obvious rash, lesion, or ulcer.    LABORATORY PANEL:   CBC Recent Labs  Lab 02/01/18 0530  WBC 8.4  HGB 12.3*  HCT 37.6*  PLT 215   ------------------------------------------------------------------------------------------------------------------  Chemistries  Recent Labs  Lab 02/01/18 0313  NA 135  K 4.2  CL 95*  CO2 30  GLUCOSE 139*  BUN 57*  CREATININE 1.60*  CALCIUM 8.9  AST 24  ALT 16  ALKPHOS 107  BILITOT 0.5   ------------------------------------------------------------------------------------------------------------------  Cardiac Enzymes No results for input(s): TROPONINI in the last 168 hours. ------------------------------------------------------------------------------------------------------------------  RADIOLOGY:  Dg Foot Complete Left  Result Date: 02/01/2018 CLINICAL DATA:  LEFT leg pain and swelling.  Leg ulcer neuropathy. EXAM: LEFT FOOT - COMPLETE 3+ VIEW COMPARISON:  None. FINDINGS: No fracture deformity or dislocation. Flexed toe seen with hammertoes deformities. No destructive bony lesions. Small plantar calcaneal spur. Diffuse soft tissue swelling without subcutaneous gas or radiopaque foreign bodies. Old fibular fracture. IMPRESSION: Soft tissue swelling.  No acute osseous process. Electronically Signed   By: Elon Alas M.D.   On: 02/01/2018 03:34    EKG:   Orders placed or performed during the hospital encounter of 02/01/18  . ED EKG 12-Lead  . ED EKG 12-Lead  . EKG 12-Lead  . EKG 12-Lead    ASSESSMENT AND PLAN:    1.  Cellulitis: Left lower extremity including wound of the left ankle.  Patient is refusing wound care needs debridement he  states Podiatry consult placed to Dr. Cleda Mccreedy .   Continue Vanco and ceftriaxone.No signs or symptoms of sepsis.   2.  Diabetic ulcer: Left ankle.  The patient is already status post right BKA.  Control blood sugar.  Continue Cymbalta and Lyrica 3.  Diabetes mellitus type 2: NovoLog 10 units 3 times daily and sliding scale  4.  CHF: Diastolic; chronic.  Continue torsemide 5.  Hypertension: Controlled; continue Coreg and lisinopril 6.  CKD: Stage III; continue diuresis with Zaroxolyn in addition to loop diuretic. 7.  Obesity: BMI is 54.8; encouraged healthy diet and exercise 8.  DVT prophylaxis: Heparin 9.  GI prophylaxis: None    All the records are reviewed and case discussed with Care Management/Social Workerr. Management plans discussed with the patient, family and they are in agreement.  CODE STATUS: fc  TOTAL TIME TAKING CARE OF THIS PATIENT: 34 minutes.   POSSIBLE D/C IN 2  DAYS, DEPENDING ON CLINICAL CONDITION.  Note: This dictation was prepared with Dragon dictation along with smaller phrase technology. Any transcriptional errors that result from this process are unintentional.   Nicholes Mango M.D on 02/01/2018 at 3:44 PM  Between 7am to 6pm - Pager - [phone] After 6pm go to [email or web address] - password [email or web address] [facility]  [facility]  Office  [phone]  CC: Primary care physician; Kirk Ruths, MD

## 2018-02-01 NOTE — Consult Note (Signed)
Reason for Consult: Diabetes with ulceration left foot Referring Physician: [name] is an 52 y.o. male.  HPI: This is a 52 year old male with a chronic history of a sore on his left foot.  States he developed a blister about 5 days ago with some increased drainage.  Does relate a history of amputation of his right lower extremity.  States he currently has been bandaging this and taking care of it himself.  Past Medical History:  Diagnosis Date  . CHF (congestive heart failure) (HCC)    EF 30-35%  . Depression   . Diabetes mellitus without complication ([place])   . Hyperlipemia   . Hypertension     Past Surgical History:  Procedure Laterality Date  . APPENDECTOMY    . BELOW KNEE LEG AMPUTATION     Right leg  . TONSILLECTOMY      Family History  Problem Relation Age of Onset  . COPD Mother   . Diabetes Father   . CAD Father   . Kidney cancer Brother     Social History:  reports that he has never smoked. He has never used smokeless tobacco. He reports that he does not drink alcohol or use drugs.  Allergies:  Allergies  Allergen Reactions  . Shellfish Allergy Hives    Medications:  Scheduled: . atorvastatin  40 mg Oral Daily  . carvedilol  6.25 mg Oral BID WC  . clotrimazole  1 application Topical BID  . collagenase   Topical Daily  . docusate sodium  100 mg Oral BID  . DULoxetine  60 mg Oral Daily  . heparin  5,000 Units Subcutaneous Q8H  . insulin aspart  0-20 Units Subcutaneous TID WC  . insulin aspart  0-5 Units Subcutaneous QHS  . insulin aspart  10 Units Subcutaneous TID WC  . ketotifen  1 drop Both Eyes BID  . lisinopril  10 mg Oral Daily  . metolazone  10 mg Oral Daily  . oxymetazoline  1 spray Each Nare BID  . potassium chloride SA  20 mEq Oral BID  . pregabalin  150 mg Oral TID  . senna-docusate  2 tablet Oral BID  . torsemide  20 mg Oral Daily  . venlafaxine XR  75 mg Oral Q breakfast    Results for orders placed or performed during  the hospital encounter of 02/01/18 (from the past 48 hour(s))  Comprehensive metabolic panel     Status: Abnormal   Collection Time: 02/01/18  3:13 AM  Result Value Ref Range   Sodium 135 135 - 145 mmol/L   Potassium 4.2 3.5 - 5.1 mmol/L   Chloride 95 (L) 98 - 111 mmol/L   CO2 30 22 - 32 mmol/L   Glucose, Bld 139 (H) 70 - 99 mg/dL   BUN 57 (H) 6 - 20 mg/dL   Creatinine, Ser 1.60 (H) 0.61 - 1.24 mg/dL   Calcium 8.9 8.9 - 10.3 mg/dL   Total Protein 7.6 6.5 - 8.1 g/dL   Albumin 3.6 3.5 - 5.0 g/dL   AST 24 15 - 41 U/L   ALT 16 0 - 44 U/L   Alkaline Phosphatase 107 38 - 126 U/L   Total Bilirubin 0.5 0.3 - 1.2 mg/dL   GFR calc non Af Amer 48 (L) >60 mL/min   GFR calc Af Amer 56 (L) >60 mL/min   Anion gap 10 5 - 15    Comment: Performed at [facility], [address] [place], [place]  [address]  Blood Culture (routine x 2)     Status: None (Preliminary result)   Collection Time: 02/01/18  3:13 AM  Result Value Ref Range   Specimen Description BLOOD LEFT HAND    Special Requests      BOTTLES DRAWN AEROBIC AND ANAEROBIC Blood Culture adequate volume   Culture      NO GROWTH < 12 HOURS Performed at [facility], [address], [place], [place] [address]    Report Status PENDING   Blood Culture (routine x 2)     Status: None (Preliminary result)   Collection Time: 02/01/18  3:13 AM  Result Value Ref Range   Specimen Description BLOOD LEFT WRIST    Special Requests      BOTTLES DRAWN AEROBIC AND ANAEROBIC Blood Culture adequate volume   Culture      NO GROWTH < 12 HOURS Performed at [facility], [address] [place], [place] [address]    Report Status PENDING   TSH     Status: None   Collection Time: 02/01/18  3:13 AM  Result Value Ref Range   TSH 4.105 0.350 - 4.500 uIU/mL    Comment: Performed by a 3rd Generation assay with a functional sensitivity of <=0.01 uIU/mL. Performed at [facility], [address] [place], [place]  [address]   Hemoglobin A1c     Status: Abnormal   Collection Time: 02/01/18  3:13 AM  Result Value Ref Range   Hgb A1c MFr Bld 9.8 (H) 4.8 - 5.6 %    Comment: (NOTE) Pre diabetes:          5.7%-6.4% Diabetes:              >6.4% Glycemic control for   <7.0% adults with diabetes    Mean Plasma Glucose 234.56 mg/dL    Comment: Performed at [facility] [address], [place],  [address]  CBC with Differential/Platelet     Status: Abnormal   Collection Time: 02/01/18  5:30 AM  Result Value Ref Range   WBC 8.4 4.0 - 10.5 K/uL   RBC 4.74 4.22 - 5.81 MIL/uL   Hemoglobin 12.3 (L) 13.0 - 17.0 g/dL   HCT 37.6 (L) 39.0 - 52.0 %   MCV 79.3 (L) 80.0 - 100.0 fL   MCH 25.9 (L) 26.0 - 34.0 pg   MCHC 32.7 30.0 - 36.0 g/dL   RDW 14.5 11.5 - 15.5 %   Platelets 215 150 - 400 K/uL   nRBC 0.0 0.0 - 0.2 %   Neutrophils Relative % 64 %   Neutro Abs 5.4 1.7 - 7.7 K/uL   Lymphocytes Relative 18 %   Lymphs Abs 1.5 0.7 - 4.0 K/uL   Monocytes Relative 12 %   Monocytes Absolute 1.0 0.1 - 1.0 K/uL   Eosinophils Relative 5 %   Eosinophils Absolute 0.4 0.0 - 0.5 K/uL   Basophils Relative 0 %   Basophils Absolute 0.0 0.0 - 0.1 K/uL   Immature Granulocytes 1 %   Abs Immature Granulocytes 0.07 0.00 - 0.07 K/uL    Comment: Performed at [facility], [address] [place], [place] [address]  CG4 I-STAT (Lactic acid)     Status: None   Collection Time: 02/01/18  5:36 AM  Result Value Ref Range   Lactic Acid, Venous 1.21 0.5 - 1.9 mmol/L  Glucose, capillary     Status: Abnormal   Collection Time: 02/01/18  8:36 AM  Result Value  Ref Range   Glucose-Capillary 170 (H) 70 - 99 mg/dL   Comment 1 Notify RN   Glucose, capillary     Status: Abnormal   Collection Time: 02/01/18 12:09 PM  Result Value Ref Range   Glucose-Capillary 304 (H) 70 - 99 mg/dL   Comment 1 Notify RN   Glucose, capillary     Status: Abnormal   Collection Time: 02/01/18  5:01 PM  Result Value Ref Range    Glucose-Capillary 379 (H) 70 - 99 mg/dL   Comment 1 Notify RN     Dg Foot Complete Left  Result Date: 02/01/2018 CLINICAL DATA:  LEFT leg pain and swelling.  Leg ulcer neuropathy. EXAM: LEFT FOOT - COMPLETE 3+ VIEW COMPARISON:  None. FINDINGS: No fracture deformity or dislocation. Flexed toe seen with hammertoes deformities. No destructive bony lesions. Small plantar calcaneal spur. Diffuse soft tissue swelling without subcutaneous gas or radiopaque foreign bodies. Old fibular fracture. IMPRESSION: Soft tissue swelling.  No acute osseous process. Electronically Signed   By: Elon Alas M.D.   On: 02/01/2018 03:34    Review of Systems  Constitutional: Negative for chills and fever.  HENT: Negative for hearing loss and tinnitus.   Eyes: Negative for blurred vision and double vision.  Respiratory: Negative for cough and shortness of breath.   Cardiovascular: Negative for chest pain and palpitations.  Gastrointestinal: Negative for nausea and vomiting.  Genitourinary: Negative for dysuria.  Musculoskeletal: Negative for joint pain and myalgias.  Skin:       Patient relates a rash over his body that has just developed since being in the hospital.  Chronic sore on his left foot with increased drainage over the last 5 days.  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  Endo/Heme/Allergies: Negative.   Psychiatric/Behavioral: Negative.    Blood pressure 135/66, pulse 70, temperature 97.7 F (36.5 C), temperature source Oral, resp. rate 18, height 5\' 11"  (1.803 m), weight (!) 178.3 kg, SpO2 96 %. Physical Exam  Cardiovascular:  DP and PT pulses are palpable on the left foot.  Previous amputation of the right lower extremity.  Musculoskeletal:  Previous amputation, BKA right lower extremity.  Neurological:  Complete loss of sensation in the left lower extremity.  Skin:  The skin is warm dry and atrophic with significant  edema and hyperpigmentation of the left foot and lower extremity.  A large full-thickness ulceration approximately 7 cm x 5 cm pre-and post debridement at the anterior aspect of the left foot and ankle.  Some significant necrotic and devitalized tissue noted at the base of the wound down to the deeper subcutaneous and muscular junction.  Depth of approximately 5 mm post debridement.  No focal pockets of purulence are noted.    Assessment/Plan: Assessment: 1.  Full-thickness ulceration left foot. 2.  Diabetes with associated neuropathy.  Plan: Excisionally debrided devitalized tissue from the ulceration on the left foot full-thickness including the deeper subcutaneous and muscular tissue sharply using a pair of tissue nippers.  Santyl ointment and a sterile gauze bandage applied followed by Kerlix and an Ace wrap.  Discussed with the patient that he will need daily dressing changes with the Santyl for further enzymatic debridement.  Discussed that with the previous amputation of his right lower extremity he is at significant risk for amputation of his left lower extremity.  Patient states that he is ready for that at this point.  Follow-up outpatient as  needed.  Durward Fortes 02/01/2018, 7:28 PM

## 2018-02-01 NOTE — ED Notes (Signed)
istat lactic 1.21 Owens Shark, MD and Mallie Mussel, RN made aware.

## 2018-02-01 NOTE — Progress Notes (Signed)
Pharmacy Antibiotic Note  Russell [name] is a 52 y.o. male admitted on 02/01/2018 with sepsis s/t wound infection.  Pharmacy has been consulted for vanc dosing. Patient received vanc 1g and ceftriaxone 2g IV x 1 in ED  Original Plan: Will continue vanc 1.5g IV q12h w/ 6 hour stack  Will draw trough 12/05 @ 2100 prior to 4th dose. Patient is currently also receiving ceftriaxone 2g IV q24h  New plan: Received notification from nurse that 12/05-998 dose was given late (~1600) due to IV access loss.  In light of this delayed dose, will reschedule next pt dose to 0400, and reschedule VT to 12/06 0330   Ke 0.07744 T1/2 ~ 12 hrs Goal trough 15 - 20 mcg/mL  Height: 5\' 11"  (180.3 cm) Weight: (!) 393 lb (178.3 kg) IBW/kg (Calculated) : 75.3  Temp (24hrs), Avg:98.4 F (36.9 C), Min:97.7 F (36.5 C), Max:99.1 F (37.3 C)  Recent Labs  Lab 02/01/18 0313 02/01/18 0530 02/01/18 0536  WBC  --  8.4  --   CREATININE 1.60*  --   --   LATICACIDVEN  --   --  1.21    Estimated Creatinine Clearance: 88 mL/min (A) (by C-G formula based on SCr of 1.6 mg/dL (H)).    Allergies  Allergen Reactions  . Shellfish Allergy Hives    Thank you for allowing pharmacy to be a part of this patient's care.  Lu Duffel, PharmD, BCPS Clinical Pharmacist 02/01/2018 4:05 PM

## 2018-02-02 DIAGNOSIS — L03116 Cellulitis of left lower limb: Secondary | ICD-10-CM | POA: Diagnosis not present

## 2018-02-02 LAB — MRSA PCR SCREENING: MRSA by PCR: POSITIVE — AB

## 2018-02-02 LAB — CREATININE, SERUM
Creatinine, Ser: 1.22 mg/dL (ref 0.61–1.24)
GFR calc non Af Amer: >60 mL/min (ref >60)

## 2018-02-02 LAB — GLUCOSE, CAPILLARY
Glucose-Capillary: 427 mg/dL — ABNORMAL HIGH (ref 70–99)
Glucose-Capillary: 442 mg/dL — ABNORMAL HIGH (ref 70–99)
Glucose-Capillary: 468 mg/dL — ABNORMAL HIGH (ref 70–99)

## 2018-02-02 MED ORDER — INSULIN REGULAR HUMAN 100 UNIT/ML IJ SOLN
15.0000 [IU] | Freq: Once | INTRAMUSCULAR | Status: AC
  Administered 2018-02-02: 15 [IU] via SUBCUTANEOUS
  Filled 2018-02-02: qty 10

## 2018-02-02 MED ORDER — AMOXICILLIN-POT CLAVULANATE 875-125 MG PO TABS: 1.0000 | ORAL_TABLET | Freq: Two times a day (BID) | ORAL | 0 refills | Status: AC

## 2018-02-02 MED ORDER — CIPROFLOXACIN HCL 500 MG PO TABS: 500.0000 mg | ORAL_TABLET | Freq: Two times a day (BID) | ORAL | 0 refills | Status: AC

## 2018-02-02 MED ORDER — INSULIN ASPART 100 UNIT/ML ~~LOC~~ SOLN: 30.0000 [IU] | Freq: Three times a day (TID) | SUBCUTANEOUS | Status: DC

## 2018-02-02 MED ORDER — COLLAGENASE 250 UNIT/GM EX OINT: TOPICAL_OINTMENT | Freq: Every day | CUTANEOUS | 0 refills | Status: DC

## 2018-02-02 MED ORDER — INSULIN ASPART 100 UNIT/ML ~~LOC~~ SOLN
30.0000 [IU] | Freq: Three times a day (TID) | SUBCUTANEOUS | Status: DC
  Administered 2018-02-02: 30 [IU] via SUBCUTANEOUS
  Filled 2018-02-02: qty 1

## 2018-02-02 MED ORDER — INSULIN GLARGINE 100 UNIT/ML ~~LOC~~ SOLN
18.0000 [IU] | Freq: Every day | SUBCUTANEOUS | Status: DC
  Administered 2018-02-02: 18 [IU] via SUBCUTANEOUS
  Filled 2018-02-02 (×3): qty 0.18

## 2018-02-02 NOTE — Progress Notes (Signed)
Called EMS, states that a truck is on the way. Patient updated that EMS is on the way to transport patient.

## 2018-02-02 NOTE — Care Management (Addendum)
RN contacted RNCM and states that patient will need EMS to home.  He is not leaving AMA as he has a discharge order. Patient told RNCM "if I don't get discharged to home I will leave".  EMS packet created. Update:  RNCM provided patient with sliding scale provider list in our area along with resources to help with transportation in the future.

## 2018-02-02 NOTE — Progress Notes (Signed)
Dr. Benjie Karvonen made aware of patient's   Lunch time -most recent blood sugar-427, per MD give 20 units of regular and ok to discharge patient home. Patient refusing to eat lunch, states that he just wants to go home, but did agree to take the Lantus ordered. Patient provided with supplies to dress wound for the next five days and dressing changed prior to departure. EMS called to take patient home.

## 2018-02-02 NOTE — Discharge Summary (Signed)
[name] NAME: Russell [name]    MR#:  [id number]  DATE OF BIRTH:  October 19, 1965  DATE OF ADMISSION:  02/01/2018 ADMITTING PHYSICIAN: Harrie Foreman, MD  DATE OF DISCHARGE: 02/02/2018  PRIMARY CARE PHYSICIAN: Kirk Ruths, MD    ADMISSION DIAGNOSIS:  Cellulitis of left foot [A35.573]  DISCHARGE DIAGNOSIS:  Active Problems:   Cellulitis of left lower extremity   SECONDARY DIAGNOSIS:   Past Medical History:  Diagnosis Date  . CHF (congestive heart failure) (HCC)    EF 30-35%  . Depression   . Diabetes mellitus without complication ([facility])   . Hyperlipemia   . Hypertension     HOSPITAL COURSE:   52 year old male with history of uncontrolled diabetes with A1c greater than 9, morbid obesity, OSA not compliant with CPAP, essential hypertension and chronic systolic heart failure ejection fraction 30% who presented to the emergency room due to left ankle pain and wound.  1.  Full-thickness ulceration of left foot associated with diabetes/cellulitis: Patient was seen and evaluated by podiatry.  Excisional debridement was performed.  Recommendations are for Santyl ointment and sterile gauze bandage applied followed by Kerlix and an Ace wrap daily. He will be discharged on oral ciprofloxacin Augmentin. Patient did have red man syndrome with vancomycin.  2.  Uncontrolled diabetes: A1c is 9.6. Patient was seen and evaluate by diabetes coordinator.  Patient refuses to take any other insulins other than what he is taking at home which is regular insulin 80 3 times daily. He is supposed to take Trulicity as well but does not take this. He was advised to follow-up with primary care physician regarding his uncontrolled diabetes and it was discussed in great detail this morning prior to discharge complications of uncontrolled diabetes.  3.  Essential hypertension: Continue Coreg and lisinopril  4.  Chronic systolic heart failure  without signs exacerbation: Continue torsemide and potassium 5.  Depression: Continue duloxetine and Effexor  Patient has not compliant with medications.  High risk for return back to the hospital. DISCHARGE CONDITIONS AND DIET:   Stable for discharge on diabetic heart healthy diet CONSULTS OBTAINED:  Treatment Team:  Sharlotte Alamo, DPM  DRUG ALLERGIES:   Allergies  Allergen Reactions  . Shellfish Allergy Hives    DISCHARGE MEDICATIONS:   Allergies as of 02/02/2018      Reactions   Shellfish Allergy Hives      Medication List    TAKE these medications   amoxicillin-clavulanate 875-125 MG tablet Commonly known as:  AUGMENTIN Take 1 tablet by mouth 2 (two) times daily for 7 days.   atorvastatin 40 MG tablet Commonly known as:  LIPITOR Take 40 mg by mouth daily.   azelastine 0.05 % ophthalmic solution Commonly known as:  OPTIVAR Place 1 drop into both eyes 2 (two) times daily.   carvedilol 6.25 MG tablet Commonly known as:  COREG Take 6.25 mg by mouth 2 (two) times daily with a meal.   ciprofloxacin 500 MG tablet Commonly known as:  CIPRO Take 1 tablet (500 mg total) by mouth 2 (two) times daily for 7 days.   clotrimazole 1 % cream Commonly known as:  LOTRIMIN Apply 1 application topically 2 (two) times daily.   collagenase ointment Commonly known as:  SANTYL Apply topically daily.   DULoxetine 60 MG capsule Commonly known as:  CYMBALTA Take 60 mg by mouth daily.   insulin regular 100 units/mL injection Commonly known as:  NOVOLIN R,HUMULIN R  Inject 80 Units into the skin 3 (three) times daily before meals.   lisinopril 10 MG tablet Commonly known as:  PRINIVIL,ZESTRIL Take 10 mg by mouth daily.   metolazone 10 MG tablet Commonly known as:  ZAROXOLYN Take 10 mg by mouth daily.   potassium chloride SA 20 MEQ tablet Commonly known as:  K-DUR,KLOR-CON Take 20 mEq by mouth 2 (two) times daily.   pregabalin 150 MG capsule Commonly known as:   LYRICA Take 150 mg by mouth 3 (three) times daily.   senna-docusate 8.6-50 MG tablet Commonly known as:  Senokot-S Take 2 tablets by mouth 2 (two) times daily.   torsemide 20 MG tablet Commonly known as:  DEMADEX Take 20 mg by mouth daily.   TRULICITY 1.5 CZ/6.6AY Sopn Generic drug:  Dulaglutide Inject 1.5 mg into the skin once a week.   venlafaxine XR 75 MG 24 hr capsule Commonly known as:  EFFEXOR-XR Take 75 mg by mouth daily with breakfast.            Discharge Care Instructions  (From admission, onward)         Start     Ordered   02/02/18 0000  Discharge wound care:    Comments:  Santyl ointment and a sterile gauze bandage applied followed by Kerlix and an Ace wrap.  Discussed with the patient that he will need daily dressing changes with the Santyl for further enzymatic debridement   02/02/18 0830            Today   CHIEF COMPLAINT:   Patient had "red man" syndrome on vancomycin   VITAL SIGNS:  Blood pressure 121/63, pulse 76, temperature 97.6 F (36.4 C), resp. rate 16, height 5\' 11"  (1.803 m), weight (!) 178.3 kg, SpO2 97 %.   REVIEW OF SYSTEMS:  Review of Systems  Constitutional: Negative.  Negative for chills, fever and malaise/fatigue.  HENT: Negative.  Negative for ear discharge, ear pain, hearing loss, nosebleeds and sore throat.   Eyes: Negative.  Negative for blurred vision and pain.  Respiratory: Negative.  Negative for cough, hemoptysis, shortness of breath and wheezing.   Cardiovascular: Positive for leg swelling. Negative for chest pain and palpitations.  Gastrointestinal: Negative.  Negative for abdominal pain, blood in stool, diarrhea, nausea and vomiting.  Genitourinary: Negative.  Negative for dysuria.  Musculoskeletal: Negative.  Negative for back pain.  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  Endo/Heme/Allergies:  Negative.  Does not bruise/bleed easily.  Psychiatric/Behavioral: Negative.  Negative for depression, hallucinations and suicidal ideas.     PHYSICAL EXAMINATION:  GENERAL:  52 y.o.-year-old patient lying in the bed with no acute distress.  Morbid obesity NECK:  Supple, no jugular venous distention. No thyroid enlargement, no tenderness.  LUNGS: Normal breath sounds bilaterally, no wheezing, rales,rhonchi  No use of accessory muscles of respiration.  CARDIOVASCULAR: S1, S2 normal. No murmurs, rubs, or gallops.  ABDOMEN: Soft, non-tender, non-distended. Bowel sounds present. No organomegaly or mass.  EXTREMITIES: BKA right lower extremity Full-thickness ulceration 7 cm x 5 cm left foot and ankle PSYCHIATRIC: The patient is alert and oriented x 3.  SKIN: Red man syndrome.   DATA REVIEW:   CBC Recent Labs  Lab 02/01/18 0530  WBC 8.4  HGB 12.3*  HCT 37.6*  PLT 215    Chemistries  Recent Labs  Lab 02/01/18 0313 02/02/18 0306  NA  135  --   K 4.2  --   CL 95*  --   CO2 30  --   GLUCOSE 139*  --   BUN 57*  --   CREATININE 1.60* 1.22  CALCIUM 8.9  --   AST 24  --   ALT 16  --   ALKPHOS 107  --   BILITOT 0.5  --     Cardiac Enzymes No results for input(s): TROPONINI in the last 168 hours.  Microbiology Results  @MICRORSLT48 @  RADIOLOGY:  Dg Foot Complete Left  Result Date: 02/01/2018 CLINICAL DATA:  LEFT leg pain and swelling.  Leg ulcer neuropathy. EXAM: LEFT FOOT - COMPLETE 3+ VIEW COMPARISON:  None. FINDINGS: No fracture deformity or dislocation. Flexed toe seen with hammertoes deformities. No destructive bony lesions. Small plantar calcaneal spur. Diffuse soft tissue swelling without subcutaneous gas or radiopaque foreign bodies. Old fibular fracture. IMPRESSION: Soft tissue swelling.  No acute osseous process. Electronically Signed   By: Elon Alas M.D.   On: 02/01/2018 03:34      Allergies as of 02/02/2018      Reactions   Shellfish Allergy Hives       Medication List    TAKE these medications   amoxicillin-clavulanate 875-125 MG tablet Commonly known as:  AUGMENTIN Take 1 tablet by mouth 2 (two) times daily for 7 days.   atorvastatin 40 MG tablet Commonly known as:  LIPITOR Take 40 mg by mouth daily.   azelastine 0.05 % ophthalmic solution Commonly known as:  OPTIVAR Place 1 drop into both eyes 2 (two) times daily.   carvedilol 6.25 MG tablet Commonly known as:  COREG Take 6.25 mg by mouth 2 (two) times daily with a meal.   ciprofloxacin 500 MG tablet Commonly known as:  CIPRO Take 1 tablet (500 mg total) by mouth 2 (two) times daily for 7 days.   clotrimazole 1 % cream Commonly known as:  LOTRIMIN Apply 1 application topically 2 (two) times daily.   collagenase ointment Commonly known as:  SANTYL Apply topically daily.   DULoxetine 60 MG capsule Commonly known as:  CYMBALTA Take 60 mg by mouth daily.   insulin regular 100 units/mL injection Commonly known as:  NOVOLIN R,HUMULIN R Inject 80 Units into the skin 3 (three) times daily before meals.   lisinopril 10 MG tablet Commonly known as:  PRINIVIL,ZESTRIL Take 10 mg by mouth daily.   metolazone 10 MG tablet Commonly known as:  ZAROXOLYN Take 10 mg by mouth daily.   potassium chloride SA 20 MEQ tablet Commonly known as:  K-DUR,KLOR-CON Take 20 mEq by mouth 2 (two) times daily.   pregabalin 150 MG capsule Commonly known as:  LYRICA Take 150 mg by mouth 3 (three) times daily.   senna-docusate 8.6-50 MG tablet Commonly known as:  Senokot-S Take 2 tablets by mouth 2 (two) times daily.   torsemide 20 MG tablet Commonly known as:  DEMADEX Take 20 mg by mouth daily.   TRULICITY 1.5 LN/9.8XQ Sopn Generic drug:  Dulaglutide Inject 1.5 mg into the skin once a week.   venlafaxine XR 75 MG 24 hr capsule Commonly known as:  EFFEXOR-XR Take 75 mg by mouth daily with breakfast.            Discharge Care Instructions  (From admission, onward)          Start     Ordered   02/02/18 0000  Discharge wound care:    Comments:  Santyl ointment and  a sterile gauze bandage applied followed by Kerlix and an Ace wrap.  Discussed with the patient that he will need daily dressing changes with the Santyl for further enzymatic debridement   02/02/18 0830             Management plans discussed with the patient and he is in agreement. Stable for discharge home with [facility]  Patient should follow up with pcp  CODE STATUS:     Code Status Orders  (From admission, onward)         Start     Ordered   02/01/18 0828  Full code  Continuous     02/01/18 0827        Code Status History    Date Active Date Inactive Code Status Order ID Comments User Context   05/18/2017 1958 05/20/2017 2111 Full Code [id number]  Gladstone Lighter, MD Inpatient   08/08/2016 2114 08/12/2016 2151 Full Code [id number]  Idelle Crouch, MD Inpatient      TOTAL TIME TAKING CARE OF THIS PATIENT: 38 minutes.    Note: This dictation was prepared with Dragon dictation along with smaller phrase technology. Any transcriptional errors that result from this process are unintentional.  Brynnleigh Mcelwee M.D on 02/02/2018 at 8:45 AM  Between 7am to 6pm - Pager - [phone] After 6pm go to [email or web address] - password [email or web address]  Office  [phone]  CC: Primary care physician; Kirk Ruths, MD

## 2018-02-02 NOTE — Progress Notes (Signed)
Dr. Benjie Karvonen made aware of patient's elevated blood sugar. Patient requesting to be discharged. Insulin orders modified. Per MD, given 20 units of Sliding Scale and additional 30 units of meal coverage as ordered. Patient is no acute distress, willing to take insulin and is ordering breakfast.

## 2018-02-02 NOTE — Care Management (Signed)
RNCM notified by RN that patient told her that he could not pay for his medications. I have reached out to his emergency contact Ms. Tomasita Crumble and she states that "they picked up his diabetic medications ~ a month ago at [facility] on [address] [facility] and she is certain he still has those medications. She agrees to picking up his antibiotics at [facility] on [address]. She states she also offered to take him home but patient told her that EMS was taking him."

## 2018-02-02 NOTE — Care Management (Signed)
RNCM spoke again with patient. [facility] can help him with grocery card and/or gas card. They may also be able to help with a credit on his utilities so that he can pay [place]- patient agrees with this plan.  I still cannot arrange home health for this patient since he does not have a PCP.

## 2018-02-02 NOTE — Progress Notes (Addendum)
Md Ringwood paged to make aware that pt has a rash all over his body, per MD pt is having Red Man Syndrome. Requested IV benadryl, IV benadryl 25 mg was given. Per MD's order I will run Vancomycin at a lower rate, I will consult pharmacy. Pt has a lot of complaints regarding his care and wants to talk to the director and day time supervisor. Will continue to monitor.

## 2018-02-02 NOTE — Progress Notes (Signed)
Patient left with belongings via EMS stretcher

## 2018-02-02 NOTE — Care Management (Addendum)
RNCM spoke with patient this morning and tried to talk him into accepting a home health agency however he would need to change providers due to monies owed to [facility] (see previous CM notes).  RNCM suggested a sliding scale clinic (PCP needed for home health).  Patient declined changing doctors and he declined home health.  He said he "wants to leave and go home. I will be alright. Just give me the antibiotic".  I shared that I was concerned about this poor decision he was making. He said, "I will be alright".  Patient has declined RNCM assistance. RNCM asked RN to provide patient with dressing- she was at patient's bedside and he agreed.

## 2018-02-06 LAB — CULTURE, BLOOD (ROUTINE X 2)
Culture: NO GROWTH
Culture: NO GROWTH
SPECIAL REQUESTS: ADEQUATE
Special Requests: ADEQUATE

## 2018-02-09 ENCOUNTER — Ambulatory Visit: Payer: Medicare Other | Admitting: Physician Assistant

## 2018-06-01 ENCOUNTER — Encounter: Payer: Self-pay | Admitting: Emergency Medicine

## 2018-06-01 ENCOUNTER — Inpatient Hospital Stay
Admission: EM | Admit: 2018-06-01 | Discharge: 2018-06-03 | DRG: 603 | Disposition: A | Discharge status: Home Health | Payer: Medicare Other | Attending: Specialist | Admitting: Specialist

## 2018-06-01 ENCOUNTER — Other Ambulatory Visit: Payer: Self-pay

## 2018-06-01 DIAGNOSIS — I5042 Chronic combined systolic (congestive) and diastolic (congestive) heart failure: Secondary | ICD-10-CM | POA: Diagnosis present

## 2018-06-01 DIAGNOSIS — E11622 Type 2 diabetes mellitus with other skin ulcer: Secondary | ICD-10-CM

## 2018-06-01 DIAGNOSIS — I110 Hypertensive heart disease with heart failure: Secondary | ICD-10-CM | POA: Diagnosis present

## 2018-06-01 DIAGNOSIS — K921 Melena: Secondary | ICD-10-CM | POA: Diagnosis not present

## 2018-06-01 DIAGNOSIS — E785 Hyperlipidemia, unspecified: Secondary | ICD-10-CM | POA: Diagnosis present

## 2018-06-01 DIAGNOSIS — E1142 Type 2 diabetes mellitus with diabetic polyneuropathy: Secondary | ICD-10-CM | POA: Diagnosis present

## 2018-06-01 DIAGNOSIS — L03116 Cellulitis of left lower limb: Principal | ICD-10-CM | POA: Diagnosis present

## 2018-06-01 DIAGNOSIS — Z91013 Allergy to seafood: Secondary | ICD-10-CM

## 2018-06-01 DIAGNOSIS — K625 Hemorrhage of anus and rectum: Secondary | ICD-10-CM

## 2018-06-01 DIAGNOSIS — Z833 Family history of diabetes mellitus: Secondary | ICD-10-CM

## 2018-06-01 DIAGNOSIS — Z89511 Acquired absence of right leg below knee: Secondary | ICD-10-CM

## 2018-06-01 DIAGNOSIS — Z794 Long term (current) use of insulin: Secondary | ICD-10-CM

## 2018-06-01 DIAGNOSIS — F329 Major depressive disorder, single episode, unspecified: Secondary | ICD-10-CM | POA: Diagnosis present

## 2018-06-01 DIAGNOSIS — Z6841 Body Mass Index (BMI) 40.0 and over, adult: Secondary | ICD-10-CM

## 2018-06-01 DIAGNOSIS — Z22322 Carrier or suspected carrier of Methicillin resistant Staphylococcus aureus: Secondary | ICD-10-CM

## 2018-06-01 DIAGNOSIS — Z79899 Other long term (current) drug therapy: Secondary | ICD-10-CM

## 2018-06-01 DIAGNOSIS — L97909 Non-pressure chronic ulcer of unspecified part of unspecified lower leg with unspecified severity: Secondary | ICD-10-CM

## 2018-06-01 DIAGNOSIS — L0390 Cellulitis, unspecified: Secondary | ICD-10-CM | POA: Diagnosis present

## 2018-06-01 DIAGNOSIS — Z8249 Family history of ischemic heart disease and other diseases of the circulatory system: Secondary | ICD-10-CM

## 2018-06-01 DIAGNOSIS — Z825 Family history of asthma and other chronic lower respiratory diseases: Secondary | ICD-10-CM

## 2018-06-01 NOTE — ED Provider Notes (Signed)
[facility] Emergency Department Provider Note  ____________________________________________   First MD Initiated Contact with Patient 06/01/18 2340     (approximate)  I have reviewed the triage vital signs and the nursing notes.   HISTORY  Chief Complaint Rectal Bleeding    HPI Russell [name] is a 53 y.o. 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  He presents by EMS for evaluation of rectal bleeding.  He describes it as acute in onset and severe and describes it as dark red blood and clots that first started when he was trying to have a bowel movement and then continued even when he was walking.  He said that clots of blood were falling out of his rectum.  He says that his abdomen feels tight and hard and sometimes he has some generalized abdominal pain.  Nothing in particular makes his symptoms better or worse.  He denies fever/chills, chest pain, shortness of breath except with exertion which is normal for him, nausea, vomiting.  He has had some GI bleeding in the past that he says is due to hemorrhoids but nothing like this.  He also reports that he has a chronic wound on his left foot but it is been getting worse over time.  He has not seen a doctor for it and does not see wound care.   He said that his left foot has gotten a lot more swollen over the last couple of days but he has a hard time seeing it.  He knows that it is red and it is more swollen but he has no feeling due to the neuropathy.  He does not have any supplies so he has been wrapping it and in a diaper which has been bleeding through and he said that some of the skin on top of his foot "sloughed off".         Past Medical History:  Diagnosis Date  . CHF (congestive heart failure)  (HCC)    EF 30-35%  . Depression   . Diabetes mellitus without complication ([place])   . Hyperlipemia   . Hypertension     Patient Active Problem List   Diagnosis Date Noted  . Cellulitis of left lower extremity 02/01/2018  . Cellulitis 05/18/2017  . Cellulitis and abscess of left leg 08/08/2016  . Diabetes with ulcer of leg ([place]) 08/08/2016  . Hyperglycemia 08/08/2016  . Morbid obesity ([place]) 08/08/2016  . Cellulitis and abscess of leg 08/08/2016  . Adjustment disorder with depressed mood 11/21/2014  . Other social stressor 11/21/2014  . 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 Diabetes ([place]) 11/21/2014    Past Surgical History:  Procedure Laterality Date  . APPENDECTOMY    . BELOW KNEE LEG AMPUTATION     Right leg  . TONSILLECTOMY      Prior to Admission medications   Medication Sig Start Date End Date Taking? Authorizing Provider  atorvastatin (LIPITOR) 40 MG tablet Take 40 mg by mouth daily.     [provider]  azelastine (OPTIVAR) 0.05 % ophthalmic solution Place 1 drop into both eyes 2 (two) times daily.    [provider]  carvedilol (COREG) 6.25 MG tablet Take 6.25 mg by mouth 2 (two) times daily with a meal.    [provider]  clotrimazole (LOTRIMIN) 1 %  cream Apply 1 application topically 2 (two) times daily.    [provider]  collagenase (SANTYL) ointment Apply topically daily. 02/02/18   Bettey Costa, MD  Dulaglutide (TRULICITY) 1.5 SF/6.8LE SOPN Inject 1.5 mg into the skin once a week.    [provider]  DULoxetine (CYMBALTA) 60 MG capsule Take 60 mg by mouth daily. 11/08/17   [provider]  insulin regular (NOVOLIN R,HUMULIN R) 100 units/mL injection Inject 80 Units into the skin 3 (three) times daily before meals.     [provider]  lisinopril (PRINIVIL,ZESTRIL) 10 MG tablet Take 10 mg by mouth daily.    [provider]  metolazone (ZAROXOLYN) 10 MG tablet Take 10 mg by mouth  daily. 11/09/17   [provider]  potassium chloride SA (K-DUR,KLOR-CON) 20 MEQ tablet Take 20 mEq by mouth 2 (two) times daily. 11/08/17   [provider]  pregabalin (LYRICA) 150 MG capsule Take 150 mg by mouth 3 (three) times daily.     [provider]  senna-docusate (SENOKOT-S) 8.6-50 MG tablet Take 2 tablets by mouth 2 (two) times daily.    [provider]  torsemide (DEMADEX) 20 MG tablet Take 20 mg by mouth daily.    [provider]  venlafaxine XR (EFFEXOR-XR) 75 MG 24 hr capsule Take 75 mg by mouth daily with breakfast.    [provider]    Allergies Shellfish allergy  Family History  Problem Relation Age of Onset  . COPD Mother   . Diabetes Father   . CAD Father   . Kidney cancer Brother     Social History Social History   Tobacco Use  . Smoking status: Never Smoker  . Smokeless tobacco: Never Used  Substance Use Topics  . Alcohol use: No  . Drug use: No    Review of Systems Constitutional: No fever/chills Eyes: No visual changes. ENT: No sore throat. Cardiovascular: Denies chest pain. Respiratory: Denies shortness of breath. Gastrointestinal: Rectal bleeding with some generalized abdominal pain. Genitourinary: Negative for dysuria. Musculoskeletal: Negative for neck pain.  Negative for back pain. Integumentary: Negative for rash. Neurological: Negative for headaches, focal weakness or numbness.   ____________________________________________   PHYSICAL EXAM:  VITAL SIGNS: ED Triage Vitals  Enc Vitals Group     BP 06/01/18 2345 (!) 123/46     Pulse Rate 06/01/18 2345 79     Resp 06/01/18 2345 20     Temp 06/01/18 2345 98.4 F (36.9 C)     Temp Source 06/01/18 2345 Oral     SpO2 06/01/18 2345 96 %     Weight --      Height --      Head Circumference --      Peak Flow --      Pain Score 06/01/18 2342 7     Pain Loc --      Pain Edu? --      Excl. in [place]? --     Constitutional: Alert and  oriented.  No acute distress.  Multiple chronic medical conditions are clear upon initial assessment. Eyes: Conjunctivae are normal.  Head: Atraumatic. Nose: No congestion/rhinnorhea. Mouth/Throat: Mucous membranes are moist. Neck: No stridor.  No meningeal signs.   Cardiovascular: Normal rate, regular rhythm.  Respiratory: Normal respiratory effort.  No retractions.  No wheezing is audible Gastrointestinal: Super morbid obesity.  Soft with some diffuse tenderness throughout the abdomen but no specific point tenderness and no guarding or rebound. Rectal: [place] or [employer]  dried blood and stool is present all around the patient's anus.  Rectal exam reveals melena, Hemoccult is positive, quality control passed.  ED chaperone present throughout exam. Musculoskeletal: The patient is status post BKA on the right leg.  His left lower extremity appears to have some chronic lymphedema but additionally he seems to have more acute and purulent cellulitis with a large area of skin desquamation on top of the left foot.  The left foot is edematous and erythematous and the erythema extends up almost to the knee.  He has no sensation due to his diabetic neuropathy but there is some purulence and it appears acutely infected in the setting of chronic lymphedema and probably some degree of chronic cellulitis Neurologic:  Normal speech and language. No gross focal neurologic deficits are appreciated.  Skin:  Skin is warm and dry.  See musculoskeletal section for details of the left lower extremity.   ____________________________________________   LABS (all labs ordered are listed, but only abnormal results are displayed)  Labs Reviewed  COMPREHENSIVE METABOLIC PANEL - Abnormal; Notable for the following components:      Result Value   Chloride 97 (*)    Glucose, Bld 264 (*)    BUN 37 (*)    Creatinine, Ser 1.31 (*)    Calcium 8.4 (*)    All other components within normal limits  CBC WITH  DIFFERENTIAL/PLATELET - Abnormal; Notable for the following components:   Hemoglobin 12.7 (*)    All other components within normal limits  CULTURE, BLOOD (ROUTINE X 2)  CULTURE, BLOOD (ROUTINE X 2)  URINE CULTURE  AEROBIC/ANAEROBIC CULTURE (SURGICAL/DEEP WOUND)  LACTIC ACID, PLASMA  LIPASE, BLOOD  PROCALCITONIN  PROTIME-INR  LACTIC ACID, PLASMA  URINALYSIS, COMPLETE (UACMP) WITH MICROSCOPIC  TYPE AND SCREEN   ____________________________________________  EKG  ED ECG REPORT I, Hinda Kehr, the attending physician, personally viewed and interpreted this ECG.  Date: 06/01/2018 EKG Time: 23: 54 Rate: 75 Rhythm: normal sinus rhythm QRS Axis: normal Intervals: Nonspecific intraventricular conduction delay ST/T Wave abnormalities: normal Narrative Interpretation: no evidence of acute ischemia  ____________________________________________  RADIOLOGY I, Hinda Kehr, personally viewed and evaluated these images (plain radiographs) as part of my medical decision making, as well as reviewing the written report by the radiologist.  ED MD interpretation:  No acute abnormalities on CXR  Official radiology report(s): Dg Chest Port 1 View  Result Date: 06/02/2018 CLINICAL DATA:  Rectal bleeding, abdominal pain EXAM: PORTABLE CHEST 1 VIEW COMPARISON:  05/18/2017 FINDINGS: Lungs are essentially clear. Mild left basilar scarring, chronic. No pleural effusion or pneumothorax. The heart is top-normal in size. IMPRESSION: No evidence of acute cardiopulmonary disease. Electronically Signed   By: Julian Hy M.D.   On: 06/02/2018 00:20    ____________________________________________   PROCEDURES   Procedure(s) performed (including Critical Care):  Procedures   ____________________________________________   INITIAL IMPRESSION / MDM / [place] / ED COURSE  As part of my medical decision making, I reviewed the following data within the [place] notes reviewed and incorporated, Labs reviewed , EKG interpreted , Old chart reviewed, Radiograph reviewed , Discussed with admitting physician (Dr. Jannifer Franklin) and Notes from prior ED visits  Russell [name] was evaluated in Emergency Department on 06/02/2018 for the symptoms described in the history of present illness. He was evaluated in the context of the global COVID-19 pandemic, which necessitated consideration that the patient might be at risk for infection with the SARS-CoV-2 virus that  causes COVID-19. Institutional protocols and algorithms that pertain to the evaluation of patients at risk for COVID-19 are in a state of rapid change based on information released by regulatory bodies including the CDC and federal and state organizations. These policies and algorithms were followed during the patient's care in the ED.      Differential diagnosis includes, but is not limited to, diverticular bleeding, AV malformation, ischemic colitis, acute infection, hemorrhoids.  Differential for the left lower extremity includes acute on chronic cellulitis and purulent wound, likely polymicrobial.  He does not meet sepsis criteria based on his vital signs, but I am performing a broad work-up including procalcitonin, lactic acid, standard lab work, blood cultures.  I will send a wound culture of the foot and start empiric treatment with ceftriaxone 2 g IV and clindamycin 600 mg IV (the patient reports an allergy to vancomycin).  I have verified this regimen with the pharmacist on call.  I will hold off on fluids because he has CHF and he is not hypotensive nor tachycardic and I do not believe he will be severe sepsis.  In addition to the acute on chronic infection in his left lower extremity, he has acute melena and I am checking a type and screen and regulation studies.  I will reassess after the results of his lab test but I am not sure he would benefit from imaging at this time and will likely simply need  admission and GI consult to determine the best way to proceed with melena work-up.  His abdominal exam was relatively reassuring with only some diffuse tenderness although the exam is limited by his size.  He understands and agrees with the current plan.  Clinical Course as of Jun 01 198  Fri Jun 02, 2018  0027 No evidence of acute infection nor pulmonary edema.  DG Chest Port 1 View [CF]  0106 Labs generally reassuring.  No sign of sepsis/severe sepsis.  No leukocytosis, no anemia.    Discussed case in person with Dr. Jannifer Franklin who will admit or inform his colleague, Dr. Marcille Blanco, of the admission.  Agrees with plan for no imaging at this time.   [CF]  0108 Procalcitonin: <0.10 [CF]  0108 Lipase: 20 [CF]  0108 Lactic Acid, Venous: 1.8 [CF]    Clinical Course User Index [CF] Hinda Kehr, MD    ____________________________________________  FINAL CLINICAL IMPRESSION(S) / ED DIAGNOSES  Final diagnoses:  Rectal bleeding  Melena  Cellulitis of left leg  Cellulitis of left foot     MEDICATIONS GIVEN DURING THIS VISIT:  Medications  clindamycin (CLEOCIN) IVPB 600 mg (0 mg Intravenous Stopped 06/02/18 0109)  cefTRIAXone (ROCEPHIN) 2 g in sodium chloride 0.9 % 100 mL IVPB (2 g Intravenous New Bag/Given 06/02/18 0109)     ED Discharge Orders    None       Note:  This document was prepared using Dragon voice recognition software and may include unintentional dictation errors.   Hinda Kehr, MD 06/02/18 0200

## 2018-06-01 NOTE — ED Triage Notes (Signed)
Pt from home with c/o rectal bleeding that started this evening. PT states he noticed clots and c/o abd pain. MD at bedside

## 2018-06-02 ENCOUNTER — Inpatient Hospital Stay: Payer: Medicare Other

## 2018-06-02 ENCOUNTER — Other Ambulatory Visit: Payer: Self-pay

## 2018-06-02 ENCOUNTER — Emergency Department: Payer: Medicare Other

## 2018-06-02 DIAGNOSIS — Z91013 Allergy to seafood: Secondary | ICD-10-CM | POA: Diagnosis not present

## 2018-06-02 DIAGNOSIS — Z79899 Other long term (current) drug therapy: Secondary | ICD-10-CM | POA: Diagnosis not present

## 2018-06-02 DIAGNOSIS — E785 Hyperlipidemia, unspecified: Secondary | ICD-10-CM | POA: Diagnosis present

## 2018-06-02 DIAGNOSIS — Z8249 Family history of ischemic heart disease and other diseases of the circulatory system: Secondary | ICD-10-CM | POA: Diagnosis not present

## 2018-06-02 DIAGNOSIS — K921 Melena: Secondary | ICD-10-CM | POA: Diagnosis present

## 2018-06-02 DIAGNOSIS — Z833 Family history of diabetes mellitus: Secondary | ICD-10-CM | POA: Diagnosis not present

## 2018-06-02 DIAGNOSIS — Z6841 Body Mass Index (BMI) 40.0 and over, adult: Secondary | ICD-10-CM | POA: Diagnosis not present

## 2018-06-02 DIAGNOSIS — E1142 Type 2 diabetes mellitus with diabetic polyneuropathy: Secondary | ICD-10-CM | POA: Diagnosis present

## 2018-06-02 DIAGNOSIS — Z825 Family history of asthma and other chronic lower respiratory diseases: Secondary | ICD-10-CM | POA: Diagnosis not present

## 2018-06-02 DIAGNOSIS — I5042 Chronic combined systolic (congestive) and diastolic (congestive) heart failure: Secondary | ICD-10-CM | POA: Diagnosis present

## 2018-06-02 DIAGNOSIS — I110 Hypertensive heart disease with heart failure: Secondary | ICD-10-CM | POA: Diagnosis present

## 2018-06-02 DIAGNOSIS — Z794 Long term (current) use of insulin: Secondary | ICD-10-CM | POA: Diagnosis not present

## 2018-06-02 DIAGNOSIS — Z22322 Carrier or suspected carrier of Methicillin resistant Staphylococcus aureus: Secondary | ICD-10-CM | POA: Diagnosis not present

## 2018-06-02 DIAGNOSIS — Z89511 Acquired absence of right leg below knee: Secondary | ICD-10-CM | POA: Diagnosis not present

## 2018-06-02 DIAGNOSIS — F329 Major depressive disorder, single episode, unspecified: Secondary | ICD-10-CM | POA: Diagnosis present

## 2018-06-02 DIAGNOSIS — L03116 Cellulitis of left lower limb: Secondary | ICD-10-CM | POA: Diagnosis present

## 2018-06-02 LAB — CBC WITH DIFFERENTIAL/PLATELET
Abs Immature Granulocytes: 0.06 10*3/uL (ref 0.00–0.07)
Basophils Absolute: 0 10*3/uL (ref 0.0–0.1)
Basophils Relative: 0 %
Eosinophils Absolute: 0.2 10*3/uL (ref 0.0–0.5)
Eosinophils Relative: 3 %
HCT: 39.7 % (ref 39.0–52.0)
Hemoglobin: 12.7 g/dL — ABNORMAL LOW (ref 13.0–17.0)
Immature Granulocytes: 1 %
Lymphocytes Relative: 16 %
Lymphs Abs: 1.4 10*3/uL (ref 0.7–4.0)
MCH: 26 pg (ref 26.0–34.0)
MCHC: 32 g/dL (ref 30.0–36.0)
MCV: 81.2 fL (ref 80.0–100.0)
Monocytes Absolute: 0.6 10*3/uL (ref 0.1–1.0)
Monocytes Relative: 7 %
Neutro Abs: 6.5 10*3/uL (ref 1.7–7.7)
Neutrophils Relative %: 73 %
Platelets: 229 10*3/uL (ref 150–400)
RBC: 4.89 MIL/uL (ref 4.22–5.81)
RDW: 14.5 % (ref 11.5–15.5)
WBC: 8.8 10*3/uL (ref 4.0–10.5)
nRBC: 0 % (ref 0.0–0.2)

## 2018-06-02 LAB — GLUCOSE, CAPILLARY
Glucose-Capillary: 250 mg/dL — ABNORMAL HIGH (ref 70–99)
Glucose-Capillary: 198 mg/dL — ABNORMAL HIGH (ref 70–99)
Glucose-Capillary: 189 mg/dL — ABNORMAL HIGH (ref 70–99)
Glucose-Capillary: 320 mg/dL — ABNORMAL HIGH (ref 70–99)
Glucose-Capillary: 327 mg/dL — ABNORMAL HIGH (ref 70–99)
Glucose-Capillary: 255 mg/dL — ABNORMAL HIGH (ref 70–99)

## 2018-06-02 LAB — COMPREHENSIVE METABOLIC PANEL
ALT: 14 U/L (ref 0–44)
AST: 15 U/L (ref 15–41)
Albumin: 3.6 g/dL (ref 3.5–5.0)
Alkaline Phosphatase: 105 U/L (ref 38–126)
Anion gap: 12 (ref 5–15)
BUN: 37 mg/dL — ABNORMAL HIGH (ref 6–20)
CO2: 27 mmol/L (ref 22–32)
Calcium: 8.4 mg/dL — ABNORMAL LOW (ref 8.9–10.3)
Chloride: 97 mmol/L — ABNORMAL LOW (ref 98–111)
Creatinine, Ser: 1.31 mg/dL — ABNORMAL HIGH (ref 0.61–1.24)
GFR calc Af Amer: >60 mL/min (ref >60)
GFR calc non Af Amer: >60 mL/min (ref >60)
Glucose, Bld: 264 mg/dL — ABNORMAL HIGH (ref 70–99)
Potassium: 3.7 mmol/L (ref 3.5–5.1)
Sodium: 136 mmol/L (ref 135–145)
Total Bilirubin: 0.6 mg/dL (ref 0.3–1.2)
Total Protein: 7.4 g/dL (ref 6.5–8.1)

## 2018-06-02 LAB — HEMOGLOBIN A1C
Hgb A1c MFr Bld: 10.1 % — ABNORMAL HIGH (ref 4.8–5.6)
Mean Plasma Glucose: 243.17 mg/dL

## 2018-06-02 LAB — HEMOGLOBIN AND HEMATOCRIT, BLOOD
HCT: 38 % — ABNORMAL LOW (ref 39.0–52.0)
HCT: 39.8 % (ref 39.0–52.0)
Hemoglobin: 12 g/dL — ABNORMAL LOW (ref 13.0–17.0)
Hemoglobin: 12.7 g/dL — ABNORMAL LOW (ref 13.0–17.0)

## 2018-06-02 LAB — TSH: TSH: 2.503 u[IU]/mL (ref 0.350–4.500)

## 2018-06-02 LAB — LIPASE, BLOOD: Lipase: 20 U/L (ref 11–51)

## 2018-06-02 LAB — PROCALCITONIN: Procalcitonin: <0.1 ng/mL

## 2018-06-02 LAB — LACTIC ACID, PLASMA
Lactic Acid, Venous: 1.8 mmol/L (ref 0.5–1.9)
Lactic Acid, Venous: 2.8 mmol/L (ref 0.5–1.9)

## 2018-06-02 LAB — PROTIME-INR
INR: 1 (ref 0.8–1.2)
Prothrombin Time: 13.5 seconds (ref 11.4–15.2)

## 2018-06-02 LAB — TYPE AND SCREEN
ABO/RH(D): O NEG
Antibody Screen: NEGATIVE

## 2018-06-02 LAB — MRSA PCR SCREENING: MRSA by PCR: POSITIVE — AB

## 2018-06-02 MED ORDER — ACETAMINOPHEN 325 MG PO TABS: 650.0000 mg | ORAL_TABLET | Freq: Four times a day (QID) | ORAL | Status: DC | PRN

## 2018-06-02 MED ORDER — POTASSIUM CHLORIDE CRYS ER 20 MEQ PO TBCR
20.0000 meq | EXTENDED_RELEASE_TABLET | Freq: Two times a day (BID) | ORAL | Status: DC
  Administered 2018-06-02 – 2018-06-03 (×3): 20 meq via ORAL
  Filled 2018-06-02 (×3): qty 1

## 2018-06-02 MED ORDER — LACTULOSE 10 GM/15ML PO SOLN
20.0000 g | Freq: Two times a day (BID) | ORAL | Status: AC
  Administered 2018-06-02 – 2018-06-03 (×2): 20 g via ORAL
  Filled 2018-06-02 (×2): qty 30

## 2018-06-02 MED ORDER — MORPHINE SULFATE (PF) 4 MG/ML IV SOLN
4.0000 mg | INTRAVENOUS | Status: DC | PRN
  Administered 2018-06-02: 4 mg via INTRAVENOUS
  Filled 2018-06-02: qty 1

## 2018-06-02 MED ORDER — DOCUSATE SODIUM 100 MG PO CAPS
100.0000 mg | ORAL_CAPSULE | Freq: Two times a day (BID) | ORAL | Status: DC
  Administered 2018-06-02 – 2018-06-03 (×3): 100 mg via ORAL
  Filled 2018-06-02 (×3): qty 1

## 2018-06-02 MED ORDER — INSULIN GLARGINE 100 UNIT/ML ~~LOC~~ SOLN
80.0000 [IU] | Freq: Every day | SUBCUTANEOUS | Status: DC
  Administered 2018-06-02: 80 [IU] via SUBCUTANEOUS
  Filled 2018-06-02 (×2): qty 0.8

## 2018-06-02 MED ORDER — SODIUM CHLORIDE 0.9 % IV SOLN
INTRAVENOUS | Status: DC | PRN
  Administered 2018-06-02 – 2018-06-03 (×2): via INTRAVENOUS

## 2018-06-02 MED ORDER — CLOTRIMAZOLE 1 % EX CREA
1.0000 "application " | TOPICAL_CREAM | Freq: Two times a day (BID) | CUTANEOUS | Status: DC
  Administered 2018-06-02 – 2018-06-03 (×3): 1 via TOPICAL
  Filled 2018-06-02: qty 15

## 2018-06-02 MED ORDER — FUROSEMIDE 40 MG PO TABS
120.0000 mg | ORAL_TABLET | Freq: Two times a day (BID) | ORAL | Status: DC
  Administered 2018-06-03: 120 mg via ORAL
  Filled 2018-06-02: qty 3

## 2018-06-02 MED ORDER — MORPHINE SULFATE (PF) 2 MG/ML IV SOLN
2.0000 mg | INTRAVENOUS | Status: DC | PRN
  Filled 2018-06-02: qty 1

## 2018-06-02 MED ORDER — SODIUM CHLORIDE 0.9 % IV BOLUS
1000.0000 mL | Freq: Once | INTRAVENOUS | Status: AC
  Administered 2018-06-02: 1000 mL via INTRAVENOUS

## 2018-06-02 MED ORDER — COLLAGENASE 250 UNIT/GM EX OINT
TOPICAL_OINTMENT | Freq: Every day | CUTANEOUS | Status: DC
  Administered 2018-06-02 – 2018-06-03 (×2): via TOPICAL
  Filled 2018-06-02: qty 30

## 2018-06-02 MED ORDER — METOLAZONE 5 MG PO TABS
10.0000 mg | ORAL_TABLET | Freq: Every day | ORAL | Status: DC
  Administered 2018-06-02 – 2018-06-03 (×2): 10 mg via ORAL
  Filled 2018-06-02 (×2): qty 2

## 2018-06-02 MED ORDER — INSULIN ASPART 100 UNIT/ML ~~LOC~~ SOLN: 0.0000 [IU] | Freq: Three times a day (TID) | SUBCUTANEOUS | Status: DC

## 2018-06-02 MED ORDER — MUPIROCIN 2 % EX OINT
1.0000 "application " | TOPICAL_OINTMENT | Freq: Two times a day (BID) | CUTANEOUS | Status: DC
  Administered 2018-06-02 – 2018-06-03 (×3): 1 via NASAL
  Filled 2018-06-02: qty 22

## 2018-06-02 MED ORDER — OXYCODONE HCL 5 MG PO TABS
5.0000 mg | ORAL_TABLET | Freq: Three times a day (TID) | ORAL | Status: DC | PRN
  Administered 2018-06-03: 5 mg via ORAL
  Filled 2018-06-02: qty 1

## 2018-06-02 MED ORDER — CLINDAMYCIN PHOSPHATE 600 MG/50ML IV SOLN
600.0000 mg | Freq: Three times a day (TID) | INTRAVENOUS | Status: DC
  Administered 2018-06-02 – 2018-06-03 (×4): 600 mg via INTRAVENOUS
  Filled 2018-06-02 (×6): qty 50

## 2018-06-02 MED ORDER — CHLORHEXIDINE GLUCONATE CLOTH 2 % EX PADS: 6.0000 | MEDICATED_PAD | Freq: Every day | CUTANEOUS | Status: DC

## 2018-06-02 MED ORDER — PREGABALIN 75 MG PO CAPS
150.0000 mg | ORAL_CAPSULE | Freq: Three times a day (TID) | ORAL | Status: DC
  Administered 2018-06-02 – 2018-06-03 (×4): 150 mg via ORAL
  Filled 2018-06-02 (×4): qty 2

## 2018-06-02 MED ORDER — TORSEMIDE 20 MG PO TABS
20.0000 mg | ORAL_TABLET | Freq: Every day | ORAL | Status: DC
  Administered 2018-06-02 – 2018-06-03 (×2): 20 mg via ORAL
  Filled 2018-06-02 (×2): qty 1

## 2018-06-02 MED ORDER — ACETAMINOPHEN 650 MG RE SUPP: 650.0000 mg | Freq: Four times a day (QID) | RECTAL | Status: DC | PRN

## 2018-06-02 MED ORDER — LISINOPRIL 10 MG PO TABS
10.0000 mg | ORAL_TABLET | Freq: Every day | ORAL | Status: DC
  Administered 2018-06-02 – 2018-06-03 (×2): 10 mg via ORAL
  Filled 2018-06-02 (×2): qty 1

## 2018-06-02 MED ORDER — CARVEDILOL 6.25 MG PO TABS
6.2500 mg | ORAL_TABLET | Freq: Two times a day (BID) | ORAL | Status: DC
  Administered 2018-06-02 – 2018-06-03 (×3): 6.25 mg via ORAL
  Filled 2018-06-02 (×3): qty 1

## 2018-06-02 MED ORDER — INSULIN ASPART 100 UNIT/ML ~~LOC~~ SOLN: 45.0000 [IU] | Freq: Three times a day (TID) | SUBCUTANEOUS | Status: DC

## 2018-06-02 MED ORDER — DULOXETINE HCL 30 MG PO CPEP
60.0000 mg | ORAL_CAPSULE | Freq: Every day | ORAL | Status: DC
  Administered 2018-06-02 – 2018-06-03 (×2): 60 mg via ORAL
  Filled 2018-06-02 (×2): qty 2

## 2018-06-02 MED ORDER — ATORVASTATIN CALCIUM 20 MG PO TABS
40.0000 mg | ORAL_TABLET | Freq: Every day | ORAL | Status: DC
  Administered 2018-06-02 – 2018-06-03 (×2): 40 mg via ORAL
  Filled 2018-06-02 (×2): qty 2

## 2018-06-02 MED ORDER — SENNOSIDES-DOCUSATE SODIUM 8.6-50 MG PO TABS
2.0000 | ORAL_TABLET | Freq: Two times a day (BID) | ORAL | Status: DC
  Administered 2018-06-02 – 2018-06-03 (×3): 2 via ORAL
  Filled 2018-06-02 (×3): qty 2

## 2018-06-02 MED ORDER — SODIUM CHLORIDE 0.9 % IV SOLN
2.0000 g | Freq: Once | INTRAVENOUS | Status: AC
  Administered 2018-06-02: 2 g via INTRAVENOUS
  Filled 2018-06-02: qty 20

## 2018-06-02 MED ORDER — INSULIN ASPART 100 UNIT/ML ~~LOC~~ SOLN
45.0000 [IU] | Freq: Three times a day (TID) | SUBCUTANEOUS | Status: DC
  Administered 2018-06-03 (×2): 45 [IU] via SUBCUTANEOUS
  Filled 2018-06-02 (×2): qty 1

## 2018-06-02 MED ORDER — SODIUM CHLORIDE 0.9 % IV SOLN
INTRAVENOUS | Status: DC
  Administered 2018-06-02 (×2): via INTRAVENOUS

## 2018-06-02 MED ORDER — CLINDAMYCIN PHOSPHATE 600 MG/50ML IV SOLN
600.0000 mg | Freq: Once | INTRAVENOUS | Status: AC
  Administered 2018-06-02: 600 mg via INTRAVENOUS
  Filled 2018-06-02: qty 50

## 2018-06-02 MED ORDER — KETOTIFEN FUMARATE 0.025 % OP SOLN
1.0000 [drp] | Freq: Two times a day (BID) | OPHTHALMIC | Status: DC
  Administered 2018-06-02 – 2018-06-03 (×3): 1 [drp] via OPHTHALMIC
  Filled 2018-06-02: qty 5

## 2018-06-02 MED ORDER — ONDANSETRON HCL 4 MG/2ML IJ SOLN: 4.0000 mg | Freq: Four times a day (QID) | INTRAMUSCULAR | Status: DC | PRN

## 2018-06-02 MED ORDER — INSULIN ASPART 100 UNIT/ML ~~LOC~~ SOLN
0.0000 [IU] | SUBCUTANEOUS | Status: DC
  Administered 2018-06-02: 11 [IU] via SUBCUTANEOUS
  Administered 2018-06-02: 4 [IU] via SUBCUTANEOUS
  Administered 2018-06-02: 7 [IU] via SUBCUTANEOUS
  Administered 2018-06-02 (×2): 15 [IU] via SUBCUTANEOUS
  Administered 2018-06-03 (×3): 7 [IU] via SUBCUTANEOUS
  Filled 2018-06-02 (×8): qty 1

## 2018-06-02 MED ORDER — PIPERACILLIN-TAZOBACTAM 3.375 G IVPB
3.3750 g | Freq: Three times a day (TID) | INTRAVENOUS | Status: DC
  Administered 2018-06-02 – 2018-06-03 (×4): 3.375 g via INTRAVENOUS
  Filled 2018-06-02 (×4): qty 50

## 2018-06-02 MED ORDER — ONDANSETRON HCL 4 MG PO TABS: 4.0000 mg | ORAL_TABLET | Freq: Four times a day (QID) | ORAL | Status: DC | PRN

## 2018-06-02 MED ORDER — VENLAFAXINE HCL ER 75 MG PO CP24
75.0000 mg | ORAL_CAPSULE | Freq: Every day | ORAL | Status: DC
  Administered 2018-06-02 – 2018-06-03 (×2): 75 mg via ORAL
  Filled 2018-06-02 (×2): qty 1

## 2018-06-02 MED ORDER — ENOXAPARIN SODIUM 40 MG/0.4ML ~~LOC~~ SOLN
40.0000 mg | Freq: Two times a day (BID) | SUBCUTANEOUS | Status: DC
  Administered 2018-06-02 – 2018-06-03 (×3): 40 mg via SUBCUTANEOUS
  Filled 2018-06-02 (×3): qty 0.4

## 2018-06-02 NOTE — Progress Notes (Signed)
PHARMACIST - PHYSICIAN COMMUNICATION  CONCERNING:  Enoxaparin (Lovenox) for DVT Prophylaxis    RECOMMENDATION: Patient was prescribed enoxaprin 40mg  q24 hours for VTE prophylaxis.   Filed Weights   06/02/18 0339  Weight: (!) 395 lb 15.1 oz (179.6 kg)    Body mass index is 55.22 kg/m.  Estimated Creatinine Clearance: 107.9 mL/min (A) (by C-G formula based on SCr of 1.31 mg/dL (H)).   Based on [place] patient is candidate for enoxaparin 40mg  every 12 hour dosing due to BMI being >40.  DESCRIPTION: Pharmacy has adjusted enoxaparin dose per [facility] policy.  Patient is now receiving enoxaparin 40mg  every 12 hours.   Pernell Dupre, PharmD, BCPS Clinical Pharmacist 06/02/2018 3:50 AM

## 2018-06-02 NOTE — Consult Note (Addendum)
[place] Nurse wound consult note Reason for Consult: Consult requested for left foot.  Pt is familiar to [facility] team from previous admission, refer to progress note on 12/4.  He has been using Santyl prior to admission "until he ran out of supplies."  He states he developed edema and a blister to left foot which has evolved into a full thickness wound at this time. Wound type: Full thickness wound to left anterior foot; 7X6cm; 60% moist, black soft eschar, 40% red moist outer wound edges with peeling skin surrounding outer locations.  Entire left foot and leg have generalized erythremia and edema. Pt is on systemic antibiotics for coverage of cellulitis.  Drainage (amount, consistency, odor) mod amt pink drainage, no odor Dressing procedure/placement/frequency: Santyl to provide enzymatic debridement of nonviable tissue.  Discussed plan of care with patient.  Please re-consult if further assistance is needed.  Thank-you,  Julien Girt MSN, [place], [place], [place], [place]

## 2018-06-02 NOTE — Progress Notes (Addendum)
Inpatient Diabetes Program Recommendations  AACE/ADA: New Consensus Statement on Inpatient Glycemic Control (2015)  Target Ranges:  Prepandial:   less than 140 mg/dL      Peak postprandial:   less than 180 mg/dL (1-2 hours)      Critically ill patients:  140 - 180 mg/dL   Results for Russell [name], Russell [name] (MRN [id number]) as of 06/02/2018 10:51  Ref. Range 06/02/2018 03:58 06/02/2018 08:09  Glucose-Capillary Latest Ref Range: 70 - 99 mg/dL 250 (H)  7 units NOVOLOG +  80 units LANTUS 198 (H)  4 units NOVOLOG       Admit LLE Cellulitis/ GIB--Concern for ischemic colitis or early sepsis secondary to diverticulitis  History: DM  Home DM Meds: Regular Insulin (U100) 80 units TID        Trulicity 1.5 mg Qweek  Current Orders: Lantus 80 units QHS      Novolog Resistant Correction Scale/ SSI (0-20 units) Q4 hours      Novolog 45 units TID with meals (not to start until 04/04 AM)     PCP: Dr. Ouida Sills with [name]--Last seen 05/16/2018--Per notes, pt refuses to take Lantus or other basal insulins.  Current A1c level pending.  Lantus started last night at 4am. Novolog SSI started last night at 5am.   Novolog 45 units TID with meals NOT scheduled to start until tomorrow AM (04/04) as pt is currently NPO.   Per Chart review, DM Coordinator had long conversation with pt back in December--Pt told DM Coordinator he does not want to restart basal insulin b/c it made him "go low". Patient Admitted to not taking Trulicity (b/c it didn't work for him) and admitted to taking the Regular Insulin TID inconsistently.  DM Coordinator asked pt to seek out care under ENDO and to inquire about U500 insulin possibly for home use to try to improve CBG control at home.  CBG down to 198 this AM.     --Will follow patient during hospitalization--  Wyn Quaker RN, MSN, CDE Diabetes Coordinator Inpatient Glycemic Control Team Team Pager: [phone] (8a-5p)

## 2018-06-02 NOTE — ED Notes (Signed)
ED TO INPATIENT HANDOFF REPORT  ED Nurse Name and Phone #: Gwynn Burly Name/Age/Gender Russell [name] 53 y.o. male Room/Bed: ED18A/ED18A  Code Status   Code Status: Prior  Home/SNF/Other Home Patient oriented to: self, place, time and situation Is this baseline? Yes   Triage Complete: Triage complete  Chief Complaint rectal bleeding  Triage Note Pt from home with c/o rectal bleeding that started this evening. PT states he noticed clots and c/o abd pain. MD at bedside    Allergies Allergies  Allergen Reactions  . Shellfish Allergy Hives    Level of Care/Admitting Diagnosis ED Disposition    ED Disposition Condition [facility] Area: [place] [100120]  Level of Care: Med-Surg [16]  Diagnosis: Cellulitis [338250]  Admitting Physician: Harrie Foreman [[id number]]  Attending Physician: Harrie Foreman [[id number]]  PT Class (Do Not Modify): Observation [104]  PT Acc Code (Do Not Modify): Observation [10022]       B Medical/Surgery History Past Medical History:  Diagnosis Date  . CHF (congestive heart failure) (HCC)    EF 30-35%  . Depression   . Diabetes mellitus without complication ([place])   . Hyperlipemia   . Hypertension    Past Surgical History:  Procedure Laterality Date  . APPENDECTOMY    . BELOW KNEE LEG AMPUTATION     Right leg  . TONSILLECTOMY       A IV Location/Drains/Wounds Patient Lines/Drains/Airways Status   Active Line/Drains/Airways    Name:   Placement date:   Placement time:   Site:   Days:   Peripheral IV 06/02/18 Antecubital   06/02/18    0019    Antecubital   less than 1   Peripheral IV 06/02/18 Right Hand   06/02/18    0020    Hand   less than 1   Wound / Incision (Open or Dehisced) 02/01/18 Leg Left;Anterior   02/01/18    1757    Leg   121          Intake/Output Last 24 hours No intake or output data in the 24 hours ending 06/02/18 0258  Labs/Imaging Results for orders placed or  performed during the hospital encounter of 06/01/18 (from the past 48 hour(s))  Lactic acid, plasma     Status: None   Collection Time: 06/01/18 11:59 PM  Result Value Ref Range   Lactic Acid, Venous 1.8 0.5 - 1.9 mmol/L    Comment: Performed at [facility], [address] [place], [place] [address]  Comprehensive metabolic panel     Status: Abnormal   Collection Time: 06/01/18 11:59 PM  Result Value Ref Range   Sodium 136 135 - 145 mmol/L   Potassium 3.7 3.5 - 5.1 mmol/L   Chloride 97 (L) 98 - 111 mmol/L   CO2 27 22 - 32 mmol/L   Glucose, Bld 264 (H) 70 - 99 mg/dL   BUN 37 (H) 6 - 20 mg/dL   Creatinine, Ser 1.31 (H) 0.61 - 1.24 mg/dL   Calcium 8.4 (L) 8.9 - 10.3 mg/dL   Total Protein 7.4 6.5 - 8.1 g/dL   Albumin 3.6 3.5 - 5.0 g/dL   AST 15 15 - 41 U/L   ALT 14 0 - 44 U/L   Alkaline Phosphatase 105 38 - 126 U/L   Total Bilirubin 0.6 0.3 - 1.2 mg/dL   GFR calc non Af Amer >60 >60 mL/min   GFR calc Af Amer >60 >60  mL/min   Anion gap 12 5 - 15    Comment: Performed at [facility], [address] [place], [place] [address]  Lipase, blood     Status: None   Collection Time: 06/01/18 11:59 PM  Result Value Ref Range   Lipase 20 11 - 51 U/L    Comment: Performed at [facility], [address] [place], [place] [address]  CBC WITH DIFFERENTIAL     Status: Abnormal   Collection Time: 06/01/18 11:59 PM  Result Value Ref Range   WBC 8.8 4.0 - 10.5 K/uL   RBC 4.89 4.22 - 5.81 MIL/uL   Hemoglobin 12.7 (L) 13.0 - 17.0 g/dL   HCT 39.7 39.0 - 52.0 %   MCV 81.2 80.0 - 100.0 fL   MCH 26.0 26.0 - 34.0 pg   MCHC 32.0 30.0 - 36.0 g/dL   RDW 14.5 11.5 - 15.5 %   Platelets 229 150 - 400 K/uL   nRBC 0.0 0.0 - 0.2 %   Neutrophils Relative % 73 %   Neutro Abs 6.5 1.7 - 7.7 K/uL   Lymphocytes Relative 16 %   Lymphs Abs 1.4 0.7 - 4.0 K/uL   Monocytes Relative 7 %   Monocytes Absolute 0.6 0.1 - 1.0 K/uL   Eosinophils Relative 3 %   Eosinophils Absolute 0.2  0.0 - 0.5 K/uL   Basophils Relative 0 %   Basophils Absolute 0.0 0.0 - 0.1 K/uL   Immature Granulocytes 1 %   Abs Immature Granulocytes 0.06 0.00 - 0.07 K/uL    Comment: Performed at [facility], [address] [place], [place] [address]  Procalcitonin     Status: None   Collection Time: 06/01/18 11:59 PM  Result Value Ref Range   Procalcitonin <0.10 ng/mL    Comment:        Interpretation: PCT (Procalcitonin) <= 0.5 ng/mL: Systemic infection (sepsis) is not likely. Local bacterial infection is possible. (NOTE)       Sepsis PCT Algorithm           Lower Respiratory Tract                                      Infection PCT Algorithm    ----------------------------     ----------------------------         PCT < 0.25 ng/mL                PCT < 0.10 ng/mL         Strongly encourage             Strongly discourage   discontinuation of antibiotics    initiation of antibiotics    ----------------------------     -----------------------------       PCT 0.25 - 0.50 ng/mL            PCT 0.10 - 0.25 ng/mL               OR       >80% decrease in PCT            Discourage initiation of                                            antibiotics      Encourage discontinuation  of antibiotics    ----------------------------     -----------------------------         PCT >= 0.50 ng/mL              PCT 0.26 - 0.50 ng/mL               AND        <80% decrease in PCT             Encourage initiation of                                             antibiotics       Encourage continuation           of antibiotics    ----------------------------     -----------------------------        PCT >= 0.50 ng/mL                  PCT > 0.50 ng/mL               AND         increase in PCT                  Strongly encourage                                      initiation of antibiotics    Strongly encourage escalation           of antibiotics                                      -----------------------------                                           PCT <= 0.25 ng/mL                                                 OR                                        > 80% decrease in PCT                                     Discontinue / Do not initiate                                             antibiotics Performed at [facility], [address] [place], [place] [address]   Protime-INR     Status: None   Collection Time: 06/01/18 11:59 PM  Result Value Ref Range   Prothrombin Time 13.5 11.4 - 15.2 seconds   INR 1.0  0.8 - 1.2    Comment: (NOTE) INR goal varies based on device and disease states. Performed at [facility], [address] [place], [place] [address]   Type and screen [place]     Status: None   Collection Time: 06/01/18 11:59 PM  Result Value Ref Range   ABO/RH(D) O NEG    Antibody Screen NEG    Sample Expiration      06/04/2018 Performed at [facility], [address] [place], [place] [address]    Dg Chest Port 1 View  Result Date: 06/02/2018 CLINICAL DATA:  Rectal bleeding, abdominal pain EXAM: PORTABLE CHEST 1 VIEW COMPARISON:  05/18/2017 FINDINGS: Lungs are essentially clear. Mild left basilar scarring, chronic. No pleural effusion or pneumothorax. The heart is top-normal in size. IMPRESSION: No evidence of acute cardiopulmonary disease. Electronically Signed   By: Julian Hy M.D.   On: 06/02/2018 00:20    Pending Labs Unresulted Labs (From admission, onward)    Start     Ordered   06/02/18 0013  Aerobic/Anaerobic Culture (surgical/deep wound)  Once,   STAT    Question:  Patient immune status  Answer:  Normal   06/02/18 0012   06/01/18 2353  Lactic acid, plasma  STAT Now then every 3 hours,   STAT     06/01/18 2354   06/01/18 2353  Blood Culture (routine x 2)  BLOOD CULTURE X 2,   STAT     06/01/18 2354   06/01/18 2353  Urinalysis, Complete w Microscopic  ONCE - STAT,   STAT      06/01/18 2354   06/01/18 2353  Urine culture  Add-on,   AD     06/01/18 2354   Signed and Held  Creatinine, serum  (enoxaparin (LOVENOX)    CrCl >/= 30 ml/min)  Weekly,   R    Comments:  while on enoxaparin therapy    Signed and Held   Signed and Held  TSH  Add-on,   R     Signed and Held   Signed and Held  Hemoglobin A1c  Add-on,   R     Signed and Held          Vitals/Pain Today's Vitals   06/02/18 0200 06/02/18 0215 06/02/18 0230 06/02/18 0245  BP:      Pulse: 78 69 70 69  Resp: 18 14 17 13   Temp:      TempSrc:      SpO2: 94% 95% 92% 94%  PainSc:        Isolation Precautions No active isolations  Medications Medications  clindamycin (CLEOCIN) IVPB 600 mg (0 mg Intravenous Stopped 06/02/18 0109)  cefTRIAXone (ROCEPHIN) 2 g in sodium chloride 0.9 % 100 mL IVPB (2 g Intravenous New Bag/Given 06/02/18 0109)    Mobility walks with device Low fall risk   Focused Assessments Neuro Assessment Handoff:  Swallow screen pass? Yes          Neuro Assessment:   Neuro Checks:      Last Documented NIHSS Modified Score:   Has TPA been given? No If patient is a Neuro Trauma and patient is going to OR before floor call report to [place] nurse: [phone] or [phone]     R Recommendations: See Admitting Provider Note  Report given to:   Additional Notes: none

## 2018-06-02 NOTE — ED Notes (Signed)
This RN walked into pt's room as pt was laying on his side at the very edge of the bed with the side rail down. When this RN attempted to pull up the rail pt stated he needed it to be down because he needed to sit up every once in a while. Call bell within reach, this RN educated to use it when pt needed to sit up or adjust in bed but pt still refused to have side rail pulled up. Educated this is a safety risk. Pt stated he also told Jenny Reichmann, RN that he had to have the side rail down while he laid against edge of bed. Stated John, RN attempted to raise the rail as well but pt refused.

## 2018-06-02 NOTE — H&P (Signed)
Russell [name] is an 53 y.o. male.   Chief Complaint: Leg pain HPI: The patient with past medical history of diabetes, hypertension and CHF presents to the emergency department complaining of leg pain.  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  He states that he had run out of dressings for his chronic wound.  He denies fever, nausea or vomiting.  However the patient admits that he has had bloody stools.  Initially he denied abdominal pain, but during this examiner's interview he endorsed left-sided abdominal pain greater than right.  He also admits that he has not had any bowel movements but feels very full.  Due to the aforementioned problems the emergency department staff, hospitalist service for further management.  Past Medical History:  Diagnosis Date  . CHF (congestive heart failure) (HCC)    EF 30-35%  . Depression   . Diabetes mellitus without complication ([place])   . Hyperlipemia   . Hypertension     Past Surgical History:  Procedure Laterality Date  . APPENDECTOMY    . BELOW KNEE LEG AMPUTATION     Right leg  . TONSILLECTOMY      Family History  Problem Relation Age of Onset  . COPD Mother   . Diabetes Father   . CAD Father   . Kidney cancer Brother    Social History:  reports that he has never smoked. He has never used smokeless tobacco. He reports that he does not drink alcohol or use drugs.  Allergies:  Allergies  Allergen Reactions  . Shellfish Allergy Hives    Medications Prior to Admission  Medication Sig Dispense Refill  . atorvastatin (LIPITOR) 40 MG tablet Take 40 mg by mouth daily.     Marland Kitchen azelastine (OPTIVAR) 0.05 % ophthalmic solution Place 1 drop into both eyes 2 (two) times daily.    . carvedilol (COREG) 6.25 MG tablet Take 6.25 mg by mouth 2 (two) times daily with a meal.    . clotrimazole (LOTRIMIN) 1 % cream Apply 1 application topically 2 (two) times daily.    . collagenase (SANTYL) ointment Apply  topically daily. 15 g 0  . Dulaglutide (TRULICITY) 1.5 TD/3.2KG SOPN Inject 1.5 mg into the skin once a week.    . DULoxetine (CYMBALTA) 60 MG capsule Take 60 mg by mouth daily.  11  . insulin regular (NOVOLIN R,HUMULIN R) 100 units/mL injection Inject 80 Units into the skin 3 (three) times daily before meals.     Marland Kitchen lisinopril (PRINIVIL,ZESTRIL) 10 MG tablet Take 10 mg by mouth daily.    . metolazone (ZAROXOLYN) 10 MG tablet Take 10 mg by mouth daily.  3  . potassium chloride SA (K-DUR,KLOR-CON) 20 MEQ tablet Take 20 mEq by mouth 2 (two) times daily.  3  . pregabalin (LYRICA) 150 MG capsule Take 150 mg by mouth 3 (three) times daily.     Marland Kitchen senna-docusate (SENOKOT-S) 8.6-50 MG tablet Take 2 tablets by mouth 2 (two) times daily.    Marland Kitchen torsemide (DEMADEX) 20 MG tablet Take 20 mg by mouth daily.    Marland Kitchen venlafaxine XR (EFFEXOR-XR) 75 MG 24 hr capsule Take 75 mg by mouth daily with breakfast.      Results for orders placed or performed during the hospital encounter of 06/01/18 (from the past 48 hour(s))  Blood Culture (routine x 2)     Status: None (Preliminary result)   Collection Time: 06/01/18 11:44 PM  Result Value  Ref Range   Specimen Description BLOOD RIGHT ANTECUBITAL    Special Requests      BOTTLES DRAWN AEROBIC AND ANAEROBIC Blood Culture adequate volume   Culture      NO GROWTH < 12 HOURS Performed at [facility], [address] [place], [place] [address]    Report Status PENDING   Lactic acid, plasma     Status: None   Collection Time: 06/01/18 11:59 PM  Result Value Ref Range   Lactic Acid, Venous 1.8 0.5 - 1.9 mmol/L    Comment: Performed at [facility], [address] [place], [place] [address]  Comprehensive metabolic panel     Status: Abnormal   Collection Time: 06/01/18 11:59 PM  Result Value Ref Range   Sodium 136 135 - 145 mmol/L   Potassium 3.7 3.5 - 5.1 mmol/L   Chloride 97 (L) 98 - 111 mmol/L   CO2 27 22 - 32 mmol/L   Glucose, Bld 264 (H) 70  - 99 mg/dL   BUN 37 (H) 6 - 20 mg/dL   Creatinine, Ser 1.31 (H) 0.61 - 1.24 mg/dL   Calcium 8.4 (L) 8.9 - 10.3 mg/dL   Total Protein 7.4 6.5 - 8.1 g/dL   Albumin 3.6 3.5 - 5.0 g/dL   AST 15 15 - 41 U/L   ALT 14 0 - 44 U/L   Alkaline Phosphatase 105 38 - 126 U/L   Total Bilirubin 0.6 0.3 - 1.2 mg/dL   GFR calc non Af Amer >60 >60 mL/min   GFR calc Af Amer >60 >60 mL/min   Anion gap 12 5 - 15    Comment: Performed at [facility], [address] [place], [place] [address]  Lipase, blood     Status: None   Collection Time: 06/01/18 11:59 PM  Result Value Ref Range   Lipase 20 11 - 51 U/L    Comment: Performed at [facility], [address] [place], [place] [address]  CBC WITH DIFFERENTIAL     Status: Abnormal   Collection Time: 06/01/18 11:59 PM  Result Value Ref Range   WBC 8.8 4.0 - 10.5 K/uL   RBC 4.89 4.22 - 5.81 MIL/uL   Hemoglobin 12.7 (L) 13.0 - 17.0 g/dL   HCT 39.7 39.0 - 52.0 %   MCV 81.2 80.0 - 100.0 fL   MCH 26.0 26.0 - 34.0 pg   MCHC 32.0 30.0 - 36.0 g/dL   RDW 14.5 11.5 - 15.5 %   Platelets 229 150 - 400 K/uL   nRBC 0.0 0.0 - 0.2 %   Neutrophils Relative % 73 %   Neutro Abs 6.5 1.7 - 7.7 K/uL   Lymphocytes Relative 16 %   Lymphs Abs 1.4 0.7 - 4.0 K/uL   Monocytes Relative 7 %   Monocytes Absolute 0.6 0.1 - 1.0 K/uL   Eosinophils Relative 3 %   Eosinophils Absolute 0.2 0.0 - 0.5 K/uL   Basophils Relative 0 %   Basophils Absolute 0.0 0.0 - 0.1 K/uL   Immature Granulocytes 1 %   Abs Immature Granulocytes 0.06 0.00 - 0.07 K/uL    Comment: Performed at [facility], [address] [place],  [address]  Procalcitonin     Status: None   Collection Time: 06/01/18 11:59 PM  Result Value Ref Range   Procalcitonin <0.10 ng/mL    Comment:        Interpretation: PCT (Procalcitonin) <= 0.5 ng/mL: Systemic infection (sepsis) is not likely.  Local bacterial infection is possible. (NOTE)       Sepsis PCT Algorithm           Lower  Respiratory Tract                                      Infection PCT Algorithm    ----------------------------     ----------------------------         PCT < 0.25 ng/mL                PCT < 0.10 ng/mL         Strongly encourage             Strongly discourage   discontinuation of antibiotics    initiation of antibiotics    ----------------------------     -----------------------------       PCT 0.25 - 0.50 ng/mL            PCT 0.10 - 0.25 ng/mL               OR       >80% decrease in PCT            Discourage initiation of                                            antibiotics      Encourage discontinuation           of antibiotics    ----------------------------     -----------------------------         PCT >= 0.50 ng/mL              PCT 0.26 - 0.50 ng/mL               AND        <80% decrease in PCT             Encourage initiation of                                             antibiotics       Encourage continuation           of antibiotics    ----------------------------     -----------------------------        PCT >= 0.50 ng/mL                  PCT > 0.50 ng/mL               AND         increase in PCT                  Strongly encourage                                      initiation of antibiotics    Strongly encourage escalation           of antibiotics                                     -----------------------------  PCT <= 0.25 ng/mL                                                 OR                                        > 80% decrease in PCT                                     Discontinue / Do not initiate                                             antibiotics Performed at [facility], [address] [place], [place] [address]   Protime-INR     Status: None   Collection Time: 06/01/18 11:59 PM  Result Value Ref Range   Prothrombin Time 13.5 11.4 - 15.2 seconds   INR 1.0 0.8 - 1.2    Comment: (NOTE) INR goal  varies based on device and disease states. Performed at [facility], [address] [place], [place] [address]   Blood Culture (routine x 2)     Status: None (Preliminary result)   Collection Time: 06/01/18 11:59 PM  Result Value Ref Range   Specimen Description BLOOD BLOOD RIGHT HAND    Special Requests      BOTTLES DRAWN AEROBIC AND ANAEROBIC Blood Culture results may not be optimal due to an excessive volume of blood received in culture bottles   Culture      NO GROWTH < 12 HOURS Performed at [facility], [address], [place], [place] [address]    Report Status PENDING   Type and screen [place]     Status: None   Collection Time: 06/01/18 11:59 PM  Result Value Ref Range   ABO/RH(D) O NEG    Antibody Screen NEG    Sample Expiration      06/04/2018 Performed at [facility], [address] [place], [place] [address]   Glucose, capillary     Status: Abnormal   Collection Time: 06/02/18  3:58 AM  Result Value Ref Range   Glucose-Capillary 250 (H) 70 - 99 mg/dL  Lactic acid, plasma     Status: Abnormal   Collection Time: 06/02/18  4:04 AM  Result Value Ref Range   Lactic Acid, Venous 2.8 (HH) 0.5 - 1.9 mmol/L    Comment: CRITICAL RESULT CALLED TO, READ BACK BY AND VERIFIED WITH KIM OLIVER AT 0434 06/02/2018 SMA Performed at [facility], [address] [place], [place] [address]   TSH     Status: None   Collection Time: 06/02/18  4:04 AM  Result Value Ref Range   TSH 2.503 0.350 - 4.500 uIU/mL    Comment: Performed by a 3rd Generation assay with a functional sensitivity of <=0.01 uIU/mL. Performed at [facility], [address] [place], [place] [address]   MRSA PCR Screening     Status: Abnormal   Collection Time: 06/02/18  4:28 AM  Result Value Ref Range  MRSA by PCR POSITIVE (A) NEGATIVE    Comment:        The GeneXpert MRSA Assay (FDA approved for NASAL specimens only), is one  component of a comprehensive MRSA colonization surveillance program. It is not intended to diagnose MRSA infection nor to guide or monitor treatment for MRSA infections. RESULT CALLED TO, READ BACK BY AND VERIFIED WITH:  Sula Rumple AT 0263 06/02/18 SDR Performed at [facility], [address] [place], [place] [address]    Dg Chest South Heart 1 View  Result Date: 06/02/2018 CLINICAL DATA:  Rectal bleeding, abdominal pain EXAM: PORTABLE CHEST 1 VIEW COMPARISON:  05/18/2017 FINDINGS: Lungs are essentially clear. Mild left basilar scarring, chronic. No pleural effusion or pneumothorax. The heart is top-normal in size. IMPRESSION: No evidence of acute cardiopulmonary disease. Electronically Signed   By: Julian Hy M.D.   On: 06/02/2018 00:20    Review of Systems  Constitutional: Negative for chills and fever.  HENT: Negative for sore throat and tinnitus.   Eyes: Negative for blurred vision and redness.  Respiratory: Negative for cough and shortness of breath.   Cardiovascular: Negative for chest pain, palpitations, orthopnea and PND.  Gastrointestinal: Positive for abdominal pain and blood in stool. Negative for diarrhea, nausea and vomiting.  Genitourinary: Negative for dysuria, frequency and urgency.  Musculoskeletal: Negative for joint pain and myalgias.  Skin: Negative for rash.       No lesions  Neurological: Negative for speech change, focal weakness and weakness.  Endo/Heme/Allergies: Does not bruise/bleed easily.       No temperature intolerance  Psychiatric/Behavioral: Negative for depression and suicidal ideas.    Blood pressure 122/68, pulse 68, temperature 97.7 F (36.5 C), temperature source Oral, resp. rate 18, height 5\' 11"  (1.803 m), weight (!) 179.6 kg, SpO2 98 %. Physical Exam  Vitals reviewed. Constitutional: He is oriented to person, place, and time. He appears well-developed and well-nourished. No distress.  HENT:  Head: Normocephalic and atraumatic.   Mouth/Throat: Oropharynx is clear and moist.  Eyes: Pupils are equal, round, and reactive to light. Conjunctivae and EOM are normal. No scleral icterus.  Neck: Normal range of motion. Neck supple. No JVD present. No tracheal deviation present. No thyromegaly present.  Cardiovascular: Normal rate, regular rhythm and normal heart sounds. Exam reveals no gallop and no friction rub.  No murmur heard. Respiratory: Effort normal and breath sounds normal. No respiratory distress.  GI: Soft. Bowel sounds are normal. He exhibits no distension. There is abdominal tenderness. There is no rebound and no guarding.  Genitourinary:    Genitourinary Comments: Deferred   Musculoskeletal: Normal range of motion.  Lymphadenopathy:    He has no cervical adenopathy.  Neurological: He is alert and oriented to person, place, and time. No cranial nerve deficit.  Skin: Skin is warm and dry. No rash noted. No erythema.  Psychiatric: He has a normal mood and affect. His behavior is normal. Judgment and thought content normal.     Assessment/Plan This is a 53 year old male admitted for cellulitis. 1.  Cellulitis: Left lower extremity.  The patient does not meet criteria for sepsis at this time however following admission laboratory results showed elevated lactic acid.  Continue to monitor for signs or symptoms of sepsis.  He is hemodynamically stable.  The patient has received a dose of ceftriaxone and clindamycin.  Follow cultures for growth and sensitivities. 2.  GI bleed: Bright red blood per rectum with abdominal pain.  Insetting lactic acid I am concerned  for ischemic colitis or early sepsis secondary to diverticulitis.  Rule out perforation and dead gut.  CT scan ordered.  Consult gastroenterology.  The patient is hemodynamically stable.  I will add Zosyn to his clindamycin for anaerobic coverage 3.  CHF: Chronic; systolic.  Last EF 50% July 2019.  Continue metolazone.  Clarify medication reconciliation regarding  torsemide.  Manage blood pressure with carvedilol and lisinopril per home regimen 4.  Diabetes mellitus type 2: The patient uses very large doses of prandial insulin.  He is currently n.p.o. thus administer sliding scale insulin every 4 hours.  Resume prandial insulin once the patient is cleared to eat.  I have also started the patient on basal insulin while hospitalized. 5.  CKD: Slight bump in creatinine from most recent labs but it is better than in the past. 6.  DVT prophylaxis: SCDs 7.  GI prophylaxis: None The patient is a full code.  Time spent admission orders and patient care approximately 45 minutes  Harrie Foreman, MD 06/02/2018, 7:08 AM

## 2018-06-02 NOTE — ED Notes (Addendum)
Per provider order pt side rail let down so pt can set on side of bed , pt asked to not try to stand up without help , pt given call bell to use if he needs anything . Pt verbalized understanding.

## 2018-06-02 NOTE — Progress Notes (Signed)
This is a 53 year old male admitted for cellulitis. 1.  Cellulitis: Left lower extremity.  The patient does not meet criteria for sepsis at this time however following admission laboratory results showed elevated lactic acid.  Continue to monitor for signs or symptoms of sepsis.  He is hemodynamically stable.  The patient has received a dose of ceftriaxone and clindamycin.  Follow cultures for growth and sensitivities. 2.  GI bleed: Bright red blood per rectum with abdominal pain.  Insetting lactic acid I am concerned for ischemic colitis or early sepsis secondary to diverticulitis.  Rule out perforation and dead gut.  CT scan ordered.  Consult gastroenterology.  The patient is hemodynamically stable.  I will add Zosyn to his clindamycin for anaerobic coverage 3.  CHF: Chronic; systolic.  Last EF 50% July 2019.  Continue metolazone.  Clarify medication reconciliation regarding torsemide.  Manage blood pressure with carvedilol and lisinopril per home regimen 4.  Diabetes mellitus type 2: The patient uses very large doses of prandial insulin.  He is currently n.p.o. thus administer sliding scale insulin every 4 hours.  Resume prandial insulin once the patient is cleared to eat.  I have also started the patient on basal insulin while hospitalized. 5.  CKD: Slight bump in creatinine from most recent labs but it is better than in the past. 6.  DVT prophylaxis: SCDs 7.  GI prophylaxis: None  Agree with above

## 2018-06-03 ENCOUNTER — Encounter: Payer: Self-pay | Admitting: *Deleted

## 2018-06-03 LAB — CBC
HCT: 39.7 % (ref 39.0–52.0)
Hemoglobin: 12.4 g/dL — ABNORMAL LOW (ref 13.0–17.0)
MCH: 25.4 pg — ABNORMAL LOW (ref 26.0–34.0)
MCHC: 31.2 g/dL (ref 30.0–36.0)
MCV: 81.2 fL (ref 80.0–100.0)
Platelets: 221 10*3/uL (ref 150–400)
RBC: 4.89 MIL/uL (ref 4.22–5.81)
RDW: 14.2 % (ref 11.5–15.5)
WBC: 7.7 10*3/uL (ref 4.0–10.5)
nRBC: 0 % (ref 0.0–0.2)

## 2018-06-03 LAB — BASIC METABOLIC PANEL
Anion gap: 9 (ref 5–15)
BUN: 26 mg/dL — ABNORMAL HIGH (ref 6–20)
CO2: 29 mmol/L (ref 22–32)
Calcium: 8.3 mg/dL — ABNORMAL LOW (ref 8.9–10.3)
Chloride: 98 mmol/L (ref 98–111)
Creatinine, Ser: 1.05 mg/dL (ref 0.61–1.24)
GFR calc Af Amer: >60 mL/min (ref >60)
GFR calc non Af Amer: >60 mL/min (ref >60)
Glucose, Bld: 297 mg/dL — ABNORMAL HIGH (ref 70–99)
Potassium: 3.8 mmol/L (ref 3.5–5.1)
Sodium: 136 mmol/L (ref 135–145)

## 2018-06-03 LAB — URINE CULTURE: Culture: NO GROWTH

## 2018-06-03 LAB — HEMOGLOBIN AND HEMATOCRIT, BLOOD
HCT: 40.5 % (ref 39.0–52.0)
Hemoglobin: 12.5 g/dL — ABNORMAL LOW (ref 13.0–17.0)

## 2018-06-03 LAB — GLUCOSE, CAPILLARY
Glucose-Capillary: 232 mg/dL — ABNORMAL HIGH (ref 70–99)
Glucose-Capillary: 216 mg/dL — ABNORMAL HIGH (ref 70–99)
Glucose-Capillary: 222 mg/dL — ABNORMAL HIGH (ref 70–99)

## 2018-06-03 MED ORDER — CLOTRIMAZOLE 1 % EX CREA: 1.0000 "application " | TOPICAL_CREAM | Freq: Two times a day (BID) | CUTANEOUS | 0 refills | Status: DC

## 2018-06-03 MED ORDER — DOXYCYCLINE HYCLATE 100 MG PO TBEC: 100.0000 mg | DELAYED_RELEASE_TABLET | Freq: Two times a day (BID) | ORAL | 0 refills | Status: AC

## 2018-06-03 MED ORDER — TORSEMIDE 20 MG PO TABS: 20.0000 mg | ORAL_TABLET | Freq: Every day | ORAL | Status: DC

## 2018-06-03 MED ORDER — METOLAZONE 10 MG PO TABS: 10.0000 mg | ORAL_TABLET | Freq: Every day | ORAL | 3 refills | Status: DC

## 2018-06-03 NOTE — Discharge Summary (Signed)
[facility] [place] NAME: Russell [name]    MR#:  [id number]  DATE OF BIRTH:  1965-07-20  DATE OF ADMISSION:  06/01/2018 ADMITTING PHYSICIAN: Harrie Foreman, MD  DATE OF DISCHARGE: 06/03/2018  PRIMARY CARE PHYSICIAN: Kirk Ruths, MD    ADMISSION DIAGNOSIS:  Melena [K92.1] Rectal bleeding [K62.5] Cellulitis of left foot [G28.366] Cellulitis of left leg [Q94.765]  DISCHARGE DIAGNOSIS:  Active Problems:   Cellulitis   SECONDARY DIAGNOSIS:   Past Medical History:  Diagnosis Date  . CHF (congestive heart failure) (HCC)    EF 30-35%  . Depression   . Diabetes mellitus without complication ([place])   . Hyperlipemia   . Hypertension     HOSPITAL COURSE:   53 year old male with past medical history of diabetes, hypertension, hyperlipidemia, obesity, depression, peripheral vascular disease who presented to the hospital due to left leg swelling and redness with a left foot ulcer.  1.  Left lower extremity cellulitis with ulcer- patient presented to the hospital with worsening swelling and redness of his left foot and left leg.  Admitted to the hospital and treated with IV antibiotics for suspected cellulitis with Clindamycin, Zosyn.  Patient no fever, elevated white cell count. - Patient's lower extremities are swollen secondary to likely chronic venous stasis and patient has a stasis ulcer on his left foot.  Wound care team consult was obtained and local wound care applied. - Patient has been clinically afebrile and stable with a normal white cell count.  He will currently be discharged on oral doxycycline for 10-day course along with home health nursing to help with his complex wound care to his left foot ulcer.  2.  GI bleed- patient had a couple episodes of rectal bleeding while in the hospital.  His hemoglobin remained stable.  Patient underwent a CT scan of the abdomen pelvis which was negative for acute pathology.  Patient's  bleeding is likely hemorrhoidal.  This can be further followed as an outpatient.  3.  CHF-this is chronic diastolic CHF. - Patient will continue his Coreg, lisinopril, metolazone, torsemide  4.  Diabetes type 2 with peripheral vascular disease- patient will resume his regular insulin.  5.  Diabetic neuropathy-patient will continue his Lyrica.  6.  Depression-patient will continue his Effexor and Cymbalta.  DISCHARGE CONDITIONS:   Stable  CONSULTS OBTAINED:    DRUG ALLERGIES:   Allergies  Allergen Reactions  . Fluoxetine     Other reaction(s): Hallucination  . Shellfish Allergy Hives  . Sulfa Antibiotics Hives  . Vancomycin     Other reaction(s): Red Man Syndrome  . Metformin Nausea Only    DISCHARGE MEDICATIONS:   Allergies as of 06/03/2018      Reactions   Fluoxetine    Other reaction(s): Hallucination   Shellfish Allergy Hives   Sulfa Antibiotics Hives   Vancomycin    Other reaction(s): Red Man Syndrome   Metformin Nausea Only      Medication List    TAKE these medications   atorvastatin 40 MG tablet Commonly known as:  LIPITOR Take 40 mg by mouth daily.   azelastine 0.05 % ophthalmic solution Commonly known as:  OPTIVAR Place 1 drop into both eyes 2 (two) times daily.   carvedilol 6.25 MG tablet Commonly known as:  COREG Take 6.25 mg by mouth 2 (two) times daily with a meal.   clotrimazole 1 % cream Commonly known as:  LOTRIMIN Apply 1 application topically 2 (two) times daily.  collagenase ointment Commonly known as:  SANTYL Apply topically daily.   doxycycline 100 MG EC tablet Commonly known as:  DORYX Take 1 tablet (100 mg total) by mouth 2 (two) times daily for 10 days.   DULoxetine 60 MG capsule Commonly known as:  CYMBALTA Take 60 mg by mouth daily.   furosemide 40 MG tablet Commonly known as:  LASIX Take 120 mg by mouth 2 (two) times daily.   insulin regular 100 units/mL injection Commonly known as:  NOVOLIN R,HUMULIN R Inject  80 Units into the skin 3 (three) times daily before meals.   lisinopril 10 MG tablet Commonly known as:  PRINIVIL,ZESTRIL Take 10 mg by mouth daily.   metolazone 10 MG tablet Commonly known as:  ZAROXOLYN Take 1 tablet (10 mg total) by mouth daily.   oxyCODONE 5 MG immediate release tablet Commonly known as:  Oxy IR/ROXICODONE Take 5 mg by mouth every 8 (eight) hours as needed for severe pain.   potassium chloride SA 20 MEQ tablet Commonly known as:  K-DUR,KLOR-CON Take 20 mEq by mouth 2 (two) times daily.   pregabalin 150 MG capsule Commonly known as:  LYRICA Take 150 mg by mouth 3 (three) times daily.   senna-docusate 8.6-50 MG tablet Commonly known as:  Senokot-S Take 2 tablets by mouth 2 (two) times daily.   torsemide 20 MG tablet Commonly known as:  DEMADEX Take 1 tablet (20 mg total) by mouth daily.   venlafaxine XR 75 MG 24 hr capsule Commonly known as:  EFFEXOR-XR Take 75 mg by mouth daily with breakfast.         DISCHARGE INSTRUCTIONS:   DIET:  Cardiac diet and Diabetic diet  DISCHARGE CONDITION:  Stable  ACTIVITY:  Activity as tolerated  OXYGEN:  Home Oxygen: No.   Oxygen Delivery: room air  DISCHARGE LOCATION:  Home with home health nursing  If you experience worsening of your admission symptoms, develop shortness of breath, life threatening emergency, suicidal or homicidal thoughts you must seek medical attention immediately by calling 911 or calling your MD immediately  if symptoms less severe.  You Must read complete instructions/literature along with all the possible adverse reactions/side effects for all the Medicines you take and that have been prescribed to you. Take any new Medicines after you have completely understood and accpet all the possible adverse reactions/side effects.   Please note  You were cared for by a hospitalist during your hospital stay. If you have any questions about your discharge medications or the care you  received while you were in the hospital after you are discharged, you can call the unit and asked to speak with the hospitalist on call if the hospitalist that took care of you is not available. Once you are discharged, your primary care physician will handle any further medical issues. Please note that NO REFILLS for any discharge medications will be authorized once you are discharged, as it is imperative that you return to your primary care physician (or establish a relationship with a primary care physician if you do not have one) for your aftercare needs so that they can reassess your need for medications and monitor your lab values.     Today   Had a small bloody stool today.  Hemoglobin is stable.  On exam patient noted to have external hemorrhoids.  No fever, chills, white cell count normal, hemodynamically stable.  VITAL SIGNS:  Blood pressure 131/76, pulse 66, temperature 97.9 F (36.6 C), temperature source Oral, resp. rate  18, height 5\' 11"  (1.803 m), weight (!) 181.4 kg, SpO2 100 %.  I/O:    Intake/Output Summary (Last 24 hours) at 06/03/2018 1109 Last data filed at 06/03/2018 0409 Gross per 24 hour  Intake 1953.57 ml  Output 2300 ml  Net -346.43 ml    PHYSICAL EXAMINATION:  GENERAL:  53 y.o.-year-old obese patient lying in the bed with no acute distress.  EYES: Pupils equal, round, reactive to light and accommodation. No scleral icterus. Extraocular muscles intact.  HEENT: Head atraumatic, normocephalic. Oropharynx and nasopharynx clear.  NECK:  Supple, no jugular venous distention. No thyroid enlargement, no tenderness.  LUNGS: Normal breath sounds bilaterally, no wheezing, rales,rhonchi. No use of accessory muscles of respiration.  CARDIOVASCULAR: S1, S2 normal. No murmurs, rubs, or gallops.  ABDOMEN: Soft, non-tender, non-distended. Bowel sounds present. No organomegaly or mass.  EXTREMITIES: No pedal edema, cyanosis, or clubbing.  Lower extremity edema and skin changes  consistent with chronic venous stasis. 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 No focal motor or sensory defecits b/l.  PSYCHIATRIC: The patient is alert and oriented x 3. Good affect.  SKIN: No obvious rash, lesion, left foot ulcer as shown below    DATA REVIEW:   CBC Recent Labs  Lab 06/03/18 0014 06/03/18 0320  WBC 7.7  --   HGB 12.4* 12.5*  HCT 39.7 40.5  PLT 221  --     Chemistries  Recent Labs  Lab 06/01/18 2359 06/03/18 0014  NA 136 136  K 3.7 3.8  CL 97* 98  CO2 27 29  GLUCOSE 264* 297*  BUN 37* 26*  CREATININE 1.31* 1.05  CALCIUM 8.4* 8.3*  AST 15  --   ALT 14  --   ALKPHOS 105  --   BILITOT 0.6  --     Cardiac Enzymes No results for input(s): TROPONINI in the last 168 hours.  Microbiology Results  Results for orders placed or performed during the hospital encounter of 06/01/18  Blood Culture (routine x 2)     Status: None (Preliminary result)   Collection Time: 06/01/18 11:44 PM  Result Value Ref Range Status   Specimen Description BLOOD RIGHT ANTECUBITAL  Final   Special Requests   Final    BOTTLES DRAWN AEROBIC AND ANAEROBIC Blood Culture adequate volume   Culture   Final    NO GROWTH 1 DAY Performed at [facility], [address], [place], [place] [address]    Report Status PENDING  Incomplete  Blood Culture (routine x 2)     Status: None (Preliminary result)   Collection Time: 06/01/18 11:59 PM  Result Value Ref Range Status   Specimen Description BLOOD BLOOD RIGHT HAND  Final   Special Requests   Final    BOTTLES DRAWN AEROBIC AND ANAEROBIC Blood Culture results may not be optimal due to an excessive volume of blood received in culture bottles   Culture   Final    NO GROWTH 1 DAY Performed at [facility], [address], [place], [place] [address]    Report Status PENDING  Incomplete  MRSA PCR Screening     Status: Abnormal   Collection Time: 06/02/18  4:28 AM   Result Value Ref Range Status   MRSA by PCR POSITIVE (A) NEGATIVE Final    Comment:        The GeneXpert MRSA Assay (FDA approved for NASAL specimens only), is one component of a comprehensive MRSA colonization  surveillance program. It is not intended to diagnose MRSA infection nor to guide or monitor treatment for MRSA infections. RESULT CALLED TO, READ BACK BY AND VERIFIED WITH:  Sula Rumple AT 6568 06/02/18 [place] Performed at [facility], [address] [place], [place] [address]     RADIOLOGY:  Ct Abdomen Pelvis Wo Contrast  Result Date: 06/02/2018 CLINICAL DATA:  Abdominal pain EXAM: CT ABDOMEN AND PELVIS WITHOUT CONTRAST TECHNIQUE: Multidetector CT imaging of the abdomen and pelvis was performed following the standard protocol without IV contrast. COMPARISON:  None. FINDINGS: Lower chest: Lung bases are clear. No effusions. Heart is normal size. Hepatobiliary: No focal hepatic abnormality. Gallbladder unremarkable. Pancreas: Fatty replacement. No focal abnormality or ductal dilatation. Spleen: No focal abnormality.  Normal size. Adrenals/Urinary Tract: No adrenal abnormality. No focal renal abnormality. No stones or hydronephrosis. Urinary bladder is unremarkable. Stomach/Bowel: Prior appendectomy. Stomach, large and small bowel grossly unremarkable. Vascular/Lymphatic: No evidence of aneurysm or adenopathy. Reproductive: No visible focal abnormality. Other: No free fluid or free air. Small umbilical hernia containing fat. Musculoskeletal: No acute bony abnormality. IMPRESSION: No acute findings in the abdomen or pelvis. Electronically Signed   By: Rolm Baptise M.D.   On: 06/02/2018 09:12   Dg Chest Port 1 View  Result Date: 06/02/2018 CLINICAL DATA:  Rectal bleeding, abdominal pain EXAM: PORTABLE CHEST 1 VIEW COMPARISON:  05/18/2017 FINDINGS: Lungs are essentially clear. Mild left basilar scarring, chronic. No pleural effusion or pneumothorax. The heart is top-normal in size.  IMPRESSION: No evidence of acute cardiopulmonary disease. Electronically Signed   By: Julian Hy M.D.   On: 06/02/2018 00:20      Management plans discussed with the patient, family and they are in agreement.  CODE STATUS:     Code Status Orders  (From admission, onward)         Start     Ordered   06/02/18 0346  Full code  Continuous     06/02/18 0345          TOTAL TIME TAKING CARE OF THIS PATIENT: 40 minutes.    Henreitta Leber M.D on 06/03/2018 at 11:09 AM  Between 7am to 6pm - Pager - [phone]  After 6pm go to [email or web address] - [employer] [facility]  Office  [phone]  CC: Primary care physician; Kirk Ruths, MD

## 2018-06-03 NOTE — Progress Notes (Signed)
Patient stable. Instructions, wound care, and rx's given to patient with verbalized understanding.

## 2018-06-03 NOTE — TOC Transition Note (Signed)
Transition of Care [facility]) - CM/SW Discharge Note   Patient Details  Name: Russell [name] MRN: [id number] Date of Birth: 1965/04/19  [facility] CM/SW Contact:  Latanya Maudlin, RN Phone Number: 06/03/2018, 11:01 AM   Clinical Narrative:   Patient to be discharged per MD order. Orders in place for home health services. Patient has a wound that requires daily dressing changes. Patient has amputation and uses wheelchair for transfers at baseline. Reports he needs no DME or therapy services. CMS Medicare.gov Compare Post Acute Care list reviewed with patient and [facility] has used [facility] in the past. Referral placed with [facility] with [facility] Family to transport.      Final next level of care: Home w Home Health Services Barriers to Discharge: No Barriers Identified   Patient Goals and CMS Choice   CMS Medicare.gov Compare Post Acute Care list provided to:: Patient Choice offered to / list presented to : Patient  Discharge Placement                       Discharge Plan and Services     Post Acute Care Choice: Home Health              HH Arranged: RN [facility] Agency: [facility]   Social Determinants of Health (SDOH) Interventions     Readmission Risk Interventions No flowsheet data found.

## 2018-06-07 LAB — CULTURE, BLOOD (ROUTINE X 2)
Culture: NO GROWTH
Culture: NO GROWTH
Special Requests: ADEQUATE

## 2018-06-14 DIAGNOSIS — N184 Chronic kidney disease, stage 4 (severe): Secondary | ICD-10-CM | POA: Insufficient documentation

## 2018-06-14 DIAGNOSIS — I872 Venous insufficiency (chronic) (peripheral): Secondary | ICD-10-CM | POA: Insufficient documentation

## 2018-06-19 IMAGING — CR DG TIBIA/FIBULA 2V*L*
4 series · 4 of 4 positions shown · non-contrast
Comparison: None.

CLINICAL DATA: Left lower leg swelling for 2-3 days. No known
injury.

EXAM:
LEFT TIBIA AND FIBULA - 2 VIEW

[tibia ap (1 of 2)]
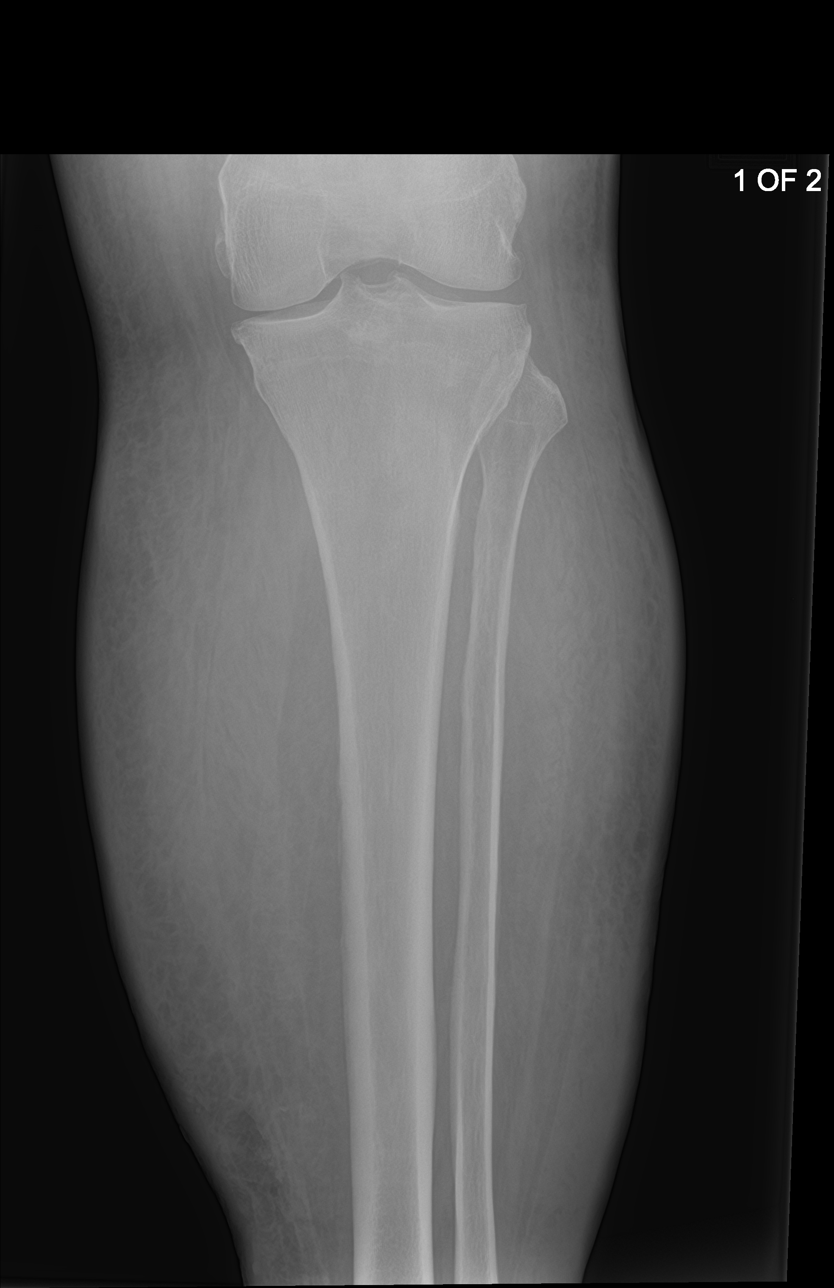

[tibia ap (2 of 2)]
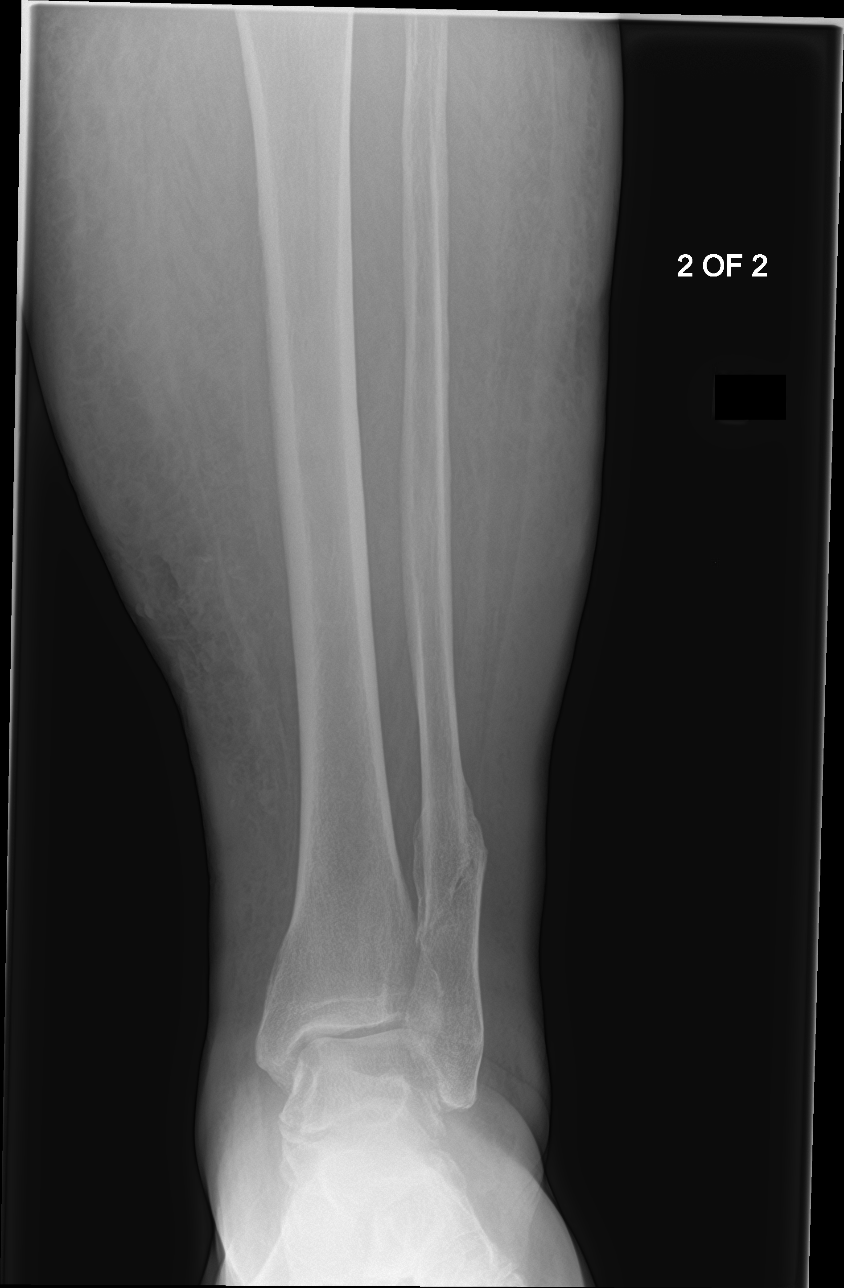

[tibia lat (1 of 2)]
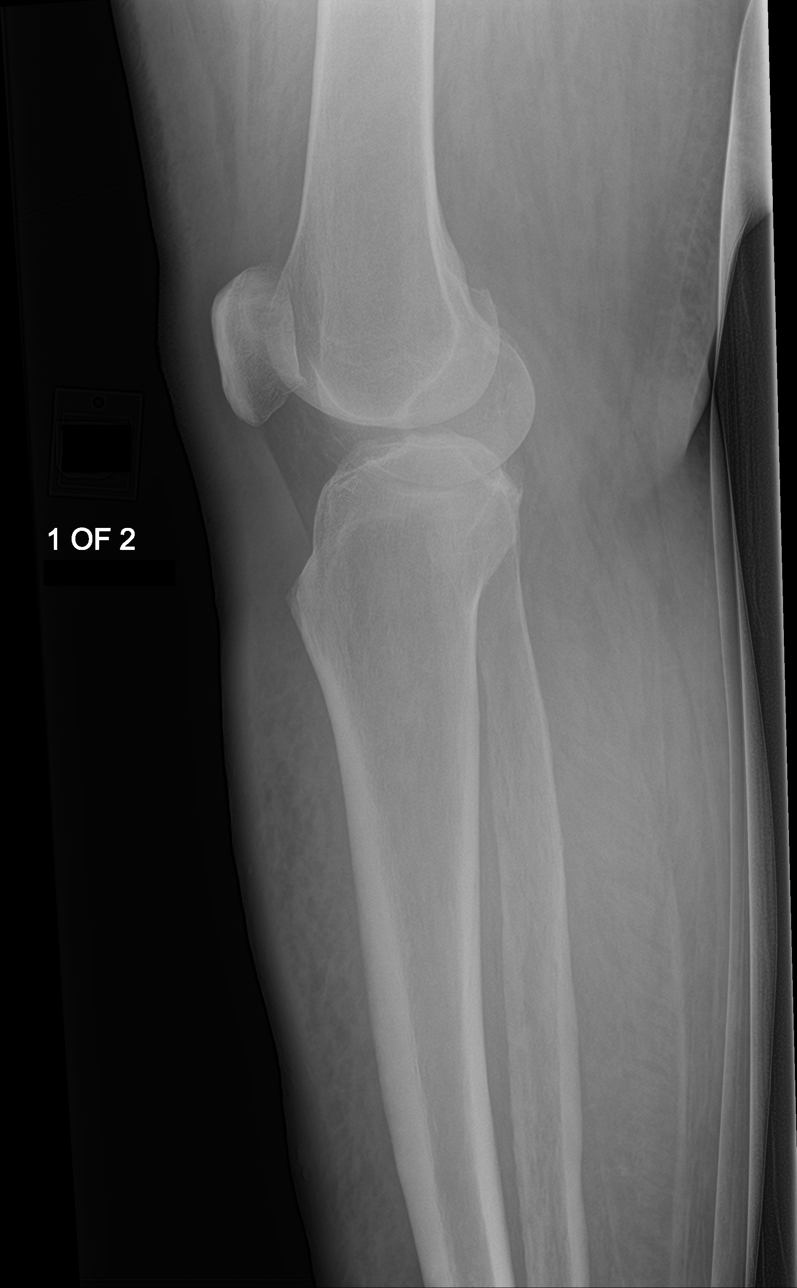

[tibia lat (2 of 2)]
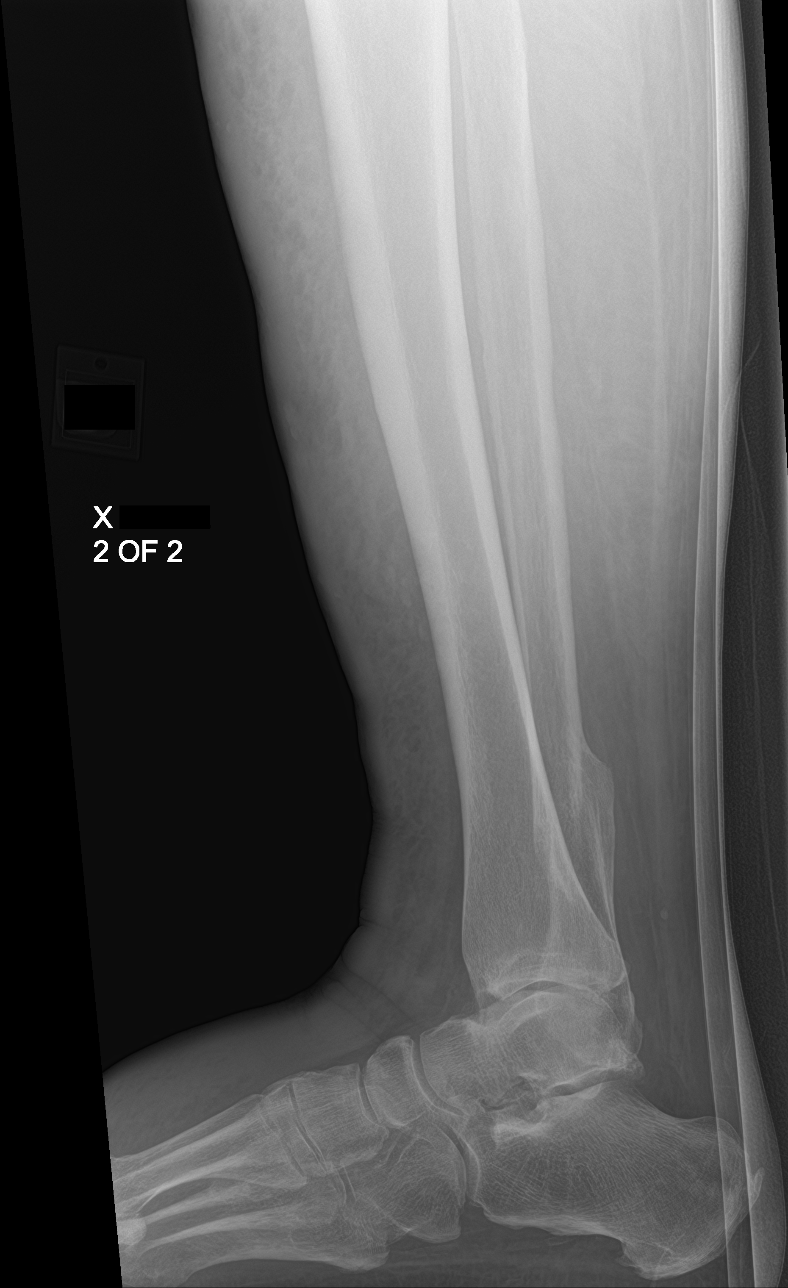

[4 of 4 positions shown; findings below may reference images not displayed]

FINDINGS: There is no acute bony or joint abnormality. Remote healed fracture
of the distal diaphysis of the fibula is noted. Soft tissues of the
lower leg and imaged foot are swollen. No soft tissue gas collection
or radiopaque foreign body.
IMPRESSION: Diffuse lower leg and foot soft tissue swelling consistent with
dependent change or cellulitis.

Remote healed distal fibular fracture.

## 2019-06-13 ENCOUNTER — Ambulatory Visit: Payer: Self-pay | Admitting: Surgery

## 2019-06-21 ENCOUNTER — Encounter: Payer: Self-pay | Admitting: *Deleted

## 2019-07-02 ENCOUNTER — Ambulatory Visit: Payer: Self-pay | Admitting: Surgery

## 2019-07-04 ENCOUNTER — Ambulatory Visit (INDEPENDENT_AMBULATORY_CARE_PROVIDER_SITE_OTHER): Payer: Medicare Other | Admitting: Surgery

## 2019-07-04 ENCOUNTER — Encounter: Payer: Self-pay | Admitting: Surgery

## 2019-07-04 ENCOUNTER — Other Ambulatory Visit: Payer: Self-pay

## 2019-07-04 VITALS — BP 138/87 | HR 98 | Temp 97.9°F | Resp 14 | Ht 71.0 in | Wt >= 6400 oz

## 2019-07-04 DIAGNOSIS — K648 Other hemorrhoids: Secondary | ICD-10-CM | POA: Diagnosis not present

## 2019-07-04 MED ORDER — LIDOCAINE 5 % EX OINT
1.0000 | TOPICAL_OINTMENT | CUTANEOUS | 0 refills | Status: DC | PRN
Start: 2019-07-04 — End: 2022-10-19

## 2019-07-04 MED ORDER — HYDROCORTISONE (PERIANAL) 2.5 % EX CREA: 1.0000 "application " | TOPICAL_CREAM | Freq: Two times a day (BID) | CUTANEOUS | 0 refills | Status: DC

## 2019-07-04 NOTE — Progress Notes (Signed)
07/05/2019  Reason for Visit: Bleeding hemorrhoids  Referring Provider:  Johny Drilling, MD  History of Present Illness: Russell [name] is a 54 y.o. male presenting for evaluation of bleeding hemorrhoids.  He reports he has been dealing with hemorrhoids for a very long time.  He has issues with constipation and goes on the extremes from diarrhea to constipation because of laxatives vs anti-diarrheals.  He had tried fiber initially but that did not help with his bowel movements and has resorted to magnesium citrate, followed by imodium for diarrhea.  He reports pain at the anal canal as well intermittent bleeding.  He feels that he has to constantly to go to have a bowel movement, and when it's finally done, it's time for the next meal and the same issue happens.  He has tried Preparation H and pads.  He even tried to create his own version of a water enema using his shower head and a [place] baster type of tool.  He is morbidly obese and he has a referral for bariatric surgery consult.  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  Past Medical History: Past Medical History:  Diagnosis Date  . Anxiety   . CHF (congestive heart failure) (HCC)    EF 30-35%  . Chronic diastolic (congestive) heart failure ([place])   . Depression   . Diabetes mellitus without complication ([place])   . Hyperlipemia   . Hypertension   . Morbid obesity ([place])   . Peripheral edema      Past Surgical History: Past Surgical History:  Procedure Laterality Date  . APPENDECTOMY    . BELOW KNEE LEG AMPUTATION     Right leg  . TONSILLECTOMY      Home Medications: Prior to Admission  medications   Medication Sig Start Date End Date Taking? Authorizing Provider  atorvastatin (LIPITOR) 40 MG tablet Take 40 mg by mouth daily.    Yes [provider]  azelastine (OPTIVAR) 0.05 % ophthalmic solution Place 1 drop into both eyes 2 (two) times daily.   Yes [provider]  carvedilol (COREG) 6.25 MG tablet Take 6.25 mg by mouth 2 (two) times daily with a meal.   Yes [provider]  clotrimazole (LOTRIMIN) 1 % cream Apply 1 application topically 2 (two) times daily. 06/03/18  Yes Henreitta Leber, MD  collagenase (SANTYL) ointment Apply topically daily. 02/02/18  Yes Mody, Ulice Bold, MD  Dulaglutide (TRULICITY) 1.5 YS/0.6TK SOPN Inject into the skin.   Yes [provider]  DULoxetine (CYMBALTA) 60 MG capsule Take 60 mg by mouth daily. 11/08/17  Yes [provider]  furosemide (LASIX) 40 MG tablet Take 40 mg by mouth 2 (two) times daily. Takes 3 tablets by mouth 2 times a day   Yes [provider]  HYDROcodone-acetaminophen (NORCO/VICODIN) 5-325 MG tablet Take 1 tablet by mouth every 8 (eight) hours.   Yes [provider]  insulin degludec (TRESIBA) 200 UNIT/ML FlexTouch Pen Inject 20 Units into the skin daily.   Yes [provider]  insulin regular (NOVOLIN R,HUMULIN R) 100 units/mL injection Inject 80 Units into the skin 3 (three) times daily before meals.    Yes [provider]  lisinopril (PRINIVIL,ZESTRIL) 10 MG tablet Take 10 mg by mouth daily.   Yes [provider]  oxyCODONE (OXY IR/ROXICODONE) 5 MG immediate release tablet Take 5 mg by mouth every 8 (eight) hours as needed for severe pain.   Yes [provider]  potassium chloride SA (K-DUR,KLOR-CON) 20 MEQ tablet Take 20 mEq by mouth 2 (two) times daily. 11/08/17  Yes [provider]  pregabalin (LYRICA) 150 MG capsule Take 150 mg by mouth 3 (three) times daily.    Yes [provider]  senna-docusate (SENOKOT-S) 8.6-50 MG  tablet Take 2 tablets by mouth 2 (two) times daily.   Yes [provider]  torsemide (DEMADEX) 20 MG tablet Take 1 tablet (20 mg total) by mouth daily. 06/03/18   Henreitta Leber, MD    Allergies: Allergies  Allergen Reactions  . Fluoxetine     Other reaction(s): Hallucination  . Shellfish Allergy Hives  . Sulfa Antibiotics Hives  . Vancomycin     Other reaction(s): Red Man Syndrome  . Metformin Nausea Only    Social History:  reports that he has never smoked. He has never used smokeless tobacco. He reports that he does not drink alcohol or use drugs.   Family History: Family History  Problem Relation Age of Onset  . COPD Mother   . Diabetes Father   . CAD Father   . Kidney cancer Brother     Review of Systems: Review of Systems  Constitutional: Negative for chills and fever.  HENT: Negative for hearing loss.   Respiratory: Positive for shortness of breath.   Cardiovascular: Positive for leg swelling. Negative for chest pain.  Gastrointestinal: Positive for blood in stool, constipation and diarrhea. Negative for abdominal pain, nausea and vomiting.  Genitourinary: Negative for dysuria.  Musculoskeletal: Negative for myalgias.  Skin: Negative for rash.  Neurological: Negative for dizziness.  Psychiatric/Behavioral: Negative for depression.    Physical Exam BP 138/87   Pulse 98   Temp 97.9 F (36.6 C)   Resp 14   Ht 5\' 11"  (1.803 m)   Wt (!) 409 lb (185.5 kg)   SpO2 90%   BMI 57.04 kg/m  CONSTITUTIONAL: No acute distress HEENT:  Normocephalic, atraumatic, extraocular motion intact. NECK: Trachea is midline, and there is no jugular venous distension.  RESPIRATORY:  Lungs are clear, and breath sounds are equal bilaterally. Normal respiratory effort without pathologic use of accessory muscles. CARDIOVASCULAR: Heart is regular without murmurs, gallops, or rubs. GI: The abdomen is soft, obese, non-distended, non-tender.  RECTAL:  External exam reveals  normal external hemorrhoidal tissue without thrombosis or induration.  No fissures. On digital exam, I am able to palpate somewhat enlarged internal hemorrhoids, and on anoscopy, there is no active bleeding and the internal hemorrhoidal columns are bulky but not protruding. MUSCULOSKELETAL:  S/p right BKA, wearing prosthesis.  Has edema of the left leg with changes of venous insufficiency. SKIN: Skin turgor is normal. There are no pathologic skin lesions.  NEUROLOGIC:  Motor and sensation is grossly normal.  Cranial nerves are grossly intact. PSYCH:  Alert and oriented to person, place and time. Affect is normal.  Laboratory Analysis: No results found for this or any previous visit (from the past 24 hour(s)).  Imaging: No results found.  Assessment and Plan: This is a 54 y.o. male with internal hemorrhoidal issues.  Discussed with the patient the different treatment strategies for internal hemorrhoids.  I think first we need to break the cycle of constipation and diarrhea  and reset the clock.  Recommended that he start Metamucil or Benefiber once daily, followed by MiraLax once or twice daily in order to keep a soft bowel movement once or twice daily.  If needed, can add Dulcolax or Senna.  The goal is to avoid the extremes of constipation or diarrhea, as both of these can worsen the hemorrhoids.  The next step is to do Sitz baths twice daily. Given his right BKA, he cannot get onto a tub, but he uses a shower chair and a shower head.  Advised that he can use warm water and spray it over the anal area, and this can help soothe the hemorrhoidal tissue.  Also discussed with him the use for Anusol and Lidocaine ointments.  The goal would be to calm any inflammation from this flareup as well as help with pain control, if any pain.    He will do this for the next month, and he will follow up in one month to check on his progress.    Face-to-face time spent with the patient and care providers was 60  minutes, with more than 50% of the time spent counseling, educating, and coordinating care of the patient.     Melvyn Neth, [facility]

## 2019-07-04 NOTE — Patient Instructions (Addendum)
Try using a Fiber supplement every day, like Metamucil or Benefiber. Make sure you are drinking plenty of water.  Also take Miralax 1-2 doses daily, adjust as needed.  May also use Senokot or Duloclax if needed.  You should have 1-2 soft bowel movements a day, this is your goal.   Avoid Magnesium Citrate unless you truly need it.   Use warm water sprays to the area several times a day if able.   We have sent in some Hydrocortisone cream for you to use. You may use this daily.   We have also sent in a prescription for Lidocaine ointment to be used when you are having a lot of pain, this is a numbing ointment.  Follow up here in 1 month.   High-Fiber Diet Fiber, also called dietary fiber, is a type of carbohydrate that is found in fruits, vegetables, whole grains, and beans. A high-fiber diet can have many health benefits. Your health care provider may recommend a high-fiber diet to help:  Prevent constipation. Fiber can make your bowel movements more regular.  Lower your cholesterol.  Relieve the following conditions: ? Swelling of veins in the anus (hemorrhoids). ? Swelling and irritation (inflammation) of specific areas of the digestive tract (uncomplicated diverticulosis). ? A problem of the large intestine (colon) that sometimes causes pain and diarrhea (irritable bowel syndrome, IBS).  Prevent overeating as part of a weight-loss plan.  Prevent heart disease, type 2 diabetes, and certain cancers. What is my plan? The recommended daily fiber intake in grams (g) includes:  38 g for men age 30 or younger.  30 g for men over age 28.  26 g for women age 67 or younger.  21 g for women over age 3. You can get the recommended daily intake of dietary fiber by:  Eating a variety of fruits, vegetables, grains, and beans.  Taking a fiber supplement, if it is not possible to get enough fiber through your diet. What do I need to know about a high-fiber diet?  It is better to get  fiber through food sources rather than from fiber supplements. There is not a lot of research about how effective supplements are.  Always check the fiber content on the nutrition facts label of any prepackaged food. Look for foods that contain 5 g of fiber or more per serving.  Talk with a diet and nutrition specialist (dietitian) if you have questions about specific foods that are recommended or not recommended for your medical condition, especially if those foods are not listed below.  Gradually increase how much fiber you consume. If you increase your intake of dietary fiber too quickly, you may have bloating, cramping, or gas.  Drink plenty of water. Water helps you to digest fiber. What are tips for following this plan?  Eat a wide variety of high-fiber foods.  Make sure that half of the grains that you eat each day are whole grains.  Eat breads and cereals that are made with whole-grain flour instead of refined flour or white flour.  Eat brown rice, bulgur wheat, or millet instead of white rice.  Start the day with a breakfast that is high in fiber, such as a cereal that contains 5 g of fiber or more per serving.  Use beans in place of meat in soups, salads, and pasta dishes.  Eat high-fiber snacks, such as berries, raw vegetables, nuts, and popcorn.  Choose whole fruits and vegetables instead of processed forms like juice or sauce. What  foods can I eat?  Fruits Berries. Pears. Apples. Oranges. Avocado. Prunes and raisins. Dried figs. Vegetables Sweet potatoes. Spinach. Kale. Artichokes. Cabbage. Broccoli. Cauliflower. Green peas. Carrots. Squash. Grains Whole-grain breads. Multigrain cereal. Oats and oatmeal. Brown rice. Barley. Bulgur wheat. [place]. Quinoa. Bran muffins. Popcorn. Rye wafer crackers. Meats and other proteins Navy, kidney, and pinto beans. Soybeans. Split peas. Lentils. Nuts and seeds. Dairy Fiber-fortified yogurt. Beverages Fiber-fortified soy milk.  Fiber-fortified orange juice. Other foods Fiber bars. The items listed above may not be a complete list of recommended foods and beverages. Contact a dietitian for more options. What foods are not recommended? Fruits Fruit juice. Cooked, strained fruit. Vegetables Fried potatoes. Canned vegetables. Well-cooked vegetables. Grains White bread. Pasta made with refined flour. White rice. Meats and other proteins Fatty cuts of meat. Fried chicken or fried fish. Dairy Milk. Yogurt. Cream cheese. Sour cream. Fats and oils Butters. Beverages Soft drinks. Other foods Cakes and pastries. The items listed above may not be a complete list of foods and beverages to avoid. Contact a dietitian for more information. Summary  Fiber is a type of carbohydrate. It is found in fruits, vegetables, whole grains, and beans.  There are many health benefits of eating a high-fiber diet, such as preventing constipation, lowering blood cholesterol, helping with weight loss, and reducing your risk of heart disease, diabetes, and certain cancers.  Gradually increase your intake of fiber. Increasing too fast can result in cramping, bloating, and gas. Drink plenty of water while you increase your fiber.  The best sources of fiber include whole fruits and vegetables, whole grains, nuts, seeds, and beans. This information is not intended to replace advice given to you by your health care provider. Make sure you discuss any questions you have with your health care provider. Document Revised: 12/20/2016 Document Reviewed: 12/20/2016 Elsevier Patient Education  2020 [employer].

## 2019-07-05 ENCOUNTER — Encounter: Payer: Self-pay | Admitting: Surgery

## 2019-07-10 ENCOUNTER — Telehealth: Payer: Self-pay | Admitting: Family

## 2019-07-10 NOTE — Telephone Encounter (Signed)
Called and LVM several times regarding scheduling a new patient CHF Appointment after we received a referral from [facility]. Patient finally returned call and proceeded to tell me he would not be able to start doing this appointment with me and hung up.   Alyse Low, [place]

## 2019-08-03 ENCOUNTER — Ambulatory Visit: Payer: Medicare Other | Admitting: Surgery

## 2020-03-27 NOTE — Progress Notes (Addendum)
CRAMER, MCNERNEY ([id number]) Visit Report for 03/28/2020 Allergy List Details Patient Name: Russell [name], Russell [name] Date of Service: 03/28/2020 1:00 PM Medical Record Number: [id number] Patient Account Number: [id number] Date of Birth/Sex: [birth year] (55 y.o. M) Treating RN: Cornell Barman Primary Care Rosanne Wohlfarth: Tomasa Hose Other Clinician: Referring Jeneane Pieczynski: Tomasa Hose Treating Tiarra Anastacio/Extender: Skipper Cliche in Treatment: 0 Allergies Active Allergies shellfish Reaction: hives Type: Food Sulfa (Sulfonamide Antibiotics) Reaction: hives Type: Allergen vancomycin Type: Food fluoxetine Type: Food metformin Reaction: nausea Type: Food Allergy Notes Electronic Signature(s) Signed: 03/31/2020 7:56:30 AM By: Carlene Coria RN Previous Signature: 03/27/2020 2:17:34 PM Version By: Gretta Cool, BSN, RN, CWS, Kim RN, BSN Entered By: Carlene Coria on 03/28/2020 13:35:25 [place], [name] ([id number]) -------------------------------------------------------------------------------- Arrival Information Details Patient Name: Russell [name] Date of Service: 03/28/2020 1:00 PM Medical Record Number: [id number] Patient Account Number: [id number] Date of Birth/Sex: 09-21-1965 (54 y.o. M) Treating RN: Carlene Coria Primary Care Tyleah Loh: Tomasa Hose Other Clinician: Referring Anaisabel Pederson: Tomasa Hose Treating Talissa Apple/Extender: Skipper Cliche in Treatment: 0 Visit Information Patient Arrived: Wheel Chair Arrival Time: 13:12 Accompanied By: self Transfer Assistance: None Patient Identification Verified: Yes Secondary Verification Process Completed: Yes Patient Requires Transmission-Based Precautions: No Patient Has Alerts: No History Since Last Visit All ordered tests and consults were completed: No Added or deleted any medications: No Any new allergies or adverse reactions: No Had a fall or experienced change in activities of daily living that may affect risk of falls: No Signs or symptoms of  abuse/neglect since last visito No Hospitalized since last visit: No Implantable device outside of the clinic excluding cellular tissue based products placed in the center since last visit: No Has Dressing in Place as Prescribed: Yes Electronic Signature(s) Signed: 03/31/2020 7:56:30 AM By: Carlene Coria RN Entered By: Carlene Coria on 03/28/2020 13:13:04 [place], [name] ([id number]) -------------------------------------------------------------------------------- Clinic Level of Care Assessment Details Patient Name: Russell [name] Date of Service: 03/28/2020 1:00 PM Medical Record Number: [id number] Patient Account Number: [id number] Date of Birth/Sex: 1965-07-05 (55 y.o. M) Treating RN: Carlene Coria Primary Care Raylin Winer: Tomasa Hose Other Clinician: Referring Lissy Deuser: Tomasa Hose Treating Gauge Winski/Extender: Skipper Cliche in Treatment: 0 Clinic Level of Care Assessment Items TOOL 1 Quantity Score X - Use when EandM and Procedure is performed on INITIAL visit 1 0 ASSESSMENTS - Nursing Assessment / Reassessment X - General Physical Exam (combine w/ comprehensive assessment (listed just below) when performed on new 1 20 pt. evals) X- 1 25 Comprehensive Assessment (HX, ROS, Risk Assessments, Wounds Hx, etc.) ASSESSMENTS - Wound and Skin Assessment / Reassessment X - Dermatologic / Skin Assessment (not related to wound area) 1 10 ASSESSMENTS - Ostomy and/or Continence Assessment and Care '[]'$  - Incontinence Assessment and Management 0 '[]'$  - 0 Ostomy Care Assessment and Management (repouching, etc.) PROCESS - Coordination of Care X - Simple Patient / Family Education for ongoing care 1 15 '[]'$  - 0 Complex (extensive) Patient / Family Education for ongoing care '[]'$  - 0 Staff obtains Programmer, systems, Records, Test Results / Process Orders '[]'$  - 0 Staff telephones HHA, Nursing Homes / Clarify orders / etc '[]'$  - 0 Routine Transfer to another Facility (non-emergent condition) '[]'$  - 0 Routine  Hospital Admission (non-emergent condition) X- 1 15 New Admissions / Biomedical engineer / Ordering NPWT, Apligraf, etc. '[]'$  - 0 Emergency Hospital Admission (emergent condition) PROCESS - Special Needs '[]'$  - Pediatric / Minor Patient Management 0 '[]'$  - 0 Isolation Patient Management '[]'$  - 0 Hearing / Language / Visual special needs '[]'$  -  0 Assessment of Community assistance (transportation, D/C planning, etc.) '[]'$  - 0 Additional assistance / Altered mentation '[]'$  - 0 Support Surface(s) Assessment (bed, cushion, seat, etc.) INTERVENTIONS - Miscellaneous '[]'$  - External ear exam 0 '[]'$  - 0 Patient Transfer (multiple staff / Civil Service fast streamer / Similar devices) '[]'$  - 0 Simple Staple / Suture removal (25 or less) '[]'$  - 0 Complex Staple / Suture removal (26 or more) '[]'$  - 0 Hypo/Hyperglycemic Management (do not check if billed separately) X- 1 15 Ankle / Brachial Index (ABI) - do not check if billed separately Has the patient been seen at the hospital within the last three years: Yes Total Score: 100 Level Of Care: New/Established - Level 3 IVANN, [name] ([id number]) Electronic Signature(s) Signed: 03/31/2020 7:56:30 AM By: Carlene Coria RN Entered By: Carlene Coria on 03/28/2020 14:40:32 [name], [name] ([id number]) -------------------------------------------------------------------------------- Lower Extremity Assessment Details Patient Name: Russell [name] Date of Service: 03/28/2020 1:00 PM Medical Record Number: [id number] Patient Account Number: [id number] Date of Birth/Sex: Jul 17, 1965 (55 y.o. M) Treating RN: Carlene Coria Primary Care Shenique Childers: Tomasa Hose Other Clinician: Referring Kelise Kuch: Tomasa Hose Treating Journey Ratterman/Extender: Skipper Cliche in Treatment: 0 Edema Assessment Assessed: [Left: No] [Right: No] [Left: Edema] [Right: :] Calf Left: Right: Point of Measurement: 40 cm From Medial Instep 44 cm Ankle Left: Right: Point of Measurement: 10 cm From Medial  Instep 32 cm Knee To Floor Left: Right: From Medial Instep 49 cm Vascular Assessment Pulses: Dorsalis Pedis Palpable: [Left:Yes] Blood Pressure: Brachial: [Left:139] Ankle: [Left:Dorsalis Pedis: 150 1.08] Electronic Signature(s) Signed: 03/31/2020 7:56:30 AM By: Carlene Coria RN Entered By: Carlene Coria on 03/28/2020 13:35:11 Lebeau, [name] ([id number]) -------------------------------------------------------------------------------- Multi Wound Chart Details Patient Name: Russell [name] Date of Service: 03/28/2020 1:00 PM Medical Record Number: [id number] Patient Account Number: [id number] Date of Birth/Sex: Jul 24, 1965 (55 y.o. 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 Comorbid History: Sleep Apnea, Arrhythmia, Sleep Apnea, Arrhythmia, Sleep Apnea, Arrhythmia, Congestive Heart Failure, Congestive Heart Failure, Congestive Heart Failure, Hypertension, Type II Diabetes, Hypertension, Type II Diabetes, Hypertension, Type II Diabetes, Dementia Dementia Dementia Date Acquired: 01/30/2020 11/30/2019 11/30/2019 Weeks of Treatment: 0 0 0 Wound Status: Open Open Open Measurements L x W x D (cm) 2x1.3x0.3 4x5.2x0.1 2.3x2.5x0.1 Area (cm) : 2.042 16.336 4.516 Volume (cm) : 0.613 1.634 0.452 Classification: Full Thickness Without Exposed Grade 2 Grade 2 Support Structures Exudate Amount: Small None Present None  Present Exudate Type: Serosanguineous N/A N/A Exudate Color: red, brown N/A N/A Granulation Amount: Small (1-33%) None Present (0%) None Present (0%) Necrotic Amount: Large (67-100%) Large (67-100%) Large (67-100%) Necrotic Tissue: Eschar, Adherent Slough Eschar Eschar Exposed Structures: Fat Layer (Subcutaneous Tissue): Fascia: No Fascia: No Yes Fat Layer (Subcutaneous Tissue): Fat Layer (Subcutaneous Tissue): Fascia: No No No Tendon: No Tendon: No Tendon: No Muscle: No Muscle: No Muscle: No Joint: No Joint: No Joint: No Bone: No Bone: No Bone: No Epithelialization: None None None Wound Number: 8 9 N/A Photos: N/A AMELIO, KALKOWSKI E. 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 of the Lower N/A Extremity Extremity Comorbid History: Sleep Apnea, Arrhythmia, Sleep Apnea, Arrhythmia, N/A Congestive Heart Failure, Congestive Heart Failure, Hypertension, Type II Diabetes, Hypertension, Type II Diabetes, Dementia Dementia Date Acquired: 11/30/2019 03/02/2019 N/A Weeks of Treatment: 0 0 N/A Wound Status: Open Open N/A Measurements L x W x D (cm) 2x1.7x0.1 2.1x2.5x0.1 N/A Area (cm) : 2.67 4.123 N/A Volume (cm) : 0.267 0.412 N/A Classification: Grade 2 Grade 2 N/A Exudate Amount: None Present None Present N/A Exudate Type: N/A N/A N/A Exudate Color: N/A N/A N/A Granulation Amount: None Present (0%) None Present (0%) N/A Necrotic Amount: Large (67-100%) Large (67-100%) N/A Necrotic Tissue: Eschar Eschar N/A Exposed Structures: Fascia: No Fat Layer (Subcutaneous Tissue): N/A Fat Layer (Subcutaneous Tissue): Yes No Fascia: No Tendon: No Tendon: No Muscle: No Muscle: No Joint: No Joint: No Bone: No Bone: No Epithelialization: None None N/A Treatment Notes Electronic Signature(s) Signed: 03/31/2020 7:56:30 AM By: Carlene Coria RN Entered  By: Carlene Coria on 03/28/2020 14:08:51 [place], [name] ([id number]) -------------------------------------------------------------------------------- Multi-Disciplinary Care Plan Details Patient Name: Russell [name] Date of Service: 03/28/2020 1:00 PM Medical Record Number: [id number] Patient Account Number: [id number] Date of Birth/Sex: October 05, 1965 (55 y.o. M) Treating RN: Carlene Coria Primary Care Bertran Zeimet: Tomasa Hose Other Clinician: Referring Earnestine Shipp: Tomasa Hose Treating Asmar Brozek/Extender: Skipper Cliche in Treatment: 0 Active Inactive Nutrition Nursing Diagnoses: Potential for alteratiion in Nutrition/Potential for imbalanced nutrition Goals: Patient/caregiver will maintain therapeutic glucose control Date Initiated: 03/28/2020 Target Resolution Date: 04/28/2020 Goal Status: Active Interventions: Assess HgA1c results as ordered upon admission and as needed Assess patient nutrition upon admission and as needed per policy Notes: Wound/Skin Impairment Nursing Diagnoses: Knowledge deficit related to ulceration/compromised skin integrity Goals: Patient/caregiver will verbalize understanding of skin care regimen Date Initiated: 03/28/2020 Target Resolution Date: 04/28/2020 Goal Status: Active Ulcer/skin breakdown will have a volume reduction of 30% by week 4 Date Initiated: 03/28/2020 Target Resolution Date: 04/28/2020 Goal Status: Active Interventions: Assess patient/caregiver ability to obtain necessary supplies Assess patient/caregiver ability to perform ulcer/skin care regimen upon admission and as needed Assess ulceration(s) every visit Notes: Electronic Signature(s) Signed: 03/31/2020 7:56:30 AM By: Carlene Coria RN Entered By: Carlene Coria on 03/28/2020 14:08:22 [place], [name] ([id number]) -------------------------------------------------------------------------------- Pain Assessment Details Patient Name: Russell [name] Date of Service: 03/28/2020 1:00  PM Medical Record Number: [id number] Patient Account Number: [id number] Date of Birth/Sex: 11/27/1965 (55 y.o. M) Treating RN: Carlene Coria Primary Care Penina Reisner: Tomasa Hose Other Clinician: Referring Sally Menard: Tomasa Hose Treating Destaney Sarkis/Extender: Skipper Cliche in Treatment: 0 Active Problems Location of Pain Severity and Description of Pain Patient Has Paino Yes Site Locations With Dressing Change: Yes Duration of the Pain. Constant / Intermittento Constant Rate the pain. Current Pain Level: 6 Worst Pain Level: 9 Least Pain Level: 2 Tolerable Pain Level: 5 Character of Pain Describe the Pain: Shooting, Stabbing Pain Management and Medication Current Pain Management: Medication: No Cold Application: No Rest: Yes Massage: No Activity: No T.E.N.S.: No Heat Application: No Leg drop or elevation: No Is the Current Pain Management Adequate: Inadequate How does your wound impact your activities of daily livingo Sleep: Yes Bathing: No Appetite: No Relationship With Others: No Bladder Continence: No Emotions: No Bowel Continence: No Work: No Toileting: No Drive: No Dressing: No Hobbies: No Electronic Signature(s) Signed: 03/31/2020 7:56:30 AM By: Carlene Coria RN Entered By: Carlene Coria on 03/28/2020 13:14:16 [place], [name] ([id number]) -------------------------------------------------------------------------------- Patient/Caregiver Education Details Patient Name:  Russell [name] Date of Service: 03/28/2020 1:00 PM Medical Record Number: [id number] Patient Account Number: [id number] Date of Birth/Gender: [birth year] (55 y.o. M) Treating RN: Carlene Coria Primary Care Physician: Tomasa Hose Other Clinician: Referring Physician: Tomasa Hose Treating Physician/Extender: Skipper Cliche in Treatment: 0 Education Assessment Education Provided To: Patient Education Topics Provided Wound/Skin Impairment: Methods: Explain/Verbal Responses: State content  correctly Electronic Signature(s) Signed: 03/31/2020 7:56:30 AM By: Carlene Coria RN Entered By: Carlene Coria on 03/28/2020 14:40:52 [place], [name] ([id number]) -------------------------------------------------------------------------------- Wound Assessment Details Patient Name: Russell [name] Date of Service: 03/28/2020 1:00 PM Medical Record Number: [id number] Patient Account Number: [id number] Date of Birth/Sex: 23-Jun-1965 (55 y.o. M) Treating RN: Carlene Coria Primary Care Jenna Routzahn: Tomasa Hose Other Clinician: Referring Kambra Beachem: Tomasa Hose Treating Sierah Lacewell/Extender: Skipper Cliche in Treatment: 0 Wound Status Wound Number: 10 Primary 3rd degree Burn Etiology: Wound Location: Left Hand - 3rd Digit Wound Open Wounding Event: Thermal Burn Status: Date Acquired: 01/30/2020 Comorbid Sleep Apnea, Arrhythmia, Congestive Heart Failure, Weeks Of Treatment: 0 History: Hypertension, Type II Diabetes, Dementia Clustered Wound: No Photos Wound Measurements Length: (cm) 2 Width: (cm) 1.3 Depth: (cm) 0.3 Area: (cm) 2.042 Volume: (cm) 0.613 % Reduction in Area: % Reduction in Volume: Epithelialization: None Tunneling: No Undermining: No Wound Description Classification: Full Thickness Without Exposed Support Structu Exudate Amount: Small Exudate Type: Serosanguineous Exudate Color: red, brown res Foul Odor After Cleansing: No Slough/Fibrino Yes Wound Bed Granulation Amount: Small (1-33%) Exposed Structure Necrotic Amount: Large (67-100%) Fascia Exposed: No Necrotic Quality: Eschar, Adherent Slough Fat Layer (Subcutaneous Tissue) Exposed: Yes Tendon Exposed: No Muscle Exposed: No Joint Exposed: No Bone Exposed: No Electronic Signature(s) Signed: 03/31/2020 7:56:30 AM By: Carlene Coria RN Entered By: Carlene Coria on 03/28/2020 13:49:40 [place], [name] ([id number]) -------------------------------------------------------------------------------- Wound Assessment  Details Patient Name: Russell [name] Date of Service: 03/28/2020 1:00 PM Medical Record Number: [id number] Patient Account Number: [id number] Date of Birth/Sex: January 01, 1966 (55 y.o. M) Treating RN: Carlene Coria Primary Care Alexandra Posadas: Tomasa Hose Other Clinician: Referring Wallice Granville: Tomasa Hose Treating Neola Worrall/Extender: Skipper Cliche in Treatment: 0 Wound Status Wound Number: 6 Primary Diabetic Wound/Ulcer of the Lower Extremity Etiology: Wound Location: Left Lower Leg Wound Open Wounding Event: Gradually Appeared Status: Date Acquired: 11/30/2019 Comorbid Sleep Apnea, Arrhythmia, Congestive Heart Failure, Weeks Of Treatment: 0 History: Hypertension, Type II Diabetes, Dementia Clustered Wound: No Photos Wound Measurements Length: (cm) 4 Width: (cm) 5.2 Depth: (cm) 0.1 Area: (cm) 16.336 Volume: (cm) 1.634 % Reduction in Area: % Reduction in Volume: Epithelialization: None Tunneling: No Undermining: No Wound Description Classification: Grade 2 Exudate Amount: None Present Foul Odor After Cleansing: No Slough/Fibrino Yes Wound Bed Granulation Amount: None Present (0%) Exposed Structure Necrotic Amount: Large (67-100%) Fascia Exposed: No Necrotic Quality: Eschar Fat Layer (Subcutaneous Tissue) Exposed: No Tendon Exposed: No Muscle Exposed: No Joint Exposed: No Bone Exposed: No Electronic Signature(s) Signed: 03/31/2020 7:56:30 AM By: Carlene Coria RN Entered By: Carlene Coria on 03/28/2020 13:42:58 [place], [name] ([id number]) -------------------------------------------------------------------------------- Wound Assessment Details Patient Name: Russell [name] Date of Service: 03/28/2020 1:00 PM Medical Record Number: [id number] Patient Account Number: [id number] Date of Birth/Sex: Jul 26, 1965 (55 y.o. M) Treating RN: Carlene Coria Primary Care Daemyn Gariepy: Tomasa Hose Other Clinician: Referring Naasia Weilbacher: Tomasa Hose Treating Darryel Diodato/Extender: Skipper Cliche in Treatment: 0 Wound Status Wound Number: 7 Primary Diabetic Wound/Ulcer of the Lower Extremity Etiology: Wound Location: Left, Distal, Anterior Lower Leg Wound Open Wounding Event: Blister Status: Date Acquired: 11/30/2019 Comorbid Sleep Apnea, Arrhythmia, Congestive  Heart Failure, Weeks Of Treatment: 0 History: Hypertension, Type II Diabetes, Dementia Clustered Wound: No Photos Wound Measurements Length: (cm) 2.3 Width: (cm) 2.5 Depth: (cm) 0.1 Area: (cm) 4.516 Volume: (cm) 0.452 % Reduction in Area: % Reduction in Volume: Epithelialization: None Tunneling: No Undermining: No Wound Description Classification: Grade 2 Exudate Amount: None Present Foul Odor After Cleansing: No Slough/Fibrino Yes Wound Bed Granulation Amount: None Present (0%) Exposed Structure Necrotic Amount: Large (67-100%) Fascia Exposed: No Necrotic Quality: Eschar Fat Layer (Subcutaneous Tissue) Exposed: No Tendon Exposed: No Muscle Exposed: No Joint Exposed: No Bone Exposed: No Electronic Signature(s) Signed: 03/31/2020 7:56:30 AM By: Carlene Coria RN Entered By: Carlene Coria on 03/28/2020 13:44:57 [place]erman, [name] ([id number]) -------------------------------------------------------------------------------- Wound Assessment Details Patient Name: Russell [name] Date of Service: 03/28/2020 1:00 PM Medical Record Number: [id number] Patient Account Number: [id number] Date of Birth/Sex: May 05, 1965 (55 y.o. M) Treating RN: Carlene Coria Primary Care Bay Jarquin: Tomasa Hose Other Clinician: Referring Chanse Kagel: Tomasa Hose Treating Lani Havlik/Extender: Skipper Cliche in Treatment: 0 Wound Status Wound Number: 8 Primary Diabetic Wound/Ulcer of the Lower Extremity Etiology: Wound Location: Left, Medial Lower Leg Wound Open Wounding Event: Blister Status: Date Acquired: 11/30/2019 Comorbid Sleep Apnea, Arrhythmia, Congestive Heart Failure, Weeks Of Treatment: 0 History:  Hypertension, Type II Diabetes, Dementia Clustered Wound: No Photos Wound Measurements Length: (cm) 2 Width: (cm) 1.7 Depth: (cm) 0.1 Area: (cm) 2.67 Volume: (cm) 0.267 % Reduction in Area: % Reduction in Volume: Epithelialization: None Tunneling: No Undermining: No Wound Description Classification: Grade 2 Exudate Amount: None Present Foul Odor After Cleansing: No Slough/Fibrino Yes Wound Bed Granulation Amount: None Present (0%) Exposed Structure Necrotic Amount: Large (67-100%) Fascia Exposed: No Necrotic Quality: Eschar Fat Layer (Subcutaneous Tissue) Exposed: No Tendon Exposed: No Muscle Exposed: No Joint Exposed: No Bone Exposed: No Electronic Signature(s) Signed: 03/31/2020 7:56:30 AM By: Carlene Coria RN Entered By: Carlene Coria on 03/28/2020 13:46:16 Vincent, [name] ([id number]) -------------------------------------------------------------------------------- Wound Assessment Details Patient Name: Russell [name] Date of Service: 03/28/2020 1:00 PM Medical Record Number: [id number] Patient Account Number: [id number] Date of Birth/Sex: 09-01-1965 (55 y.o. M) Treating RN: Carlene Coria Primary Care Reino Lybbert: Tomasa Hose Other Clinician: Referring Rea Kalama: Tomasa Hose Treating Meaghann Choo/Extender: Skipper Cliche in Treatment: 0 Wound Status Wound Number: 9 Primary Diabetic Wound/Ulcer of the Lower Extremity Etiology: Wound Location: Dorsal Foot Wound Open Wounding Event: Blister Status: Date Acquired: 03/02/2019 Comorbid Sleep Apnea, Arrhythmia, Congestive Heart Failure, Weeks Of Treatment: 0 History: Hypertension, Type II Diabetes, Dementia Clustered Wound: No Photos Wound Measurements Length: (cm) 2.1 Width: (cm) 2.5 Depth: (cm) 0.1 Area: (cm) 4.123 Volume: (cm) 0.412 % Reduction in Area: % Reduction in Volume: Epithelialization: None Tunneling: No Undermining: No Wound Description Classification: Grade 2 Exudate Amount: None  Present Foul Odor After Cleansing: No Slough/Fibrino Yes Wound Bed Granulation Amount: None Present (0%) Exposed Structure Necrotic Amount: Large (67-100%) Fascia Exposed: No Necrotic Quality: Eschar Fat Layer (Subcutaneous Tissue) Exposed: Yes Tendon Exposed: No Muscle Exposed: No Joint Exposed: No Bone Exposed: No Electronic Signature(s) Signed: 03/31/2020 7:56:30 AM By: Carlene Coria RN Entered By: Carlene Coria on 03/28/2020 13:47:27 [place], [name] ([id number]) -------------------------------------------------------------------------------- Maurice Details Patient Name: Russell [name] Date of Service: 03/28/2020 1:00 PM Medical Record Number: [id number] Patient Account Number: [id number] Date of Birth/Sex: 1965-08-27 (55 y.o. M) Treating RN: Carlene Coria Primary Care Janise Gora: Tomasa Hose Other Clinician: Referring Sherwin Hollingshed: Tomasa Hose Treating Samir Ishaq/Extender: Skipper Cliche in Treatment: 0 Vital Signs Time Taken: 13:14 Temperature (F): 98.3 Height (in): 71 Pulse (bpm):  65 Source: Stated Respiratory Rate (breaths/min): 20 Weight (lbs): 403 Blood Pressure (mmHg): 139/66 Body Mass Index (BMI): 56.2 Reference Range: 80 - 120 mg / dl Electronic Signature(s) Signed: 03/31/2020 7:56:30 AM By: Carlene Coria RN Entered By: Carlene Coria on 03/28/2020 13:15:01

## 2020-03-28 ENCOUNTER — Other Ambulatory Visit: Payer: Self-pay

## 2020-03-28 ENCOUNTER — Encounter: Payer: Medicare Other | Attending: Physician Assistant | Admitting: Physician Assistant

## 2020-03-28 DIAGNOSIS — Z881 Allergy status to other antibiotic agents status: Secondary | ICD-10-CM | POA: Insufficient documentation

## 2020-03-28 DIAGNOSIS — Z6841 Body Mass Index (BMI) 40.0 and over, adult: Secondary | ICD-10-CM | POA: Insufficient documentation

## 2020-03-28 DIAGNOSIS — X58XXXA Exposure to other specified factors, initial encounter: Secondary | ICD-10-CM | POA: Insufficient documentation

## 2020-03-28 DIAGNOSIS — Z882 Allergy status to sulfonamides status: Secondary | ICD-10-CM | POA: Diagnosis not present

## 2020-03-28 DIAGNOSIS — Z8249 Family history of ischemic heart disease and other diseases of the circulatory system: Secondary | ICD-10-CM | POA: Insufficient documentation

## 2020-03-28 DIAGNOSIS — L97822 Non-pressure chronic ulcer of other part of left lower leg with fat layer exposed: Secondary | ICD-10-CM | POA: Insufficient documentation

## 2020-03-28 DIAGNOSIS — Z833 Family history of diabetes mellitus: Secondary | ICD-10-CM | POA: Diagnosis not present

## 2020-03-28 DIAGNOSIS — I480 Paroxysmal atrial fibrillation: Secondary | ICD-10-CM | POA: Diagnosis not present

## 2020-03-28 DIAGNOSIS — Z794 Long term (current) use of insulin: Secondary | ICD-10-CM | POA: Insufficient documentation

## 2020-03-28 DIAGNOSIS — F0390 Unspecified dementia without behavioral disturbance: Secondary | ICD-10-CM | POA: Insufficient documentation

## 2020-03-28 DIAGNOSIS — E10622 Type 1 diabetes mellitus with other skin ulcer: Secondary | ICD-10-CM | POA: Insufficient documentation

## 2020-03-28 DIAGNOSIS — L97522 Non-pressure chronic ulcer of other part of left foot with fat layer exposed: Secondary | ICD-10-CM | POA: Insufficient documentation

## 2020-03-28 DIAGNOSIS — I509 Heart failure, unspecified: Secondary | ICD-10-CM | POA: Insufficient documentation

## 2020-03-28 DIAGNOSIS — I110 Hypertensive heart disease with heart failure: Secondary | ICD-10-CM | POA: Insufficient documentation

## 2020-03-28 DIAGNOSIS — Z888 Allergy status to other drugs, medicaments and biological substances status: Secondary | ICD-10-CM | POA: Diagnosis not present

## 2020-03-28 DIAGNOSIS — T23302A Burn of third degree of left hand, unspecified site, initial encounter: Secondary | ICD-10-CM | POA: Diagnosis not present

## 2020-03-28 DIAGNOSIS — E10621 Type 1 diabetes mellitus with foot ulcer: Secondary | ICD-10-CM | POA: Diagnosis present
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
## 2020-03-31 NOTE — Progress Notes (Signed)
BAUER, MALINSKY ([id number]) Visit Report for 03/28/2020 Abuse/Suicide Risk Screen Details Patient Name: Russell [name], Russell [name] Date of Service: 03/28/2020 1:00 PM Medical Record Number: [id number] Patient Account Number: [id number] Date of Birth/Sex: 11/15/1965 (55 y.o. M) Treating RN: Carlene Coria Primary Care Karolyne Timmons: Tomasa Hose Other Clinician: Referring Willene Holian: Tomasa Hose Treating Cyndel Griffey/Extender: Skipper Cliche in Treatment: 0 Abuse/Suicide Risk Screen Items Answer ABUSE RISK SCREEN: Has anyone close to you tried to hurt or harm you recentlyo No Do you feel uncomfortable with anyone in your familyo No Has anyone forced you do things that you didnot want to doo No Electronic Signature(s) Signed: 03/31/2020 7:56:30 AM By: Carlene Coria RN Entered By: Carlene Coria on 03/28/2020 13:37:17 [place], [name] ([id number]) -------------------------------------------------------------------------------- Activities of Daily Living Details Patient Name: Russell [name] Date of Service: 03/28/2020 1:00 PM Medical Record Number: [id number] Patient Account Number: [id number] Date of Birth/Sex: [birth year] (55 y.o. M) Treating RN: Carlene Coria Primary Care Britteney Ayotte: Tomasa Hose Other Clinician: Referring Lollie Gunner: Tomasa Hose Treating Teal Raben/Extender: Skipper Cliche in Treatment: 0 Activities of Daily Living Items Answer Activities of Daily Living (Please select one for each item) Drive Automobile Not Able Take Medications Completely Able Use Telephone Completely Able Care for Appearance Need Assistance Use Toilet Need Assistance Bath / Shower Need Assistance Dress Self Need Assistance Feed Self Completely Able Walk Not Able Get In / Out Bed Need Assistance Housework Not Able Prepare Meals Not Able Handle Money Not Able Shop for Self Not Able Electronic Signature(s) Signed: 03/31/2020 7:56:30 AM By: Carlene Coria RN Entered By: Carlene Coria on 03/28/2020 13:37:51 Guarn[place],  [name] ([id number]) -------------------------------------------------------------------------------- Education Screening Details Patient Name: Russell [name] Date of Service: 03/28/2020 1:00 PM Medical Record Number: [id number] Patient Account Number: [id number] Date of Birth/Sex: 14-Mar-1965 (55 y.o. M) Treating RN: Carlene Coria Primary Care Charnae Lill: Tomasa Hose Other Clinician: Referring Sidi Dzikowski: Tomasa Hose Treating Hanne Kegg/Extender: Skipper Cliche in Treatment: 0 Learning Preferences/Education Level/Primary Language Learning Preference: Explanation Highest Education Level: High School Preferred Language: English Cognitive Barrier Language Barrier: No Translator Needed: No Memory Deficit: No Emotional Barrier: No Cultural/Religious Beliefs Affecting Medical Care: No Physical Barrier Impaired Vision: No Impaired Hearing: No Decreased Hand dexterity: No Knowledge/Comprehension Knowledge Level: Medium Comprehension Level: Medium Ability to understand written instructions: Medium Ability to understand verbal instructions: Medium Motivation Anxiety Level: Anxious Cooperation: Cooperative Education Importance: Acknowledges Need Interest in Health Problems: Asks Questions Perception: Coherent Willingness to Engage in Self-Management Medium Activities: Readiness to Engage in Self-Management Medium Activities: Electronic Signature(s) Signed: 03/31/2020 7:56:30 AM By: Carlene Coria RN Entered By: Carlene Coria on 03/28/2020 13:38:16 [place]and[place]ice, [name] ([id number]) -------------------------------------------------------------------------------- Fall Risk Assessment Details Patient Name: Russell [name] Date of Service: 03/28/2020 1:00 PM Medical Record Number: [id number] Patient Account Number: [id number] Date of Birth/Sex: 1965/11/26 (55 y.o. M) Treating RN: Carlene Coria Primary Care Lathyn Griggs: Tomasa Hose Other Clinician: Referring Deleah Tison: Tomasa Hose Treating  Aldrin Engelhard/Extender: Skipper Cliche in Treatment: 0 Fall Risk Assessment Items Have you had 2 or more falls in the last 12 monthso 0 No Have you had any fall that resulted in injury in the last 12 monthso 0 No FALLS RISK SCREEN History of falling - immediate or within 3 months 0 No Secondary diagnosis (Do you have 2 or more medical diagnoseso) 0 No Ambulatory aid None/bed rest/wheelchair/nurse 0 No Crutches/cane/walker 0 No Furniture 0 No Intravenous therapy Access/Saline/Heparin Lock 0 No Gait/Transferring Normal/ bed rest/ wheelchair 0 No Weak (short steps with or without shuffle, stooped but  able to lift head while walking, may 0 No seek support from furniture) Impaired (short steps with shuffle, may have difficulty arising from chair, head down, impaired 0 No balance) Mental Status Oriented to own ability 0 No Electronic Signature(s) Signed: 03/31/2020 7:56:30 AM By: Carlene Coria RN Entered By: Carlene Coria on 03/28/2020 13:38:22 [place], [name] ([id number]) -------------------------------------------------------------------------------- Foot Assessment Details Patient Name: Russell [name] Date of Service: 03/28/2020 1:00 PM Medical Record Number: [id number] Patient Account Number: [id number] Date of Birth/Sex: April 04, 1965 (55 y.o. M) Treating RN: Carlene Coria Primary Care Adysson Revelle: Tomasa Hose Other Clinician: Referring Marrietta Thunder: Tomasa Hose Treating Kentrail Shew/Extender: Skipper Cliche in Treatment: 0 Foot Assessment Items Site Locations + = Sensation present, - = Sensation absent, C = Callus, U = Ulcer R = Redness, W = Warmth, M = Maceration, PU = Pre-ulcerative lesion F = Fissure, S = Swelling, D = Dryness Assessment Right: Left: Other Deformity: No No Prior Foot Ulcer: Yes No Prior Amputation: Yes No Charcot Joint: No No Ambulatory Status: Non-ambulatory Assistance Device: Wheelchair Gait: Administrator, arts) Signed: 03/31/2020 7:56:30 AM By:  Carlene Coria RN Entered By: Carlene Coria on 03/28/2020 13:40:12 [place], [name] ([id number]) -------------------------------------------------------------------------------- Nutrition Risk Screening Details Patient Name: Russell [name] Date of Service: 03/28/2020 1:00 PM Medical Record Number: [id number] Patient Account Number: [id number] Date of Birth/Sex: [birth year] (55 y.o. M) Treating RN: Carlene Coria Primary Care Nykeem Citro: Tomasa Hose Other Clinician: Referring Dajiah Kooi: Tomasa Hose Treating Mohmmad Saleeby/Extender: Skipper Cliche in Treatment: 0 Height (in): 71 Weight (lbs): 403 Body Mass Index (BMI): 56.2 Nutrition Risk Screening Items Score Screening NUTRITION RISK SCREEN: I have an illness or condition that made me change the kind and/or amount of food I eat 0 No I eat fewer than two meals per day 0 No I eat few fruits and vegetables, or milk products 0 No I have three or more drinks of beer, liquor or wine almost every day 0 No I have tooth or mouth problems that make it hard for me to eat 0 No I don't always have enough money to buy the food I need 0 No I eat alone most of the time 0 No I take three or more different prescribed or over-the-counter drugs a day 1 Yes Without wanting to, I have lost or gained 10 pounds in the last six months 0 No I am not always physically able to shop, cook and/or feed myself 0 No Nutrition Protocols Good Risk Protocol 0 No interventions needed Moderate Risk Protocol High Risk Proctocol Risk Level: Good Risk Score: 1 Electronic Signature(s) Signed: 03/31/2020 7:56:30 AM By: Carlene Coria RN Entered By: Carlene Coria on 03/28/2020 13:38:33

## 2020-04-04 ENCOUNTER — Encounter: Payer: Medicare Other | Attending: Physician Assistant | Admitting: Physician Assistant

## 2020-04-04 ENCOUNTER — Other Ambulatory Visit: Payer: Self-pay

## 2020-04-04 DIAGNOSIS — L97822 Non-pressure chronic ulcer of other part of left lower leg with fat layer exposed: Secondary | ICD-10-CM | POA: Diagnosis not present

## 2020-04-04 DIAGNOSIS — E10622 Type 1 diabetes mellitus with other skin ulcer: Secondary | ICD-10-CM | POA: Diagnosis not present

## 2020-04-04 DIAGNOSIS — L97522 Non-pressure chronic ulcer of other part of left foot with fat layer exposed: Secondary | ICD-10-CM | POA: Insufficient documentation

## 2020-04-04 DIAGNOSIS — E10621 Type 1 diabetes mellitus with foot ulcer: Secondary | ICD-10-CM | POA: Insufficient documentation

## 2020-04-04 DIAGNOSIS — X100XXA Contact with hot drinks, initial encounter: Secondary | ICD-10-CM | POA: Insufficient documentation

## 2020-04-04 DIAGNOSIS — T23322A Burn of third degree of single left finger (nail) except thumb, initial encounter: Secondary | ICD-10-CM | POA: Diagnosis not present
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
## 2020-04-10 ENCOUNTER — Ambulatory Visit: Payer: Medicare Other | Admitting: Physician Assistant

## 2020-04-17 ENCOUNTER — Other Ambulatory Visit: Payer: Self-pay

## 2020-04-17 ENCOUNTER — Encounter: Payer: Medicare Other | Admitting: Physician Assistant

## 2020-04-17 DIAGNOSIS — E10621 Type 1 diabetes mellitus with foot ulcer: Secondary | ICD-10-CM | POA: Diagnosis not present
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
## 2020-04-17 NOTE — Progress Notes (Addendum)
Russell [name] ([id number]) Visit Report for 04/17/2020 Arrival Information Details Patient Name: Russell [name] Date of Service: 04/17/2020 3:30 PM Medical Record Number: [id number] Patient Account Number: [id number] Date of Birth/Sex: October 21, 1965 (55 y.o. M) Treating RN: Carlene Coria Primary Care : Tomasa Hose Other Clinician: Referring : Tomasa Hose Treating /Extender: Skipper Cliche in Treatment: 2 Visit Information History Since Last Visit All ordered tests and consults were completed: No Patient Arrived: Wheel Chair Added or deleted any medications: No Arrival Time: 15:32 Any new allergies or adverse reactions: No Accompanied By: self Had a fall or experienced change in No Transfer Assistance: None activities of daily living that may affect Patient Identification Verified: Yes risk of falls: Secondary Verification Process Completed: Yes Signs or symptoms of abuse/neglect since last visito No Patient Requires Transmission-Based Precautions: No Hospitalized since last visit: No Patient Has Alerts: No Implantable device outside of the clinic excluding No cellular tissue based products placed in the center since last visit: Has Dressing in Place as Prescribed: No Has Compression in Place as Prescribed: No Pain Present Now: No Electronic Signature(s) Signed: 04/21/2020 4:57:18 PM By: Carlene Coria RN Entered By: Carlene Coria on 04/17/2020 15:37:41 Russell [name] ([id number]) -------------------------------------------------------------------------------- Clinic Level of Care Assessment Details Patient Name: Russell [name] Date of Service: 04/17/2020 3:30 PM Medical Record Number: [id number] Patient Account Number: [id number] Date of Birth/Sex: [birth year] (56 y.o. M) Treating RN: Dolan Amen Primary Care : Tomasa Hose Other Clinician: Referring : Tomasa Hose Treating /Extender: Skipper Cliche in Treatment:  2 Clinic Level of Care Assessment Items TOOL 1 Quantity Score '[]'$  - Use when EandM and Procedure is performed on INITIAL visit 0 ASSESSMENTS - Nursing Assessment / Reassessment '[]'$  - General Physical Exam (combine w/ comprehensive assessment (listed just below) when performed on new 0 pt. evals) '[]'$  - 0 Comprehensive Assessment (HX, ROS, Risk Assessments, Wounds Hx, etc.) ASSESSMENTS - Wound and Skin Assessment / Reassessment '[]'$  - Dermatologic / Skin Assessment (not related to wound area) 0 ASSESSMENTS - Ostomy and/or Continence Assessment and Care '[]'$  - Incontinence Assessment and Management 0 '[]'$  - 0 Ostomy Care Assessment and Management (repouching, etc.) PROCESS - Coordination of Care '[]'$  - Simple Patient / Family Education for ongoing care 0 '[]'$  - 0 Complex (extensive) Patient / Family Education for ongoing care '[]'$  - 0 Staff obtains Programmer, systems, Records, Test Results / Process Orders '[]'$  - 0 Staff telephones HHA, Nursing Homes / Clarify orders / etc '[]'$  - 0 Routine Transfer to another Facility (non-emergent condition) '[]'$  - 0 Routine Hospital Admission (non-emergent condition) '[]'$  - 0 New Admissions / Biomedical engineer / Ordering NPWT, Apligraf, etc. '[]'$  - 0 Emergency Hospital Admission (emergent condition) PROCESS - Special Needs '[]'$  - Pediatric / Minor Patient Management 0 '[]'$  - 0 Isolation Patient Management '[]'$  - 0 Hearing / Language / Visual special needs '[]'$  - 0 Assessment of Community assistance (transportation, D/C planning, etc.) '[]'$  - 0 Additional assistance / Altered mentation '[]'$  - 0 Support Surface(s) Assessment (bed, cushion, seat, etc.) INTERVENTIONS - Miscellaneous '[]'$  - External ear exam 0 '[]'$  - 0 Patient Transfer (multiple staff / Civil Service fast streamer / Similar devices) '[]'$  - 0 Simple Staple / Suture removal (25 or less) '[]'$  - 0 Complex Staple / Suture removal (26 or more) '[]'$  - 0 Hypo/Hyperglycemic Management (do not check if billed separately) '[]'$  - 0 Ankle /  Brachial Index (ABI) - do not check if billed separately Has the patient been seen at the hospital within the  last three years: Yes Total Score: 0 Level Of Care: ____ Russell [name] ([id number]) Electronic Signature(s) Signed: 04/17/2020 5:25:46 PM By: Georges Mouse, Minus Breeding RN Entered By: Georges Mouse, Minus Breeding on 04/17/2020 16:41:35 Russell [name] ([id number]) -------------------------------------------------------------------------------- Compression Therapy Details Patient Name: Russell [name] Date of Service: 04/17/2020 3:30 PM Medical Record Number: [id number] Patient Account Number: [id number] Date of Birth/Sex: [birth year] (54 y.o. M) Treating RN: Dolan Amen Primary Care : Tomasa Hose Other Clinician: Referring : Tomasa Hose Treating /Extender: Skipper Cliche in Treatment: 2 Compression Therapy Performed for Wound Assessment: Wound #14 Left Calcaneus Performed By: Clinician Dolan Amen, RN Compression Type: Four Layer Pre Treatment ABI: 1.1 Post Procedure Diagnosis Same as Pre-procedure Electronic Signature(s) Signed: 04/17/2020 5:25:46 PM By: Georges Mouse, Minus Breeding RN Entered By: Georges Mouse, Minus Breeding on 04/17/2020 16:43:04 Russell [name] ([id number]) -------------------------------------------------------------------------------- Compression Therapy Details Patient Name: Russell [name] Date of Service: 04/17/2020 3:30 PM Medical Record Number: [id number] Patient Account Number: [id number] Date of Birth/Sex: August 30, 1965 (54 y.o. M) Treating RN: Dolan Amen Primary Care : Tomasa Hose Other Clinician: Referring : Tomasa Hose Treating /Extender: Skipper Cliche in Treatment: 2 Compression Therapy Performed for Wound Assessment: Wound #15 Left Calcaneus Performed By: Clinician Dolan Amen, RN Compression Type: Four Layer Pre Treatment ABI: 1.1 Post Procedure Diagnosis Same as  Pre-procedure Electronic Signature(s) Signed: 04/17/2020 5:25:46 PM By: Georges Mouse, Minus Breeding RN Entered By: Georges Mouse, Minus Breeding on 04/17/2020 16:43:04 Russell [name] ([id number]) -------------------------------------------------------------------------------- Compression Therapy Details Patient Name: Russell [name] Date of Service: 04/17/2020 3:30 PM Medical Record Number: [id number] Patient Account Number: [id number] Date of Birth/Sex: [birth year] (54 y.o. M) Treating RN: Dolan Amen Primary Care : Tomasa Hose Other Clinician: Referring : Tomasa Hose Treating /Extender: Skipper Cliche in Treatment: 2 Compression Therapy Performed for Wound Assessment: Wound #6 Left Lower Leg Performed By: Clinician Dolan Amen, RN Compression Type: Four Layer Pre Treatment ABI: 1.1 Post Procedure Diagnosis Same as Pre-procedure Electronic Signature(s) Signed: 04/17/2020 5:25:46 PM By: Georges Mouse, Minus Breeding RN Entered By: Georges Mouse, Minus Breeding on 04/17/2020 16:43:04 [place], Daleen [name] ([id number]) -------------------------------------------------------------------------------- Compression Therapy Details Patient Name: Russell [name] Date of Service: 04/17/2020 3:30 PM Medical Record Number: [id number] Patient Account Number: [id number] Date of Birth/Sex: May 20, 1965 (54 y.o. M) Treating RN: Dolan Amen Primary Care : Tomasa Hose Other Clinician: Referring : Tomasa Hose Treating /Extender: Skipper Cliche in Treatment: 2 Compression Therapy Performed for Wound Assessment: Wound #9 Dorsal Foot Performed By: Cora Daniels, RN Compression Type: Four Layer Pre Treatment ABI: 1.1 Post Procedure Diagnosis Same as Pre-procedure Electronic Signature(s) Signed: 04/17/2020 5:25:46 PM By: Georges Mouse, Minus Breeding RN Entered By: Georges Mouse, Minus Breeding on 04/17/2020 16:43:04 [place], Daleen [name]  ([id number]) -------------------------------------------------------------------------------- Compression Therapy Details Patient Name: Russell [name] Date of Service: 04/17/2020 3:30 PM Medical Record Number: [id number] Patient Account Number: [id number] Date of Birth/Sex: 09/24/1965 (54 y.o. M) Treating RN: Dolan Amen Primary Care : Tomasa Hose Other Clinician: Referring : Tomasa Hose Treating /Extender: Skipper Cliche in Treatment: 2 Compression Therapy Performed for Wound Assessment: Wound #7 Left,Distal,Anterior Lower Leg Performed By: Cora Daniels, RN Compression Type: Four Layer Pre Treatment ABI: 1.1 Post Procedure Diagnosis Same as Pre-procedure Electronic Signature(s) Signed: 04/17/2020 5:25:46 PM By: Georges Mouse, Minus Breeding RN Entered By: Georges Mouse, Minus Breeding on 04/17/2020 16:43:04 [place], Daleen [name] ([id number]) -------------------------------------------------------------------------------- Compression Therapy Details Patient Name: Russell [name] Date of Service: 04/17/2020 3:30 PM Medical Record Number: [id number] Patient Account Number: [id number] Date of Birth/Sex: 08/01/1965 (54  y.o. M) Treating RN: Dolan Amen Primary Care : Tomasa Hose Other Clinician: Referring : Tomasa Hose Treating /Extender: Skipper Cliche in Treatment: 2 Compression Therapy Performed for Wound Assessment: Wound #8 Left,Medial Lower Leg Performed By: Clinician Dolan Amen, RN Compression Type: Four Layer Pre Treatment ABI: 1.1 Post Procedure Diagnosis Same as Pre-procedure Electronic Signature(s) Signed: 04/17/2020 5:25:46 PM By: Georges Mouse, Minus Breeding RN Entered By: Georges Mouse, Minus Breeding on 04/17/2020 16:43:04 [place], Daleen [name] ([id number]) -------------------------------------------------------------------------------- Encounter Discharge Information Details Patient Name: Russell [name] Date of Service:  04/17/2020 3:30 PM Medical Record Number: [id number] Patient Account Number: [id number] Date of Birth/Sex: Jan 22, 1966 (54 y.o. M) Treating RN: Dolan Amen Primary Care : Tomasa Hose Other Clinician: Referring : Tomasa Hose Treating /Extender: Skipper Cliche in Treatment: 2 Encounter Discharge Information Items Discharge Condition: Stable Ambulatory Status: Wheelchair Discharge Destination: Home Transportation: Private Auto Accompanied By: self Schedule Follow-up Appointment: Yes Clinical Summary of Care: Electronic Signature(s) Signed: 04/17/2020 5:25:46 PM By: Georges Mouse, Minus Breeding RN Entered By: Georges Mouse, Minus Breeding on 04/17/2020 16:44:09 [place], Daleen [name] ([id number]) -------------------------------------------------------------------------------- Lower Extremity Assessment Details Patient Name: Russell [name] Date of Service: 04/17/2020 3:30 PM Medical Record Number: [id number] Patient Account Number: [id number] Date of Birth/Sex: 10/23/1965 (55 y.o. M) Treating RN: Carlene Coria Primary Care : Tomasa Hose Other Clinician: Referring : Tomasa Hose Treating /Extender: Skipper Cliche in Treatment: 2 Edema Assessment Assessed: [Left: No] [Right: No] Edema: [Left: Ye] [Right: s] Calf Left: Right: Point of Measurement: From Medial Instep 49 cm Ankle Left: Right: Point of Measurement: From Medial Instep 31 cm Vascular Assessment Pulses: Dorsalis Pedis Palpable: [Left:Yes] Electronic Signature(s) Signed: 04/21/2020 4:57:18 PM By: Carlene Coria RN Entered By: Carlene Coria on 04/17/2020 16:11:47 [place], Daleen [name] ([id number]) -------------------------------------------------------------------------------- Multi Wound Chart Details Patient Name: Russell [name] Date of Service: 04/17/2020 3:30 PM Medical Record Number: [id number] Patient Account Number: [id number] Date of Birth/Sex: 1965/05/05 (55 y.o. M) Treating RN:  Dolan Amen Primary Care : Tomasa Hose Other Clinician: Referring : Tomasa Hose Treating /Extender: Skipper Cliche in Treatment: 2 Vital Signs Height(in): Pulse(bpm): 74 Weight(lbs): 403 Blood Pressure(mmHg): 102/63 Body Mass Index(BMI): Temperature(F): 98.7 Respiratory Rate(breaths/min): 18 Photos: Wound Location: Left Hand - 3rd Digit Left, Anterior Hand - 3rd Digit Left, Distal Hand - 3rd Digit Wounding Event: Thermal Burn Gradually Appeared Gradually Appeared Primary Etiology: 3rd degree Burn 2nd degree Burn 2nd degree Burn Comorbid History: Sleep Apnea, Arrhythmia, Sleep Apnea, Arrhythmia, Sleep Apnea, Arrhythmia, Congestive Heart Failure, Congestive Heart Failure, Congestive Heart Failure, Hypertension, Type II Diabetes, Hypertension, Type II Diabetes, Hypertension, Type II Diabetes, Dementia Dementia Dementia Date Acquired: 01/30/2020 04/01/2020 04/08/2020 Weeks of Treatment: 2 0 0 Wound Status: Open Open Open Measurements L x W x D (cm) 1x1.7x0.2 0.5x0.7x0.2 1.1x0.6x0.2 Area (cm) : 1.335 0.275 0.518 Volume (cm) : 0.267 0.055 0.104 % Reduction in Area: 34.60% N/A N/A % Reduction in Volume: 56.40% N/A N/A Classification: Full Thickness Without Exposed Full Thickness Without Exposed Full Thickness Without Exposed Support Structures Support Structures Support Structures Exudate Amount: Small Medium Medium Exudate Type: Serosanguineous Serosanguineous Serosanguineous Exudate Color: red, brown red, brown red, brown Granulation Amount: Medium (34-66%) Small (1-33%) Small (1-33%) Granulation Quality: N/A Pink, Pale Pink, Pale Necrotic Amount: Medium (34-66%) Large (67-100%) Large (67-100%) Necrotic Tissue: Adherent [place] Exposed Structures: Fat Layer (Subcutaneous Tissue): Fat Layer (Subcutaneous Tissue): Fat Layer (Subcutaneous Tissue): Yes Yes Yes Fascia: No Fascia: No Fascia: No Tendon: No Tendon:  No Tendon: No Muscle: No Muscle:  No Muscle: No Joint: No Joint: No Joint: No Bone: No Bone: No Bone: No Epithelialization: None None N/A Wound Number: '13 14 15 '$ Photos: ROLLEY, ROSARIO ([id number]) Wound Location: Left Hand - 4th Digit Left Calcaneus Left Calcaneus Wounding Event: Gradually Appeared Gradually Appeared Gradually Appeared 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 Comorbid History: Sleep Apnea, Arrhythmia, Sleep Apnea, Arrhythmia, Sleep Apnea, Arrhythmia, Congestive Heart Failure, Congestive Heart Failure, Congestive Heart Failure, Hypertension, Type II Diabetes, Hypertension, Type II Diabetes, Hypertension, Type II Diabetes, Dementia Dementia Dementia Date Acquired: 04/08/2020 04/08/2020 04/08/2020 Weeks of Treatment: 0 0 0 Wound Status: Open Open Open Measurements L x W x D (cm) 0.6x0.5x0.2 1.5x0.2x0.1 0.5x2x0.2 Area (cm) : 0.236 0.236 0.785 Volume (cm) : 0.047 0.024 0.157 % Reduction in Area: N/A N/A N/A % Reduction in Volume: N/A N/A N/A Classification: Full Thickness Without Exposed Grade 2 Grade 2 Support Structures Exudate Amount: Medium Medium Medium Exudate Type: Serosanguineous Serosanguineous Serosanguineous Exudate Color: red, brown red, brown red, brown Granulation Amount: Medium (34-66%) Small (1-33%) Medium (34-66%) Granulation Quality: Pink, Pale Pink Pink, Pale Necrotic Amount: Medium (34-66%) Large (67-100%) Medium (34-66%) Necrotic Tissue: Adherent [place] Exposed Structures: Fat Layer (Subcutaneous Tissue): Fat Layer (Subcutaneous Tissue): Fat Layer (Subcutaneous Tissue): Yes Yes Yes Fascia: No Fascia: No Fascia: No Tendon: No Tendon: No Tendon: No Muscle: No Muscle: No Muscle: No Joint: No Joint: No Joint: No Bone: No Bone: No Bone: No Epithelialization: None None None Wound Number: '6 7 8 '$ Photos: Wound Location:  Left Lower Leg Left, Distal, Anterior Lower Leg Left, Medial Lower Leg Wounding Event: Gradually Appeared Blister Blister 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 Comorbid History: Sleep Apnea, Arrhythmia, Sleep Apnea, Arrhythmia, Sleep Apnea, Arrhythmia, Congestive Heart Failure, Congestive Heart Failure, Congestive Heart Failure, Hypertension, Type II Diabetes, Hypertension, Type II Diabetes, Hypertension, Type II Diabetes, Dementia Dementia Dementia Date Acquired: 11/30/2019 11/30/2019 11/30/2019 Weeks of Treatment: '2 2 2 '$ Wound Status: Open Open Open Measurements L x W x D (cm) 1.6x1x0.1 3.5x4.5x0.2 2x1.5x0.2 Area (cm) : 1.257 12.37 2.356 Volume (cm) : 0.126 2.474 0.471 % Reduction in Area: 92.30% -173.90% 11.80% % Reduction in Volume: 92.30% -447.30% -76.40% Classification: Grade 2 Grade 2 Grade 2 Exudate Amount: Medium Medium Medium Exudate Type: Serosanguineous Serosanguineous Serosanguineous Exudate Color: red, brown red, brown red, brown Granulation Amount: Small (1-33%) Medium (34-66%) Medium (34-66%) Granulation Quality: Pink Pink Red, Pink Necrotic Amount: Large (67-100%) Medium (34-66%) Medium (34-66%) Necrotic Tissue: Eschar, Adherent [place] Exposed Structures: Fat Layer (Subcutaneous Tissue): Fat Layer (Subcutaneous Tissue): Fat Layer (Subcutaneous Tissue): Yes Yes Yes Fascia: No Fascia: No Fascia: No Tendon: No Tendon: No Tendon: No Millstein, CRISHAWN SEGEL. ([id number]) Muscle: No Muscle: No Muscle: No Joint: No Joint: No Joint: No Bone: No Bone: No Bone: No Epithelialization: None None None Wound Number: 9 N/A N/A Photos: N/A N/A Wound Location: Dorsal Foot N/A N/A Wounding Event: Blister N/A N/A Primary Etiology: Diabetic Wound/Ulcer of the Lower N/A N/A Extremity Comorbid History: Sleep Apnea, Arrhythmia, N/A  N/A Congestive Heart Failure, Hypertension, Type II Diabetes, Dementia Date Acquired: 03/02/2019 N/A N/A Weeks of Treatment: 2 N/A N/A Wound Status: Open N/A N/A Measurements L x W x D (cm) 2.5x2.5x0.3 N/A N/A Area (cm) : 4.909 N/A N/A Volume (cm) : 1.473 N/A N/A % Reduction in Area: -19.10% N/A N/A % Reduction  in Volume: -257.50% N/A N/A Classification: Grade 2 N/A N/A Exudate Amount: None Present N/A N/A Exudate Type: N/A N/A N/A Exudate Color: N/A N/A N/A Granulation Amount: None Present (0%) N/A N/A Granulation Quality: N/A N/A N/A Necrotic Amount: Large (67-100%) N/A N/A Necrotic Tissue: Eschar, Adherent Slough N/A N/A Exposed Structures: Fat Layer (Subcutaneous Tissue): N/A N/A Yes Fascia: No Tendon: No Muscle: No Joint: No Bone: No Epithelialization: None N/A N/A Treatment Notes Electronic Signature(s) Signed: 04/17/2020 5:25:46 PM By: Georges Mouse, Minus Breeding RN Entered By: Georges Mouse, Minus Breeding on 04/17/2020 16:19:39 Grahn, Daleen [name] ([id number]) -------------------------------------------------------------------------------- Multi-Disciplinary Care Plan Details Patient Name: Russell [name] Date of Service: 04/17/2020 3:30 PM Medical Record Number: [id number] Patient Account Number: [id number] Date of Birth/Sex: 01-10-1966 (55 y.o. M) Treating RN: Dolan Amen Primary Care : Tomasa Hose Other Clinician: Referring : Tomasa Hose Treating /Extender: Skipper Cliche in Treatment: 2 Active Inactive Electronic Signature(s) Signed: 05/02/2020 4:58:55 PM By: Gretta Cool, BSN, RN, CWS, Kim RN, BSN Signed: 06/06/2020 11:57:44 AM By: Charlett Nose RN Previous Signature: 04/17/2020 5:25:46 PM Version By: Georges Mouse, Minus Breeding RN Entered By: Gretta Cool, BSN, RN, CWS, Kim on 05/02/2020 16:58:55 Jamar, Daleen [name] ([id number]) -------------------------------------------------------------------------------- Pain Assessment Details Patient Name:  Russell [name] Date of Service: 04/17/2020 3:30 PM Medical Record Number: [id number] Patient Account Number: [id number] Date of Birth/Sex: 1965/03/15 (55 y.o. M) Treating RN: Carlene Coria Primary Care : Tomasa Hose Other Clinician: Referring : Tomasa Hose Treating /Extender: Skipper Cliche in Treatment: 2 Active Problems Location of Pain Severity and Description of Pain Patient Has Paino No Site Locations Pain Management and Medication Current Pain Management: Electronic Signature(s) Signed: 04/21/2020 4:57:18 PM By: Carlene Coria RN Entered By: Carlene Coria on 04/17/2020 15:38:43 [place], Daleen [name] ([id number]) -------------------------------------------------------------------------------- Patient/Caregiver Education Details Patient Name: Russell [name] Date of Service: 04/17/2020 3:30 PM Medical Record Number: [id number] Patient Account Number: [id number] Date of Birth/Gender: 08-Oct-1965 (54 y.o. M) Treating RN: Dolan Amen Primary Care Physician: Tomasa Hose Other Clinician: Referring Physician: Tomasa Hose Treating Physician/Extender: Skipper Cliche in Treatment: 2 Education Assessment Education Provided To: Patient Education Topics Provided Notes Educated on compression therapy wrap Electronic Signature(s) Signed: 04/17/2020 5:25:46 PM By: Georges Mouse, Minus Breeding RN Entered By: Georges Mouse, Minus Breeding on 04/17/2020 16:43:27 [place], Daleen [name] ([id number]) -------------------------------------------------------------------------------- Wound Assessment Details Patient Name: Russell [name] Date of Service: 04/17/2020 3:30 PM Medical Record Number: [id number] Patient Account Number: [id number] Date of Birth/Sex: 1965-06-18 (55 y.o. M) Treating RN: Carlene Coria Primary Care : Tomasa Hose Other Clinician: Referring : Tomasa Hose Treating /Extender: Skipper Cliche in Treatment: 2 Wound Status Wound Number: 10  Primary 3rd degree Burn Etiology: Wound Location: Left Hand - 3rd Digit Wound Open Wounding Event: Thermal Burn Status: Date Acquired: 01/30/2020 Comorbid Sleep Apnea, Arrhythmia, Congestive Heart Failure, Weeks Of Treatment: 2 History: Hypertension, Type II Diabetes, Dementia Clustered Wound: No Photos Wound Measurements Length: (cm) 1 Width: (cm) 1.7 Depth: (cm) 0.2 Area: (cm) 1.335 Volume: (cm) 0.267 % Reduction in Area: 34.6% % Reduction in Volume: 56.4% Epithelialization: None Tunneling: No Undermining: No Wound Description Classification: Full Thickness Without Exposed Support Structu Exudate Amount: Small Exudate Type: Serosanguineous Exudate Color: red, brown res Foul Odor After Cleansing: No Slough/Fibrino Yes Wound Bed Granulation Amount: Medium (34-66%) Exposed Structure Necrotic Amount: Medium (34-66%) Fascia Exposed: No Necrotic Quality: Adherent Slough Fat Layer (Subcutaneous Tissue) Exposed: Yes Tendon Exposed: No Muscle Exposed: No Joint Exposed: No Bone Exposed: No Electronic Signature(s) Signed: 04/21/2020 4:57:18 PM By: Carlene Coria  RN Entered By: Carlene Coria on 04/17/2020 16:02:02 [place], Daleen [name] ([id number]) -------------------------------------------------------------------------------- Wound Assessment Details Patient Name: Russell [name] Date of Service: 04/17/2020 3:30 PM Medical Record Number: [id number] Patient Account Number: [id number] Date of Birth/Sex: Dec 31, 1965 (55 y.o. M) Treating RN: Carlene Coria Primary Care : Tomasa Hose Other Clinician: Referring : Tomasa Hose Treating /Extender: Skipper Cliche in Treatment: 2 Wound Status Wound Number: 11 Primary 2nd degree Burn Etiology: Wound Location: Left, Anterior Hand - 3rd Digit Wound Open Wounding Event: Gradually Appeared Status: Date Acquired: 04/01/2020 Comorbid Sleep Apnea, Arrhythmia, Congestive Heart Failure, Weeks Of Treatment: 0 History:  Hypertension, Type II Diabetes, Dementia Clustered Wound: No Photos Wound Measurements Length: (cm) 0.5 Width: (cm) 0.7 Depth: (cm) 0.2 Area: (cm) 0.275 Volume: (cm) 0.055 % Reduction in Area: % Reduction in Volume: Epithelialization: None Tunneling: No Undermining: No Wound Description Classification: Full Thickness Without Exposed Support Structu Exudate Amount: Medium Exudate Type: Serosanguineous Exudate Color: red, brown res Foul Odor After Cleansing: No Slough/Fibrino Yes Wound Bed Granulation Amount: Small (1-33%) Exposed Structure Granulation Quality: Pink, Pale Fascia Exposed: No Necrotic Amount: Large (67-100%) Fat Layer (Subcutaneous Tissue) Exposed: Yes Necrotic Quality: Adherent Slough Tendon Exposed: No Muscle Exposed: No Joint Exposed: No Bone Exposed: No Electronic Signature(s) Signed: 04/21/2020 4:57:18 PM By: Carlene Coria RN Entered By: Carlene Coria on 04/17/2020 15:56:54 [place], Daleen [name] ([id number]) -------------------------------------------------------------------------------- Wound Assessment Details Patient Name: Russell [name] Date of Service: 04/17/2020 3:30 PM Medical Record Number: [id number] Patient Account Number: [id number] Date of Birth/Sex: Oct 06, 1965 (55 y.o. M) Treating RN: Carlene Coria Primary Care : Tomasa Hose Other Clinician: Referring : Tomasa Hose Treating /Extender: Skipper Cliche in Treatment: 2 Wound Status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

## 2020-04-24 ENCOUNTER — Ambulatory Visit: Payer: Medicare Other | Admitting: Physician Assistant

## 2020-04-29 ENCOUNTER — Ambulatory Visit: Payer: Medicare Other | Admitting: Physician Assistant

## 2020-06-02 ENCOUNTER — Institutional Professional Consult (permissible substitution): Payer: Medicare Other | Admitting: Neurology

## 2020-11-21 ENCOUNTER — Emergency Department: Payer: Medicare Other

## 2020-11-21 ENCOUNTER — Other Ambulatory Visit: Payer: Self-pay

## 2020-11-21 ENCOUNTER — Inpatient Hospital Stay
Admission: EM | Admit: 2020-11-21 | Discharge: 2020-11-22 | DRG: 641 | Discharge status: Against Medical Advice | Payer: Medicare Other | Attending: Internal Medicine | Admitting: Internal Medicine

## 2020-11-21 DIAGNOSIS — E860 Dehydration: Principal | ICD-10-CM

## 2020-11-21 DIAGNOSIS — R739 Hyperglycemia, unspecified: Secondary | ICD-10-CM

## 2020-11-21 DIAGNOSIS — Y92410 Unspecified street and highway as the place of occurrence of the external cause: Secondary | ICD-10-CM | POA: Diagnosis not present

## 2020-11-21 DIAGNOSIS — Z6841 Body Mass Index (BMI) 40.0 and over, adult: Secondary | ICD-10-CM

## 2020-11-21 DIAGNOSIS — Z882 Allergy status to sulfonamides status: Secondary | ICD-10-CM

## 2020-11-21 DIAGNOSIS — Z8249 Family history of ischemic heart disease and other diseases of the circulatory system: Secondary | ICD-10-CM

## 2020-11-21 DIAGNOSIS — I110 Hypertensive heart disease with heart failure: Secondary | ICD-10-CM | POA: Diagnosis present

## 2020-11-21 DIAGNOSIS — Z5329 Procedure and treatment not carried out because of patient's decision for other reasons: Secondary | ICD-10-CM | POA: Diagnosis present

## 2020-11-21 DIAGNOSIS — Z825 Family history of asthma and other chronic lower respiratory diseases: Secondary | ICD-10-CM

## 2020-11-21 DIAGNOSIS — Z881 Allergy status to other antibiotic agents status: Secondary | ICD-10-CM

## 2020-11-21 DIAGNOSIS — E782 Mixed hyperlipidemia: Secondary | ICD-10-CM | POA: Diagnosis present

## 2020-11-21 DIAGNOSIS — E1142 Type 2 diabetes mellitus with diabetic polyneuropathy: Secondary | ICD-10-CM | POA: Diagnosis present

## 2020-11-21 DIAGNOSIS — E1165 Type 2 diabetes mellitus with hyperglycemia: Secondary | ICD-10-CM | POA: Diagnosis present

## 2020-11-21 DIAGNOSIS — Z79899 Other long term (current) drug therapy: Secondary | ICD-10-CM | POA: Diagnosis not present

## 2020-11-21 DIAGNOSIS — I959 Hypotension, unspecified: Secondary | ICD-10-CM | POA: Diagnosis present

## 2020-11-21 DIAGNOSIS — S80812A Abrasion, left lower leg, initial encounter: Secondary | ICD-10-CM | POA: Diagnosis present

## 2020-11-21 DIAGNOSIS — Z20822 Contact with and (suspected) exposure to covid-19: Secondary | ICD-10-CM | POA: Diagnosis present

## 2020-11-21 DIAGNOSIS — W050XXA Fall from non-moving wheelchair, initial encounter: Secondary | ICD-10-CM | POA: Diagnosis present

## 2020-11-21 DIAGNOSIS — Z833 Family history of diabetes mellitus: Secondary | ICD-10-CM

## 2020-11-21 DIAGNOSIS — E872 Acidosis, unspecified: Secondary | ICD-10-CM

## 2020-11-21 DIAGNOSIS — F607 Dependent personality disorder: Secondary | ICD-10-CM | POA: Diagnosis present

## 2020-11-21 DIAGNOSIS — Z888 Allergy status to other drugs, medicaments and biological substances status: Secondary | ICD-10-CM

## 2020-11-21 DIAGNOSIS — E1151 Type 2 diabetes mellitus with diabetic peripheral angiopathy without gangrene: Secondary | ICD-10-CM | POA: Diagnosis present

## 2020-11-21 DIAGNOSIS — I4510 Unspecified right bundle-branch block: Secondary | ICD-10-CM | POA: Diagnosis present

## 2020-11-21 DIAGNOSIS — I5042 Chronic combined systolic (congestive) and diastolic (congestive) heart failure: Secondary | ICD-10-CM | POA: Diagnosis present

## 2020-11-21 DIAGNOSIS — Z8051 Family history of malignant neoplasm of kidney: Secondary | ICD-10-CM

## 2020-11-21 DIAGNOSIS — T1490XA Injury, unspecified, initial encounter: Secondary | ICD-10-CM | POA: Diagnosis present

## 2020-11-21 DIAGNOSIS — Z91013 Allergy to seafood: Secondary | ICD-10-CM

## 2020-11-21 DIAGNOSIS — F32A Depression, unspecified: Secondary | ICD-10-CM | POA: Diagnosis present

## 2020-11-21 DIAGNOSIS — I4892 Unspecified atrial flutter: Secondary | ICD-10-CM | POA: Diagnosis present

## 2020-11-21 DIAGNOSIS — Z794 Long term (current) use of insulin: Secondary | ICD-10-CM

## 2020-11-21 DIAGNOSIS — E1140 Type 2 diabetes mellitus with diabetic neuropathy, unspecified: Secondary | ICD-10-CM | POA: Diagnosis present

## 2020-11-21 DIAGNOSIS — R58 Hemorrhage, not elsewhere classified: Secondary | ICD-10-CM

## 2020-11-21 DIAGNOSIS — Z23 Encounter for immunization: Secondary | ICD-10-CM

## 2020-11-21 DIAGNOSIS — Z89511 Acquired absence of right leg below knee: Secondary | ICD-10-CM | POA: Diagnosis not present

## 2020-11-21 DIAGNOSIS — IMO0002 Reserved for concepts with insufficient information to code with codable children: Secondary | ICD-10-CM | POA: Diagnosis present

## 2020-11-21 DIAGNOSIS — K921 Melena: Secondary | ICD-10-CM

## 2020-11-21 LAB — CBC
HCT: 39.4 % (ref 39.0–52.0)
Hemoglobin: 13 g/dL (ref 13.0–17.0)
MCH: 25.6 pg — ABNORMAL LOW (ref 26.0–34.0)
MCHC: 33 g/dL (ref 30.0–36.0)
MCV: 77.6 fL — ABNORMAL LOW (ref 80.0–100.0)
Platelets: 248 10*3/uL (ref 150–400)
RBC: 5.08 MIL/uL (ref 4.22–5.81)
RDW: 15.6 % — ABNORMAL HIGH (ref 11.5–15.5)
WBC: 9.3 10*3/uL (ref 4.0–10.5)
nRBC: 0 % (ref 0.0–0.2)

## 2020-11-21 LAB — LACTIC ACID, PLASMA
Lactic Acid, Venous: 4.7 mmol/L (ref 0.5–1.9)
Lactic Acid, Venous: 3.6 mmol/L (ref 0.5–1.9)

## 2020-11-21 LAB — RESP PANEL BY RT-PCR (FLU A&B, COVID) ARPGX2
Influenza A by PCR: NEGATIVE
Influenza B by PCR: NEGATIVE
SARS Coronavirus 2 by RT PCR: NEGATIVE

## 2020-11-21 LAB — BASIC METABOLIC PANEL
Anion gap: 14 (ref 5–15)
BUN: 39 mg/dL — ABNORMAL HIGH (ref 6–20)
CO2: 28 mmol/L (ref 22–32)
Calcium: 8.9 mg/dL (ref 8.9–10.3)
Chloride: 90 mmol/L — ABNORMAL LOW (ref 98–111)
Creatinine, Ser: 1.49 mg/dL — ABNORMAL HIGH (ref 0.61–1.24)
GFR, Estimated: 55 mL/min — ABNORMAL LOW (ref >60)
Glucose, Bld: 419 mg/dL — ABNORMAL HIGH (ref 70–99)
Potassium: 4.4 mmol/L (ref 3.5–5.1)
Sodium: 132 mmol/L — ABNORMAL LOW (ref 135–145)

## 2020-11-21 LAB — CBG MONITORING, ED
Glucose-Capillary: 389 mg/dL — ABNORMAL HIGH (ref 70–99)
Glucose-Capillary: 285 mg/dL — ABNORMAL HIGH (ref 70–99)

## 2020-11-21 LAB — TYPE AND SCREEN
ABO/RH(D): O NEG
Antibody Screen: NEGATIVE

## 2020-11-21 LAB — HEPATIC FUNCTION PANEL
ALT: 15 U/L (ref 0–44)
AST: 31 U/L (ref 15–41)
Albumin: 3.3 g/dL — ABNORMAL LOW (ref 3.5–5.0)
Alkaline Phosphatase: 130 U/L — ABNORMAL HIGH (ref 38–126)
Bilirubin, Direct: 0.2 mg/dL (ref 0.0–0.2)
Indirect Bilirubin: 0.5 mg/dL (ref 0.3–0.9)
Total Bilirubin: 0.7 mg/dL (ref 0.3–1.2)
Total Protein: 6.9 g/dL (ref 6.5–8.1)

## 2020-11-21 LAB — APTT: aPTT: 22 seconds — ABNORMAL LOW (ref 24–36)

## 2020-11-21 LAB — PROCALCITONIN: Procalcitonin: <0.1 ng/mL

## 2020-11-21 LAB — BRAIN NATRIURETIC PEPTIDE: B Natriuretic Peptide: 25.8 pg/mL (ref 0.0–100.0)

## 2020-11-21 LAB — TROPONIN I (HIGH SENSITIVITY)
Troponin I (High Sensitivity): 14 ng/L (ref <18)
Troponin I (High Sensitivity): 13 ng/L (ref <18)

## 2020-11-21 LAB — PROTIME-INR
INR: 1 (ref 0.8–1.2)
Prothrombin Time: 13.5 seconds (ref 11.4–15.2)

## 2020-11-21 MED ORDER — ATORVASTATIN CALCIUM 20 MG PO TABS: 40.0000 mg | ORAL_TABLET | Freq: Every day | ORAL | Status: DC

## 2020-11-21 MED ORDER — FLUTICASONE PROPIONATE 50 MCG/ACT NA SUSP
2.0000 | Freq: Every day | NASAL | Status: DC | PRN
  Filled 2020-11-21: qty 16

## 2020-11-21 MED ORDER — TETANUS-DIPHTH-ACELL PERTUSSIS 5-2.5-18.5 LF-MCG/0.5 IM SUSY
0.5000 mL | PREFILLED_SYRINGE | Freq: Once | INTRAMUSCULAR | Status: AC
  Administered 2020-11-21: 0.5 mL via INTRAMUSCULAR
  Filled 2020-11-21: qty 0.5

## 2020-11-21 MED ORDER — PREGABALIN 75 MG PO CAPS: 150.0000 mg | ORAL_CAPSULE | Freq: Three times a day (TID) | ORAL | Status: DC

## 2020-11-21 MED ORDER — DULOXETINE HCL 30 MG PO CPEP
30.0000 mg | ORAL_CAPSULE | Freq: Every day | ORAL | Status: DC
  Filled 2020-11-21: qty 1

## 2020-11-21 MED ORDER — LACTATED RINGERS IV BOLUS
1000.0000 mL | Freq: Once | INTRAVENOUS | Status: AC
  Administered 2020-11-21: 1000 mL via INTRAVENOUS

## 2020-11-21 MED ORDER — INSULIN ASPART 100 UNIT/ML IJ SOLN
0.0000 [IU] | Freq: Every day | INTRAMUSCULAR | Status: DC
  Administered 2020-11-21: 3 [IU] via SUBCUTANEOUS
  Filled 2020-11-21: qty 1

## 2020-11-21 MED ORDER — ACETAMINOPHEN 325 MG PO TABS: 650.0000 mg | ORAL_TABLET | ORAL | Status: DC | PRN

## 2020-11-21 MED ORDER — ONDANSETRON HCL 4 MG/2ML IJ SOLN: 4.0000 mg | Freq: Four times a day (QID) | INTRAMUSCULAR | Status: DC | PRN

## 2020-11-21 MED ORDER — LORATADINE 10 MG PO TABS: 10.0000 mg | ORAL_TABLET | Freq: Every day | ORAL | Status: DC

## 2020-11-21 MED ORDER — DILTIAZEM HCL-DEXTROSE 125-5 MG/125ML-% IV SOLN (PREMIX)
5.0000 mg/h | INTRAVENOUS | Status: DC
Start: 2020-11-21 — End: 2020-11-22
  Administered 2020-11-21: 5 mg/h via INTRAVENOUS
  Filled 2020-11-21: qty 125

## 2020-11-21 MED ORDER — DILTIAZEM LOAD VIA INFUSION
10.0000 mg | Freq: Once | INTRAVENOUS | Status: AC
  Administered 2020-11-21: 10 mg via INTRAVENOUS
  Filled 2020-11-21: qty 10

## 2020-11-21 MED ORDER — IOHEXOL 350 MG/ML SOLN
125.0000 mL | Freq: Once | INTRAVENOUS | Status: AC | PRN
  Administered 2020-11-21: 125 mL via INTRAVENOUS

## 2020-11-21 MED ORDER — CARVEDILOL 6.25 MG PO TABS: 6.2500 mg | ORAL_TABLET | Freq: Two times a day (BID) | ORAL | Status: DC

## 2020-11-21 MED ORDER — INSULIN ASPART 100 UNIT/ML IJ SOLN: 0.0000 [IU] | Freq: Three times a day (TID) | INTRAMUSCULAR | Status: DC

## 2020-11-21 MED ORDER — SENNOSIDES-DOCUSATE SODIUM 8.6-50 MG PO TABS: 2.0000 | ORAL_TABLET | Freq: Two times a day (BID) | ORAL | Status: DC

## 2020-11-21 MED ORDER — LACTATED RINGERS IV BOLUS
500.0000 mL | Freq: Once | INTRAVENOUS | Status: AC
  Administered 2020-11-21: 500 mL via INTRAVENOUS

## 2020-11-21 MED ORDER — SODIUM CHLORIDE 0.9 % IV SOLN: INTRAVENOUS | Status: DC

## 2020-11-21 MED ORDER — INSULIN ASPART 100 UNIT/ML IJ SOLN
0.0000 [IU] | INTRAMUSCULAR | Status: DC
  Administered 2020-11-21: 15 [IU] via SUBCUTANEOUS
  Filled 2020-11-21: qty 1

## 2020-11-21 MED ORDER — POTASSIUM CHLORIDE CRYS ER 20 MEQ PO TBCR: 20.0000 meq | EXTENDED_RELEASE_TABLET | Freq: Two times a day (BID) | ORAL | Status: DC

## 2020-11-21 MED ORDER — APIXABAN 5 MG PO TABS: 5.0000 mg | ORAL_TABLET | Freq: Two times a day (BID) | ORAL | Status: DC
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
## 2020-11-21 NOTE — ED Notes (Signed)
Pt in CT, will start diltiazem when he returns.

## 2020-11-21 NOTE — ED Notes (Signed)
Lab called critical result, lactic acid 4.7. Provider notified at this time.

## 2020-11-22 NOTE — ED Notes (Signed)
Pt rolled out to his Lucianne Lei via wheelchair

## 2020-11-22 NOTE — ED Notes (Signed)
Admitting rounded on pt, also went over risks of leaving AMA. Pt remains adamant about leaving, will sign AMA paperwork

## 2020-11-22 NOTE — ED Notes (Signed)
When rounding on pt, he was dressed and had removed all monitoring, stating he wanted to leave AMA. Pt states he was tired of noises and wires, and feels like he could manage better at home. Pt advised of risks of leaving AMA. MD notified

## 2020-11-24 LAB — HEMOGLOBIN A1C
Hgb A1c MFr Bld: 11.8 % — ABNORMAL HIGH (ref 4.8–5.6)
Mean Plasma Glucose: 292 mg/dL

## 2020-11-26 LAB — CULTURE, BLOOD (SINGLE)
Culture: NO GROWTH
Special Requests: ADEQUATE

## 2021-03-07 ENCOUNTER — Emergency Department (HOSPITAL_COMMUNITY)
Admission: EM | Admit: 2021-03-07 | Discharge: 2021-03-08 | Disposition: A | Discharge status: Home / Self Care | Payer: Medicare Other | Attending: Emergency Medicine | Admitting: Emergency Medicine

## 2021-03-07 ENCOUNTER — Emergency Department (HOSPITAL_COMMUNITY): Payer: Medicare Other

## 2021-03-07 DIAGNOSIS — Z794 Long term (current) use of insulin: Secondary | ICD-10-CM | POA: Diagnosis not present

## 2021-03-07 DIAGNOSIS — S68113A Complete traumatic metacarpophalangeal amputation of left middle finger, initial encounter: Secondary | ICD-10-CM | POA: Insufficient documentation

## 2021-03-07 DIAGNOSIS — Z20822 Contact with and (suspected) exposure to covid-19: Secondary | ICD-10-CM | POA: Insufficient documentation

## 2021-03-07 DIAGNOSIS — Z23 Encounter for immunization: Secondary | ICD-10-CM | POA: Insufficient documentation

## 2021-03-07 DIAGNOSIS — S68119A Complete traumatic metacarpophalangeal amputation of unspecified finger, initial encounter: Secondary | ICD-10-CM

## 2021-03-07 DIAGNOSIS — X58XXXA Exposure to other specified factors, initial encounter: Secondary | ICD-10-CM | POA: Diagnosis not present

## 2021-03-07 DIAGNOSIS — E1122 Type 2 diabetes mellitus with diabetic chronic kidney disease: Secondary | ICD-10-CM | POA: Diagnosis not present

## 2021-03-07 DIAGNOSIS — Z7982 Long term (current) use of aspirin: Secondary | ICD-10-CM | POA: Insufficient documentation

## 2021-03-07 DIAGNOSIS — S6992XA Unspecified injury of left wrist, hand and finger(s), initial encounter: Secondary | ICD-10-CM | POA: Diagnosis present

## 2021-03-07 DIAGNOSIS — N184 Chronic kidney disease, stage 4 (severe): Secondary | ICD-10-CM | POA: Insufficient documentation

## 2021-03-07 LAB — COMPREHENSIVE METABOLIC PANEL
ALT: 14 U/L (ref 0–44)
AST: 25 U/L (ref 15–41)
Albumin: 3.1 g/dL — ABNORMAL LOW (ref 3.5–5.0)
Alkaline Phosphatase: 112 U/L (ref 38–126)
Anion gap: 12 (ref 5–15)
BUN: 22 mg/dL — ABNORMAL HIGH (ref 6–20)
CO2: 26 mmol/L (ref 22–32)
Calcium: 8.4 mg/dL — ABNORMAL LOW (ref 8.9–10.3)
Chloride: 99 mmol/L (ref 98–111)
Creatinine, Ser: 1.06 mg/dL (ref 0.61–1.24)
GFR, Estimated: >60 mL/min (ref >60)
Glucose, Bld: 370 mg/dL — ABNORMAL HIGH (ref 70–99)
Potassium: 5.1 mmol/L (ref 3.5–5.1)
Sodium: 137 mmol/L (ref 135–145)
Total Bilirubin: 0.8 mg/dL (ref 0.3–1.2)
Total Protein: 6.9 g/dL (ref 6.5–8.1)

## 2021-03-07 LAB — CBG MONITORING, ED: Glucose-Capillary: 345 mg/dL — ABNORMAL HIGH (ref 70–99)

## 2021-03-07 LAB — RESP PANEL BY RT-PCR (FLU A&B, COVID) ARPGX2
Influenza A by PCR: NEGATIVE
Influenza B by PCR: NEGATIVE
SARS Coronavirus 2 by RT PCR: NEGATIVE

## 2021-03-07 MED ORDER — CEFAZOLIN SODIUM-DEXTROSE 1-4 GM/50ML-% IV SOLN
1.0000 g | Freq: Once | INTRAVENOUS | Status: AC
  Administered 2021-03-08: 1 g via INTRAVENOUS
  Filled 2021-03-07: qty 50

## 2021-03-07 MED ORDER — TETANUS-DIPHTH-ACELL PERTUSSIS 5-2.5-18.5 LF-MCG/0.5 IM SUSY
0.5000 mL | PREFILLED_SYRINGE | Freq: Once | INTRAMUSCULAR | Status: AC
  Administered 2021-03-07: 0.5 mL via INTRAMUSCULAR
  Filled 2021-03-07: qty 0.5

## 2021-03-07 NOTE — Discharge Instructions (Addendum)
I am prescribing an antibiotic called doxycycline.  Please take this twice a day for the next 10 days.  Do not stop taking this medication early.  Please change the bandage on your finger daily.  If you develop any redness, swelling, fevers, discharge, bleeding, or generally worsening symptoms, please come back to the emergency department immediately.  Below is the contact information for Dr. Greta Doom.  He is a Museum/gallery conservator.  Please follow-up with him first thing on Monday morning and schedule an appointment for reevaluation of your finger.

## 2021-03-07 NOTE — ED Notes (Signed)
2320 Tournicot released by PA, currently irrigating wound.  Unable to est IV x 3 by 2 RN's
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
## 2021-03-07 NOTE — ED Notes (Signed)
Tournicot placed by Dr. Jeanell Sparrow [phone]
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
## 2021-03-08 LAB — CBC WITH DIFFERENTIAL/PLATELET
Abs Immature Granulocytes: 0.05 10*3/uL (ref 0.00–0.07)
Basophils Absolute: 0 10*3/uL (ref 0.0–0.1)
Basophils Relative: 0 %
Eosinophils Absolute: 0.1 10*3/uL (ref 0.0–0.5)
Eosinophils Relative: 1 %
HCT: 42 % (ref 39.0–52.0)
Hemoglobin: 12.9 g/dL — ABNORMAL LOW (ref 13.0–17.0)
Immature Granulocytes: 1 %
Lymphocytes Relative: 18 %
Lymphs Abs: 1.4 10*3/uL (ref 0.7–4.0)
MCH: 24.2 pg — ABNORMAL LOW (ref 26.0–34.0)
MCHC: 30.7 g/dL (ref 30.0–36.0)
MCV: 78.7 fL — ABNORMAL LOW (ref 80.0–100.0)
Monocytes Absolute: 0.6 10*3/uL (ref 0.1–1.0)
Monocytes Relative: 7 %
Neutro Abs: 5.8 10*3/uL (ref 1.7–7.7)
Neutrophils Relative %: 73 %
Platelets: 247 10*3/uL (ref 150–400)
RBC: 5.34 MIL/uL (ref 4.22–5.81)
RDW: 16.8 % — ABNORMAL HIGH (ref 11.5–15.5)
WBC: 8 10*3/uL (ref 4.0–10.5)
nRBC: 0 % (ref 0.0–0.2)

## 2021-03-08 MED ORDER — DOXYCYCLINE HYCLATE 100 MG PO CAPS: 100.0000 mg | ORAL_CAPSULE | Freq: Two times a day (BID) | ORAL | 0 refills | Status: DC

## 2021-03-08 NOTE — ED Notes (Signed)
Manual pressure applied to pt's affected digit (middle finger on left hand) to stop bleeding. Once bleeding stopped, pressure wrap applied to left hand with non-stick absorbant pads and kerlix. Pt's digit and hand wrapped with coban to secure and stabilize pressure wrap.

## 2021-03-08 NOTE — ED Notes (Signed)
[facility] called, 6-7th in line

## 2021-03-08 NOTE — ED Notes (Signed)
Patient verbalizes understanding of discharge instructions. Opportunity for questioning and answers were provided. Armband removed by staff, pt discharged from ED via [facility] to home.

## 2021-03-10 NOTE — Discharge Summary (Addendum)
 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 Started on apixaban  5 mg BID and metoprolol  succinate 50 mg daily. Discontinued home carvedilol .  -  Discharged on Augmentin  BID and doxycycline  BID to complete a total of seven days of antibiotics (through 1/16) - Started insulin  glargine 50 u QHS + regular 16u AC     Anticipatory Guidance for Outpatient Provider:   - Presented to hospital after self-amputation of L long finger (became necrotic after a burn that he did not feel due to his diabetic neuropathy) and underwent bedside I&D/amputation revision with orthopedics. Orthopedics recommended seven days of antibiotics, no concerns for osteo on their exam. He will have orthopedic follow-up, requested for within the next five days.  - Underwent TEE/DCCV on 1/11 for atrial flutter with successful conversion to NSR. Will need at least one month of uninterrupted anticoagulation following cardioversion (DOAC started despite BMI 54 due to concern of lack of  follow-up with healthcare system/INR checks with [facility]).  - Endocrinology follow-up scheduled for 2/10  Recommended Follow-up Studies:  - F/u A1c in three months following insulin  changes     Results Pending at Discharge:  None Please see phone numbers at end of this summary for lab contact information.   Follow-up/Care Transition Plan: Orthopaedic Surgery follow up within the next 5 business days with Dr. Aureliano Cowing, requested 03/10/21 AM.  Future Appointments  Date Time Provider Department Center  03/17/2021 10:00 AM Eliverto [name], MD [facility] Orthopedics [facility]  04/10/2021 10:00 AM ENDO HOSPITAL FOLLOW UP [facility]   Non[name] Provider Follow-up: none    Allergies/Intolerances:  Allergies  Allergen Reactions  . Prozac [Fluoxetine] Hallucination  . Sulfa (Sulfonamide Antibiotics) Hives  . Vancomycin  Analogues Vancomycin  Infusion Reaction  . Metformin Nausea  . Shellfish Containing Products Unknown     New Adverse Drug Events: none  Medications:   Current Discharge Medication List     START taking these medications   Details  amoxicillin -clavulanate (AUGMENTIN ) 875-125 mg tablet Take 1 tablet (875 mg total) by mouth 2 (two) times daily for 6 days Qty: 12 tablet, Refills: 0    apixaban  (ELIQUIS ) 5 mg tablet Take 1 tablet (5 mg total) by mouth every 12 (twelve) hours for 30 days Qty: 60 tablet, Refills: 0    clotrimazole  (LOTRIMIN ) 1 % cream Apply topically 2 (two) times daily for 13 days Qty: 24 g, Refills: 0    insulin  GLARGINE-yfgn (SEMGLEE ) injection (concentration 100 units/mL) Inject 50 Units subcutaneously daily with dinner for 30 days Qty: 15 mL, Refills: 0    doxycycline  (VIBRAMYCIN ) 100 MG capsule Take 1 capsule (100 mg total) by mouth 2 (two) times daily for 12 doses Qty: 12 capsule, Refills: 0  metoprolol  succinate (TOPROL  XL) 50 MG XL tablet Take 1 tablet (50 mg total) by mouth at bedtime Qty: 30 tablet, Refills: 0       CONTINUE these  medications which have CHANGED   Details  insulin  REGULAR (HUMULIN  R REGULAR U-100 INSULN) injection (concentration 100 units/mL) Inject 16 Units subcutaneously 3 (three) times daily before meals       CONTINUE these medications which have NOT CHANGED   Details  ACCU-CHEK FASTCLIX LANCET DRUM USE 3 TIMES DAILY TO CHECK BLOOD SUGAR Qty: 102 each, Refills: 0    atorvastatin  (LIPITOR) 40 MG tablet Take 1 tablet (40 mg total) by mouth once daily Qty: 30 tablet, Refills: 10   Associated Diagnoses: Hyperlipidemia, mixed    BD INSULIN  SYRINGE ULTRA-FINE 1 mL 30 gauge x 1/2 syringe USE AS DIRECTED Qty: 100 each, Refills: 0   Associated Diagnoses: Uncontrolled type 2 diabetes mellitus with complication, with long-term current use of insulin     blood glucose diagnostic (ACCU-CHEK GUIDE TEST STRIPS) test strip 3 (three) times daily Qty: 100 strip, Refills: 1   Associated Diagnoses: Uncontrolled type 2 diabetes mellitus with complication, with long-term current use of insulin     blood glucose meter kit Use as instructed to test blood sugar 3 times daily. Dx E11.9 Qty: 1 each, Refills: 0    DULoxetine  (CYMBALTA ) 30 MG DR capsule TAKE 3 CAPSULES BY MOUTH ONCE DAILY Qty: 270 capsule, Refills: 0   Associated Diagnoses: Chronic painful diabetic neuropathy (CMS-HCC)    FUROsemide  (LASIX ) 80 MG tablet Take 1 tablet (80 mg total) by mouth 2 (two) times daily as needed for Edema Qty: 60 tablet, Refills: 11   Associated Diagnoses: Peripheral edema    lisinopriL  (ZESTRIL ) 10 MG tablet Take 1 tablet (10 mg total) by mouth 2 (two) times daily Qty: 180 tablet, Refills: 2   Associated Diagnoses: Essential hypertension    pregabalin  (LYRICA ) 150 MG capsule Take 1 capsule (150 mg total) by mouth 3 (three) times daily Qty: 90 capsule, Refills: 6   Associated Diagnoses: Peripheral polyneuropathy; Chronic painful diabetic neuropathy (CMS-HCC)       STOP taking these medications     carvediloL  (COREG )  6.25 MG tablet          Brief History of Present Illness: Russell [name] is a 56 y.o. male with a PMH of IDDM, 2:1 Aflutter, HFpEF, HTN, HLD, Obesity, RLE Osteomyelitis s/p R BKA and MDD presenting after self-amputating his finger.    Patient was pulling a glass dish out of the microwave ~6 weeks ago. He said he suffered a third degree burn from it but did not notice initially because of his neuropathy. He said it was deep and the skin stuck to the glass. From there the wound became infected and eventually became necrotic over the course of multiple weeks. It became wet, smelly and gangrenous with a hole at the tip of the finger. Because of this, he decided to self-amputate the finger with kitchen shears on 1/6. He then presented to [facility] on 1/7 for treatment but was told that the hospital's hand surgeon would not be able to see him. He was d/c'd on PO abx which he never actually picked up. He presented here for treatment. He feels no pain in that hand d/t his neuropathy.    In regards to his atrial flutter, he's noticed it from October. He notes he'd intermittently be in the 130s at home as seen on his pulse ox. He endorses occasionally feeling  chest pain at home. The CP is random. Doesn't believe exertion causes it but he also states he does very minimal exertion as he is quite deconditioned d/t his body weight. He denies ever feeling it going fast. He was planning on seeing a doctor for It but never go around to it.     Denies: fevers, chills, NS, CP, palpitations, n/v, abdominal pain, diaphoresis   ED Course: Upon presentation at [facility], initial vital signs were notable for Temp: 36.8 C (98.2 F)  HR: (!) 130  BP 118/60  RR 18  SpO2 98 % on RA. Labs were significant for Hgb 12.4. EKG showed A-Flutter w/ 2:1 conduction. Ortho was consulted and they did a bedside I&D and amputation revision. Recommended Zosyn  or 7d of PO abx if he discharged. Will f/u with him in 5 business days.  Cardio was consulted for his A-flutter. Want to cardiovert so recommended echo, AC, metop and diuresis. He was then admitted to [facility].  _____________________  Hospital Course by Problem:  SEE ABOVE ANTICIPATORY GUIDANCE  # L Middle Finger Necrosis s/p Self-Amputation s/p Revision at bed side on 1/11 # Diabetic peripheral neuropathy  Patient presented after self-amputating his finger after it slowly necrosing and the patient becoming fearful of infection. Ortho saw patient and I&D'd with amputation revision on 1/11 without any concern for osteomyelitis. Did not have cellullitis on exam.Got TDap 1/7. They recommend Zosyn  while inpatient (1/9-1/10) and then transitioned to PO Augmentin  BID and doxycycline  BID to complete a total of seven days of antibiotics (through 1/16). Patient did not actually require admission for his self-amputation but was admitted for his atrial flutter (addressed below). He will have follow-up with orthopedics in approximately five days (appointment requested by orthopedics).no concern for vascular event,+1 radial pulse on left,warm hand and good cap refill. Xray on admission  slight haziness of volar distal aspect,ortho continued the amputation to healthy bone and no bone exposed   # Uncontrolled IDDM (Hgb A1c 10.8% 03/2021) c/b Neuropathy & RLE BKA Patient has been quite uncontrolled for years now. He states that he does not actively seek care from doctors. He is supposed to be on 80u of Regular TID CC but he takes it as he pleases, thinking he takes ~200u in total per day. He has tried a GLP1 agonist in the past but discontinued due to GI side effects. His disease has resulted in loss of feeling in both his hands and RLE BKA, and resulted in his presentation on admission. A1c on admission 10.8%. Consulted endocrinology, who recommended a regimen of insulin  glargine 50 u QHS + regular 16u AC. Emphasized importance of diabetes control given his multiple amputations already. Did  not tolerate metformin in past.He will have endocrinology follow-up on 04/10/2021.   # A-Flutter 2:1 Block, s/p DCCV on 1/11  Patient believes he's had this since October. Found to be in it on admission here with HR to the 130s. Cardio recommending cardioversion while inpatient due to concern for lack of follow-up with the healthcare system. TTE with no thrombus and normal biventricular systolic function, diastolic function class indeterminate. Started on apixaban  5 mg BID (of note BMI 54 but unlikely to be adherent to INR checks with [facility]), started on fractionated metoprolol . Underwent TEE/DCCV on 1/11 with successful conversion into NSR. Consolidated metoprolol  to succinate 50 mg daily and discontinued home carvedilol . He will need to be on uninterrupted anticoagulation for at least 4 weeks post-cardioversion but preferably for longer since has chadvasc score. He will  need thyroid  function tests checked as outpatient   # HFpEF # HTN, # HLD   TTE on 1/10 with no thrombus and normal biventricular systolic function, diastolic function class indeterminate. - Continue home Lisinopril  10mg  BID - Continue home Atorvastatin  40mg   - Continue home Lasix  80mg  BID We think lasix  is working since managing his fluid but he does not feel he urinates a lot on it so if concern for volume overload on lasix  despite adherence,then consider switching to torsemide . He has some edema in LLE below  but also in flutter and did not want to adjust his diuretics now that he is in NSR,patient did not think he is volume overloaded and did not think he is in acute CHF since he retains muchmore fluid if he was and his wt will be much higher than this May benefit from referral to cardiopulm rehab if interested   # MDD- Continue home Duloxetine  90mg   #tinea pedis left foot #wounds left leg on chin and on dorsal aspect of foot close to ankle,not acutely infected,not deep,wound team advised on recs Patient advised on wound care and  ketoconazole Follow up as outpatient Dopplerable PT/DP AND good cap refill  #   #sleep apnea  Does not have CPAP at home since he has not been compliant with it Follow up as outpatient esp since he has flutter  I do not think patient will do well down the road and he wad advised to take his health seriously,he wanted a regular diet and will continue eating dessert and cookies since this is only thing that provides joy for him AND since he cannot afford a healthy diabetic diet he did not think he was depressed though and denied SI/HI    Surgeries and Procedures Performed:   1/10 Bedside I&D of L finger by orthopedics  1/11 TEE/DCCV  RESULTS ----------------------------------------------------------------------   Successful DC cardioversion performed.   Patient discharged to room.   _____________________  Discharge Exam:  BP 116/63 (BP Location: Right forearm, Patient Position: Sitting)   Pulse 82   Temp 36.7 C (98.1 F) (Oral)   Resp 18   Ht 180.3 cm (5' 10.98)   Wt (!) 176 kg (388 lb 0.2 oz)   SpO2 98%   BMI 54.14 kg/m  O2 Device: Nasal cannula (03/11/21 0850) General: alert, cooperative, in NAD Eyes: conjunctiva clear, anicteric sclera HENT: oropharynx clear, moist mucous membranes Neck: no adenopathy, no JVD, supple, symmetrical CV: regular rate and rhythm, without murmurs, rubs or gallops Resp: clear to auscultation, good air exchange Abd: soft, nontender, nondistended, normoactive bowel sounds  Rectal: deferred Ext: amputated L middle finger with clean gauze and bandages  Skin: lower extremity venous stasis, RLE BKA  Psych: oriented to time, place and person, mood and affect are appropriate Neuro: Sensory: loss of light touch in xtremities Grossly normal and symmetric strength in upper and lower extremities. Normal coordination.  Pertinent Lab Testing: Recent Labs  Lab 03/09/21 1648 03/09/21 1959 03/11/21 0436  NA 138  --  136  K 4.9   < > 4.4  CL 99  --   100  CO2 30  --  27  BUN 19  --  21*  CREATININE 1.1  --  1.1  GLUCOSE 236*  --  273*  CALCIUM  8.6*  --  8.7   < > = values in this interval not displayed.   Recent Labs  Lab 03/09/21 1648  AST 25  ALT 17  ALKPHOS 105  TBILI 0.7  Recent Labs  Lab 03/09/21 1648 03/11/21 0436  WBC 7.9 7.2  HGB 12.4* 12.5*  HCT 39.2 39.9  PLT 260 211   Recent Labs  Lab 03/09/21 1648  APTT 25.6*  INR 1.0     Other Pertinent Labs: none  Pertinent Imaging:  03/09/2021 X-ray hand left Impression:  Status post amputation middle finger up to level of the PIP joint. On the oblique view, slight haziness of the volar distal proximal phalangeal bone. Findings may be projectional versus postsurgical change versus early signs of osteomyelitis. MRI recommended for further evaluation.  03/10/2021 Echo complete  INTERPRETATION ---------------------------------------------------------------   NORMAL LEFT VENTRICULAR SYSTOLIC FUNCTION   NORMAL RIGHT VENTRICULAR SYSTOLIC FUNCTION   NO VALVULAR REGURGITATION   NO VALVULAR STENOSIS   POOR SOUND TRANSMISSION DESPITE USE OF DEFINITY   INSUFFICIENT TR TO ESTIMATE RVSP   POOR SOUND TRANSMISSION  03/10/2021 Echo TEE with CV  CONCLUSIONS ------------------------------------------------------------------   1. LIMITED TEE DUE TO PATIENT CLINICAL STATUS.   2. NO LA/LAA THROMBUS. _____________________  Code Status: Full Goals of care were not addressed during this admission.  Status on Discharge:  Cognitive: normal ADLs: normal Current activity: Walks occasionally (03/11/21 1015) Current mobility: Slightly limited (03/11/21 1015)  Activity Recommendation: non weight bearing of LUE  Other Discharge Instructions: Services setup at discharge (Home health, Nursing, Infusion, PT/OT): none Wound care: keep wound clean and dry Tubes/lines at discharge: none  Discharge to Outside Facility: Behavioral issues in hospital: none Diet (including  supplements/tube feeds): No diet orders on file _____________________  Time spent on discharge process: 35 minutes     Russell Cline, MD Internal Medicine/Pediatrics, PGY-3 [facility]  Russell MARTINA CLINE, MD [facility]  03/12/2021   Hospital Contact Information:  [facility]) [name]) [name])  Pending tests:  Laboratory: [phone] Microbiology: [phone] Pathology: [phone] Radiology: [phone]  General questions: [phone] Pending tests: Laboratory: [phone] Microbiology: [phone] Pathology: [phone] Radiology: [phone]  General questions:  [phone] Pending tests:  Laboratory: [phone] Microbiology: [phone] Pathology: [phone] Radiology: [phone]  General questions:  [phone]     Attending Attestation:    I personally saw and evaluated the patient, and participated in the management and treatment plan as documented in the resident/fellow note.  MORGAN CINDERELLA MARYLAND, MD

## 2021-07-22 ENCOUNTER — Emergency Department
Admission: EM | Admit: 2021-07-22 | Discharge: 2021-07-22 | Disposition: A | Discharge status: Home / Self Care | Payer: Medicare Other | Attending: Emergency Medicine | Admitting: Emergency Medicine

## 2021-07-22 ENCOUNTER — Emergency Department: Payer: Medicare Other

## 2021-07-22 ENCOUNTER — Other Ambulatory Visit: Payer: Self-pay

## 2021-07-22 DIAGNOSIS — H9201 Otalgia, right ear: Secondary | ICD-10-CM | POA: Insufficient documentation

## 2021-07-22 DIAGNOSIS — M542 Cervicalgia: Secondary | ICD-10-CM | POA: Insufficient documentation

## 2021-07-22 DIAGNOSIS — E119 Type 2 diabetes mellitus without complications: Secondary | ICD-10-CM | POA: Insufficient documentation

## 2021-07-22 DIAGNOSIS — I5032 Chronic diastolic (congestive) heart failure: Secondary | ICD-10-CM | POA: Diagnosis not present

## 2021-07-22 DIAGNOSIS — I110 Hypertensive heart disease with heart failure: Secondary | ICD-10-CM | POA: Insufficient documentation

## 2021-07-22 LAB — CBC WITH DIFFERENTIAL/PLATELET
Abs Immature Granulocytes: 0.07 10*3/uL (ref 0.00–0.07)
Basophils Absolute: 0 10*3/uL (ref 0.0–0.1)
Basophils Relative: 1 %
Eosinophils Absolute: 0.3 10*3/uL (ref 0.0–0.5)
Eosinophils Relative: 3 %
HCT: 43.2 % (ref 39.0–52.0)
Hemoglobin: 13.2 g/dL (ref 13.0–17.0)
Immature Granulocytes: 1 %
Lymphocytes Relative: 21 %
Lymphs Abs: 1.8 10*3/uL (ref 0.7–4.0)
MCH: 24 pg — ABNORMAL LOW (ref 26.0–34.0)
MCHC: 30.6 g/dL (ref 30.0–36.0)
MCV: 78.5 fL — ABNORMAL LOW (ref 80.0–100.0)
Monocytes Absolute: 0.6 10*3/uL (ref 0.1–1.0)
Monocytes Relative: 7 %
Neutro Abs: 5.5 10*3/uL (ref 1.7–7.7)
Neutrophils Relative %: 67 %
Platelets: 276 10*3/uL (ref 150–400)
RBC: 5.5 MIL/uL (ref 4.22–5.81)
RDW: 15.8 % — ABNORMAL HIGH (ref 11.5–15.5)
WBC: 8.2 10*3/uL (ref 4.0–10.5)
nRBC: 0 % (ref 0.0–0.2)

## 2021-07-22 LAB — COMPREHENSIVE METABOLIC PANEL
ALT: 20 U/L (ref 0–44)
AST: 23 U/L (ref 15–41)
Albumin: 3.3 g/dL — ABNORMAL LOW (ref 3.5–5.0)
Alkaline Phosphatase: 113 U/L (ref 38–126)
Anion gap: 13 (ref 5–15)
BUN: 43 mg/dL — ABNORMAL HIGH (ref 6–20)
CO2: 29 mmol/L (ref 22–32)
Calcium: 9.3 mg/dL (ref 8.9–10.3)
Chloride: 92 mmol/L — ABNORMAL LOW (ref 98–111)
Creatinine, Ser: 1.33 mg/dL — ABNORMAL HIGH (ref 0.61–1.24)
GFR, Estimated: >60 mL/min (ref >60)
Glucose, Bld: 263 mg/dL — ABNORMAL HIGH (ref 70–99)
Potassium: 4.7 mmol/L (ref 3.5–5.1)
Sodium: 134 mmol/L — ABNORMAL LOW (ref 135–145)
Total Bilirubin: 0.5 mg/dL (ref 0.3–1.2)
Total Protein: 7.1 g/dL (ref 6.5–8.1)

## 2021-07-22 MED ORDER — AMOXICILLIN-POT CLAVULANATE 875-125 MG PO TABS: 1.0000 | ORAL_TABLET | Freq: Two times a day (BID) | ORAL | 0 refills | Status: AC

## 2021-07-22 MED ORDER — IOHEXOL 350 MG/ML SOLN
75.0000 mL | Freq: Once | INTRAVENOUS | Status: AC | PRN
  Administered 2021-07-22: 75 mL via INTRAVENOUS

## 2021-07-22 MED ORDER — AMOXICILLIN-POT CLAVULANATE 875-125 MG PO TABS
1.0000 | ORAL_TABLET | Freq: Once | ORAL | Status: AC
  Administered 2021-07-22: 1 via ORAL
  Filled 2021-07-22: qty 1

## 2021-07-22 NOTE — ED Notes (Signed)
Signature pad in room not working at this time. Discharge instructions and prescription and follow up care reviewed with pt. Pt verbalized understanding and denies any questions at this time.

## 2021-07-22 NOTE — ED Triage Notes (Signed)
Pt c/o right ear pain for the past 2 weeks

## 2021-07-22 NOTE — ED Provider Triage Note (Signed)
Emergency Medicine Provider Triage Evaluation Note  Russell [name] , a 56 y.o. male  was evaluated in triage.  Patient has a history of diabetes, hypertension, congestive heart failure, peripheral edema  complains of presents to the emergency department with right ear pain that radiates along the right anterior neck.  Patient states that he has been symptomatic for the past 2 to 3 days. No fever or chills. No history of otitis media.   Review of Systems  Positive: Patient has right eye pain that radiates to his neck.  Negative: No chest pain, chest tightness or shortness of breath.   Physical Exam  There were no vitals taken for this visit. Gen:   Awake, no distress   Resp:  Normal effort  MSK:   Moves extremities without difficulty  Other:    Medical Decision Making  Medically screening exam initiated at 3:23 PM.  Appropriate orders placed.  Russell [name] was informed that the remainder of the evaluation will be completed by another provider, this initial triage assessment does not replace that evaluation, and the importance of remaining in the ED until their evaluation is complete.     Vallarie Mare Geraldine, [place] 07/22/21 1524

## 2021-07-22 NOTE — ED Notes (Signed)
Pt states that he has been having pain in his right ear for about 2 weeks. Pt reports that swelling in the ear has went down but he is still having swelling in his right jaw and he is having problems opening his jaw. Pt has been taking OTC pain medications without relief.

## 2021-07-30 NOTE — ED Provider Notes (Signed)
[facility] Provider Note    Event Date/Time   First MD Initiated Contact with Patient 07/22/21 1530     (approximate)   History   Ear Pain   HPI  Russell [name] is a 56 y.o. male presenting to the emergency department for treatment and evaluation of right ear and neck pain.  Symptoms started about 2 weeks ago.  He has had no hearing loss.  No fever.  No difficulty swallowing but when he does swallow he can feel pain in the right ear.  He has had no cough or other URI symptoms.  No alleviating measures attempted prior to arrival.  Past Medical History:  Diagnosis Date   Anxiety    CHF (congestive heart failure) (HCC)    EF 30-35%   Chronic diastolic (congestive) heart failure (HCC)    Depression    Diabetes mellitus without complication (HCC)    Hyperlipemia    Hypertension    Morbid obesity (HCC)    Peripheral edema          Physical Exam   Triage Vital Signs: ED Triage Vitals  Enc Vitals Group     BP 07/22/21 1519 (!) 102/57     Pulse Rate 07/22/21 1519 78     Resp 07/22/21 1519 20     Temp 07/22/21 1519 (!) 97.5 F (36.4 C)     Temp Source 07/22/21 1519 Oral     SpO2 07/22/21 1519 94 %     Weight --      Height --      Head Circumference --      Peak Flow --      Pain Score 07/22/21 1506 6     Pain Loc --      Pain Edu? --      Excl. in [place]? --     Most recent vital signs: Vitals:   07/22/21 1519  BP: (!) 102/57  Pulse: 78  Resp: 20  Temp: (!) 97.5 F (36.4 C)  SpO2: 94%    General: Awake, no distress.  CV:  Good peripheral perfusion.  Resp:  Normal effort.  Abd:  No distention.  Other:  Right TM is normal.  Airway is patent.  Tonsils flat/not visualized.  No difficulty swallowing on command.  No palpable lymphadenopathy in the anterior cervical chain, however due to body habitus assessment is limited.  Tenderness in the preauricular area on palpation.  Pain increases with jaw extension.   ED Results / Procedures  / Treatments   Labs (all labs ordered are listed, but only abnormal results are displayed) Labs Reviewed  CBC WITH DIFFERENTIAL/PLATELET - Abnormal; Notable for the following components:      Result Value   MCV 78.5 (*)    MCH 24.0 (*)    RDW 15.8 (*)    All other components within normal limits  COMPREHENSIVE METABOLIC PANEL - Abnormal; Notable for the following components:   Sodium 134 (*)    Chloride 92 (*)    Glucose, Bld 263 (*)    BUN 43 (*)    Creatinine, Ser 1.33 (*)    Albumin 3.3 (*)    All other components within normal limits     EKG     RADIOLOGY  CT soft tissue neck and CT temporal bones are all negative for acute concerns.  I have independently reviewed and interpreted imaging as well as reviewed report from radiology.  PROCEDURES:  Critical Care performed: No  Procedures  MEDICATIONS ORDERED IN ED:  Medications  iohexol (OMNIPAQUE) 350 MG/ML injection 75 mL (75 mLs Intravenous Contrast Given 07/22/21 1712)  amoxicillin-clavulanate (AUGMENTIN) 875-125 MG per tablet 1 tablet (1 tablet Oral Given 07/22/21 1744)     IMPRESSION / MDM / ASSESSMENT AND PLAN / ED COURSE   I reviewed the triage vital signs and the nursing notes.                              Differential diagnosis includes, but is not limited to: Sialoadenitis, parotitis, otitis media, lymphadenopathy, mastoiditis  Patient's presentation is most consistent with acute complicated illness / injury requiring diagnostic workup.  56 year old male presenting to the emergency department for 2 weeks of right ear pain.  See HPI for further details.  Exam is limited due to body habitus.  He does have some tenderness over the preauricular area on the right as well as increased pain with extension of the jaw.  Plan will be to get a CT of the temporal bones to rule out mastoiditis as well as a CT soft tissue of the neck to rule out parotitis or other deep space infection that is not easily identified  on exam.  Lab studies appear to be at or near patient's baseline.  CTs are negative for any acute findings.  Because the patient's symptoms have been ongoing for the past 2 weeks and are not improving, will trial antibiotic and have him follow-up with ENT if not improving.  Patient is aware and agreeable to this plan.      FINAL CLINICAL IMPRESSION(S) / ED DIAGNOSES   Final diagnoses:  Right ear pain     Rx / DC Orders   ED Discharge Orders          Ordered    amoxicillin-clavulanate (AUGMENTIN) 875-125 MG tablet  2 times daily        07/22/21 1740             Note:  This document was prepared using Dragon voice recognition software and may include unintentional dictation errors.   Victorino Dike, FNP 07/30/21 2345    Naaman Plummer, MD 08/05/21 [phone]

## 2021-09-29 ENCOUNTER — Emergency Department
Admission: EM | Admit: 2021-09-29 | Discharge: 2021-09-30 | Disposition: A | Discharge status: Home / Self Care | Payer: Medicare Other | Attending: Emergency Medicine | Admitting: Emergency Medicine

## 2021-09-29 ENCOUNTER — Emergency Department: Payer: Medicare Other

## 2021-09-29 DIAGNOSIS — K644 Residual hemorrhoidal skin tags: Secondary | ICD-10-CM | POA: Insufficient documentation

## 2021-09-29 DIAGNOSIS — K922 Gastrointestinal hemorrhage, unspecified: Secondary | ICD-10-CM | POA: Insufficient documentation

## 2021-09-29 DIAGNOSIS — Z7901 Long term (current) use of anticoagulants: Secondary | ICD-10-CM | POA: Insufficient documentation

## 2021-09-29 DIAGNOSIS — E1122 Type 2 diabetes mellitus with diabetic chronic kidney disease: Secondary | ICD-10-CM | POA: Diagnosis not present

## 2021-09-29 DIAGNOSIS — N189 Chronic kidney disease, unspecified: Secondary | ICD-10-CM | POA: Insufficient documentation

## 2021-09-29 LAB — CBC WITH DIFFERENTIAL/PLATELET
Abs Immature Granulocytes: 0.04 10*3/uL (ref 0.00–0.07)
Basophils Absolute: 0 10*3/uL (ref 0.0–0.1)
Basophils Relative: 0 %
Eosinophils Absolute: 0.1 10*3/uL (ref 0.0–0.5)
Eosinophils Relative: 2 %
HCT: 38.1 % — ABNORMAL LOW (ref 39.0–52.0)
Hemoglobin: 12 g/dL — ABNORMAL LOW (ref 13.0–17.0)
Immature Granulocytes: 1 %
Lymphocytes Relative: 16 %
Lymphs Abs: 1.4 10*3/uL (ref 0.7–4.0)
MCH: 25.2 pg — ABNORMAL LOW (ref 26.0–34.0)
MCHC: 31.5 g/dL (ref 30.0–36.0)
MCV: 80 fL (ref 80.0–100.0)
Monocytes Absolute: 0.5 10*3/uL (ref 0.1–1.0)
Monocytes Relative: 6 %
Neutro Abs: 6.3 10*3/uL (ref 1.7–7.7)
Neutrophils Relative %: 75 %
Platelets: 218 10*3/uL (ref 150–400)
RBC: 4.76 MIL/uL (ref 4.22–5.81)
RDW: 16.3 % — ABNORMAL HIGH (ref 11.5–15.5)
WBC: 8.4 10*3/uL (ref 4.0–10.5)
nRBC: 0 % (ref 0.0–0.2)

## 2021-09-29 LAB — BASIC METABOLIC PANEL
Anion gap: 10 (ref 5–15)
BUN: 26 mg/dL — ABNORMAL HIGH (ref 6–20)
CO2: 27 mmol/L (ref 22–32)
Calcium: 9 mg/dL (ref 8.9–10.3)
Chloride: 97 mmol/L — ABNORMAL LOW (ref 98–111)
Creatinine, Ser: 1.18 mg/dL (ref 0.61–1.24)
GFR, Estimated: >60 mL/min (ref >60)
Glucose, Bld: 374 mg/dL — ABNORMAL HIGH (ref 70–99)
Potassium: 4.8 mmol/L (ref 3.5–5.1)
Sodium: 134 mmol/L — ABNORMAL LOW (ref 135–145)

## 2021-09-29 LAB — PROTIME-INR
INR: 1.1 (ref 0.8–1.2)
Prothrombin Time: 13.6 seconds (ref 11.4–15.2)

## 2021-09-29 LAB — APTT: aPTT: 27 seconds (ref 24–36)

## 2021-09-29 MED ORDER — HYDROMORPHONE HCL 1 MG/ML IJ SOLN: 1.0000 mg | Freq: Once | INTRAMUSCULAR | Status: DC

## 2021-09-29 NOTE — ED Notes (Signed)
Pt sitting on edge of bed at the end. Pt advised by tech and this RN to get back in bed. Pt refused. Provider made aware. Provider also made aware that pt wishes to [facility]

## 2021-09-29 NOTE — ED Provider Notes (Signed)
[facility] Provider Note    Event Date/Time   First MD Initiated Contact with Patient 09/29/21 2257     (approximate)   History   GI Bleeding (From home, c/o bright red blood X3 days in stool, feeling stating "I just feel sick")   HPI  Russell [name] is a 56 y.o. male who presents to the ED for evaluation of GI Bleeding (From home, c/o bright red blood X3 days in stool, feeling stating "I just feel sick")   I reviewed PCP visit from 4/17.  Morbidly obese patient with history of DM, atrial flutter, CKD.  Anticoagulated on Eliquis.  Patient presents to the ED via EMS from home for evaluation of feeling sick and blood in his stools.  He came in at the direction of the nurse he spoke with on the phone from his insurance company.  He reports 2-3 days of waxing and waning constipation and diarrhea.  Reports he had abdominal pain yesterday, but not much today.  Reports he has been having bright red blood on the outside of his hard stools without any hematochezia or frankly bloody stools.  Reports he thinks it is just his external hemorrhoids, he reports anorectal discomfort with sitting.  Denies any emesis, fever, chest pain, back pain, falls, syncope.  Does report poor intake alongside his stools waxing and waning.  Physical Exam   Triage Vital Signs: ED Triage Vitals  Enc Vitals Group     BP --      Pulse Rate 09/29/21 2253 83     Resp 09/29/21 2253 16     Temp 09/29/21 2253 98.3 F (36.8 C)     Temp Source 09/29/21 2253 Oral     SpO2 09/29/21 2253 96 %     Weight 09/29/21 2252 (!) 390 lb (176.9 kg)     Height 09/29/21 2252 5\' 11"  (1.803 m)     Head Circumference --      Peak Flow --      Pain Score 09/29/21 2251 10     Pain Loc --      Pain Edu? --      Excl. in [place]? --     Most recent vital signs: Vitals:   09/29/21 2318 09/30/21 0038  BP: (!) 188/90 (!) 158/75  Pulse:  86  Resp:  16  Temp:  98.3 F (36.8 C)  SpO2:  93%     General: Awake, no distress.  Morbidly obese, right BKA with a clean stump. CV:  Good peripheral perfusion.  Resp:  Normal effort.  Abd:  No distention.  Minimal and diffuse lower abdominal tenderness without localizing or peritoneal features.  No CVA tenderness, signs of trauma to the back Rectal exam chaperoned by nursing staff demonstrates a nonthrombosed external hemorrhoid on the right side without any active bleeding.  Lubricated DRE with brown stool in the rectal vault that is Hemoccult negative MSK:  No deformity noted.  Right BKA with clean stump.  No signs of trauma Neuro:  No focal deficits appreciated. Other:     ED Results / Procedures / Treatments   Labs (all labs ordered are listed, but only abnormal results are displayed) Labs Reviewed  CBC WITH DIFFERENTIAL/PLATELET - Abnormal; Notable for the following components:      Result Value   Hemoglobin 12.0 (*)    HCT 38.1 (*)    MCH 25.2 (*)    RDW 16.3 (*)    All other components within normal  limits  BASIC METABOLIC PANEL - Abnormal; Notable for the following components:   Sodium 134 (*)    Chloride 97 (*)    Glucose, Bld 374 (*)    BUN 26 (*)    All other components within normal limits  PROTIME-INR  APTT  HEPATIC FUNCTION PANEL  TYPE AND SCREEN    EKG   RADIOLOGY CT abdomen/pelvis interpreted by me without evidence of acute intra-abdominal pathology.  Official radiology report(s): CT ABDOMEN PELVIS WO CONTRAST  Result Date: 09/30/2021 CLINICAL DATA:  Lower abdominal pain. EXAM: CT ABDOMEN AND PELVIS WITHOUT CONTRAST TECHNIQUE: Multidetector CT imaging of the abdomen and pelvis was performed following the standard protocol without IV contrast. RADIATION DOSE REDUCTION: This exam was performed according to the departmental dose-optimization program which includes automated exposure control, adjustment of the mA and/or kV according to patient size and/or use of iterative reconstruction technique.  COMPARISON:  CT abdomen pelvis dated 11/21/2020. FINDINGS: Evaluation is limited due to body habitus which extends outside of the field of view. Evaluation is also in the absence of intravenous contrast. Lower chest: The visualized lung bases are clear. There is coronary vascular calcification. No intra-abdominal free air or free fluid. Hepatobiliary: Fatty liver. No biliary ductal dilatation. The gallbladder is unremarkable Pancreas: Moderate pancreatic atrophy. No active inflammatory changes. Spleen: Normal in size without focal abnormality. Adrenals/Urinary Tract: The adrenal glands are unremarkable. The kidneys, visualized ureters, and urinary bladder appear unremarkable. Stomach/Bowel: There is no bowel obstruction or active inflammation. Appendectomy. Vascular/Lymphatic: Mild atherosclerotic calcification of the aorta. The IVC is unremarkable no portal venous gas. There is no adenopathy. Reproductive: The prostate and seminal vesicles are grossly unremarkable. Other: None Musculoskeletal: Fractures of the left L3-L4 transverse processes. IMPRESSION: 1. Fractures of the left L3-L4 transverse processes. 2. No bowel obstruction. 3. Fatty liver. 4. Aortic Atherosclerosis (ICD10-I70.0). Electronically Signed   By: Anner Crete M.D.   On: 09/30/2021 00:05    PROCEDURES and INTERVENTIONS:  Procedures  Medications  HYDROmorphone (DILAUDID) injection 1 mg (1 mg Intravenous Patient Refused/Not Given 09/30/21 0003)  ondansetron (ZOFRAN) injection 4 mg (4 mg Intravenous Given 09/30/21 0039)     IMPRESSION / MDM / ASSESSMENT AND PLAN / ED COURSE  I reviewed the triage vital signs and the nursing notes.  Differential diagnosis includes, but is not limited to, bleeding hemorrhoid, diverticulitis, SBO, colitis, and fissure  {Patient presents with symptoms of an acute illness or injury that is potentially life-threatening.  Anticoagulated male presents from home with complaints of rectal bleeding, likely  external hemorrhoids, ultimately suitable for trial of outpatient management at his demand.  He is not tachycardic and does not have signs of significant blood loss anemia or instability.  Blood work with one-point hemoglobin drop from 2-3 months ago.  Hyperglycemia without acidosis.  No leukocytosis or signs of sepsis.  CT abdomen/pelvis without evidence of diverticulitis or other intra-abdominal pathology.  As below, he demands discharge despite my recommendation for observation admission in the setting of his hemoglobin drop and concurrent anticoagulation.  He acknowledges risks of this and we will therefore discharge his request.  I provided information to follow-up with GI as an outpatient and we thoroughly discussed appropriate return precautions for the ED.  Clinical Course as of 09/30/21 0106  Wed Sep 30, 2021  0029 Reassessed the patient as nursing staff tells me that he is requesting discharge and "about to walk out of here."  I immediately go to the bedside and chat with the patient.  He reports he just wants to leave and should never called 911 today.  Reports he wants to go home where his medicines and his belongings are.  We discussed his work-up so far with a small hemoglobin drop but a reassuring CT and work-up otherwise.  He reports that he knows that it is just his external hemorrhoid bleeding and that there is nothing else wrong.  We discussed the possibility of other pathologies causing bleeding in addition to hemorrhoids, but he says this is probably not the case.  I recommended medical admission for observation and trending his hemoglobin but he declines.  He acknowledges the risks of worsening blood loss, intra-abdominal pathology, anemia, surgical pathology, death and other worsening disabilities.  Still wants to leave and is requesting EMS transport home.  We reviewed thorough return precautions and following up with GI as an outpatient.  He is agreeable.  He has capacity to make  this decision in my opinion. [DS]    Clinical Course User Index [DS] Vladimir Crofts, MD     FINAL CLINICAL IMPRESSION(S) / ED DIAGNOSES   Final diagnoses:  External hemorrhoid, bleeding  Anticoagulated     Rx / DC Orders   ED Discharge Orders     None        Note:  This document was prepared using Dragon voice recognition software and may include unintentional dictation errors.   Vladimir Crofts, MD 09/30/21 [phone]

## 2021-09-30 LAB — TYPE AND SCREEN
ABO/RH(D): O NEG
Antibody Screen: NEGATIVE

## 2021-09-30 LAB — HEPATIC FUNCTION PANEL
ALT: 20 U/L (ref 0–44)
AST: 26 U/L (ref 15–41)
Albumin: 3.3 g/dL — ABNORMAL LOW (ref 3.5–5.0)
Alkaline Phosphatase: 106 U/L (ref 38–126)
Bilirubin, Direct: <0.1 mg/dL (ref 0.0–0.2)
Total Bilirubin: 0.5 mg/dL (ref 0.3–1.2)
Total Protein: 6.7 g/dL (ref 6.5–8.1)

## 2021-09-30 MED ORDER — ONDANSETRON HCL 4 MG/2ML IJ SOLN
4.0000 mg | Freq: Once | INTRAMUSCULAR | Status: AC
  Administered 2021-09-30: 4 mg via INTRAVENOUS
  Filled 2021-09-30: qty 2

## 2021-09-30 NOTE — Discharge Instructions (Signed)
I have attached the information for local gastrointestinal (GI) doctor to reach out to to be seen in the clinic to discuss having another colonoscopy.  Because you are having this bleeding, even if it is probably your hemorrhoids, you need to have another colonoscopy performed.  If you develop any worsening bleeding, worsening pain, passing out, fever, please return to the ED.

## 2021-09-30 NOTE — ED Notes (Addendum)
Patient refuses to have EKG performed.

## 2021-09-30 NOTE — ED Notes (Signed)
Patient AOX4. Reports he wants to go home via EMS. States he does not have any friends or family that live in the area that could bring him home.

## 2022-02-11 ENCOUNTER — Ambulatory Visit: Payer: Medicare Other | Admitting: Physician Assistant

## 2022-03-08 ENCOUNTER — Ambulatory Visit: Payer: Medicare Other | Admitting: Physician Assistant

## 2022-03-09 ENCOUNTER — Ambulatory Visit: Admit: 2022-03-09 | Payer: Medicare Other | Admitting: Ophthalmology

## 2022-03-09 SURGERY — PHACOEMULSIFICATION, CATARACT, WITH IOL INSERTION
Anesthesia: Topical | Laterality: Right

## 2022-10-06 ENCOUNTER — Emergency Department
Admission: EM | Admit: 2022-10-06 | Discharge: 2022-10-06 | Disposition: A | Discharge status: Home / Self Care | Payer: 59 | Attending: Emergency Medicine | Admitting: Emergency Medicine

## 2022-10-06 ENCOUNTER — Encounter: Payer: Self-pay | Admitting: *Deleted

## 2022-10-06 ENCOUNTER — Other Ambulatory Visit: Payer: Self-pay

## 2022-10-06 DIAGNOSIS — K0889 Other specified disorders of teeth and supporting structures: Secondary | ICD-10-CM | POA: Insufficient documentation

## 2022-10-06 DIAGNOSIS — E119 Type 2 diabetes mellitus without complications: Secondary | ICD-10-CM | POA: Diagnosis not present

## 2022-10-06 DIAGNOSIS — I509 Heart failure, unspecified: Secondary | ICD-10-CM | POA: Insufficient documentation

## 2022-10-06 MED ORDER — OXYCODONE HCL 5 MG PO TABS
5.0000 mg | ORAL_TABLET | Freq: Once | ORAL | Status: AC
  Administered 2022-10-06: 5 mg via ORAL
  Filled 2022-10-06: qty 1

## 2022-10-06 MED ORDER — ACETAMINOPHEN 500 MG PO TABS
1000.0000 mg | ORAL_TABLET | Freq: Once | ORAL | Status: AC
  Administered 2022-10-06: 1000 mg via ORAL
  Filled 2022-10-06: qty 2

## 2022-10-06 MED ORDER — KETOROLAC TROMETHAMINE 30 MG/ML IJ SOLN
30.0000 mg | Freq: Once | INTRAMUSCULAR | Status: AC
  Administered 2022-10-06: 30 mg via INTRAMUSCULAR
  Filled 2022-10-06: qty 1

## 2022-10-06 MED ORDER — OXYCODONE HCL 5 MG PO TABS
5.0000 mg | ORAL_TABLET | Freq: Three times a day (TID) | ORAL | 0 refills | Status: DC | PRN
Start: 2022-10-06 — End: 2022-10-19

## 2022-10-06 MED ORDER — BUPIVACAINE-EPINEPHRINE (PF) 0.25% -1:200000 IJ SOLN
10.0000 mL | Freq: Once | INTRAMUSCULAR | Status: AC
  Administered 2022-10-06: 10 mL
  Filled 2022-10-06: qty 10

## 2022-10-06 NOTE — ED Provider Notes (Signed)
[facility] Provider Note    Event Date/Time   First MD Initiated Contact with Patient 10/06/22 0304     (approximate)   History   Dental Pain   HPI  SAWYER SUTHERBY is a 57 y.o. male   Past medical history of CHF and diabetes who presents to Emergency Department with dental pain after molar extraction on the upper right molar performed on Friday at his dentist office.  He denies fever.  His pain has been constant since the procedure.  He has been taking Tylenol and ibuprofen with minimal relief.    Physical Exam   Triage Vital Signs: ED Triage Vitals  Encounter Vitals Group     BP 10/06/22 0145 137/76     Systolic BP Percentile --      Diastolic BP Percentile --      Pulse Rate 10/06/22 0145 62     Resp 10/06/22 0145 (!) 22     Temp 10/06/22 0145 98.2 F (36.8 C)     Temp Source 10/06/22 0145 Oral     SpO2 10/06/22 0145 96 %     Weight 10/06/22 0143 (!) 370 lb (167.8 kg)     Height 10/06/22 0143 5\' 11"  (1.803 m)     Head Circumference --      Peak Flow --      Pain Score 10/06/22 0143 10     Pain Loc --      Pain Education --      Exclude from Growth Chart --     Most recent vital signs: Vitals:   10/06/22 0145  BP: 137/76  Pulse: 62  Resp: (!) 22  Temp: 98.2 F (36.8 C)  SpO2: 96%    General: Awake, no distress.  CV:  Good peripheral perfusion.  Resp:  Normal effort.  Abd:  No distention.  Other:  Overall well-appearing normal hemodynamics no fever.  No facial swelling noted.  No bleeding to the affected right upper molar extraction site, no purulence no abscess noted.   ED Results / Procedures / Treatments   Labs (all labs ordered are listed, but only abnormal results are displayed) Labs Reviewed - No data to display   PROCEDURES:  Critical Care performed: No  .Nerve Block  Date/Time: 10/06/2022 3:54 AM  Performed by: Pilar Jarvis, MD Authorized by: Pilar Jarvis, MD   Consent:    Consent obtained:  Verbal    Consent given by:  Patient   Risks, benefits, and alternatives were discussed: yes     Risks discussed:  Allergic reaction, infection, bleeding, intravenous injection, nerve damage, pain and unsuccessful block   Alternatives discussed:  No treatment, delayed treatment and alternative treatment Universal protocol:    Procedure explained and questions answered to patient or proxy's satisfaction: yes     Patient identity confirmed:  Verbally with patient Indications:    Indications:  Pain relief Location:    Body area:  Head   Head nerve blocked: post super alveolar.   Laterality:  Right Procedure details:    Block needle gauge:  25 G   Anesthetic injected:  Bupivacaine 0.25% WITH epi   Steroid injected:  None   Additive injected:  None   Injection procedure:  Anatomic landmarks identified, incremental injection, introduced needle, negative aspiration for blood and anatomic landmarks palpated Post-procedure details:    Dressing:  None   Outcome:  Anesthesia achieved   Procedure completion:  Tolerated    MEDICATIONS ORDERED IN ED: Medications  bupivacaine-epinephrine (PF) (MARCAINE W/ EPI) 0.25% -1:200000 injection 10 mL (10 mLs Infiltration Given 10/06/22 0352)  oxyCODONE (Oxy IR/ROXICODONE) immediate release tablet 5 mg (5 mg Oral Given 10/06/22 0352)    IMPRESSION / MDM / ASSESSMENT AND PLAN / ED COURSE  I reviewed the triage vital signs and the nursing notes.                                Patient's presentation is most consistent with acute complicated illness / injury requiring diagnostic workup.  Differential diagnosis includes, but is not limited to, dental pain, dental infection   The patient is on the cardiac monitor to evaluate for evidence of arrhythmia and/or significant heart rate changes.  MDM: No signs of infection, postprocedural pain, given oxycodone and a short prescription as well as bupivacaine nerve block in the emergency department tolerated well.   Discharged with dental follow-up.       FINAL CLINICAL IMPRESSION(S) / ED DIAGNOSES   Final diagnoses:  Pain, dental     Rx / DC Orders   ED Discharge Orders          Ordered    oxyCODONE (ROXICODONE) 5 MG immediate release tablet  Every 8 hours PRN        10/06/22 0333             Note:  This document was prepared using Dragon voice recognition software and may include unintentional dictation errors.    Pilar Jarvis, MD 10/06/22 [phone]

## 2022-10-06 NOTE — ED Triage Notes (Signed)
Pt arrives from home via [facility], per report pt had tooth extracted last Friday, severe pain for 2-3 days. 178/101, cbg 302, rr 18, hr 63, 98% ra. Has not had his diabetes medications today.

## 2022-10-06 NOTE — ED Notes (Signed)
called to [facility] for pt transport home/rep:shi

## 2022-10-06 NOTE — ED Triage Notes (Signed)
Pt report upper right molar extracted on Friday. Pt reports pain on the entire right side of his mouth. Used orajel, ibuprofen and aspirin for pain without relief. Denies fevers.

## 2022-10-06 NOTE — ED Notes (Signed)
Patient resting with eyes closed. Respirations even and unlabored. NAD noted. 

## 2022-10-12 ENCOUNTER — Emergency Department: Payer: 59

## 2022-10-12 ENCOUNTER — Other Ambulatory Visit: Payer: Self-pay

## 2022-10-12 ENCOUNTER — Inpatient Hospital Stay
Admission: EM | Admit: 2022-10-12 | Discharge: 2022-10-19 | DRG: 377 | Disposition: A | Discharge status: Home Health | Payer: 59 | Attending: Internal Medicine | Admitting: Internal Medicine

## 2022-10-12 DIAGNOSIS — G4730 Sleep apnea, unspecified: Secondary | ICD-10-CM | POA: Diagnosis present

## 2022-10-12 DIAGNOSIS — F418 Other specified anxiety disorders: Secondary | ICD-10-CM | POA: Diagnosis present

## 2022-10-12 DIAGNOSIS — F32A Depression, unspecified: Secondary | ICD-10-CM | POA: Diagnosis present

## 2022-10-12 DIAGNOSIS — J9601 Acute respiratory failure with hypoxia: Secondary | ICD-10-CM

## 2022-10-12 DIAGNOSIS — Z79899 Other long term (current) drug therapy: Secondary | ICD-10-CM

## 2022-10-12 DIAGNOSIS — R1013 Epigastric pain: Principal | ICD-10-CM

## 2022-10-12 DIAGNOSIS — Z6841 Body Mass Index (BMI) 40.0 and over, adult: Secondary | ICD-10-CM

## 2022-10-12 DIAGNOSIS — K5909 Other constipation: Secondary | ICD-10-CM | POA: Diagnosis present

## 2022-10-12 DIAGNOSIS — R338 Other retention of urine: Secondary | ICD-10-CM

## 2022-10-12 DIAGNOSIS — E872 Acidosis, unspecified: Secondary | ICD-10-CM | POA: Diagnosis present

## 2022-10-12 DIAGNOSIS — Z5982 Transportation insecurity: Secondary | ICD-10-CM

## 2022-10-12 DIAGNOSIS — R339 Retention of urine, unspecified: Secondary | ICD-10-CM | POA: Diagnosis not present

## 2022-10-12 DIAGNOSIS — I872 Venous insufficiency (chronic) (peripheral): Secondary | ICD-10-CM | POA: Diagnosis present

## 2022-10-12 DIAGNOSIS — Z888 Allergy status to other drugs, medicaments and biological substances status: Secondary | ICD-10-CM

## 2022-10-12 DIAGNOSIS — Z993 Dependence on wheelchair: Secondary | ICD-10-CM

## 2022-10-12 DIAGNOSIS — N184 Chronic kidney disease, stage 4 (severe): Secondary | ICD-10-CM | POA: Diagnosis present

## 2022-10-12 DIAGNOSIS — N179 Acute kidney failure, unspecified: Secondary | ICD-10-CM

## 2022-10-12 DIAGNOSIS — K625 Hemorrhage of anus and rectum: Secondary | ICD-10-CM | POA: Diagnosis present

## 2022-10-12 DIAGNOSIS — Z1152 Encounter for screening for COVID-19: Secondary | ICD-10-CM

## 2022-10-12 DIAGNOSIS — K922 Gastrointestinal hemorrhage, unspecified: Secondary | ICD-10-CM

## 2022-10-12 DIAGNOSIS — Z89511 Acquired absence of right leg below knee: Secondary | ICD-10-CM

## 2022-10-12 DIAGNOSIS — D72829 Elevated white blood cell count, unspecified: Secondary | ICD-10-CM | POA: Diagnosis present

## 2022-10-12 DIAGNOSIS — F607 Dependent personality disorder: Secondary | ICD-10-CM | POA: Diagnosis present

## 2022-10-12 DIAGNOSIS — M5431 Sciatica, right side: Secondary | ICD-10-CM

## 2022-10-12 DIAGNOSIS — E1165 Type 2 diabetes mellitus with hyperglycemia: Secondary | ICD-10-CM | POA: Diagnosis present

## 2022-10-12 DIAGNOSIS — Z882 Allergy status to sulfonamides status: Secondary | ICD-10-CM

## 2022-10-12 DIAGNOSIS — L089 Local infection of the skin and subcutaneous tissue, unspecified: Secondary | ICD-10-CM | POA: Diagnosis present

## 2022-10-12 DIAGNOSIS — E875 Hyperkalemia: Secondary | ICD-10-CM | POA: Diagnosis present

## 2022-10-12 DIAGNOSIS — Z8249 Family history of ischemic heart disease and other diseases of the circulatory system: Secondary | ICD-10-CM

## 2022-10-12 DIAGNOSIS — D62 Acute posthemorrhagic anemia: Secondary | ICD-10-CM | POA: Diagnosis present

## 2022-10-12 DIAGNOSIS — K640 First degree hemorrhoids: Secondary | ICD-10-CM | POA: Diagnosis present

## 2022-10-12 DIAGNOSIS — L97829 Non-pressure chronic ulcer of other part of left lower leg with unspecified severity: Secondary | ICD-10-CM | POA: Diagnosis present

## 2022-10-12 DIAGNOSIS — R571 Hypovolemic shock: Secondary | ICD-10-CM | POA: Diagnosis not present

## 2022-10-12 DIAGNOSIS — E1122 Type 2 diabetes mellitus with diabetic chronic kidney disease: Secondary | ICD-10-CM | POA: Diagnosis present

## 2022-10-12 DIAGNOSIS — I5032 Chronic diastolic (congestive) heart failure: Secondary | ICD-10-CM | POA: Diagnosis present

## 2022-10-12 DIAGNOSIS — Z825 Family history of asthma and other chronic lower respiratory diseases: Secondary | ICD-10-CM

## 2022-10-12 DIAGNOSIS — K2971 Gastritis, unspecified, with bleeding: Secondary | ICD-10-CM | POA: Diagnosis present

## 2022-10-12 DIAGNOSIS — E860 Dehydration: Secondary | ICD-10-CM | POA: Diagnosis present

## 2022-10-12 DIAGNOSIS — K259 Gastric ulcer, unspecified as acute or chronic, without hemorrhage or perforation: Secondary | ICD-10-CM

## 2022-10-12 DIAGNOSIS — I130 Hypertensive heart and chronic kidney disease with heart failure and stage 1 through stage 4 chronic kidney disease, or unspecified chronic kidney disease: Secondary | ICD-10-CM | POA: Diagnosis present

## 2022-10-12 DIAGNOSIS — K254 Chronic or unspecified gastric ulcer with hemorrhage: Secondary | ICD-10-CM | POA: Diagnosis not present

## 2022-10-12 DIAGNOSIS — F419 Anxiety disorder, unspecified: Secondary | ICD-10-CM | POA: Diagnosis present

## 2022-10-12 DIAGNOSIS — Z91013 Allergy to seafood: Secondary | ICD-10-CM

## 2022-10-12 DIAGNOSIS — Z8051 Family history of malignant neoplasm of kidney: Secondary | ICD-10-CM

## 2022-10-12 DIAGNOSIS — Z794 Long term (current) use of insulin: Secondary | ICD-10-CM

## 2022-10-12 DIAGNOSIS — I4892 Unspecified atrial flutter: Secondary | ICD-10-CM | POA: Diagnosis present

## 2022-10-12 DIAGNOSIS — E785 Hyperlipidemia, unspecified: Secondary | ICD-10-CM | POA: Diagnosis present

## 2022-10-12 DIAGNOSIS — E782 Mixed hyperlipidemia: Secondary | ICD-10-CM | POA: Diagnosis present

## 2022-10-12 DIAGNOSIS — R7989 Other specified abnormal findings of blood chemistry: Secondary | ICD-10-CM | POA: Diagnosis present

## 2022-10-12 DIAGNOSIS — Z833 Family history of diabetes mellitus: Secondary | ICD-10-CM

## 2022-10-12 DIAGNOSIS — I5033 Acute on chronic diastolic (congestive) heart failure: Secondary | ICD-10-CM | POA: Diagnosis present

## 2022-10-12 DIAGNOSIS — Z881 Allergy status to other antibiotic agents status: Secondary | ICD-10-CM

## 2022-10-12 DIAGNOSIS — E1100 Type 2 diabetes mellitus with hyperosmolarity without nonketotic hyperglycemic-hyperosmolar coma (NKHHC): Secondary | ICD-10-CM | POA: Diagnosis present

## 2022-10-12 LAB — CBC WITH DIFFERENTIAL/PLATELET
Abs Immature Granulocytes: 0.07 10*3/uL (ref 0.00–0.07)
Basophils Absolute: 0 10*3/uL (ref 0.0–0.1)
Basophils Relative: 0 %
Eosinophils Absolute: 0.3 10*3/uL (ref 0.0–0.5)
Eosinophils Relative: 2 %
HCT: 35.7 % — ABNORMAL LOW (ref 39.0–52.0)
Hemoglobin: 11.2 g/dL — ABNORMAL LOW (ref 13.0–17.0)
Immature Granulocytes: 1 %
Lymphocytes Relative: 11 %
Lymphs Abs: 1.2 10*3/uL (ref 0.7–4.0)
MCH: 25.7 pg — ABNORMAL LOW (ref 26.0–34.0)
MCHC: 31.4 g/dL (ref 30.0–36.0)
MCV: 82.1 fL (ref 80.0–100.0)
Monocytes Absolute: 0.5 10*3/uL (ref 0.1–1.0)
Monocytes Relative: 5 %
Neutro Abs: 8.6 10*3/uL — ABNORMAL HIGH (ref 1.7–7.7)
Neutrophils Relative %: 81 %
Platelets: 244 10*3/uL (ref 150–400)
RBC: 4.35 MIL/uL (ref 4.22–5.81)
RDW: 14.2 % (ref 11.5–15.5)
WBC: 10.7 10*3/uL — ABNORMAL HIGH (ref 4.0–10.5)
nRBC: 0 % (ref 0.0–0.2)

## 2022-10-12 LAB — COMPREHENSIVE METABOLIC PANEL
ALT: 16 U/L (ref 0–44)
AST: 18 U/L (ref 15–41)
Albumin: 2.8 g/dL — ABNORMAL LOW (ref 3.5–5.0)
Alkaline Phosphatase: 97 U/L (ref 38–126)
Anion gap: 9 (ref 5–15)
BUN: 27 mg/dL — ABNORMAL HIGH (ref 6–20)
CO2: 25 mmol/L (ref 22–32)
Calcium: 8.3 mg/dL — ABNORMAL LOW (ref 8.9–10.3)
Chloride: 95 mmol/L — ABNORMAL LOW (ref 98–111)
Creatinine, Ser: 1.14 mg/dL (ref 0.61–1.24)
GFR, Estimated: >60 mL/min (ref >60)
Glucose, Bld: 600 mg/dL (ref 70–99)
Potassium: 7.2 mmol/L (ref 3.5–5.1)
Sodium: 129 mmol/L — ABNORMAL LOW (ref 135–145)
Total Bilirubin: 0.7 mg/dL (ref 0.3–1.2)
Total Protein: 6.3 g/dL — ABNORMAL LOW (ref 6.5–8.1)

## 2022-10-12 LAB — LIPASE, BLOOD: Lipase: 24 U/L (ref 11–51)

## 2022-10-12 LAB — LACTIC ACID, PLASMA: Lactic Acid, Venous: 2.6 mmol/L (ref 0.5–1.9)

## 2022-10-12 LAB — BRAIN NATRIURETIC PEPTIDE: B Natriuretic Peptide: 47 pg/mL (ref 0.0–100.0)

## 2022-10-12 LAB — ETHANOL: Alcohol, Ethyl (B): <10 mg/dL (ref <10)

## 2022-10-12 LAB — TROPONIN I (HIGH SENSITIVITY): Troponin I (High Sensitivity): 12 ng/L (ref <18)

## 2022-10-12 LAB — SARS CORONAVIRUS 2 BY RT PCR: SARS Coronavirus 2 by RT PCR: NEGATIVE

## 2022-10-12 MED ORDER — PANTOPRAZOLE SODIUM 40 MG IV SOLR
40.0000 mg | Freq: Once | INTRAVENOUS | Status: AC
  Administered 2022-10-12: 40 mg via INTRAVENOUS
  Filled 2022-10-12: qty 10

## 2022-10-12 MED ORDER — ONDANSETRON HCL 4 MG/2ML IJ SOLN
4.0000 mg | Freq: Once | INTRAMUSCULAR | Status: AC
  Administered 2022-10-12: 4 mg via INTRAVENOUS
  Filled 2022-10-12: qty 2

## 2022-10-12 MED ORDER — MORPHINE SULFATE (PF) 4 MG/ML IV SOLN
4.0000 mg | Freq: Once | INTRAVENOUS | Status: AC
  Administered 2022-10-12: 4 mg via INTRAVENOUS
  Filled 2022-10-12: qty 1

## 2022-10-12 MED ORDER — HYDROMORPHONE HCL 1 MG/ML IJ SOLN
1.0000 mg | Freq: Once | INTRAMUSCULAR | Status: AC
  Administered 2022-10-13: 1 mg via INTRAVENOUS
  Filled 2022-10-12: qty 1

## 2022-10-12 MED ORDER — INSULIN ASPART 100 UNIT/ML IV SOLN
10.0000 [IU] | Freq: Once | INTRAVENOUS | Status: AC
  Administered 2022-10-13: 10 [IU] via INTRAVENOUS
  Filled 2022-10-12: qty 0.1

## 2022-10-12 MED ORDER — CALCIUM GLUCONATE 10 % IV SOLN
1.0000 g | Freq: Once | INTRAVENOUS | Status: AC
  Administered 2022-10-13: 1 g via INTRAVENOUS
  Filled 2022-10-12: qty 10

## 2022-10-12 MED ORDER — SODIUM ZIRCONIUM CYCLOSILICATE 10 G PO PACK
10.0000 g | PACK | Freq: Once | ORAL | Status: AC
  Administered 2022-10-13: 10 g via ORAL
  Filled 2022-10-12: qty 1

## 2022-10-12 MED ORDER — IOHEXOL 350 MG/ML SOLN
100.0000 mL | Freq: Once | INTRAVENOUS | Status: AC | PRN
  Administered 2022-10-13: 100 mL via INTRAVENOUS

## 2022-10-12 MED ORDER — SODIUM CHLORIDE 0.9 % IV BOLUS
1000.0000 mL | Freq: Once | INTRAVENOUS | Status: AC
  Administered 2022-10-12: 1000 mL via INTRAVENOUS

## 2022-10-12 MED ORDER — SODIUM CHLORIDE 0.9 % IV BOLUS
500.0000 mL | Freq: Once | INTRAVENOUS | Status: AC
  Administered 2022-10-13: 500 mL via INTRAVENOUS

## 2022-10-12 MED ORDER — FUROSEMIDE 10 MG/ML IJ SOLN
40.0000 mg | Freq: Once | INTRAMUSCULAR | Status: AC
  Administered 2022-10-13: 40 mg via INTRAVENOUS
  Filled 2022-10-12: qty 4

## 2022-10-12 NOTE — ED Triage Notes (Signed)
BIB medic for mid RUQ abdominal pain after taking milk of magnesia d/t being constipated x 2 days. Patient took med at 1600 and has had 1 episode of diarrhea.

## 2022-10-12 NOTE — ED Notes (Signed)
Pt assisted in the bathroom. As pt was standing to get off of the toilet, this tech noticed a large amount of bright red blood in the toilet. MD Scotty Court was called to come in the bathroom. Pt cleaned with warm peri wipes and assisted back to wheelchair. Pt wheeled back to rm and assisted back to bed. Pt connected back to the cardiac monitor. Pt has no further needs at this time.

## 2022-10-12 NOTE — ED Provider Notes (Signed)
[facility] Provider Note    Event Date/Time   First MD Initiated Contact with Patient 10/12/22 2114     (approximate)   History   Chief Complaint: Abdominal Pain (BIB medic for mid RUQ abdominal pain after taking milk of magnesia d/t being constipated x 2 days. Patient took med at 1600 and has had 1 episode of diarrhea.)   HPI  Russell [name] is a 57 y.o. male with a history of diabetes, hypertension, morbid obesity, diastolic heart failure who comes to the ED complaining of epigastric pain for the past 2 days associated with constipation.  Took milk of magnesia earlier today which caused a bowel movement, but now is having even more severe pain.  Denies chest pain or shortness of breath, no fever.  Denies black or bloody stool.     Physical Exam   Triage Vital Signs: ED Triage Vitals  Encounter Vitals Group     BP 10/12/22 2114 124/76     Systolic BP Percentile --      Diastolic BP Percentile --      Pulse Rate 10/12/22 2114 77     Resp 10/12/22 2114 (!) 24     Temp 10/12/22 2114 98.2 F (36.8 C)     Temp Source 10/12/22 2114 Oral     SpO2 10/12/22 2111 93 %     Weight 10/12/22 2111 (!) 376 lb (170.6 kg)     Height 10/12/22 2111 5\' 11"  (1.803 m)     Head Circumference --      Peak Flow --      Pain Score --      Pain Loc --      Pain Education --      Exclude from Growth Chart --     Most recent vital signs: Vitals:   10/12/22 2111 10/12/22 2114  BP:  124/76  Pulse:  77  Resp:  (!) 24  Temp:  98.2 F (36.8 C)  SpO2: 93% 98%    General: Awake, no distress.  Ill-appearing CV:  Good peripheral perfusion.  Regular rate and rhythm.  Normal distal pulses. Resp:  Normal effort.  Tachypneic with respiratory rate of 25.  Lungs clear to auscultation bilaterally, no wheezing or crackles Abd:  No distention.  There is a small umbilical hernia which is soft, nontender, easily reducible.  There is a 4 cm subcutaneous mass in the epigastrium  which is firm, nontender, not reducible.  Patient also has large external hemorrhoids with raw mucosa and some adherent clot suggestive of recent bleeding. Other:  1+ edema bilateral lower extremities, symmetric.  No calf tenderness.   ED Results / Procedures / Treatments   Labs (all labs ordered are listed, but only abnormal results are displayed) Labs Reviewed  CBC WITH DIFFERENTIAL/PLATELET - Abnormal; Notable for the following components:      Result Value   WBC 10.7 (*)    Hemoglobin 11.2 (*)    HCT 35.7 (*)    MCH 25.7 (*)    Neutro Abs 8.6 (*)    All other components within normal limits  SARS CORONAVIRUS 2 BY RT PCR  COMPREHENSIVE METABOLIC PANEL  ETHANOL  LIPASE, BLOOD  LACTIC ACID, PLASMA  LACTIC ACID, PLASMA  BRAIN NATRIURETIC PEPTIDE  PROCALCITONIN  TROPONIN I (HIGH SENSITIVITY)     EKG Interpreted by me Sinus rhythm rate of 77.  Right axis, normal intervals.  Normal QRS ST segments and T waves.  No ischemic changes.  RADIOLOGY Chest x-ray interpreted by me, appears normal.  Radiology report reviewed   PROCEDURES:  Procedures   MEDICATIONS ORDERED IN ED: Medications  pantoprazole (PROTONIX) injection 40 mg (has no administration in time range)  sodium chloride 0.9 % bolus 1,000 mL (1,000 mLs Intravenous New Bag/Given 10/12/22 2252)  morphine (PF) 4 MG/ML injection 4 mg (4 mg Intravenous Given 10/12/22 2243)  ondansetron (ZOFRAN) injection 4 mg (4 mg Intravenous Given 10/12/22 2234)     IMPRESSION / MDM / ASSESSMENT AND PLAN / ED COURSE  I reviewed the triage vital signs and the nursing notes.  DDx: Pancreatitis, gastritis, pancreatic mass, incarcerated hernia, acute GI bleed, GI perforation, diverticulitis  Patient's presentation is most consistent with acute presentation with potential threat to life or bodily function.  Patient presents with abdominal pain.  Shortly after arrival, patient was assisted to the bathroom where he produced a runny,  grossly bloody (non-melanotic) bowel movement.  The volume and dark maroon color appears unlikely to be hemorrhoids.  Will need to check labs, obtain chest x-ray and CT abdomen pelvis.  Will need to admit.  Will order Protonix.  Patient denies any acute chest pain or shortness of breath.  I think his tachypnea is due to body habitus with a BMI of 52, and is most likely baseline.  Will evaluate with a chest x-ray.   ----------------------------------------- 11:03 PM on 10/12/2022 ----------------------------------------- Still awaiting labs and CT.      FINAL CLINICAL IMPRESSION(S) / ED DIAGNOSES   Final diagnoses:  Epigastric pain  Acute lower GI bleeding  Morbid obesity (HCC)     Rx / DC Orders   ED Discharge Orders     None        Note:  This document was prepared using Dragon voice recognition software and may include unintentional dictation errors.   Sharman Cheek, MD 10/12/22 2303

## 2022-10-13 ENCOUNTER — Emergency Department: Payer: 59

## 2022-10-13 ENCOUNTER — Inpatient Hospital Stay: Payer: 59

## 2022-10-13 DIAGNOSIS — D62 Acute posthemorrhagic anemia: Secondary | ICD-10-CM | POA: Diagnosis present

## 2022-10-13 DIAGNOSIS — I5032 Chronic diastolic (congestive) heart failure: Secondary | ICD-10-CM

## 2022-10-13 DIAGNOSIS — E1165 Type 2 diabetes mellitus with hyperglycemia: Secondary | ICD-10-CM | POA: Diagnosis present

## 2022-10-13 DIAGNOSIS — I5033 Acute on chronic diastolic (congestive) heart failure: Secondary | ICD-10-CM | POA: Diagnosis present

## 2022-10-13 DIAGNOSIS — R338 Other retention of urine: Secondary | ICD-10-CM | POA: Diagnosis not present

## 2022-10-13 DIAGNOSIS — E1122 Type 2 diabetes mellitus with diabetic chronic kidney disease: Secondary | ICD-10-CM | POA: Diagnosis present

## 2022-10-13 DIAGNOSIS — N179 Acute kidney failure, unspecified: Secondary | ICD-10-CM | POA: Diagnosis present

## 2022-10-13 DIAGNOSIS — Z89511 Acquired absence of right leg below knee: Secondary | ICD-10-CM | POA: Diagnosis not present

## 2022-10-13 DIAGNOSIS — Z6841 Body Mass Index (BMI) 40.0 and over, adult: Secondary | ICD-10-CM | POA: Diagnosis not present

## 2022-10-13 DIAGNOSIS — E785 Hyperlipidemia, unspecified: Secondary | ICD-10-CM | POA: Diagnosis present

## 2022-10-13 DIAGNOSIS — N184 Chronic kidney disease, stage 4 (severe): Secondary | ICD-10-CM | POA: Diagnosis present

## 2022-10-13 DIAGNOSIS — K922 Gastrointestinal hemorrhage, unspecified: Secondary | ICD-10-CM

## 2022-10-13 DIAGNOSIS — L089 Local infection of the skin and subcutaneous tissue, unspecified: Secondary | ICD-10-CM | POA: Diagnosis present

## 2022-10-13 DIAGNOSIS — K625 Hemorrhage of anus and rectum: Secondary | ICD-10-CM | POA: Diagnosis present

## 2022-10-13 DIAGNOSIS — F418 Other specified anxiety disorders: Secondary | ICD-10-CM | POA: Diagnosis present

## 2022-10-13 DIAGNOSIS — L97829 Non-pressure chronic ulcer of other part of left lower leg with unspecified severity: Secondary | ICD-10-CM | POA: Diagnosis present

## 2022-10-13 DIAGNOSIS — E872 Acidosis, unspecified: Secondary | ICD-10-CM | POA: Diagnosis present

## 2022-10-13 DIAGNOSIS — K259 Gastric ulcer, unspecified as acute or chronic, without hemorrhage or perforation: Secondary | ICD-10-CM | POA: Diagnosis not present

## 2022-10-13 DIAGNOSIS — E875 Hyperkalemia: Secondary | ICD-10-CM | POA: Diagnosis present

## 2022-10-13 DIAGNOSIS — R571 Hypovolemic shock: Secondary | ICD-10-CM | POA: Diagnosis present

## 2022-10-13 DIAGNOSIS — I130 Hypertensive heart and chronic kidney disease with heart failure and stage 1 through stage 4 chronic kidney disease, or unspecified chronic kidney disease: Secondary | ICD-10-CM | POA: Diagnosis present

## 2022-10-13 DIAGNOSIS — Z794 Long term (current) use of insulin: Secondary | ICD-10-CM | POA: Diagnosis not present

## 2022-10-13 DIAGNOSIS — R1013 Epigastric pain: Secondary | ICD-10-CM

## 2022-10-13 DIAGNOSIS — R339 Retention of urine, unspecified: Secondary | ICD-10-CM | POA: Diagnosis not present

## 2022-10-13 DIAGNOSIS — K640 First degree hemorrhoids: Secondary | ICD-10-CM | POA: Diagnosis not present

## 2022-10-13 DIAGNOSIS — M5431 Sciatica, right side: Secondary | ICD-10-CM

## 2022-10-13 DIAGNOSIS — F607 Dependent personality disorder: Secondary | ICD-10-CM | POA: Diagnosis present

## 2022-10-13 DIAGNOSIS — F32A Depression, unspecified: Secondary | ICD-10-CM | POA: Diagnosis present

## 2022-10-13 DIAGNOSIS — Z1152 Encounter for screening for COVID-19: Secondary | ICD-10-CM | POA: Diagnosis not present

## 2022-10-13 DIAGNOSIS — I4892 Unspecified atrial flutter: Secondary | ICD-10-CM | POA: Diagnosis present

## 2022-10-13 DIAGNOSIS — K254 Chronic or unspecified gastric ulcer with hemorrhage: Secondary | ICD-10-CM | POA: Diagnosis present

## 2022-10-13 DIAGNOSIS — K2971 Gastritis, unspecified, with bleeding: Secondary | ICD-10-CM | POA: Diagnosis present

## 2022-10-13 DIAGNOSIS — D72829 Elevated white blood cell count, unspecified: Secondary | ICD-10-CM | POA: Diagnosis present

## 2022-10-13 DIAGNOSIS — E1100 Type 2 diabetes mellitus with hyperosmolarity without nonketotic hyperglycemic-hyperosmolar coma (NKHHC): Secondary | ICD-10-CM | POA: Diagnosis present

## 2022-10-13 DIAGNOSIS — R7989 Other specified abnormal findings of blood chemistry: Secondary | ICD-10-CM | POA: Diagnosis present

## 2022-10-13 DIAGNOSIS — J9601 Acute respiratory failure with hypoxia: Secondary | ICD-10-CM | POA: Diagnosis not present

## 2022-10-13 DIAGNOSIS — K921 Melena: Secondary | ICD-10-CM | POA: Diagnosis not present

## 2022-10-13 LAB — BASIC METABOLIC PANEL
Anion gap: 8 (ref 5–15)
Anion gap: 8 (ref 5–15)
Anion gap: 10 (ref 5–15)
Anion gap: 6 (ref 5–15)
BUN: 32 mg/dL — ABNORMAL HIGH (ref 6–20)
BUN: 36 mg/dL — ABNORMAL HIGH (ref 6–20)
BUN: 39 mg/dL — ABNORMAL HIGH (ref 6–20)
BUN: 39 mg/dL — ABNORMAL HIGH (ref 6–20)
CO2: 25 mmol/L (ref 22–32)
CO2: 21 mmol/L — ABNORMAL LOW (ref 22–32)
CO2: 20 mmol/L — ABNORMAL LOW (ref 22–32)
CO2: 25 mmol/L (ref 22–32)
Calcium: 7.9 mg/dL — ABNORMAL LOW (ref 8.9–10.3)
Calcium: 8.1 mg/dL — ABNORMAL LOW (ref 8.9–10.3)
Calcium: 8 mg/dL — ABNORMAL LOW (ref 8.9–10.3)
Calcium: 7.8 mg/dL — ABNORMAL LOW (ref 8.9–10.3)
Chloride: 99 mmol/L (ref 98–111)
Chloride: 103 mmol/L (ref 98–111)
Chloride: 104 mmol/L (ref 98–111)
Chloride: 103 mmol/L (ref 98–111)
Creatinine, Ser: 1.33 mg/dL — ABNORMAL HIGH (ref 0.61–1.24)
Creatinine, Ser: 1.6 mg/dL — ABNORMAL HIGH (ref 0.61–1.24)
Creatinine, Ser: 1.38 mg/dL — ABNORMAL HIGH (ref 0.61–1.24)
Creatinine, Ser: 1.29 mg/dL — ABNORMAL HIGH (ref 0.61–1.24)
GFR, Estimated: >60 mL/min (ref >60)
GFR, Estimated: 50 mL/min — ABNORMAL LOW (ref >60)
GFR, Estimated: 59 mL/min — ABNORMAL LOW (ref >60)
GFR, Estimated: >60 mL/min (ref >60)
Glucose, Bld: 566 mg/dL (ref 70–99)
Glucose, Bld: 260 mg/dL — ABNORMAL HIGH (ref 70–99)
Glucose, Bld: 225 mg/dL — ABNORMAL HIGH (ref 70–99)
Glucose, Bld: 205 mg/dL — ABNORMAL HIGH (ref 70–99)
Potassium: 6 mmol/L — ABNORMAL HIGH (ref 3.5–5.1)
Potassium: 6.2 mmol/L — ABNORMAL HIGH (ref 3.5–5.1)
Potassium: 6.1 mmol/L — ABNORMAL HIGH (ref 3.5–5.1)
Potassium: 5.2 mmol/L — ABNORMAL HIGH (ref 3.5–5.1)
Sodium: 132 mmol/L — ABNORMAL LOW (ref 135–145)
Sodium: 132 mmol/L — ABNORMAL LOW (ref 135–145)
Sodium: 134 mmol/L — ABNORMAL LOW (ref 135–145)
Sodium: 134 mmol/L — ABNORMAL LOW (ref 135–145)

## 2022-10-13 LAB — APTT: aPTT: 28 seconds (ref 24–36)

## 2022-10-13 LAB — PROCALCITONIN: Procalcitonin: <0.1 ng/mL

## 2022-10-13 LAB — COMPREHENSIVE METABOLIC PANEL
ALT: 12 U/L (ref 0–44)
AST: 16 U/L (ref 15–41)
Albumin: 2.3 g/dL — ABNORMAL LOW (ref 3.5–5.0)
Alkaline Phosphatase: 70 U/L (ref 38–126)
Anion gap: 6 (ref 5–15)
BUN: 35 mg/dL — ABNORMAL HIGH (ref 6–20)
CO2: 24 mmol/L (ref 22–32)
Calcium: 7.8 mg/dL — ABNORMAL LOW (ref 8.9–10.3)
Chloride: 101 mmol/L (ref 98–111)
Creatinine, Ser: 1.46 mg/dL — ABNORMAL HIGH (ref 0.61–1.24)
GFR, Estimated: 55 mL/min — ABNORMAL LOW (ref >60)
Glucose, Bld: 399 mg/dL — ABNORMAL HIGH (ref 70–99)
Potassium: 5.3 mmol/L — ABNORMAL HIGH (ref 3.5–5.1)
Sodium: 131 mmol/L — ABNORMAL LOW (ref 135–145)
Total Bilirubin: 0.1 mg/dL — ABNORMAL LOW (ref 0.3–1.2)
Total Protein: 5.3 g/dL — ABNORMAL LOW (ref 6.5–8.1)

## 2022-10-13 LAB — GLUCOSE, CAPILLARY
Glucose-Capillary: 133 mg/dL — ABNORMAL HIGH (ref 70–99)
Glucose-Capillary: 147 mg/dL — ABNORMAL HIGH (ref 70–99)
Glucose-Capillary: 150 mg/dL — ABNORMAL HIGH (ref 70–99)
Glucose-Capillary: 165 mg/dL — ABNORMAL HIGH (ref 70–99)
Glucose-Capillary: 178 mg/dL — ABNORMAL HIGH (ref 70–99)
Glucose-Capillary: 180 mg/dL — ABNORMAL HIGH (ref 70–99)
Glucose-Capillary: 181 mg/dL — ABNORMAL HIGH (ref 70–99)
Glucose-Capillary: 181 mg/dL — ABNORMAL HIGH (ref 70–99)
Glucose-Capillary: 182 mg/dL — ABNORMAL HIGH (ref 70–99)
Glucose-Capillary: 185 mg/dL — ABNORMAL HIGH (ref 70–99)
Glucose-Capillary: 188 mg/dL — ABNORMAL HIGH (ref 70–99)
Glucose-Capillary: 188 mg/dL — ABNORMAL HIGH (ref 70–99)
Glucose-Capillary: 195 mg/dL — ABNORMAL HIGH (ref 70–99)
Glucose-Capillary: 197 mg/dL — ABNORMAL HIGH (ref 70–99)
Glucose-Capillary: 200 mg/dL — ABNORMAL HIGH (ref 70–99)
Glucose-Capillary: 232 mg/dL — ABNORMAL HIGH (ref 70–99)
Glucose-Capillary: 240 mg/dL — ABNORMAL HIGH (ref 70–99)
Glucose-Capillary: 344 mg/dL — ABNORMAL HIGH (ref 70–99)
Glucose-Capillary: 351 mg/dL — ABNORMAL HIGH (ref 70–99)
Glucose-Capillary: 439 mg/dL — ABNORMAL HIGH (ref 70–99)
Glucose-Capillary: 453 mg/dL — ABNORMAL HIGH (ref 70–99)
Glucose-Capillary: 487 mg/dL — ABNORMAL HIGH (ref 70–99)
Glucose-Capillary: 488 mg/dL — ABNORMAL HIGH (ref 70–99)
Glucose-Capillary: 571 mg/dL (ref 70–99)

## 2022-10-13 LAB — CBC
HCT: 28.7 % — ABNORMAL LOW (ref 39.0–52.0)
HCT: 25.9 % — ABNORMAL LOW (ref 39.0–52.0)
HCT: 30.4 % — ABNORMAL LOW (ref 39.0–52.0)
HCT: 23.8 % — ABNORMAL LOW (ref 39.0–52.0)
Hemoglobin: 9 g/dL — ABNORMAL LOW (ref 13.0–17.0)
Hemoglobin: 8.3 g/dL — ABNORMAL LOW (ref 13.0–17.0)
Hemoglobin: 9.8 g/dL — ABNORMAL LOW (ref 13.0–17.0)
Hemoglobin: 7.9 g/dL — ABNORMAL LOW (ref 13.0–17.0)
MCH: 25.9 pg — ABNORMAL LOW (ref 26.0–34.0)
MCH: 25.7 pg — ABNORMAL LOW (ref 26.0–34.0)
MCH: 26.5 pg (ref 26.0–34.0)
MCH: 27.1 pg (ref 26.0–34.0)
MCHC: 31.4 g/dL (ref 30.0–36.0)
MCHC: 32 g/dL (ref 30.0–36.0)
MCHC: 32.2 g/dL (ref 30.0–36.0)
MCHC: 33.2 g/dL (ref 30.0–36.0)
MCV: 82.5 fL (ref 80.0–100.0)
MCV: 80.2 fL (ref 80.0–100.0)
MCV: 82.2 fL (ref 80.0–100.0)
MCV: 81.8 fL (ref 80.0–100.0)
Platelets: 228 10*3/uL (ref 150–400)
Platelets: 236 10*3/uL (ref 150–400)
Platelets: 255 10*3/uL (ref 150–400)
Platelets: 188 10*3/uL (ref 150–400)
RBC: 3.48 MIL/uL — ABNORMAL LOW (ref 4.22–5.81)
RBC: 3.23 MIL/uL — ABNORMAL LOW (ref 4.22–5.81)
RBC: 3.7 MIL/uL — ABNORMAL LOW (ref 4.22–5.81)
RBC: 2.91 MIL/uL — ABNORMAL LOW (ref 4.22–5.81)
RDW: 14.3 % (ref 11.5–15.5)
RDW: 14.3 % (ref 11.5–15.5)
RDW: 14.8 % (ref 11.5–15.5)
RDW: 14.6 % (ref 11.5–15.5)
WBC: 8.6 10*3/uL (ref 4.0–10.5)
WBC: 10.7 10*3/uL — ABNORMAL HIGH (ref 4.0–10.5)
WBC: 14.7 10*3/uL — ABNORMAL HIGH (ref 4.0–10.5)
WBC: 9.6 10*3/uL (ref 4.0–10.5)
nRBC: 0 % (ref 0.0–0.2)
nRBC: 0 % (ref 0.0–0.2)
nRBC: 0 % (ref 0.0–0.2)
nRBC: 0 % (ref 0.0–0.2)

## 2022-10-13 LAB — CBG MONITORING, ED
Glucose-Capillary: 495 mg/dL — ABNORMAL HIGH (ref 70–99)
Glucose-Capillary: 264 mg/dL — ABNORMAL HIGH (ref 70–99)

## 2022-10-13 LAB — HIV ANTIBODY (ROUTINE TESTING W REFLEX): HIV Screen 4th Generation wRfx: NONREACTIVE

## 2022-10-13 LAB — PHOSPHORUS: Phosphorus: 2.7 mg/dL (ref 2.5–4.6)

## 2022-10-13 LAB — OSMOLALITY: Osmolality: 322 mOsm/kg (ref 275–295)

## 2022-10-13 LAB — HEMOGLOBIN: Hemoglobin: 7.9 g/dL — ABNORMAL LOW (ref 13.0–17.0)

## 2022-10-13 LAB — PROTIME-INR
INR: 1.2 (ref 0.8–1.2)
Prothrombin Time: 15.8 seconds — ABNORMAL HIGH (ref 11.4–15.2)

## 2022-10-13 LAB — LACTIC ACID, PLASMA
Lactic Acid, Venous: 4.7 mmol/L (ref 0.5–1.9)
Lactic Acid, Venous: 3.7 mmol/L (ref 0.5–1.9)

## 2022-10-13 LAB — BLOOD GAS, VENOUS
Acid-base deficit: 3.5 mmol/L — ABNORMAL HIGH (ref 0.0–2.0)
Bicarbonate: 24.6 mmol/L (ref 20.0–28.0)
O2 Saturation: 22.1 %
Patient temperature: 37
pCO2, Ven: 56 mmHg (ref 44–60)
pH, Ven: 7.25 (ref 7.25–7.43)
pO2, Ven: <31 mmHg — CL (ref 32–45)

## 2022-10-13 LAB — MAGNESIUM: Magnesium: 2.3 mg/dL (ref 1.7–2.4)

## 2022-10-13 LAB — PREPARE RBC (CROSSMATCH)

## 2022-10-13 LAB — BETA-HYDROXYBUTYRIC ACID: Beta-Hydroxybutyric Acid: 0.14 mmol/L (ref 0.05–0.27)

## 2022-10-13 LAB — MRSA NEXT GEN BY PCR, NASAL: MRSA by PCR Next Gen: DETECTED — AB

## 2022-10-13 MED ORDER — SENNOSIDES-DOCUSATE SODIUM 8.6-50 MG PO TABS: 2.0000 | ORAL_TABLET | Freq: Every day | ORAL | Status: DC | PRN

## 2022-10-13 MED ORDER — SODIUM CHLORIDE 0.9% IV SOLUTION: Freq: Once | INTRAVENOUS | Status: AC

## 2022-10-13 MED ORDER — NOREPINEPHRINE 4 MG/250ML-% IV SOLN
INTRAVENOUS | Status: AC
  Filled 2022-10-13: qty 250

## 2022-10-13 MED ORDER — ASPIRIN 81 MG PO CHEW: 81.0000 mg | CHEWABLE_TABLET | Freq: Every day | ORAL | Status: DC

## 2022-10-13 MED ORDER — METOPROLOL SUCCINATE ER 50 MG PO TB24: 100.0000 mg | ORAL_TABLET | Freq: Every day | ORAL | Status: DC

## 2022-10-13 MED ORDER — SODIUM BICARBONATE 8.4 % IV SOLN
50.0000 meq | Freq: Once | INTRAVENOUS | Status: AC
  Administered 2022-10-13: 50 meq via INTRAVENOUS
  Filled 2022-10-13: qty 50

## 2022-10-13 MED ORDER — CHLORHEXIDINE GLUCONATE CLOTH 2 % EX PADS
6.0000 | MEDICATED_PAD | Freq: Every day | CUTANEOUS | Status: DC
  Administered 2022-10-14 – 2022-10-18 (×5): 6 via TOPICAL

## 2022-10-13 MED ORDER — ORAL CARE MOUTH RINSE: 15.0000 mL | OROMUCOSAL | Status: DC | PRN

## 2022-10-13 MED ORDER — DEXTROSE IN LACTATED RINGERS 5 % IV SOLN: INTRAVENOUS | Status: DC

## 2022-10-13 MED ORDER — SODIUM CHLORIDE 0.9 % IV SOLN
2.0000 g | INTRAVENOUS | Status: DC
  Administered 2022-10-13 – 2022-10-15 (×3): 2 g via INTRAVENOUS
  Filled 2022-10-13 (×3): qty 20

## 2022-10-13 MED ORDER — SODIUM ZIRCONIUM CYCLOSILICATE 5 G PO PACK
10.0000 g | PACK | Freq: Three times a day (TID) | ORAL | Status: DC
  Administered 2022-10-13 (×3): 10 g via ORAL
  Filled 2022-10-13 (×2): qty 2

## 2022-10-13 MED ORDER — LACTATED RINGERS IV SOLN: INTRAVENOUS | Status: DC

## 2022-10-13 MED ORDER — LACTATED RINGERS IV BOLUS
250.0000 mL | Freq: Once | INTRAVENOUS | Status: AC
  Administered 2022-10-13: 250 mL via INTRAVENOUS

## 2022-10-13 MED ORDER — ONDANSETRON HCL 4 MG/2ML IJ SOLN: 4.0000 mg | Freq: Three times a day (TID) | INTRAMUSCULAR | Status: DC | PRN

## 2022-10-13 MED ORDER — PREGABALIN 75 MG PO CAPS
150.0000 mg | ORAL_CAPSULE | Freq: Three times a day (TID) | ORAL | Status: DC
  Administered 2022-10-13 – 2022-10-19 (×19): 150 mg via ORAL
  Filled 2022-10-13 (×21): qty 2

## 2022-10-13 MED ORDER — DEXTROSE 50 % IV SOLN: 0.0000 mL | INTRAVENOUS | Status: DC | PRN

## 2022-10-13 MED ORDER — SODIUM CHLORIDE 0.9 % IV SOLN
250.0000 mL | INTRAVENOUS | Status: DC
  Administered 2022-10-14: 250 mL via INTRAVENOUS

## 2022-10-13 MED ORDER — HYDRALAZINE HCL 20 MG/ML IJ SOLN: 5.0000 mg | INTRAMUSCULAR | Status: DC | PRN

## 2022-10-13 MED ORDER — DULOXETINE HCL 30 MG PO CPEP
30.0000 mg | ORAL_CAPSULE | Freq: Three times a day (TID) | ORAL | Status: DC
  Administered 2022-10-13 – 2022-10-19 (×19): 30 mg via ORAL
  Filled 2022-10-13 (×20): qty 1

## 2022-10-13 MED ORDER — LACTATED RINGERS IV BOLUS
500.0000 mL | Freq: Once | INTRAVENOUS | Status: AC
  Administered 2022-10-13: 500 mL via INTRAVENOUS

## 2022-10-13 MED ORDER — PANTOPRAZOLE 80MG IVPB - SIMPLE MED
80.0000 mg | Freq: Once | INTRAVENOUS | Status: AC
  Administered 2022-10-13: 80 mg via INTRAVENOUS
  Filled 2022-10-13: qty 100

## 2022-10-13 MED ORDER — SODIUM CHLORIDE 0.9 % IV BOLUS
250.0000 mL | Freq: Once | INTRAVENOUS | Status: AC
  Administered 2022-10-13: 250 mL via INTRAVENOUS

## 2022-10-13 MED ORDER — OXYCODONE-ACETAMINOPHEN 5-325 MG PO TABS
1.0000 | ORAL_TABLET | ORAL | Status: DC | PRN
  Administered 2022-10-13 – 2022-10-18 (×7): 1 via ORAL
  Filled 2022-10-13 (×7): qty 1

## 2022-10-13 MED ORDER — PANTOPRAZOLE SODIUM 40 MG IV SOLR
40.0000 mg | Freq: Two times a day (BID) | INTRAVENOUS | Status: DC
  Administered 2022-10-13 – 2022-10-15 (×6): 40 mg via INTRAVENOUS
  Filled 2022-10-13 (×6): qty 10

## 2022-10-13 MED ORDER — HYDROMORPHONE HCL 1 MG/ML IJ SOLN
1.0000 mg | INTRAMUSCULAR | Status: DC | PRN
  Administered 2022-10-13 – 2022-10-19 (×23): 1 mg via INTRAVENOUS
  Filled 2022-10-13 (×24): qty 1

## 2022-10-13 MED ORDER — TECHNETIUM TC 99M-LABELED RED BLOOD CELLS IV KIT
20.0000 | PACK | Freq: Once | INTRAVENOUS | Status: AC | PRN
  Administered 2022-10-13: 19.75 via INTRAVENOUS

## 2022-10-13 MED ORDER — CALCIUM GLUCONATE-NACL 1-0.675 GM/50ML-% IV SOLN
1.0000 g | Freq: Once | INTRAVENOUS | Status: AC
  Administered 2022-10-13: 1000 mg via INTRAVENOUS
  Filled 2022-10-13: qty 50

## 2022-10-13 MED ORDER — ATORVASTATIN CALCIUM 20 MG PO TABS
40.0000 mg | ORAL_TABLET | Freq: Every day | ORAL | Status: DC
  Administered 2022-10-13 – 2022-10-19 (×7): 40 mg via ORAL
  Filled 2022-10-13 (×7): qty 2

## 2022-10-13 MED ORDER — DULOXETINE HCL 30 MG PO CPEP
60.0000 mg | ORAL_CAPSULE | Freq: Three times a day (TID) | ORAL | Status: DC
  Filled 2022-10-13: qty 2

## 2022-10-13 MED ORDER — ACETAMINOPHEN 325 MG PO TABS
650.0000 mg | ORAL_TABLET | Freq: Four times a day (QID) | ORAL | Status: DC | PRN
  Administered 2022-10-17 (×2): 650 mg via ORAL
  Filled 2022-10-13 (×2): qty 2

## 2022-10-13 MED ORDER — SODIUM ZIRCONIUM CYCLOSILICATE 5 G PO PACK
10.0000 g | PACK | Freq: Once | ORAL | Status: DC
  Filled 2022-10-13: qty 2

## 2022-10-13 MED ORDER — SODIUM CHLORIDE 0.9 % IV SOLN: INTRAVENOUS | Status: DC

## 2022-10-13 MED ORDER — CILOSTAZOL 100 MG PO TABS
100.0000 mg | ORAL_TABLET | Freq: Two times a day (BID) | ORAL | Status: DC
  Filled 2022-10-13: qty 1

## 2022-10-13 MED ORDER — INSULIN REGULAR(HUMAN) IN NACL 100-0.9 UT/100ML-% IV SOLN
INTRAVENOUS | Status: DC
  Administered 2022-10-13: 18 [IU]/h via INTRAVENOUS
  Administered 2022-10-13: 22 [IU]/h via INTRAVENOUS
  Filled 2022-10-13 (×3): qty 100

## 2022-10-13 MED ORDER — NOREPINEPHRINE 4 MG/250ML-% IV SOLN
2.0000 ug/min | INTRAVENOUS | Status: DC
  Administered 2022-10-13: 3 ug/min via INTRAVENOUS

## 2022-10-13 NOTE — Assessment & Plan Note (Addendum)
EGD on 8/15 showed nonbleeding gastric ulcers and gastritis.  Continue IV Protonix today.  Spoke with gastroenterology for reevaluation.  Switched to clear liquid diet today.  May end up needing a colonoscopy and/or small bowel study.

## 2022-10-13 NOTE — Progress Notes (Signed)
Patient has had no UOP since transfer to the ICU. Notified NP, and orders placed for I and O cath. Patient refuses I and O cath at this time, stating "I am comfortable right now and just want to lay here like this until the blood transfusion is finished." Unable to bladder scan patient in side lying position. Will advise day shift RN.  Carmel Sacramento, RN

## 2022-10-13 NOTE — Assessment & Plan Note (Addendum)
Treated earlier in hospital course

## 2022-10-13 NOTE — Assessment & Plan Note (Addendum)
Creatinine up to 1.6 during hospital course and down to 0.88

## 2022-10-13 NOTE — Consult Note (Signed)
PHARMACY CONSULT NOTE - ELECTROLYTES  Pharmacy Consult for Electrolyte Monitoring and Replacement   Recent Labs: Height: 5\' 11"  (180.3 cm) Weight: (!) 163.5 kg (360 lb 7.2 oz) IBW/kg (Calculated) : 75.3 Estimated Creatinine Clearance: 91.3 mL/min (A) (by C-G formula based on SCr of 1.38 mg/dL (H)). Potassium (mmol/L)  Date Value  10/13/2022 6.1 (H)  06/13/2014 4.0   Magnesium (mg/dL)  Date Value  10/96/0454 2.3  11/08/2011 1.7 (L)   Calcium (mg/dL)  Date Value  09/81/1914 8.0 (L)   Calcium, Total (mg/dL)  Date Value  78/29/5621 8.6 (L)   Albumin (g/dL)  Date Value  30/86/5784 2.3 (L)  06/05/2014 3.5   Phosphorus (mg/dL)  Date Value  69/62/9528 2.7   Sodium (mmol/L)  Date Value  10/13/2022 134 (L)  06/13/2014 136   Corrected Ca: 9.36 mg/dL  Assessment  Russell [name] is a 57 y.o. male presenting with hyperglycemia. PMH significant for hypertension, hyperlipidemia, diabetes mellitus,dCHF, depression with anxiety, A-flutter not on anticoagulants, morbid obesity, right BKA, chronic venous insufficiency in leg . Pharmacy has been consulted to monitor and replace electrolytes while on insulin drip.   Diet: NPO MIVF: D5LR @ 125 mL/hr Pertinent medications: lokelma one time doses ordered intermittently  Goal of Therapy: Electrolytes WNL  Plan:  K 6.1: lokelma 10g TID x 48 hours by CCMD BMP ordered q6h while on insulin gtt Check all electrolytes with AM labs  Thank you for allowing pharmacy to be a part of this patient's care.  Bettey Costa, PharmD Clinical Pharmacist 10/13/2022 3:26 PM

## 2022-10-13 NOTE — Assessment & Plan Note (Addendum)
On IV fluid

## 2022-10-13 NOTE — Progress Notes (Signed)
Responded to consult for IV. On arrival to room at 0445, RN requested to wait on IV due to pt care in progress.

## 2022-10-13 NOTE — Consult Note (Signed)
WOC Nurse Consult Note: Reason for Consult:LLE wounds (2) at pretibial region. Consult performed remotely after review of EMR including photographs taken last evening. Wound type:trauma Pressure Injury POA: N/A Measurement:Per nursing flow sheet Wound bed: red, moist, small amount yellow slough Drainage (amount, consistency, odor) none Periwound:hemosiderin staining Dressing procedure/placement/frequency:I have provided guidance for nursing in the daily care of these lesions using a NS cleanse followed by placement of antimicrobial nonadherent gauze (xeroform) topped with a silicone foam and changed daily. A Prevalon boot is provided for the left heel. A sacral silicone foam is to be placed for PI prevention in this area.  WOC nursing team will not follow, but will remain available to this patient, the nursing and medical teams.  Please re-consult if needed.  Thank you for inviting [place] to participate in this patient's Plan of Care.  Ladona Mow, MSN, RN, CNS, GNP, Leda Min, [employer], [employer] phone:  [phone]

## 2022-10-13 NOTE — H&P (View-Only) (Signed)
[facility] GI Inpatient Consult Note   Jamey Reas, M.D.  Reason for Consult: Melena, drop in Hgb   Attending Requesting Consult: Alford Highland, M.D.  Outpatient Primary Physician: Rolin Barry, M.D.  History of Present Illness: Russell [name] is a 57 y.o. male with hx of CHF presents with c/o abdominal pain for the last 2 to 3 days along with progressive nausea and vomiting.  He has been diagnosed with nonketotic hyperosmolar hyperglycemia related to uncontrolled diabetes.  He has a history of right BKA and chronic venous insufficiency.  He has a documented history of atrial flutter not on any anticoagulation.  He does complain of chronic constipation and reportedly had 1 episode of bright red blood per rectum in the emergency room last evening.  He reportedly had 2 episodes of melena today.  Patient denies syncope or dizziness.  He has a negative family history of colon cancer.  He reports having a remote history of colonoscopy in the local area but is unaware of the results.  Past Medical History:  Past Medical History:  Diagnosis Date   Anxiety    CHF (congestive heart failure) (HCC)    EF 30-35%   Chronic diastolic (congestive) heart failure (HCC)    Depression    Diabetes mellitus without complication (HCC)    Hyperlipemia    Hypertension    Morbid obesity (HCC)    Peripheral edema     Problem List: Patient Active Problem List   Diagnosis Date Noted   Hyperosmolar hyperglycemic state (HHS) (HCC) 10/13/2022   HLD (hyperlipidemia) 10/13/2022   Chronic diastolic CHF (congestive heart failure) (HCC) 10/13/2022   Depression with anxiety 10/13/2022   Hyperkalemia 10/13/2022   Elevated lactic acid level 10/13/2022   Rectal bleeding 10/13/2022   Acute blood loss anemia 10/13/2022   Wound infection_ left lower leg 10/13/2022   Upper GI bleed 10/13/2022   Hypovolemic shock (HCC) 10/13/2022   Epigastric abdominal pain 10/13/2022   Sciatica of right side  10/13/2022   AKI (acute kidney injury) (HCC) 10/13/2022   Hypotension 11/21/2020   Atrial flutter with rapid ventricular response (HCC) 11/21/2020   Chronic kidney disease, stage 4, severely decreased GFR (HCC) 06/14/2018   Chronic venous insufficiency 06/14/2018   Cellulitis of left lower extremity 02/01/2018   Cellulitis 05/18/2017   Hyperlipidemia, mixed 01/10/2017   Cellulitis and abscess of left leg 08/08/2016   Diabetes with ulcer of leg (HCC) 08/08/2016   Hyperglycemia 08/08/2016   Morbid obesity (HCC) 08/08/2016   Cellulitis and abscess of leg 08/08/2016   Adjustment disorder with depressed mood 11/21/2014   Other social stressor 11/21/2014   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   Diabetes (HCC) 11/21/2014    Past Surgical History: Past Surgical History:  Procedure Laterality Date   APPENDECTOMY     BELOW KNEE LEG AMPUTATION     Right leg   TONSILLECTOMY      Allergies: Allergies  Allergen Reactions   Fluoxetine     Other reaction(s): Hallucination   Shellfish Allergy Hives   Sulfa Antibiotics Hives   Vancomycin     Other reaction(s): Red Man Syndrome   Metformin Nausea Only    Home Medications: Medications Prior to Admission  Medication Sig Dispense Refill Last Dose   Aspirin-Caffeine 400-32 MG TABS Take 1 tablet by mouth 2 (two) times daily as needed (pain/headache).   PRN at PRN   atorvastatin (LIPITOR) 40 MG tablet Take 40 mg by mouth daily.    10/12/2022  at AM   cetirizine (ZYRTEC) 10 MG tablet Take 10 mg by mouth daily as needed for allergies.   PRN at PRN   collagenase (SANTYL) ointment Apply topically daily. (Patient taking differently: Apply 1 application  topically daily as needed (ulcers/burns).) 15 g 0 PRN at PRN   DULoxetine HCl 30 MG CSDR Take 60 mg by mouth in the morning, at noon, and at bedtime.  11 10/12/2022 at AM   furosemide (LASIX) 40 MG tablet Take 80 mg by mouth 2 (two) times daily.   10/12/2022 at AM   HUMULIN R 100 UNIT/ML  injection Inject 80 Units into the skin 3 (three) times daily before meals.   10/12/2022 at AM   lisinopril (PRINIVIL,ZESTRIL) 10 MG tablet Take 10 mg by mouth 2 (two) times daily.   10/12/2022 at AM   metoprolol succinate (TOPROL-XL) 100 MG 24 hr tablet Take 100 mg by mouth daily.   10/11/2022   potassium chloride SA (K-DUR,KLOR-CON) 20 MEQ tablet Take 20 mEq by mouth 2 (two) times daily as needed (cramps).  3 PRN at PRN   pregabalin (LYRICA) 150 MG capsule Take 150 mg by mouth 3 (three) times daily.    10/12/2022 at AM   carvedilol (COREG) 6.25 MG tablet Take 6.25 mg by mouth 2 (two) times daily with a meal. (Patient not taking: Reported on 10/12/2022)   Not Taking   clotrimazole (LOTRIMIN) 1 % cream Apply 1 application topically 2 (two) times daily. (Patient not taking: Reported on 11/21/2020) 30 g 0 Not Taking   fluticasone (FLONASE) 50 MCG/ACT nasal spray Place 2 sprays into both nostrils daily as needed for allergies. (Patient not taking: Reported on 10/12/2022)   Not Taking   hydrocortisone (ANUSOL-HC) 2.5 % rectal cream Place 1 application rectally 2 (two) times daily. (Patient not taking: Reported on 11/21/2020) 30 g 0 Not Taking   insulin regular human CONCENTRATED (HUMULIN R U-500 KWIKPEN) 500 UNIT/ML KwikPen Inject 0-130 Units into the skin with breakfast, with lunch, and with evening meal. Per sliding scale (Patient not taking: Reported on 10/12/2022)   Not Taking   lidocaine (XYLOCAINE) 5 % ointment Apply 1 application topically as needed. (Patient not taking: Reported on 11/21/2020) 35.44 g 0 Not Taking   meloxicam (MOBIC) 15 MG tablet Take 15 mg by mouth daily as needed for pain. (Patient not taking: Reported on 10/12/2022)   Not Taking   oxyCODONE (ROXICODONE) 5 MG immediate release tablet Take 1 tablet (5 mg total) by mouth every 8 (eight) hours as needed for up to 12 doses for severe pain or breakthrough pain. (Patient not taking: Reported on 10/12/2022) 12 tablet 0 Not Taking   senna-docusate  (SENOKOT-S) 8.6-50 MG tablet Take 2 tablets by mouth daily as needed for mild constipation.      torsemide (DEMADEX) 20 MG tablet Take 1 tablet (20 mg total) by mouth daily. (Patient not taking: Reported on 11/21/2020)      Home medication reconciliation was completed with the patient.   Scheduled Inpatient Medications:    atorvastatin  40 mg Oral Daily   Chlorhexidine Gluconate Cloth  6 each Topical Daily   DULoxetine  30 mg Oral TID   pantoprazole (PROTONIX) IV  40 mg Intravenous Q12H   pregabalin  150 mg Oral TID   sodium zirconium cyclosilicate  10 g Oral Once   sodium zirconium cyclosilicate  10 g Oral TID    Continuous Inpatient Infusions:    sodium chloride Stopped (10/13/22 0415)   cefTRIAXone (  ROCEPHIN)  IV Stopped (10/13/22 0335)   dextrose 5% lactated ringers 125 mL/hr at 10/13/22 1300   insulin 10.5 Units/hr (10/13/22 1416)   lactated ringers 999 mL/hr at 10/13/22 0538   norepinephrine (LEVOPHED) Adult infusion Stopped (10/13/22 1110)    PRN Inpatient Medications:  acetaminophen, dextrose, HYDROmorphone (DILAUDID) injection, ondansetron (ZOFRAN) IV, mouth rinse, oxyCODONE-acetaminophen, senna-docusate  Family History: family history includes CAD in his father; COPD in his mother; Diabetes in his father; Kidney cancer in his brother.   GI Family History: Negative  Social History:   reports that he has never smoked. He has never used smokeless tobacco. He reports that he does not drink alcohol and does not use drugs. The patient denies ETOH, tobacco, or drug use.    Review of Systems: Review of Systems - General ROS: positive for  - fatigue negative for - fever or weight loss Psychological ROS: positive for - anxiety and depression Ophthalmic ROS: negative ENT ROS: negative Allergy and Immunology ROS: negative Hematological and Lymphatic ROS: negative Endocrine ROS: positive for - unexpected weight changes negative for - temperature intolerance Breast ROS:  negative for breast lumps Respiratory ROS: positive for - shortness of breath negative for - cough or hemoptysis Cardiovascular ROS: negative for - chest pain Genito-Urinary ROS: no dysuria, trouble voiding, or hematuria Musculoskeletal ROS: positive for - joint stiffness and swelling negative for - joint pain or joint stiffness Neurological ROS: no TIA or stroke symptoms Dermatological ROS: positive for dry skin, eczema, and rash negative for acne  Physical Examination: BP 126/65   Pulse 88   Temp 97.7 F (36.5 C) (Oral)   Resp 10   Ht 5\' 11"  (1.803 m)   Wt (!) 163.5 kg   SpO2 97%   BMI 50.27 kg/m  Physical Exam Vitals reviewed.  Constitutional:      General: He is not in acute distress.    Appearance: He is obese. He is ill-appearing. He is not toxic-appearing or diaphoretic.  HENT:     Head: Normocephalic and atraumatic.     Mouth/Throat:     Pharynx: Oropharynx is clear.  Eyes:     Extraocular Movements: Extraocular movements intact.     Pupils: Pupils are equal, round, and reactive to light.  Cardiovascular:     Rate and Rhythm: Normal rate.  Pulmonary:     Effort: Pulmonary effort is normal.     Breath sounds: Normal breath sounds. No stridor. No rhonchi.  Chest:     Chest wall: No tenderness.  Abdominal:     General: There is distension.     Palpations: Abdomen is soft. There is no fluid wave.     Tenderness: There is no abdominal tenderness.     Hernia: No hernia is present.     Comments: Obese  Skin:    General: Skin is dry.     Findings: Erythema present.  Neurological:     General: No focal deficit present.     Mental Status: He is alert and oriented to person, place, and time.  Psychiatric:        Mood and Affect: Mood normal. Mood is not anxious.     Data: Lab Results  Component Value Date   WBC 14.7 (H) 10/13/2022   HGB 9.8 (L) 10/13/2022   HCT 30.4 (L) 10/13/2022   MCV 82.2 10/13/2022   PLT 255 10/13/2022   Recent Labs  Lab  10/13/22 0229 10/13/22 0436 10/13/22 0832  HGB 9.0* 8.3* 9.8*   Lab  Results  Component Value Date   NA 134 (L) 10/13/2022   K 6.1 (H) 10/13/2022   CL 104 10/13/2022   CO2 20 (L) 10/13/2022   BUN 39 (H) 10/13/2022   CREATININE 1.38 (H) 10/13/2022   Lab Results  Component Value Date   ALT 12 10/13/2022   AST 16 10/13/2022   ALKPHOS 70 10/13/2022   BILITOT 0.1 (L) 10/13/2022   Recent Labs  Lab 10/13/22 0229  APTT 28  INR 1.2      Latest Ref Rng & Units 10/13/2022    8:32 AM 10/13/2022    4:36 AM 10/13/2022    2:29 AM  CBC  WBC 4.0 - 10.5 K/uL 14.7  10.7  8.6   Hemoglobin 13.0 - 17.0 g/dL 9.8  8.3  9.0   Hematocrit 39.0 - 52.0 % 30.4  25.9  28.7   Platelets 150 - 400 K/uL 255  236  228     STUDIES: CT ABDOMEN PELVIS W CONTRAST  Addendum Date: 10/13/2022   ADDENDUM REPORT: 10/13/2022 00:58 ADDENDUM: There is an area of focal subcutaneous induration identified in the right anterior abdominal wall best seen on image number 30 of series 2. When compared with multiple previous exams dating back to 2020 this area has been present and likely represents an area of subcutaneous scarring or fat necrosis. Electronically Signed   By: Alcide Clever M.D.   On: 10/13/2022 00:58   Result Date: 10/13/2022 CLINICAL DATA:  Abdominal pain EXAM: CT ABDOMEN AND PELVIS WITH CONTRAST TECHNIQUE: Multidetector CT imaging of the abdomen and pelvis was performed using the standard protocol following bolus administration of intravenous contrast. RADIATION DOSE REDUCTION: This exam was performed according to the departmental dose-optimization program which includes automated exposure control, adjustment of the mA and/or kV according to patient size and/or use of iterative reconstruction technique. CONTRAST:  OMNIPAQUE IOHEXOL 350 MG/ML SOLN COMPARISON:  09/29/2021 FINDINGS: Lower chest: No acute abnormality. Hepatobiliary: No focal liver abnormality is seen. No gallstones, gallbladder wall thickening,  or biliary dilatation. Pancreas: Fatty replacement of the pancreas is noted. Spleen: Normal in size without focal abnormality. Adrenals/Urinary Tract: Adrenal glands are within normal limits. Kidneys demonstrate a normal enhancement pattern bilaterally. No renal calculi or obstructive changes are seen. The bladder is partially distended. Stomach/Bowel: No obstructive or inflammatory changes of colon are noted. The appendix has been surgically removed. Small bowel and stomach are unremarkable. Vascular/Lymphatic: No significant vascular findings are present. No enlarged abdominal or pelvic lymph nodes. Reproductive: Prostate is unremarkable. Other: No ascites is noted. Small fat containing umbilical hernia is noted. Musculoskeletal: Degenerative changes of lumbar spine are noted. IMPRESSION: No acute abnormality noted. Electronically Signed: By: Alcide Clever M.D. On: 10/13/2022 00:32   DG Chest Portable 1 View  Result Date: 10/12/2022 CLINICAL DATA:  Epigastric abdominal pain. Pale with bloody bowel movements. EXAM: PORTABLE CHEST 1 VIEW COMPARISON:  None 05/18/2020 FINDINGS: Cardiac enlargement. No vascular congestion, edema, or consolidation. No pleural effusions. No pneumothorax. Mediastinal contours appear intact. IMPRESSION: Cardiac enlargement.  Lungs are clear. Electronically Signed   By: Burman Nieves M.D.   On: 10/12/2022 22:35   @IMAGES @  Assessment:  Melena and hematochezia - Ddx includes PUD, gastritis, Lower GI source (diverticulosis, colon malignancy, ischemic colitis. Etc.) Morbid obesity. Would infection. Hyperosmolar hyperglycemic state - on IV insulin gtt. Hypovolemic shock - ?Etiology (dehydration vs. GIB). Hyperkalemia secondary to [facility]. Diastolic CHF   Recommendations: Serial H/H. Correct anion gap, hyperglycemia. EGD +/- colonoscopy when clinically  feasible. Will follow patient's progress and provide further recommendations as deemed helpful.  Thank you for the  consult. Please call with questions or concerns.  Rosina Lowenstein, "Mellody Dance MD [facility] [address] [place], [place] [address] [phone]  10/13/2022 3:06 PM

## 2022-10-13 NOTE — Assessment & Plan Note (Addendum)
Transfusing 1 unit of packed red blood cells which is his fourth during the hospital course.

## 2022-10-13 NOTE — Consult Note (Signed)
[facility] GI Inpatient Consult Note   Jamey Reas, M.D.  Reason for Consult: Melena, drop in Hgb   Attending Requesting Consult: Alford Highland, M.D.  Outpatient Primary Physician: Rolin Barry, M.D.  History of Present Illness: Russell [name] is a 57 y.o. male with hx of CHF presents with c/o abdominal pain for the last 2 to 3 days along with progressive nausea and vomiting.  He has been diagnosed with nonketotic hyperosmolar hyperglycemia related to uncontrolled diabetes.  He has a history of right BKA and chronic venous insufficiency.  He has a documented history of atrial flutter not on any anticoagulation.  He does complain of chronic constipation and reportedly had 1 episode of bright red blood per rectum in the emergency room last evening.  He reportedly had 2 episodes of melena today.  Patient denies syncope or dizziness.  He has a negative family history of colon cancer.  He reports having a remote history of colonoscopy in the local area but is unaware of the results.  Past Medical History:  Past Medical History:  Diagnosis Date   Anxiety    CHF (congestive heart failure) (HCC)    EF 30-35%   Chronic diastolic (congestive) heart failure (HCC)    Depression    Diabetes mellitus without complication (HCC)    Hyperlipemia    Hypertension    Morbid obesity (HCC)    Peripheral edema     Problem List: Patient Active Problem List   Diagnosis Date Noted   Hyperosmolar hyperglycemic state (HHS) (HCC) 10/13/2022   HLD (hyperlipidemia) 10/13/2022   Chronic diastolic CHF (congestive heart failure) (HCC) 10/13/2022   Depression with anxiety 10/13/2022   Hyperkalemia 10/13/2022   Elevated lactic acid level 10/13/2022   Rectal bleeding 10/13/2022   Acute blood loss anemia 10/13/2022   Wound infection_ left lower leg 10/13/2022   Upper GI bleed 10/13/2022   Hypovolemic shock (HCC) 10/13/2022   Epigastric abdominal pain 10/13/2022   Sciatica of right side  10/13/2022   AKI (acute kidney injury) (HCC) 10/13/2022   Hypotension 11/21/2020   Atrial flutter with rapid ventricular response (HCC) 11/21/2020   Chronic kidney disease, stage 4, severely decreased GFR (HCC) 06/14/2018   Chronic venous insufficiency 06/14/2018   Cellulitis of left lower extremity 02/01/2018   Cellulitis 05/18/2017   Hyperlipidemia, mixed 01/10/2017   Cellulitis and abscess of left leg 08/08/2016   Diabetes with ulcer of leg (HCC) 08/08/2016   Hyperglycemia 08/08/2016   Morbid obesity (HCC) 08/08/2016   Cellulitis and abscess of leg 08/08/2016   Adjustment disorder with depressed mood 11/21/2014   Other social stressor 11/21/2014   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   Diabetes (HCC) 11/21/2014    Past Surgical History: Past Surgical History:  Procedure Laterality Date   APPENDECTOMY     BELOW KNEE LEG AMPUTATION     Right leg   TONSILLECTOMY      Allergies: Allergies  Allergen Reactions   Fluoxetine     Other reaction(s): Hallucination   Shellfish Allergy Hives   Sulfa Antibiotics Hives   Vancomycin     Other reaction(s): Red Man Syndrome   Metformin Nausea Only    Home Medications: Medications Prior to Admission  Medication Sig Dispense Refill Last Dose   Aspirin-Caffeine 400-32 MG TABS Take 1 tablet by mouth 2 (two) times daily as needed (pain/headache).   PRN at PRN   atorvastatin (LIPITOR) 40 MG tablet Take 40 mg by mouth daily.    10/12/2022  at AM   cetirizine (ZYRTEC) 10 MG tablet Take 10 mg by mouth daily as needed for allergies.   PRN at PRN   collagenase (SANTYL) ointment Apply topically daily. (Patient taking differently: Apply 1 application  topically daily as needed (ulcers/burns).) 15 g 0 PRN at PRN   DULoxetine HCl 30 MG CSDR Take 60 mg by mouth in the morning, at noon, and at bedtime.  11 10/12/2022 at AM   furosemide (LASIX) 40 MG tablet Take 80 mg by mouth 2 (two) times daily.   10/12/2022 at AM   HUMULIN R 100 UNIT/ML  injection Inject 80 Units into the skin 3 (three) times daily before meals.   10/12/2022 at AM   lisinopril (PRINIVIL,ZESTRIL) 10 MG tablet Take 10 mg by mouth 2 (two) times daily.   10/12/2022 at AM   metoprolol succinate (TOPROL-XL) 100 MG 24 hr tablet Take 100 mg by mouth daily.   10/11/2022   potassium chloride SA (K-DUR,KLOR-CON) 20 MEQ tablet Take 20 mEq by mouth 2 (two) times daily as needed (cramps).  3 PRN at PRN   pregabalin (LYRICA) 150 MG capsule Take 150 mg by mouth 3 (three) times daily.    10/12/2022 at AM   carvedilol (COREG) 6.25 MG tablet Take 6.25 mg by mouth 2 (two) times daily with a meal. (Patient not taking: Reported on 10/12/2022)   Not Taking   clotrimazole (LOTRIMIN) 1 % cream Apply 1 application topically 2 (two) times daily. (Patient not taking: Reported on 11/21/2020) 30 g 0 Not Taking   fluticasone (FLONASE) 50 MCG/ACT nasal spray Place 2 sprays into both nostrils daily as needed for allergies. (Patient not taking: Reported on 10/12/2022)   Not Taking   hydrocortisone (ANUSOL-HC) 2.5 % rectal cream Place 1 application rectally 2 (two) times daily. (Patient not taking: Reported on 11/21/2020) 30 g 0 Not Taking   insulin regular human CONCENTRATED (HUMULIN R U-500 KWIKPEN) 500 UNIT/ML KwikPen Inject 0-130 Units into the skin with breakfast, with lunch, and with evening meal. Per sliding scale (Patient not taking: Reported on 10/12/2022)   Not Taking   lidocaine (XYLOCAINE) 5 % ointment Apply 1 application topically as needed. (Patient not taking: Reported on 11/21/2020) 35.44 g 0 Not Taking   meloxicam (MOBIC) 15 MG tablet Take 15 mg by mouth daily as needed for pain. (Patient not taking: Reported on 10/12/2022)   Not Taking   oxyCODONE (ROXICODONE) 5 MG immediate release tablet Take 1 tablet (5 mg total) by mouth every 8 (eight) hours as needed for up to 12 doses for severe pain or breakthrough pain. (Patient not taking: Reported on 10/12/2022) 12 tablet 0 Not Taking   senna-docusate  (SENOKOT-S) 8.6-50 MG tablet Take 2 tablets by mouth daily as needed for mild constipation.      torsemide (DEMADEX) 20 MG tablet Take 1 tablet (20 mg total) by mouth daily. (Patient not taking: Reported on 11/21/2020)      Home medication reconciliation was completed with the patient.   Scheduled Inpatient Medications:    atorvastatin  40 mg Oral Daily   Chlorhexidine Gluconate Cloth  6 each Topical Daily   DULoxetine  30 mg Oral TID   pantoprazole (PROTONIX) IV  40 mg Intravenous Q12H   pregabalin  150 mg Oral TID   sodium zirconium cyclosilicate  10 g Oral Once   sodium zirconium cyclosilicate  10 g Oral TID    Continuous Inpatient Infusions:    sodium chloride Stopped (10/13/22 0415)   cefTRIAXone (  ROCEPHIN)  IV Stopped (10/13/22 0335)   dextrose 5% lactated ringers 125 mL/hr at 10/13/22 1300   insulin 10.5 Units/hr (10/13/22 1416)   lactated ringers 999 mL/hr at 10/13/22 0538   norepinephrine (LEVOPHED) Adult infusion Stopped (10/13/22 1110)    PRN Inpatient Medications:  acetaminophen, dextrose, HYDROmorphone (DILAUDID) injection, ondansetron (ZOFRAN) IV, mouth rinse, oxyCODONE-acetaminophen, senna-docusate  Family History: family history includes CAD in his father; COPD in his mother; Diabetes in his father; Kidney cancer in his brother.   GI Family History: Negative  Social History:   reports that he has never smoked. He has never used smokeless tobacco. He reports that he does not drink alcohol and does not use drugs. The patient denies ETOH, tobacco, or drug use.    Review of Systems: Review of Systems - General ROS: positive for  - fatigue negative for - fever or weight loss Psychological ROS: positive for - anxiety and depression Ophthalmic ROS: negative ENT ROS: negative Allergy and Immunology ROS: negative Hematological and Lymphatic ROS: negative Endocrine ROS: positive for - unexpected weight changes negative for - temperature intolerance Breast ROS:  negative for breast lumps Respiratory ROS: positive for - shortness of breath negative for - cough or hemoptysis Cardiovascular ROS: negative for - chest pain Genito-Urinary ROS: no dysuria, trouble voiding, or hematuria Musculoskeletal ROS: positive for - joint stiffness and swelling negative for - joint pain or joint stiffness Neurological ROS: no TIA or stroke symptoms Dermatological ROS: positive for dry skin, eczema, and rash negative for acne  Physical Examination: BP 126/65   Pulse 88   Temp 97.7 F (36.5 C) (Oral)   Resp 10   Ht 5\' 11"  (1.803 m)   Wt (!) 163.5 kg   SpO2 97%   BMI 50.27 kg/m  Physical Exam Vitals reviewed.  Constitutional:      General: He is not in acute distress.    Appearance: He is obese. He is ill-appearing. He is not toxic-appearing or diaphoretic.  HENT:     Head: Normocephalic and atraumatic.     Mouth/Throat:     Pharynx: Oropharynx is clear.  Eyes:     Extraocular Movements: Extraocular movements intact.     Pupils: Pupils are equal, round, and reactive to light.  Cardiovascular:     Rate and Rhythm: Normal rate.  Pulmonary:     Effort: Pulmonary effort is normal.     Breath sounds: Normal breath sounds. No stridor. No rhonchi.  Chest:     Chest wall: No tenderness.  Abdominal:     General: There is distension.     Palpations: Abdomen is soft. There is no fluid wave.     Tenderness: There is no abdominal tenderness.     Hernia: No hernia is present.     Comments: Obese  Skin:    General: Skin is dry.     Findings: Erythema present.  Neurological:     General: No focal deficit present.     Mental Status: He is alert and oriented to person, place, and time.  Psychiatric:        Mood and Affect: Mood normal. Mood is not anxious.     Data: Lab Results  Component Value Date   WBC 14.7 (H) 10/13/2022   HGB 9.8 (L) 10/13/2022   HCT 30.4 (L) 10/13/2022   MCV 82.2 10/13/2022   PLT 255 10/13/2022   Recent Labs  Lab  10/13/22 0229 10/13/22 0436 10/13/22 0832  HGB 9.0* 8.3* 9.8*   Lab  Results  Component Value Date   NA 134 (L) 10/13/2022   K 6.1 (H) 10/13/2022   CL 104 10/13/2022   CO2 20 (L) 10/13/2022   BUN 39 (H) 10/13/2022   CREATININE 1.38 (H) 10/13/2022   Lab Results  Component Value Date   ALT 12 10/13/2022   AST 16 10/13/2022   ALKPHOS 70 10/13/2022   BILITOT 0.1 (L) 10/13/2022   Recent Labs  Lab 10/13/22 0229  APTT 28  INR 1.2      Latest Ref Rng & Units 10/13/2022    8:32 AM 10/13/2022    4:36 AM 10/13/2022    2:29 AM  CBC  WBC 4.0 - 10.5 K/uL 14.7  10.7  8.6   Hemoglobin 13.0 - 17.0 g/dL 9.8  8.3  9.0   Hematocrit 39.0 - 52.0 % 30.4  25.9  28.7   Platelets 150 - 400 K/uL 255  236  228     STUDIES: CT ABDOMEN PELVIS W CONTRAST  Addendum Date: 10/13/2022   ADDENDUM REPORT: 10/13/2022 00:58 ADDENDUM: There is an area of focal subcutaneous induration identified in the right anterior abdominal wall best seen on image number 30 of series 2. When compared with multiple previous exams dating back to 2020 this area has been present and likely represents an area of subcutaneous scarring or fat necrosis. Electronically Signed   By: Alcide Clever M.D.   On: 10/13/2022 00:58   Result Date: 10/13/2022 CLINICAL DATA:  Abdominal pain EXAM: CT ABDOMEN AND PELVIS WITH CONTRAST TECHNIQUE: Multidetector CT imaging of the abdomen and pelvis was performed using the standard protocol following bolus administration of intravenous contrast. RADIATION DOSE REDUCTION: This exam was performed according to the departmental dose-optimization program which includes automated exposure control, adjustment of the mA and/or kV according to patient size and/or use of iterative reconstruction technique. CONTRAST:  OMNIPAQUE IOHEXOL 350 MG/ML SOLN COMPARISON:  09/29/2021 FINDINGS: Lower chest: No acute abnormality. Hepatobiliary: No focal liver abnormality is seen. No gallstones, gallbladder wall thickening,  or biliary dilatation. Pancreas: Fatty replacement of the pancreas is noted. Spleen: Normal in size without focal abnormality. Adrenals/Urinary Tract: Adrenal glands are within normal limits. Kidneys demonstrate a normal enhancement pattern bilaterally. No renal calculi or obstructive changes are seen. The bladder is partially distended. Stomach/Bowel: No obstructive or inflammatory changes of colon are noted. The appendix has been surgically removed. Small bowel and stomach are unremarkable. Vascular/Lymphatic: No significant vascular findings are present. No enlarged abdominal or pelvic lymph nodes. Reproductive: Prostate is unremarkable. Other: No ascites is noted. Small fat containing umbilical hernia is noted. Musculoskeletal: Degenerative changes of lumbar spine are noted. IMPRESSION: No acute abnormality noted. Electronically Signed: By: Alcide Clever M.D. On: 10/13/2022 00:32   DG Chest Portable 1 View  Result Date: 10/12/2022 CLINICAL DATA:  Epigastric abdominal pain. Pale with bloody bowel movements. EXAM: PORTABLE CHEST 1 VIEW COMPARISON:  None 05/18/2020 FINDINGS: Cardiac enlargement. No vascular congestion, edema, or consolidation. No pleural effusions. No pneumothorax. Mediastinal contours appear intact. IMPRESSION: Cardiac enlargement.  Lungs are clear. Electronically Signed   By: Burman Nieves M.D.   On: 10/12/2022 22:35   @IMAGES @  Assessment:  Melena and hematochezia - Ddx includes PUD, gastritis, Lower GI source (diverticulosis, colon malignancy, ischemic colitis. Etc.) Morbid obesity. Would infection. Hyperosmolar hyperglycemic state - on IV insulin gtt. Hypovolemic shock - ?Etiology (dehydration vs. GIB). Hyperkalemia secondary to [facility]. Diastolic CHF   Recommendations: Serial H/H. Correct anion gap, hyperglycemia. EGD +/- colonoscopy when clinically  feasible. Will follow patient's progress and provide further recommendations as deemed helpful.  Thank you for the  consult. Please call with questions or concerns.  Rosina Lowenstein, "Mellody Dance MD [facility] [address] [place], [place] [address] [phone]  10/13/2022 3:06 PM

## 2022-10-13 NOTE — Assessment & Plan Note (Addendum)
Resolved.  Patient required Levophed briefly during the hospital course.  Patient required 4 units of packed red blood cells during hospital course.

## 2022-10-13 NOTE — ED Provider Notes (Signed)
Patient received in signout from Dr. Scotty Court pending remainder of serum workup and CT abdomen/pelvis.  Patient presents with GI symptoms and an episode of hematochezia.  1 point hemoglobin drop from remote comparison.  Metabolic panel with hyperkalemia, hyperglycemia without anion gap.  CT returns without acute features.  Chronic subcutaneous fat stranding.  We will initiate IV treatment for hyperkalemia  Clinical Course as of 10/13/22 0102  Tue Oct 12, 2022  2345 Patient refusing CT scan due to chronic sciatica on the right side.  Blood work returning now with signs of fairly severe hyperkalemia.  I go to the bedside and inform him that he may need a CT scan urgently and he is agreeable, pending IV analgesia which we will provide to facilitate appropriate workup in the setting of possible critical illness. [DS]    Clinical Course User Index [DS] Delton Prairie, MD   .Critical Care  Performed by: Delton Prairie, MD Authorized by: Delton Prairie, MD   Critical care provider statement:    Critical care time (minutes):  30   Critical care time was exclusive of:  Separately billable procedures and treating other patients   Critical care was necessary to treat or prevent imminent or life-threatening deterioration of the following conditions:  Metabolic crisis   Critical care was time spent personally by me on the following activities:  Development of treatment plan with patient or surrogate, discussions with consultants, evaluation of patient's response to treatment, examination of patient, ordering and review of laboratory studies, ordering and review of radiographic studies, ordering and performing treatments and interventions, pulse oximetry, re-evaluation of patient's condition and review of old charts     Delton Prairie, MD 10/13/22 0104

## 2022-10-13 NOTE — Progress Notes (Addendum)
Critical care note  Date of note: 10/13/2022  Subjective: The patient's blood pressure dropped to 95/47 with a MAP of 60.  He was given a bolus of 250 mL IV normal saline and was still hypotensive with a BP of 72/48 with a MAP of 56.  It was difficult to palpate of a pulse in his left foot.  He was mildly lethargic and kept requesting to drink water and ginger ale and at some point was going to leave AMA but was convinced to stay given the gravity of his condition.  Objective: Physical examination: Generally: Acutely ill morbidly obese Caucasian male, restless and lethargic in no acute respiratory distress Vital signs: BP 81/41 with a MAP of 53, heart rate 84, respiratory rate 14, pulse ox entry at 91% on room air and most recent temperature 97.8. Head - atraumatic, normocephalic.  Pupils - equal, round and reactive to light and accommodation. Extraocular movements are intact.  Positive pallor.  No scleral icterus.  Oropharynx - moist mucous membranes and tongue. No pharyngeal erythema or exudate.  Neck - supple. No JVD. Carotid pulses 2+ bilaterally. No carotid bruits. No palpable thyromegaly or lymphadenopathy. Cardiovascular - regular rate and rhythm. Normal S1 and S2. No murmurs, gallops or rubs.  Lungs - clear to auscultation bilaterally.  Abdomen - soft and nontender. Positive bowel sounds. No palpable organomegaly or masses.  Extremities - no pitting edema, clubbing or cyanosis.  Neuro - grossly non-focal. Skin - no rashes. GU and rectal exam - deferred.  Labs and notes were reviewed.  Assessment/plan: 1.  Persistent hypotension and hypovolemic shock possibly secondary to GI bleeding in the setting of nonketotic hyperosmolar hyperglycemia. - The patient continued to be hypotensive despite a total of 2 L of IV fluids.  He was ordered IV Levophed with improvement of his blood pressure. - I discussed the benefits and risks of PRBCs transfusion to the patient and he accepted to get 2  units of PRBCs. - Will type and screen and transfuse 2 units of PRBCs. - Will follow posttransfusion H&H. - He will be started on IV PPI therapy with 80 mg now then 40 mg IV every 12 hours. - We will continue IV insulin drip.  He was allowed to drink some water at this time. - ICU consult to be obtained. - We will continue to closely monitor him.   Authorized and performed by: Valente David, MD Total critical care time:  35      minutes. Due to a high probability of clinically significant, life-threatening deterioration, the patient required my highest level of preparedness to intervene emergently and I personally spent this critical care time directly and personally managing the patient.  This critical care time included obtaining a history, examining the patient, pulse oximetry, ordering and review of studies, arranging urgent treatment with development of management plan, evaluation of patient's response to treatment, frequent reassessment, and discussions with other providers. This critical care time was performed to assess and manage the high probability of imminent, life-threatening deterioration that could result in multiorgan failure.  It was exclusive of separately billable procedures and treating other patients and teaching time.

## 2022-10-13 NOTE — Assessment & Plan Note (Addendum)
With patient being put on oxygen I did give a dose of Lasix this morning likely fluid overload from blood and fluids.  Will give a another dose of Lasix after transfusion today.

## 2022-10-13 NOTE — Inpatient Diabetes Management (Addendum)
Inpatient Diabetes Program Recommendations  AACE/ADA: New Consensus Statement on Inpatient Glycemic Control (2015)  Target Ranges:  Prepandial:   less than 140 mg/dL      Peak postprandial:   less than 180 mg/dL (1-2 hours)      Critically ill patients:  140 - 180 mg/dL   Lab Results  Component Value Date   GLUCAP 200 (H) 10/13/2022   HGBA1C 11.8 (H) 11/21/2020    Review of Glycemic Control  Latest Reference Range & Units 10/13/22 05:05 10/13/22 06:13 10/13/22 07:18 10/13/22 08:03 10/13/22 08:45  Glucose-Capillary 70 - 99 mg/dL 962 (H) 952 (H) 841 (H) 240 (H) 200 (H)   Diabetes history: DM 2 Outpatient Diabetes medications:  Regular insulin 80 units tid with meals (if blood sugars>180 mg/dL)  Current orders for Inpatient glycemic control:  IV insulin- Goal 140-180  Inpatient Diabetes Program Recommendations:    Agree with IV insulin until blood sugars within goal (140-180).  May be beneficial to use insulin drip rate to determine insulin needs.  Doubt that A1C will be accurate due to anemia.  Will follow.   Addendum 1345- Note patient continues on IV insulin and is NPO.  Last insulin drip was 9 units/hr indicating that patient is requiring high doses of insulin (Approx. 216 units/24 hours).  Recommend continuing IV insulin for now.  If transition off IV insulin is indicated, consider Levemir 45 units q 4 hours and Novolog resistant q 4 hours.  Unsure why patient only takes Regular insulin at home- likely needs basal insulin added for improved glycemic control.  Will follow-up on 10/14/22.   Thanks,  Beryl Meager, RN, BC-ADM Inpatient Diabetes Coordinator Pager [phone]  (8a-5p)

## 2022-10-13 NOTE — Assessment & Plan Note (Addendum)
Patient converted off insulin drip 8/15.  Increase Levemir insulin 45 units twice days plus sliding scale.  Soft diet.

## 2022-10-13 NOTE — Assessment & Plan Note (Addendum)
On Lyrica and pain medication.  Refer to pain management as outpatient.  A few days of pain pills prescribed.  Prednisone did not help his pain.

## 2022-10-13 NOTE — Hospital Course (Addendum)
57 y.o. male with medical history significant of hypertension, hyperlipidemia, diabetes mellitus,dCHF, depression with anxiety, A-flutter not on anticoagulants, morbid obesity, right BKA, chronic venous insufficiency in leg, who presents with abdominal pain, rectal bleeding, left lower leg wounds,   Patient states he has abdominal pain for more than 2 days, which is located in the upper abdomen, constant, aching, 7 out of 10 in severity, nonradiating.  It is not aggravated or alleviated by any known factors.  Associated with nausea, no vomiting.  8/14.  Patient received a unit of blood in the early morning.  Had a black stool when I saw him.  Patient had epigastric pain coming in.  Notified gastroenterology team.  Patient on low-dose Levophed this morning. 8/15.  EGD showing gastritis and nonbleeding gastric ulcers.  Patient converted off insulin drip this morning.  Afternoon hemoglobin down to 7.5 and given a unit of blood. 8/16.  Notified that patient had a maroon-colored stool.  A.m. hemoglobin 7.6.  Will give another unit of blood.  Lasix before and after blood transfusion. 8/17.  Hemoglobin 7.9.  Colonoscopy did not show any active blood in the colon. 8/18.  Hemoglobin 8.3.  Patient seen sitting up.  Complains of a lot of pain and interested in seeing pain management as outpatient.  Patient told nursing staff that he does not want to go home with the Foley catheter.  He is not interested in doing an overnight oximetry to see if he qualifies for nocturnal oxygen. 8/19.  Patient does not want to go home with the Foley catheter.  Will take out Foley catheter at 12 midnight tonight and see if he is able to urinate by tomorrow.  I explained that if he does not urinate at the time I see him he will likely have to need a catheter to go back in and we will have to go home with catheter.  Continue Flomax. 8/20.  Foley catheter removed at 12 midnight and patient stated he urinated at 6 AM.  Will discharge home  today.

## 2022-10-13 NOTE — Assessment & Plan Note (Addendum)
On Keflex

## 2022-10-13 NOTE — H&P (Addendum)
History and Physical    Russell [name] MWU:[id number] DOB: Mar 15, 1965 DOA: 10/12/2022  Referring MD/NP/PA:   PCP: [name], MD   Patient coming from:  The patient is coming from home.     Chief Complaint: Abdominal pain, rectal bleeding, left lower leg wounds  HPI: Russell [name] is a 58 y.o. male with medical history significant of hypertension, hyperlipidemia, diabetes mellitus,dCHF, depression with anxiety, A-flutter not on anticoagulants, morbid obesity, right BKA, chronic venous insufficiency in leg, who presents with abdominal pain, rectal bleeding, left lower leg wounds,  Patient states he has abdominal pain for more than 2 days, which is located in the upper abdomen, constant, aching, 7 out of 10 in severity, nonradiating.  It is not aggravated or alleviated by any known factors.  Associated with nausea, no vomiting.  Patient states that he has constipation.  He took milk magnesium, and had 1 episode of loose stool bowel movement at home. Patient did not have rectal bleeding at home, but had 1 episode of rectal bleeding in ED. Per ED physician, patient had bright red blood in stool, but patient states that it is dark. Patient denies chest pain, cough, shortness of breath.  No fever or chills.  No symptoms of UTI.  Patient has multiple chronic wounds in left lower leg, which is mildly painful.  Data reviewed independently and ED Course: pt was found to have BNP 47, hemoglobin 11.2 (12.0 on 09/29/2021), blood sugar 600, bicarbonate 25, anion gap 9, pseudohyponatremia, potassium 7.2, GFR> 60, negative COVID PCR, troponin level 12, WBC 10.7, lactic acid 2.6, procalcitonin<0.10, blood pressure 124/76, heart rate 77, RR 24, oxygen saturation 98% on room air.  Chest x-ray showed cardiomegaly without infiltration.  CT abdomen/pelvis is negative for acute intra-abdominal issues.  Patient is placed on stepdown bed for observation.   EKG: I have personally reviewed.  Sinus rhythm, QTc  452, LAE, right axis deviation, low voltage, early R wave progression.   Review of Systems:   General: no fevers, chills, no body weight gain, has fatigue HEENT: no blurry vision, hearing changes or sore throat Respiratory: no dyspnea, coughing, wheezing CV: no chest pain, no palpitations GI: has nausea, abdominal pain, rectal bleeding, no constipation, vomiting GU: no dysuria, burning on urination, increased urinary frequency, hematuria  Ext: has leg edema.  Neuro: no unilateral weakness, numbness, or tingling, no vision change or hearing loss Skin: no rash, no skin tear. MSK: No muscle spasm, no deformity, no limitation of range of movement in spin Heme: No easy bruising.  Travel history: No recent long distant travel.   Allergy:  Allergies  Allergen Reactions   Fluoxetine     Other reaction(s): Hallucination   Shellfish Allergy Hives   Sulfa Antibiotics Hives   Vancomycin     Other reaction(s): Red Man Syndrome   Metformin Nausea Only    Past Medical History:  Diagnosis Date   Anxiety    CHF (congestive heart failure) (HCC)    EF 30-35%   Chronic diastolic (congestive) heart failure (HCC)    Depression    Diabetes mellitus without complication (HCC)    Hyperlipemia    Hypertension    Morbid obesity (HCC)    Peripheral edema     Past Surgical History:  Procedure Laterality Date   APPENDECTOMY     BELOW KNEE LEG AMPUTATION     Right leg   TONSILLECTOMY      Social History:  reports that he has never smoked. He has  never used smokeless tobacco. He reports that he does not drink alcohol and does not use drugs.  Family History:  Family History  Problem Relation Age of Onset   COPD Mother    Diabetes Father    CAD Father    Kidney cancer Brother      Prior to Admission medications   Medication Sig Start Date End Date Taking? Authorizing Provider  Aspirin-Caffeine 845-65 MG PACK Take 1 packet by mouth 2 (two) times daily as needed (pain/headache).   Yes  [provider]  atorvastatin (LIPITOR) 40 MG tablet Take 40 mg by mouth daily.    Yes [provider]  cetirizine (ZYRTEC) 10 MG tablet Take 10 mg by mouth daily as needed for allergies. 06/23/20  Yes [provider]  collagenase (SANTYL) ointment Apply topically daily. Patient taking differently: Apply 1 application  topically daily as needed (ulcers/burns). 02/02/18  Yes Mody, Patricia Pesa, MD  DULoxetine HCl 30 MG CSDR Take 60 mg by mouth in the morning, at noon, and at bedtime. 11/08/17  Yes [provider]  furosemide (LASIX) 40 MG tablet Take 80 mg by mouth 2 (two) times daily.   Yes [provider]  HUMULIN R 100 UNIT/ML injection Inject 80 Units into the skin 3 (three) times daily before meals. 10/11/22  Yes [provider]  lisinopril (PRINIVIL,ZESTRIL) 10 MG tablet Take 10 mg by mouth 2 (two) times daily.   Yes [provider]  metoprolol succinate (TOPROL-XL) 100 MG 24 hr tablet Take 100 mg by mouth daily.   Yes [provider]  potassium chloride SA (K-DUR,KLOR-CON) 20 MEQ tablet Take 20 mEq by mouth 2 (two) times daily as needed (cramps). 11/08/17  Yes [provider]  pregabalin (LYRICA) 150 MG capsule Take 150 mg by mouth 3 (three) times daily.    Yes [provider]  carvedilol (COREG) 6.25 MG tablet Take 6.25 mg by mouth 2 (two) times daily with a meal. Patient not taking: Reported on 10/12/2022    [provider]  clotrimazole (LOTRIMIN) 1 % cream Apply 1 application topically 2 (two) times daily. Patient not taking: Reported on 11/21/2020 06/03/18   Houston Siren, MD  fluticasone [facility]) 50 MCG/ACT nasal spray Place 2 sprays into both nostrils daily as needed for allergies. Patient not taking: Reported on 10/12/2022 11/14/20   [provider]  hydrocortisone (ANUSOL-HC) 2.5 % rectal cream Place 1 application rectally 2 (two) times daily. Patient not taking: Reported on 11/21/2020  07/04/19   Henrene Dodge, MD  insulin regular human CONCENTRATED (HUMULIN R U-500 KWIKPEN) 500 UNIT/ML KwikPen Inject 0-130 Units into the skin with breakfast, with lunch, and with evening meal. Per sliding scale Patient not taking: Reported on 10/12/2022    [provider]  lidocaine (XYLOCAINE) 5 % ointment Apply 1 application topically as needed. Patient not taking: Reported on 11/21/2020 07/04/19   Henrene Dodge, MD  meloxicam (MOBIC) 15 MG tablet Take 15 mg by mouth daily as needed for pain. Patient not taking: Reported on 10/12/2022    [provider]  oxyCODONE (ROXICODONE) 5 MG immediate release tablet Take 1 tablet (5 mg total) by mouth every 8 (eight) hours as needed for up to 12 doses for severe pain or breakthrough pain. Patient not taking: Reported on 10/12/2022 10/06/22   Pilar Jarvis, MD  senna-docusate (SENOKOT-S) 8.6-50 MG tablet Take 2 tablets by mouth daily as needed for mild constipation.    [provider]  torsemide (DEMADEX) 20 MG  tablet Take 1 tablet (20 mg total) by mouth daily. Patient not taking: Reported on 11/21/2020 06/03/18   Houston Siren, MD    Physical Exam: Vitals:   10/13/22 0030 10/13/22 0100 10/13/22 0121 10/13/22 0135  BP: (!) 111/46 (!) 136/110  (!) 90/48  Pulse: 89   86  Resp: 20   18  Temp:   98 F (36.7 C)   TempSrc:   Axillary   SpO2: 95%   98%  Weight:      Height:       General: Not in acute distress HEENT:       Eyes: PERRL, EOMI, no jaundice       ENT: No discharge from the ears and nose, no pharynx injection, no tonsillar enlargement.        Neck: Difficult to assess JVD due to morbid obesity, no bruit, no mass felt. Heme: No neck lymph node enlargement. Cardiac: S1/S2, RRR, No murmurs, No gallops or rubs. Respiratory: No rales, wheezing, rhonchi or rubs. GI: Soft, nondistended, has epigastric tenderness, no rebound pain, no organomegaly, BS present. GU: No hematuria Ext: has 1+ pitting leg leg edema. S/p of right  BKA. Musculoskeletal: No joint deformities, No joint redness or warmth, no limitation of ROM in spin. Skin: Has multiple wounds in left lower leg, with pus on the surface.   Neuro: Alert, oriented X3, cranial nerves II-XII grossly intact, moves all extremities normally. Psych: Patient is not psychotic, no suicidal or hemocidal ideation.  Labs on Admission: I have personally reviewed following labs and imaging studies  CBC: Recent Labs  Lab 10/12/22 2216  WBC 10.7*  NEUTROABS 8.6*  HGB 11.2*  HCT 35.7*  MCV 82.1  PLT 244   Basic Metabolic Panel: Recent Labs  Lab 10/12/22 2216  NA 129*  K 7.2*  CL 95*  CO2 25  GLUCOSE 600*  BUN 27*  CREATININE 1.14  CALCIUM 8.3*   GFR: Estimated Creatinine Clearance: 113.3 mL/min (by C-G formula based on SCr of 1.14 mg/dL). Liver Function Tests: Recent Labs  Lab 10/12/22 2216  AST 18  ALT 16  ALKPHOS 97  BILITOT 0.7  PROT 6.3*  ALBUMIN 2.8*   Recent Labs  Lab 10/12/22 2216  LIPASE 24   No results for input(s): "AMMONIA" in the last 168 hours. Coagulation Profile: No results for input(s): "INR", "PROTIME" in the last 168 hours. Cardiac Enzymes: No results for input(s): "CKTOTAL", "CKMB", "CKMBINDEX", "TROPONINI" in the last 168 hours. BNP (last 3 results) No results for input(s): "PROBNP" in the last 8760 hours. HbA1C: No results for input(s): "HGBA1C" in the last 72 hours. CBG: Recent Labs  Lab 10/13/22 0036 10/13/22 0135  GLUCAP 495* 264*   Lipid Profile: No results for input(s): "CHOL", "HDL", "LDLCALC", "TRIG", "CHOLHDL", "LDLDIRECT" in the last 72 hours. Thyroid Function Tests: No results for input(s): "TSH", "T4TOTAL", "FREET4", "T3FREE", "THYROIDAB" in the last 72 hours. Anemia Panel: No results for input(s): "VITAMINB12", "FOLATE", "FERRITIN", "TIBC", "IRON", "RETICCTPCT" in the last 72 hours. Urine analysis:    Component Value Date/Time   COLORURINE YELLOW (A) 11/20/2016 1307   APPEARANCEUR CLEAR (A)  11/20/2016 1307   APPEARANCEUR Clear 08/13/2013 1624   LABSPEC 1.022 11/20/2016 1307   LABSPEC 1.012 08/13/2013 1624   PHURINE 6.0 11/20/2016 1307   GLUCOSEU >=500 (A) 11/20/2016 1307   GLUCOSEU 150 mg/dL 13/24/4010 2725   HGBUR SMALL (A) 11/20/2016 1307   BILIRUBINUR NEGATIVE 11/20/2016 1307   BILIRUBINUR Negative 08/13/2013 1624   KETONESUR 5 (  A) 11/20/2016 1307   PROTEINUR NEGATIVE 11/20/2016 1307   NITRITE NEGATIVE 11/20/2016 1307   LEUKOCYTESUR NEGATIVE 11/20/2016 1307   LEUKOCYTESUR Negative 08/13/2013 1624   Sepsis Labs: @LABRCNTIP (procalcitonin:4,lacticidven:4) ) Recent Results (from the past 240 hour(s))  SARS Coronavirus 2 by RT PCR (hospital order, performed in [facility] hospital lab) *cepheid single result test* Anterior Nasal Swab     Status: None   Collection Time: 10/12/22 11:13 PM   Specimen: Anterior Nasal Swab  Result Value Ref Range Status   SARS Coronavirus 2 by RT PCR NEGATIVE NEGATIVE Final    Comment: (NOTE) SARS-CoV-2 target nucleic acids are NOT DETECTED.  The SARS-CoV-2 RNA is generally detectable in upper and lower respiratory specimens during the acute phase of infection. The lowest concentration of SARS-CoV-2 viral copies this assay can detect is 250 copies / mL. A negative result does not preclude SARS-CoV-2 infection and should not be used as the sole basis for treatment or other patient management decisions.  A negative result may occur with improper specimen collection / handling, submission of specimen other than nasopharyngeal swab, presence of viral mutation(s) within the areas targeted by this assay, and inadequate number of viral copies (<250 copies / mL). A negative result must be combined with clinical observations, patient history, and epidemiological information.  Fact Sheet for Patients:   RoadLapTop.co.za  Fact Sheet for Healthcare Providers: http://kim-miller.com/  This test is not  yet approved or  cleared by the [place] FDA and has been authorized for detection and/or diagnosis of SARS-CoV-2 by FDA under an Emergency Use Authorization (EUA).  This EUA will remain in effect (meaning this test can be used) for the duration of the COVID-19 declaration under Section 564(b)(1) of the Act, 21 U.S.C. section 360bbb-3(b)(1), unless the authorization is terminated or revoked sooner.  Performed at [facility], [address] [place], [place] [address]      Radiological Exams on Admission: CT ABDOMEN PELVIS W CONTRAST  Addendum Date: 10/13/2022   ADDENDUM REPORT: 10/13/2022 00:58 ADDENDUM: There is an area of focal subcutaneous induration identified in the right anterior abdominal wall best seen on image number 30 of series 2. When compared with multiple previous exams dating back to 2020 this area has been present and likely represents an area of subcutaneous scarring or fat necrosis. Electronically Signed   By: Alcide Clever M.D.   On: 10/13/2022 00:58   Result Date: 10/13/2022 CLINICAL DATA:  Abdominal pain EXAM: CT ABDOMEN AND PELVIS WITH CONTRAST TECHNIQUE: Multidetector CT imaging of the abdomen and pelvis was performed using the standard protocol following bolus administration of intravenous contrast. RADIATION DOSE REDUCTION: This exam was performed according to the departmental dose-optimization program which includes automated exposure control, adjustment of the mA and/or kV according to patient size and/or use of iterative reconstruction technique. CONTRAST:  OMNIPAQUE IOHEXOL 350 MG/ML SOLN COMPARISON:  09/29/2021 FINDINGS: Lower chest: No acute abnormality. Hepatobiliary: No focal liver abnormality is seen. No gallstones, gallbladder wall thickening, or biliary dilatation. Pancreas: Fatty replacement of the pancreas is noted. Spleen: Normal in size without focal abnormality. Adrenals/Urinary Tract: Adrenal glands are within normal limits. Kidneys  demonstrate a normal enhancement pattern bilaterally. No renal calculi or obstructive changes are seen. The bladder is partially distended. Stomach/Bowel: No obstructive or inflammatory changes of colon are noted. The appendix has been surgically removed. Small bowel and stomach are unremarkable. Vascular/Lymphatic: No significant vascular findings are present. No enlarged abdominal or pelvic lymph nodes. Reproductive: Prostate is unremarkable.  Other: No ascites is noted. Small fat containing umbilical hernia is noted. Musculoskeletal: Degenerative changes of lumbar spine are noted. IMPRESSION: No acute abnormality noted. Electronically Signed: By: Alcide Clever M.D. On: 10/13/2022 00:32   DG Chest Portable 1 View  Result Date: 10/12/2022 CLINICAL DATA:  Epigastric abdominal pain. Pale with bloody bowel movements. EXAM: PORTABLE CHEST 1 VIEW COMPARISON:  None 05/18/2020 FINDINGS: Cardiac enlargement. No vascular congestion, edema, or consolidation. No pleural effusions. No pneumothorax. Mediastinal contours appear intact. IMPRESSION: Cardiac enlargement.  Lungs are clear. Electronically Signed   By: Burman Nieves M.D.   On: 10/12/2022 22:35      Assessment/Plan Principal Problem:   Hyperosmolar hyperglycemic state (HHS) (HCC) Active Problems:   Poorly controlled diabetes mellitus (HCC)   Rectal bleeding   Acute blood loss anemia   Abdominal pain   Hyperkalemia   Chronic diastolic CHF (congestive heart failure) (HCC)   Elevated lactic acid level   HTN (hypertension)   HLD (hyperlipidemia)   Wound infection_ left lower leg   Depression with anxiety   Morbid obesity (HCC)   Assessment and Plan:   Hyperosmolar hyperglycemic state (HHS) (HCC): Blood sugar 600.  Mental status normal.  Bicarbonate of 25, anion gap 9.  No DKA.  - Place in SDU for obs - IVF:  1.5 L of NS bolus - start insulin drip with BMP q4h - IVF: LR at 75 cc/h, will switch to D5-LR at 75 cc/h when CBG<250 - Zofran prn  nausea  - NPO  - consult to diabetic educator  Poorly controlled diabetes mellitus: Recent A1c 11.8, patient is taking Humulin 80 units 3 times daily.  Now has HSS -On insulin drip as above  Rectal bleeding and acute blood loss anemia: Hgb 12.0 --> 11.2. pt is taking ASA-caffeine prn.  Per ED physician, patient has hemorrhoid on rectal examination.  - IVF: as above - Start IV pantoprazole 40 mg bid - Zofran IV for nausea - Avoid NSAIDs and SQ heparin - hold off ASA - Maintain IV access (2 large bore IVs if possible). - Monitor closely and follow q6h cbc, transfuse as necessary, if Hgb<7.0 - LaB: INR, PTT and type screen - please call GI in AM  Abdominal pain: Etiology is not clear.  CT abdomen/pelvis is negative for acute intra-abdominal issues.  Liver function normal.  Lipase normal.  Potential differential diagnosis is gastric ulcer. -Patient is on IV Protonix -As needed Percocet, Tylenol, Dilaudid  Hyperkalemia: Potassium 7.2 -Patient was given 1 g calcium gluconate, NovoLog 10 unit, Lasix of 40 mg, and Lokelma 10 g in ED -IV fluid as above -Patient is on insulin drip -BMP every 4 hours -Hold lisinopril  Chronic diastolic CHF (congestive heart failure) [facility]): Patient had history of systolic CHF with EF of 30-35% by 2D echo on 07/29/2016, but now has diastolic CHF with EF> 55% by 2D echo on 03/10/2021.  Patient has 1+ left leg edema, but no worsening shortness of breath, no pulm edema chest x-ray.  BNP 47.  Does not seem to have CHF exacerbation. -Hold Lasix since patient need IV fluid for HSS -Watch volume status closely   Elevated lactic acid level: Lactic acid 2.6, patient has left lower leg wound infection, but does not meet criteria for sepsis.  Elevated lactic acid is likely due to dehydration secondary to HHS. -trend Lactic acid level -IV fluid as above  HTN (hypertension) -IV hydralazine as needed -Hold lisinopril due to hyperkalemia -Metoprolol  HLD  (hyperlipidemia) -Lipitor  Wound infection_ left lower leg: WBC 10.7, but no fever.  Not meets criteria for sepsis. -Start IV Rocephin -Blood culture -Wound care consult  Depression with anxiety -Continue home medications  Morbid obesity (HCC): Body weight was 70.6 kg, BMI 52.44 -Encourage losing weight -Exercise and healthy diet      DVT ppx: SCD  Code Status: Full code    Family Communication: not done, no family member is at bed side.      Disposition Plan:  Anticipate discharge back to previous environment  Consults called:  none  Admission status and Level of care: Stepdown:  for obs    Dispo: The patient is from: Home              Anticipated d/c is to: Home              Anticipated d/c date is: 1 day              Patient currently is not medically stable to d/c.    Severity of Illness:  The appropriate patient status for this patient is OBSERVATION. Observation status is judged to be reasonable and necessary in order to provide the required intensity of service to ensure the patient's safety. The patient's presenting symptoms, physical exam findings, and initial radiographic and laboratory data in the context of their medical condition is felt to place them at decreased risk for further clinical deterioration. Furthermore, it is anticipated that the patient will be medically stable for discharge from the hospital within 2 midnights of admission.        Date of Service 10/13/2022    Lorretta Harp [facility]   If 7PM-7AM, please contact night-coverage [email or web address] 10/13/2022, 2:05 AM

## 2022-10-13 NOTE — Consult Note (Addendum)
NAME:  Russell [name], MRN:  [id number], DOB:  08-29-1965, LOS: 0 ADMISSION DATE:  10/12/2022, CONSULTATION DATE:  10/13/22 REFERRING MD:  Dr. Arville Care, CHIEF COMPLAINT:  Abdominal pain, rectal bleeding, LLE wounds  History of Present Illness:  57 yo M presenting to [facility] ED on 10/12/22 for evaluation of abdominal pain.  History provided per chart review and bedside patient report. Patient reports 7/10 constant/ aching/ severe abdominal pain for 2 days in the RUQ that is associated with nausea. Patient denied radiation of pain, denied vomiting but does endorse constipation. He reported taking milk of magnesia and having one episode of non-bloody diarrhea at home. He denied chest pain, dyspnea, cough, fever/chills or urinary symptoms. He does endorse chronic LLE pain as well as increased LLE edema over the last month. He reports his glucose can get high around 600 at home, then he takes 70-80 units and his glucose usually drops to around 250. He denies any other recent complaints.  ED course: Patient alert and oriented with stable vital signs on arrival, with the exception of mild tachypnea. He had an episode of hematochezia in the ED. Labs significant for lactic acidosis with very mild leukocytosis, pseudohyponatremia, hyperkalemia, hypoalbuminemia, hyperglycemia without anion gap acidosis and hyperosmolality. Imaging WNL. Initial Vitals: 98.2, 24, 77, 124/76 & 98% on RA Significant labs: (Labs/ Imaging personally reviewed) I, Cheryll Cockayne Rust-Chester, AGACNP-BC, personally viewed and interpreted this ECG. EKG Interpretation: Date: 10/12/22, EKG Time: 21:11, Rate: 77, Rhythm: NSR, QRS Axis:  LAD Intervals: normal, ST/T Wave abnormalities: peaked T waves, Narrative Interpretation: NSR with LAD and peaked T waves in the setting of hyperkalemia Chemistry: Na+: 129, K+: 7.2, BUN/Cr.: 24/ 1.14, Serum CO2/ AG: 25/9, albumin: 2.8 Hematology: WBC: 10.7, Hgb: 11.2,  Troponin: 12, BNP: 47, Lactic/ PCT: 2.3 > 2.6/  <0.10, COVID-19: negative  CXR 10/12/22: cardiac enlargement, no acute cardiopulmonary process CT abdomen/pelvis w contrast 10/13/22: There is an area of focal subcutaneous induration identified in the right anterior abdominal wall best seen on image number 30 of series 2. When compared with multiple previous exams dating back to 2020 this area has been present and likely represents an area of subcutaneous scarring or fat necrosis  Patient admitted to SDU with Hyperglycemia on an insulin drip in the setting of poorly controlled T2DM and suspected ABLA s/t hematochezia. After admission, Hemoglobin continues to drop without additional signs of bleeding. PCCM consulted as patient became hypotensive requiring peripheral vasopressor support while awaiting blood transfusion.  Pertinent  Medical History  HTN HLD T2DM HFrEF (2018) resolved HFpEF Depression & anxiety A-flutter not on anticoagulation Morbid obesity Chronic venous insufficiency s/p Right BKA  Significant Hospital Events: Including procedures, antibiotic start and stop dates in addition to other pertinent events   10/13/22: Admit to SDU with Hyperosmolar Hyperglycemia requiring insulin drip and suspected ABLA on protonix drip.  Interim History / Subjective:  Patient alert and responsive, only current complaint is feeling overheated.  Objective   Blood pressure (!) 81/41, pulse 84, temperature 97.8 F (36.6 C), temperature source Oral, resp. rate 14, height 5\' 11"  (1.803 m), weight (!) 163.5 kg, SpO2 91%.        Intake/Output Summary (Last 24 hours) at 10/13/2022 0427 Last data filed at 10/13/2022 0326 Gross per 24 hour  Intake 1781.49 ml  Output --  Net 1781.49 ml   Filed Weights   10/12/22 2111 10/13/22 0230  Weight: (!) 170.6 kg (!) 163.5 kg    Examination: General: Adult male, critically ill, lying  in bed, NAD HEENT: MM pink/moist, anicteric, atraumatic, neck supple Neuro: A&O x 4, able to follow commands, PERRL +3,  MAE CV: s1s2 RRR, NSR on monitor, no r/m/g Pulm: Regular, non labored on RA , breath sounds clear-BUL & diminished-BLL GI: soft, rounded, non tender, bs x 4 Skin: chronic LLE wounds noted Extremities: warm/dry, pulses + 2 R/P, +1 edema noted LLE, R BKA  Resolved Hospital Problem list   Abdominal pain  Assessment & Plan:  Circulatory Shock suspect secondary to hypovolemia in the setting of suspected GIB & HHNKS - continue peripheral levophed, wean as tolerated to maintain MAP > 65 - f/u STAT Lactic, VBG, CMP, CBC, PCT - additional 250 mL bolus ordered while awaiting blood transfusion  ABLA s/t suspected Gastrointestinal Bleed  No signs of symptoms of bleeding after one episode of hematochezia in ED. Hemorrhoid on ED exam. Patient does take ASA outpatient, not on chronic anticoagulation - continue Protonix infusion - Maintain up to date type & screen - continuous cardiac monitoring - NPO - Monitor for s/s of bleeding - Daily CBC, PT/ INR monitoring PRN - Transfuse for Hgb <7, however will transfuse 1 unit pRBC's as Hgb continues to fall with new hypotension. Consider CT angio GIB - GI consulted, appreciate input  Lactic Acidosis & mild leukocytosis Chronic LLE wounds Does not meet Sepsis criteria, agree that elevated lactic most likely s/t HHNKS & ABLA - continue Rocephin - trend WBC/fever curve - f/u lactic & PCT - follow cultures - WOC consulted, appreciate input  Hyperosmolar Hyperglycemia  PMHx: Poorly controlled T2DM HA1C: 11.8 - IV hydration, when blood glucose falls below 250 add D5 to IV fluids > increase IVF resuscitation to 125 mL/h, add additional 250 mL bolus - Insulin drip ordered per Endo tool protocol - Q 4 BMP, closely monitor potassium, replace electrolytes PRN - monitor blood glucose every 1 hour, per Endo tool protocol - Diabetes coordinator consult - Resume home insulin regiment once appropriate  Acute Kidney Injury Hyperkalemia - improving  Patient  received lokelma, ca+ gluc, inuslin & lasix - Strict I/O's: alert provider if UOP < 0.5 mL/kg/hr - IVF hydration see HHNKS protocol - Daily BMP, replace electrolytes PRN - Avoid nephrotoxic agents as able, ensure adequate renal perfusion  Chronic HFpEF without exacerbation Hyperlipidemia - Daily weights to assess volume status - hold further lasix as well as lopressor at this time due to hypotension, consider restarting as patient stabilizes - continue lipitor  Depression & Anxiety - continue outpatient medications  Best Practice (right click and "Reselect all SmartList Selections" daily)  Diet/type: NPO w/ oral meds DVT prophylaxis: SCD GI prophylaxis: PPI Lines: N/A Foley:  N/A Code Status:  full code Last date of multidisciplinary goals of care discussion [10/13/22]  Labs   CBC: Recent Labs  Lab 10/12/22 2216 10/13/22 0229  WBC 10.7* 8.6  NEUTROABS 8.6*  --   HGB 11.2* 9.0*  HCT 35.7* 28.7*  MCV 82.1 82.5  PLT 244 228    Basic Metabolic Panel: Recent Labs  Lab 10/12/22 2216 10/13/22 0229  NA 129* 132*  K 7.2* 6.0*  CL 95* 99  CO2 25 25  GLUCOSE 600* 566*  BUN 27* 32*  CREATININE 1.14 1.33*  CALCIUM 8.3* 7.9*   GFR: Estimated Creatinine Clearance: 94.7 mL/min (A) (by C-G formula based on SCr of 1.33 mg/dL (H)). Recent Labs  Lab 10/12/22 0137 10/12/22 2216 10/13/22 0229  PROCALCITON  --  <0.10  --   WBC  --  10.7*  8.6  LATICACIDVEN 2.3* 2.6*  --     Liver Function Tests: Recent Labs  Lab 10/12/22 2216  AST 18  ALT 16  ALKPHOS 97  BILITOT 0.7  PROT 6.3*  ALBUMIN 2.8*   Recent Labs  Lab 10/12/22 2216  LIPASE 24   No results for input(s): "AMMONIA" in the last 168 hours.  ABG    Component Value Date/Time   PHART 7.48 (H) 08/09/2016 1559   PCO2ART 46 08/09/2016 1559   PO2ART 75 (L) 08/09/2016 1559   HCO3 34.3 (H) 08/09/2016 1559   O2SAT 95.9 08/09/2016 1559     Coagulation Profile: Recent Labs  Lab 10/13/22 0229  INR 1.2     Cardiac Enzymes: No results for input(s): "CKTOTAL", "CKMB", "CKMBINDEX", "TROPONINI" in the last 168 hours.  HbA1C: Hemoglobin A1C  Date/Time Value Ref Range Status  06/07/2014 04:16 AM 10.4 (H) % Final    Comment:    4.0-6.0 NOTE: New Reference Range  05/07/14   06/05/2014 10:41 PM 10.4 (H) % Final    Comment:    4.0-6.0 NOTE: New Reference Range  05/07/14    Hgb A1c MFr Bld  Date/Time Value Ref Range Status  11/21/2020 06:00 PM 11.8 (H) 4.8 - 5.6 % Final    Comment:    (NOTE)         Prediabetes: 5.7 - 6.4         Diabetes: >6.4         Glycemic control for adults with diabetes: <7.0   06/02/2018 04:04 AM 10.1 (H) 4.8 - 5.6 % Final    Comment:    (NOTE) Pre diabetes:          5.7%-6.4% Diabetes:              >6.4% Glycemic control for   <7.0% adults with diabetes     CBG: Recent Labs  Lab 10/13/22 0135 10/13/22 0239 10/13/22 0309 10/13/22 0345 10/13/22 0419  GLUCAP 264* 571* 488* 439* 453*    Review of Systems: Positives in BOLD  Gen: Denies fever, chills, weight change, fatigue, night sweats HEENT: Denies blurred vision, double vision, hearing loss, tinnitus, sinus congestion, rhinorrhea, sore throat, neck stiffness, dysphagia PULM: Denies shortness of breath, cough, sputum production, hemoptysis, wheezing CV: Denies chest pain, edema, orthopnea, paroxysmal nocturnal dyspnea, palpitations GI: Denies abdominal pain, nausea (now resolved), vomiting, diarrhea, hematochezia, melena, constipation, change in bowel habits GU: Denies dysuria, hematuria, polyuria, oliguria, urethral discharge Endocrine: Denies hot or cold intolerance, polyuria, polyphagia or appetite change Derm: Denies rash, dry skin, scaling or peeling skin change Heme: Denies easy bruising, bleeding, bleeding gums Neuro: Denies headache, numbness, weakness, slurred speech, loss of memory or consciousness  Past Medical History:  He,  has a past medical history of Anxiety, CHF  (congestive heart failure) (HCC), Chronic diastolic (congestive) heart failure (HCC), Depression, Diabetes mellitus without complication (HCC), Hyperlipemia, Hypertension, Morbid obesity (HCC), and Peripheral edema.   Surgical History:   Past Surgical History:  Procedure Laterality Date   APPENDECTOMY     BELOW KNEE LEG AMPUTATION     Right leg   TONSILLECTOMY       Social History:   reports that he has never smoked. He has never used smokeless tobacco. He reports that he does not drink alcohol and does not use drugs.   Family History:  His family history includes CAD in his father; COPD in his mother; Diabetes in his father; Kidney cancer in his brother.   Allergies Allergies  Allergen Reactions   Fluoxetine     Other reaction(s): Hallucination   Shellfish Allergy Hives   Sulfa Antibiotics Hives   Vancomycin     Other reaction(s): Red Man Syndrome   Metformin Nausea Only     Home Medications  Prior to Admission medications   Medication Sig Start Date End Date Taking? Authorizing Provider  Aspirin-Caffeine 400-32 MG TABS Take 1 tablet by mouth 2 (two) times daily as needed (pain/headache).   Yes [provider]  atorvastatin (LIPITOR) 40 MG tablet Take 40 mg by mouth daily.    Yes [provider]  cetirizine (ZYRTEC) 10 MG tablet Take 10 mg by mouth daily as needed for allergies. 06/23/20  Yes [provider]  collagenase (SANTYL) ointment Apply topically daily. Patient taking differently: Apply 1 application  topically daily as needed (ulcers/burns). 02/02/18  Yes Mody, Patricia Pesa, MD  DULoxetine HCl 30 MG CSDR Take 60 mg by mouth in the morning, at noon, and at bedtime. 11/08/17  Yes [provider]  furosemide (LASIX) 40 MG tablet Take 80 mg by mouth 2 (two) times daily.   Yes [provider]  HUMULIN R 100 UNIT/ML injection Inject 80 Units into the skin 3 (three) times daily before meals. 10/11/22  Yes [provider]   lisinopril (PRINIVIL,ZESTRIL) 10 MG tablet Take 10 mg by mouth 2 (two) times daily.   Yes [provider]  metoprolol succinate (TOPROL-XL) 100 MG 24 hr tablet Take 100 mg by mouth daily.   Yes [provider]  potassium chloride SA (K-DUR,KLOR-CON) 20 MEQ tablet Take 20 mEq by mouth 2 (two) times daily as needed (cramps). 11/08/17  Yes [provider]  pregabalin (LYRICA) 150 MG capsule Take 150 mg by mouth 3 (three) times daily.    Yes [provider]  carvedilol (COREG) 6.25 MG tablet Take 6.25 mg by mouth 2 (two) times daily with a meal. Patient not taking: Reported on 10/12/2022    [provider]  clotrimazole (LOTRIMIN) 1 % cream Apply 1 application topically 2 (two) times daily. Patient not taking: Reported on 11/21/2020 06/03/18   Houston Siren, MD  fluticasone [facility]) 50 MCG/ACT nasal spray Place 2 sprays into both nostrils daily as needed for allergies. Patient not taking: Reported on 10/12/2022 11/14/20   [provider]  hydrocortisone (ANUSOL-HC) 2.5 % rectal cream Place 1 application rectally 2 (two) times daily. Patient not taking: Reported on 11/21/2020 07/04/19   Henrene Dodge, MD  insulin regular human CONCENTRATED (HUMULIN R U-500 KWIKPEN) 500 UNIT/ML KwikPen Inject 0-130 Units into the skin with breakfast, with lunch, and with evening meal. Per sliding scale Patient not taking: Reported on 10/12/2022    [provider]  lidocaine (XYLOCAINE) 5 % ointment Apply 1 application topically as needed. Patient not taking: Reported on 11/21/2020 07/04/19   Henrene Dodge, MD  meloxicam (MOBIC) 15 MG tablet Take 15 mg by mouth daily as needed for pain. Patient not taking: Reported on 10/12/2022    [provider]  oxyCODONE (ROXICODONE) 5 MG immediate release tablet Take 1 tablet (5 mg total) by mouth every 8 (eight) hours as needed for up to 12 doses for severe pain or breakthrough pain. Patient not taking: Reported on  10/12/2022 10/06/22   Pilar Jarvis, MD  senna-docusate (SENOKOT-S) 8.6-50 MG tablet Take 2 tablets by mouth daily as needed for mild constipation.    [provider]  torsemide (DEMADEX) 20 MG tablet Take 1 tablet (20 mg total) by  mouth daily. Patient not taking: Reported on 11/21/2020 06/03/18   Houston Siren, MD     Critical care time: 65 minutes       Cheryll Cockayne Rust-Chester, AGACNP-BC Acute Care Nurse Practitioner [facility]   [phone] / [phone] Please see Amion for details.

## 2022-10-13 NOTE — Assessment & Plan Note (Addendum)
Resolved.  Likely with gastric ulcers and gastritis.

## 2022-10-13 NOTE — Assessment & Plan Note (Signed)
On Cymbalta 

## 2022-10-13 NOTE — Assessment & Plan Note (Signed)
BMI 50.27

## 2022-10-13 NOTE — Assessment & Plan Note (Signed)
On Lipitor 

## 2022-10-13 NOTE — Progress Notes (Signed)
Progress Note   Patient: Russell [name] MWN:[id number] DOB: Sep 25, 1965 DOA: 10/12/2022     0 DOS: the patient was seen and examined on 10/13/2022   Brief hospital course: 57 y.o. male with medical history significant of hypertension, hyperlipidemia, diabetes mellitus,dCHF, depression with anxiety, A-flutter not on anticoagulants, morbid obesity, right BKA, chronic venous insufficiency in leg, who presents with abdominal pain, rectal bleeding, left lower leg wounds,   Patient states he has abdominal pain for more than 2 days, which is located in the upper abdomen, constant, aching, 7 out of 10 in severity, nonradiating.  It is not aggravated or alleviated by any known factors.  Associated with nausea, no vomiting.  8/14.  Patient received a unit of blood in the early morning.  Had a black stool when I saw him.  Patient had epigastric pain coming in.  Notified gastroenterology team.  Patient on low-dose Levophed this morning.  Assessment and Plan: * Hypovolemic shock (HCC) Patient required a blood transfusion early morning and Levophed.  Continue to monitor blood counts closely.  Upper GI bleed With black stool this morning and suspect upper GI bleed.  Gastroenterology consultation.  Continue IV Protonix.  Acute blood loss anemia Hemoglobin dropped from 11.3 down to 8.3.  Received a unit of blood and hemoglobin up to 9.8.  I suspect upper GI bleed with black stool.  Continue Protonix.  Hyperosmolar hyperglycemic state (HHS) (HCC) Continue insulin drip.  Hyperkalemia Lokelma, IV calcium, sodium bicarb push.  Patient already on insulin drip.  Elevated lactic acid level On IV fluid  Chronic diastolic CHF (congestive heart failure) (HCC) No signs of heart failure currently  HLD (hyperlipidemia) On Lipitor  Wound infection_ left lower leg On IV Rocephin  Depression with anxiety On Cymbalta  Morbid obesity (HCC) BMI 50.27  Sciatica of right side On Lyrica and pain  medication  Epigastric abdominal pain Continue to monitor.        Subjective: Patient seen this morning.  He was on low-dose Levophed and just had a black bowel movement.  Patient coming in with epigastric pain also placed on insulin drip for uncontrolled sugars.  Has a wound on his left lower extremity.  Physical Exam: Vitals:   10/13/22 0645 10/13/22 0700 10/13/22 0800 10/13/22 0803  BP: (!) 93/48 (!) 91/50  112/64  Pulse: 73 73 94 75  Resp: 13 (!) 9 12 (!) 9  Temp:   97.7 F (36.5 C) 97.7 F (36.5 C)  TempSrc:   Oral Oral  SpO2: 100% 99% 100% 100%  Weight:      Height:       Physical Exam HENT:     Head: Normocephalic.     Mouth/Throat:     Pharynx: No oropharyngeal exudate.  Eyes:     General: Lids are normal.     Conjunctiva/sclera: Conjunctivae normal.  Cardiovascular:     Rate and Rhythm: Normal rate and regular rhythm.     Heart sounds: Normal heart sounds, S1 normal and S2 normal.  Pulmonary:     Breath sounds: No decreased breath sounds, wheezing, rhonchi or rales.  Abdominal:     Palpations: Abdomen is soft.     Tenderness: There is no abdominal tenderness.  Musculoskeletal:     Left lower leg: Swelling present.     Right Lower Extremity: Right leg is amputated below knee.  Skin:    General: Skin is warm.     Comments: Wound to left lower extremity anteriorly.  Neurological:  Mental Status: He is alert and oriented to person, place, and time.     Data Reviewed: Creatinine 1.6, potassium 6.2, sodium 132, lactic acid 3.7, hemoglobin 9.8, white blood cell count 14.7, platelet count 255.  Family Communication: Left message for wife  Disposition: Status is: Patient on Levophed this morning, transfuse 1 unit of packed red blood cells.  Will get gastroenterology consultation for upper GI bleed.  Patient on insulin drip.  Planned Discharge Destination: Home    Time spent: 35 minutes.  Patient is critically ill requiring pressor support.  Patient  also on insulin drip.  Treating life-threatening hyperkalemia.  Author: Alford Highland, MD 10/13/2022 11:23 AM  For on call review [email or web address].

## 2022-10-13 NOTE — Progress Notes (Signed)
 An USGPIV (ultrasound guided PIV) has been placed for short-term vasopressor infusion. A correctly placed ivWatch must be used when administering Vasopressors. Should this treatment be needed beyond 24 hours, central line access should be obtained.  It will be the responsibility of the bedside nurse to follow best practice to prevent extravasations.

## 2022-10-14 ENCOUNTER — Encounter
Admission: EM | Disposition: A | Discharge status: Home Health | Payer: Self-pay | Source: Home / Self Care | Attending: Internal Medicine

## 2022-10-14 ENCOUNTER — Inpatient Hospital Stay: Payer: 59 | Admitting: Registered Nurse

## 2022-10-14 ENCOUNTER — Encounter: Payer: Self-pay | Admitting: Internal Medicine

## 2022-10-14 DIAGNOSIS — E785 Hyperlipidemia, unspecified: Secondary | ICD-10-CM

## 2022-10-14 DIAGNOSIS — T148XXA Other injury of unspecified body region, initial encounter: Secondary | ICD-10-CM

## 2022-10-14 DIAGNOSIS — K922 Gastrointestinal hemorrhage, unspecified: Secondary | ICD-10-CM | POA: Diagnosis not present

## 2022-10-14 DIAGNOSIS — N179 Acute kidney failure, unspecified: Secondary | ICD-10-CM

## 2022-10-14 DIAGNOSIS — L089 Local infection of the skin and subcutaneous tissue, unspecified: Secondary | ICD-10-CM

## 2022-10-14 DIAGNOSIS — R1013 Epigastric pain: Secondary | ICD-10-CM | POA: Diagnosis not present

## 2022-10-14 DIAGNOSIS — M5431 Sciatica, right side: Secondary | ICD-10-CM

## 2022-10-14 DIAGNOSIS — K259 Gastric ulcer, unspecified as acute or chronic, without hemorrhage or perforation: Secondary | ICD-10-CM

## 2022-10-14 DIAGNOSIS — D62 Acute posthemorrhagic anemia: Secondary | ICD-10-CM | POA: Diagnosis not present

## 2022-10-14 DIAGNOSIS — R571 Hypovolemic shock: Secondary | ICD-10-CM | POA: Diagnosis not present

## 2022-10-14 HISTORY — PX: ESOPHAGOGASTRODUODENOSCOPY (EGD) WITH PROPOFOL: SHX5813

## 2022-10-14 HISTORY — PX: BIOPSY: SHX5522

## 2022-10-14 LAB — BASIC METABOLIC PANEL
Anion gap: 8 (ref 5–15)
Anion gap: 3 — ABNORMAL LOW (ref 5–15)
BUN: 32 mg/dL — ABNORMAL HIGH (ref 6–20)
BUN: 29 mg/dL — ABNORMAL HIGH (ref 6–20)
CO2: 23 mmol/L (ref 22–32)
CO2: 28 mmol/L (ref 22–32)
Calcium: 8 mg/dL — ABNORMAL LOW (ref 8.9–10.3)
Calcium: 8 mg/dL — ABNORMAL LOW (ref 8.9–10.3)
Chloride: 101 mmol/L (ref 98–111)
Chloride: 104 mmol/L (ref 98–111)
Creatinine, Ser: 1.12 mg/dL (ref 0.61–1.24)
Creatinine, Ser: 1.06 mg/dL (ref 0.61–1.24)
GFR, Estimated: >60 mL/min (ref >60)
GFR, Estimated: >60 mL/min (ref >60)
Glucose, Bld: 149 mg/dL — ABNORMAL HIGH (ref 70–99)
Glucose, Bld: 146 mg/dL — ABNORMAL HIGH (ref 70–99)
Potassium: 4.7 mmol/L (ref 3.5–5.1)
Potassium: 4.3 mmol/L (ref 3.5–5.1)
Sodium: 132 mmol/L — ABNORMAL LOW (ref 135–145)
Sodium: 135 mmol/L (ref 135–145)

## 2022-10-14 LAB — CBC
HCT: 25.8 % — ABNORMAL LOW (ref 39.0–52.0)
HCT: 24.6 % — ABNORMAL LOW (ref 39.0–52.0)
Hemoglobin: 8.6 g/dL — ABNORMAL LOW (ref 13.0–17.0)
Hemoglobin: 8.2 g/dL — ABNORMAL LOW (ref 13.0–17.0)
MCH: 27.3 pg (ref 26.0–34.0)
MCH: 26.9 pg (ref 26.0–34.0)
MCHC: 33.3 g/dL (ref 30.0–36.0)
MCHC: 33.3 g/dL (ref 30.0–36.0)
MCV: 81.9 fL (ref 80.0–100.0)
MCV: 80.7 fL (ref 80.0–100.0)
Platelets: 195 10*3/uL (ref 150–400)
Platelets: 194 10*3/uL (ref 150–400)
RBC: 3.15 MIL/uL — ABNORMAL LOW (ref 4.22–5.81)
RBC: 3.05 MIL/uL — ABNORMAL LOW (ref 4.22–5.81)
RDW: 14.6 % (ref 11.5–15.5)
RDW: 14.5 % (ref 11.5–15.5)
WBC: 9.3 10*3/uL (ref 4.0–10.5)
WBC: 8.4 10*3/uL (ref 4.0–10.5)
nRBC: 0 % (ref 0.0–0.2)
nRBC: 0 % (ref 0.0–0.2)

## 2022-10-14 LAB — PREPARE RBC (CROSSMATCH)

## 2022-10-14 LAB — GLUCOSE, CAPILLARY
Glucose-Capillary: 126 mg/dL — ABNORMAL HIGH (ref 70–99)
Glucose-Capillary: 134 mg/dL — ABNORMAL HIGH (ref 70–99)
Glucose-Capillary: 135 mg/dL — ABNORMAL HIGH (ref 70–99)
Glucose-Capillary: 137 mg/dL — ABNORMAL HIGH (ref 70–99)
Glucose-Capillary: 144 mg/dL — ABNORMAL HIGH (ref 70–99)
Glucose-Capillary: 148 mg/dL — ABNORMAL HIGH (ref 70–99)
Glucose-Capillary: 151 mg/dL — ABNORMAL HIGH (ref 70–99)
Glucose-Capillary: 155 mg/dL — ABNORMAL HIGH (ref 70–99)
Glucose-Capillary: 226 mg/dL — ABNORMAL HIGH (ref 70–99)
Glucose-Capillary: 308 mg/dL — ABNORMAL HIGH (ref 70–99)
Glucose-Capillary: 322 mg/dL — ABNORMAL HIGH (ref 70–99)

## 2022-10-14 LAB — PHOSPHORUS: Phosphorus: 4.6 mg/dL (ref 2.5–4.6)

## 2022-10-14 LAB — HEMOGLOBIN A1C
Hgb A1c MFr Bld: 12 % — ABNORMAL HIGH (ref 4.8–5.6)
Mean Plasma Glucose: 298 mg/dL

## 2022-10-14 LAB — MAGNESIUM: Magnesium: 2.3 mg/dL (ref 1.7–2.4)

## 2022-10-14 LAB — HEMOGLOBIN: Hemoglobin: 7.5 g/dL — ABNORMAL LOW (ref 13.0–17.0)

## 2022-10-14 LAB — LACTIC ACID, PLASMA: Lactic Acid, Venous: 0.8 mmol/L (ref 0.5–1.9)

## 2022-10-14 SURGERY — ESOPHAGOGASTRODUODENOSCOPY (EGD) WITH PROPOFOL
Anesthesia: General

## 2022-10-14 MED ORDER — PHENOL 1.4 % MT LIQD
1.0000 | OROMUCOSAL | Status: DC | PRN
  Administered 2022-10-14: 1 via OROMUCOSAL
  Filled 2022-10-14: qty 177

## 2022-10-14 MED ORDER — LIDOCAINE HCL (CARDIAC) PF 100 MG/5ML IV SOSY
PREFILLED_SYRINGE | INTRAVENOUS | Status: DC | PRN
  Administered 2022-10-14: 50 mg via INTRAVENOUS

## 2022-10-14 MED ORDER — TAMSULOSIN HCL 0.4 MG PO CAPS
0.4000 mg | ORAL_CAPSULE | Freq: Every day | ORAL | Status: DC
  Administered 2022-10-14 – 2022-10-19 (×6): 0.4 mg via ORAL
  Filled 2022-10-14 (×6): qty 1

## 2022-10-14 MED ORDER — ONDANSETRON HCL 4 MG/2ML IJ SOLN
INTRAMUSCULAR | Status: AC
  Filled 2022-10-14: qty 2

## 2022-10-14 MED ORDER — SUCCINYLCHOLINE CHLORIDE 200 MG/10ML IV SOSY
PREFILLED_SYRINGE | INTRAVENOUS | Status: DC | PRN
  Administered 2022-10-14: 200 mg via INTRAVENOUS

## 2022-10-14 MED ORDER — PROPOFOL 10 MG/ML IV BOLUS
INTRAVENOUS | Status: DC | PRN
  Administered 2022-10-14: 200 mg via INTRAVENOUS

## 2022-10-14 MED ORDER — INSULIN ASPART 100 UNIT/ML IJ SOLN
0.0000 [IU] | INTRAMUSCULAR | Status: DC
  Administered 2022-10-14: 3 [IU] via SUBCUTANEOUS
  Administered 2022-10-14 (×2): 15 [IU] via SUBCUTANEOUS
  Administered 2022-10-15 (×4): 7 [IU] via SUBCUTANEOUS
  Administered 2022-10-15: 4 [IU] via SUBCUTANEOUS
  Administered 2022-10-15: 7 [IU] via SUBCUTANEOUS
  Administered 2022-10-16 (×2): 4 [IU] via SUBCUTANEOUS
  Administered 2022-10-16: 7 [IU] via SUBCUTANEOUS
  Filled 2022-10-14 (×12): qty 1

## 2022-10-14 MED ORDER — INSULIN DETEMIR 100 UNIT/ML ~~LOC~~ SOLN
30.0000 [IU] | Freq: Two times a day (BID) | SUBCUTANEOUS | Status: DC
  Administered 2022-10-14: 30 [IU] via SUBCUTANEOUS
  Filled 2022-10-14 (×2): qty 0.3

## 2022-10-14 MED ORDER — INSULIN DETEMIR 100 UNIT/ML ~~LOC~~ SOLN
45.0000 [IU] | Freq: Two times a day (BID) | SUBCUTANEOUS | Status: DC
  Administered 2022-10-14 – 2022-10-15 (×3): 45 [IU] via SUBCUTANEOUS
  Filled 2022-10-14 (×4): qty 0.45

## 2022-10-14 MED ORDER — SUCCINYLCHOLINE CHLORIDE 200 MG/10ML IV SOSY
PREFILLED_SYRINGE | INTRAVENOUS | Status: AC
  Filled 2022-10-14: qty 10

## 2022-10-14 MED ORDER — SODIUM CHLORIDE 0.9% IV SOLUTION: Freq: Once | INTRAVENOUS | Status: DC

## 2022-10-14 MED ORDER — FUROSEMIDE 10 MG/ML IJ SOLN
20.0000 mg | Freq: Once | INTRAMUSCULAR | Status: AC
  Administered 2022-10-14: 20 mg via INTRAVENOUS
  Filled 2022-10-14: qty 2

## 2022-10-14 MED ORDER — INSULIN ASPART 100 UNIT/ML IJ SOLN: 0.0000 [IU] | INTRAMUSCULAR | Status: DC

## 2022-10-14 MED ORDER — ACETAMINOPHEN 325 MG PO TABS
650.0000 mg | ORAL_TABLET | Freq: Once | ORAL | Status: AC
  Administered 2022-10-14: 650 mg via ORAL
  Filled 2022-10-14: qty 2

## 2022-10-14 MED ORDER — ONDANSETRON HCL 4 MG/2ML IJ SOLN
INTRAMUSCULAR | Status: DC | PRN
Start: 2022-10-14 — End: 2022-10-14
  Administered 2022-10-14: 4 mg via INTRAVENOUS

## 2022-10-14 MED ORDER — MENTHOL 3 MG MT LOZG
1.0000 | LOZENGE | OROMUCOSAL | Status: DC | PRN
  Administered 2022-10-14: 3 mg via ORAL
  Filled 2022-10-14 (×2): qty 9

## 2022-10-14 NOTE — Anesthesia Procedure Notes (Signed)
Procedure Name: Intubation Date/Time: 10/14/2022 12:04 PM  Performed by: Omer Jack, CRNAPre-anesthesia Checklist: Patient identified, Patient being monitored, Timeout performed, Emergency Drugs available and Suction available Patient Re-evaluated:Patient Re-evaluated prior to induction Oxygen Delivery Method: Circle system utilized Preoxygenation: Pre-oxygenation with 100% oxygen Induction Type: IV induction Ventilation: Mask ventilation without difficulty Laryngoscope Size: 4 and McGraph Grade View: Grade I Tube type: Oral Tube size: 6.5 mm Number of attempts: 1 Airway Equipment and Method: Stylet Placement Confirmation: ETT inserted through vocal cords under direct vision, positive ETCO2 and breath sounds checked- equal and bilateral Secured at: 21 cm Tube secured with: Tape Dental Injury: Teeth and Oropharynx as per pre-operative assessment

## 2022-10-14 NOTE — Interval H&P Note (Signed)
History and Physical Interval Note:  10/14/2022 11:58 AM  Russell [name]  has presented today for surgery, with the diagnosis of melena.  The various methods of treatment have been discussed with the patient and family. After consideration of risks, benefits and other options for treatment, the patient has consented to  Procedure(s): ESOPHAGOGASTRODUODENOSCOPY (EGD) WITH PROPOFOL (N/A) as a surgical intervention.  The patient's history has been reviewed, patient examined, no change in status, stable for surgery.  I have reviewed the patient's chart and labs.  Questions were answered to the patient's satisfaction.     [place], [place]

## 2022-10-14 NOTE — Plan of Care (Signed)
  Problem: Education: Goal: Ability to describe self-care measures that may prevent or decrease complications (Diabetes Survival Skills Education) will improve Outcome: Progressing   Problem: Coping: Goal: Ability to adjust to condition or change in health will improve Outcome: Progressing   Problem: Fluid Volume: Goal: Ability to maintain a balanced intake and output will improve Outcome: Progressing   Problem: Health Behavior/Discharge Planning: Goal: Ability to identify and utilize available resources and services will improve Outcome: Progressing Goal: Ability to manage health-related needs will improve Outcome: Progressing   Problem: Metabolic: Goal: Ability to maintain appropriate glucose levels will improve Outcome: Progressing   Problem: Nutritional: Goal: Maintenance of adequate nutrition will improve Outcome: Progressing Goal: Progress toward achieving an optimal weight will improve Outcome: Progressing   Problem: Skin Integrity: Goal: Risk for impaired skin integrity will decrease Outcome: Progressing   Problem: Tissue Perfusion: Goal: Adequacy of tissue perfusion will improve Outcome: Progressing   Problem: Education: Goal: Ability to describe self-care measures that may prevent or decrease complications (Diabetes Survival Skills Education) will improve Outcome: Progressing   Problem: Cardiac: Goal: Ability to maintain an adequate cardiac output will improve Outcome: Progressing   Problem: Fluid Volume: Goal: Ability to achieve a balanced intake and output will improve Outcome: Progressing   Problem: Metabolic: Goal: Ability to maintain appropriate glucose levels will improve Outcome: Progressing

## 2022-10-14 NOTE — Inpatient Diabetes Management (Signed)
Inpatient Diabetes Program Recommendations  AACE/ADA: New Consensus Statement on Inpatient Glycemic Control (2015)  Target Ranges:  Prepandial:   less than 140 mg/dL      Peak postprandial:   less than 180 mg/dL (1-2 hours)      Critically ill patients:  140 - 180 mg/dL   Lab Results  Component Value Date   GLUCAP 308 (H) 10/14/2022   HGBA1C 12.0 (H) 10/13/2022    Review of Glycemic Control  Latest Reference Range & Units 10/14/22 06:28 10/14/22 07:29 10/14/22 09:07 10/14/22 14:04 10/14/22 15:40  Glucose-Capillary 70 - 99 mg/dL 696 (H) 295 (H) 284 (H) 226 (H) 308 (H)  Diabetes history: DM 2 Outpatient Diabetes medications:  Regular insulin 80 units tid with meals (if blood sugars>180 mg/dL) Current orders for Inpatient glycemic control: Diabetes history: Levemir 30 units bid  Novolog resistant q 4 hours  Inpatient Diabetes Program Recommendations:    Briefly spoke to patient after procedure.  He verified that he does not take basal insulin and only takes Regular insulin tid.  He states that his last A1C was 11%.  RN checked blood sugar while I was at bedside and it is up to 308 mg/dL.  Told patient that MD would likely titrate insulin up and that he likely needs basal insulin added to regimen to help with A1C and glycemic control.  Discussed with pharmacist.  Will follow.   Thanks  Beryl Meager, RN, BC-ADM Inpatient Diabetes Coordinator Pager [phone]  (8a-5p)

## 2022-10-14 NOTE — Op Note (Signed)
[facility]astroenterology Patient Name: Russell [name] Procedure Date: 10/14/2022 11:15 AM MRN: [id number] Account #: [id number] Date of Birth: [birth year] Admit Type: Inpatient Age: 57 Room: [facility] ENDO ROOM 3 Gender: Male Note Status: Finalized Instrument Name: Upper Endoscope [id number] Procedure:             Upper GI endoscopy Indications:           Acute post hemorrhagic anemia, Melena Providers:             Boykin Nearing. Norma Fredrickson MD, MD Referring MD:          [name] (Referring MD) Medicines:             Propofol per Anesthesia Complications:         No immediate complications. Estimated blood loss: None. Procedure:             Pre-Anesthesia Assessment:                        - The risks and benefits of the procedure and the                         sedation options and risks were discussed with the                         patient. All questions were answered and informed                         consent was obtained.                        - Patient identification and proposed procedure were                         verified prior to the procedure by the nurse. The                         procedure was verified in the procedure room.                        - ASA Grade Assessment: III - A patient with severe                         systemic disease.                        - After reviewing the risks and benefits, the patient                         was deemed in satisfactory condition to undergo the                         procedure.                        After obtaining informed consent, the endoscope was                         passed under direct vision. Throughout the procedure,  the patient's blood pressure, pulse, and oxygen                         saturations were monitored continuously. The Endoscope                         was introduced through the mouth, and advanced to the                         third part of duodenum.  The upper GI endoscopy was                         accomplished without difficulty. The patient tolerated                         the procedure well. Findings:      The esophagus was normal.      Patchy moderate inflammation characterized by congestion (edema),       erosions and erythema was found in the gastric antrum. Biopsies were       taken with a cold forceps for Helicobacter pylori testing.      Three non-bleeding cratered gastric ulcers with no stigmata of bleeding       were found in the gastric antrum. The largest lesion was 15 mm in       largest dimension.      The cardia and gastric fundus were normal on retroflexion.      The examined duodenum was normal. Impression:            - Normal esophagus.                        - Gastritis. Biopsied.                        - Non-bleeding gastric ulcers with no stigmata of                         bleeding.                        - Normal examined duodenum. Recommendation:        - Return patient to ICU for ongoing care.                        - Use Protonix (pantoprazole) 40 mg PO BID. Take for                         one month, then decrease to one tablet daily.                        -Await H Pylori results                        - Advance diet as tolerated.                        - No aspirin, ibuprofen, naproxen, or other                         non-steroidal anti-inflammatory drugs.                        -  The findings and recommendations were discussed with                         the patient.                        - [facility] will sign off for now. Call us back if any                         changes clinically or if we can help for any reason. Procedure Code(s):     --- Professional ---                        [phone], Esophagogastroduodenoscopy, flexible,                         transoral; with biopsy, single or multiple Diagnosis Code(s):     --- Professional ---                        K92.1, Melena (includes Hematochezia)                         D62, Acute posthemorrhagic anemia                        K25.9, Gastric ulcer, unspecified as acute or chronic,                         without hemorrhage or perforation                        K29.70, Gastritis, unspecified, without bleeding CPT copyright 2022 American [employer] Association. All rights reserved. The codes documented in this report are preliminary and upon coder review may  be revised to meet current compliance requirements. Stanton Kidney MD, MD 10/14/2022 12:19:06 PM This report has been signed electronically. Number of Addenda: 0 Note Initiated On: 10/14/2022 11:15 AM Estimated Blood Loss:  Estimated blood loss: none.      [facility]

## 2022-10-14 NOTE — Progress Notes (Signed)
Progress Note   Patient: Russell [name] HKV:[id number] DOB: 04/16/1965 DOA: 10/12/2022     1 DOS: the patient was seen and examined on 10/14/2022   Brief hospital course: 57 y.o. male with medical history significant of hypertension, hyperlipidemia, diabetes mellitus,dCHF, depression with anxiety, A-flutter not on anticoagulants, morbid obesity, right BKA, chronic venous insufficiency in leg, who presents with abdominal pain, rectal bleeding, left lower leg wounds,   Patient states he has abdominal pain for more than 2 days, which is located in the upper abdomen, constant, aching, 7 out of 10 in severity, nonradiating.  It is not aggravated or alleviated by any known factors.  Associated with nausea, no vomiting.  8/14.  Patient received a unit of blood in the early morning.  Had a black stool when I saw him.  Patient had epigastric pain coming in.  Notified gastroenterology team.  Patient on low-dose Levophed this morning. 8/15.  EGD showing gastritis and nonbleeding gastric ulcers.  Patient converted off insulin drip this morning.  Assessment and Plan: * Hypovolemic shock (HCC) Resolved.  Patient off Levophed.  Required 2 units of packed red blood cells.  Upper GI bleed EGD on 8/15 showed nonbleeding gastric ulcers and gastritis.  Continue IV Protonix today.  Gastroenterology did not feel that he needed a colonoscopy at this time.  Acute blood loss anemia Patient transfused 2 units of packed red blood cells.  Last hemoglobin 8.2.  Hyperosmolar hyperglycemic state (HHS) (HCC) Patient converted off insulin drip today.  Currently on Levemir insulin 30 units twice days plus sliding scale.  Will start full liquid diet and advance as tolerated.  Hyperkalemia Treated.  Potassium today 4.3.  Recheck BMP tomorrow.  Elevated lactic acid level Normalized.  Discontinue IV fluid  Epigastric abdominal pain Likely with gastric ulcers and gastritis.  Chronic diastolic CHF (congestive heart  failure) (HCC) No signs of heart failure currently  AKI (acute kidney injury) (HCC) Creatinine up to 1.6 during hospital course and down to 1.06.  Wound infection_ left lower leg On IV Rocephin  HLD (hyperlipidemia) On Lipitor  Sciatica of right side On Lyrica and pain medication  Depression with anxiety On Cymbalta  Morbid obesity (HCC) BMI 50.27        Subjective: Patient complains of pain down the right leg and pain in his knees.  Came in with hypovolemic shock.  Physical Exam: Vitals:   10/14/22 1057 10/14/22 1220 10/14/22 1230 10/14/22 1240  BP: (!) 126/56 (!) 144/53 (!) 131/59 (!) 117/53  Pulse: 85     Resp: 14 14 14 14   Temp: (!) 97.4 F (36.3 C) (!) 97.4 F (36.3 C)    TempSrc: Temporal Temporal    SpO2: 99%     Weight:      Height:       Physical Exam HENT:     Head: Normocephalic.     Mouth/Throat:     Pharynx: No oropharyngeal exudate.  Eyes:     General: Lids are normal.     Conjunctiva/sclera: Conjunctivae normal.  Cardiovascular:     Rate and Rhythm: Normal rate and regular rhythm.     Heart sounds: Normal heart sounds, S1 normal and S2 normal.  Pulmonary:     Breath sounds: No decreased breath sounds, wheezing, rhonchi or rales.  Abdominal:     Palpations: Abdomen is soft.     Tenderness: There is no abdominal tenderness.  Musculoskeletal:     Left lower leg: Swelling present.     Right  Lower Extremity: Right leg is amputated below knee.  Skin:    General: Skin is warm.     Comments: Wound to left lower extremity anteriorly.  Neurological:     Mental Status: He is alert and oriented to person, place, and time.     Data Reviewed: Creatinine 1.06, sodium 135, hemoglobin 8.2 Family Communication: Updated patient's wife on the phone  Disposition: Status is: Inpatient Remains inpatient appropriate because: Patient had EGD today.  Monitor blood pressure and hemoglobin today.  Planned Discharge Destination: Home    Time spent: 28  minutes  Author: Alford Highland, MD 10/14/2022 12:54 PM  For on call review [email or web address].

## 2022-10-14 NOTE — Anesthesia Postprocedure Evaluation (Signed)
Anesthesia Post Note  Patient: Russell [name]  Procedure(s) Performed: ESOPHAGOGASTRODUODENOSCOPY (EGD) WITH PROPOFOL BIOPSY  Patient location during evaluation: Endoscopy Anesthesia Type: General Level of consciousness: awake and alert Pain management: pain level controlled Vital Signs Assessment: post-procedure vital signs reviewed and stable Respiratory status: spontaneous breathing, nonlabored ventilation, respiratory function stable and patient connected to nasal cannula oxygen Cardiovascular status: blood pressure returned to baseline and stable Postop Assessment: no apparent nausea or vomiting Anesthetic complications: no   No notable events documented.   Last Vitals:  Vitals:   10/14/22 1230 10/14/22 1240  BP: (!) 131/59 (!) 117/53  Pulse:    Resp: 14 14  Temp:    SpO2:      Last Pain:  Vitals:   10/14/22 1240  TempSrc:   PainSc: 0-No pain                 Louie Boston

## 2022-10-14 NOTE — Anesthesia Preprocedure Evaluation (Signed)
Anesthesia Evaluation  Patient identified by MRN, date of birth, ID band Patient awake    Reviewed: Allergy & Precautions, NPO status , Patient's Chart, lab work & pertinent test results  History of Anesthesia Complications Negative for: history of anesthetic complications  Airway Mallampati: III  TM Distance: >3 FB Neck ROM: full    Dental no notable dental hx.    Pulmonary neg pulmonary ROS   Pulmonary exam normal        Cardiovascular hypertension, On Medications +CHF  Normal cardiovascular exam     Neuro/Psych  PSYCHIATRIC DISORDERS Anxiety Depression    negative neurological ROS     GI/Hepatic negative GI ROS, Neg liver ROS,,,  Endo/Other  diabetes  Morbid obesity  Renal/GU Renal disease  negative genitourinary   Musculoskeletal   Abdominal   Peds  Hematology  (+) Blood dyscrasia, anemia   Anesthesia Other Findings Past Medical History: No date: Anxiety No date: CHF (congestive heart failure) (HCC)     Comment:  EF 30-35% No date: Chronic diastolic (congestive) heart failure (HCC) No date: Depression No date: Diabetes mellitus without complication (HCC) No date: Hyperlipemia No date: Hypertension No date: Morbid obesity (HCC) No date: Peripheral edema  Past Surgical History: No date: APPENDECTOMY No date: BELOW KNEE LEG AMPUTATION     Comment:  Right leg No date: TONSILLECTOMY  BMI    Body Mass Index: 50.27 kg/m      Reproductive/Obstetrics negative OB ROS                             Anesthesia Physical Anesthesia Plan  ASA: 3  Anesthesia Plan: General   Post-op Pain Management: Minimal or no pain anticipated   Induction: Intravenous  PONV Risk Score and Plan: 1 and Propofol infusion and TIVA  Airway Management Planned: Natural Airway and Nasal Cannula  Additional Equipment:   Intra-op Plan:   Post-operative Plan:   Informed Consent: I have reviewed  the patients History and Physical, chart, labs and discussed the procedure including the risks, benefits and alternatives for the proposed anesthesia with the patient or authorized representative who has indicated his/her understanding and acceptance.     Dental Advisory Given  Plan Discussed with: Anesthesiologist, CRNA and Surgeon  Anesthesia Plan Comments: (Patient consented for risks of anesthesia including but not limited to:  - adverse reactions to medications - risk of airway placement if required - damage to eyes, teeth, lips or other oral mucosa - nerve damage due to positioning  - sore throat or hoarseness - Damage to heart, brain, nerves, lungs, other parts of body or loss of life  Patient voiced understanding.)       Anesthesia Quick Evaluation

## 2022-10-14 NOTE — Transfer of Care (Signed)
Immediate Anesthesia Transfer of Care Note  Patient: Russell [name]  Procedure(s) Performed: ESOPHAGOGASTRODUODENOSCOPY (EGD) WITH PROPOFOL BIOPSY  Patient Location: PACU and Endoscopy Unit  Anesthesia Type:General  Level of Consciousness: drowsy and patient cooperative  Airway & Oxygen Therapy: Patient Spontanous Breathing and Patient connected to nasal cannula oxygen  Post-op Assessment: Report given to RN and Post -op Vital signs reviewed and stable  Post vital signs: Reviewed and stable  Last Vitals:  Vitals Value Taken Time  BP 144/53 10/14/22 1222  Temp 36.3 C 10/14/22 1220  Pulse 95 10/14/22 1225  Resp 14 10/14/22 1220  SpO2 100 % 10/14/22 1225  Vitals shown include unfiled device data.  Last Pain:  Vitals:   10/14/22 1220  TempSrc: Temporal  PainSc: 0-No pain         Complications: No notable events documented.

## 2022-10-15 ENCOUNTER — Encounter: Payer: Self-pay | Admitting: Internal Medicine

## 2022-10-15 ENCOUNTER — Inpatient Hospital Stay: Payer: 59

## 2022-10-15 DIAGNOSIS — K259 Gastric ulcer, unspecified as acute or chronic, without hemorrhage or perforation: Secondary | ICD-10-CM

## 2022-10-15 DIAGNOSIS — E1100 Type 2 diabetes mellitus with hyperosmolarity without nonketotic hyperglycemic-hyperosmolar coma (NKHHC): Secondary | ICD-10-CM | POA: Diagnosis not present

## 2022-10-15 DIAGNOSIS — I5033 Acute on chronic diastolic (congestive) heart failure: Secondary | ICD-10-CM

## 2022-10-15 DIAGNOSIS — J9601 Acute respiratory failure with hypoxia: Secondary | ICD-10-CM

## 2022-10-15 DIAGNOSIS — R571 Hypovolemic shock: Secondary | ICD-10-CM | POA: Diagnosis not present

## 2022-10-15 DIAGNOSIS — D62 Acute posthemorrhagic anemia: Secondary | ICD-10-CM | POA: Diagnosis not present

## 2022-10-15 LAB — GLUCOSE, CAPILLARY
Glucose-Capillary: 238 mg/dL — ABNORMAL HIGH (ref 70–99)
Glucose-Capillary: 215 mg/dL — ABNORMAL HIGH (ref 70–99)
Glucose-Capillary: 175 mg/dL — ABNORMAL HIGH (ref 70–99)
Glucose-Capillary: 249 mg/dL — ABNORMAL HIGH (ref 70–99)
Glucose-Capillary: 218 mg/dL — ABNORMAL HIGH (ref 70–99)
Glucose-Capillary: 114 mg/dL — ABNORMAL HIGH (ref 70–99)

## 2022-10-15 LAB — CBC
HCT: 23.2 % — ABNORMAL LOW (ref 39.0–52.0)
Hemoglobin: 7.6 g/dL — ABNORMAL LOW (ref 13.0–17.0)
MCH: 27 pg (ref 26.0–34.0)
MCHC: 32.8 g/dL (ref 30.0–36.0)
MCV: 82.6 fL (ref 80.0–100.0)
Platelets: 182 10*3/uL (ref 150–400)
RBC: 2.81 MIL/uL — ABNORMAL LOW (ref 4.22–5.81)
RDW: 14.6 % (ref 11.5–15.5)
WBC: 10.9 10*3/uL — ABNORMAL HIGH (ref 4.0–10.5)
nRBC: 0.2 % (ref 0.0–0.2)

## 2022-10-15 LAB — PREPARE RBC (CROSSMATCH)

## 2022-10-15 LAB — BASIC METABOLIC PANEL
Anion gap: 6 (ref 5–15)
BUN: 15 mg/dL (ref 6–20)
CO2: 28 mmol/L (ref 22–32)
Calcium: 8.3 mg/dL — ABNORMAL LOW (ref 8.9–10.3)
Chloride: 102 mmol/L (ref 98–111)
Creatinine, Ser: 0.88 mg/dL (ref 0.61–1.24)
GFR, Estimated: >60 mL/min (ref >60)
Glucose, Bld: 206 mg/dL — ABNORMAL HIGH (ref 70–99)
Potassium: 4.6 mmol/L (ref 3.5–5.1)
Sodium: 136 mmol/L (ref 135–145)

## 2022-10-15 LAB — HEMOGLOBIN: Hemoglobin: 7.9 g/dL — ABNORMAL LOW (ref 13.0–17.0)

## 2022-10-15 MED ORDER — BISACODYL 5 MG PO TBEC
10.0000 mg | DELAYED_RELEASE_TABLET | Freq: Once | ORAL | Status: AC
  Administered 2022-10-15: 10 mg via ORAL
  Filled 2022-10-15: qty 2

## 2022-10-15 MED ORDER — SODIUM CHLORIDE 0.9 % IV SOLN
300.0000 mg | Freq: Once | INTRAVENOUS | Status: AC
  Administered 2022-10-15: 300 mg via INTRAVENOUS
  Filled 2022-10-15: qty 300

## 2022-10-15 MED ORDER — CEPHALEXIN 500 MG PO CAPS
500.0000 mg | ORAL_CAPSULE | Freq: Four times a day (QID) | ORAL | Status: DC
  Administered 2022-10-15 – 2022-10-19 (×14): 500 mg via ORAL
  Filled 2022-10-15 (×15): qty 1

## 2022-10-15 MED ORDER — FUROSEMIDE 10 MG/ML IJ SOLN
40.0000 mg | Freq: Once | INTRAMUSCULAR | Status: AC
  Administered 2022-10-15: 40 mg via INTRAVENOUS
  Filled 2022-10-15: qty 4

## 2022-10-15 MED ORDER — SODIUM CHLORIDE 0.9 % IV SOLN: INTRAVENOUS | Status: DC

## 2022-10-15 MED ORDER — PEG 3350-KCL-NA BICARB-NACL 420 G PO SOLR
4000.0000 mL | Freq: Once | ORAL | Status: AC
  Administered 2022-10-15: 4000 mL via ORAL
  Filled 2022-10-15: qty 4000

## 2022-10-15 MED ORDER — SODIUM CHLORIDE 0.9% IV SOLUTION: Freq: Once | INTRAVENOUS | Status: AC

## 2022-10-15 MED ORDER — ACETAMINOPHEN 325 MG PO TABS
650.0000 mg | ORAL_TABLET | Freq: Once | ORAL | Status: AC
  Administered 2022-10-15: 650 mg via ORAL
  Filled 2022-10-15: qty 2

## 2022-10-15 NOTE — Progress Notes (Signed)
Progress Note   Patient: Russell [name] PIR:[id number] DOB: [birth year] DOA: 10/12/2022     2 DOS: the patient was seen and examined on 10/15/2022   Brief hospital course: 57 y.o. male with medical history significant of hypertension, hyperlipidemia, diabetes mellitus,dCHF, depression with anxiety, A-flutter not on anticoagulants, morbid obesity, right BKA, chronic venous insufficiency in leg, who presents with abdominal pain, rectal bleeding, left lower leg wounds,   Patient states he has abdominal pain for more than 2 days, which is located in the upper abdomen, constant, aching, 7 out of 10 in severity, nonradiating.  It is not aggravated or alleviated by any known factors.  Associated with nausea, no vomiting.  8/14.  Patient received a unit of blood in the early morning.  Had a black stool when I saw him.  Patient had epigastric pain coming in.  Notified gastroenterology team.  Patient on low-dose Levophed this morning. 8/15.  EGD showing gastritis and nonbleeding gastric ulcers.  Patient converted off insulin drip this morning.  Afternoon hemoglobin down to 7.5 and given a unit of blood. 8/16.  Notified that patient had a maroon-colored stool.  A.m. hemoglobin 7.6.  Will give another unit of blood.  Lasix before and after blood transfusion.    Assessment and Plan: * Hypovolemic shock (HCC) Resolved.  Patient off Levophed.  Patient required 4 units of packed red blood cells during hospital course.  Upper GI bleed EGD on 8/15 showed nonbleeding gastric ulcers and gastritis.  Continue IV Protonix today.  Spoke with gastroenterology for reevaluation.  Switched to clear liquid diet today.  May end up needing a colonoscopy and/or small bowel study.  Acute blood loss anemia Transfusing 1 unit of packed red blood cells which is his fourth during the hospital course.  Hyperosmolar hyperglycemic state (HHS) (HCC) Patient converted off insulin drip 8/15.  Currently on Levemir insulin 45 units  twice days plus sliding scale.  Patient on clear liquid diet  Acute on chronic diastolic CHF (congestive heart failure) (HCC) With patient being put on oxygen I did give a dose of Lasix this morning likely fluid overload from blood and fluids.  Will give a another dose of Lasix after transfusion today.  Hyperkalemia Treated earlier in hospital course  Acute respiratory failure with hypoxia (HCC) Likely secondary to fluid overload (IV fluid and blood transfusions).  I decreased oxygen from 5 L down to 3 L and now on 2 L.  Check a pulse ox on room air.  Elevated lactic acid level Normalized.  Discontinued  Epigastric abdominal pain Likely with gastric ulcers and gastritis.  AKI (acute kidney injury) (HCC) Creatinine up to 1.6 during hospital course and down to 0.88  Wound infection_ left lower leg On IV Rocephin  HLD (hyperlipidemia) On Lipitor  Sciatica of right side On Lyrica and pain medication  Depression with anxiety On Cymbalta  Morbid obesity (HCC) BMI 50.27        Subjective: Patient not feeling well.  Has some soreness in his mouth after endoscopy.  Still having pains.  No abdominal pain after Foley catheter placed yesterday.  Physical Exam: Vitals:   10/15/22 1000 10/15/22 1100 10/15/22 1200 10/15/22 1207  BP:   (!) 136/59 (!) 118/58  Pulse: 92 92 92 88  Resp: 11 (!) 24 20 20   Temp:   98.4 F (36.9 C)   TempSrc:   Oral   SpO2: 99% 98% 95% 100%  Weight:      Height:  Physical Exam HENT:     Head: Normocephalic.     Mouth/Throat:     Pharynx: No oropharyngeal exudate.  Eyes:     General: Lids are normal.     Conjunctiva/sclera: Conjunctivae normal.  Cardiovascular:     Rate and Rhythm: Normal rate and regular rhythm.     Heart sounds: Normal heart sounds, S1 normal and S2 normal.  Pulmonary:     Breath sounds: Examination of the right-lower field reveals decreased breath sounds and rhonchi. Examination of the left-lower field reveals  decreased breath sounds and rhonchi. Decreased breath sounds and rhonchi present. No wheezing or rales.  Abdominal:     Palpations: Abdomen is soft.     Tenderness: There is no abdominal tenderness.  Musculoskeletal:     Left lower leg: Swelling present.     Right Lower Extremity: Right leg is amputated below knee.  Skin:    General: Skin is warm.     Comments: Wound to left lower extremity anteriorly.  Neurological:     Mental Status: He is alert and oriented to person, place, and time.     Data Reviewed: Chest x-ray peripheral vascular congestion. Creatinine 0.88, white blood cell count 10.9, hemoglobin 7.6, platelet count 182  Family Communication: Updated patient's wife on the phone  Disposition: Status is: Inpatient Remains inpatient appropriate because: Had maroon bowel movement today.  Giving another unit of blood today.  Planned Discharge Destination: Home    Time spent: 28 minutes  Author: Alford Highland, MD 10/15/2022 12:21 PM  For on call review [email or web address].

## 2022-10-15 NOTE — Progress Notes (Signed)
[facility] Inpatient Progress Note    Subjective: I was asked to see this patient again after signing off yesterday. It appears the patient has experienced some repeat hematochezia earlier today. Hgb stable but still low at 7.6. More blood ordered. Patient denies abdominal pain. He is recovering from Hyperosmolar hyperglycemic state and is now on scheduled Levemir insulin.No melena. Clear liquid diet currently.  Objective: Vital signs in last 24 hours: Temp:  [97.8 F (36.6 C)-99.5 F (37.5 C)] 99.5 F (37.5 C) (08/16 1222) Pulse Rate:  [84-106] 85 (08/16 1400) Resp:  [11-24] 12 (08/16 1400) BP: (108-158)/(54-79) 110/55 (08/16 1400) SpO2:  [87 %-100 %] 97 % (08/16 1400) Blood pressure (!) 110/55, pulse 85, temperature 99.5 F (37.5 C), temperature source Oral, resp. rate 12, height 5\' 11"  (1.803 m), weight (!) 163.5 kg, SpO2 97%.    Intake/Output from previous day: 08/15 0701 - 08/16 0700 In: 2846.7 [P.O.:540; I.V.:100; Blood:606.7; IV Piggyback:100] Out: 2525 [Urine:2525]  Intake/Output this shift: Total I/O In: 871.8 [P.O.:840; I.V.:31.8] Out: 1525 [Urine:1525]   Gen: NAD. Morbidly obese Appears mildly uncomfortable.  HEENT: [place]/AT. PERRLA. Normal external ear exam.  Chest: CTA, no wheezes.  CV: RR nl S1, S2. No gallops.  Abd: soft, obese, nt, nd. BS+  Ext: 2+ edema. Pulses 2+  Neuro: Alert and oriented. Judgement appears normal. Nonfocal.   Lab Results: Results for orders placed or performed during the hospital encounter of 10/12/22 (from the past 24 hour(s))  Glucose, capillary     Status: Abnormal   Collection Time: 10/14/22  3:40 PM  Result Value Ref Range   Glucose-Capillary 308 (H) 70 - 99 mg/dL  Prepare RBC (crossmatch)     Status: None   Collection Time: 10/14/22  5:20 PM  Result Value Ref Range   Order Confirmation      ORDER PROCESSED BY BLOOD BANK Performed at [facility], [address] [place], [place]  [address]   Glucose, capillary     Status: Abnormal   Collection Time: 10/14/22  9:09 PM  Result Value Ref Range   Glucose-Capillary 322 (H) 70 - 99 mg/dL  Glucose, capillary     Status: Abnormal   Collection Time: 10/15/22  1:29 AM  Result Value Ref Range   Glucose-Capillary 238 (H) 70 - 99 mg/dL  Glucose, capillary     Status: Abnormal   Collection Time: 10/15/22  5:24 AM  Result Value Ref Range   Glucose-Capillary 215 (H) 70 - 99 mg/dL  CBC     Status: Abnormal   Collection Time: 10/15/22  5:25 AM  Result Value Ref Range   WBC 10.9 (H) 4.0 - 10.5 K/uL   RBC 2.81 (L) 4.22 - 5.81 MIL/uL   Hemoglobin 7.6 (L) 13.0 - 17.0 g/dL   HCT 60.4 (L) 54.0 - 98.1 %   MCV 82.6 80.0 - 100.0 fL   MCH 27.0 26.0 - 34.0 pg   MCHC 32.8 30.0 - 36.0 g/dL   RDW 19.1 47.8 - 29.5 %   Platelets 182 150 - 400 K/uL   nRBC 0.2 0.0 - 0.2 %  Basic metabolic panel     Status: Abnormal   Collection Time: 10/15/22  5:25 AM  Result Value Ref Range   Sodium 136 135 - 145 mmol/L   Potassium 4.6 3.5 - 5.1 mmol/L   Chloride 102 98 - 111 mmol/L   CO2 28 22 - 32 mmol/L   Glucose, Bld 206 (H) 70 - 99 mg/dL   BUN  15 6 - 20 mg/dL   Creatinine, Ser 0.86 0.61 - 1.24 mg/dL   Calcium 8.3 (L) 8.9 - 10.3 mg/dL   GFR, Estimated >57 >84 mL/min   Anion gap 6 5 - 15  Glucose, capillary     Status: Abnormal   Collection Time: 10/15/22  8:01 AM  Result Value Ref Range   Glucose-Capillary 175 (H) 70 - 99 mg/dL  Prepare RBC (crossmatch)     Status: None   Collection Time: 10/15/22  9:30 AM  Result Value Ref Range   Order Confirmation      ORDER PROCESSED BY BLOOD BANK Performed at [facility], [address] [place], [place] [address]   Glucose, capillary     Status: Abnormal   Collection Time: 10/15/22 11:44 AM  Result Value Ref Range   Glucose-Capillary 249 (H) 70 - 99 mg/dL     Recent Labs    52/84/13 0130 10/14/22 0420 10/14/22 1448 10/15/22 0525  WBC 9.3 8.4  --  10.9*  HGB 8.6* 8.2* 7.5* 7.6*   HCT 25.8* 24.6*  --  23.2*  PLT 195 194  --  182   BMET Recent Labs    10/14/22 0130 10/14/22 0420 10/15/22 0525  NA 132* 135 136  K 4.7 4.3 4.6  CL 101 104 102  CO2 23 28 28   GLUCOSE 149* 146* 206*  BUN 32* 29* 15  CREATININE 1.12 1.06 0.88  CALCIUM 8.0* 8.0* 8.3*   LFT Recent Labs    10/13/22 0436  PROT 5.3*  ALBUMIN 2.3*  AST 16  ALT 12  ALKPHOS 70  BILITOT 0.1*   PT/INR Recent Labs    10/13/22 0229  LABPROT 15.8*  INR 1.2   Hepatitis Panel No results for input(s): "HEPBSAG", "HCVAB", "HEPAIGM", "HEPBIGM" in the last 72 hours. C-Diff No results for input(s): "CDIFFTOX" in the last 72 hours. No results for input(s): "CDIFFPCR" in the last 72 hours.   Studies/Results: DG Chest Port 1 View  Result Date: 10/15/2022 CLINICAL DATA:  Hypoxia. EXAM: PORTABLE CHEST 1 VIEW COMPARISON:  One-view chest x-ray 10/12/2022 FINDINGS: The heart is mildly enlarged, exaggerated by low lung volumes. Moderate pulmonary vascular congestion is present. Mild left basilar airspace opacities are noted. The lungs are otherwise clear. IMPRESSION: 1. Cardiomegaly and moderate pulmonary vascular congestion. 2. Mild left basilar airspace disease likely reflects atelectasis. Infection is not excluded. Electronically Signed   By: Marin Roberts M.D.   On: 10/15/2022 11:30   NM GI Blood Loss  Result Date: 10/13/2022 CLINICAL DATA:  Upper GI bleed. EXAM: NUCLEAR MEDICINE GASTROINTESTINAL BLEEDING SCAN TECHNIQUE: Sequential abdominal images were obtained following intravenous administration of Tc-67m labeled red blood cells. RADIOPHARMACEUTICALS:  19.75 mCi Tc-88m pertechnetate in-vitro labeled red cells. COMPARISON:  CT abdomen pelvis dated 10/13/2022. FINDINGS: Radiotracer noted in the cardiac blood pool and aortoiliac vessels. Physiologic uptake in the spleen and liver. No activity noted conforming to the bowel to suggest active GI bleed. IMPRESSION: No scintigraphic evidence of active GI  bleed. Electronically Signed   By: Elgie Collard M.D.   On: 10/13/2022 19:51    Scheduled Inpatient Medications:    sodium chloride   Intravenous Once   atorvastatin  40 mg Oral Daily   [START ON 10/16/2022] cephALEXin  500 mg Oral Q6H   Chlorhexidine Gluconate Cloth  6 each Topical Daily   DULoxetine  30 mg Oral TID   furosemide  40 mg Intravenous Once   insulin aspart  0-20 Units Subcutaneous Q4H  insulin detemir  45 Units Subcutaneous BID   pantoprazole (PROTONIX) IV  40 mg Intravenous Q12H   pregabalin  150 mg Oral TID   tamsulosin  0.4 mg Oral QPC breakfast    Continuous Inpatient Infusions:    sodium chloride 250 mL (10/14/22 1102)    PRN Inpatient Medications:  acetaminophen, dextrose, HYDROmorphone (DILAUDID) injection, menthol-cetylpyridinium, ondansetron (ZOFRAN) IV, mouth rinse, oxyCODONE-acetaminophen, phenol, senna-docusate  Miscellaneous: N/A  Assessment:  Melena - resolved currently.  Multiple gastric ulcers, non-bleeding on EGD 10/14/22. H pylori testing pending. Hx hemorrhoids. Hematochezia, recurrent 5.   Hypovolemic shock, resolving off pressors. 6.   Fluid overload - s/p diuresis 7.   Hyperosmolar hyperglycemic state - improving. 8    Post-hemorrhagic anemia - 7.6 Hgb. Baseline Hgb 13.07 June 2022. 9.   Morbid obesity. 10. Lower extremity wound - on Iv antibiotics  Plan:   Continue serial H/H. Plan colonoscopy tomorrow AM. The patient understands the nature of the planned procedure, indications, risks, alternatives and potential complications including but not limited to bleeding, infection, perforation, damage to internal organs and possible oversedation/side effects from anesthesia. The patient agrees and gives consent to proceed.  Please refer to procedure notes for findings, recommendations and patient disposition/instructions. Dr. [name], on-call gastroenterologist, will be asked to perform procedure tomorrow. Further recommendations to  follow.  Wilbern Pennypacker K. Norma Fredrickson, M.D. 10/15/2022, 3:02 PM

## 2022-10-15 NOTE — Plan of Care (Signed)
Continuing with plan of care. 

## 2022-10-15 NOTE — Progress Notes (Signed)
Patient had a maroon colored bowel movement with foul odor.  Dr. Renae Gloss notified and ordered a unit of blood, lasix, and tylenol.

## 2022-10-15 NOTE — Progress Notes (Signed)
PT Cancellation Note  Patient Details Name: Russell [name] MRN: [id number] DOB: Jun 04, 1965   Cancelled Treatment:    Reason Eval/Treat Not Completed: Medical issues which prohibited therapy (Discussed with nurse who is requesting to hold PT. Patient just had maroon colored bowel movement and is pending additional blood transfusion. PT to follow up as appropriate.)  Donna Bernard, PT, MPT  Ina Homes 10/15/2022, 9:37 AM

## 2022-10-15 NOTE — Assessment & Plan Note (Signed)
Likely secondary to fluid overload (IV fluid and blood transfusions).  Patient on 3 L nasal cannula.  Check a pulse ox on room air.  Likely has underlying sleep apnea.  Will need an overnight oximetry to qualify for nocturnal oxygen but would like to get off oxygen during the day first.

## 2022-10-16 ENCOUNTER — Encounter: Payer: Self-pay | Admitting: Internal Medicine

## 2022-10-16 ENCOUNTER — Encounter
Admission: EM | Disposition: A | Discharge status: Home Health | Payer: Self-pay | Source: Home / Self Care | Attending: Internal Medicine

## 2022-10-16 ENCOUNTER — Inpatient Hospital Stay: Payer: 59 | Admitting: Anesthesiology

## 2022-10-16 DIAGNOSIS — D62 Acute posthemorrhagic anemia: Secondary | ICD-10-CM | POA: Diagnosis not present

## 2022-10-16 DIAGNOSIS — K921 Melena: Secondary | ICD-10-CM | POA: Diagnosis not present

## 2022-10-16 DIAGNOSIS — E1100 Type 2 diabetes mellitus with hyperosmolarity without nonketotic hyperglycemic-hyperosmolar coma (NKHHC): Secondary | ICD-10-CM | POA: Diagnosis not present

## 2022-10-16 DIAGNOSIS — K640 First degree hemorrhoids: Secondary | ICD-10-CM

## 2022-10-16 DIAGNOSIS — I5033 Acute on chronic diastolic (congestive) heart failure: Secondary | ICD-10-CM | POA: Diagnosis not present

## 2022-10-16 DIAGNOSIS — R571 Hypovolemic shock: Secondary | ICD-10-CM | POA: Diagnosis not present

## 2022-10-16 HISTORY — PX: COLONOSCOPY: SHX5424

## 2022-10-16 LAB — BASIC METABOLIC PANEL
Anion gap: 5 (ref 5–15)
BUN: 10 mg/dL (ref 6–20)
CO2: 30 mmol/L (ref 22–32)
Calcium: 8.2 mg/dL — ABNORMAL LOW (ref 8.9–10.3)
Chloride: 103 mmol/L (ref 98–111)
Creatinine, Ser: 0.8 mg/dL (ref 0.61–1.24)
GFR, Estimated: >60 mL/min (ref >60)
Glucose, Bld: 105 mg/dL — ABNORMAL HIGH (ref 70–99)
Potassium: 3.7 mmol/L (ref 3.5–5.1)
Sodium: 138 mmol/L (ref 135–145)

## 2022-10-16 LAB — HEMOGLOBIN AND HEMATOCRIT, BLOOD
HCT: 25.1 % — ABNORMAL LOW (ref 39.0–52.0)
Hemoglobin: 8.1 g/dL — ABNORMAL LOW (ref 13.0–17.0)

## 2022-10-16 LAB — CBC
HCT: 25 % — ABNORMAL LOW (ref 39.0–52.0)
Hemoglobin: 7.9 g/dL — ABNORMAL LOW (ref 13.0–17.0)
MCH: 27.1 pg (ref 26.0–34.0)
MCHC: 31.6 g/dL (ref 30.0–36.0)
MCV: 85.6 fL (ref 80.0–100.0)
Platelets: 182 10*3/uL (ref 150–400)
RBC: 2.92 MIL/uL — ABNORMAL LOW (ref 4.22–5.81)
RDW: 14.7 % (ref 11.5–15.5)
WBC: 7.4 10*3/uL (ref 4.0–10.5)
nRBC: 0.5 % — ABNORMAL HIGH (ref 0.0–0.2)

## 2022-10-16 LAB — GLUCOSE, CAPILLARY
Glucose-Capillary: 107 mg/dL — ABNORMAL HIGH (ref 70–99)
Glucose-Capillary: 112 mg/dL — ABNORMAL HIGH (ref 70–99)
Glucose-Capillary: 169 mg/dL — ABNORMAL HIGH (ref 70–99)
Glucose-Capillary: 175 mg/dL — ABNORMAL HIGH (ref 70–99)
Glucose-Capillary: 224 mg/dL — ABNORMAL HIGH (ref 70–99)
Glucose-Capillary: 89 mg/dL (ref 70–99)

## 2022-10-16 LAB — TYPE AND SCREEN
ABO/RH(D): O NEG
Antibody Screen: NEGATIVE
Unit division: 0
Unit division: 0
Unit division: 0
Unit division: 0

## 2022-10-16 LAB — BPAM RBC
Blood Product Expiration Date: 202409082359
Blood Product Expiration Date: 202409082359
Blood Product Expiration Date: 202409082359
Blood Product Expiration Date: 202409082359
ISSUE DATE / TIME: 202408140542
ISSUE DATE / TIME: 202408142204
ISSUE DATE / TIME: 202408151750
ISSUE DATE / TIME: 202408161201
Unit Type and Rh: 5100
Unit Type and Rh: 5100
Unit Type and Rh: 5100
Unit Type and Rh: 5100

## 2022-10-16 SURGERY — COLONOSCOPY
Anesthesia: General

## 2022-10-16 MED ORDER — KETAMINE HCL 10 MG/ML IJ SOLN
INTRAMUSCULAR | Status: DC | PRN
  Administered 2022-10-16: 20 mg via INTRAVENOUS

## 2022-10-16 MED ORDER — LIDOCAINE HCL (CARDIAC) PF 100 MG/5ML IV SOSY
PREFILLED_SYRINGE | INTRAVENOUS | Status: DC | PRN
  Administered 2022-10-16: 100 mg via INTRAVENOUS

## 2022-10-16 MED ORDER — PROPOFOL 10 MG/ML IV BOLUS
INTRAVENOUS | Status: DC | PRN
Start: 2022-10-16 — End: 2022-10-16
  Administered 2022-10-16: 100 mg via INTRAVENOUS

## 2022-10-16 MED ORDER — FUROSEMIDE 40 MG PO TABS
40.0000 mg | ORAL_TABLET | Freq: Every day | ORAL | Status: DC
  Administered 2022-10-16 – 2022-10-19 (×4): 40 mg via ORAL
  Filled 2022-10-16 (×4): qty 1

## 2022-10-16 MED ORDER — KETOROLAC TROMETHAMINE 30 MG/ML IJ SOLN
INTRAMUSCULAR | Status: AC
  Filled 2022-10-16: qty 1

## 2022-10-16 MED ORDER — PROPOFOL 1000 MG/100ML IV EMUL
INTRAVENOUS | Status: AC
  Filled 2022-10-16: qty 100

## 2022-10-16 MED ORDER — INSULIN DETEMIR 100 UNIT/ML ~~LOC~~ SOLN
30.0000 [IU] | Freq: Two times a day (BID) | SUBCUTANEOUS | Status: DC
  Administered 2022-10-16 – 2022-10-17 (×3): 30 [IU] via SUBCUTANEOUS
  Filled 2022-10-16 (×4): qty 0.3

## 2022-10-16 MED ORDER — OXYMETAZOLINE HCL 0.05 % NA SOLN
1.0000 | Freq: Two times a day (BID) | NASAL | Status: AC
  Administered 2022-10-16 – 2022-10-18 (×6): 1 via NASAL
  Filled 2022-10-16: qty 15

## 2022-10-16 MED ORDER — KETAMINE HCL 50 MG/5ML IJ SOSY
PREFILLED_SYRINGE | INTRAMUSCULAR | Status: AC
  Filled 2022-10-16: qty 5

## 2022-10-16 MED ORDER — GLYCOPYRROLATE 0.2 MG/ML IJ SOLN
INTRAMUSCULAR | Status: DC | PRN
  Administered 2022-10-16: .2 mg via INTRAVENOUS

## 2022-10-16 MED ORDER — SODIUM CHLORIDE 0.9 % IV SOLN: INTRAVENOUS | Status: DC

## 2022-10-16 MED ORDER — PANTOPRAZOLE SODIUM 40 MG PO TBEC
40.0000 mg | DELAYED_RELEASE_TABLET | Freq: Two times a day (BID) | ORAL | Status: DC
  Administered 2022-10-16 – 2022-10-19 (×6): 40 mg via ORAL
  Filled 2022-10-16 (×6): qty 1
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
## 2022-10-16 NOTE — Progress Notes (Signed)
Attempted to call report on patient, receiving nurse to return call to this nurse for report.

## 2022-10-16 NOTE — Op Note (Signed)
[facility]astroenter[facility] Patient Name: Russell [name] Procedure Date: 10/16/2022 9:49 AM MRN: [id number] Account #: [id number] Date of Birth: May 10, 1965 Admit Type: Inpatient Age: 57 Room: [facility] ENDO ROOM 4 Gender: Male Note Status: Finalized Instrument Name: Prentice Docker [id number] Procedure:             Colonoscopy Indications:           Hematochezia Providers:             [name] MD, MD Referring MD:          Nat Christen. Zada Finders, MD (Referring MD) Medicines:             Monitored Anesthesia Care Complications:         No immediate complications. Procedure:             Pre-Anesthesia Assessment:                        - Prior to the procedure, a History and Physical was                         performed, and patient medications, allergies and                         sensitivities were reviewed. The patient's tolerance                         of previous anesthesia was reviewed.                        - The risks and benefits of the procedure and the                         sedation options and risks were discussed with the                         patient. All questions were answered and informed                         consent was obtained.                        - ASA Grade Assessment: III - A patient with severe                         systemic disease.                        After obtaining informed consent, the colonoscope was                         passed under direct vision. Throughout the procedure,                         the patient's blood pressure, pulse, and oxygen                         saturations were monitored continuously. The                         Colonoscope was introduced through the anus  and                         advanced to the the cecum, identified by the                         appendiceal orifice. The colonoscopy was performed                         with ease. The patient tolerated the procedure well.                         The  quality of the bowel preparation was                         unsatisfactory. Findings:      The perianal and digital rectal examinations were normal.      Non-bleeding internal hemorrhoids were found during retroflexion. The       hemorrhoids were small and Grade I (internal hemorrhoids that do not       prolapse).      a large amount of semi-liquid brown stool was found in the entire colon       . no blood or melena seen [Visualization]. Impression:            - Preparation of the colon was unsatisfactory.                        - Non-bleeding internal hemorrhoids.                        - Stool in the entire examined colon.                        - No specimens collected. Recommendation:        - Return patient to hospital ward for ongoing care.                        - Full liquid diet. Procedure Code(s):     --- Professional ---                        [id number], Colonoscopy, flexible; diagnostic, including                         collection of specimen(s) by brushing or washing, when                         performed (separate procedure) Diagnosis Code(s):     --- Professional ---                        K64.0, First degree hemorrhoids                        K92.1, Melena (includes Hematochezia) CPT copyright 2022 American Medical Association. All rights reserved. The codes documented in this report are preliminary and upon coder review may  be revised to meet current compliance requirements. [name], MD [name] MD, MD 10/16/2022 11:09:26 AM This report has been signed electronically. Number of Addenda: 0 Note Initiated On: 10/16/2022 9:49 AM Scope Withdrawal Time: 0  hours 3 minutes 35 seconds  Total Procedure Duration: 0 hours 5 minutes 36 seconds  Estimated Blood Loss:  Estimated blood loss: none.      [facility]

## 2022-10-16 NOTE — Progress Notes (Signed)
Pt received in room 213. Alert and oriented and denies pain. 2 male and including 1 male family members at bedside. Skin assessment completed. Areas of abrasions noted to left toes, heels, posterior/lateral aspect of left lower extremity. [place] dsgs intact to abrasions on left lower extremity. LLE warm to touch with overly pinkish skin tone. Some flakiness also noted to LLE, pronounced on foot/heel. LFA PIV intact. Moderate cut noted to right pointer and index fingers.  Pt reports hitting his fingers some time ago. Pt made comfortable, notified of change in diet and menu given. Call bell within reach. Will continue to care for pt.

## 2022-10-16 NOTE — Progress Notes (Signed)
Progress Note   Patient: Russell [name] GNF:[id number] DOB: 08-12-1965 DOA: 10/12/2022     3 DOS: the patient was seen and examined on 10/16/2022   Brief hospital course: 57 y.o. male with medical history significant of hypertension, hyperlipidemia, diabetes mellitus,dCHF, depression with anxiety, A-flutter not on anticoagulants, morbid obesity, right BKA, chronic venous insufficiency in leg, who presents with abdominal pain, rectal bleeding, left lower leg wounds,   Patient states he has abdominal pain for more than 2 days, which is located in the upper abdomen, constant, aching, 7 out of 10 in severity, nonradiating.  It is not aggravated or alleviated by any known factors.  Associated with nausea, no vomiting.  8/14.  Patient received a unit of blood in the early morning.  Had a black stool when I saw him.  Patient had epigastric pain coming in.  Notified gastroenterology team.  Patient on low-dose Levophed this morning. 8/15.  EGD showing gastritis and nonbleeding gastric ulcers.  Patient converted off insulin drip this morning.  Afternoon hemoglobin down to 7.5 and given a unit of blood. 8/16.  Notified that patient had a maroon-colored stool.  A.m. hemoglobin 7.6.  Will give another unit of blood.  Lasix before and after blood transfusion. 8/17.  Hemoglobin 7.9.  Colonoscopy did not show any active blood in the colon.   Assessment and Plan: * Hypovolemic shock (HCC) Resolved.  Patient off Levophed.  Patient required 4 units of packed red blood cells during hospital course.  Upper GI bleed EGD on 8/15 showed nonbleeding gastric ulcers and gastritis.  Colonoscopy on 8/17 did not show any active bleeding.  Continue to watch hemoglobin.  May end up needing a capsule study if further bleeding.  Acute blood loss anemia Transfused 4 units of packed red blood cells during the hospital course and IV iron.  Last hemoglobin 7.9.  Continue to watch hemoglobin.  Hyperosmolar hyperglycemic state  (HHS) (HCC) Patient converted off insulin drip 8/15.  Currently on Levemir insulin 30 units twice days plus sliding scale.  Patient on full liquid diet for lunch and advance to soft food for dinner.  Acute on chronic diastolic CHF (congestive heart failure) (HCC) Will give IV Lasix daily.  Hyperkalemia Treated earlier in hospital course  Acute respiratory failure with hypoxia (HCC) Likely secondary to fluid overload (IV fluid and blood transfusions).  Patient on 3 L nasal cannula.  Check a pulse ox on room air.  Likely has underlying sleep apnea.  Will need an overnight oximetry to qualify for nocturnal oxygen but would like to get off oxygen during the day first.  Elevated lactic acid level Normalized.  Discontinued  Epigastric abdominal pain Likely with gastric ulcers and gastritis.  AKI (acute kidney injury) (HCC) Creatinine up to 1.6 during hospital course and down to 0.8.  Wound infection_ left lower leg On IV Rocephin  HLD (hyperlipidemia) On Lipitor  Sciatica of right side On Lyrica and pain medication  Depression with anxiety On Cymbalta  Morbid obesity (HCC) BMI 50.27        Subjective: Patient seen this morning before colonoscopy.  Had a rough night with numerous bowel movements.  GI team did not see any active bleeding.  Physical Exam: Vitals:   10/16/22 0947 10/16/22 1047 10/16/22 1116 10/16/22 1126  BP: (!) 152/56 (!) 166/68 (!) 128/94 (!) 144/57  Pulse: 84 80    Resp: 15 13  20   Temp: 98.4 F (36.9 C)     TempSrc: Temporal  SpO2: 97% 98%  99%  Weight:      Height:       Physical Exam HENT:     Head: Normocephalic.     Mouth/Throat:     Pharynx: No oropharyngeal exudate.  Eyes:     General: Lids are normal.     Conjunctiva/sclera: Conjunctivae normal.  Cardiovascular:     Rate and Rhythm: Normal rate and regular rhythm.     Heart sounds: Normal heart sounds, S1 normal and S2 normal.  Pulmonary:     Breath sounds: Examination of the  right-lower field reveals decreased breath sounds and rhonchi. Examination of the left-lower field reveals decreased breath sounds and rhonchi. Decreased breath sounds and rhonchi present. No wheezing or rales.  Abdominal:     Palpations: Abdomen is soft.     Tenderness: There is no abdominal tenderness.  Musculoskeletal:     Left lower leg: Swelling present.     Right Lower Extremity: Right leg is amputated below knee.  Skin:    General: Skin is warm.     Comments: Wound to left lower extremity anteriorly.  Neurological:     Mental Status: He is alert and oriented to person, place, and time.     Data Reviewed: Hemoglobin 7.9, creatinine 0.8 Colonoscopy showed stool in the entire colon.  Nonbleeding internal hemorrhoids.  Family Communication: Spoke with wife on the phone  Disposition: Status is: Inpatient Remains inpatient appropriate because: Will try to advance diet and see if he bleeds further.  May end up needing a capsule study if further bleeding.  Planned Discharge Destination: Home    Time spent: 28 minutes  Author: Alford Highland, MD 10/16/2022 12:26 PM  For on call review [email or web address].

## 2022-10-16 NOTE — Transfer of Care (Signed)
Immediate Anesthesia Transfer of Care Note  Patient: Russell [name]  Procedure(s) Performed: COLONOSCOPY  Patient Location: PACU and Endoscopy Unit  Anesthesia Type:General  Level of Consciousness: awake, alert , drowsy, and patient cooperative  Airway & Oxygen Therapy: Patient Spontanous Breathing and Patient connected to nasal cannula oxygen  Post-op Assessment: Report given to RN, Post -op Vital signs reviewed and stable, and Patient moving all extremities  Post vital signs: Reviewed and stable  Last Vitals:  Vitals Value Taken Time  BP 178/85 10/16/22 1154  Temp    Pulse 93 10/16/22 1158  Resp 19 10/16/22 1158  SpO2 97 % 10/16/22 1158  Vitals shown include unfiled device data.  Last Pain:  Vitals:   10/16/22 1116  TempSrc:   PainSc: 1       Patients Stated Pain Goal: 0 (10/15/22 1600)  Complications: No notable events documented.

## 2022-10-16 NOTE — Progress Notes (Signed)
PT Cancellation Note  Patient Details Name: Russell [name] MRN: [id number] DOB: [birth year]   Cancelled Treatment:    Reason Eval/Treat Not Completed: Patient at procedure or test/unavailable PT orders received, chart reviewed. Pt noted to be off the floor; per chart, pt scheduled for colonoscopy this AM. Will f/u as able & as pt is available.  Aleda Grana, PT, DPT 10/16/22, 9:55 AM   [name] 10/16/2022, 9:55 AM

## 2022-10-16 NOTE — Progress Notes (Signed)
Report received from nurse in procedural area, patient transported back in hospital bed with cardiac monitoring.  Patient is alert and oriented x 4, stable condition, will continue to monitor.

## 2022-10-16 NOTE — Progress Notes (Addendum)
Patient taken for colonoscopy in hospital bed with cardiac monitoring; this nurse present with transportation from ICU to colonoscopy procedural area.

## 2022-10-16 NOTE — Anesthesia Postprocedure Evaluation (Signed)
Anesthesia Post Note  Patient: Russell [name]  Procedure(s) Performed: COLONOSCOPY  Patient location during evaluation: Endoscopy Anesthesia Type: General Level of consciousness: awake and alert Pain management: pain level controlled Vital Signs Assessment: post-procedure vital signs reviewed and stable Respiratory status: spontaneous breathing, nonlabored ventilation and respiratory function stable Cardiovascular status: blood pressure returned to baseline and stable Postop Assessment: no apparent nausea or vomiting Anesthetic complications: no   No notable events documented.   Last Vitals:  Vitals:   10/16/22 1116 10/16/22 1126  BP: (!) 128/94 (!) 144/57  Pulse:    Resp:  20  Temp:    SpO2:  99%    Last Pain:  Vitals:   10/16/22 1116  TempSrc:   PainSc: 1                  Foye Deer

## 2022-10-16 NOTE — Plan of Care (Signed)
Continuing with plan of care. 

## 2022-10-16 NOTE — Progress Notes (Signed)
Report given to charge nurse, Raoul Pitch, RN; patient transferred to room 213 in hospital bed with all belongings and in stable condition.

## 2022-10-16 NOTE — Plan of Care (Signed)

## 2022-10-16 NOTE — Progress Notes (Signed)
Patient continues off unit for colonoscopy.

## 2022-10-16 NOTE — Anesthesia Preprocedure Evaluation (Addendum)
Anesthesia Evaluation  Patient identified by MRN, date of birth, ID band Patient awake    Reviewed: Allergy & Precautions, NPO status , Patient's Chart, lab work & pertinent test results  History of Anesthesia Complications Negative for: history of anesthetic complications  Airway Mallampati: III  TM Distance: >3 FB Neck ROM: full    Dental  (+) Poor Dentition,    Pulmonary shortness of breath Acute Respiratory Failure 3L [place] Possible fluid overload CXR: IMPRESSION: 1. Cardiomegaly and moderate pulmonary vascular congestion. 2. Mild left basilar airspace disease likely reflects atelectasis. Infection is not excluded.   Springer in place with slightly blood tinged nares   + decreased breath sounds      Cardiovascular hypertension, On Medications +CHF (dCHF)  + dysrhythmias Atrial Fibrillation  Rhythm:Regular Rate:Normal + Peripheral Edema    Neuro/Psych  PSYCHIATRIC DISORDERS Anxiety Depression     Neuromuscular disease (R sciatica)    GI/Hepatic negative GI ROS, Neg liver ROS,,,Lower GIB- Melena - resolved currently   Endo/Other  diabetes, Type 2  Morbid obesity  Renal/GU Renal disease  negative genitourinary   Musculoskeletal   Abdominal  (+) + obese  Peds  Hematology  (+) Blood dyscrasia, anemia   Anesthesia Other Findings Pt in ICU  recovering from Hyperosmolar hyperglycemic state, Hypovolemic shock, pRBC administration with diuresis.   right BKA  Past Medical History: No date: Anxiety No date: CHF (congestive heart failure) (HCC)     Comment:  EF 30-35% No date: Chronic diastolic (congestive) heart failure (HCC) No date: Depression No date: Diabetes mellitus without complication (HCC) No date: Hyperlipemia No date: Hypertension No date: Morbid obesity (HCC) No date: Peripheral edema  Past Surgical History: No date: APPENDECTOMY No date: BELOW KNEE LEG AMPUTATION     Comment:  Right leg No date:  TONSILLECTOMY  BMI    Body Mass Index: 50.27 kg/m      Reproductive/Obstetrics negative OB ROS                             Anesthesia Physical Anesthesia Plan  ASA: 4  Anesthesia Plan: General   Post-op Pain Management: Minimal or no pain anticipated   Induction: Intravenous  PONV Risk Score and Plan: 1 and TIVA  Airway Management Planned: Natural Airway and Simple Face Mask  Additional Equipment:   Intra-op Plan:   Post-operative Plan:   Informed Consent: I have reviewed the patients History and Physical, chart, labs and discussed the procedure including the risks, benefits and alternatives for the proposed anesthesia with the patient or authorized representative who has indicated his/her understanding and acceptance.     Dental advisory given  Plan Discussed with: Anesthesiologist and CRNA  Anesthesia Plan Comments:        Anesthesia Quick Evaluation

## 2022-10-16 NOTE — Addendum Note (Signed)
Addendum  created 10/16/22 1505 by Katherine Basset, CRNA   Intraprocedure Event edited

## 2022-10-17 DIAGNOSIS — R338 Other retention of urine: Secondary | ICD-10-CM | POA: Insufficient documentation

## 2022-10-17 DIAGNOSIS — K259 Gastric ulcer, unspecified as acute or chronic, without hemorrhage or perforation: Secondary | ICD-10-CM | POA: Diagnosis not present

## 2022-10-17 DIAGNOSIS — R571 Hypovolemic shock: Secondary | ICD-10-CM | POA: Diagnosis not present

## 2022-10-17 DIAGNOSIS — D62 Acute posthemorrhagic anemia: Secondary | ICD-10-CM | POA: Diagnosis not present

## 2022-10-17 DIAGNOSIS — E1100 Type 2 diabetes mellitus with hyperosmolarity without nonketotic hyperglycemic-hyperosmolar coma (NKHHC): Secondary | ICD-10-CM | POA: Diagnosis not present

## 2022-10-17 LAB — GLUCOSE, CAPILLARY
Glucose-Capillary: 159 mg/dL — ABNORMAL HIGH (ref 70–99)
Glucose-Capillary: 171 mg/dL — ABNORMAL HIGH (ref 70–99)
Glucose-Capillary: 108 mg/dL — ABNORMAL HIGH (ref 70–99)
Glucose-Capillary: 216 mg/dL — ABNORMAL HIGH (ref 70–99)
Glucose-Capillary: 206 mg/dL — ABNORMAL HIGH (ref 70–99)
Glucose-Capillary: 226 mg/dL — ABNORMAL HIGH (ref 70–99)

## 2022-10-17 LAB — BASIC METABOLIC PANEL
Anion gap: 8 (ref 5–15)
BUN: 7 mg/dL (ref 6–20)
CO2: 29 mmol/L (ref 22–32)
Calcium: 8.3 mg/dL — ABNORMAL LOW (ref 8.9–10.3)
Chloride: 99 mmol/L (ref 98–111)
Creatinine, Ser: 0.7 mg/dL (ref 0.61–1.24)
GFR, Estimated: >60 mL/min (ref >60)
Glucose, Bld: 118 mg/dL — ABNORMAL HIGH (ref 70–99)
Potassium: 3.7 mmol/L (ref 3.5–5.1)
Sodium: 136 mmol/L (ref 135–145)

## 2022-10-17 LAB — CBC
HCT: 25.5 % — ABNORMAL LOW (ref 39.0–52.0)
Hemoglobin: 8.3 g/dL — ABNORMAL LOW (ref 13.0–17.0)
MCH: 26.9 pg (ref 26.0–34.0)
MCHC: 32.5 g/dL (ref 30.0–36.0)
MCV: 82.8 fL (ref 80.0–100.0)
Platelets: 223 10*3/uL (ref 150–400)
RBC: 3.08 MIL/uL — ABNORMAL LOW (ref 4.22–5.81)
RDW: 14.9 % (ref 11.5–15.5)
WBC: 8.2 10*3/uL (ref 4.0–10.5)
nRBC: 0.4 % — ABNORMAL HIGH (ref 0.0–0.2)

## 2022-10-17 MED ORDER — INSULIN DETEMIR 100 UNIT/ML ~~LOC~~ SOLN
35.0000 [IU] | Freq: Two times a day (BID) | SUBCUTANEOUS | Status: DC
  Administered 2022-10-17: 35 [IU] via SUBCUTANEOUS
  Filled 2022-10-17 (×2): qty 0.35

## 2022-10-17 MED ORDER — INSULIN ASPART 100 UNIT/ML IJ SOLN
0.0000 [IU] | Freq: Three times a day (TID) | INTRAMUSCULAR | Status: DC
  Administered 2022-10-17 (×2): 5 [IU] via SUBCUTANEOUS
  Administered 2022-10-18: 8 [IU] via SUBCUTANEOUS
  Administered 2022-10-18 (×2): 5 [IU] via SUBCUTANEOUS
  Administered 2022-10-19: 11 [IU] via SUBCUTANEOUS
  Filled 2022-10-17 (×6): qty 1

## 2022-10-17 MED ORDER — LORATADINE 10 MG PO TABS
10.0000 mg | ORAL_TABLET | Freq: Every day | ORAL | Status: DC
  Administered 2022-10-17 – 2022-10-19 (×3): 10 mg via ORAL
  Filled 2022-10-17 (×3): qty 1

## 2022-10-17 MED ORDER — INSULIN ASPART 100 UNIT/ML IJ SOLN: 0.0000 [IU] | Freq: Three times a day (TID) | INTRAMUSCULAR | Status: DC

## 2022-10-17 MED ORDER — INSULIN ASPART 100 UNIT/ML IJ SOLN
0.0000 [IU] | Freq: Every day | INTRAMUSCULAR | Status: DC
  Administered 2022-10-17: 2 [IU] via SUBCUTANEOUS
  Administered 2022-10-18: 5 [IU] via SUBCUTANEOUS
  Filled 2022-10-17 (×2): qty 1

## 2022-10-17 NOTE — Plan of Care (Signed)
  Problem: Education: Goal: Ability to describe self-care measures that may prevent or decrease complications (Diabetes Survival Skills Education) will improve Outcome: Progressing   Problem: Coping: Goal: Ability to adjust to condition or change in health will improve Outcome: Progressing   Problem: Fluid Volume: Goal: Ability to maintain a balanced intake and output will improve Outcome: Progressing   Problem: Health Behavior/Discharge Planning: Goal: Ability to identify and utilize available resources and services will improve Outcome: Progressing Goal: Ability to manage health-related needs will improve Outcome: Progressing   Problem: Metabolic: Goal: Ability to maintain appropriate glucose levels will improve Outcome: Progressing   Problem: Nutritional: Goal: Maintenance of adequate nutrition will improve Outcome: Progressing Goal: Progress toward achieving an optimal weight will improve Outcome: Progressing   Problem: Skin Integrity: Goal: Risk for impaired skin integrity will decrease Outcome: Progressing   Problem: Tissue Perfusion: Goal: Adequacy of tissue perfusion will improve Outcome: Progressing   Problem: Education: Goal: Ability to describe self-care measures that may prevent or decrease complications (Diabetes Survival Skills Education) will improve Outcome: Progressing   Problem: Cardiac: Goal: Ability to maintain an adequate cardiac output will improve Outcome: Progressing   Problem: Health Behavior/Discharge Planning: Goal: Ability to identify and utilize available resources and services will improve Outcome: Progressing Goal: Ability to manage health-related needs will improve Outcome: Progressing   Problem: Fluid Volume: Goal: Ability to achieve a balanced intake and output will improve Outcome: Progressing   Problem: Metabolic: Goal: Ability to maintain appropriate glucose levels will improve Outcome: Progressing   Problem:  Nutritional: Goal: Maintenance of adequate nutrition will improve Outcome: Progressing Goal: Maintenance of adequate weight for body size and type will improve Outcome: Progressing   Problem: Respiratory: Goal: Will regain and/or maintain adequate ventilation Outcome: Progressing   Problem: Urinary Elimination: Goal: Ability to achieve and maintain adequate renal perfusion and functioning will improve Outcome: Progressing   Problem: Education: Goal: Knowledge of General Education information will improve Description: Including pain rating scale, medication(s)/side effects and non-pharmacologic comfort measures Outcome: Progressing   Problem: Health Behavior/Discharge Planning: Goal: Ability to manage health-related needs will improve Outcome: Progressing   Problem: Clinical Measurements: Goal: Ability to maintain clinical measurements within normal limits will improve Outcome: Progressing Goal: Will remain free from infection Outcome: Progressing Goal: Diagnostic test results will improve Outcome: Progressing Goal: Respiratory complications will improve Outcome: Progressing Goal: Cardiovascular complication will be avoided Outcome: Progressing   Problem: Activity: Goal: Risk for activity intolerance will decrease Outcome: Progressing   Problem: Nutrition: Goal: Adequate nutrition will be maintained Outcome: Progressing   Problem: Coping: Goal: Level of anxiety will decrease Outcome: Progressing   Problem: Elimination: Goal: Will not experience complications related to bowel motility Outcome: Progressing Goal: Will not experience complications related to urinary retention Outcome: Progressing   Problem: Pain Managment: Goal: General experience of comfort will improve Outcome: Progressing   Problem: Safety: Goal: Ability to remain free from injury will improve Outcome: Progressing   Problem: Skin Integrity: Goal: Risk for impaired skin integrity will  decrease Outcome: Progressing   

## 2022-10-17 NOTE — Assessment & Plan Note (Signed)
Foley catheter placed on 15th.  Started on Flomax.  Patient does not want to go home with the Foley catheter.  I explained to the patient normally I will leave the Foley catheter in for a week before taking it out.  I will take it out at 12 midnight to see if the patient urinates by the time I see him tomorrow.  If he does not urinate we may have to place the catheter back and then he would definitely have to go home with the catheter.

## 2022-10-17 NOTE — Plan of Care (Signed)
  Problem: Education: Goal: Ability to describe self-care measures that may prevent or decrease complications (Diabetes Survival Skills Education) will improve Outcome: Progressing   Problem: Cardiac: Goal: Ability to maintain an adequate cardiac output will improve Outcome: Progressing   Problem: Nutritional: Goal: Maintenance of adequate nutrition will improve Outcome: Progressing Goal: Maintenance of adequate weight for body size and type will improve Outcome: Progressing   Problem: Respiratory: Goal: Will regain and/or maintain adequate ventilation Outcome: Progressing   Problem: Pain Managment: Goal: General experience of comfort will improve Outcome: Progressing   Problem: Safety: Goal: Ability to remain free from injury will improve Outcome: Progressing

## 2022-10-17 NOTE — TOC CM/SW Note (Signed)
Transition of Care [facility]) - Inpatient Brief Assessment   Patient Details  Name: Russell [name] MRN: [id number] Date of Birth: October 13, 1965  Transition of Care [facility]) CM/SW Contact:    Liliana Cline, LCSW Phone Number: 10/17/2022, 9:05 AM   Clinical Narrative:    Transition of Care Asessment: Insurance and Status: Insurance coverage has been reviewed Patient has primary care physician: Yes     Prior/Current Home Services: No current home services Social Determinants of Health Reivew: SDOH reviewed no interventions necessary Readmission risk has been reviewed: Yes Transition of care needs: no transition of care needs at this time

## 2022-10-17 NOTE — Progress Notes (Signed)
Progress Note   Patient: Russell [name] [id number]:[id number] DOB: 10-Aug-1965 DOA: 10/12/2022     4 DOS: the patient was seen and examined on 10/17/2022   Brief hospital course: 57 y.o. male with medical history significant of hypertension, hyperlipidemia, diabetes mellitus,dCHF, depression with anxiety, A-flutter not on anticoagulants, morbid obesity, right BKA, chronic venous insufficiency in leg, who presents with abdominal pain, rectal bleeding, left lower leg wounds,   Patient states he has abdominal pain for more than 2 days, which is located in the upper abdomen, constant, aching, 7 out of 10 in severity, nonradiating.  It is not aggravated or alleviated by any known factors.  Associated with nausea, no vomiting.  8/14.  Patient received a unit of blood in the early morning.  Had a black stool when I saw him.  Patient had epigastric pain coming in.  Notified gastroenterology team.  Patient on low-dose Levophed this morning. 8/15.  EGD showing gastritis and nonbleeding gastric ulcers.  Patient converted off insulin drip this morning.  Afternoon hemoglobin down to 7.5 and given a unit of blood. 8/16.  Notified that patient had a maroon-colored stool.  A.m. hemoglobin 7.6.  Will give another unit of blood.  Lasix before and after blood transfusion. 8/17.  Hemoglobin 7.9.  Colonoscopy did not show any active blood in the colon. 8/18.  Hemoglobin 8.3.  Patient seen sitting up.  Complains of a lot of pain and interested in seeing pain management as outpatient.  Patient told nursing staff that he does not want to go home with the Foley catheter.  He is not interested in doing an overnight oximetry to see if he qualifies for nocturnal oxygen.  Assessment and Plan: * Hypovolemic shock (HCC) Resolved.  Patient off Levophed.  Patient required 4 units of packed red blood cells during hospital course.  Upper GI bleed EGD on 8/15 showed nonbleeding gastric ulcers and gastritis.  Colonoscopy on 8/17 did not  show any active bleeding.  Continue to watch hemoglobin.  May end up needing a capsule study if further bleeding.  Acute blood loss anemia Transfused 4 units of packed red blood cells during the hospital course and IV iron.  Last hemoglobin 8.3.  Continue to watch hemoglobin.  Hyperosmolar hyperglycemic state (HHS) (HCC) Patient converted off insulin drip 8/15.  Increase Levemir insulin 35 units twice days plus sliding scale.  Soft diet.  Acute on chronic diastolic CHF (congestive heart failure) (HCC) Continue oral Lasix.  Hyperkalemia Treated earlier in hospital course  Acute respiratory failure with hypoxia (HCC) Likely secondary to fluid overload (IV fluid and blood transfusions).  Patient able to come off oxygen.  He does not want to do a test to see if he qualifies for nocturnal oxygen secondary to his sleep apnea.  Elevated lactic acid level Normalized.    Epigastric abdominal pain Likely with gastric ulcers and gastritis.  AKI (acute kidney injury) (HCC) Creatinine up to 1.6 during hospital course and down to 0.7.  Wound infection_ left lower leg On Keflex.  HLD (hyperlipidemia) On Lipitor  Sciatica of right side On Lyrica and pain medication  Depression with anxiety On Cymbalta  Morbid obesity (HCC) BMI 50.27  Acute urinary retention Foley catheter placed on 15th.  Started on Flomax.  Patient does not want to go home with the Foley catheter.  Either he will have to learn how to do it or we will have to try to take out the catheter before discharge.  Subjective: Patient complaining of a lot of pains.  No bowel movement since colonoscopy.  Feeling a little bit better but still having a lot of pain.  Interested in going home but then told the nursing staff that it does not want to go home with catheter.  Physical Exam: Vitals:   10/16/22 1700 10/16/22 1942 10/17/22 0410 10/17/22 0848  BP:  134/62 (!) 136/57 (!) 144/71  Pulse:  87 78 84  Resp:  18 20  20   Temp:  98.2 F (36.8 C) 98 F (36.7 C) 98.2 F (36.8 C)  TempSrc:  Oral Oral   SpO2: 93% 94% 98% 95%  Weight:      Height:       Physical Exam HENT:     Head: Normocephalic.     Mouth/Throat:     Pharynx: No oropharyngeal exudate.  Eyes:     General: Lids are normal.     Conjunctiva/sclera: Conjunctivae normal.  Cardiovascular:     Rate and Rhythm: Normal rate and regular rhythm.     Heart sounds: Normal heart sounds, S1 normal and S2 normal.  Pulmonary:     Breath sounds: Examination of the right-lower field reveals decreased breath sounds. Examination of the left-lower field reveals decreased breath sounds. Decreased breath sounds present. No wheezing, rhonchi or rales.  Abdominal:     Palpations: Abdomen is soft.     Tenderness: There is no abdominal tenderness.  Musculoskeletal:     Left lower leg: Swelling present.     Right Lower Extremity: Right leg is amputated below knee.  Skin:    General: Skin is warm.     Comments: Wound to left lower extremity anteriorly.  Neurological:     Mental Status: He is alert and oriented to person, place, and time.     Data Reviewed: Creatinine 0.7, hemoglobin 8.3  Family Communication: updated wife on phone  Disposition: Status is: Inpatient Remains inpatient appropriate because: Will have physical therapy evaluate him.  Will watch another day in the hospital for bleeding.  Either have to take out his Foley catheter before going home or teach him how to drain the Foley catheter.  Planned Discharge Destination: Likely Home    Time spent: 28 minutes  Author: Alford Highland, MD 10/17/2022 12:57 PM  For on call review [email or web address].

## 2022-10-17 NOTE — Evaluation (Signed)
Physical Therapy Evaluation Patient Details Name: Russell [name] MRN: [id number] DOB: [birth year] Today's Date: 10/17/2022  History of Present Illness  Patient is a 57 year old male with hypovolemic shock, upper GI bleed, acute blood loss anemia. He required low dose levophed and insulin drip. History of obesity, chronic wound, BKA RLE  Clinical Impression  Patient seated on side of bed on arrival to room. He reports he lives alone and ambulates short distance using his prosthesis at baseline.  The patient declined walking at this time due to general fatigue. He reports he has been walking to and from the bathroom while in the hospital without difficulty. Patient has good sitting balance and repositions independently in sitting. Encouraged patient to elevated LLE periodically for edema management and to perform frequent position changes to prevent against skin breakdown. He does not seem interested in any type of rehabilitation at home at discharge. PT will follow while in the hospital to maximize independence and to decrease caregiver burden.       If plan is discharge home, recommend the following: Assist for transportation   Can travel by private vehicle        Equipment Recommendations None recommended by PT  Recommendations for Other Services       Functional Status Assessment Patient has had a recent decline in their functional status and demonstrates the ability to make significant improvements in function in a reasonable and predictable amount of time.     Precautions / Restrictions Precautions Precautions: Fall Restrictions Weight Bearing Restrictions: No      Mobility  Bed Mobility               General bed mobility comments: not assessed as patient sitting up on arrival and post session    Transfers                   General transfer comment: patient is independent with repositioning while sitting. he declined standing due to fatigue but has been  ambulated to and from bathroom intermittently using prosthesis. encourage frequent/routine repositioning for skin integrity as patient reports he sleeps in a recliner chair at home    Ambulation/Gait                  Stairs            Wheelchair Mobility     Tilt Bed    Modified Rankin (Stroke Patients Only)       Balance Overall balance assessment: Needs assistance Sitting-balance support: Feet supported Sitting balance-Leahy Scale: Good Sitting balance - Comments: no loss of balance with reaching outside base of support or leaning to right side for repositioning clothing                                     Pertinent Vitals/Pain Pain Assessment Pain Assessment: No/denies pain    Home Living Family/patient expects to be discharged to:: Private residence Living Arrangements: Alone Available Help at Discharge: Family;Friend(s);Available PRN/intermittently Type of Home: Apartment Home Access: Level entry       Home Layout: One level Home Equipment: Rollator (4 wheels);Wheelchair - power;Shower seat (prosthesis for RLE)      Prior Function Prior Level of Function : Independent/Modified Independent             Mobility Comments: patient reports he can ambulate a short distance with prosthesis, has rollator if needed but not required  for household ambulation. has a motorized wheelchair if needed, sleeps in a recliner chair ADLs Comments: Mod I for ADLs. does not drive due to vision impairments. has hand wounds from cooking per his report     Extremity/Trunk Assessment   Upper Extremity Assessment Upper Extremity Assessment: Right hand dominant;Generalized weakness (history of peripheiral neuropathy, partial digit amputations. wounds noted on hands (patient reports injuries are from heat while cooking))    Lower Extremity Assessment Lower Extremity Assessment: Generalized weakness (R BKA. history of peripherial neuropathy with decreased  sensation LLE)       Communication   Communication Communication: No apparent difficulties  Cognition Arousal: Alert Behavior During Therapy: WFL for tasks assessed/performed Overall Cognitive Status: Within Functional Limits for tasks assessed                                          General Comments      Exercises     Assessment/Plan    PT Assessment Patient needs continued PT services  PT Problem List Decreased strength;Decreased balance;Decreased activity tolerance;Decreased mobility;Decreased safety awareness       PT Treatment Interventions DME instruction;Gait training;Stair training;Functional mobility training;Therapeutic activities;Therapeutic exercise;Balance training;Neuromuscular re-education;Cognitive remediation;Patient/family education    PT Goals (Current goals can be found in the Care Plan section)  Acute Rehab PT Goals Patient Stated Goal: to go home PT Goal Formulation: With patient Time For Goal Achievement: 10/31/22 Potential to Achieve Goals: Fair    Frequency Min 1X/week     Co-evaluation               AM-PAC PT "6 Clicks" Mobility  Outcome Measure Help needed turning from your back to your side while in a flat bed without using bedrails?: A Little Help needed moving from lying on your back to sitting on the side of a flat bed without using bedrails?: A Little Help needed moving to and from a bed to a chair (including a wheelchair)?: A Little Help needed standing up from a chair using your arms (e.g., wheelchair or bedside chair)?: A Little Help needed to walk in hospital room?: A Little Help needed climbing 3-5 steps with a railing? : A Little 6 Click Score: 18    End of Session   Activity Tolerance: Patient limited by fatigue Patient left:  (seated on edge of bed per patient preference)   PT Visit Diagnosis: Muscle weakness (generalized) (M62.81);Difficulty in walking, not elsewhere classified (R26.2)    Time:  4098-1191 PT Time Calculation (min) (ACUTE ONLY): 23 min   Charges:   PT Evaluation $PT Eval Low Complexity: 1 Low PT Treatments $Therapeutic Activity: 8-22 mins PT General Charges $$ ACUTE PT VISIT: 1 Visit        Donna Bernard, PT, MPT   Ina Homes 10/17/2022, 1:13 PM

## 2022-10-18 ENCOUNTER — Encounter: Payer: Self-pay | Admitting: Gastroenterology

## 2022-10-18 DIAGNOSIS — D62 Acute posthemorrhagic anemia: Secondary | ICD-10-CM | POA: Diagnosis not present

## 2022-10-18 DIAGNOSIS — R571 Hypovolemic shock: Secondary | ICD-10-CM | POA: Diagnosis not present

## 2022-10-18 DIAGNOSIS — R338 Other retention of urine: Secondary | ICD-10-CM | POA: Diagnosis not present

## 2022-10-18 DIAGNOSIS — R1013 Epigastric pain: Secondary | ICD-10-CM | POA: Diagnosis not present

## 2022-10-18 LAB — CULTURE, BLOOD (ROUTINE X 2)
Culture: NO GROWTH
Culture: NO GROWTH
Special Requests: ADEQUATE
Special Requests: ADEQUATE

## 2022-10-18 LAB — GLUCOSE, CAPILLARY
Glucose-Capillary: 215 mg/dL — ABNORMAL HIGH (ref 70–99)
Glucose-Capillary: 234 mg/dL — ABNORMAL HIGH (ref 70–99)
Glucose-Capillary: 251 mg/dL — ABNORMAL HIGH (ref 70–99)
Glucose-Capillary: 354 mg/dL — ABNORMAL HIGH (ref 70–99)

## 2022-10-18 LAB — HEMOGLOBIN: Hemoglobin: 8.3 g/dL — ABNORMAL LOW (ref 13.0–17.0)

## 2022-10-18 MED ORDER — PREDNISONE 20 MG PO TABS
20.0000 mg | ORAL_TABLET | Freq: Every day | ORAL | Status: DC
  Administered 2022-10-18: 20 mg via ORAL
  Filled 2022-10-18: qty 1

## 2022-10-18 MED ORDER — INSULIN DETEMIR 100 UNIT/ML ~~LOC~~ SOLN
45.0000 [IU] | Freq: Two times a day (BID) | SUBCUTANEOUS | Status: DC
  Administered 2022-10-18 (×2): 45 [IU] via SUBCUTANEOUS
  Filled 2022-10-18 (×3): qty 0.45

## 2022-10-18 MED ORDER — NAPHAZOLINE-GLYCERIN 0.012-0.25 % OP SOLN
1.0000 [drp] | Freq: Four times a day (QID) | OPHTHALMIC | Status: DC | PRN
  Administered 2022-10-18 (×2): 2 [drp] via OPHTHALMIC
  Filled 2022-10-18: qty 15

## 2022-10-18 MED ORDER — LIVING WELL WITH DIABETES BOOK
Freq: Once | Status: AC
  Filled 2022-10-18: qty 1

## 2022-10-18 NOTE — Progress Notes (Signed)
Progress Note   Patient: Russell [name] ZOX:[id number] DOB: June 16, 1965 DOA: 10/12/2022     5 DOS: the patient was seen and examined on 10/18/2022   Brief hospital course: 57 y.o. male with medical history significant of hypertension, hyperlipidemia, diabetes mellitus,dCHF, depression with anxiety, A-flutter not on anticoagulants, morbid obesity, right BKA, chronic venous insufficiency in leg, who presents with abdominal pain, rectal bleeding, left lower leg wounds,   Patient states he has abdominal pain for more than 2 days, which is located in the upper abdomen, constant, aching, 7 out of 10 in severity, nonradiating.  It is not aggravated or alleviated by any known factors.  Associated with nausea, no vomiting.  8/14.  Patient received a unit of blood in the early morning.  Had a black stool when I saw him.  Patient had epigastric pain coming in.  Notified gastroenterology team.  Patient on low-dose Levophed this morning. 8/15.  EGD showing gastritis and nonbleeding gastric ulcers.  Patient converted off insulin drip this morning.  Afternoon hemoglobin down to 7.5 and given a unit of blood. 8/16.  Notified that patient had a maroon-colored stool.  A.m. hemoglobin 7.6.  Will give another unit of blood.  Lasix before and after blood transfusion. 8/17.  Hemoglobin 7.9.  Colonoscopy did not show any active blood in the colon. 8/18.  Hemoglobin 8.3.  Patient seen sitting up.  Complains of a lot of pain and interested in seeing pain management as outpatient.  Patient told nursing staff that he does not want to go home with the Foley catheter.  He is not interested in doing an overnight oximetry to see if he qualifies for nocturnal oxygen. 8/19.  Patient does not want to go home with the Foley catheter.  Will take out Foley catheter at 12 midnight tonight and see if he is able to urinate by tomorrow.  I explained that if he does not urinate at the time I see him he will likely have to need a catheter to go  back in and we will have to go home with catheter.  Continue Flomax.  Assessment and Plan: * Acute urinary retention Foley catheter placed on 15th.  Started on Flomax.  Patient does not want to go home with the Foley catheter.  I explained to the patient normally I will leave the Foley catheter in for a week before taking it out.  I will take it out at 12 midnight to see if the patient urinates by the time I see him tomorrow.  If he does not urinate we may have to place the catheter back and then he would definitely have to go home with the catheter.  Hypovolemic shock (HCC) Resolved.  Patient off Levophed.  Patient required 4 units of packed red blood cells during hospital course.  Upper GI bleed EGD on 8/15 showed nonbleeding gastric ulcers and gastritis.  Colonoscopy on 8/17 did not show any active bleeding.   Acute blood loss anemia Transfused 4 units of packed red blood cells during the hospital course and IV iron.  Last hemoglobin 8.3.    Hyperosmolar hyperglycemic state (HHS) (HCC) Patient converted off insulin drip 8/15.  Increase Levemir insulin 45 units twice days plus sliding scale.  Soft diet.  Acute on chronic diastolic CHF (congestive heart failure) (HCC) Continue oral Lasix.  Hyperkalemia Treated earlier in hospital course  Acute respiratory failure with hypoxia (HCC) Likely secondary to fluid overload (IV fluid and blood transfusions).  Patient able to come off oxygen.  He does not want to do a test to see if he qualifies for nocturnal oxygen secondary to his sleep apnea.  Elevated lactic acid level Normalized.    Epigastric abdominal pain Likely with gastric ulcers and gastritis.  AKI (acute kidney injury) (HCC) Creatinine up to 1.6 during hospital course and down to 0.7.  Wound infection_ left lower leg On Keflex.  HLD (hyperlipidemia) On Lipitor  Sciatica of right side On Lyrica and pain medication.  Patient asking for something else for pain.  I did give  a small dose of prednisone to see if this helps his pain.  Depression with anxiety On Cymbalta  Morbid obesity (HCC) BMI 50.27        Subjective: Patient feeling right leg pain on the back of his leg and in his hip.  Patient does not want to go home with the Foley catheter.  No further bleeding.  Physical Exam: Vitals:   10/17/22 1623 10/17/22 1924 10/18/22 0330 10/18/22 0902  BP: (!) 120/49 (!) 128/58 122/89 126/68  Pulse: 86 86 86 92  Resp: 19 20 20 20   Temp: 98 F (36.7 C) 97.7 F (36.5 C) 97.9 F (36.6 C) 98.9 F (37.2 C)  TempSrc:  Oral Oral Oral  SpO2: 100% 98% 100% 99%  Weight:      Height:       Physical Exam HENT:     Head: Normocephalic.     Mouth/Throat:     Pharynx: No oropharyngeal exudate.  Eyes:     General: Lids are normal.     Conjunctiva/sclera: Conjunctivae normal.  Cardiovascular:     Rate and Rhythm: Normal rate and regular rhythm.     Heart sounds: Normal heart sounds, S1 normal and S2 normal.  Pulmonary:     Breath sounds: Examination of the right-lower field reveals decreased breath sounds. Examination of the left-lower field reveals decreased breath sounds. Decreased breath sounds present. No wheezing, rhonchi or rales.  Abdominal:     Palpations: Abdomen is soft.     Tenderness: There is no abdominal tenderness.  Musculoskeletal:     Left lower leg: Swelling present.     Right Lower Extremity: Right leg is amputated below knee.  Skin:    General: Skin is warm.     Comments: Wound to left lower extremity anteriorly.  Neurological:     Mental Status: He is alert and oriented to person, place, and time.     Data Reviewed: Hemoglobin 8.3  Family Communication: Updated patient's wife [name] on the phone  Disposition: Status is: Inpatient Remains inpatient appropriate because: Patient does not want to go home with a Foley catheter.  Will remove Foley catheter at midnight to see if he urinates.  By tomorrow.  Planned Discharge  Destination: Home    Time spent: 28 minutes  Author: Alford Highland, MD 10/18/2022 2:32 PM  For on call review [email or web address].

## 2022-10-18 NOTE — Plan of Care (Signed)
  Problem: Education: Goal: Ability to describe self-care measures that may prevent or decrease complications (Diabetes Survival Skills Education) will improve Outcome: Progressing   Problem: Coping: Goal: Ability to adjust to condition or change in health will improve Outcome: Progressing   Problem: Fluid Volume: Goal: Ability to maintain a balanced intake and output will improve Outcome: Progressing   Problem: Health Behavior/Discharge Planning: Goal: Ability to identify and utilize available resources and services will improve Outcome: Progressing Goal: Ability to manage health-related needs will improve Outcome: Progressing   Problem: Metabolic: Goal: Ability to maintain appropriate glucose levels will improve Outcome: Progressing   Problem: Nutritional: Goal: Maintenance of adequate nutrition will improve Outcome: Progressing Goal: Progress toward achieving an optimal weight will improve Outcome: Progressing   Problem: Skin Integrity: Goal: Risk for impaired skin integrity will decrease Outcome: Progressing   Problem: Tissue Perfusion: Goal: Adequacy of tissue perfusion will improve Outcome: Progressing   Problem: Education: Goal: Ability to describe self-care measures that may prevent or decrease complications (Diabetes Survival Skills Education) will improve Outcome: Progressing   Problem: Cardiac: Goal: Ability to maintain an adequate cardiac output will improve Outcome: Progressing   Problem: Health Behavior/Discharge Planning: Goal: Ability to identify and utilize available resources and services will improve Outcome: Progressing Goal: Ability to manage health-related needs will improve Outcome: Progressing   Problem: Fluid Volume: Goal: Ability to achieve a balanced intake and output will improve Outcome: Progressing   Problem: Metabolic: Goal: Ability to maintain appropriate glucose levels will improve Outcome: Progressing   Problem:  Nutritional: Goal: Maintenance of adequate nutrition will improve Outcome: Progressing Goal: Maintenance of adequate weight for body size and type will improve Outcome: Progressing   Problem: Respiratory: Goal: Will regain and/or maintain adequate ventilation Outcome: Progressing   Problem: Urinary Elimination: Goal: Ability to achieve and maintain adequate renal perfusion and functioning will improve Outcome: Progressing   Problem: Education: Goal: Knowledge of General Education information will improve Description: Including pain rating scale, medication(s)/side effects and non-pharmacologic comfort measures Outcome: Progressing   Problem: Health Behavior/Discharge Planning: Goal: Ability to manage health-related needs will improve Outcome: Progressing   Problem: Clinical Measurements: Goal: Ability to maintain clinical measurements within normal limits will improve Outcome: Progressing Goal: Will remain free from infection Outcome: Progressing Goal: Diagnostic test results will improve Outcome: Progressing Goal: Respiratory complications will improve Outcome: Progressing Goal: Cardiovascular complication will be avoided Outcome: Progressing   Problem: Activity: Goal: Risk for activity intolerance will decrease Outcome: Progressing   Problem: Nutrition: Goal: Adequate nutrition will be maintained Outcome: Progressing   Problem: Coping: Goal: Level of anxiety will decrease Outcome: Progressing   Problem: Elimination: Goal: Will not experience complications related to bowel motility Outcome: Progressing Goal: Will not experience complications related to urinary retention Outcome: Progressing   Problem: Pain Managment: Goal: General experience of comfort will improve Outcome: Progressing   Problem: Safety: Goal: Ability to remain free from injury will improve Outcome: Progressing   Problem: Skin Integrity: Goal: Risk for impaired skin integrity will  decrease Outcome: Progressing   

## 2022-10-18 NOTE — Care Management Important Message (Signed)
Important Message  Patient Details  Name: Russell [name] MRN: [id number] Date of Birth: March 09, 1965   Medicare Important Message Given:  Yes     [name] 10/18/2022, 11:08 AM

## 2022-10-18 NOTE — Progress Notes (Signed)
PT Cancellation Note  Patient Details Name: Russell [name] MRN: [id number] DOB: 16-Apr-1965   Cancelled Treatment:    Reason Eval/Treat Not Completed: Other (comment)  Pt sitting EOB.  Stated he is able to walk in room and don prosthetic as needed.  Feels comfortable with mobility and does not feel he needs therapy at this time.  Pt anticipates discharge tomorrow.  Will continue as appropriate.   Danielle Dess 10/18/2022, 3:02 PM

## 2022-10-19 DIAGNOSIS — R1013 Epigastric pain: Secondary | ICD-10-CM | POA: Diagnosis not present

## 2022-10-19 DIAGNOSIS — R571 Hypovolemic shock: Secondary | ICD-10-CM | POA: Diagnosis not present

## 2022-10-19 DIAGNOSIS — D62 Acute posthemorrhagic anemia: Secondary | ICD-10-CM | POA: Diagnosis not present

## 2022-10-19 DIAGNOSIS — K922 Gastrointestinal hemorrhage, unspecified: Secondary | ICD-10-CM | POA: Diagnosis not present

## 2022-10-19 LAB — GLUCOSE, CAPILLARY: Glucose-Capillary: 309 mg/dL — ABNORMAL HIGH (ref 70–99)

## 2022-10-19 MED ORDER — PANTOPRAZOLE SODIUM 40 MG PO TBEC: 40.0000 mg | DELAYED_RELEASE_TABLET | Freq: Two times a day (BID) | ORAL | 0 refills | Status: DC

## 2022-10-19 MED ORDER — CEPHALEXIN 500 MG PO CAPS: 500.0000 mg | ORAL_CAPSULE | Freq: Four times a day (QID) | ORAL | 0 refills | Status: AC

## 2022-10-19 MED ORDER — INSULIN PEN NEEDLE 33G X 5 MM MISC: 1.0000 | Freq: Two times a day (BID) | 0 refills | Status: DC

## 2022-10-19 MED ORDER — INSULIN DETEMIR 100 UNIT/ML FLEXPEN: 42.0000 [IU] | PEN_INJECTOR | Freq: Two times a day (BID) | SUBCUTANEOUS | 0 refills | Status: DC

## 2022-10-19 MED ORDER — TAMSULOSIN HCL 0.4 MG PO CAPS: 0.4000 mg | ORAL_CAPSULE | Freq: Every day | ORAL | 0 refills | Status: DC

## 2022-10-19 MED ORDER — INSULIN DETEMIR 100 UNIT/ML ~~LOC~~ SOLN
45.0000 [IU] | Freq: Two times a day (BID) | SUBCUTANEOUS | Status: DC
  Administered 2022-10-19: 45 [IU] via SUBCUTANEOUS
  Filled 2022-10-19: qty 0.45

## 2022-10-19 MED ORDER — DULOXETINE HCL 30 MG PO CSDR: 30.0000 mg | DELAYED_RELEASE_CAPSULE | Freq: Three times a day (TID) | ORAL | Status: DC

## 2022-10-19 MED ORDER — INSULIN DETEMIR 100 UNIT/ML ~~LOC~~ SOLN
48.0000 [IU] | Freq: Two times a day (BID) | SUBCUTANEOUS | Status: DC
  Filled 2022-10-19: qty 0.48

## 2022-10-19 MED ORDER — FUROSEMIDE 40 MG PO TABS: 40.0000 mg | ORAL_TABLET | Freq: Two times a day (BID) | ORAL | 0 refills | Status: DC

## 2022-10-19 MED ORDER — HUMULIN R 100 UNIT/ML IJ SOLN: 15.0000 [IU] | Freq: Three times a day (TID) | INTRAMUSCULAR | 0 refills | Status: DC

## 2022-10-19 MED ORDER — OXYCODONE-ACETAMINOPHEN 5-325 MG PO TABS: 1.0000 | ORAL_TABLET | Freq: Four times a day (QID) | ORAL | 0 refills | Status: DC | PRN

## 2022-10-19 NOTE — TOC Transition Note (Signed)
Transition of Care [facility]) - CM/SW Discharge Note   Patient Details  Name: Russell [name] MRN: [id number] Date of Birth: 1965-08-26  [facility] CM/SW Contact:  Chapman Fitch, RN Phone Number: 10/19/2022, 10:02 AM   Clinical Narrative:     Patient to discharge today Patient states he does not have a way home today, and is unable to afford a cab.  Patient request cab voucher. Completed cab voucher provided to RN.  Bedside RN to obtain signature on rider waiver and place in chart         Patient Goals and CMS Choice      Discharge Placement                         Discharge Plan and Services Additional resources added to the After Visit Summary for                                       Social Determinants of Health (SDOH) Interventions SDOH Screenings   Food Insecurity: Food Insecurity Present (06/17/2022)   Received from [facility]  Transportation Needs: Unmet Transportation Needs (06/17/2022)   Received from [facility]  Financial Resource Strain: Medium Risk (06/17/2022)   Received from [facility]  Tobacco Use: Low Risk  (10/16/2022)     Readmission Risk Interventions     No data to display

## 2022-10-19 NOTE — Progress Notes (Signed)
Foley cath dc'd as per md's orders, pt tol well.

## 2022-10-19 NOTE — Progress Notes (Signed)
Foley d/c'd

## 2022-10-19 NOTE — Discharge Summary (Signed)
Physician Discharge Summary   Patient: Russell [name] MRN: [id number] DOB: [birth year]  Admit date:     10/12/2022  Discharge date: 10/19/22  Discharge Physician: Alford Highland   PCP: [name], MD   Recommendations at discharge:   Follow-up PCP 5 days Patient request to see urology as outpatient  Discharge Diagnoses: Principal Problem:   Hypovolemic shock (HCC) Active Problems:   Acute blood loss anemia   Upper GI bleed   Hyperosmolar hyperglycemic state (HHS) (HCC)   Rectal bleeding   Acute urinary retention   Acute on chronic diastolic CHF (congestive heart failure) (HCC)   Hyperkalemia   Elevated lactic acid level   Acute respiratory failure with hypoxia (HCC)   Epigastric abdominal pain   AKI (acute kidney injury) (HCC)   Wound infection_ left lower leg   HLD (hyperlipidemia)   Sciatica of right side   Depression with anxiety   Morbid obesity (HCC)   Multiple gastric ulcers    Hospital Course: 57 y.o. male with medical history significant of hypertension, hyperlipidemia, diabetes mellitus,dCHF, depression with anxiety, A-flutter not on anticoagulants, morbid obesity, right BKA, chronic venous insufficiency in leg, who presents with abdominal pain, rectal bleeding, left lower leg wounds,   Patient states he has abdominal pain for more than 2 days, which is located in the upper abdomen, constant, aching, 7 out of 10 in severity, nonradiating.  It is not aggravated or alleviated by any known factors.  Associated with nausea, no vomiting.  8/14.  Patient received a unit of blood in the early morning.  Had a black stool when I saw him.  Patient had epigastric pain coming in.  Notified gastroenterology team.  Patient on low-dose Levophed this morning. 8/15.  EGD showing gastritis and nonbleeding gastric ulcers.  Patient converted off insulin drip this morning.  Afternoon hemoglobin down to 7.5 and given a unit of blood. 8/16.  Notified that patient had a  maroon-colored stool.  A.m. hemoglobin 7.6.  Will give another unit of blood.  Lasix before and after blood transfusion. 8/17.  Hemoglobin 7.9.  Colonoscopy did not show any active blood in the colon. 8/18.  Hemoglobin 8.3.  Patient seen sitting up.  Complains of a lot of pain and interested in seeing pain management as outpatient.  Patient told nursing staff that he does not want to go home with the Foley catheter.  He is not interested in doing an overnight oximetry to see if he qualifies for nocturnal oxygen. 8/19.  Patient does not want to go home with the Foley catheter.  Will take out Foley catheter at 12 midnight tonight and see if he is able to urinate by tomorrow.  I explained that if he does not urinate at the time I see him he will likely have to need a catheter to go back in and we will have to go home with catheter.  Continue Flomax. 8/20.  Foley catheter removed at 12 midnight and patient stated he urinated at 6 AM.  Will discharge home today.  Assessment and Plan: * Hypovolemic shock (HCC) Resolved.  Patient required Levophed briefly during the hospital course.  Patient required 4 units of packed red blood cells during hospital course.  Upper GI bleed EGD on 8/15 showed nonbleeding gastric ulcers and gastritis.  Colonoscopy on 8/17 did not show any active bleeding.   Acute blood loss anemia Transfused 4 units of packed red blood cells during the hospital course and IV iron.  Last hemoglobin 8.3.  Hyperosmolar hyperglycemic state (HHS) (HCC) Patient converted off insulin drip 8/15.  Discharge Levemir insulin 42 units twice a day.  Patient has a short acting insulin at home advised 15 units prior to meals.  Soft diet.  Acute urinary retention Foley catheter placed on 15th.  Started on Flomax.  Foley catheter removed at midnight and patient urinated at 6 AM.  Will go home without a Foley catheter.  Continue Flomax.  Patient request to go to urology as  outpatient.  Hyperkalemia Treated earlier in hospital course  Acute on chronic diastolic CHF (congestive heart failure) (HCC) Continue oral Lasix.  Acute respiratory failure with hypoxia (HCC) Likely secondary to fluid overload (IV fluid and blood transfusions).  Patient able to come off oxygen.  He does not want to do a test to see if he qualifies for nocturnal oxygen secondary to his sleep apnea.  Elevated lactic acid level Normalized.    Epigastric abdominal pain Resolved.  Likely with gastric ulcers and gastritis.  AKI (acute kidney injury) (HCC) Creatinine up to 1.6 during hospital course and down to 0.7 upon discharge.  Wound infection_ left lower leg On Keflex.  HLD (hyperlipidemia) On Lipitor  Sciatica of right side On Lyrica and pain medication.  Refer to pain management as outpatient.  A few days of pain pills prescribed.  Prednisone did not help his pain.  Depression with anxiety On Cymbalta  Morbid obesity (HCC) BMI 50.27         Consultants: Gastroenterology Procedures performed: EGD and colonoscopy Disposition: Home health Diet recommendation:  Cardiac and Carb modified diet DISCHARGE MEDICATION: Allergies as of 10/19/2022       Reactions   Fluoxetine    Other reaction(s): Hallucination   Shellfish Allergy Hives   Sulfa Antibiotics Hives   Vancomycin    Other reaction(s): Red Man Syndrome   Metformin Nausea Only        Medication List     STOP taking these medications    Aspirin-Caffeine 400-32 MG Tabs   carvedilol 6.25 MG tablet Commonly known as: COREG   clotrimazole 1 % cream Commonly known as: LOTRIMIN   fluticasone 50 MCG/ACT nasal spray Commonly known as: FLONASE   hydrocortisone 2.5 % rectal cream Commonly known as: Anusol-HC   lidocaine 5 % ointment Commonly known as: XYLOCAINE   lisinopril 10 MG tablet Commonly known as: ZESTRIL   meloxicam 15 MG tablet Commonly known as: MOBIC   metoprolol succinate 100 MG  24 hr tablet Commonly known as: TOPROL-XL   oxyCODONE 5 MG immediate release tablet Commonly known as: Roxicodone   torsemide 20 MG tablet Commonly known as: DEMADEX       TAKE these medications    atorvastatin 40 MG tablet Commonly known as: LIPITOR Take 40 mg by mouth daily.   cephALEXin 500 MG capsule Commonly known as: KEFLEX Take 1 capsule (500 mg total) by mouth every 6 (six) hours for 6 doses.   cetirizine 10 MG tablet Commonly known as: ZYRTEC Take 10 mg by mouth daily as needed for allergies.   collagenase 250 UNIT/GM ointment Commonly known as: SANTYL Apply topically daily. What changed:  how much to take when to take this reasons to take this   DULoxetine HCl 30 MG Csdr Take 30 mg by mouth in the morning, at noon, and at bedtime. What changed: how much to take   furosemide 40 MG tablet Commonly known as: LASIX Take 1 tablet (40 mg total) by mouth 2 (two) times daily. What  changed: how much to take   HumuLIN R 100 UNIT/ML injection Generic drug: insulin regular Inject 0.15 mLs (15 Units total) into the skin 3 (three) times daily before meals. What changed:  how much to take Another medication with the same name was removed. Continue taking this medication, and follow the directions you see here.   insulin detemir 100 UNIT/ML FlexPen Commonly known as: LEVEMIR Inject 42 Units into the skin 2 (two) times daily.   Insulin Pen Needle 33G X 5 MM Misc 1 Dose by Does not apply route 2 (two) times daily.   oxyCODONE-acetaminophen 5-325 MG tablet Commonly known as: PERCOCET/ROXICET Take 1 tablet by mouth every 6 (six) hours as needed for severe pain.   pantoprazole 40 MG tablet Commonly known as: PROTONIX Take 1 tablet (40 mg total) by mouth 2 (two) times daily.   potassium chloride SA 20 MEQ tablet Commonly known as: KLOR-CON M Take 20 mEq by mouth 2 (two) times daily as needed (cramps).   pregabalin 150 MG capsule Commonly known as:  LYRICA Take 150 mg by mouth 3 (three) times daily.   senna-docusate 8.6-50 MG tablet Commonly known as: Senokot-S Take 2 tablets by mouth daily as needed for mild constipation.   tamsulosin 0.4 MG Caps capsule Commonly known as: FLOMAX Take 1 capsule (0.4 mg total) by mouth daily after breakfast.        Follow-up Information     Edward Jolly, MD Follow up in 2 week(s).   Specialty: Pain Medicine Why: Your PCP willl need to send a referral  from this appointment. Contact information: [address] [name] [address] [phone]         Riki Altes, MD. Go on 11/10/2022.   Specialty: Urology Why: Katrina Stack 3:00pm. Contact information: [address] [place] [address] [phone]         [name], MD. Go on 10/26/2022.   Specialty: Family Medicine Why: Go at 11:00am. Contact information: [address] [place] [address] [phone]                Discharge Exam: Ceasar Mons Weights   10/12/22 2111 10/13/22 0230  Weight: (!) 170.6 kg (!) 163.5 kg   Physical Exam HENT:     Head: Normocephalic.     Mouth/Throat:     Pharynx: No oropharyngeal exudate.  Eyes:     General: Lids are normal.     Conjunctiva/sclera: Conjunctivae normal.  Cardiovascular:     Rate and Rhythm: Normal rate and regular rhythm.     Heart sounds: Normal heart sounds, S1 normal and S2 normal.  Pulmonary:     Breath sounds: Examination of the right-lower field reveals decreased breath sounds. Examination of the left-lower field reveals decreased breath sounds. Decreased breath sounds present. No wheezing, rhonchi or rales.  Abdominal:     Palpations: Abdomen is soft.     Tenderness: There is no abdominal tenderness.  Musculoskeletal:     Left lower leg: Swelling present.     Right Lower Extremity: Right leg is amputated below knee.  Skin:    General: Skin is warm.     Comments: Wound to left lower extremity anteriorly.   Neurological:     Mental Status: He is alert and oriented to person, place, and time.      Condition at discharge: stable  The results of significant diagnostics from this hospitalization (including imaging, microbiology, ancillary and laboratory) are listed below for reference.  Imaging Studies: DG Chest Port 1 View  Result Date: 10/15/2022 CLINICAL DATA:  Hypoxia. EXAM: PORTABLE CHEST 1 VIEW COMPARISON:  One-view chest x-ray 10/12/2022 FINDINGS: The heart is mildly enlarged, exaggerated by low lung volumes. Moderate pulmonary vascular congestion is present. Mild left basilar airspace opacities are noted. The lungs are otherwise clear. IMPRESSION: 1. Cardiomegaly and moderate pulmonary vascular congestion. 2. Mild left basilar airspace disease likely reflects atelectasis. Infection is not excluded. Electronically Signed   By: Marin Roberts M.D.   On: 10/15/2022 11:30   NM GI Blood Loss  Result Date: 10/13/2022 CLINICAL DATA:  Upper GI bleed. EXAM: NUCLEAR MEDICINE GASTROINTESTINAL BLEEDING SCAN TECHNIQUE: Sequential abdominal images were obtained following intravenous administration of Tc-60m labeled red blood cells. RADIOPHARMACEUTICALS:  19.75 mCi Tc-83m pertechnetate in-vitro labeled red cells. COMPARISON:  CT abdomen pelvis dated 10/13/2022. FINDINGS: Radiotracer noted in the cardiac blood pool and aortoiliac vessels. Physiologic uptake in the spleen and liver. No activity noted conforming to the bowel to suggest active GI bleed. IMPRESSION: No scintigraphic evidence of active GI bleed. Electronically Signed   By: Elgie Collard M.D.   On: 10/13/2022 19:51   CT ABDOMEN PELVIS W CONTRAST  Addendum Date: 10/13/2022   ADDENDUM REPORT: 10/13/2022 00:58 ADDENDUM: There is an area of focal subcutaneous induration identified in the right anterior abdominal wall best seen on image number 30 of series 2. When compared with multiple previous exams dating back to 2020 this area has been  present and likely represents an area of subcutaneous scarring or fat necrosis. Electronically Signed   By: Alcide Clever M.D.   On: 10/13/2022 00:58   Result Date: 10/13/2022 CLINICAL DATA:  Abdominal pain EXAM: CT ABDOMEN AND PELVIS WITH CONTRAST TECHNIQUE: Multidetector CT imaging of the abdomen and pelvis was performed using the standard protocol following bolus administration of intravenous contrast. RADIATION DOSE REDUCTION: This exam was performed according to the departmental dose-optimization program which includes automated exposure control, adjustment of the mA and/or kV according to patient size and/or use of iterative reconstruction technique. CONTRAST:  OMNIPAQUE IOHEXOL 350 MG/ML SOLN COMPARISON:  09/29/2021 FINDINGS: Lower chest: No acute abnormality. Hepatobiliary: No focal liver abnormality is seen. No gallstones, gallbladder wall thickening, or biliary dilatation. Pancreas: Fatty replacement of the pancreas is noted. Spleen: Normal in size without focal abnormality. Adrenals/Urinary Tract: Adrenal glands are within normal limits. Kidneys demonstrate a normal enhancement pattern bilaterally. No renal calculi or obstructive changes are seen. The bladder is partially distended. Stomach/Bowel: No obstructive or inflammatory changes of colon are noted. The appendix has been surgically removed. Small bowel and stomach are unremarkable. Vascular/Lymphatic: No significant vascular findings are present. No enlarged abdominal or pelvic lymph nodes. Reproductive: Prostate is unremarkable. Other: No ascites is noted. Small fat containing umbilical hernia is noted. Musculoskeletal: Degenerative changes of lumbar spine are noted. IMPRESSION: No acute abnormality noted. Electronically Signed: By: Alcide Clever M.D. On: 10/13/2022 00:32   DG Chest Portable 1 View  Result Date: 10/12/2022 CLINICAL DATA:  Epigastric abdominal pain. Pale with bloody bowel movements. EXAM: PORTABLE CHEST 1 VIEW COMPARISON:   None 05/18/2020 FINDINGS: Cardiac enlargement. No vascular congestion, edema, or consolidation. No pleural effusions. No pneumothorax. Mediastinal contours appear intact. IMPRESSION: Cardiac enlargement.  Lungs are clear. Electronically Signed   By: Burman Nieves M.D.   On: 10/12/2022 22:35    Microbiology: Results for orders placed or performed during the hospital encounter of 10/12/22  SARS Coronavirus 2 by RT PCR (hospital order, performed in [facility] hospital lab) *cepheid single result test* Anterior Nasal Swab     Status: None   Collection Time: 10/12/22 11:13 PM   Specimen: Anterior Nasal Swab  Result Value Ref Range Status   SARS Coronavirus 2 by RT PCR NEGATIVE NEGATIVE Final    Comment: (NOTE) SARS-CoV-2 target nucleic acids are NOT DETECTED.  The SARS-CoV-2 RNA is generally detectable in upper and lower respiratory specimens during the acute phase of infection. The lowest concentration of SARS-CoV-2 viral copies this assay can detect is 250 copies / mL. A negative result does not preclude SARS-CoV-2 infection and should not be used as the sole basis for treatment or other patient management decisions.  A negative result may occur with improper specimen collection / handling, submission of specimen other than nasopharyngeal swab, presence of viral mutation(s) within the areas targeted by this assay, and inadequate number of viral copies (<250 copies / mL). A negative result must be combined with clinical observations, patient history, and epidemiological information.  Fact Sheet for Patients:   RoadLapTop.co.za  Fact Sheet for Healthcare Providers: http://kim-miller.com/  This test is not yet approved or  cleared by the [place] FDA and has been authorized for detection and/or diagnosis of SARS-CoV-2 by FDA under an Emergency Use Authorization (EUA).  This EUA will remain in effect (meaning this test can be used) for  the duration of the COVID-19 declaration under Section 564(b)(1) of the Act, 21 U.S.C. section 360bbb-3(b)(1), unless the authorization is terminated or revoked sooner.  Performed at [facility], [address] [place], [place] [address]   MRSA Next Gen by PCR, Nasal     Status: Abnormal   Collection Time: 10/13/22  2:00 AM   Specimen: Nasal Mucosa; Nasal Swab  Result Value Ref Range Status   MRSA by PCR Next Gen DETECTED (A) NOT DETECTED Final    Comment: RESULT CALLED TO, READ BACK BY AND VERIFIED WITH: Maxwell Marion RN @0400  10/13/22 ASW (NOTE) The GeneXpert MRSA Assay (FDA approved for NASAL specimens only), is one component of a comprehensive MRSA colonization surveillance program. It is not intended to diagnose MRSA infection nor to guide or monitor treatment for MRSA infections. Test performance is not FDA approved in patients less than 1 years old. Performed at [facility], [address] [place], [place] [address]   Culture, blood (Routine X 2) w Reflex to ID Panel     Status: None   Collection Time: 10/13/22  2:29 AM   Specimen: BLOOD  Result Value Ref Range Status   Specimen Description BLOOD BLOOD LEFT ARM  Final   Special Requests   Final    BOTTLES DRAWN AEROBIC AND ANAEROBIC Blood Culture adequate volume   Culture   Final    NO GROWTH 5 DAYS Performed at [facility], [address] [place], [place] [address]    Report Status 10/18/2022 FINAL  Final  Culture, blood (Routine X 2) w Reflex to ID Panel     Status: None   Collection Time: 10/13/22  2:41 AM   Specimen: BLOOD  Result Value Ref Range Status   Specimen Description BLOOD BLOOD RIGHT HAND  Final   Special Requests   Final    BOTTLES DRAWN AEROBIC AND ANAEROBIC Blood Culture adequate volume   Culture   Final    NO GROWTH 5 DAYS Performed at [facility], [address], [place], [place] [address]    Report Status 10/18/2022 FINAL  Final  Labs: CBC: Recent Labs  Lab 10/12/22 2216 10/13/22 0229 10/14/22 0130 10/14/22 0420 10/14/22 1448 10/15/22 0525 10/15/22 1619 10/16/22 0020 10/16/22 0528 10/17/22 0356 10/18/22 0447  WBC 10.7*   < > 9.3 8.4  --  10.9*  --   --  7.4 8.2  --   NEUTROABS 8.6*  --   --   --   --   --   --   --   --   --   --   HGB 11.2*   < > 8.6* 8.2*   < > 7.6* 7.9* 8.1* 7.9* 8.3* 8.3*  HCT 35.7*   < > 25.8* 24.6*  --  23.2*  --  25.1* 25.0* 25.5*  --   MCV 82.1   < > 81.9 80.7  --  82.6  --   --  85.6 82.8  --   PLT 244   < > 195 194  --  182  --   --  182 223  --    < > = values in this interval not displayed.   Basic Metabolic Panel: Recent Labs  Lab 10/13/22 0436 10/13/22 0832 10/14/22 0130 10/14/22 0420 10/15/22 0525 10/16/22 0528 10/17/22 0356  NA 131*   < > 132* 135 136 138 136  K 5.3*   < > 4.7 4.3 4.6 3.7 3.7  CL 101   < > 101 104 102 103 99  CO2 24   < > 23 28 28 30 29   GLUCOSE 399*   < > 149* 146* 206* 105* 118*  BUN 35*   < > 32* 29* 15 10 7   CREATININE 1.46*   < > 1.12 1.06 0.88 0.80 0.70  CALCIUM 7.8*   < > 8.0* 8.0* 8.3* 8.2* 8.3*  MG 2.3  --   --  2.3  --   --   --   PHOS 2.7  --   --  4.6  --   --   --    < > = values in this interval not displayed.   Liver Function Tests: Recent Labs  Lab 10/12/22 2216 10/13/22 0436  AST 18 16  ALT 16 12  ALKPHOS 97 70  BILITOT 0.7 0.1*  PROT 6.3* 5.3*  ALBUMIN 2.8* 2.3*   CBG: Recent Labs  Lab 10/18/22 0924 10/18/22 1133 10/18/22 1622 10/18/22 2038 10/19/22 0743  GLUCAP 215* 234* 251* 354* 309*    Discharge time spent: greater than 30 minutes.  Signed: Alford Highland, MD [facility] 10/19/2022

## 2022-10-23 ENCOUNTER — Inpatient Hospital Stay
Admission: EM | Admit: 2022-10-23 | Discharge: 2022-11-08 | DRG: 917 | Disposition: A | Discharge status: Home Health | Payer: 59 | Attending: Internal Medicine | Admitting: Internal Medicine

## 2022-10-23 ENCOUNTER — Encounter: Payer: Self-pay | Admitting: Emergency Medicine

## 2022-10-23 ENCOUNTER — Emergency Department: Payer: 59

## 2022-10-23 ENCOUNTER — Other Ambulatory Visit: Payer: Self-pay

## 2022-10-23 DIAGNOSIS — Z5986 Financial insecurity: Secondary | ICD-10-CM

## 2022-10-23 DIAGNOSIS — I459 Conduction disorder, unspecified: Secondary | ICD-10-CM | POA: Diagnosis present

## 2022-10-23 DIAGNOSIS — E1142 Type 2 diabetes mellitus with diabetic polyneuropathy: Secondary | ICD-10-CM

## 2022-10-23 DIAGNOSIS — E871 Hypo-osmolality and hyponatremia: Secondary | ICD-10-CM | POA: Diagnosis present

## 2022-10-23 DIAGNOSIS — D62 Acute posthemorrhagic anemia: Secondary | ICD-10-CM | POA: Diagnosis present

## 2022-10-23 DIAGNOSIS — Z1152 Encounter for screening for COVID-19: Secondary | ICD-10-CM

## 2022-10-23 DIAGNOSIS — R338 Other retention of urine: Secondary | ICD-10-CM | POA: Diagnosis present

## 2022-10-23 DIAGNOSIS — R339 Retention of urine, unspecified: Secondary | ICD-10-CM | POA: Diagnosis not present

## 2022-10-23 DIAGNOSIS — Z882 Allergy status to sulfonamides status: Secondary | ICD-10-CM

## 2022-10-23 DIAGNOSIS — K922 Gastrointestinal hemorrhage, unspecified: Secondary | ICD-10-CM

## 2022-10-23 DIAGNOSIS — I878 Other specified disorders of veins: Secondary | ICD-10-CM | POA: Diagnosis present

## 2022-10-23 DIAGNOSIS — G4730 Sleep apnea, unspecified: Secondary | ICD-10-CM | POA: Diagnosis present

## 2022-10-23 DIAGNOSIS — Z888 Allergy status to other drugs, medicaments and biological substances status: Secondary | ICD-10-CM

## 2022-10-23 DIAGNOSIS — Z89511 Acquired absence of right leg below knee: Secondary | ICD-10-CM

## 2022-10-23 DIAGNOSIS — E1151 Type 2 diabetes mellitus with diabetic peripheral angiopathy without gangrene: Secondary | ICD-10-CM | POA: Diagnosis present

## 2022-10-23 DIAGNOSIS — Z794 Long term (current) use of insulin: Secondary | ICD-10-CM

## 2022-10-23 DIAGNOSIS — I110 Hypertensive heart disease with heart failure: Secondary | ICD-10-CM | POA: Diagnosis present

## 2022-10-23 DIAGNOSIS — K250 Acute gastric ulcer with hemorrhage: Secondary | ICD-10-CM

## 2022-10-23 DIAGNOSIS — F331 Major depressive disorder, recurrent, moderate: Secondary | ICD-10-CM | POA: Insufficient documentation

## 2022-10-23 DIAGNOSIS — M1712 Unilateral primary osteoarthritis, left knee: Secondary | ICD-10-CM | POA: Diagnosis present

## 2022-10-23 DIAGNOSIS — M5431 Sciatica, right side: Secondary | ICD-10-CM | POA: Diagnosis present

## 2022-10-23 DIAGNOSIS — T39391A Poisoning by other nonsteroidal anti-inflammatory drugs [NSAID], accidental (unintentional), initial encounter: Secondary | ICD-10-CM | POA: Diagnosis not present

## 2022-10-23 DIAGNOSIS — K267 Chronic duodenal ulcer without hemorrhage or perforation: Secondary | ICD-10-CM | POA: Diagnosis present

## 2022-10-23 DIAGNOSIS — Z825 Family history of asthma and other chronic lower respiratory diseases: Secondary | ICD-10-CM

## 2022-10-23 DIAGNOSIS — I5042 Chronic combined systolic (congestive) and diastolic (congestive) heart failure: Secondary | ICD-10-CM | POA: Diagnosis present

## 2022-10-23 DIAGNOSIS — Z91013 Allergy to seafood: Secondary | ICD-10-CM

## 2022-10-23 DIAGNOSIS — F32A Depression, unspecified: Secondary | ICD-10-CM | POA: Insufficient documentation

## 2022-10-23 DIAGNOSIS — K429 Umbilical hernia without obstruction or gangrene: Secondary | ICD-10-CM | POA: Diagnosis present

## 2022-10-23 DIAGNOSIS — Z7902 Long term (current) use of antithrombotics/antiplatelets: Secondary | ICD-10-CM

## 2022-10-23 DIAGNOSIS — K644 Residual hemorrhoidal skin tags: Secondary | ICD-10-CM

## 2022-10-23 DIAGNOSIS — Z818 Family history of other mental and behavioral disorders: Secondary | ICD-10-CM

## 2022-10-23 DIAGNOSIS — Z7982 Long term (current) use of aspirin: Secondary | ICD-10-CM

## 2022-10-23 DIAGNOSIS — Z881 Allergy status to other antibiotic agents status: Secondary | ICD-10-CM

## 2022-10-23 DIAGNOSIS — K5900 Constipation, unspecified: Secondary | ICD-10-CM | POA: Diagnosis not present

## 2022-10-23 DIAGNOSIS — K648 Other hemorrhoids: Secondary | ICD-10-CM | POA: Diagnosis present

## 2022-10-23 DIAGNOSIS — K2900 Acute gastritis without bleeding: Secondary | ICD-10-CM

## 2022-10-23 DIAGNOSIS — Z833 Family history of diabetes mellitus: Secondary | ICD-10-CM

## 2022-10-23 DIAGNOSIS — I10 Essential (primary) hypertension: Secondary | ICD-10-CM | POA: Insufficient documentation

## 2022-10-23 DIAGNOSIS — Z8051 Family history of malignant neoplasm of kidney: Secondary | ICD-10-CM

## 2022-10-23 DIAGNOSIS — Z79899 Other long term (current) drug therapy: Secondary | ICD-10-CM

## 2022-10-23 DIAGNOSIS — Z8249 Family history of ischemic heart disease and other diseases of the circulatory system: Secondary | ICD-10-CM

## 2022-10-23 DIAGNOSIS — Z993 Dependence on wheelchair: Secondary | ICD-10-CM

## 2022-10-23 DIAGNOSIS — R0602 Shortness of breath: Secondary | ICD-10-CM | POA: Diagnosis not present

## 2022-10-23 DIAGNOSIS — E785 Hyperlipidemia, unspecified: Secondary | ICD-10-CM | POA: Diagnosis present

## 2022-10-23 DIAGNOSIS — E1165 Type 2 diabetes mellitus with hyperglycemia: Secondary | ICD-10-CM | POA: Diagnosis present

## 2022-10-23 DIAGNOSIS — F419 Anxiety disorder, unspecified: Secondary | ICD-10-CM | POA: Diagnosis present

## 2022-10-23 DIAGNOSIS — D649 Anemia, unspecified: Secondary | ICD-10-CM

## 2022-10-23 DIAGNOSIS — Z6841 Body Mass Index (BMI) 40.0 and over, adult: Secondary | ICD-10-CM

## 2022-10-23 DIAGNOSIS — K623 Rectal prolapse: Secondary | ICD-10-CM | POA: Diagnosis not present

## 2022-10-23 LAB — COMPREHENSIVE METABOLIC PANEL
ALT: 14 U/L (ref 0–44)
AST: 14 U/L — ABNORMAL LOW (ref 15–41)
Albumin: 2.5 g/dL — ABNORMAL LOW (ref 3.5–5.0)
Alkaline Phosphatase: 73 U/L (ref 38–126)
Anion gap: 8 (ref 5–15)
BUN: 51 mg/dL — ABNORMAL HIGH (ref 6–20)
CO2: 26 mmol/L (ref 22–32)
Calcium: 8 mg/dL — ABNORMAL LOW (ref 8.9–10.3)
Chloride: 98 mmol/L (ref 98–111)
Creatinine, Ser: 1.12 mg/dL (ref 0.61–1.24)
GFR, Estimated: >60 mL/min (ref >60)
Glucose, Bld: 313 mg/dL — ABNORMAL HIGH (ref 70–99)
Potassium: 5 mmol/L (ref 3.5–5.1)
Sodium: 132 mmol/L — ABNORMAL LOW (ref 135–145)
Total Bilirubin: 0.5 mg/dL (ref 0.3–1.2)
Total Protein: 5.3 g/dL — ABNORMAL LOW (ref 6.5–8.1)

## 2022-10-23 LAB — RESP PANEL BY RT-PCR (RSV, FLU A&B, COVID)  RVPGX2
Influenza A by PCR: NEGATIVE
Influenza B by PCR: NEGATIVE
Resp Syncytial Virus by PCR: NEGATIVE
SARS Coronavirus 2 by RT PCR: NEGATIVE

## 2022-10-23 LAB — TROPONIN I (HIGH SENSITIVITY): Troponin I (High Sensitivity): 6 ng/L (ref <18)

## 2022-10-23 LAB — MAGNESIUM: Magnesium: 2.5 mg/dL — ABNORMAL HIGH (ref 1.7–2.4)

## 2022-10-23 LAB — BETA-HYDROXYBUTYRIC ACID: Beta-Hydroxybutyric Acid: 0.21 mmol/L (ref 0.05–0.27)

## 2022-10-23 LAB — BRAIN NATRIURETIC PEPTIDE: B Natriuretic Peptide: 190.6 pg/mL — ABNORMAL HIGH (ref 0.0–100.0)

## 2022-10-23 NOTE — ED Notes (Signed)
Hgb 3.9 - Hct 13 Collected 2319 8/24 Lea  MD Chiquita Loth notified.

## 2022-10-23 NOTE — ED Triage Notes (Signed)
Pt BIB EMS from home with c/o SHOB. Pt's home health nurse said that the pt was weak and had a near-syncopal episode when standing, pressures were 90s systolic. Cbg 411.

## 2022-10-23 NOTE — ED Provider Notes (Signed)
[facility] Provider Note    Event Date/Time   First MD Initiated Contact with Patient 10/23/22 2209     (approximate)   History   Shortness of Breath   HPI Russell [name] is a 57 y.o. male with HTN, HLD, DM2, diastolic heart failure, depression, morbid obesity, right BKA who presents today for shortness of breath.  Patient states he was discharged from the hospital just 4 days ago and since leaving has had worsening body aches, shortness of breath, chest pressure.  He states that he feels like he is having a heart attack.  Notes nausea but no worsening abdominal pain.  Denies melanotic stools or bright red blood per stools.  Denies dysuria.  Notes body aches.  States feels like he is constantly warm.  Reviewing most recent admission notes.  Patient was just admitted for abdominal pain and found to have GI bleeding requiring blood transfusion.  EGD showed gastritis and nonbleeding gastric ulcers.  Colonoscopy did not show any active bleeding.  Patient ultimately discharged after symptoms and laboratory workup improved.     Physical Exam   Triage Vital Signs: ED Triage Vitals  Encounter Vitals Group     BP --      Systolic BP Percentile --      Diastolic BP Percentile --      Pulse --      Resp --      Temp --      Temp src --      SpO2 --      Weight 10/23/22 2209 (!) 359 lb 5.6 oz (163 kg)     Height 10/23/22 2209 5\' 11"  (1.803 m)     Head Circumference --      Peak Flow --      Pain Score 10/23/22 2208 8     Pain Loc --      Pain Education --      Exclude from Growth Chart --     Most recent vital signs: There were no vitals filed for this visit.  Physical Exam: I have reviewed the vital signs and nursing notes. General: Awake, alert, no acute distress.  Nontoxic appearing.  Morbidly obese. Head:  Atraumatic, normocephalic.   ENT:  EOM intact, PERRL. Oral mucosa is pink and moist with no lesions. Neck: Neck is supple with full range of  motion, No meningeal signs. Cardiovascular:  RRR, No murmurs. Peripheral pulses palpable and equal bilaterally. Respiratory:  Symmetrical chest wall expansion.  No rhonchi, rales, or wheezes.  Good air movement throughout.  No use of accessory muscles.   Musculoskeletal:  No cyanosis or edema. Moving extremities with full ROM.  Right BKA Abdomen:  Soft, nontender, distended. Neuro:  GCS 15, moving all four extremities, interacting appropriately. Speech clear. Psych: Anxious Skin:  Warm, dry, no rash.     ED Results / Procedures / Treatments   Labs (all labs ordered are listed, but only abnormal results are displayed) Labs Reviewed  BLOOD GAS, VENOUS - Abnormal; Notable for the following components:      Result Value   Bicarbonate 28.2 (*)    All other components within normal limits  RESP PANEL BY RT-PCR (RSV, FLU A&B, COVID)  [id number]  CBC WITH DIFFERENTIAL/PLATELET  COMPREHENSIVE METABOLIC PANEL  MAGNESIUM  BRAIN NATRIURETIC PEPTIDE  BETA-HYDROXYBUTYRIC ACID  TROPONIN I (HIGH SENSITIVITY)     EKG My EKG interpretation: Rate of 72, normal sinus rhythm, normal axis.  Normal intervals.  No  acute ST elevations or depressions.   RADIOLOGY Independently interpreted chest x-ray with no acute pathology   PROCEDURES:  Critical Care performed: No  Procedures   MEDICATIONS ORDERED IN ED: Medications - No data to display   IMPRESSION / MDM / ASSESSMENT AND PLAN / ED COURSE  I reviewed the triage vital signs and the nursing notes.                              Differential diagnosis includes, but is not limited to, pneumonia, acute CHF exacerbation, ACS, dehydration, failure to thrive, worsening anemia.  Patient's presentation is most consistent with acute complicated illness / injury requiring diagnostic workup.  Patient is a 57 year old male who was recently admitted to the hospital for GI bleed with anemia presenting today for shortness of breath.  Vital signs stable  with no hypoxia on room air.  No evidence of sepsis.  No increased work of breathing.  Chest x-ray largely reassuring.  No evidence of DKA based on venous blood gas.  Patient was signed out to oncoming provider while awaiting completion of laboratory workup and reassessment.  The patient is on the cardiac monitor to evaluate for evidence of arrhythmia and/or significant heart rate changes.     FINAL CLINICAL IMPRESSION(S) / ED DIAGNOSES   Final diagnoses:  Shortness of breath     Rx / DC Orders   ED Discharge Orders     None        Note:  This document was prepared using Dragon voice recognition software and may include unintentional dictation errors.   Janith Lima, MD 10/23/22 [phone]

## 2022-10-24 ENCOUNTER — Inpatient Hospital Stay: Payer: 59

## 2022-10-24 DIAGNOSIS — T39391A Poisoning by other nonsteroidal anti-inflammatory drugs [NSAID], accidental (unintentional), initial encounter: Secondary | ICD-10-CM | POA: Diagnosis present

## 2022-10-24 DIAGNOSIS — F32A Depression, unspecified: Secondary | ICD-10-CM | POA: Insufficient documentation

## 2022-10-24 DIAGNOSIS — K5900 Constipation, unspecified: Secondary | ICD-10-CM | POA: Diagnosis not present

## 2022-10-24 DIAGNOSIS — Z794 Long term (current) use of insulin: Secondary | ICD-10-CM | POA: Diagnosis not present

## 2022-10-24 DIAGNOSIS — Z89511 Acquired absence of right leg below knee: Secondary | ICD-10-CM | POA: Diagnosis not present

## 2022-10-24 DIAGNOSIS — K254 Chronic or unspecified gastric ulcer with hemorrhage: Secondary | ICD-10-CM | POA: Diagnosis not present

## 2022-10-24 DIAGNOSIS — D62 Acute posthemorrhagic anemia: Secondary | ICD-10-CM | POA: Diagnosis present

## 2022-10-24 DIAGNOSIS — K264 Chronic or unspecified duodenal ulcer with hemorrhage: Secondary | ICD-10-CM | POA: Diagnosis not present

## 2022-10-24 DIAGNOSIS — E1142 Type 2 diabetes mellitus with diabetic polyneuropathy: Secondary | ICD-10-CM | POA: Diagnosis present

## 2022-10-24 DIAGNOSIS — E785 Hyperlipidemia, unspecified: Secondary | ICD-10-CM | POA: Diagnosis present

## 2022-10-24 DIAGNOSIS — I459 Conduction disorder, unspecified: Secondary | ICD-10-CM | POA: Diagnosis present

## 2022-10-24 DIAGNOSIS — Z6841 Body Mass Index (BMI) 40.0 and over, adult: Secondary | ICD-10-CM | POA: Diagnosis not present

## 2022-10-24 DIAGNOSIS — R0602 Shortness of breath: Secondary | ICD-10-CM | POA: Diagnosis present

## 2022-10-24 DIAGNOSIS — I878 Other specified disorders of veins: Secondary | ICD-10-CM | POA: Diagnosis present

## 2022-10-24 DIAGNOSIS — F419 Anxiety disorder, unspecified: Secondary | ICD-10-CM | POA: Diagnosis present

## 2022-10-24 DIAGNOSIS — E1165 Type 2 diabetes mellitus with hyperglycemia: Secondary | ICD-10-CM | POA: Diagnosis present

## 2022-10-24 DIAGNOSIS — Z79899 Other long term (current) drug therapy: Secondary | ICD-10-CM | POA: Diagnosis not present

## 2022-10-24 DIAGNOSIS — E1151 Type 2 diabetes mellitus with diabetic peripheral angiopathy without gangrene: Secondary | ICD-10-CM | POA: Diagnosis present

## 2022-10-24 DIAGNOSIS — K250 Acute gastric ulcer with hemorrhage: Secondary | ICD-10-CM | POA: Diagnosis present

## 2022-10-24 DIAGNOSIS — K623 Rectal prolapse: Secondary | ICD-10-CM | POA: Diagnosis not present

## 2022-10-24 DIAGNOSIS — K429 Umbilical hernia without obstruction or gangrene: Secondary | ICD-10-CM | POA: Diagnosis present

## 2022-10-24 DIAGNOSIS — Z8249 Family history of ischemic heart disease and other diseases of the circulatory system: Secondary | ICD-10-CM | POA: Diagnosis not present

## 2022-10-24 DIAGNOSIS — K267 Chronic duodenal ulcer without hemorrhage or perforation: Secondary | ICD-10-CM | POA: Diagnosis present

## 2022-10-24 DIAGNOSIS — Z1152 Encounter for screening for COVID-19: Secondary | ICD-10-CM | POA: Diagnosis not present

## 2022-10-24 DIAGNOSIS — I5042 Chronic combined systolic (congestive) and diastolic (congestive) heart failure: Secondary | ICD-10-CM | POA: Diagnosis present

## 2022-10-24 DIAGNOSIS — I110 Hypertensive heart disease with heart failure: Secondary | ICD-10-CM | POA: Diagnosis present

## 2022-10-24 DIAGNOSIS — I10 Essential (primary) hypertension: Secondary | ICD-10-CM | POA: Insufficient documentation

## 2022-10-24 DIAGNOSIS — F331 Major depressive disorder, recurrent, moderate: Secondary | ICD-10-CM | POA: Diagnosis present

## 2022-10-24 DIAGNOSIS — K2900 Acute gastritis without bleeding: Secondary | ICD-10-CM | POA: Diagnosis not present

## 2022-10-24 DIAGNOSIS — K644 Residual hemorrhoidal skin tags: Secondary | ICD-10-CM | POA: Diagnosis not present

## 2022-10-24 DIAGNOSIS — E871 Hypo-osmolality and hyponatremia: Secondary | ICD-10-CM | POA: Diagnosis present

## 2022-10-24 LAB — BASIC METABOLIC PANEL
Anion gap: 4 — ABNORMAL LOW (ref 5–15)
BUN: 42 mg/dL — ABNORMAL HIGH (ref 6–20)
CO2: 27 mmol/L (ref 22–32)
Calcium: 7.6 mg/dL — ABNORMAL LOW (ref 8.9–10.3)
Chloride: 98 mmol/L (ref 98–111)
Creatinine, Ser: 1.08 mg/dL (ref 0.61–1.24)
GFR, Estimated: >60 mL/min (ref >60)
Glucose, Bld: 321 mg/dL — ABNORMAL HIGH (ref 70–99)
Potassium: 4.4 mmol/L (ref 3.5–5.1)
Sodium: 129 mmol/L — ABNORMAL LOW (ref 135–145)

## 2022-10-24 LAB — GLUCOSE, CAPILLARY
Glucose-Capillary: 347 mg/dL — ABNORMAL HIGH (ref 70–99)
Glucose-Capillary: 366 mg/dL — ABNORMAL HIGH (ref 70–99)

## 2022-10-24 LAB — CBC
HCT: 14.4 % — CL (ref 39.0–52.0)
Hemoglobin: 4.4 g/dL — CL (ref 13.0–17.0)
MCH: 26.2 pg (ref 26.0–34.0)
MCHC: 30.6 g/dL (ref 30.0–36.0)
MCV: 85.7 fL (ref 80.0–100.0)
Platelets: 262 10*3/uL (ref 150–400)
RBC: 1.68 MIL/uL — ABNORMAL LOW (ref 4.22–5.81)
RDW: 15.1 % (ref 11.5–15.5)
WBC: 8.1 10*3/uL (ref 4.0–10.5)
nRBC: 1.4 % — ABNORMAL HIGH (ref 0.0–0.2)

## 2022-10-24 LAB — CBC WITH DIFFERENTIAL/PLATELET
Abs Immature Granulocytes: 0.16 10*3/uL — ABNORMAL HIGH (ref 0.00–0.07)
Basophils Absolute: 0 10*3/uL (ref 0.0–0.1)
Basophils Relative: 0 %
Eosinophils Absolute: 0.1 10*3/uL (ref 0.0–0.5)
Eosinophils Relative: 1 %
HCT: 13 % — CL (ref 39.0–52.0)
Hemoglobin: 3.9 g/dL — CL (ref 13.0–17.0)
Immature Granulocytes: 2 %
Lymphocytes Relative: 15 %
Lymphs Abs: 1.3 10*3/uL (ref 0.7–4.0)
MCH: 25.8 pg — ABNORMAL LOW (ref 26.0–34.0)
MCHC: 30 g/dL (ref 30.0–36.0)
MCV: 86.1 fL (ref 80.0–100.0)
Monocytes Absolute: 0.5 10*3/uL (ref 0.1–1.0)
Monocytes Relative: 6 %
Neutro Abs: 6.5 10*3/uL (ref 1.7–7.7)
Neutrophils Relative %: 76 %
Platelets: 265 10*3/uL (ref 150–400)
RBC: 1.51 MIL/uL — ABNORMAL LOW (ref 4.22–5.81)
RDW: 15.3 % (ref 11.5–15.5)
WBC: 8.6 10*3/uL (ref 4.0–10.5)
nRBC: 2 % — ABNORMAL HIGH (ref 0.0–0.2)

## 2022-10-24 LAB — CBG MONITORING, ED
Glucose-Capillary: 299 mg/dL — ABNORMAL HIGH (ref 70–99)
Glucose-Capillary: 264 mg/dL — ABNORMAL HIGH (ref 70–99)
Glucose-Capillary: 303 mg/dL — ABNORMAL HIGH (ref 70–99)
Glucose-Capillary: 309 mg/dL — ABNORMAL HIGH (ref 70–99)

## 2022-10-24 LAB — PREPARE RBC (CROSSMATCH)

## 2022-10-24 LAB — HEMOGLOBIN AND HEMATOCRIT, BLOOD
HCT: 16.4 % — ABNORMAL LOW (ref 39.0–52.0)
HCT: 17.5 % — ABNORMAL LOW (ref 39.0–52.0)
Hemoglobin: 5.2 g/dL — ABNORMAL LOW (ref 13.0–17.0)
Hemoglobin: 5.6 g/dL — ABNORMAL LOW (ref 13.0–17.0)

## 2022-10-24 LAB — TROPONIN I (HIGH SENSITIVITY): Troponin I (High Sensitivity): 7 ng/L (ref <18)

## 2022-10-24 MED ORDER — PANTOPRAZOLE 80MG IVPB - SIMPLE MED
80.0000 mg | Freq: Once | INTRAVENOUS | Status: AC
  Administered 2022-10-24: 80 mg via INTRAVENOUS
  Filled 2022-10-24: qty 100

## 2022-10-24 MED ORDER — SODIUM CHLORIDE 0.9 % IV SOLN
10.0000 mL/h | Freq: Once | INTRAVENOUS | Status: AC
  Administered 2022-10-26: 10 mL/h via INTRAVENOUS

## 2022-10-24 MED ORDER — PANTOPRAZOLE SODIUM 40 MG PO TBEC: 40.0000 mg | DELAYED_RELEASE_TABLET | Freq: Two times a day (BID) | ORAL | Status: DC

## 2022-10-24 MED ORDER — FUROSEMIDE 40 MG PO TABS: 40.0000 mg | ORAL_TABLET | Freq: Two times a day (BID) | ORAL | Status: DC

## 2022-10-24 MED ORDER — SODIUM CHLORIDE 0.9% IV SOLUTION
Freq: Once | INTRAVENOUS | Status: DC
  Filled 2022-10-24: qty 250

## 2022-10-24 MED ORDER — POTASSIUM CHLORIDE CRYS ER 20 MEQ PO TBCR: 20.0000 meq | EXTENDED_RELEASE_TABLET | Freq: Two times a day (BID) | ORAL | Status: DC | PRN

## 2022-10-24 MED ORDER — LORATADINE 10 MG PO TABS
10.0000 mg | ORAL_TABLET | Freq: Every day | ORAL | Status: DC
  Administered 2022-10-24 – 2022-11-08 (×15): 10 mg via ORAL
  Filled 2022-10-24 (×16): qty 1

## 2022-10-24 MED ORDER — ONDANSETRON HCL 4 MG/2ML IJ SOLN
4.0000 mg | Freq: Four times a day (QID) | INTRAMUSCULAR | Status: DC | PRN
  Administered 2022-10-26 – 2022-11-06 (×6): 4 mg via INTRAVENOUS
  Filled 2022-10-24 (×6): qty 2

## 2022-10-24 MED ORDER — SODIUM CHLORIDE 0.9 % IV SOLN: INTRAVENOUS | Status: DC

## 2022-10-24 MED ORDER — PREGABALIN 75 MG PO CAPS
150.0000 mg | ORAL_CAPSULE | Freq: Three times a day (TID) | ORAL | Status: DC
  Administered 2022-10-24 – 2022-11-08 (×46): 150 mg via ORAL
  Filled 2022-10-24 (×47): qty 2

## 2022-10-24 MED ORDER — PANTOPRAZOLE INFUSION (NEW) - SIMPLE MED
8.0000 mg/h | INTRAVENOUS | Status: DC
  Administered 2022-10-24 – 2022-10-25 (×4): 8 mg/h via INTRAVENOUS
  Filled 2022-10-24 (×7): qty 100

## 2022-10-24 MED ORDER — INSULIN ASPART 100 UNIT/ML IJ SOLN
0.0000 [IU] | INTRAMUSCULAR | Status: DC
  Administered 2022-10-24 (×2): 5 [IU] via SUBCUTANEOUS
  Administered 2022-10-24 (×3): 7 [IU] via SUBCUTANEOUS
  Administered 2022-10-24: 9 [IU] via SUBCUTANEOUS
  Administered 2022-10-25: 1 [IU] via SUBCUTANEOUS
  Administered 2022-10-25: 2 [IU] via SUBCUTANEOUS
  Administered 2022-10-25: 1 [IU] via SUBCUTANEOUS
  Administered 2022-10-25 (×2): 2 [IU] via SUBCUTANEOUS
  Administered 2022-10-25: 1 [IU] via SUBCUTANEOUS
  Administered 2022-10-26: 2 [IU] via SUBCUTANEOUS
  Administered 2022-10-26: 1 [IU] via SUBCUTANEOUS
  Administered 2022-10-26 (×2): 2 [IU] via SUBCUTANEOUS
  Administered 2022-10-27: 4 [IU] via SUBCUTANEOUS
  Administered 2022-10-27 (×2): 3 [IU] via SUBCUTANEOUS
  Administered 2022-10-27 (×3): 2 [IU] via SUBCUTANEOUS
  Administered 2022-10-28 (×2): 3 [IU] via SUBCUTANEOUS
  Administered 2022-10-28: 2 [IU] via SUBCUTANEOUS
  Administered 2022-10-28: 3 [IU] via SUBCUTANEOUS
  Administered 2022-10-28: 2 [IU] via SUBCUTANEOUS
  Administered 2022-10-29: 3 [IU] via SUBCUTANEOUS
  Administered 2022-10-29: 2 [IU] via SUBCUTANEOUS
  Administered 2022-10-29 (×2): 1 [IU] via SUBCUTANEOUS
  Administered 2022-10-30: 2 [IU] via SUBCUTANEOUS
  Administered 2022-10-30: 3 [IU] via SUBCUTANEOUS
  Administered 2022-10-30: 2 [IU] via SUBCUTANEOUS
  Administered 2022-10-30: 3 [IU] via SUBCUTANEOUS
  Administered 2022-10-30: 2 [IU] via SUBCUTANEOUS
  Administered 2022-10-30: 1 [IU] via SUBCUTANEOUS
  Administered 2022-10-31 (×3): 2 [IU] via SUBCUTANEOUS
  Administered 2022-10-31: 1 [IU] via SUBCUTANEOUS
  Administered 2022-10-31: 2 [IU] via SUBCUTANEOUS
  Administered 2022-10-31 – 2022-11-01 (×3): 1 [IU] via SUBCUTANEOUS
  Administered 2022-11-01 (×2): 2 [IU] via SUBCUTANEOUS
  Administered 2022-11-01 (×2): 1 [IU] via SUBCUTANEOUS
  Administered 2022-11-02: 2 [IU] via SUBCUTANEOUS
  Administered 2022-11-02: 3 [IU] via SUBCUTANEOUS
  Administered 2022-11-02: 2 [IU] via SUBCUTANEOUS
  Administered 2022-11-02: 3 [IU] via SUBCUTANEOUS
  Administered 2022-11-02: 1 [IU] via SUBCUTANEOUS
  Administered 2022-11-02: 2 [IU] via SUBCUTANEOUS
  Filled 2022-10-24 (×51): qty 1

## 2022-10-24 MED ORDER — IOHEXOL 300 MG/ML  SOLN
100.0000 mL | Freq: Once | INTRAMUSCULAR | Status: AC | PRN
  Administered 2022-10-24: 100 mL via INTRAVENOUS

## 2022-10-24 MED ORDER — INSULIN DETEMIR 100 UNIT/ML ~~LOC~~ SOLN
42.0000 [IU] | Freq: Two times a day (BID) | SUBCUTANEOUS | Status: DC
  Administered 2022-10-24 – 2022-11-06 (×24): 42 [IU] via SUBCUTANEOUS
  Filled 2022-10-24 (×29): qty 0.42

## 2022-10-24 MED ORDER — ACETAMINOPHEN 650 MG RE SUPP: 650.0000 mg | Freq: Four times a day (QID) | RECTAL | Status: DC | PRN

## 2022-10-24 MED ORDER — OXYCODONE-ACETAMINOPHEN 5-325 MG PO TABS
1.0000 | ORAL_TABLET | Freq: Four times a day (QID) | ORAL | Status: DC | PRN
  Administered 2022-10-24 – 2022-11-08 (×21): 1 via ORAL
  Filled 2022-10-24 (×24): qty 1

## 2022-10-24 MED ORDER — TRAZODONE HCL 50 MG PO TABS
25.0000 mg | ORAL_TABLET | Freq: Every evening | ORAL | Status: DC | PRN
  Administered 2022-10-24 – 2022-11-04 (×5): 25 mg via ORAL
  Filled 2022-10-24 (×5): qty 1

## 2022-10-24 MED ORDER — DULOXETINE HCL 30 MG PO CPEP
30.0000 mg | ORAL_CAPSULE | Freq: Every day | ORAL | Status: DC
  Administered 2022-10-24 – 2022-10-27 (×4): 30 mg via ORAL
  Filled 2022-10-24 (×4): qty 1

## 2022-10-24 MED ORDER — MORPHINE SULFATE (PF) 4 MG/ML IV SOLN
4.0000 mg | Freq: Once | INTRAVENOUS | Status: AC
  Administered 2022-10-24: 4 mg via INTRAVENOUS
  Filled 2022-10-24: qty 1

## 2022-10-24 MED ORDER — ATORVASTATIN CALCIUM 20 MG PO TABS
40.0000 mg | ORAL_TABLET | Freq: Every day | ORAL | Status: DC
  Administered 2022-10-24 – 2022-11-08 (×16): 40 mg via ORAL
  Filled 2022-10-24 (×16): qty 2

## 2022-10-24 MED ORDER — ACETAMINOPHEN 325 MG PO TABS
650.0000 mg | ORAL_TABLET | Freq: Four times a day (QID) | ORAL | Status: DC | PRN
  Administered 2022-10-26: 650 mg via ORAL
  Filled 2022-10-24: qty 2

## 2022-10-24 MED ORDER — ONDANSETRON HCL 4 MG PO TABS: 4.0000 mg | ORAL_TABLET | Freq: Four times a day (QID) | ORAL | Status: DC | PRN

## 2022-10-24 MED ORDER — SENNOSIDES-DOCUSATE SODIUM 8.6-50 MG PO TABS
2.0000 | ORAL_TABLET | Freq: Every day | ORAL | Status: DC | PRN
  Administered 2022-10-28 – 2022-11-05 (×2): 2 via ORAL
  Filled 2022-10-24 (×2): qty 2

## 2022-10-24 MED ORDER — TAMSULOSIN HCL 0.4 MG PO CAPS
0.4000 mg | ORAL_CAPSULE | Freq: Every day | ORAL | Status: DC
  Administered 2022-10-24 – 2022-11-08 (×16): 0.4 mg via ORAL
  Filled 2022-10-24 (×16): qty 1

## 2022-10-24 NOTE — Assessment & Plan Note (Addendum)
-   This is highly suspicious for recurrent GI bleeding likely upper from his gastric ulcers. - The patient will be admitted to the observation progressive unit bed. - We will continue IV Protonix drip. - He will be transfused 2 units of packed red blood cells and will follow posttransfusion H&H.  He may certainly need more units. - Aspirin and Plavix will be stopped. - We will follow serial H&H. - Will check stool Hemoccults. - GI consult will be obtained. - I notified Dr. Mia Creek about the patient.

## 2022-10-24 NOTE — Assessment & Plan Note (Signed)
-   We will continue statin therapy. 

## 2022-10-24 NOTE — Assessment & Plan Note (Signed)
-   We will cautiously resume Zestril.

## 2022-10-24 NOTE — ED Notes (Signed)
Patient alert and oriented.  No signs of distress.  Skin is pale. No SOB , on room air.

## 2022-10-24 NOTE — Assessment & Plan Note (Deleted)
-   The patient will be placed on supplement coverage with NovoLog. - We will resume basal coverage at half his home dose while he is NPO.

## 2022-10-24 NOTE — Progress Notes (Addendum)
PROGRESS NOTE    Russell [name]  DUK:[id number] DOB: 1965-10-08 DOA: 10/23/2022 PCP: [name], MD   Assessment & Plan:   Principal Problem:   Acute blood loss anemia Active Problems:   Dyslipidemia   Type 2 diabetes mellitus with peripheral neuropathy (HCC)   Anxiety and depression   Essential hypertension   Acute gastric ulcer with hemorrhage  Assessment and Plan:  Acute blood loss anemia: likely secondary to recurrent upper GI bleed. Hx of gastric ulcers. Continue on IV PPI. Holding home aspirin, plavix. S/p 2 units of pRBCs transfused but repeat H&H is 5.2&16.4. Will transfuse 2 units more of pRBCs. Repeat H&H 4 hours post transfusion  Likely upper GI bleed: likely secondary to gastric ulcers. Pt went home and took [facility] powder b/c he "couldn't get to the pharmacy to pick up the other pain med he was prescribed". GI consulted  Hyponatremia: labile. Will continue to monitor    HLD: continue on statin    DM2: likely poorly controlled. Continue on levemir, SSI w/ accuchecks  Hx of right BKA: continue w/ supportive care  Peripheral neuropathy: continue on home dose of pregabalin   Depression: severity unknown. Continue on home dose of duloxetine. Hold home nortriptyline  Morbid obesity: BMI 52.2. Complicates overall care & prognosis    DVT prophylaxis: SCDs Code Status: full  Family Communication:  Disposition Plan: depends on PT/OT recs (not consulted yet)   Level of care: Progressive Status is: Inpatient Remains inpatient appropriate because: severity of illness     Consultants:  GI   Procedures:   Antimicrobials:   Subjective: Pt c/o malaise & feeling cold   Objective: Vitals:   10/24/22 0930 10/24/22 1030 10/24/22 1200 10/24/22 1300  BP: (!) 118/48 (!) 119/52 (!) 117/46 (!) 107/44  Pulse: 71 72 78 79  Resp: 10 17 10  (!) 9  Temp:      TempSrc:      SpO2: 99% 100% 100% 100%  Weight:      Height:        Intake/Output Summary (Last 24  hours) at 10/24/2022 1420 Last data filed at 10/24/2022 0905 Gross per 24 hour  Intake 600 ml  Output --  Net 600 ml   Filed Weights   10/23/22 2209  Weight: (!) 170 kg    Examination:  General exam: Appears uncomfortable. Morbidly obese Respiratory system: decreased breath sounds b/l  Cardiovascular system: S1 & S2 +. No rubs, gallops or clicks.  Gastrointestinal system: Abdomen is obese, soft and nontender.  Normal bowel sounds heard. Central nervous system: Alert and oriented.  Psychiatry: Judgement and insight appears poor. Flat mood and affect     Data Reviewed: I have personally reviewed following labs and imaging studies  CBC: Recent Labs  Lab 10/18/22 0447 10/23/22 2241 10/24/22 0635 10/24/22 1257  WBC  --  8.6 8.1  --   NEUTROABS  --  6.5  --   --   HGB 8.3* 3.9* 4.4* 5.2*  HCT  --  13.0* 14.4* 16.4*  MCV  --  86.1 85.7  --   PLT  --  265 262  --    Basic Metabolic Panel: Recent Labs  Lab 10/23/22 2241 10/24/22 0635  NA 132* 129*  K 5.0 4.4  CL 98 98  CO2 26 27  GLUCOSE 313* 321*  BUN 51* 42*  CREATININE 1.12 1.08  CALCIUM 8.0* 7.6*  MG 2.5*  --    GFR: Estimated Creatinine Clearance: 119.4 mL/min (by  C-G formula based on SCr of 1.08 mg/dL). Liver Function Tests: Recent Labs  Lab 10/23/22 2241  AST 14*  ALT 14  ALKPHOS 73  BILITOT 0.5  PROT 5.3*  ALBUMIN 2.5*   No results for input(s): "LIPASE", "AMYLASE" in the last 168 hours. No results for input(s): "AMMONIA" in the last 168 hours. Coagulation Profile: No results for input(s): "INR", "PROTIME" in the last 168 hours. Cardiac Enzymes: No results for input(s): "CKTOTAL", "CKMB", "CKMBINDEX", "TROPONINI" in the last 168 hours. BNP (last 3 results) No results for input(s): "PROBNP" in the last 8760 hours. HbA1C: No results for input(s): "HGBA1C" in the last 72 hours. CBG: Recent Labs  Lab 10/18/22 2038 10/19/22 0743 10/24/22 0422 10/24/22 0749 10/24/22 1146  GLUCAP 354* 309*  299* 264* 303*   Lipid Profile: No results for input(s): "CHOL", "HDL", "LDLCALC", "TRIG", "CHOLHDL", "LDLDIRECT" in the last 72 hours. Thyroid Function Tests: No results for input(s): "TSH", "T4TOTAL", "FREET4", "T3FREE", "THYROIDAB" in the last 72 hours. Anemia Panel: No results for input(s): "VITAMINB12", "FOLATE", "FERRITIN", "TIBC", "IRON", "RETICCTPCT" in the last 72 hours. Sepsis Labs: No results for input(s): "PROCALCITON", "LATICACIDVEN" in the last 168 hours.  Recent Results (from the past 240 hour(s))  Resp panel by RT-PCR (RSV, Flu A&B, Covid) Anterior Nasal Swab     Status: None   Collection Time: 10/23/22 10:41 PM   Specimen: Anterior Nasal Swab  Result Value Ref Range Status   SARS Coronavirus 2 by RT PCR NEGATIVE NEGATIVE Final    Comment: (NOTE) SARS-CoV-2 target nucleic acids are NOT DETECTED.  The SARS-CoV-2 RNA is generally detectable in upper respiratory specimens during the acute phase of infection. The lowest concentration of SARS-CoV-2 viral copies this assay can detect is 138 copies/mL. A negative result does not preclude SARS-Cov-2 infection and should not be used as the sole basis for treatment or other patient management decisions. A negative result may occur with  improper specimen collection/handling, submission of specimen other than nasopharyngeal swab, presence of viral mutation(s) within the areas targeted by this assay, and inadequate number of viral copies(<138 copies/mL). A negative result must be combined with clinical observations, patient history, and epidemiological information. The expected result is Negative.  Fact Sheet for Patients:  BloggerCourse.com  Fact Sheet for Healthcare Providers:  SeriousBroker.it  This test is no t yet approved or cleared by the [place] FDA and  has been authorized for detection and/or diagnosis of SARS-CoV-2 by FDA under an Emergency Use  Authorization (EUA). This EUA will remain  in effect (meaning this test can be used) for the duration of the COVID-19 declaration under Section 564(b)(1) of the Act, 21 U.S.C.section 360bbb-3(b)(1), unless the authorization is terminated  or revoked sooner.       Influenza A by PCR NEGATIVE NEGATIVE Final   Influenza B by PCR NEGATIVE NEGATIVE Final    Comment: (NOTE) The Xpert Xpress SARS-CoV-2/FLU/RSV plus assay is intended as an aid in the diagnosis of influenza from Nasopharyngeal swab specimens and should not be used as a sole basis for treatment. Nasal washings and aspirates are unacceptable for Xpert Xpress SARS-CoV-2/FLU/RSV testing.  Fact Sheet for Patients: BloggerCourse.com  Fact Sheet for Healthcare Providers: SeriousBroker.it  This test is not yet approved or cleared by the [place] FDA and has been authorized for detection and/or diagnosis of SARS-CoV-2 by FDA under an Emergency Use Authorization (EUA). This EUA will remain in effect (meaning this test can be used) for the duration of the COVID-19 declaration under Section  564(b)(1) of the Act, 21 U.S.C. section 360bbb-3(b)(1), unless the authorization is terminated or revoked.     Resp Syncytial Virus by PCR NEGATIVE NEGATIVE Final    Comment: (NOTE) Fact Sheet for Patients: BloggerCourse.com  Fact Sheet for Healthcare Providers: SeriousBroker.it  This test is not yet approved or cleared by the [place] FDA and has been authorized for detection and/or diagnosis of SARS-CoV-2 by FDA under an Emergency Use Authorization (EUA). This EUA will remain in effect (meaning this test can be used) for the duration of the COVID-19 declaration under Section 564(b)(1) of the Act, 21 U.S.C. section 360bbb-3(b)(1), unless the authorization is terminated or revoked.  Performed at [facility], [address] [place], [place] [address]          Radiology Studies: DG Chest Portable 1 View  Result Date: 10/23/2022 CLINICAL DATA:  Shortness of breath and chest pain.  History of CHF. EXAM: PORTABLE CHEST 1 VIEW COMPARISON:  Most recent radiograph 10/12/2022 FINDINGS: Cardiomegaly is stable. Vascular congestion is unchanged. There is improving left lung base opacity. No new airspace disease. No large pleural effusion on this AP view. No pneumothorax. IMPRESSION: 1. Stable cardiomegaly and vascular congestion. 2. Improving left lung base opacity. Electronically Signed   By: Narda Rutherford M.D.   On: 10/23/2022 23:17        Scheduled Meds:  sodium chloride   Intravenous Once   sodium chloride   Intravenous Once   atorvastatin  40 mg Oral Daily   DULoxetine  30 mg Oral Daily   insulin aspart  0-9 Units Subcutaneous Q4H   insulin detemir  42 Units Subcutaneous BID   loratadine  10 mg Oral Daily   pregabalin  150 mg Oral TID   tamsulosin  0.4 mg Oral QPC breakfast   Continuous Infusions:  sodium chloride     pantoprazole 8 mg/hr (10/24/22 1124)     LOS: 0 days    Time spent: 35 mins    Charise Killian, MD [facility] Pager [phone]  If 7PM-7AM, please contact night-coverage [email or web address] 10/24/2022, 2:20 PM

## 2022-10-24 NOTE — ED Notes (Signed)
Pt assisted to toilet at bedside. Pt able to stand with one person assist. Pt able to ambulate from bed to toilet using walker

## 2022-10-24 NOTE — ED Provider Notes (Signed)
-----------------------------------------   12:00 AM on 10/24/2022 -----------------------------------------   Critical hemoglobin 3.9.  This is the second draw as first 1 was similarly critically low.  Patient is extremely pale.  Consents to blood transfusion.  Will add IV Protonix bolus with infusion as recent EGD demonstrated gastritis with nonbleeding ulcers; unremarkable colonoscopy.  Will consult hospitalist services for evaluation and admission.   Irean Hong, MD 10/24/22 [phone]
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
## 2022-10-24 NOTE — Consult Note (Signed)
Consultation  Referring Provider:    Hospitalist  Admit date: 8/24/20204 Consult date: 10/24/2022         Reason for Consultation: Anemia              HPI:   Russell [name] is a 57 y.o. gentleman with history of morbid obesity, PVD, Hypertension, DM II, and HFpEF here with severe anemia without GI bleeding. He was discharged about 5 days ago from hospitalization for melena where an EGD showed some gastric ulcers without high risk stigmata. A colonoscopy was also performed with poor prep but brown stool and no large masses seen. His chief complaint on this presentation was severe shortness of breath. He endorses sitting in front of a fan in order to try to get enough air. He endorses taking a goodies powder after hospitalization as he had not picked up his discharge medications yet. He denies any melena and notes that it seems his melena had resolved by the time he was at home. He has history of right BKA and an appendectomy. No blood thinners. He does have significant right leg pain that he attributes to sciatica although the symptomatology is different as this usually affects him at night where as now it bothers him during the day time. No other symptoms such as abdominal pain or chest pain.  Past Medical History:  Diagnosis Date   Anxiety    CHF (congestive heart failure) (HCC)    EF 30-35%   Chronic diastolic (congestive) heart failure (HCC)    Depression    Diabetes mellitus without complication (HCC)    Hyperlipemia    Hypertension    Morbid obesity (HCC)    Peripheral edema     Past Surgical History:  Procedure Laterality Date   APPENDECTOMY     BELOW KNEE LEG AMPUTATION     Right leg   BIOPSY  10/14/2022   Procedure: BIOPSY;  Surgeon: Norma Fredrickson, Boykin Nearing, MD;  Location: [facility] ENDOSCOPY;  Service: Gastroenterology;;   COLONOSCOPY N/A 10/16/2022   Procedure: COLONOSCOPY;  Surgeon: [name], MD;  Location: [facility] ENDOSCOPY;  Service: Gastroenterology;  Laterality: N/A;    ESOPHAGOGASTRODUODENOSCOPY (EGD) WITH PROPOFOL N/A 10/14/2022   Procedure: ESOPHAGOGASTRODUODENOSCOPY (EGD) WITH PROPOFOL;  Surgeon: Toledo, Boykin Nearing, MD;  Location: [facility] ENDOSCOPY;  Service: Gastroenterology;  Laterality: N/A;   TONSILLECTOMY      Family History  Problem Relation Age of Onset   COPD Mother    Diabetes Father    CAD Father    Kidney cancer Brother      Social History   Tobacco Use   Smoking status: Never   Smokeless tobacco: Never  Vaping Use   Vaping status: Never Used  Substance Use Topics   Alcohol use: No   Drug use: No    Prior to Admission medications   Medication Sig Start Date End Date Taking? Authorizing Provider  atorvastatin (LIPITOR) 40 MG tablet Take 40 mg by mouth daily.    Yes [provider]  DULoxetine HCl 30 MG CSDR Take 30 mg by mouth in the morning, at noon, and at bedtime. 10/19/22  Yes Wieting, Richard, MD  furosemide (LASIX) 40 MG tablet Take 1 tablet (40 mg total) by mouth 2 (two) times daily. 10/19/22  Yes Wieting, Richard, MD  HUMULIN R 100 UNIT/ML injection Inject 0.15 mLs (15 Units total) into the skin 3 (three) times daily before meals. 10/19/22  Yes Wieting, Richard, MD  insulin detemir (LEVEMIR) 100 UNIT/ML FlexPen Inject 42 Units into  the skin 2 (two) times daily. 10/19/22  Yes Wieting, Richard, MD  pantoprazole (PROTONIX) 40 MG tablet Take 1 tablet (40 mg total) by mouth 2 (two) times daily. 10/19/22  Yes Wieting, Richard, MD  potassium chloride SA (K-DUR,KLOR-CON) 20 MEQ tablet Take 20 mEq by mouth 2 (two) times daily as needed (cramps). 11/08/17  Yes [provider]  pregabalin (LYRICA) 150 MG capsule Take 150 mg by mouth 3 (three) times daily.    Yes [provider]  tamsulosin (FLOMAX) 0.4 MG CAPS capsule Take 1 capsule (0.4 mg total) by mouth daily after breakfast. 10/19/22  Yes Wieting, Richard, MD  cetirizine (ZYRTEC) 10 MG tablet Take 10 mg by mouth daily as needed for allergies. 06/23/20   [provider]  collagenase (SANTYL) ointment Apply topically daily. Patient not taking: Reported on 10/24/2022 02/02/18   Adrian Saran, MD  Insulin Pen Needle 33G X 5 MM MISC 1 Dose by Does not apply route 2 (two) times daily. 10/19/22   Alford Highland, MD  oxyCODONE-acetaminophen (PERCOCET/ROXICET) 5-325 MG tablet Take 1 tablet by mouth every 6 (six) hours as needed for severe pain. 10/19/22   Alford Highland, MD  senna-docusate (SENOKOT-S) 8.6-50 MG tablet Take 2 tablets by mouth daily as needed for mild constipation.    [provider]    Current Facility-Administered Medications  Medication Dose Route Frequency Provider Last Rate Last Admin   0.9 %  sodium chloride infusion (Manually program via Guardrails IV Fluids)   Intravenous Once Charise Killian, MD       0.9 %  sodium chloride infusion  10 mL/hr Intravenous Once Sung, Jade J, MD       0.9 %  sodium chloride infusion   Intravenous Continuous Mansy, Vernetta Honey, MD   Held at 10/24/22 0132   acetaminophen (TYLENOL) tablet 650 mg  650 mg Oral Q6H PRN Mansy, Jan A, MD       Or   acetaminophen (TYLENOL) suppository 650 mg  650 mg Rectal Q6H PRN Mansy, Jan A, MD       atorvastatin (LIPITOR) tablet 40 mg  40 mg Oral Daily Mansy, Jan A, MD   40 mg at 10/24/22 0841   DULoxetine (CYMBALTA) DR capsule 30 mg  30 mg Oral Daily Mansy, Jan A, MD   30 mg at 10/24/22 0841   insulin aspart (novoLOG) injection 0-9 Units  0-9 Units Subcutaneous Q4H Mansy, Jan A, MD   7 Units at 10/24/22 1231   insulin detemir (LEVEMIR) injection 42 Units  42 Units Subcutaneous BID Mansy, Jan A, MD   42 Units at 10/24/22 1017   loratadine (CLARITIN) tablet 10 mg  10 mg Oral Daily Mansy, Jan A, MD   10 mg at 10/24/22 0842   ondansetron (ZOFRAN) tablet 4 mg  4 mg Oral Q6H PRN Mansy, Jan A, MD       Or   ondansetron [facility]) injection 4 mg  4 mg Intravenous Q6H PRN Mansy, Jan A, MD       oxyCODONE-acetaminophen (PERCOCET/ROXICET) 5-325 MG per tablet 1 tablet  1  tablet Oral Q6H PRN Mansy, Jan A, MD   1 tablet at 10/24/22 0259   pantoprozole (PROTONIX) 80 mg /NS 100 mL infusion  8 mg/hr Intravenous Continuous Irean Hong, MD 10 mL/hr at 10/24/22 1124 8 mg/hr at 10/24/22 1124   potassium chloride SA (KLOR-CON M) CR tablet 20 mEq  20 mEq Oral BID PRN Mansy, Vernetta Honey, MD  pregabalin (LYRICA) capsule 150 mg  150 mg Oral TID Mansy, Jan A, MD   150 mg at 10/24/22 1610   senna-docusate (Senokot-S) tablet 2 tablet  2 tablet Oral Daily PRN Mansy, Jan A, MD       tamsulosin [facility]) capsule 0.4 mg  0.4 mg Oral QPC breakfast Mansy, Jan A, MD   0.4 mg at 10/24/22 0841   traZODone (DESYREL) tablet 25 mg  25 mg Oral QHS PRN Mansy, Vernetta Honey, MD       Current Outpatient Medications  Medication Sig Dispense Refill   atorvastatin (LIPITOR) 40 MG tablet Take 40 mg by mouth daily.      DULoxetine HCl 30 MG CSDR Take 30 mg by mouth in the morning, at noon, and at bedtime.     furosemide (LASIX) 40 MG tablet Take 1 tablet (40 mg total) by mouth 2 (two) times daily. 60 tablet 0   HUMULIN R 100 UNIT/ML injection Inject 0.15 mLs (15 Units total) into the skin 3 (three) times daily before meals. 10 mL 0   insulin detemir (LEVEMIR) 100 UNIT/ML FlexPen Inject 42 Units into the skin 2 (two) times daily. 30 mL 0   pantoprazole (PROTONIX) 40 MG tablet Take 1 tablet (40 mg total) by mouth 2 (two) times daily. 60 tablet 0   potassium chloride SA (K-DUR,KLOR-CON) 20 MEQ tablet Take 20 mEq by mouth 2 (two) times daily as needed (cramps).  3   pregabalin (LYRICA) 150 MG capsule Take 150 mg by mouth 3 (three) times daily.      tamsulosin (FLOMAX) 0.4 MG CAPS capsule Take 1 capsule (0.4 mg total) by mouth daily after breakfast. 30 capsule 0   cetirizine (ZYRTEC) 10 MG tablet Take 10 mg by mouth daily as needed for allergies.     collagenase (SANTYL) ointment Apply topically daily. (Patient not taking: Reported on 10/24/2022) 15 g 0   Insulin Pen Needle 33G X 5 MM MISC 1 Dose by Does not apply  route 2 (two) times daily. 200 each 0   oxyCODONE-acetaminophen (PERCOCET/ROXICET) 5-325 MG tablet Take 1 tablet by mouth every 6 (six) hours as needed for severe pain. 16 tablet 0   senna-docusate (SENOKOT-S) 8.6-50 MG tablet Take 2 tablets by mouth daily as needed for mild constipation.      Allergies as of 10/23/2022 - Review Complete 10/23/2022  Allergen Reaction Noted   Fluoxetine  10/04/2017   Shellfish allergy Hives 11/20/2014   Sulfa antibiotics Hives 07/07/2015   Vancomycin  02/15/2018   Metformin Nausea Only 08/03/2017     Review of Systems:    All systems reviewed and negative except where noted in HPI.  Review of Systems  Constitutional:  Positive for malaise/fatigue. Negative for chills and fever.  Respiratory:  Positive for shortness of breath.   Cardiovascular:  Positive for leg swelling. Negative for chest pain.  Gastrointestinal:  Negative for abdominal pain, blood in stool, constipation, diarrhea and melena.  Musculoskeletal:  Positive for joint pain.  Skin:  Negative for rash.  Neurological:  Negative for focal weakness.  Psychiatric/Behavioral:  Negative for substance abuse.   All other systems reviewed and are negative.      Physical Exam:  Vital signs in last 24 hours: Temp:  [97.7 F (36.5 C)-98 F (36.7 C)] 97.9 F (36.6 C) (08/25 0904) Pulse Rate:  [71-84] 72 (08/25 1030) Resp:  [9-19] 17 (08/25 1030) BP: (100-137)/(39-68) 119/52 (08/25 1030) SpO2:  [75 %-100 %] 100 % (08/25 1030) Weight:  [  170 kg] 170 kg (08/24 2209) Last BM Date : 10/23/22 General:   Chronically ill appearing, appears somewhat short of breath Head:  Normocephalic and atraumatic. Eyes:   No icterus.   Conjunctiva pink. Mouth: Mucosa pink moist, no lesions. Neck:  Supple; no masses felt Lungs:  Mild shortness of breath when talking Abdomen:   soft, obese abdomen Msk:  Right BKA. No swelling Neurologic:  Alert and  oriented x4;  No focal deficits Skin:  Left chronic venous  changes Psych:  Alert and cooperative. Normal affect.  LAB RESULTS: Recent Labs    10/23/22 2241 10/24/22 0635  WBC 8.6 8.1  HGB 3.9* 4.4*  HCT 13.0* 14.4*  PLT 265 262   BMET Recent Labs    10/23/22 2241 10/24/22 0635  NA 132* 129*  K 5.0 4.4  CL 98 98  CO2 26 27  GLUCOSE 313* 321*  BUN 51* 42*  CREATININE 1.12 1.08  CALCIUM 8.0* 7.6*   LFT Recent Labs    10/23/22 2241  PROT 5.3*  ALBUMIN 2.5*  AST 14*  ALT 14  ALKPHOS 73  BILITOT 0.5   PT/INR No results for input(s): "LABPROT", "INR" in the last 72 hours.  STUDIES: DG Chest Portable 1 View  Result Date: 10/23/2022 CLINICAL DATA:  Shortness of breath and chest pain.  History of CHF. EXAM: PORTABLE CHEST 1 VIEW COMPARISON:  Most recent radiograph 10/12/2022 FINDINGS: Cardiomegaly is stable. Vascular congestion is unchanged. There is improving left lung base opacity. No new airspace disease. No large pleural effusion on this AP view. No pneumothorax. IMPRESSION: 1. Stable cardiomegaly and vascular congestion. 2. Improving left lung base opacity. Electronically Signed   By: Narda Rutherford M.D.   On: 10/23/2022 23:17       Impression / Plan:   57 y/o gentleman with history of morbid obesity, PVD, Hypertension, DM II, and HFpEF here with severe anemia without GI bleeding. Somewhat confusing case as he has dropped 4 grams without any overt GI bleeding. His ulcers did not have any high risk stigmata of bleeding and colonoscopy without any obvious lesions or any blood. Given no overt GI bleeding and no high risk stigmata on his ulcers or any blood on recent colonoscopy, it's hard to attribute his severe anemia to PUD. Likely would still repeat EGD once he is resuscitated but will also order CT abdomen with contrast to make sure no spontaneous retroperitoneal hematoma  - will order CT abdomen pelvis with contrast to rule out RP bleed - maintain two large bore IV's - transfuse to keep hemoglobin > 7, may need diuresis  between transfusions given his history of HFpEF - ok for clears today, NPO at midnight for possible EGD tomorrow if clinically stable and CT is negative - IV PPI BID is fine - correct electrolytes  Will continue to follow, please call with any questions or concerns.  Merlyn Lot MD, MPH [facility]

## 2022-10-24 NOTE — Assessment & Plan Note (Addendum)
-   We will continue Cymbalta and hold off Pamelor.

## 2022-10-24 NOTE — Assessment & Plan Note (Signed)
-   The patient will be placed on supplement coverage with NovoLog. - We will resume basal coverage at half his home dose while he is NPO. - We will continue Lyrica.

## 2022-10-24 NOTE — ED Notes (Signed)
Critical Hgb: 4.4  Collected: 0635 Lab: Zella Ball  Md notified: Fabienne Bruns

## 2022-10-24 NOTE — ED Provider Notes (Incomplete)
-----------------------------------------   12:00 AM on 10/24/2022 -----------------------------------------

## 2022-10-25 DIAGNOSIS — K254 Chronic or unspecified gastric ulcer with hemorrhage: Secondary | ICD-10-CM

## 2022-10-25 DIAGNOSIS — D62 Acute posthemorrhagic anemia: Secondary | ICD-10-CM | POA: Diagnosis not present

## 2022-10-25 LAB — CBC
HCT: 18.5 % — ABNORMAL LOW (ref 39.0–52.0)
Hemoglobin: 5.7 g/dL — ABNORMAL LOW (ref 13.0–17.0)
MCH: 25.9 pg — ABNORMAL LOW (ref 26.0–34.0)
MCHC: 30.8 g/dL (ref 30.0–36.0)
MCV: 84.1 fL (ref 80.0–100.0)
Platelets: 263 10*3/uL (ref 150–400)
RBC: 2.2 MIL/uL — ABNORMAL LOW (ref 4.22–5.81)
RDW: 15.9 % — ABNORMAL HIGH (ref 11.5–15.5)
WBC: 8.9 10*3/uL (ref 4.0–10.5)
nRBC: 2.4 % — ABNORMAL HIGH (ref 0.0–0.2)

## 2022-10-25 LAB — GLUCOSE, CAPILLARY
Glucose-Capillary: 137 mg/dL — ABNORMAL HIGH (ref 70–99)
Glucose-Capillary: 142 mg/dL — ABNORMAL HIGH (ref 70–99)
Glucose-Capillary: 145 mg/dL — ABNORMAL HIGH (ref 70–99)
Glucose-Capillary: 156 mg/dL — ABNORMAL HIGH (ref 70–99)
Glucose-Capillary: 165 mg/dL — ABNORMAL HIGH (ref 70–99)
Glucose-Capillary: 188 mg/dL — ABNORMAL HIGH (ref 70–99)
Glucose-Capillary: 195 mg/dL — ABNORMAL HIGH (ref 70–99)

## 2022-10-25 LAB — BASIC METABOLIC PANEL
Anion gap: 9 (ref 5–15)
BUN: 26 mg/dL — ABNORMAL HIGH (ref 6–20)
CO2: 24 mmol/L (ref 22–32)
Calcium: 7.9 mg/dL — ABNORMAL LOW (ref 8.9–10.3)
Chloride: 102 mmol/L (ref 98–111)
Creatinine, Ser: 0.85 mg/dL (ref 0.61–1.24)
GFR, Estimated: >60 mL/min (ref >60)
Glucose, Bld: 193 mg/dL — ABNORMAL HIGH (ref 70–99)
Potassium: 4.2 mmol/L (ref 3.5–5.1)
Sodium: 135 mmol/L (ref 135–145)

## 2022-10-25 LAB — HEMOGLOBIN AND HEMATOCRIT, BLOOD
HCT: 20.1 % — ABNORMAL LOW (ref 39.0–52.0)
Hemoglobin: 6.4 g/dL — ABNORMAL LOW (ref 13.0–17.0)

## 2022-10-25 LAB — PREPARE RBC (CROSSMATCH)

## 2022-10-25 MED ORDER — HYDROMORPHONE HCL 1 MG/ML IJ SOLN
1.0000 mg | INTRAMUSCULAR | Status: DC | PRN
  Administered 2022-10-25 – 2022-11-06 (×42): 1 mg via INTRAVENOUS
  Filled 2022-10-25 (×44): qty 1

## 2022-10-25 MED ORDER — IPRATROPIUM-ALBUTEROL 0.5-2.5 (3) MG/3ML IN SOLN
3.0000 mL | Freq: Four times a day (QID) | RESPIRATORY_TRACT | Status: DC | PRN
  Administered 2022-10-25 – 2022-10-27 (×2): 3 mL via RESPIRATORY_TRACT
  Filled 2022-10-25: qty 3

## 2022-10-25 MED ORDER — SODIUM CHLORIDE 0.9% IV SOLUTION: Freq: Once | INTRAVENOUS | Status: AC

## 2022-10-25 MED ORDER — GUAIFENESIN 100 MG/5ML PO LIQD
15.0000 mL | ORAL | Status: DC | PRN
  Administered 2022-10-25 – 2022-10-28 (×5): 15 mL via ORAL
  Filled 2022-10-25 (×5): qty 20

## 2022-10-25 NOTE — Progress Notes (Signed)
PROGRESS NOTE    Russell [name]  QQV:[id number] DOB: 1965/06/03 DOA: 10/23/2022 PCP: [name], MD   Assessment & Plan:   Principal Problem:   Acute blood loss anemia Active Problems:   Dyslipidemia   Type 2 diabetes mellitus with peripheral neuropathy (HCC)   Anxiety and depression   Essential hypertension   Acute gastric ulcer with hemorrhage  Assessment and Plan:  Acute blood loss anemia: likely secondary to recurrent upper GI bleed. Hx of gastric ulcers. Continue on IV PPI. Holding home aspirin, plavix. S/p 4 units of pRBCs transfuse and H&H at 5.7&18.5. Will transfuse another 2 units of pRBCs today. Repeat H&H ordered   Likely upper GI bleed: likely secondary to gastric ulcers. Pt went home and took [facility] powder b/c he "couldn't get to the pharmacy to pick up the other pain med he was prescribed". Still having bloody stools. EGD is on hold until pt's Hb 7.0 as per GI. GI following and recs apprec  Hyponatremia: resolved    HLD: continue on statin    DM2: likely poorly controlled. Continue on levemir, SSI w/ accuchecks   Hx of right BKA: continue on supportive care   Peripheral neuropathy: continue on home dose of pregabalin   Depression: severity unknown. Continue on home dose of duloxetine. Holding nortriptyline   Morbid obesity: BMI 52.2. Complicates overall care & prognosis     DVT prophylaxis: SCDs Code Status: full  Family Communication:  Disposition Plan: depends on PT/OT recs (not consulted yet)   Level of care: Progressive Status is: Inpatient Remains inpatient appropriate because: severity of illness     Consultants:  GI   Procedures:   Antimicrobials:   Subjective: Pt c/o leg and back pain   Objective: Vitals:   10/24/22 1950 10/24/22 2346 10/25/22 0815 10/25/22 0845  BP: (!) 127/45 (!) 128/45  (!) 104/32  Pulse: 86 86  89  Resp: 18   20  Temp: 97.7 F (36.5 C) 97.9 F (36.6 C)  99.2 F (37.3 C)  TempSrc: Oral Oral     SpO2: 99% 100% 100% 100%  Weight:      Height:        Intake/Output Summary (Last 24 hours) at 10/25/2022 0850 Last data filed at 10/25/2022 0600 Gross per 24 hour  Intake 1560.23 ml  Output --  Net 1560.23 ml   Filed Weights   10/23/22 2209  Weight: (!) 170 kg    Examination:  General exam: appears uncomfortable. Morbidly obese Respiratory system: diminished breath sounds b/l  Cardiovascular system: S1/S2+. No rubs or clicks  Gastrointestinal system: abd is soft, NT, obese & hypoactive bowel sounds  Central nervous system: alert & oriented. Moves all extremities Psychiatry: judgement and insight appears at baseline. Flat mood and affect    Data Reviewed: I have personally reviewed following labs and imaging studies  CBC: Recent Labs  Lab 10/23/22 2241 10/24/22 0635 10/24/22 1257 10/24/22 2213 10/25/22 0745  WBC 8.6 8.1  --   --  8.9  NEUTROABS 6.5  --   --   --   --   HGB 3.9* 4.4* 5.2* 5.6* 5.7*  HCT 13.0* 14.4* 16.4* 17.5* 18.5*  MCV 86.1 85.7  --   --  84.1  PLT 265 262  --   --  263   Basic Metabolic Panel: Recent Labs  Lab 10/23/22 2241 10/24/22 0635 10/25/22 0611  NA 132* 129* 135  K 5.0 4.4 4.2  CL 98 98 102  CO2 26  27 24  GLUCOSE 313* 321* 193*  BUN 51* 42* 26*  CREATININE 1.12 1.08 0.85  CALCIUM 8.0* 7.6* 7.9*  MG 2.5*  --   --    GFR: Estimated Creatinine Clearance: 151.7 mL/min (by C-G formula based on SCr of 0.85 mg/dL). Liver Function Tests: Recent Labs  Lab 10/23/22 2241  AST 14*  ALT 14  ALKPHOS 73  BILITOT 0.5  PROT 5.3*  ALBUMIN 2.5*   No results for input(s): "LIPASE", "AMYLASE" in the last 168 hours. No results for input(s): "AMMONIA" in the last 168 hours. Coagulation Profile: No results for input(s): "INR", "PROTIME" in the last 168 hours. Cardiac Enzymes: No results for input(s): "CKTOTAL", "CKMB", "CKMBINDEX", "TROPONINI" in the last 168 hours. BNP (last 3 results) No results for input(s): "PROBNP" in the last  8760 hours. HbA1C: No results for input(s): "HGBA1C" in the last 72 hours. CBG: Recent Labs  Lab 10/24/22 1551 10/24/22 1946 10/24/22 2331 10/25/22 0552 10/25/22 0845  GLUCAP 309* 366* 347* 188* 156*   Lipid Profile: No results for input(s): "CHOL", "HDL", "LDLCALC", "TRIG", "CHOLHDL", "LDLDIRECT" in the last 72 hours. Thyroid Function Tests: No results for input(s): "TSH", "T4TOTAL", "FREET4", "T3FREE", "THYROIDAB" in the last 72 hours. Anemia Panel: No results for input(s): "VITAMINB12", "FOLATE", "FERRITIN", "TIBC", "IRON", "RETICCTPCT" in the last 72 hours. Sepsis Labs: No results for input(s): "PROCALCITON", "LATICACIDVEN" in the last 168 hours.  Recent Results (from the past 240 hour(s))  Resp panel by RT-PCR (RSV, Flu A&B, Covid) Anterior Nasal Swab     Status: None   Collection Time: 10/23/22 10:41 PM   Specimen: Anterior Nasal Swab  Result Value Ref Range Status   SARS Coronavirus 2 by RT PCR NEGATIVE NEGATIVE Final    Comment: (NOTE) SARS-CoV-2 target nucleic acids are NOT DETECTED.  The SARS-CoV-2 RNA is generally detectable in upper respiratory specimens during the acute phase of infection. The lowest concentration of SARS-CoV-2 viral copies this assay can detect is 138 copies/mL. A negative result does not preclude SARS-Cov-2 infection and should not be used as the sole basis for treatment or other patient management decisions. A negative result may occur with  improper specimen collection/handling, submission of specimen other than nasopharyngeal swab, presence of viral mutation(s) within the areas targeted by this assay, and inadequate number of viral copies(<138 copies/mL). A negative result must be combined with clinical observations, patient history, and epidemiological information. The expected result is Negative.  Fact Sheet for Patients:  BloggerCourse.com  Fact Sheet for Healthcare Providers:   SeriousBroker.it  This test is no t yet approved or cleared by the [place] FDA and  has been authorized for detection and/or diagnosis of SARS-CoV-2 by FDA under an Emergency Use Authorization (EUA). This EUA will remain  in effect (meaning this test can be used) for the duration of the COVID-19 declaration under Section 564(b)(1) of the Act, 21 U.S.C.section 360bbb-3(b)(1), unless the authorization is terminated  or revoked sooner.       Influenza A by PCR NEGATIVE NEGATIVE Final   Influenza B by PCR NEGATIVE NEGATIVE Final    Comment: (NOTE) The Xpert Xpress SARS-CoV-2/FLU/RSV plus assay is intended as an aid in the diagnosis of influenza from Nasopharyngeal swab specimens and should not be used as a sole basis for treatment. Nasal washings and aspirates are unacceptable for Xpert Xpress SARS-CoV-2/FLU/RSV testing.  Fact Sheet for Patients: BloggerCourse.com  Fact Sheet for Healthcare Providers: SeriousBroker.it  This test is not yet approved or cleared by the [place]  FDA and has been authorized for detection and/or diagnosis of SARS-CoV-2 by FDA under an Emergency Use Authorization (EUA). This EUA will remain in effect (meaning this test can be used) for the duration of the COVID-19 declaration under Section 564(b)(1) of the Act, 21 U.S.C. section 360bbb-3(b)(1), unless the authorization is terminated or revoked.     Resp Syncytial Virus by PCR NEGATIVE NEGATIVE Final    Comment: (NOTE) Fact Sheet for Patients: BloggerCourse.com  Fact Sheet for Healthcare Providers: SeriousBroker.it  This test is not yet approved or cleared by the [place] FDA and has been authorized for detection and/or diagnosis of SARS-CoV-2 by FDA under an Emergency Use Authorization (EUA). This EUA will remain in effect (meaning this test can be used) for  the duration of the COVID-19 declaration under Section 564(b)(1) of the Act, 21 U.S.C. section 360bbb-3(b)(1), unless the authorization is terminated or revoked.  Performed at [facility], [address], [place], [place] [address]          Radiology Studies: CT ABDOMEN PELVIS W CONTRAST  Result Date: 10/24/2022 CLINICAL DATA:  Blood loss. EXAM: CT ABDOMEN AND PELVIS WITH CONTRAST TECHNIQUE: Multidetector CT imaging of the abdomen and pelvis was performed using the standard protocol following bolus administration of intravenous contrast. RADIATION DOSE REDUCTION: This exam was performed according to the departmental dose-optimization program which includes automated exposure control, adjustment of the mA and/or kV according to patient size and/or use of iterative reconstruction technique. CONTRAST:  OMNIPAQUE IOHEXOL 300 MG/ML  SOLN COMPARISON:  CT 10/12/2022. FINDINGS: Lower chest: There is some linear opacity lung bases likely scar or atelectasis. No pleural effusion. Coronary artery calcifications are seen. There are some prominent nodes identified along the mediastinum at the edge of the imaging field, unchanged from previous. Stable nodular area of thickening along the subcutaneous fat along the anterior aspect of the lower chest wall right of midline on series 2, image 20 Hepatobiliary: No focal liver abnormality is seen. No gallstones, gallbladder wall thickening, or biliary dilatation. Patent portal vein. Pancreas: Severe atrophy of the pancreas. Spleen: Normal in size without focal abnormality. Adrenals/Urinary Tract: Adrenal glands are preserved. No enhancing renal mass or collecting system dilatation. The ureters have normal course and caliber down to the bladder. The right kidney is malrotated, a congenital variant distended urinary bladder Stomach/Bowel: No oral contrast. The large bowel has a normal course and caliber with some scattered stool. Appendix is not seen.  There is some surgical changes along the posterior aspect of the cecum. The stomach and small bowel are nondilated. Vascular/Lymphatic: Normal caliber aorta and IVC with mild vascular calcifications. There are some small retroperitoneal nodes identified, nonpathologic by size criteria. Similar nodes along the pelvic sidewall regions. These are unchanged from previous. Reproductive: Prostate is unremarkable. Other: No retroperitoneal hematoma. No ascites or fluid collection. There is anasarca. Additional skin thickening with stranding along the anterior pelvic wall the level of the perineum, increasing from the prior examination. Please correlate with clinical findings such as cellulitis. Small fat containing umbilical hernia. No soft tissue gas seen. Musculoskeletal: Moderate degenerative changes along the spine with some disc bulging and osteophytes. Degenerative changes along the pelvis. IMPRESSION: No retroperitoneal hematoma identified.  No ascites or free air. No bowel obstruction. Stable subcutaneous area of thickening and nodularity along the anterior abdominal, lower chest wall right of midline. Increasing skin thickening with some stranding along the anterior low pelvis, perineal region. Please correlate with clinical findings. Electronically Signed   By:  Karen Kays M.D.   On: 10/24/2022 14:54   DG Chest Portable 1 View  Result Date: 10/23/2022 CLINICAL DATA:  Shortness of breath and chest pain.  History of CHF. EXAM: PORTABLE CHEST 1 VIEW COMPARISON:  Most recent radiograph 10/12/2022 FINDINGS: Cardiomegaly is stable. Vascular congestion is unchanged. There is improving left lung base opacity. No new airspace disease. No large pleural effusion on this AP view. No pneumothorax. IMPRESSION: 1. Stable cardiomegaly and vascular congestion. 2. Improving left lung base opacity. Electronically Signed   By: Narda Rutherford M.D.   On: 10/23/2022 23:17        Scheduled Meds:  sodium chloride    Intravenous Once   sodium chloride   Intravenous Once   sodium chloride   Intravenous Once   atorvastatin  40 mg Oral Daily   DULoxetine  30 mg Oral Daily   insulin aspart  0-9 Units Subcutaneous Q4H   insulin detemir  42 Units Subcutaneous BID   loratadine  10 mg Oral Daily   pregabalin  150 mg Oral TID   tamsulosin  0.4 mg Oral QPC breakfast   Continuous Infusions:  sodium chloride     pantoprazole 8 mg/hr (10/25/22 0242)     LOS: 1 day    Time spent: 35 mins    Charise Killian, MD [facility] Pager [phone]  If 7PM-7AM, please contact night-coverage [email or web address] 10/25/2022, 8:50 AM

## 2022-10-25 NOTE — TOC Initial Note (Signed)
Transition of Care [facility]) - Initial/Assessment Note    Patient Details  Name: Russell [name] MRN: [id number] Date of Birth: 17-Dec-1965  Transition of Care [facility]) CM/SW Contact:    Truddie Hidden, RN Phone Number: 10/25/2022, 4:02 PM  Clinical Narrative:                 Admitted for:GIB for ulcers Admitted from:home PCP: [name], MD [facility]acy[facility]" Current home health/prior home health/DME:electric wc, r prosthetic leg, walker.  Patient uncooperative when speaking with him. He is requesting LTC. RNCM attempted to speak with patient about criteria for LTC vs STR and  needed payor. Patient stated he was no longer going to discuss it.     Expected Discharge Plan: Home/Self Care Barriers to Discharge: Continued Medical Work up   Patient Goals and CMS Choice Patient states their goals for this hospitalization and ongoing recovery are:: Return to home          Expected Discharge Plan and Services       Living arrangements for the past 2 months: Single Family Home                                      Prior Living Arrangements/Services Living arrangements for the past 2 months: Single Family Home Lives with:: Self Patient language and need for interpreter reviewed:: Yes Do you feel safe going back to the place where you live?: Yes      Need for Family Participation in Patient Care: Yes (Comment) Care giver support system in place?: No (comment)   Criminal Activity/Legal Involvement Pertinent to Current Situation/Hospitalization: No - Comment as needed  Activities of Daily Living Home Assistive Devices/Equipment: None ADL Screening (condition at time of admission) Patient's cognitive ability adequate to safely complete daily activities?: Yes Is the patient deaf or have difficulty hearing?: No Does the patient have difficulty seeing, even when wearing glasses/contacts?: No Does the patient have difficulty concentrating, remembering, or making  decisions?: No Patient able to express need for assistance with ADLs?: Yes Does the patient have difficulty dressing or bathing?: Yes Independently performs ADLs?: Yes (appropriate for developmental age) Does the patient have difficulty walking or climbing stairs?: Yes Weakness of Legs: None Weakness of Arms/Hands: None  Permission Sought/Granted                  Emotional Assessment Appearance:: Appears older than stated age Attitude/Demeanor/Rapport: Complaining, Uncooperative Affect (typically observed): Irritable Orientation: : Oriented to Self, Oriented to Place, Oriented to  Time, Oriented to Situation Alcohol / Substance Use: Not Applicable Psych Involvement: No (comment)  Admission diagnosis:  Shortness of breath [R06.02] Acute blood loss anemia [D62] Anemia requiring transfusions [D64.9] UGIB (upper gastrointestinal bleed) [K92.2] Acute gastritis, presence of bleeding unspecified, unspecified gastritis type [K29.00] Patient Active Problem List   Diagnosis Date Noted   Dyslipidemia 10/24/2022   Anxiety and depression 10/24/2022   Essential hypertension 10/24/2022   Type 2 diabetes mellitus with peripheral neuropathy (HCC) 10/24/2022   Acute gastric ulcer with hemorrhage 10/24/2022   Acute urinary retention 10/17/2022   Acute respiratory failure with hypoxia (HCC) 10/15/2022   Multiple gastric ulcers 10/14/2022   Hyperosmolar hyperglycemic state (HHS) (HCC) 10/13/2022   HLD (hyperlipidemia) 10/13/2022   Acute on chronic diastolic CHF (congestive heart failure) (HCC) 10/13/2022   Depression with anxiety 10/13/2022   Hyperkalemia 10/13/2022   Elevated lactic acid level 10/13/2022  Rectal bleeding 10/13/2022   Acute blood loss anemia 10/13/2022   Wound infection_ left lower leg 10/13/2022   Upper GI bleed 10/13/2022   Hypovolemic shock (HCC) 10/13/2022   Epigastric abdominal pain 10/13/2022   Sciatica of right side 10/13/2022   AKI (acute kidney injury) (HCC)  10/13/2022   Hypotension 11/21/2020   Atrial flutter with rapid ventricular response (HCC) 11/21/2020   Chronic kidney disease, stage 4, severely decreased GFR (HCC) 06/14/2018   Chronic venous insufficiency 06/14/2018   Cellulitis of left lower extremity 02/01/2018   Cellulitis 05/18/2017   Hyperlipidemia, mixed 01/10/2017   Cellulitis and abscess of left leg 08/08/2016   Diabetes with ulcer of leg (HCC) 08/08/2016   Hyperglycemia 08/08/2016   Morbid obesity (HCC) 08/08/2016   Cellulitis and abscess of leg 08/08/2016   Adjustment disorder with depressed mood 11/21/2014   Other social stressor 11/21/2014   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 [place]) [place] [address] Phone: [phone] Fax: [phone]  [id number] [place], [place] - [address] AT [facility] [address] [place] [address] Phone: [phone] Fax: [phone]     Social Determinants of Health (SDOH) Social History: SDOH Screenings   Food Insecurity: No Food Insecurity (10/24/2022)  Housing: Low Risk  (10/24/2022)  Transportation Needs: No Transportation Needs (10/24/2022)  Utilities: Not At Risk (10/24/2022)  Financial Resource Strain: Medium Risk (06/17/2022)   Received from [facility]  Tobacco Use: Low Risk  (10/23/2022)   SDOH Interventions:     Readmission Risk Interventions    10/25/2022    3:55 PM  Readmission Risk Prevention Plan  Transportation Screening Complete  PCP or Specialist Appt within 3-5 Days Complete  HRI or Home Care Consult Complete  Social Work Consult for Recovery Care Planning/Counseling Complete  Palliative Care Screening Not Applicable  Medication Review Oceanographer) Complete

## 2022-10-25 NOTE — Progress Notes (Signed)
GI Inpatient Follow-up Note  Subjective:  Patient seen and hemoglobin not greater than 7 so unable to do EGD. Clinically he is much improved from yesterday. Report of small melenic stool this morning.  Scheduled Inpatient Medications:   sodium chloride   Intravenous Once   sodium chloride   Intravenous Once   sodium chloride   Intravenous Once   atorvastatin  40 mg Oral Daily   DULoxetine  30 mg Oral Daily   insulin aspart  0-9 Units Subcutaneous Q4H   insulin detemir  42 Units Subcutaneous BID   loratadine  10 mg Oral Daily   pregabalin  150 mg Oral TID   tamsulosin  0.4 mg Oral QPC breakfast    Continuous Inpatient Infusions:    sodium chloride     pantoprazole Stopped (10/25/22 0947)    PRN Inpatient Medications:  acetaminophen **OR** acetaminophen, guaiFENesin, HYDROmorphone (DILAUDID) injection, ipratropium-albuterol, ondansetron **OR** ondansetron (ZOFRAN) IV, oxyCODONE-acetaminophen, potassium chloride SA, senna-docusate, traZODone  Review of Systems:  Review of Systems  Constitutional:  Negative for chills and fever.  Respiratory:  Negative for shortness of breath.   Cardiovascular:  Negative for chest pain.  Gastrointestinal:  Positive for melena. Negative for abdominal pain, diarrhea, nausea and vomiting.  Musculoskeletal:  Positive for joint pain.  Skin:  Negative for rash.  Neurological:  Negative for focal weakness.  Psychiatric/Behavioral:  Negative for substance abuse.   All other systems reviewed and are negative.     Physical Examination: BP (!) 105/50 (BP Location: Left Arm)   Pulse 85   Temp 99 F (37.2 C)   Resp 20   Ht 5\' 11"  (1.803 m)   Wt (!) 170 kg   SpO2 94%   BMI 52.27 kg/m  Gen: NAD, alert and oriented x 4 HEENT: PEERLA, EOMI, Neck: supple, no JVD or thyromegaly Chest: No respiratory distress Abd: soft, non-tender, non-distended Ext: no edema, well perfused with 2+ pulses, Skin: no rash or lesions noted Lymph: no  LAD  Data: Lab Results  Component Value Date   WBC 8.9 10/25/2022   HGB 5.7 (L) 10/25/2022   HCT 18.5 (L) 10/25/2022   MCV 84.1 10/25/2022   PLT 263 10/25/2022   Recent Labs  Lab 10/24/22 1257 10/24/22 2213 10/25/22 0745  HGB 5.2* 5.6* 5.7*   Lab Results  Component Value Date   NA 135 10/25/2022   K 4.2 10/25/2022   CL 102 10/25/2022   CO2 24 10/25/2022   BUN 26 (H) 10/25/2022   CREATININE 0.85 10/25/2022   Lab Results  Component Value Date   ALT 14 10/23/2022   AST 14 (L) 10/23/2022   ALKPHOS 73 10/23/2022   BILITOT 0.5 10/23/2022   No results for input(s): "APTT", "INR", "PTT" in the last 168 hours. Assessment/Plan: Mr. Melon is a 57 y.o. gentleman with history of morbid obesity, PVD, Hypertension, DM II, and HFpEF here with severe anemia with small amount of melenic stool. His ulcers did not have any high risk stigmata of bleeding and colonoscopy without any obvious lesions or any blood. Pending resuscitation for repeat EGD  Recommendations:  - maintain two large bore IV's - transfuse to keep hemoglobin > 7, may need diuresis between transfusions given his history of HFpEF - ok for clears today, NPO at midnight for possible EGD tomorrow if clinically stable hemoglobin is > 7 - IV PPI BID - correct electrolytes - clinically improved   Will continue to follow, please call with any questions or concerns.  Merlyn Lot  MD, MPH [facility] GI

## 2022-10-25 NOTE — Plan of Care (Signed)

## 2022-10-26 ENCOUNTER — Inpatient Hospital Stay: Payer: 59 | Admitting: Certified Registered"

## 2022-10-26 ENCOUNTER — Encounter
Admission: EM | Disposition: A | Discharge status: Home Health | Payer: Self-pay | Source: Home / Self Care | Attending: Internal Medicine

## 2022-10-26 ENCOUNTER — Encounter: Payer: Self-pay | Admitting: Internal Medicine

## 2022-10-26 DIAGNOSIS — K264 Chronic or unspecified duodenal ulcer with hemorrhage: Secondary | ICD-10-CM

## 2022-10-26 DIAGNOSIS — D62 Acute posthemorrhagic anemia: Secondary | ICD-10-CM | POA: Diagnosis not present

## 2022-10-26 HISTORY — PX: ESOPHAGOGASTRODUODENOSCOPY (EGD) WITH PROPOFOL: SHX5813

## 2022-10-26 LAB — CBC
HCT: 20.7 % — ABNORMAL LOW (ref 39.0–52.0)
Hemoglobin: 6.5 g/dL — ABNORMAL LOW (ref 13.0–17.0)
MCH: 26.6 pg (ref 26.0–34.0)
MCHC: 31.4 g/dL (ref 30.0–36.0)
MCV: 84.8 fL (ref 80.0–100.0)
Platelets: 231 10*3/uL (ref 150–400)
RBC: 2.44 MIL/uL — ABNORMAL LOW (ref 4.22–5.81)
RDW: 15.1 % (ref 11.5–15.5)
WBC: 8.5 10*3/uL (ref 4.0–10.5)
nRBC: 1.2 % — ABNORMAL HIGH (ref 0.0–0.2)

## 2022-10-26 LAB — GLUCOSE, CAPILLARY
Glucose-Capillary: 124 mg/dL — ABNORMAL HIGH (ref 70–99)
Glucose-Capillary: 135 mg/dL — ABNORMAL HIGH (ref 70–99)
Glucose-Capillary: 155 mg/dL — ABNORMAL HIGH (ref 70–99)
Glucose-Capillary: 140 mg/dL — ABNORMAL HIGH (ref 70–99)
Glucose-Capillary: 158 mg/dL — ABNORMAL HIGH (ref 70–99)
Glucose-Capillary: 161 mg/dL — ABNORMAL HIGH (ref 70–99)
Glucose-Capillary: 150 mg/dL — ABNORMAL HIGH (ref 70–99)
Glucose-Capillary: 189 mg/dL — ABNORMAL HIGH (ref 70–99)

## 2022-10-26 LAB — BASIC METABOLIC PANEL
Anion gap: 7 (ref 5–15)
BUN: 17 mg/dL (ref 6–20)
CO2: 28 mmol/L (ref 22–32)
Calcium: 7.9 mg/dL — ABNORMAL LOW (ref 8.9–10.3)
Chloride: 101 mmol/L (ref 98–111)
Creatinine, Ser: 0.76 mg/dL (ref 0.61–1.24)
GFR, Estimated: >60 mL/min (ref >60)
Glucose, Bld: 141 mg/dL — ABNORMAL HIGH (ref 70–99)
Potassium: 3.7 mmol/L (ref 3.5–5.1)
Sodium: 136 mmol/L (ref 135–145)

## 2022-10-26 LAB — HEMOGLOBIN AND HEMATOCRIT, BLOOD
HCT: 22.2 % — ABNORMAL LOW (ref 39.0–52.0)
Hemoglobin: 7.2 g/dL — ABNORMAL LOW (ref 13.0–17.0)

## 2022-10-26 LAB — PREPARE RBC (CROSSMATCH)

## 2022-10-26 SURGERY — ESOPHAGOGASTRODUODENOSCOPY (EGD) WITH PROPOFOL
Anesthesia: General

## 2022-10-26 MED ORDER — PROPOFOL 10 MG/ML IV BOLUS
INTRAVENOUS | Status: DC | PRN
Start: 2022-10-26 — End: 2022-10-26
  Administered 2022-10-26: 20 mg via INTRAVENOUS
  Administered 2022-10-26: 30 mg via INTRAVENOUS

## 2022-10-26 MED ORDER — PANTOPRAZOLE SODIUM 40 MG PO TBEC
40.0000 mg | DELAYED_RELEASE_TABLET | Freq: Two times a day (BID) | ORAL | Status: DC
  Administered 2022-10-26 – 2022-11-08 (×27): 40 mg via ORAL
  Filled 2022-10-26 (×27): qty 1

## 2022-10-26 MED ORDER — SODIUM CHLORIDE 0.9% FLUSH
10.0000 mL | Freq: Two times a day (BID) | INTRAVENOUS | Status: DC
  Administered 2022-10-26 – 2022-11-08 (×25): 10 mL

## 2022-10-26 MED ORDER — SODIUM CHLORIDE 0.9% IV SOLUTION: Freq: Once | INTRAVENOUS | Status: AC

## 2022-10-26 MED ORDER — LIDOCAINE HCL (CARDIAC) PF 100 MG/5ML IV SOSY
PREFILLED_SYRINGE | INTRAVENOUS | Status: DC | PRN
  Administered 2022-10-26: 100 mg via INTRAVENOUS
  Administered 2022-10-26: 200 mg via INTRAVENOUS

## 2022-10-26 MED ORDER — SODIUM CHLORIDE 0.9% FLUSH: 10.0000 mL | INTRAVENOUS | Status: DC | PRN

## 2022-10-26 NOTE — Transfer of Care (Signed)
Immediate Anesthesia Transfer of Care Note  Patient: Russell [name]  Procedure(s) Performed: ESOPHAGOGASTRODUODENOSCOPY (EGD) WITH PROPOFOL  Patient Location: PACU  Anesthesia Type:General  Level of Consciousness: awake, alert , and oriented  Airway & Oxygen Therapy: Patient Spontanous Breathing  Post-op Assessment: Report given to RN and Post -op Vital signs reviewed and stable  Post vital signs: stable  Last Vitals:  Vitals Value Taken Time  BP 112/47 10/26/22 1315  Temp    Pulse 82 10/26/22 1315  Resp 9 10/26/22 1315  SpO2 97 % 10/26/22 1315  Vitals shown include unfiled device data.  Last Pain:  Vitals:   10/26/22 1253  TempSrc: Temporal  PainSc: 0-No pain      Patients Stated Pain Goal: 0 (10/25/22 2346)  Complications: No notable events documented.

## 2022-10-26 NOTE — Anesthesia Preprocedure Evaluation (Signed)
Anesthesia Evaluation  Patient identified by MRN, date of birth, ID band Patient awake    Reviewed: Allergy & Precautions, NPO status , Patient's Chart, lab work & pertinent test results  History of Anesthesia Complications Negative for: history of anesthetic complications  Airway Mallampati: III  TM Distance: >3 FB Neck ROM: full    Dental  (+) Poor Dentition, Dental Advidsory Given,    Pulmonary shortness of breath, neg COPD, neg recent URI Acute Respiratory Failure 3L [place] Possible fluid overload CXR: IMPRESSION: 1. Cardiomegaly and moderate pulmonary vascular congestion. 2. Mild left basilar airspace disease likely reflects atelectasis. Infection is not excluded.   [place] in place with slightly blood tinged nares   + decreased breath sounds      Cardiovascular Exercise Tolerance: Good hypertension, On Medications (-) angina +CHF (dCHF)  (-) Past MI and (-) Cardiac Stents + dysrhythmias Atrial Fibrillation (-) Valvular Problems/Murmurs Rhythm:Regular Rate:Normal + Peripheral Edema    Neuro/Psych  PSYCHIATRIC DISORDERS Anxiety Depression     Neuromuscular disease (R sciatica)    GI/Hepatic Neg liver ROS, PUD,,,Lower GIB- Melena - resolved currently   Endo/Other  diabetes, Type 2  Morbid obesity  Renal/GU Renal disease  negative genitourinary   Musculoskeletal   Abdominal  (+) + obese  Peds  Hematology  (+) Blood dyscrasia, anemia   Anesthesia Other Findings Pt in ICU  recovering from Hyperosmolar hyperglycemic state, Hypovolemic shock, pRBC administration with diuresis.   right BKA  Past Medical History: No date: Anxiety No date: CHF (congestive heart failure) (HCC)     Comment:  EF 30-35% No date: Chronic diastolic (congestive) heart failure (HCC) No date: Depression No date: Diabetes mellitus without complication (HCC) No date: Hyperlipemia No date: Hypertension No date: Morbid obesity (HCC) No date:  Peripheral edema  Past Surgical History: No date: APPENDECTOMY No date: BELOW KNEE LEG AMPUTATION     Comment:  Right leg No date: TONSILLECTOMY  BMI    Body Mass Index: 50.27 kg/m      Reproductive/Obstetrics negative OB ROS                             Anesthesia Physical Anesthesia Plan  ASA: 4  Anesthesia Plan: General   Post-op Pain Management: Minimal or no pain anticipated   Induction: Intravenous  PONV Risk Score and Plan: 1 and TIVA and Propofol infusion  Airway Management Planned: Natural Airway and Nasal Cannula  Additional Equipment:   Intra-op Plan:   Post-operative Plan:   Informed Consent: I have reviewed the patients History and Physical, chart, labs and discussed the procedure including the risks, benefits and alternatives for the proposed anesthesia with the patient or authorized representative who has indicated his/her understanding and acceptance.     Dental advisory given  Plan Discussed with: Anesthesiologist and CRNA  Anesthesia Plan Comments:        Anesthesia Quick Evaluation

## 2022-10-26 NOTE — Plan of Care (Signed)

## 2022-10-26 NOTE — Progress Notes (Signed)
PROGRESS NOTE   HPI was taken from Dr. Arville Care: Russell [name] is a 57 y.o. Caucasian male with medical history significant for combined systolic and diastolic CHF, right BKA, anxiety and depression, type 2 diabetes mellitus, hypertension, dyslipidemia and morbid obesity, who presented to the emergency room with acute onset of dyspnea.  The patient was just admitted from 8/13 till 8/24 acute blood loss anemia with associated upper GI bleeding and hypovolemic shock.  His EGD showed gastritis and nonbleeding gastric ulcers.  His colonoscopy showed no active bleeding.  He received a total of 4 units of PRBCs during his hospitalization as well as IV iron.  His hemoglobin was 8.3 on 8/18.  He required a brief IV Levophed for his hypovolemic shock.  He was placed on IV insulin drip for hyperosmolar hyperglycemic state.  He currently denies any nausea or vomiting or abdominal pain or heartburn.  He did not notice any melena or bright red bleeding per rectum.  No dysuria, oliguria or hematuria or flank pain.  No chest pain or palpitations.  No cough or wheezing or hemoptysis.  No other bleeding diathesis.  He is stated that this dyspnea started from the night he was discharged home.  He took 2 aspirin that night for headache.  He has not taken any more aspirin.  He denied taking Eliquis.     ED Course: When he came to the ER, BP was 103/41 with otherwise normal vital signs.  Labs revealed hemoglobin of 3.9 hematocrit 13 with MCH of 25.8.  High sensitive troponin was 6 and later 7.  BNP was 190.6.  CMP showed sodium 132 potassium 5, glucose 313 and BUN 51 with a creatinine 1.12.  Magnesium was 2.5 and albumin 2.5 with total protein of 5.3.  Beta-hydroxybutyrate was normal at 0.21. EKG as reviewed by me : EKG showed accelerated junctional rhythm with a rate of 72 with nonspecific interventricular conduction delay. Imaging: Portable chest x-ray showed stable cardiomegaly and vascular congestion and improved left lung  base opacity.   The patient was typed and crossmatch and will be transfused 2 units of packed red blood cells.  He was given 4 mg of IV morphine sulfate, 80 mg of IV Protonix bolus followed by IV drip.  He will be admitted to an observation progressive unit bed for further evaluation and management.   Russell [name]  QMV:[id number] DOB: 06-01-1965 DOA: 10/23/2022 PCP: [name], MD   Assessment & Plan:   Principal Problem:   Acute blood loss anemia Active Problems:   Dyslipidemia   Type 2 diabetes mellitus with peripheral neuropathy (HCC)   Anxiety and depression   Essential hypertension   Acute gastric ulcer with hemorrhage  Assessment and Plan:  Acute blood loss anemia: likely secondary to recurrent upper GI bleed. Hx of gastric ulcers. Continue on PPI BID. Holding home aspirin, plavix. No NSAIDs. S/p 6 units of pRBCs transfused so far. Will give 1 unit more of pRBCs today. Repeat H&H ordered.   Likely upper GI bleed: likely secondary to duodenal ulcers. Pt went home and took [facility] powder b/c he "couldn't get to the pharmacy to pick up the other pain med he was prescribed". S/p EGD which showed non bleeding duodenal ulcers w/ a clean ulcer base as per GI 10/26/22  Hyponatremia: resolved    HLD: continue on statin    DM2: likely poorly controlled. Continue on levemir, SSI w/ accuchecks   Hx of right BKA: continue w/ supportive care  Peripheral neuropathy: continue on home dose of pregabalin   Depression: severity unknown. Continue on home dose of duloxetine. Holding nortriptyline   Morbid obesity: BMI 52.2. Complicates overall care & prognosis   Generalized weakness: PT/OT consulted. Pt wants to go to SNF as he feels he cannot take care of himself. Would benefit from meds to beds at d/c    DVT prophylaxis: SCDs Code Status: full  Family Communication:  Disposition Plan: depends on PT/OT recs  Level of care: Progressive Status is: Inpatient Remains inpatient  appropriate because: still requiring pRBCs transfusions     Consultants:  GI   Procedures:   Antimicrobials:   Subjective: Pt c/o malaise   Objective: Vitals:   10/25/22 1932 10/25/22 2335 10/26/22 0452 10/26/22 0823  BP: (!) 124/57 (!) 111/56 (!) 144/59 (!) 118/56  Pulse: 81 83 89 81  Resp: 20  20 18   Temp: 98.5 F (36.9 C) 98.3 F (36.8 C) 98.1 F (36.7 C) 98 F (36.7 C)  TempSrc: Oral Oral Oral Oral  SpO2: 97% 98% 100% 94%  Weight:      Height:        Intake/Output Summary (Last 24 hours) at 10/26/2022 0916 Last data filed at 10/25/2022 1447 Gross per 24 hour  Intake 1149 ml  Output --  Net 1149 ml   Filed Weights   10/23/22 2209  Weight: (!) 170 kg    Examination:  General exam: appears comfortable. Morbidly obese Respiratory system: decreased breath sounds b/l  Cardiovascular system: S1 & S2+. No rubs or clicks  Gastrointestinal system: abd is soft, NT, obese & normal bowel sounds  Central nervous system: alert & oriented. Moves all extremities  Psychiatry: judgement and insight appears normal. Flat mood and affect    Data Reviewed: I have personally reviewed following labs and imaging studies  CBC: Recent Labs  Lab 10/23/22 2241 10/24/22 0635 10/24/22 1257 10/24/22 2213 10/25/22 0745 10/25/22 1922 10/26/22 0258  WBC 8.6 8.1  --   --  8.9  --  8.5  NEUTROABS 6.5  --   --   --   --   --   --   HGB 3.9* 4.4* 5.2* 5.6* 5.7* 6.4* 6.5*  HCT 13.0* 14.4* 16.4* 17.5* 18.5* 20.1* 20.7*  MCV 86.1 85.7  --   --  84.1  --  84.8  PLT 265 262  --   --  263  --  231   Basic Metabolic Panel: Recent Labs  Lab 10/23/22 2241 10/24/22 0635 10/25/22 0611 10/26/22 0258  NA 132* 129* 135 136  K 5.0 4.4 4.2 3.7  CL 98 98 102 101  CO2 26 27 24 28   GLUCOSE 313* 321* 193* 141*  BUN 51* 42* 26* 17  CREATININE 1.12 1.08 0.85 0.76  CALCIUM 8.0* 7.6* 7.9* 7.9*  MG 2.5*  --   --   --    GFR: Estimated Creatinine Clearance: 161.2 mL/min (by C-G formula  based on SCr of 0.76 mg/dL). Liver Function Tests: Recent Labs  Lab 10/23/22 2241  AST 14*  ALT 14  ALKPHOS 73  BILITOT 0.5  PROT 5.3*  ALBUMIN 2.5*   No results for input(s): "LIPASE", "AMYLASE" in the last 168 hours. No results for input(s): "AMMONIA" in the last 168 hours. Coagulation Profile: No results for input(s): "INR", "PROTIME" in the last 168 hours. Cardiac Enzymes: No results for input(s): "CKTOTAL", "CKMB", "CKMBINDEX", "TROPONINI" in the last 168 hours. BNP (last 3 results) No results for input(s): "PROBNP" in  the last 8760 hours. HbA1C: No results for input(s): "HGBA1C" in the last 72 hours. CBG: Recent Labs  Lab 10/25/22 2156 10/25/22 2340 10/26/22 0348 10/26/22 0454 10/26/22 0806  GLUCAP 165* 137* 124* 135* 155*   Lipid Profile: No results for input(s): "CHOL", "HDL", "LDLCALC", "TRIG", "CHOLHDL", "LDLDIRECT" in the last 72 hours. Thyroid Function Tests: No results for input(s): "TSH", "T4TOTAL", "FREET4", "T3FREE", "THYROIDAB" in the last 72 hours. Anemia Panel: No results for input(s): "VITAMINB12", "FOLATE", "FERRITIN", "TIBC", "IRON", "RETICCTPCT" in the last 72 hours. Sepsis Labs: No results for input(s): "PROCALCITON", "LATICACIDVEN" in the last 168 hours.  Recent Results (from the past 240 hour(s))  Resp panel by RT-PCR (RSV, Flu A&B, Covid) Anterior Nasal Swab     Status: None   Collection Time: 10/23/22 10:41 PM   Specimen: Anterior Nasal Swab  Result Value Ref Range Status   SARS Coronavirus 2 by RT PCR NEGATIVE NEGATIVE Final    Comment: (NOTE) SARS-CoV-2 target nucleic acids are NOT DETECTED.  The SARS-CoV-2 RNA is generally detectable in upper respiratory specimens during the acute phase of infection. The lowest concentration of SARS-CoV-2 viral copies this assay can detect is 138 copies/mL. A negative result does not preclude SARS-Cov-2 infection and should not be used as the sole basis for treatment or other patient management  decisions. A negative result may occur with  improper specimen collection/handling, submission of specimen other than nasopharyngeal swab, presence of viral mutation(s) within the areas targeted by this assay, and inadequate number of viral copies(<138 copies/mL). A negative result must be combined with clinical observations, patient history, and epidemiological information. The expected result is Negative.  Fact Sheet for Patients:  BloggerCourse.com  Fact Sheet for Healthcare Providers:  SeriousBroker.it  This test is no t yet approved or cleared by the [place] FDA and  has been authorized for detection and/or diagnosis of SARS-CoV-2 by FDA under an Emergency Use Authorization (EUA). This EUA will remain  in effect (meaning this test can be used) for the duration of the COVID-19 declaration under Section 564(b)(1) of the Act, 21 U.S.C.section 360bbb-3(b)(1), unless the authorization is terminated  or revoked sooner.       Influenza A by PCR NEGATIVE NEGATIVE Final   Influenza B by PCR NEGATIVE NEGATIVE Final    Comment: (NOTE) The Xpert Xpress SARS-CoV-2/FLU/RSV plus assay is intended as an aid in the diagnosis of influenza from Nasopharyngeal swab specimens and should not be used as a sole basis for treatment. Nasal washings and aspirates are unacceptable for Xpert Xpress SARS-CoV-2/FLU/RSV testing.  Fact Sheet for Patients: BloggerCourse.com  Fact Sheet for Healthcare Providers: SeriousBroker.it  This test is not yet approved or cleared by the [place] FDA and has been authorized for detection and/or diagnosis of SARS-CoV-2 by FDA under an Emergency Use Authorization (EUA). This EUA will remain in effect (meaning this test can be used) for the duration of the COVID-19 declaration under Section 564(b)(1) of the Act, 21 U.S.C. section 360bbb-3(b)(1), unless the  authorization is terminated or revoked.     Resp Syncytial Virus by PCR NEGATIVE NEGATIVE Final    Comment: (NOTE) Fact Sheet for Patients: BloggerCourse.com  Fact Sheet for Healthcare Providers: SeriousBroker.it  This test is not yet approved or cleared by the [place] FDA and has been authorized for detection and/or diagnosis of SARS-CoV-2 by FDA under an Emergency Use Authorization (EUA). This EUA will remain in effect (meaning this test can be used) for the duration of the COVID-19 declaration under  Section 564(b)(1) of the Act, 21 U.S.C. section 360bbb-3(b)(1), unless the authorization is terminated or revoked.  Performed at [facility], [address], [place], [place] [address]          Radiology Studies: CT ABDOMEN PELVIS W CONTRAST  Result Date: 10/24/2022 CLINICAL DATA:  Blood loss. EXAM: CT ABDOMEN AND PELVIS WITH CONTRAST TECHNIQUE: Multidetector CT imaging of the abdomen and pelvis was performed using the standard protocol following bolus administration of intravenous contrast. RADIATION DOSE REDUCTION: This exam was performed according to the departmental dose-optimization program which includes automated exposure control, adjustment of the mA and/or kV according to patient size and/or use of iterative reconstruction technique. CONTRAST:  OMNIPAQUE IOHEXOL 300 MG/ML  SOLN COMPARISON:  CT 10/12/2022. FINDINGS: Lower chest: There is some linear opacity lung bases likely scar or atelectasis. No pleural effusion. Coronary artery calcifications are seen. There are some prominent nodes identified along the mediastinum at the edge of the imaging field, unchanged from previous. Stable nodular area of thickening along the subcutaneous fat along the anterior aspect of the lower chest wall right of midline on series 2, image 20 Hepatobiliary: No focal liver abnormality is seen. No gallstones, gallbladder wall  thickening, or biliary dilatation. Patent portal vein. Pancreas: Severe atrophy of the pancreas. Spleen: Normal in size without focal abnormality. Adrenals/Urinary Tract: Adrenal glands are preserved. No enhancing renal mass or collecting system dilatation. The ureters have normal course and caliber down to the bladder. The right kidney is malrotated, a congenital variant distended urinary bladder Stomach/Bowel: No oral contrast. The large bowel has a normal course and caliber with some scattered stool. Appendix is not seen. There is some surgical changes along the posterior aspect of the cecum. The stomach and small bowel are nondilated. Vascular/Lymphatic: Normal caliber aorta and IVC with mild vascular calcifications. There are some small retroperitoneal nodes identified, nonpathologic by size criteria. Similar nodes along the pelvic sidewall regions. These are unchanged from previous. Reproductive: Prostate is unremarkable. Other: No retroperitoneal hematoma. No ascites or fluid collection. There is anasarca. Additional skin thickening with stranding along the anterior pelvic wall the level of the perineum, increasing from the prior examination. Please correlate with clinical findings such as cellulitis. Small fat containing umbilical hernia. No soft tissue gas seen. Musculoskeletal: Moderate degenerative changes along the spine with some disc bulging and osteophytes. Degenerative changes along the pelvis. IMPRESSION: No retroperitoneal hematoma identified.  No ascites or free air. No bowel obstruction. Stable subcutaneous area of thickening and nodularity along the anterior abdominal, lower chest wall right of midline. Increasing skin thickening with some stranding along the anterior low pelvis, perineal region. Please correlate with clinical findings. Electronically Signed   By: Karen Kays M.D.   On: 10/24/2022 14:54        Scheduled Meds:  sodium chloride   Intravenous Once   sodium chloride    Intravenous Once   sodium chloride   Intravenous Once   atorvastatin  40 mg Oral Daily   DULoxetine  30 mg Oral Daily   insulin aspart  0-9 Units Subcutaneous Q4H   insulin detemir  42 Units Subcutaneous BID   loratadine  10 mg Oral Daily   pregabalin  150 mg Oral TID   tamsulosin  0.4 mg Oral QPC breakfast   Continuous Infusions:  sodium chloride     pantoprazole 8 mg/hr (10/25/22 2009)     LOS: 2 days    Time spent: 35 mins    Charise Killian,  MD [facility] Pager [phone]  If 7PM-7AM, please contact night-coverage [email or web address] 10/26/2022, 9:16 AM

## 2022-10-26 NOTE — Op Note (Signed)
[facility] Gastroenterology Patient Name: Russell [name] Procedure Date: 10/26/2022 11:00 AM MRN: [id number] Account #: [id number] Date of Birth: [birth year] Admit Type: Inpatient Age: 57 Room: [facility] ENDO ROOM 1 Gender: Male Note Status: Finalized Instrument Name: Upper Endoscope [id number] Procedure:             Upper GI endoscopy Indications:           Melena Providers:             Eather Colas MD, MD Referring MD:          Nat Christen. Zada Finders, MD (Referring MD) Medicines:             Monitored Anesthesia Care Complications:         No immediate complications. Procedure:             Pre-Anesthesia Assessment:                        - Prior to the procedure, a History and Physical was                         performed, and patient medications and allergies were                         reviewed. The patient is competent. The risks and                         benefits of the procedure and the sedation options and                         risks were discussed with the patient. All questions                         were answered and informed consent was obtained.                         Patient identification and proposed procedure were                         verified by the physician, the nurse, the                         anesthesiologist, the anesthetist and the technician                         in the endoscopy suite. Mental Status Examination:                         alert and oriented. Airway Examination: normal                         oropharyngeal airway and neck mobility. Respiratory                         Examination: clear to auscultation. CV Examination:                         normal. Prophylactic Antibiotics: The patient does not  require prophylactic antibiotics. Prior                         Anticoagulants: The patient has taken no anticoagulant                         or antiplatelet agents. ASA Grade Assessment: IV - A                          patient with severe systemic disease that is a                         constant threat to life. After reviewing the risks and                         benefits, the patient was deemed in satisfactory                         condition to undergo the procedure. The anesthesia                         plan was to use monitored anesthesia care (MAC).                         Immediately prior to administration of medications,                         the patient was re-assessed for adequacy to receive                         sedatives. The heart rate, respiratory rate, oxygen                         saturations, blood pressure, adequacy of pulmonary                         ventilation, and response to care were monitored                         throughout the procedure. The physical status of the                         patient was re-assessed after the procedure.                        After obtaining informed consent, the endoscope was                         passed under direct vision. Throughout the procedure,                         the patient's blood pressure, pulse, and oxygen                         saturations were monitored continuously. The                         Endosonoscope was introduced through the mouth, and  advanced to the second part of duodenum. The upper GI                         endoscopy was somewhat difficult due to the patient's                         body habitus. The patient tolerated the procedure well. Findings:      The examined esophagus was normal.      The entire examined stomach was normal. The ulcers noted on previous EGD       have healed.      Two non-bleeding superficial duodenal ulcers with a clean ulcer base       (Forrest Class III) were found in the first portion of the duodenum. The       largest lesion was 4 mm in largest dimension.      The exam of the duodenum was otherwise normal. Impression:            -  Normal esophagus.                        - Normal stomach.                        - Non-bleeding duodenal ulcers with a clean ulcer base                         (Forrest Class III).                        - No specimens collected. Recommendation:        - Return patient to hospital ward for ongoing care.                        - Advance diet as tolerated.                        - Continue present medications.                        - Would recommend continue to monitor hemoglobins. Can                         switch to PO PPI BID. Counseled on no further NSAIDS.                         If recurrent episodes of melena then would consider                         VCE verusus CT enterography to look for small bowel                         lesions. Procedure Code(s):     --- Professional ---                        [phone], Esophagogastroduodenoscopy, flexible,                         transoral; diagnostic, including collection of  specimen(s) by brushing or washing, when performed                         (separate procedure) Diagnosis Code(s):     --- Professional ---                        K26.9, Duodenal ulcer, unspecified as acute or                         chronic, without hemorrhage or perforation                        K92.1, Melena (includes Hematochezia) CPT copyright 2022 American Medical Association. All rights reserved. The codes documented in this report are preliminary and upon coder review may  be revised to meet current compliance requirements. Eather Colas MD, MD 10/26/2022 1:16:48 PM Number of Addenda: 0 Note Initiated On: 10/26/2022 11:00 AM Estimated Blood Loss:  Estimated blood loss: none.      [facility]

## 2022-10-26 NOTE — Progress Notes (Signed)

## 2022-10-27 ENCOUNTER — Encounter: Payer: Self-pay | Admitting: Gastroenterology

## 2022-10-27 DIAGNOSIS — D62 Acute posthemorrhagic anemia: Secondary | ICD-10-CM | POA: Diagnosis not present

## 2022-10-27 LAB — BASIC METABOLIC PANEL
Anion gap: 5 (ref 5–15)
BUN: 13 mg/dL (ref 6–20)
CO2: 26 mmol/L (ref 22–32)
Calcium: 8 mg/dL — ABNORMAL LOW (ref 8.9–10.3)
Chloride: 105 mmol/L (ref 98–111)
Creatinine, Ser: 0.83 mg/dL (ref 0.61–1.24)
GFR, Estimated: >60 mL/min (ref >60)
Glucose, Bld: 200 mg/dL — ABNORMAL HIGH (ref 70–99)
Potassium: 4 mmol/L (ref 3.5–5.1)
Sodium: 136 mmol/L (ref 135–145)

## 2022-10-27 LAB — BPAM RBC
Blood Product Expiration Date: 202408312359
Blood Product Expiration Date: 202408312359
Blood Product Expiration Date: 202409162359
Blood Product Expiration Date: 202409202359
Blood Product Expiration Date: 202409232359
Blood Product Expiration Date: 202409242359
ISSUE DATE / TIME: 202408250305
ISSUE DATE / TIME: 202408250614
ISSUE DATE / TIME: 202408251441
ISSUE DATE / TIME: 202408260940
ISSUE DATE / TIME: 202408261230
ISSUE DATE / TIME: 202408271016
Unit Type and Rh: 5100
Unit Type and Rh: 5100
Unit Type and Rh: 5100
Unit Type and Rh: 5100
Unit Type and Rh: 9500
Unit Type and Rh: 9500

## 2022-10-27 LAB — BLOOD GAS, VENOUS
Acid-Base Excess: 1.9 mmol/L (ref 0.0–2.0)
Bicarbonate: 28.2 mmol/L — ABNORMAL HIGH (ref 20.0–28.0)
Patient temperature: 37
pCO2, Ven: 50 mmHg (ref 44–60)
pH, Ven: 7.36 (ref 7.25–7.43)

## 2022-10-27 LAB — CBC
HCT: 22.7 % — ABNORMAL LOW (ref 39.0–52.0)
Hemoglobin: 7.3 g/dL — ABNORMAL LOW (ref 13.0–17.0)
MCH: 26.8 pg (ref 26.0–34.0)
MCHC: 32.2 g/dL (ref 30.0–36.0)
MCV: 83.5 fL (ref 80.0–100.0)
Platelets: 209 10*3/uL (ref 150–400)
RBC: 2.72 MIL/uL — ABNORMAL LOW (ref 4.22–5.81)
RDW: 15.1 % (ref 11.5–15.5)
WBC: 5.6 10*3/uL (ref 4.0–10.5)
nRBC: 1.1 % — ABNORMAL HIGH (ref 0.0–0.2)

## 2022-10-27 LAB — TYPE AND SCREEN
ABO/RH(D): O NEG
Antibody Screen: NEGATIVE
Unit division: 0
Unit division: 0
Unit division: 0
Unit division: 0
Unit division: 0
Unit division: 0

## 2022-10-27 LAB — GLUCOSE, CAPILLARY
Glucose-Capillary: 186 mg/dL — ABNORMAL HIGH (ref 70–99)
Glucose-Capillary: 202 mg/dL — ABNORMAL HIGH (ref 70–99)
Glucose-Capillary: 224 mg/dL — ABNORMAL HIGH (ref 70–99)
Glucose-Capillary: 221 mg/dL — ABNORMAL HIGH (ref 70–99)
Glucose-Capillary: 185 mg/dL — ABNORMAL HIGH (ref 70–99)
Glucose-Capillary: 187 mg/dL — ABNORMAL HIGH (ref 70–99)

## 2022-10-27 MED ORDER — DULOXETINE HCL 30 MG PO CPEP
30.0000 mg | ORAL_CAPSULE | Freq: Once | ORAL | Status: AC
  Administered 2022-10-27: 30 mg via ORAL
  Filled 2022-10-27: qty 1

## 2022-10-27 MED ORDER — MELATONIN 5 MG PO TABS
5.0000 mg | ORAL_TABLET | Freq: Every day | ORAL | Status: DC
  Administered 2022-10-27 – 2022-11-07 (×12): 5 mg via ORAL
  Filled 2022-10-27 (×13): qty 1

## 2022-10-27 MED ORDER — DULOXETINE HCL 30 MG PO CPEP
60.0000 mg | ORAL_CAPSULE | Freq: Every day | ORAL | Status: DC
  Administered 2022-10-28 – 2022-11-08 (×12): 60 mg via ORAL
  Filled 2022-10-27 (×12): qty 2

## 2022-10-27 NOTE — TOC Progression Note (Signed)
Transition of Care [facility]) - Progression Note    Patient Details  Name: L[place] DOHRN MRN: [id number] Date of Birth: 1965-06-28  Transition of Care [facility]) CM/SW Contact  Truddie Hidden, RN Phone Number: 10/27/2022, 4:02 PM  Clinical Narrative:    Spoke with patient at bedside regarding therapy's recommendation for SNF. He is aware recommendation is STR and is agreeable. Patient would like to search for facilities near [place] as is wife is located in that area.   [place] PASSR pending.  Bed search initiated.    Expected Discharge Plan: Home/Self Care Barriers to Discharge: Continued Medical Work up  Expected Discharge Plan and Services       Living arrangements for the past 2 months: Single Family Home                                       Social Determinants of Health (SDOH) Interventions SDOH Screenings   Food Insecurity: No Food Insecurity (10/24/2022)  Housing: Low Risk  (10/24/2022)  Transportation Needs: No Transportation Needs (10/24/2022)  Utilities: Not At Risk (10/24/2022)  Financial Resource Strain: Medium Risk (06/17/2022)   Received from [facility]  Tobacco Use: Low Risk  (10/26/2022)    Readmission Risk Interventions    10/25/2022    3:55 PM  Readmission Risk Prevention Plan  Transportation Screening Complete  PCP or Specialist Appt within 3-5 Days Complete  HRI or Home Care Consult Complete  Social Work Consult for Recovery Care Planning/Counseling Complete  Palliative Care Screening Not Applicable  Medication Review Oceanographer) Complete

## 2022-10-27 NOTE — Plan of Care (Signed)

## 2022-10-27 NOTE — [place] FL2 (Signed)
[place] MEDICAID FL2 LEVEL OF CARE FORM     IDENTIFICATION  Patient Name: Russell [name] Birthdate: [birth year] Sex: male Admission Date (Current Location): 10/23/2022  [facility] and [place] Number:  Chiropodist and Address:  [facility], [address], [place], [place] [address]      Provider Number: [id number]  Attending Physician Name and Address:  Lucile Shutters, MD  Relative Name and Phone Number:  Ladarren, Estime (Spouse)  [phone] (Mobile    Current Level of Care: Hospital Recommended Level of Care: Skilled Nursing Facility Prior Approval Number:    Date Approved/Denied:   PASRR Number:    Discharge Plan: Home    Current Diagnoses: Patient Active Problem List   Diagnosis Date Noted   Dyslipidemia 10/24/2022   Anxiety and depression 10/24/2022   Essential hypertension 10/24/2022   Type 2 diabetes mellitus with peripheral neuropathy (HCC) 10/24/2022   Acute gastric ulcer with hemorrhage 10/24/2022   Acute urinary retention 10/17/2022   Acute respiratory failure with hypoxia (HCC) 10/15/2022   Multiple gastric ulcers 10/14/2022   Hyperosmolar hyperglycemic state (HHS) (HCC) 10/13/2022   HLD (hyperlipidemia) 10/13/2022   Acute on chronic diastolic CHF (congestive heart failure) (HCC) 10/13/2022   Depression with anxiety 10/13/2022   Hyperkalemia 10/13/2022   Elevated lactic acid level 10/13/2022   Rectal bleeding 10/13/2022   Acute blood loss anemia 10/13/2022   Wound infection_ left lower leg 10/13/2022   Upper GI bleed 10/13/2022   Hypovolemic shock (HCC) 10/13/2022   Epigastric abdominal pain 10/13/2022   Sciatica of right side 10/13/2022   AKI (acute kidney injury) (HCC) 10/13/2022   Hypotension 11/21/2020   Atrial flutter with rapid ventricular response (HCC) 11/21/2020   Chronic kidney disease, stage 4, severely decreased GFR (HCC) 06/14/2018   Chronic venous insufficiency 06/14/2018   Cellulitis of left lower  extremity 02/01/2018   Cellulitis 05/18/2017   Hyperlipidemia, mixed 01/10/2017   Cellulitis and abscess of left leg 08/08/2016   Diabetes with ulcer of leg (HCC) 08/08/2016   Hyperglycemia 08/08/2016   Morbid obesity (HCC) 08/08/2016   Cellulitis and abscess of leg 08/08/2016   Adjustment disorder with depressed mood 11/21/2014   Other social stressor 11/21/2014   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   Diabetes (HCC) 11/21/2014    Orientation RESPIRATION BLADDER Height & Weight     Self, Time, Situation, Place  O2 (3 liters of oxygen per nasal cannula) Continent Weight: (!) 170 kg Height:  5\' 11"  (180.3 cm)  BEHAVIORAL SYMPTOMS/MOOD NEUROLOGICAL BOWEL NUTRITION STATUS  Other (Comment) (n/a)  (n/a) Continent Diet (Carb modified)  AMBULATORY STATUS COMMUNICATION OF NEEDS Skin   Limited Assist Verbally                         Personal Care Assistance Level of Assistance  Bathing, Dressing Bathing Assistance: Limited assistance   Dressing Assistance: Limited assistance     Functional Limitations Info  Sight Sight Info: Impaired        SPECIAL CARE FACTORS FREQUENCY  PT (By licensed PT), OT (By licensed OT)     PT Frequency: Min 2x weekly OT Frequency: Min 2x weekly            Contractures Contractures Info: Not present    Additional Factors Info  Code Status, Allergies Code Status Info: FULL Allergies Info: Fluoxetine, Shellfish Allergy, Sulfa Antibiotics, Vancomycin, Metformin           Current Medications (10/27/2022):  This is the current hospital active medication list Current Facility-Administered Medications  Medication Dose Route Frequency Provider Last Rate Last Admin   0.9 %  sodium chloride infusion (Manually program via Guardrails IV Fluids)   Intravenous Once Charise Killian, MD       0.9 %  sodium chloride infusion (Manually program via Guardrails IV Fluids)   Intravenous Once Charise Killian, MD       acetaminophen  (TYLENOL) tablet 650 mg  650 mg Oral Q6H PRN Mansy, Jan A, MD   650 mg at 10/26/22 7829   Or   acetaminophen (TYLENOL) suppository 650 mg  650 mg Rectal Q6H PRN Mansy, Jan A, MD       atorvastatin (LIPITOR) tablet 40 mg  40 mg Oral Daily Mansy, Jan A, MD   40 mg at 10/27/22 0814   DULoxetine (CYMBALTA) DR capsule 30 mg  30 mg Oral Daily Mansy, Jan A, MD   30 mg at 10/27/22 0815   guaiFENesin (ROBITUSSIN) 100 MG/5ML liquid 15 mL  15 mL Oral Q4H PRN Charise Killian, MD   15 mL at 10/27/22 0814   HYDROmorphone (DILAUDID) injection 1 mg  1 mg Intravenous Q4H PRN Mansy, Jan A, MD   1 mg at 10/27/22 1343   insulin aspart (novoLOG) injection 0-9 Units  0-9 Units Subcutaneous Q4H Mansy, Jan A, MD   3 Units at 10/27/22 1344   insulin detemir (LEVEMIR) injection 42 Units  42 Units Subcutaneous BID Mansy, Jan A, MD   42 Units at 10/27/22 0827   ipratropium-albuterol (DUONEB) 0.5-2.5 (3) MG/3ML nebulizer solution 3 mL  3 mL Nebulization Q6H PRN Charise Killian, MD   3 mL at 10/27/22 1345   loratadine (CLARITIN) tablet 10 mg  10 mg Oral Daily Mansy, Jan A, MD   10 mg at 10/27/22 0815   ondansetron (ZOFRAN) tablet 4 mg  4 mg Oral Q6H PRN Mansy, Jan A, MD       Or   ondansetron [facility]) injection 4 mg  4 mg Intravenous Q6H PRN Mansy, Jan A, MD   4 mg at 10/26/22 2139   oxyCODONE-acetaminophen (PERCOCET/ROXICET) 5-325 MG per tablet 1 tablet  1 tablet Oral Q6H PRN Mansy, Jan A, MD   1 tablet at 10/27/22 1343   pantoprazole (PROTONIX) EC tablet 40 mg  40 mg Oral BID Charise Killian, MD   40 mg at 10/27/22 0815   potassium chloride SA (KLOR-CON M) CR tablet 20 mEq  20 mEq Oral BID PRN Mansy, Jan A, MD       pregabalin (LYRICA) capsule 150 mg  150 mg Oral TID Mansy, Jan A, MD   150 mg at 10/27/22 5621   senna-docusate (Senokot-S) tablet 2 tablet  2 tablet Oral Daily PRN Mansy, Jan A, MD       sodium chloride flush (NS) 0.9 % injection 10-40 mL  10-40 mL Intracatheter Q12H Charise Killian, MD   10 mL  at 10/27/22 1453   sodium chloride flush (NS) 0.9 % injection 10-40 mL  10-40 mL Intracatheter PRN Charise Killian, MD       tamsulosin [facility]) capsule 0.4 mg  0.4 mg Oral QPC breakfast Mansy, Jan A, MD   0.4 mg at 10/27/22 3086   traZODone (DESYREL) tablet 25 mg  25 mg Oral QHS PRN Mansy, Jan A, MD   25 mg at 10/24/22 2246     Discharge Medications: Please see discharge summary for a list of  discharge medications.  Relevant Imaging Results:  Relevant Lab Results:   Additional Information SSN [id number]   Truddie Hidden, RN

## 2022-10-27 NOTE — Progress Notes (Signed)
Progress Note   Patient: Russell [name] WGN:[id number] DOB: 06-27-1965 DOA: 10/23/2022     3 DOS: the patient was seen and examined on 10/27/2022   Brief hospital course:  JOHNNY KEIRNS is a 57 y.o. Caucasian male with medical history significant for combined systolic and diastolic CHF, right BKA, anxiety and depression, type 2 diabetes mellitus, hypertension, dyslipidemia and morbid obesity, who presented to the emergency room with acute onset of dyspnea.  The patient was just admitted from 8/13 till 8/24 acute blood loss anemia with associated upper GI bleeding and hypovolemic shock.  His EGD showed gastritis and nonbleeding gastric ulcers.  His colonoscopy showed no active bleeding.  He received a total of 4 units of PRBCs during his hospitalization as well as IV iron.  His hemoglobin was 8.3 on 8/18.  He required a brief IV Levophed for his hypovolemic shock.  He was placed on IV insulin drip for hyperosmolar hyperglycemic state.  He currently denies any nausea or vomiting or abdominal pain or heartburn.  He did not notice any melena or bright red bleeding per rectum.  No dysuria, oliguria or hematuria or flank pain.  No chest pain or palpitations.  No cough or wheezing or hemoptysis.  No other bleeding diathesis.  He is stated that this dyspnea started from the night he was discharged home.  He took 2 aspirin that night for headache.  He has not taken any more aspirin.  He denied taking Eliquis.     ED Course: When he came to the ER, BP was 103/41 with otherwise normal vital signs.  Labs revealed hemoglobin of 3.9 hematocrit 13 with MCH of 25.8.  High sensitive troponin was 6 and later 7.  BNP was 190.6.  CMP showed sodium 132 potassium 5, glucose 313 and BUN 51 with a creatinine 1.12.  Magnesium was 2.5 and albumin 2.5 with total protein of 5.3.  Beta-hydroxybutyrate was normal at 0.21. EKG as reviewed by me : EKG showed accelerated junctional rhythm with a rate of 72 with nonspecific  interventricular conduction delay. Imaging: Portable chest x-ray showed stable cardiomegaly and vascular congestion and improved left lung base opacity.   The patient was typed and crossmatch and will be transfused 2 units of packed red blood cells.  He was given 4 mg of IV morphine sulfate, 80 mg of IV Protonix bolus followed by IV drip.  He will be admitted to an observation progressive unit bed for further evaluation and management.     Assessment and Plan:   Acute blood loss anemia: likely secondary to recurrent upper GI bleed.  Secondary to NSAID induced peptic ulcer disease Status post transfusion of 6 units of packed RBC Had an upper endoscopy with healed gastric ulcers.  Nonbleeding superficial duodenal ulcers Continue to hold aspirin, plavix.  Monitor H&H closely     Hyponatremia:  Resolved    HLD:  continue on statin    DM2: likely poorly controlled.  Blood sugars are stable Continue on levemir,  SSI w/ accuchecks    Peripheral arterial disease /Hx of right BKA:  continue w/ supportive care    Peripheral neuropathy:  continue on home dose of pregabalin    Depression: severity unknown.  Continue on home dose of duloxetine. Holding nortriptyline  Consult psych   Morbid obesity: BMI 52.2.  Complicates overall prognosis and care   Generalized weakness:  PT/OT consulted.  They recommend skilled nursing facility on discharge      Subjective: Patient is seen and  examined at the bedside.  Complains of feeling weak. No further episodes of bleeding.  Physical Exam: Vitals:   10/27/22 0819 10/27/22 1133 10/27/22 1349 10/27/22 1721  BP: (!) 145/70 (!) 150/71  (!) 149/77  Pulse: 91 86    Resp: 16 16  16   Temp: 98.6 F (37 C) 98.2 F (36.8 C)  (!) 100.9 F (38.3 C)  TempSrc: Oral Oral  Oral  SpO2: (!) 86% 93% 100% 97%  Weight:      Height:       General exam: appears comfortable. Morbidly obese Respiratory system: decreased breath sounds b/l   Cardiovascular system: S1 & S2+. No rubs or clicks  Gastrointestinal system: abd is soft, NT, obese & normal bowel sounds  Central nervous system: alert & oriented. Moves all extremities  Psychiatry: judgement and insight appears normal. Flat mood and affect   Data Reviewed: Labs are reviewed.  Hemoglobin 7.3 There are no new results to review at this time.  Family Communication: Plan of care discussed with patient in detail.  Disposition: Status is: Inpatient Remains inpatient appropriate because: Needs close monitoring of his H&H.  Planned Discharge Destination: Skilled nursing facility    Time spent: 35 minutes  Author: Lucile Shutters, MD 10/27/2022 5:22 PM  For on call review [email or web address].

## 2022-10-27 NOTE — Progress Notes (Signed)
  Chaplain On-Call responded to Spiritual Care Consult Request from Thurston Hole, NP. The request was for prayer with the patient.  Chaplain met the patient and provided much spiritual and emotional support as he described several acute medical conditions that are life-altering for him.  He stated his strong faith in the [place], and his hope that there can be healing for him in order to relieve constant pain.  Chaplain provided prayer and encouragement.  Chaplain Morene Crocker., [facility]

## 2022-10-27 NOTE — Anesthesia Postprocedure Evaluation (Signed)
Anesthesia Post Note  Patient: Russell [name]  Procedure(s) Performed: ESOPHAGOGASTRODUODENOSCOPY (EGD) WITH PROPOFOL  Patient location during evaluation: Endoscopy Anesthesia Type: General Level of consciousness: awake and alert Pain management: pain level controlled Vital Signs Assessment: post-procedure vital signs reviewed and stable Respiratory status: spontaneous breathing, nonlabored ventilation, respiratory function stable and patient connected to nasal cannula oxygen Cardiovascular status: blood pressure returned to baseline and stable Postop Assessment: no apparent nausea or vomiting Anesthetic complications: no   There were no known notable events for this encounter.   Last Vitals:  Vitals:   10/27/22 0819 10/27/22 1133  BP: (!) 145/70 (!) 150/71  Pulse: 91 86  Resp: 16 16  Temp: 37 C 36.8 C  SpO2: (!) 86% 93%    Last Pain:  Vitals:   10/27/22 1133  TempSrc: Oral  PainSc:                  [name]

## 2022-10-27 NOTE — Consult Note (Signed)
[facility] Face-to-Face Psychiatry Consult   Reason for Consult:  Depression  Referring Physician: Lucile Shutters, MD Patient Identification: Russell [name] MRN:  [id number] Principal Diagnosis: Acute blood loss anemia Diagnosis:  Principal Problem:   Acute blood loss anemia Active Problems:   Dyslipidemia   Anxiety and depression   Essential hypertension   Type 2 diabetes mellitus with peripheral neuropathy (HCC)   Acute gastric ulcer with hemorrhage   Total Time spent with patient: 45 minutes  Subjective:   Russell [name] is a 57 y.o. male patient with ***.  HPI:  ***  Past Psychiatric History: Anxiety, Depression   Risk to Self:   Risk to Others:   Prior Inpatient Therapy:   Prior Outpatient Therapy:    Past Medical History:  Past Medical History:  Diagnosis Date  . Anxiety   . CHF (congestive heart failure) (HCC)    EF 30-35%  . Chronic diastolic (congestive) heart failure (HCC)   . Depression   . Diabetes mellitus without complication (HCC)   . Hyperlipemia   . Hypertension   . Morbid obesity (HCC)   . Peripheral edema     Past Surgical History:  Procedure Laterality Date  . APPENDECTOMY    . BELOW KNEE LEG AMPUTATION     Right leg  . BIOPSY  10/14/2022   Procedure: BIOPSY;  Surgeon: Norma Fredrickson, Boykin Nearing, MD;  Location: [facility] ENDOSCOPY;  Service: Gastroenterology;;  . COLONOSCOPY N/A 10/16/2022   Procedure: COLONOSCOPY;  Surgeon: [name], MD;  Location: [facility] ENDOSCOPY;  Service: Gastroenterology;  Laterality: N/A;  . ESOPHAGOGASTRODUODENOSCOPY (EGD) WITH PROPOFOL N/A 10/14/2022   Procedure: ESOPHAGOGASTRODUODENOSCOPY (EGD) WITH PROPOFOL;  Surgeon: Toledo, Boykin Nearing, MD;  Location: [facility] ENDOSCOPY;  Service: Gastroenterology;  Laterality: N/A;  . ESOPHAGOGASTRODUODENOSCOPY (EGD) WITH PROPOFOL N/A 10/26/2022   Procedure: ESOPHAGOGASTRODUODENOSCOPY (EGD) WITH PROPOFOL;  Surgeon: Regis Bill, MD;  Location: [facility] ENDOSCOPY;  Service: Endoscopy;  Laterality: N/A;   . TONSILLECTOMY     Family History:  Family History  Problem Relation Age of Onset  . COPD Mother   . Diabetes Father   . CAD Father   . Kidney cancer Brother    Family Psychiatric  History: *** Social History:  Social History   Substance and Sexual Activity  Alcohol Use No     Social History   Substance and Sexual Activity  Drug Use No    Social History   Socioeconomic History  . Marital status: Legally Separated    Spouse name: Not on file  . Number of children: Not on file  . Years of education: Not on file  . Highest education level: Not on file  Occupational History  . Not on file  Tobacco Use  . Smoking status: Never  . Smokeless tobacco: Never  Vaping Use  . Vaping status: Never Used  Substance and Sexual Activity  . Alcohol use: No  . Drug use: No  . Sexual activity: Not Currently  Other Topics Concern  . Not on file  Social History Narrative   Living with mother now   Wheelchair bound at baseline   Social Determinants of Health   Financial Resource Strain: Medium Risk (06/17/2022)   Received from [facility]   Overall Financial Resource Strain (CARDIA)   . Difficulty of Paying Living Expenses: Somewhat hard  Food Insecurity: No Food Insecurity (10/24/2022)   Hunger Vital Sign   . Worried About Programme researcher, broadcasting/film/video in the Last Year: Never true   .  Ran Out of Food in the Last Year: Never true  Transportation Needs: No Transportation Needs (10/24/2022)   PRAPARE - Transportation   . Lack of Transportation (Medical): No   . Lack of Transportation (Non-Medical): No  Physical Activity: Not on file  Stress: Not on file  Social Connections: Not on file   Additional Social History:    Allergies:   Allergies  Allergen Reactions  . Fluoxetine     Other reaction(s): Hallucination  . Shellfish Allergy Hives  . Sulfa Antibiotics Hives  . Vancomycin     Other reaction(s): Red Man Syndrome  . Metformin Nausea Only    Labs:   Results for orders placed or performed during the hospital encounter of 10/23/22 (from the past 48 hour(s))  Hemoglobin and hematocrit, blood     Status: Abnormal   Collection Time: 10/25/22  7:22 PM  Result Value Ref Range   Hemoglobin 6.4 (L) 13.0 - 17.0 g/dL   HCT 16.1 (L) 09.6 - 04.5 %    Comment: Performed at [facility], [address] [place], [place] [address]  Glucose, capillary     Status: Abnormal   Collection Time: 10/25/22  7:59 PM  Result Value Ref Range   Glucose-Capillary 145 (H) 70 - 99 mg/dL    Comment: Glucose reference range applies only to samples taken after fasting for at least 8 hours.  Glucose, capillary     Status: Abnormal   Collection Time: 10/25/22  9:56 PM  Result Value Ref Range   Glucose-Capillary 165 (H) 70 - 99 mg/dL    Comment: Glucose reference range applies only to samples taken after fasting for at least 8 hours.  Glucose, capillary     Status: Abnormal   Collection Time: 10/25/22 11:40 PM  Result Value Ref Range   Glucose-Capillary 137 (H) 70 - 99 mg/dL    Comment: Glucose reference range applies only to samples taken after fasting for at least 8 hours.  CBC     Status: Abnormal   Collection Time: 10/26/22  2:58 AM  Result Value Ref Range   WBC 8.5 4.0 - 10.5 K/uL   RBC 2.44 (L) 4.22 - 5.81 MIL/uL   Hemoglobin 6.5 (L) 13.0 - 17.0 g/dL   HCT 19.1 (L) 47.8 - 29.5 %   MCV 84.8 80.0 - 100.0 fL   MCH 26.6 26.0 - 34.0 pg   MCHC 31.4 30.0 - 36.0 g/dL   RDW 62.1 30.8 - 65.7 %   Platelets 231 150 - 400 K/uL   nRBC 1.2 (H) 0.0 - 0.2 %    Comment: Performed at [facility], [address] [place], [place] [address]  Basic metabolic panel     Status: Abnormal   Collection Time: 10/26/22  2:58 AM  Result Value Ref Range   Sodium 136 135 - 145 mmol/L   Potassium 3.7 3.5 - 5.1 mmol/L   Chloride 101 98 - 111 mmol/L   CO2 28 22 - 32 mmol/L   Glucose, Bld 141 (H) 70 - 99 mg/dL    Comment: Glucose reference range applies only to  samples taken after fasting for at least 8 hours.   BUN 17 6 - 20 mg/dL   Creatinine, Ser 2.95 0.61 - 1.24 mg/dL   Calcium 7.9 (L) 8.9 - 10.3 mg/dL   GFR, Estimated >28 >41 mL/min    Comment: (NOTE) Calculated using the CKD-EPI Creatinine Equation (2021)    Anion gap 7 5 - 15  Comment: Performed at [facility], [address] [place], [place] [address]  Glucose, capillary     Status: Abnormal   Collection Time: 10/26/22  3:48 AM  Result Value Ref Range   Glucose-Capillary 124 (H) 70 - 99 mg/dL    Comment: Glucose reference range applies only to samples taken after fasting for at least 8 hours.  Glucose, capillary     Status: Abnormal   Collection Time: 10/26/22  4:54 AM  Result Value Ref Range   Glucose-Capillary 135 (H) 70 - 99 mg/dL    Comment: Glucose reference range applies only to samples taken after fasting for at least 8 hours.  Glucose, capillary     Status: Abnormal   Collection Time: 10/26/22  8:06 AM  Result Value Ref Range   Glucose-Capillary 155 (H) 70 - 99 mg/dL    Comment: Glucose reference range applies only to samples taken after fasting for at least 8 hours.  Prepare RBC (crossmatch)     Status: None   Collection Time: 10/26/22  8:30 AM  Result Value Ref Range   Order Confirmation      ORDER PROCESSED BY BLOOD BANK Performed at [facility], [address] [place], [place] [address]   Glucose, capillary     Status: Abnormal   Collection Time: 10/26/22 11:38 AM  Result Value Ref Range   Glucose-Capillary 140 (H) 70 - 99 mg/dL    Comment: Glucose reference range applies only to samples taken after fasting for at least 8 hours.  Glucose, capillary     Status: Abnormal   Collection Time: 10/26/22  5:37 PM  Result Value Ref Range   Glucose-Capillary 158 (H) 70 - 99 mg/dL    Comment: Glucose reference range applies only to samples taken after fasting for at least 8 hours.  Hemoglobin and hematocrit, blood     Status: Abnormal    Collection Time: 10/26/22  5:55 PM  Result Value Ref Range   Hemoglobin 7.2 (L) 13.0 - 17.0 g/dL   HCT 09.8 (L) 11.9 - 14.7 %    Comment: Performed at [facility], [address] [place], [place] [address]  Glucose, capillary     Status: Abnormal   Collection Time: 10/26/22  7:40 PM  Result Value Ref Range   Glucose-Capillary 161 (H) 70 - 99 mg/dL    Comment: Glucose reference range applies only to samples taken after fasting for at least 8 hours.  Glucose, capillary     Status: Abnormal   Collection Time: 10/26/22  9:33 PM  Result Value Ref Range   Glucose-Capillary 150 (H) 70 - 99 mg/dL    Comment: Glucose reference range applies only to samples taken after fasting for at least 8 hours.  Glucose, capillary     Status: Abnormal   Collection Time: 10/26/22 11:21 PM  Result Value Ref Range   Glucose-Capillary 189 (H) 70 - 99 mg/dL    Comment: Glucose reference range applies only to samples taken after fasting for at least 8 hours.  Glucose, capillary     Status: Abnormal   Collection Time: 10/27/22  3:29 AM  Result Value Ref Range   Glucose-Capillary 186 (H) 70 - 99 mg/dL    Comment: Glucose reference range applies only to samples taken after fasting for at least 8 hours.  CBC     Status: Abnormal   Collection Time: 10/27/22  4:54 AM  Result Value Ref Range   WBC 5.6 4.0 - 10.5 K/uL  RBC 2.72 (L) 4.22 - 5.81 MIL/uL   Hemoglobin 7.3 (L) 13.0 - 17.0 g/dL   HCT 16.1 (L) 09.6 - 04.5 %   MCV 83.5 80.0 - 100.0 fL   MCH 26.8 26.0 - 34.0 pg   MCHC 32.2 30.0 - 36.0 g/dL   RDW 40.9 81.1 - 91.4 %   Platelets 209 150 - 400 K/uL   nRBC 1.1 (H) 0.0 - 0.2 %    Comment: Performed at [facility], [address] [place], [place] [address]  Basic metabolic panel     Status: Abnormal   Collection Time: 10/27/22  4:54 AM  Result Value Ref Range   Sodium 136 135 - 145 mmol/L   Potassium 4.0 3.5 - 5.1 mmol/L   Chloride 105 98 - 111 mmol/L   CO2 26 22 - 32 mmol/L    Glucose, Bld 200 (H) 70 - 99 mg/dL    Comment: Glucose reference range applies only to samples taken after fasting for at least 8 hours.   BUN 13 6 - 20 mg/dL   Creatinine, Ser 6.21 0.61 - 1.24 mg/dL   Calcium 8.0 (L) 8.9 - 10.3 mg/dL   GFR, Estimated >30 >86 mL/min    Comment: (NOTE) Calculated using the CKD-EPI Creatinine Equation (2021)    Anion gap 5 5 - 15    Comment: Performed at [facility], [address] [place], [place] [address]  Glucose, capillary     Status: Abnormal   Collection Time: 10/27/22  8:20 AM  Result Value Ref Range   Glucose-Capillary 202 (H) 70 - 99 mg/dL    Comment: Glucose reference range applies only to samples taken after fasting for at least 8 hours.  Glucose, capillary     Status: Abnormal   Collection Time: 10/27/22  1:40 PM  Result Value Ref Range   Glucose-Capillary 224 (H) 70 - 99 mg/dL    Comment: Glucose reference range applies only to samples taken after fasting for at least 8 hours.    Current Facility-Administered Medications  Medication Dose Route Frequency Provider Last Rate Last Admin  . 0.9 %  sodium chloride infusion (Manually program via Guardrails IV Fluids)   Intravenous Once Charise Killian, MD      . 0.9 %  sodium chloride infusion (Manually program via Guardrails IV Fluids)   Intravenous Once Charise Killian, MD      . acetaminophen (TYLENOL) tablet 650 mg  650 mg Oral Q6H PRN Mansy, Jan A, MD   650 mg at 10/26/22 9629   Or  . acetaminophen (TYLENOL) suppository 650 mg  650 mg Rectal Q6H PRN Mansy, Jan A, MD      . atorvastatin (LIPITOR) tablet 40 mg  40 mg Oral Daily Mansy, Jan A, MD   40 mg at 10/27/22 5284  . DULoxetine (CYMBALTA) DR capsule 30 mg  30 mg Oral Daily Mansy, Jan A, MD   30 mg at 10/27/22 0815  . guaiFENesin (ROBITUSSIN) 100 MG/5ML liquid 15 mL  15 mL Oral Q4H PRN Charise Killian, MD   15 mL at 10/27/22 0814  . HYDROmorphone (DILAUDID) injection 1 mg  1 mg Intravenous Q4H PRN Mansy, Jan A, MD    1 mg at 10/27/22 1343  . insulin aspart (novoLOG) injection 0-9 Units  0-9 Units Subcutaneous Q4H Mansy, Vernetta Honey, MD   3 Units at 10/27/22 1344  . insulin detemir (LEVEMIR) injection 42 Units  42 Units Subcutaneous BID Mansy, Vernetta Honey, MD  42 Units at 10/27/22 0827  . ipratropium-albuterol (DUONEB) 0.5-2.5 (3) MG/3ML nebulizer solution 3 mL  3 mL Nebulization Q6H PRN Charise Killian, MD   3 mL at 10/27/22 1345  . loratadine (CLARITIN) tablet 10 mg  10 mg Oral Daily Mansy, Jan A, MD   10 mg at 10/27/22 0815  . ondansetron (ZOFRAN) tablet 4 mg  4 mg Oral Q6H PRN Mansy, Jan A, MD       Or  . ondansetron [facility]) injection 4 mg  4 mg Intravenous Q6H PRN Mansy, Jan A, MD   4 mg at 10/26/22 2139  . oxyCODONE-acetaminophen (PERCOCET/ROXICET) 5-325 MG per tablet 1 tablet  1 tablet Oral Q6H PRN Mansy, Vernetta Honey, MD   1 tablet at 10/27/22 1343  . pantoprazole (PROTONIX) EC tablet 40 mg  40 mg Oral BID Charise Killian, MD   40 mg at 10/27/22 0815  . potassium chloride SA (KLOR-CON M) CR tablet 20 mEq  20 mEq Oral BID PRN Mansy, Jan A, MD      . pregabalin (LYRICA) capsule 150 mg  150 mg Oral TID Mansy, Jan A, MD   150 mg at 10/27/22 0814  . senna-docusate (Senokot-S) tablet 2 tablet  2 tablet Oral Daily PRN Mansy, Jan A, MD      . sodium chloride flush (NS) 0.9 % injection 10-40 mL  10-40 mL Intracatheter Q12H Charise Killian, MD   10 mL at 10/27/22 1453  . sodium chloride flush (NS) 0.9 % injection 10-40 mL  10-40 mL Intracatheter PRN Charise Killian, MD      . tamsulosin [facility]) capsule 0.4 mg  0.4 mg Oral QPC breakfast Mansy, Jan A, MD   0.4 mg at 10/27/22 0865  . traZODone (DESYREL) tablet 25 mg  25 mg Oral QHS PRN Mansy, Jan A, MD   25 mg at 10/24/22 2246    Musculoskeletal: Strength & Muscle Tone: decreased Gait & Station:  Did not assess  Patient leans: N/A            Psychiatric Specialty Exam:  Presentation  General Appearance: No data recorded Eye Contact:No data  recorded Speech:No data recorded Speech Volume:No data recorded Handedness:No data recorded  Mood and Affect  Mood:No data recorded Affect:No data recorded  Thought Process  Thought Processes:No data recorded Descriptions of Associations:No data recorded Orientation:No data recorded Thought Content:No data recorded History of Schizophrenia/Schizoaffective disorder:No data recorded Duration of Psychotic Symptoms:No data recorded Hallucinations:No data recorded Ideas of Reference:No data recorded Suicidal Thoughts:No data recorded Homicidal Thoughts:No data recorded  Sensorium  Memory:No data recorded Judgment:No data recorded Insight:No data recorded  Executive Functions  Concentration:No data recorded Attention Span:No data recorded Recall:No data recorded Fund of Knowledge:No data recorded Language:No data recorded  Psychomotor Activity  Psychomotor Activity:No data recorded  Assets  Assets:No data recorded  Sleep  Sleep:No data recorded  Physical Exam: Physical Exam Vitals and nursing note reviewed.  Constitutional:      Appearance: He is obese.  HENT:     Head: Normocephalic.     Nose: Nose normal.  Pulmonary:     Effort: Pulmonary effort is normal. No respiratory distress.  Musculoskeletal:        General: Normal range of motion.     Cervical back: Normal range of motion.  Neurological:     Mental Status: He is alert and oriented to person, place, and time.   Review of Systems  Respiratory:  Positive for cough.   Cardiovascular:  Negative for chest pain.   Blood pressure (!) 150/71, pulse 86, temperature 98.2 F (36.8 C), temperature source Oral, resp. rate 16, height 5\' 11"  (1.803 m), weight (!) 170 kg, SpO2 100%. Body mass index is 52.27 kg/m.  Treatment Plan Summary: Daily contact with patient to assess and evaluate symptoms and progress in treatment, Medication management, and Plan :    Medication Management: -Duloxetine 30 mg     Disposition: No evidence of imminent risk to self or others at present.   Patient does not meet criteria for psychiatric inpatient admission. Supportive therapy provided about ongoing stressors.  Norma Fredrickson, NP 10/27/2022 3:44 PM

## 2022-10-27 NOTE — Evaluation (Signed)
Physical Therapy Evaluation Patient Details Name: Russell [name] MRN: [id number] DOB: 1965/11/01 Today's Date: 10/27/2022  History of Present Illness  Russell [name] is a 57 y.o. Caucasian male with medical history significant for combined systolic and diastolic CHF, right BKA, anxiety and depression, type 2 diabetes mellitus, hypertension, dyslipidemia and morbid obesity, who presented to the emergency room with acute onset of dyspnea.  The patient was just admitted from 8/13 till 8/24 acute blood loss anemia with associated upper GI bleeding and hypovolemic shock.  His EGD showed gastritis and nonbleeding gastric ulcers.  His colonoscopy showed no active bleeding.  He received a total of 4 units of PRBCs during his hospitalization as well as IV iron. Current MD assessment includes: Acute blood loss anemia, secondary to upper GI bleed.  Clinical Impression  Pt was pleasant and motivated to participate during the session and put forth good effort throughout. Pt is currently CGA for transfers with BRW, utilizing pt's self selected strategy. Once standing pt able to take a few sids steps to [facility], noted slightly labored breathing but pt has no concerns, Hr and SpO2 remained within functional limits; on 3L O2. Upon discussion with pt, he does expressing feeling depressed pt would like to speak with someone; notified MD for consultation of associated resources. Pt will benefit from continued PT services upon discharge to safely address deficits listed in patient problem list for decreased caregiver assistance and eventual return to PLOF.           If plan is discharge home, recommend the following: Assist for transportation;A little help with bathing/dressing/bathroom;Help with stairs or ramp for entrance;A little help with walking and/or transfers   Can travel by private vehicle   No    Equipment Recommendations Other (comment) (TBD at next venue of care)  Recommendations for Other Services        Functional Status Assessment Patient has had a recent decline in their functional status and demonstrates the ability to make significant improvements in function in a reasonable and predictable amount of time.     Precautions / Restrictions Precautions Precautions: Fall Required Braces or Orthoses: Other Brace Other Brace: R prosthetic in room Restrictions Weight Bearing Restrictions: No (prior R BKA) RLE Weight Bearing: Weight bearing as tolerated LLE Weight Bearing: Weight bearing as tolerated      Mobility  Bed Mobility Overal bed mobility: Modified Independent             General bed mobility comments: Pt sitting EOB at start of session, ended in same position.    Transfers Overall transfer level: Needs assistance Equipment used: Rolling walker (2 wheels) Transfers: Sit to/from Stand Sit to Stand: Contact guard assist           General transfer comment: Is Ind for readjustments and scooting when sitting. Had prosthesis donned, able to stand up CGA to BRW minimizing WB through L knee. Reports he has walked with nursing to bathroom.    Ambulation/Gait Ambulation/Gait assistance: Contact guard assist Gait Distance (Feet): 4 Feet Assistive device: Rolling walker (2 wheels) (Bariatric) Gait Pattern/deviations: Shuffle Gait velocity: decreased     General Gait Details: Pt taking few side step towards HOB, wide BoS with slightly foward flexed posture once standing, pt needing cues for redirect initially to sty on task with side stepping.  Stairs            Wheelchair Mobility     Tilt Bed    Modified Rankin (Stroke Patients Only)  Balance Overall balance assessment: Needs assistance Sitting-balance support: Feet supported Sitting balance-Leahy Scale: Good     Standing balance support: Bilateral upper extremity supported, During functional activity, Reliant on assistive device for balance Standing balance-Leahy Scale: Poor Standing balance  comment: Able to stand, manage balance but reliant on BRW                             Pertinent Vitals/Pain Pain Assessment Pain Assessment: 0-10 Pain Score: 5  Pain Location: L knee Pain Descriptors / Indicators: Aching, Discomfort, Grimacing Pain Intervention(s): Monitored during session    Home Living Family/patient expects to be discharged to:: Private residence Living Arrangements: Alone Available Help at Discharge: Family;Friend(s);Available PRN/intermittently Type of Home: Apartment Home Access: Level entry       Home Layout: One level Home Equipment: Rollator (4 wheels);Wheelchair - power;Shower seat Additional Comments: pt expressed to PT concerns of mold in his apt, mentions that nothing gets fixed there; wishing to live closer to family.    Prior Function Prior Level of Function : Independent/Modified Independent             Mobility Comments: sleeps in recliner chair, uses pwc for mobility, ambulates short (~10 feet) distances with rollator and prosthesis ADLs Comments: uses microwave for cooking, does not drive due to visision impairments, and endorses frequent burns on hands if uses stove (loss of sensation)     Extremity/Trunk Assessment   Upper Extremity Assessment Upper Extremity Assessment: Defer to OT evaluation    Lower Extremity Assessment Lower Extremity Assessment: Generalized weakness;LLE deficits/detail LLE Deficits / Details: Noting L kee pain that has been worsening over the past few weeks LLE Sensation: history of peripheral neuropathy       Communication   Communication Communication: No apparent difficulties  Cognition Arousal: Alert Behavior During Therapy: WFL for tasks assessed/performed Overall Cognitive Status: Within Functional Limits for tasks assessed                                 General Comments: pt made various comments around the subject, reports he is depressed. Notified MD for consultation  of appropriate resources, also notified that pt is also interested in dietician due to weight loss goals.        General Comments      Exercises     Assessment/Plan    PT Assessment Patient needs continued PT services  PT Problem List Decreased strength;Decreased range of motion;Decreased activity tolerance;Decreased balance;Decreased mobility;Decreased coordination       PT Treatment Interventions DME instruction;Gait training;Stair training;Functional mobility training;Therapeutic activities;Therapeutic exercise;Balance training    PT Goals (Current goals can be found in the Care Plan section)  Acute Rehab PT Goals Patient Stated Goal: get left leg feeling better PT Goal Formulation: With patient Time For Goal Achievement: 11/09/22 Potential to Achieve Goals: Fair    Frequency Min 1X/week     Co-evaluation               AM-PAC PT "6 Clicks" Mobility  Outcome Measure Help needed turning from your back to your side while in a flat bed without using bedrails?: A Little Help needed moving from lying on your back to sitting on the side of a flat bed without using bedrails?: A Little Help needed moving to and from a bed to a chair (including a wheelchair)?: A Little Help needed standing up from  a chair using your arms (e.g., wheelchair or bedside chair)?: A Little Help needed to walk in hospital room?: A Little Help needed climbing 3-5 steps with a railing? : A Lot 6 Click Score: 17    End of Session Equipment Utilized During Treatment: Gait belt Activity Tolerance: Patient limited by pain;Patient tolerated treatment well Patient left: in bed;Other (comment);with call bell/phone within reach;with bed alarm set (seated on EOB per pt preference) Nurse Communication: Mobility status PT Visit Diagnosis: Muscle weakness (generalized) (M62.81);Difficulty in walking, not elsewhere classified (R26.2)    Time: 5366-4403 PT Time Calculation (min) (ACUTE ONLY): 29  min   Charges:                 Cecile Sheerer, SPT 10/27/22, 2:55 PM
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
## 2022-10-28 DIAGNOSIS — D62 Acute posthemorrhagic anemia: Secondary | ICD-10-CM | POA: Diagnosis not present

## 2022-10-28 DIAGNOSIS — F331 Major depressive disorder, recurrent, moderate: Secondary | ICD-10-CM | POA: Insufficient documentation

## 2022-10-28 LAB — GLUCOSE, CAPILLARY
Glucose-Capillary: 125 mg/dL — ABNORMAL HIGH (ref 70–99)
Glucose-Capillary: 137 mg/dL — ABNORMAL HIGH (ref 70–99)
Glucose-Capillary: 173 mg/dL — ABNORMAL HIGH (ref 70–99)
Glucose-Capillary: 182 mg/dL — ABNORMAL HIGH (ref 70–99)
Glucose-Capillary: 201 mg/dL — ABNORMAL HIGH (ref 70–99)
Glucose-Capillary: 203 mg/dL — ABNORMAL HIGH (ref 70–99)
Glucose-Capillary: 205 mg/dL — ABNORMAL HIGH (ref 70–99)

## 2022-10-28 LAB — CBC
HCT: 23.5 % — ABNORMAL LOW (ref 39.0–52.0)
Hemoglobin: 7.2 g/dL — ABNORMAL LOW (ref 13.0–17.0)
MCH: 25.7 pg — ABNORMAL LOW (ref 26.0–34.0)
MCHC: 30.6 g/dL (ref 30.0–36.0)
MCV: 83.9 fL (ref 80.0–100.0)
Platelets: 199 10*3/uL (ref 150–400)
RBC: 2.8 MIL/uL — ABNORMAL LOW (ref 4.22–5.81)
RDW: 14.8 % (ref 11.5–15.5)
WBC: 4.8 10*3/uL (ref 4.0–10.5)
nRBC: 1.1 % — ABNORMAL HIGH (ref 0.0–0.2)

## 2022-10-28 LAB — BASIC METABOLIC PANEL
Anion gap: 6 (ref 5–15)
BUN: 12 mg/dL (ref 6–20)
CO2: 25 mmol/L (ref 22–32)
Calcium: 7.8 mg/dL — ABNORMAL LOW (ref 8.9–10.3)
Chloride: 102 mmol/L (ref 98–111)
Creatinine, Ser: 0.92 mg/dL (ref 0.61–1.24)
GFR, Estimated: >60 mL/min (ref >60)
Glucose, Bld: 246 mg/dL — ABNORMAL HIGH (ref 70–99)
Potassium: 4.3 mmol/L (ref 3.5–5.1)
Sodium: 133 mmol/L — ABNORMAL LOW (ref 135–145)

## 2022-10-28 MED ORDER — MAGNESIUM HYDROXIDE 400 MG/5ML PO SUSP
15.0000 mL | Freq: Every day | ORAL | Status: DC | PRN
  Administered 2022-10-28: 15 mL via ORAL
  Filled 2022-10-28 (×2): qty 30

## 2022-10-28 MED ORDER — FUROSEMIDE 40 MG PO TABS
40.0000 mg | ORAL_TABLET | Freq: Two times a day (BID) | ORAL | Status: DC
  Administered 2022-10-28: 40 mg via ORAL
  Filled 2022-10-28 (×2): qty 1

## 2022-10-28 NOTE — Inpatient Diabetes Management (Signed)
Inpatient Diabetes Program Recommendations  AACE/ADA: New Consensus Statement on Inpatient Glycemic Control (2015)  Target Ranges:  Prepandial:   less than 140 mg/dL      Peak postprandial:   less than 180 mg/dL (1-2 hours)      Critically ill patients:  140 - 180 mg/dL   Lab Results  Component Value Date   GLUCAP 205 (H) 10/28/2022   HGBA1C 12.0 (H) 10/13/2022    Review of Glycemic Control  Latest Reference Range & Units 10/27/22 19:51 10/27/22 21:06 10/28/22 00:27 10/28/22 04:14 10/28/22 08:17  Glucose-Capillary 70 - 99 mg/dL 253 (H) 664 (H) 403 (H) 203 (H) 205 (H)   Diabetes history: DM 2 Outpatient Diabetes medications:  Levemir 42 units bid Regular insulin 15 units tid with meals Current orders for Inpatient glycemic control:  Levemir 42 units bid Novolog 0-9 units q 4 hours Inpatient Diabetes Program Recommendations:    Note patient has PO diet ordered.  Consider reducing Novolog correction too tid with meals and HS (instead of q 4 hours) and add Novolog meal coverage 6 units tid with meals (hold if patient eats less than 50% or NPO).     Thanks,  Beryl Meager, RN, BC-ADM Inpatient Diabetes Coordinator Pager [phone] (8a-5p)

## 2022-10-28 NOTE — Care Management Important Message (Signed)
Important Message  Patient Details  Name: Russell [name] MRN: [id number] Date of Birth: 1965-07-06   Medicare Important Message Given:  Yes     [name] 10/28/2022, 2:53 PM

## 2022-10-28 NOTE — Progress Notes (Addendum)
Physical Therapy Treatment Patient Details Name: Russell [name] MRN: [id number] DOB: 07-Sep-1965 Today's Date: 10/28/2022   History of Present Illness BALDO GERSHON is a 57 y.o. Caucasian male with medical history significant for combined systolic and diastolic CHF, right BKA, anxiety and depression, type 2 diabetes mellitus, hypertension, dyslipidemia and morbid obesity, who presented to the emergency room with acute onset of dyspnea.  The patient was just admitted from 8/13 till 8/24 acute blood loss anemia with associated upper GI bleeding and hypovolemic shock.  His EGD showed gastritis and nonbleeding gastric ulcers.  His colonoscopy showed no active bleeding.  He received a total of 4 units of PRBCs during his hospitalization as well as IV iron. Current MD assessment includes: Acute blood loss anemia, secondary to upper GI bleed.    PT Comments  Pt was pleasant and motivated to participate during the session and put forth good effort throughout. Pt found sitting EOB. Pt able to stand with CGA, with pt utilizing self-selected strategy. Pt able to amb into hallway using BRW with CGA and chair follow. Pt ambulating with self selected gait pattern with moderate WB through pt's UE's; towards end of bout pt taking heavier breaths, stopped ambulation per pt preference, reports R shoulder starting to hurt and UE's becoming weak. Pt's vitals taken once seated with BP of 167/81 mmHg and SpO2 of 96%. Pt will benefit from continued PT services upon discharge to safely address deficits listed in patient problem list for decreased caregiver assistance and eventual return to PLOF.    If plan is discharge home, recommend the following: Assist for transportation;A little help with bathing/dressing/bathroom;Help with stairs or ramp for entrance;A little help with walking and/or transfers   Can travel by private vehicle     No  Equipment Recommendations  Other (comment) (TBD at next venue of care)     Recommendations for Other Services       Precautions / Restrictions Precautions Precautions: Fall Required Braces or Orthoses: Other Brace Other Brace: R prosthetic in room Restrictions Weight Bearing Restrictions: No     Mobility  Bed Mobility Overal bed mobility: Modified Independent             General bed mobility comments: Pt sitting EOB at start of session, ended in recliner    Transfers Overall transfer level: Needs assistance   Transfers: Sit to/from Stand Sit to Stand: Contact guard assist           General transfer comment: Pt donned prosthesis, able to stand with CGA to BRW, minimizing WB through L knee, using bed rails for support.    Ambulation/Gait Ambulation/Gait assistance: Contact guard assist Gait Distance (Feet): 30 Feet Assistive device: Rolling walker (2 wheels) (Bariatric) Gait Pattern/deviations: Step-through pattern, Decreased stride length, Decreased step length - right, Decreased step length - left, Decreased stance time - right Gait velocity: decreased     General Gait Details: Pt walking with forward flex posture with moderate WB through UE's on BRW, pt able to walk with self selected gait pattern into hall, towards end of bout pt taking heavier breaths, stopped ambulation per pt preference, reports R shoulder starting to hurt and UE's becoming weak.   Stairs             Wheelchair Mobility     Tilt Bed    Modified Rankin (Stroke Patients Only)       Balance Overall balance assessment: Needs assistance Sitting-balance support: Feet supported Sitting balance-Leahy Scale: Good  Standing balance support: Bilateral upper extremity supported, During functional activity, Reliant on assistive device for balance Standing balance-Leahy Scale: Fair Standing balance comment: Able to stand, manage balance but reliant on BRW                            Cognition Arousal: Alert Behavior During Therapy: WFL for  tasks assessed/performed Overall Cognitive Status: Within Functional Limits for tasks assessed                                          Exercises      General Comments        Pertinent Vitals/Pain Pain Assessment Pain Assessment: Faces Faces Pain Scale: Hurts a little bit Pain Location: L knee Pain Descriptors / Indicators: Aching, Discomfort, Grimacing Pain Intervention(s): Monitored during session    Home Living                          Prior Function            PT Goals (current goals can now be found in the care plan section) Progress towards PT goals: Progressing toward goals    Frequency    Min 1X/week      PT Plan      Co-evaluation              AM-PAC PT "6 Clicks" Mobility   Outcome Measure  Help needed turning from your back to your side while in a flat bed without using bedrails?: A Little Help needed moving from lying on your back to sitting on the side of a flat bed without using bedrails?: A Little Help needed moving to and from a bed to a chair (including a wheelchair)?: A Little Help needed standing up from a chair using your arms (e.g., wheelchair or bedside chair)?: A Little Help needed to walk in hospital room?: A Little Help needed climbing 3-5 steps with a railing? : A Lot 6 Click Score: 17    End of Session Equipment Utilized During Treatment: Gait belt Activity Tolerance: Patient tolerated treatment well Patient left: in chair;with chair alarm set;with call bell/phone within reach Nurse Communication: Mobility status PT Visit Diagnosis: Muscle weakness (generalized) (M62.81);Difficulty in walking, not elsewhere classified (R26.2)     Time: 1610-9604 PT Time Calculation (min) (ACUTE ONLY): 27 min  Charges:                            Cecile Sheerer, SPT 10/28/22, 4:23 PM  This entire session was performed under direct supervision and direction of a licensed therapist/therapist assistant. I  have personally read, edited and approve of the note as written.  Loran Senters, DPT

## 2022-10-28 NOTE — Consult Note (Signed)
Psychiatry attempted to round on patient. Upon approach, patient observed sleeping soundly in a recliner chair at the bedside. Rise and fall of chest observed, indicating normal respiration.    Pistol Kessenich H. Willeen Cass, NP 10/28/2022

## 2022-10-28 NOTE — TOC PASRR Note (Signed)
RE: Malo Prayer Date of Birth: 11/02/1965 Date: 10/28/22      To Whom It May Concern:   Please be advised that the above-named patient will require a short-term nursing home stay - anticipated 30 days or less for rehabilitation and strengthening.  The plan is for return home

## 2022-10-28 NOTE — Progress Notes (Signed)
OT Cancellation Note  Patient Details Name: Russell [name] MRN: [id number] DOB: 06/06/1965   Cancelled Treatment:    Reason Eval/Treat Not Completed: Other (comment) (OT enters room, pt seated EOB, on phone with family. Pt requests OT return later after conversation. OT will re-attempt as able.)  Lise Auer Russell [name] 10/28/2022, 4:28 PM

## 2022-10-28 NOTE — TOC Progression Note (Signed)
Transition of Care [facility]) - Progression Note    Patient Details  Name: [name] MRN: [id number] Date of Birth: January 13, 1966  Transition of Care [facility]) CM/SW Contact  Truddie Hidden, RN Phone Number: 10/28/2022, 9:38 AM  Clinical Narrative:    Bed offers pending Patient [name] PASSR pending H&P, signed FL2, and progress notes submitted to [place] PASSR as requested   Expected Discharge Plan: Home/Self Care Barriers to Discharge: Continued Medical Work up  Expected Discharge Plan and Services       Living arrangements for the past 2 months: Single Family Home                                       Social Determinants of Health (SDOH) Interventions SDOH Screenings   Food Insecurity: No Food Insecurity (10/24/2022)  Housing: Low Risk  (10/24/2022)  Transportation Needs: No Transportation Needs (10/24/2022)  Utilities: Not At Risk (10/24/2022)  Financial Resource Strain: Medium Risk (06/17/2022)   Received from [facility]  Tobacco Use: Low Risk  (10/26/2022)    Readmission Risk Interventions    10/25/2022    3:55 PM  Readmission Risk Prevention Plan  Transportation Screening Complete  PCP or Specialist Appt within 3-5 Days Complete  HRI or Home Care Consult Complete  Social Work Consult for Recovery Care Planning/Counseling Complete  Palliative Care Screening Not Applicable  Medication Review [employer]) Complete

## 2022-10-28 NOTE — Progress Notes (Signed)
Progress Note   Patient: Russell [name]:[id number] DOB: 1965-05-01 DOA: 10/23/2022     4 DOS: the patient was seen and examined on 10/28/2022   Brief hospital course:  Russell [name] is a 57 y.o. Caucasian male with medical history significant for combined systolic and diastolic CHF, right BKA, anxiety and depression, type 2 diabetes mellitus, hypertension, dyslipidemia and morbid obesity, who presented to the emergency room with acute onset of dyspnea.  The patient was just admitted from 8/13 till 8/24 acute blood loss anemia with associated upper GI bleeding and hypovolemic shock.  His EGD showed gastritis and nonbleeding gastric ulcers.  His colonoscopy showed no active bleeding.  He received a total of 4 units of PRBCs during his hospitalization as well as IV iron.  His hemoglobin was 8.3 on 8/18.  He required a brief IV Levophed for his hypovolemic shock.  He was placed on IV insulin drip for hyperosmolar hyperglycemic state.  He currently denies any nausea or vomiting or abdominal pain or heartburn.  He did not notice any melena or bright red bleeding per rectum.  No dysuria, oliguria or hematuria or flank pain.  No chest pain or palpitations.  No cough or wheezing or hemoptysis.  No other bleeding diathesis.  He is stated that this dyspnea started from the night he was discharged home.  He took 2 aspirin that night for headache.  He has not taken any more aspirin.  He denied taking Eliquis.     ED Course: When he came to the ER, BP was 103/41 with otherwise normal vital signs.  Labs revealed hemoglobin of 3.9 hematocrit 13 with MCH of 25.8.  High sensitive troponin was 6 and later 7.  BNP was 190.6.  CMP showed sodium 132 potassium 5, glucose 313 and BUN 51 with a creatinine 1.12.  Magnesium was 2.5 and albumin 2.5 with total protein of 5.3.  Beta-hydroxybutyrate was normal at 0.21. EKG as reviewed by me : EKG showed accelerated junctional rhythm with a rate of 72 with nonspecific  interventricular conduction delay. Imaging: Portable chest x-ray showed stable cardiomegaly and vascular congestion and improved left lung base opacity.   The patient was typed and crossmatch and will be transfused 2 units of packed red blood cells.  He was given 4 mg of IV morphine sulfate, 80 mg of IV Protonix bolus followed by IV drip.  He will be admitted to an observation progressive unit bed for further evaluation and management.         Assessment and Plan:   Acute blood loss anemia:  secondary to recurrent upper GI bleed from NSAID induced peptic ulcer disease Status post transfusion of 6 units of packed RBC Had an upper endoscopy with healed gastric ulcers.  Nonbleeding superficial duodenal ulcers Continue to hold aspirin, plavix.  Monitor H&H closely       Hyponatremia:  Resolved     HLD:  continue on statin    DM2: likely poorly controlled.  Blood sugars are stable Continue consistent carbohydrate diet Continue on levemir,  SSI w/ accuchecks    Peripheral arterial disease /Hx of right BKA:  continue w/ supportive care    Peripheral neuropathy:  continue on home dose of pregabalin    Depression: severity unknown.  Continue on home dose of duloxetine. Holding nortriptyline  Consult psych   Morbid obesity: BMI 52.2.  Complicates overall prognosis and care   Generalized weakness:  PT/OT consulted.  They recommend skilled nursing facility on discharge  Subjective: Seen and examined at the bedside and complains of inability to void.  Had an In-N-Out cath with drainage of a liter of urine.  Complains of generalized puffiness  Physical Exam: Vitals:   10/27/22 1721 10/27/22 2120 10/28/22 0043 10/28/22 0618  BP: (!) 149/77 (!) 142/66 (!) 155/66 (!) 154/73  Pulse:  98 92 89  Resp: 16  18 18   Temp: (!) 100.9 F (38.3 C) 100 F (37.8 C) 99.3 F (37.4 C) 99.6 F (37.6 C)  TempSrc: Oral Oral Oral   SpO2: 97% 97% 95% 93%  Weight:       Height:       General exam: appears comfortable. Morbidly obese Respiratory system: decreased breath sounds b/l  Cardiovascular system: S1 & S2+. No rubs or clicks  Gastrointestinal system: abd is soft, NT, obese & normal bowel sounds  Central nervous system: alert & oriented. Moves all extremities  Psychiatry: judgement and insight appears normal. Flat mood and affect   Data Reviewed: Hemoglobin 7.2 There are no new results to review at this time.  Family Communication: Plan of care discussed with patient in detail  Disposition: Status is: Inpatient Remains inpatient appropriate because: Awaiting discharge to SNF  Planned Discharge Destination: Skilled nursing facility    Time spent: 32 minutes  Author: Lucile Shutters, MD 10/28/2022 2:54 PM  For on call review [email or web address].

## 2022-10-29 DIAGNOSIS — D62 Acute posthemorrhagic anemia: Secondary | ICD-10-CM | POA: Diagnosis not present

## 2022-10-29 LAB — GLUCOSE, CAPILLARY
Glucose-Capillary: 136 mg/dL — ABNORMAL HIGH (ref 70–99)
Glucose-Capillary: 140 mg/dL — ABNORMAL HIGH (ref 70–99)
Glucose-Capillary: 149 mg/dL — ABNORMAL HIGH (ref 70–99)
Glucose-Capillary: 183 mg/dL — ABNORMAL HIGH (ref 70–99)
Glucose-Capillary: 222 mg/dL — ABNORMAL HIGH (ref 70–99)

## 2022-10-29 MED ORDER — CHLORHEXIDINE GLUCONATE CLOTH 2 % EX PADS
6.0000 | MEDICATED_PAD | Freq: Every day | CUTANEOUS | Status: DC
  Administered 2022-10-29 – 2022-11-08 (×10): 6 via TOPICAL

## 2022-10-29 MED ORDER — SALINE SPRAY 0.65 % NA SOLN
1.0000 | NASAL | Status: DC | PRN
  Administered 2022-10-29 – 2022-11-01 (×2): 1 via NASAL
  Filled 2022-10-29 (×2): qty 44

## 2022-10-29 NOTE — Progress Notes (Signed)
Progress Note   Patient: Russell [name] VWU:[id number] DOB: 1965-04-30 DOA: 10/23/2022     5 DOS: the patient was seen and examined on 10/29/2022   Brief hospital course:  Russell [name] is a 57 y.o. Caucasian male with medical history significant for combined systolic and diastolic CHF, right BKA, anxiety and depression, type 2 diabetes mellitus, hypertension, dyslipidemia and morbid obesity, who presented to the emergency room with acute onset of dyspnea.  The patient was just admitted from 8/13 till 8/24 acute blood loss anemia with associated upper GI bleeding and hypovolemic shock.  His EGD showed gastritis and nonbleeding gastric ulcers.  His colonoscopy showed no active bleeding.  He received a total of 4 units of PRBCs during his hospitalization as well as IV iron.  His hemoglobin was 8.3 on 8/18.  He required a brief IV Levophed for his hypovolemic shock.  He was placed on IV insulin drip for hyperosmolar hyperglycemic state.  He currently denies any nausea or vomiting or abdominal pain or heartburn.  He did not notice any melena or bright red bleeding per rectum.  No dysuria, oliguria or hematuria or flank pain.  No chest pain or palpitations.  No cough or wheezing or hemoptysis.  No other bleeding diathesis.  He is stated that this dyspnea started from the night he was discharged home.  He took 2 aspirin that night for headache.  He has not taken any more aspirin.  He denied taking Eliquis.     ED Course: When he came to the ER, BP was 103/41 with otherwise normal vital signs.  Labs revealed hemoglobin of 3.9 hematocrit 13 with MCH of 25.8.  High sensitive troponin was 6 and later 7.  BNP was 190.6.  CMP showed sodium 132 potassium 5, glucose 313 and BUN 51 with a creatinine 1.12.  Magnesium was 2.5 and albumin 2.5 with total protein of 5.3.  Beta-hydroxybutyrate was normal at 0.21. EKG as reviewed by me : EKG showed accelerated junctional rhythm with a rate of 72 with nonspecific  interventricular conduction delay. Imaging: Portable chest x-ray showed stable cardiomegaly and vascular congestion and improved left lung base opacity.   The patient was typed and crossmatch and will be transfused 2 units of packed red blood cells.  He was given 4 mg of IV morphine sulfate, 80 mg of IV Protonix bolus followed by IV drip.  He will be admitted to an observation progressive unit bed for further evaluation and management.         Assessment and Plan:  Acute blood loss anemia:  secondary to recurrent upper GI bleed from NSAID induced peptic ulcer disease Status post transfusion of 6 units of packed RBC Had an upper endoscopy with healed gastric ulcers.  Nonbleeding superficial duodenal ulcers Continue to hold aspirin, plavix.  Monitor H&H closely and transfuse as needed    Urinary retention Patient unable to void and had 2 In-N-Out catheterization with over a liter of urine drained Will place a Foley catheter Start patient on Flomax 0.4 mg daily        Hyponatremia:  Resolved      HLD:  continue on statin     DM2: likely poorly controlled.  Blood sugars are stable Continue consistent carbohydrate diet Continue on levemir,  SSI w/ accuchecks     Peripheral arterial disease /Hx of right BKA:  continue w/ supportive care     Peripheral neuropathy:  continue on home dose of pregabalin    Depression: severity  unknown.  Continue on home dose of duloxetine. Holding nortriptyline  Consult psych   Morbid obesity: BMI 52.2.  Complicates overall prognosis and care   Generalized weakness:  PT/OT consulted.  They recommend skilled nursing facility on discharge             Subjective: Patient is seen and examined at the bedside.  Had a large bowel movement with no obvious frank blood.  Physical Exam: Vitals:   10/28/22 2343 10/29/22 0405 10/29/22 0803 10/29/22 1143  BP: (!) 113/46 (!) 126/50 (!) 102/56 118/74  Pulse: 95 86 87 71  Resp:   16 16   Temp: 99.2 F (37.3 C)  98.6 F (37 C) 98.6 F (37 C)  TempSrc: Oral  Oral Oral  SpO2: 93% (!) 89% 96% 100%  Weight:      Height:       General exam: appears comfortable. Morbidly obese Respiratory system: decreased breath sounds b/l  Cardiovascular system: S1 & S2+. No rubs or clicks  Gastrointestinal system: abd is soft, NT, obese & normal bowel sounds  Central nervous system: alert & oriented. Moves all extremities  Psychiatry: judgement and insight appears normal. Flat mood and affect    Data Reviewed:  There are no new results to review at this time.  Family Communication: Plan of care discussed with patient at the bedside.  He verbalizes understanding and agrees with the plan  Disposition: Status is: Inpatient Remains inpatient appropriate because: Awaiting placement  Planned Discharge Destination: Skilled nursing facility    Time spent: 32 minutes  Author: Lucile Shutters, MD 10/29/2022 1:44 PM  For on call review [email or web address].

## 2022-10-29 NOTE — Progress Notes (Signed)
Occupational Therapy Treatment Patient Details Name: Russell [name] MRN: [id number] DOB: Jul 13, 1965 Today's Date: 10/29/2022   History of present illness Russell [name] is a 57 y.o. Caucasian male with medical history significant for combined systolic and diastolic CHF, right BKA, anxiety and depression, type 2 diabetes mellitus, hypertension, dyslipidemia and morbid obesity, who presented to the emergency room with acute onset of dyspnea.  The patient was just admitted from 8/13 till 8/24 acute blood loss anemia with associated upper GI bleeding and hypovolemic shock.  His EGD showed gastritis and nonbleeding gastric ulcers.  His colonoscopy showed no active bleeding.  He received a total of 4 units of PRBCs during his hospitalization as well as IV iron. Current MD assessment includes: Acute blood loss anemia, secondary to upper GI bleed.   OT comments  Pt seen for OT treatment on this date. Upon arrival to room pt sitting EOB with prosthesis donned, reporting he just got back to bed after large BM, agreeable to OT Tx session. OT facilitated ADL management as described below. See ADL section for additional details regarding occupational performance. Pt continues to be functionally limited by decreased activity tolerance, resulting in SOB with activity and generalized weakness/deconditioning. Overall, setup for ADL session in seated, CGA for transfers. Education on recommended AE for support of continued ADL IND and energy conservation strategies. SpO2 on RA 95%, after transfer drops to 84%. SpO2 via [place] 3L given, and after PLB and relaxation strategies, increased back up to 95%. Pt return verbalizes understanding of education provided t/o session. Pt is progressing toward OT goals and continues to benefit from skilled OT services to maximize return to PLOF and minimize risk of future falls, injury, caregiver burden, and readmission. Will continue to follow POC as written. Discharge recommendation remains  appropriate.        If plan is discharge home, recommend the following:  A little help with walking and/or transfers;A little help with bathing/dressing/bathroom;Assistance with cooking/housework;Direct supervision/assist for medications management;Direct supervision/assist for financial management;Assist for transportation;Help with stairs or ramp for entrance   Equipment Recommendations  Other (comment)    Recommendations for Other Services      Precautions / Restrictions Precautions Precautions: Fall Restrictions Weight Bearing Restrictions: No RLE Weight Bearing:  (R le prosthesis)       Mobility Bed Mobility Overal bed mobility: Modified Independent                  Transfers Overall transfer level: Needs assistance Equipment used: Rolling walker (2 wheels) Transfers: Sit to/from Stand, Bed to chair/wheelchair/BSC Sit to Stand: Contact guard assist     Step pivot transfers: Contact guard assist     General transfer comment: Pt sitting EOB with prosthessi donned, uses bed rails for support, heavy reliance on RW and RLE for support to minimize WB on L knee     Balance Overall balance assessment: Needs assistance Sitting-balance support: Feet supported Sitting balance-Leahy Scale: Good     Standing balance support: Bilateral upper extremity supported, During functional activity, Reliant on assistive device for balance Standing balance-Leahy Scale: Fair Standing balance comment: heavy use of RW through BUE, cues for upright posture                           ADL either performed or assessed with clinical judgement   ADL Overall ADL's : Needs assistance/impaired Eating/Feeding: Set up   Grooming: Wash/dry hands;Wash/dry face;Sitting;Applying deodorant   Upper Body Bathing: Set  up;Sitting       Upper Body Dressing : Minimal assistance;Sitting Upper Body Dressing Details (indicate cue type and reason): assist to tie gown as pt's body habitus  limits ability to reach behind head                   General ADL Comments: Performing seated EOB ADL session focusing on grooming and bathing tasks. Pt easily SOB with activity, benefits from cues to perform PLB and take breaks as needed. OT educates on energy conservation strategies, and AE recommendations for continued dressing IND (sock aide, reachers, bottom aide for pericare after toileting, HH shower head while bathing, bidet, etc).    Extremity/Trunk Assessment Upper Extremity Assessment Upper Extremity Assessment: Generalized weakness   Lower Extremity Assessment Lower Extremity Assessment: Generalized weakness ([place] LLE)         Cognition Arousal: Alert Behavior During Therapy: WFL for tasks assessed/performed Overall Cognitive Status: Within Functional Limits for tasks assessed                                 General Comments: pt reported concerns of living situation, desire to have more support for family and overall depression-related symptoms              General Comments SpO2 on RA 95%, after transfer drops to 84%. SpO2 via [place] 3L given, and after PLB and relaxation strategies, increased back up to 95%    Pertinent Vitals/ Pain       Pain Assessment Pain Assessment: No/denies pain Pain Score: 0-No pain   Frequency  Min 1X/week        Progress Toward Goals  OT Goals(current goals can now be found in the care plan section)  Progress towards OT goals: Progressing toward goals  Acute Rehab OT Goals Patient Stated Goal: to be near family OT Goal Formulation: With patient Time For Goal Achievement: 11/10/22 Potential to Achieve Goals: Good  Plan         AM-PAC OT "6 Clicks" Daily Activity     Outcome Measure   Help from another person eating meals?: None Help from another person taking care of personal grooming?: None Help from another person toileting, which includes using toliet, bedpan, or urinal?: A Lot Help from another  person bathing (including washing, rinsing, drying)?: A Little Help from another person to put on and taking off regular upper body clothing?: A Little Help from another person to put on and taking off regular lower body clothing?: A Little 6 Click Score: 19    End of Session Equipment Utilized During Treatment: Gait belt;Oxygen;Rolling walker (2 wheels);Other (comment)  OT Visit Diagnosis: Muscle weakness (generalized) (M62.81);Other abnormalities of gait and mobility (R26.89)   Activity Tolerance Patient tolerated treatment well   Patient Left with call bell/phone within reach;with chair alarm set;in chair   Nurse Communication Mobility status        Time: 4098-1191 OT Time Calculation (min): 44 min  Charges: OT General Charges $OT Visit: 1 Visit OT Treatments $Self Care/Home Management : 23-37 mins $Therapeutic Activity: 8-22 mins  Arcenia Scarbro L. Donaldson Richter, OTR/L  10/29/22, 12:28 PM

## 2022-10-29 NOTE — Plan of Care (Signed)

## 2022-10-29 NOTE — TOC Progression Note (Addendum)
Transition of Care [facility]) - Progression Note    Patient Details  Name: Russell [name] MRN: [id number] Date of Birth: 1965/07/21  [facility]) CM/SW Contact  Marta Bouie Synetta Fail, LCSW Phone Number: 10/29/2022, 9:22 AM  Clinical Narrative:    30 day note uploaded to [facility].  1:30- PASRR is [id number] E. Expires 11/28/22  Expected Discharge Plan: Home/Self Care Barriers to Discharge: Continued Medical Work up  Expected Discharge Plan and Services       Living arrangements for the past 2 months: Single Family Home                                       Social Determinants of Health (SDOH) Interventions SDOH Screenings   Food Insecurity: No Food Insecurity (10/24/2022)  Housing: Low Risk  (10/24/2022)  Transportation Needs: No Transportation Needs (10/24/2022)  Utilities: Not At Risk (10/24/2022)  Financial Resource Strain: Medium Risk (06/17/2022)   Received from [facility]  Tobacco Use: Low Risk  (10/26/2022)    Readmission Risk Interventions    10/25/2022    3:55 PM  Readmission Risk Prevention Plan  Transportation Screening Complete  PCP or Specialist Appt within 3-5 Days Complete  HRI or Home Care Consult Complete  Social Work Consult for Recovery Care Planning/Counseling Complete  Palliative Care Screening Not Applicable  Medication Review [employer]) Complete

## 2022-10-29 NOTE — Plan of Care (Signed)
  Problem: Education: Goal: Ability to describe self-care measures that may prevent or decrease complications (Diabetes Survival Skills Education) will improve Outcome: Progressing   Problem: Cardiac: Goal: Ability to maintain an adequate cardiac output will improve Outcome: Progressing   Problem: Health Behavior/Discharge Planning: Goal: Ability to identify and utilize available resources and services will improve Outcome: Progressing Goal: Ability to manage health-related needs will improve Outcome: Progressing   Problem: Fluid Volume: Goal: Ability to achieve a balanced intake and output will improve Outcome: Progressing   Problem: Metabolic: Goal: Ability to maintain appropriate glucose levels will improve Outcome: Progressing   Problem: Nutritional: Goal: Maintenance of adequate nutrition will improve Outcome: Progressing Goal: Maintenance of adequate weight for body size and type will improve Outcome: Progressing   Problem: Urinary Elimination: Goal: Ability to achieve and maintain adequate renal perfusion and functioning will improve Outcome: Progressing   Problem: Fluid Volume: Goal: Ability to maintain a balanced intake and output will improve Outcome: Progressing   Problem: Nutritional: Goal: Progress toward achieving an optimal weight will improve Outcome: Progressing

## 2022-10-29 NOTE — Progress Notes (Signed)
Physical Therapy Treatment Patient Details Name: Russell [name] MRN: [id number] DOB: 22-Mar-1965 Today's Date: 10/29/2022   History of Present Illness Russell [name] is a 57 y.o. Caucasian male with medical history significant for combined systolic and diastolic CHF, right BKA, anxiety and depression, type 2 diabetes mellitus, hypertension, dyslipidemia and morbid obesity, who presented to the emergency room with acute onset of dyspnea.  The patient was just admitted from 8/13 till 8/24 acute blood loss anemia with associated upper GI bleeding and hypovolemic shock.  His EGD showed gastritis and nonbleeding gastric ulcers.  His colonoscopy showed no active bleeding.  He received a total of 4 units of PRBCs during his hospitalization as well as IV iron. Current MD assessment includes: Acute blood loss anemia, secondary to upper GI bleed.    PT Comments  Pt was pleasant and motivated to participate during the session and put forth good effort throughout. Pt required extra time and effort with heavy reliance on UE assist during sit to/from stand transfers from a slightly elevated seat height but no physical assistance needed.  Pt was able to amb three times during the session per below with slow, effortful cadence but with no overt LOB and with SpO2 and HR WNL on room air with HR ranging from upper 80s at rest to mid 90s after ambulation and SpO2 92-95% throughout.  Pt will benefit from continued PT services upon discharge to safely address deficits listed in patient problem list for decreased caregiver assistance and eventual return to PLOF.      If plan is discharge home, recommend the following: Assist for transportation;A little help with bathing/dressing/bathroom;Help with stairs or ramp for entrance;A little help with walking and/or transfers   Can travel by private vehicle     No  Equipment Recommendations  Other (comment) (TBD at next venue of care)    Recommendations for Other Services        Precautions / Restrictions Precautions Precautions: Fall Other Brace: RLE prosthetic in room Restrictions Weight Bearing Restrictions: No     Mobility  Bed Mobility               General bed mobility comments: NT, pt in recliner pre-post session    Transfers Overall transfer level: Needs assistance Equipment used: Rolling walker (2 wheels) Transfers: Sit to/from Stand Sit to Stand: Contact guard assist           General transfer comment: Heavy effort with increased time and use of UEs to come to standing but no physical assistance needed    Ambulation/Gait Ambulation/Gait assistance: Contact guard assist Gait Distance (Feet): 30 Feet x 1, 20 Feet x 2 Assistive device: Rolling walker (2 wheels) (bariatric) Gait Pattern/deviations: Step-through pattern, Decreased step length - right, Decreased step length - left, Trunk flexed Gait velocity: decreased     General Gait Details: Very slow cadence with short B step length and frequent 3-5 sec standing rest breaks required but generally steady with no overt LOB   Stairs             Wheelchair Mobility     Tilt Bed    Modified Rankin (Stroke Patients Only)       Balance Overall balance assessment: Needs assistance Sitting-balance support: Feet supported Sitting balance-Leahy Scale: Good     Standing balance support: Bilateral upper extremity supported, During functional activity, Reliant on assistive device for balance Standing balance-Leahy Scale: Fair  Cognition Arousal: Alert Behavior During Therapy: WFL for tasks assessed/performed Overall Cognitive Status: Within Functional Limits for tasks assessed                                          Exercises      General Comments        Pertinent Vitals/Pain Pain Assessment Pain Assessment: 0-10 Pain Score: 4  Pain Location: R hip Pain Descriptors / Indicators: Sore Pain  Intervention(s): Repositioned, Monitored during session, Premedicated before session    Home Living                          Prior Function            PT Goals (current goals can now be found in the care plan section) Progress towards PT goals: Progressing toward goals    Frequency    Min 1X/week      PT Plan      Co-evaluation              AM-PAC PT "6 Clicks" Mobility   Outcome Measure  Help needed turning from your back to your side while in a flat bed without using bedrails?: A Little Help needed moving from lying on your back to sitting on the side of a flat bed without using bedrails?: A Little Help needed moving to and from a bed to a chair (including a wheelchair)?: A Little Help needed standing up from a chair using your arms (e.g., wheelchair or bedside chair)?: A Little Help needed to walk in hospital room?: A Little Help needed climbing 3-5 steps with a railing? : A Lot 6 Click Score: 17    End of Session Equipment Utilized During Treatment: Gait belt Activity Tolerance: Patient tolerated treatment well Patient left: in chair;with chair alarm set;with call bell/phone within reach Nurse Communication: Mobility status;Other (comment) (Pt found and left with no SCD donned to LLE, nursing notified) PT Visit Diagnosis: Muscle weakness (generalized) (M62.81);Difficulty in walking, not elsewhere classified (R26.2)     Time: 1610-9604 PT Time Calculation (min) (ACUTE ONLY): 27 min  Charges:    $Gait Training: 23-37 mins PT General Charges $$ ACUTE PT VISIT: 1 Visit                     D. Scott Jameson Morrow PT, DPT 10/29/22, 4:07 PM

## 2022-10-30 DIAGNOSIS — D62 Acute posthemorrhagic anemia: Secondary | ICD-10-CM | POA: Diagnosis not present

## 2022-10-30 LAB — CBC
HCT: 22.4 % — ABNORMAL LOW (ref 39.0–52.0)
Hemoglobin: 6.8 g/dL — ABNORMAL LOW (ref 13.0–17.0)
MCH: 25.6 pg — ABNORMAL LOW (ref 26.0–34.0)
MCHC: 30.4 g/dL (ref 30.0–36.0)
MCV: 84.2 fL (ref 80.0–100.0)
Platelets: 191 10*3/uL (ref 150–400)
RBC: 2.66 MIL/uL — ABNORMAL LOW (ref 4.22–5.81)
RDW: 14.9 % (ref 11.5–15.5)
WBC: 3.5 10*3/uL — ABNORMAL LOW (ref 4.0–10.5)
nRBC: 1.4 % — ABNORMAL HIGH (ref 0.0–0.2)

## 2022-10-30 LAB — PREPARE RBC (CROSSMATCH)

## 2022-10-30 LAB — BASIC METABOLIC PANEL
Anion gap: 7 (ref 5–15)
BUN: 15 mg/dL (ref 6–20)
CO2: 27 mmol/L (ref 22–32)
Calcium: 7.8 mg/dL — ABNORMAL LOW (ref 8.9–10.3)
Chloride: 101 mmol/L (ref 98–111)
Creatinine, Ser: 0.93 mg/dL (ref 0.61–1.24)
GFR, Estimated: >60 mL/min (ref >60)
Glucose, Bld: 178 mg/dL — ABNORMAL HIGH (ref 70–99)
Potassium: 3.7 mmol/L (ref 3.5–5.1)
Sodium: 135 mmol/L (ref 135–145)

## 2022-10-30 LAB — GLUCOSE, CAPILLARY
Glucose-Capillary: 143 mg/dL — ABNORMAL HIGH (ref 70–99)
Glucose-Capillary: 170 mg/dL — ABNORMAL HIGH (ref 70–99)
Glucose-Capillary: 171 mg/dL — ABNORMAL HIGH (ref 70–99)
Glucose-Capillary: 180 mg/dL — ABNORMAL HIGH (ref 70–99)
Glucose-Capillary: 184 mg/dL — ABNORMAL HIGH (ref 70–99)
Glucose-Capillary: 209 mg/dL — ABNORMAL HIGH (ref 70–99)
Glucose-Capillary: 209 mg/dL — ABNORMAL HIGH (ref 70–99)

## 2022-10-30 MED ORDER — SODIUM CHLORIDE 0.9% IV SOLUTION: Freq: Once | INTRAVENOUS | Status: DC

## 2022-10-30 MED ORDER — FUROSEMIDE 10 MG/ML IJ SOLN
40.0000 mg | Freq: Once | INTRAMUSCULAR | Status: AC
  Administered 2022-10-30: 40 mg via INTRAVENOUS
  Filled 2022-10-30: qty 4

## 2022-10-30 NOTE — Progress Notes (Signed)
Progress Note   Patient: Russell [name] ZOX:[id number] DOB: 10-21-1965 DOA: 10/23/2022     6 DOS: the patient was seen and examined on 10/30/2022   Brief hospital course:  Russell [name] is a 57 y.o. Caucasian male with medical history significant for combined systolic and diastolic CHF, right BKA, anxiety and depression, type 2 diabetes mellitus, hypertension, dyslipidemia and morbid obesity, who presented to the emergency room with acute onset of dyspnea.  The patient was just admitted from 8/13 till 8/24 acute blood loss anemia with associated upper GI bleeding and hypovolemic shock.  His EGD showed gastritis and nonbleeding gastric ulcers.  His colonoscopy showed no active bleeding.  He received a total of 4 units of PRBCs during his hospitalization as well as IV iron.  His hemoglobin was 8.3 on 8/18.  He required a brief IV Levophed for his hypovolemic shock.  He was placed on IV insulin drip for hyperosmolar hyperglycemic state.  He currently denies any nausea or vomiting or abdominal pain or heartburn.  He did not notice any melena or bright red bleeding per rectum.  No dysuria, oliguria or hematuria or flank pain.  No chest pain or palpitations.  No cough or wheezing or hemoptysis.  No other bleeding diathesis.  He is stated that this dyspnea started from the night he was discharged home.  He took 2 aspirin that night for headache.  He has not taken any more aspirin.  He denied taking Eliquis.     ED Course: When he came to the ER, BP was 103/41 with otherwise normal vital signs.  Labs revealed hemoglobin of 3.9 hematocrit 13 with MCH of 25.8.  High sensitive troponin was 6 and later 7.  BNP was 190.6.  CMP showed sodium 132 potassium 5, glucose 313 and BUN 51 with a creatinine 1.12.  Magnesium was 2.5 and albumin 2.5 with total protein of 5.3.  Beta-hydroxybutyrate was normal at 0.21. EKG as reviewed by me : EKG showed accelerated junctional rhythm with a rate of 72 with nonspecific  interventricular conduction delay. Imaging: Portable chest x-ray showed stable cardiomegaly and vascular congestion and improved left lung base opacity.   The patient was typed and crossmatch and will be transfused 2 units of packed red blood cells.  He was given 4 mg of IV morphine sulfate, 80 mg of IV Protonix bolus followed by IV drip.  He will be admitted to an observation progressive unit bed for further evaluation and management.     Assessment and Plan:  Acute blood loss anemia:  Secondary to recurrent upper GI bleed from NSAID induced peptic ulcer disease Had an upper endoscopy with healed gastric ulcers.  Nonbleeding superficial duodenal ulcer Status post transfusion of 6 units of packed RBC Will transfuse 1 more unit of packed RBC due to hemoglobin of 6.8 Continue to hold aspirin, plavix.  Monitor H&H closely and transfuse as needed       Urinary retention Patient unable to void and had 2 In-N-Out catheterization with over a liter of urine drained Will place a Foley catheter Continue Flomax 0.4 mg daily        Hyponatremia:  Resolved      HLD:  continue on statin      DM2: likely poorly controlled.  Blood sugars are stable Continue consistent carbohydrate diet Continue on levemir,  SSI w/ accuchecks      Peripheral arterial disease /Hx of right BKA:  continue w/ supportive care      Peripheral  neuropathy:  continue on home dose of pregabalin    Depression: severity unknown.  Continue on home dose of duloxetine. Holding nortriptyline  Consult psych   Morbid obesity: BMI 52.2.  Complicates overall prognosis and care   Generalized weakness:  PT/OT consulted.  They recommend skilled nursing facility on discharge             Subjective: Patient is seen and examined at the bedside.  No new complaints  Physical Exam: Vitals:   10/30/22 0319 10/30/22 0853 10/30/22 1300 10/30/22 1334  BP: 113/71 128/61 124/68 (!) 116/49  Pulse: 73 74  69  Resp:  20 17 19 17   Temp: 97.7 F (36.5 C) 97.7 F (36.5 C) 97.7 F (36.5 C) 98 F (36.7 C)  TempSrc:   Oral Oral  SpO2: 99% 96% 91% 92%  Weight:      Height:       General exam: appears comfortable. Morbidly obese Respiratory system: decreased breath sounds b/l  Cardiovascular system: S1 & S2+. No rubs or clicks  Gastrointestinal system: abd is soft, NT, obese & normal bowel sounds  Central nervous system: alert & oriented. Moves all extremities  Psychiatry: judgement and insight appears normal. Flat mood and affect  Data Reviewed: Labs reviewed.  Hemoglobin 6.8 There are no new results to review at this time.  Family Communication: Plan of care discussed with patient in detail.  He verbalizes understanding and agrees with the plan.  Disposition: Status is: Inpatient Remains inpatient appropriate because: Continues to require blood transfusion  Planned Discharge Destination: Skilled nursing facility    Time spent: 35 minutes  Author: Lucile Shutters, MD 10/30/2022 3:20 PM  For on call review [email or web address].

## 2022-10-30 NOTE — TOC Progression Note (Addendum)
Transition of Care [facility]) - Progression Note    Patient Details  Name: Russell [name] MRN: [id number] Date of Birth: 10-27-1965  Transition of Care [facility]) CM/SW Contact  Liliana Cline, LCSW Phone Number: 10/30/2022, 11:59 AM  Clinical Narrative:    Spoke to patient. Patient strongly prefers a SNF in [place] for STR due to proximity to his family. [facility] declined and [facility] is pending. Patient states he lives very close to [facility]. CSW reached out to [place] [facility] to see if they would reconsider- left VM. Also reached out to [place] [facility] to see if they can accept patient- left VM.  Patient states if neither of these facilities can accept him, he is ok with extending bed search to surrounding areas - but strongly prefers [facility] because his family is not familiar with [place].   Expected Discharge Plan: Home/Self Care Barriers to Discharge: Continued Medical Work up  Expected Discharge Plan and Services       Living arrangements for the past 2 months: Single Family Home                                       Social Determinants of Health (SDOH) Interventions SDOH Screenings   Food Insecurity: No Food Insecurity (10/24/2022)  Housing: Low Risk  (10/24/2022)  Transportation Needs: No Transportation Needs (10/24/2022)  Utilities: Not At Risk (10/24/2022)  Financial Resource Strain: Medium Risk (06/17/2022)   Received from [facility]  Tobacco Use: Low Risk  (10/26/2022)    Readmission Risk Interventions    10/25/2022    3:55 PM  Readmission Risk Prevention Plan  Transportation Screening Complete  PCP or Specialist Appt within 3-5 Days Complete  HRI or Home Care Consult Complete  Social Work Consult for Recovery Care Planning/Counseling Complete  Palliative Care Screening Not Applicable  Medication Review [employer]) Complete

## 2022-10-30 NOTE — Plan of Care (Signed)

## 2022-10-31 DIAGNOSIS — D62 Acute posthemorrhagic anemia: Secondary | ICD-10-CM | POA: Diagnosis not present

## 2022-10-31 LAB — BPAM RBC
Blood Product Expiration Date: 202408312359
ISSUE DATE / TIME: 202408311311
Unit Type and Rh: 9500

## 2022-10-31 LAB — CBC
HCT: 23.5 % — ABNORMAL LOW (ref 39.0–52.0)
Hemoglobin: 7.3 g/dL — ABNORMAL LOW (ref 13.0–17.0)
MCH: 25.5 pg — ABNORMAL LOW (ref 26.0–34.0)
MCHC: 31.1 g/dL (ref 30.0–36.0)
MCV: 82.2 fL (ref 80.0–100.0)
Platelets: 193 10*3/uL (ref 150–400)
RBC: 2.86 MIL/uL — ABNORMAL LOW (ref 4.22–5.81)
RDW: 14.7 % (ref 11.5–15.5)
WBC: 4 10*3/uL (ref 4.0–10.5)
nRBC: 1.7 % — ABNORMAL HIGH (ref 0.0–0.2)

## 2022-10-31 LAB — GLUCOSE, CAPILLARY
Glucose-Capillary: 145 mg/dL — ABNORMAL HIGH (ref 70–99)
Glucose-Capillary: 149 mg/dL — ABNORMAL HIGH (ref 70–99)
Glucose-Capillary: 168 mg/dL — ABNORMAL HIGH (ref 70–99)
Glucose-Capillary: 171 mg/dL — ABNORMAL HIGH (ref 70–99)
Glucose-Capillary: 178 mg/dL — ABNORMAL HIGH (ref 70–99)
Glucose-Capillary: 180 mg/dL — ABNORMAL HIGH (ref 70–99)

## 2022-10-31 LAB — TYPE AND SCREEN
ABO/RH(D): O NEG
Antibody Screen: NEGATIVE
Unit division: 0

## 2022-10-31 MED ORDER — BUPROPION HCL ER (XL) 150 MG PO TB24
150.0000 mg | ORAL_TABLET | Freq: Every day | ORAL | Status: DC
  Administered 2022-11-01 – 2022-11-08 (×8): 150 mg via ORAL
  Filled 2022-10-31 (×8): qty 1

## 2022-10-31 MED ORDER — POLYETHYLENE GLYCOL 3350 17 G PO PACK
17.0000 g | PACK | Freq: Every day | ORAL | Status: DC
  Administered 2022-10-31 – 2022-11-07 (×6): 17 g via ORAL
  Filled 2022-10-31 (×8): qty 1

## 2022-10-31 NOTE — Consult Note (Signed)
[facility] Face-to-Face Psychiatry Consult   Reason for Consult: Depression Referring Physician: Agbata Patient Identification: Russell [name] MRN:  [id number] Principal Diagnosis: Acute blood loss anemia Diagnosis:  Principal Problem:   Acute blood loss anemia Active Problems:   Acute urinary retention   Dyslipidemia   Anxiety and depression   Essential hypertension   Type 2 diabetes mellitus with peripheral neuropathy (HCC)   Acute gastric ulcer with hemorrhage   Major depressive disorder, recurrent episode, moderate (HCC)   Total Time spent with patient: 20 minutes  Subjective:   Russell [name] is a 57 y.o. male patient admitted with multiple medical problems mostly stemming from obesity and diabetes.Marland Kitchen  HPI: Russell [name] is severely overweight and has had amputations due to his diabetes.  This has led to his depression.  Past Psychiatric History: Nothing significant.  He denies any suicidal ideation.  Risk to Self:   Risk to Others:   Prior Inpatient Therapy:   Prior Outpatient Therapy:    Past Medical History:  Past Medical History:  Diagnosis Date   Anxiety    CHF (congestive heart failure) (HCC)    EF 30-35%   Chronic diastolic (congestive) heart failure (HCC)    Depression    Diabetes mellitus without complication (HCC)    Hyperlipemia    Hypertension    Morbid obesity (HCC)    Peripheral edema     Past Surgical History:  Procedure Laterality Date   APPENDECTOMY     BELOW KNEE LEG AMPUTATION     Right leg   BIOPSY  10/14/2022   Procedure: BIOPSY;  Surgeon: Norma Fredrickson, Boykin Nearing, MD;  Location: [facility] ENDOSCOPY;  Service: Gastroenterology;;   COLONOSCOPY N/A 10/16/2022   Procedure: COLONOSCOPY;  Surgeon: [name], MD;  Location: [facility] ENDOSCOPY;  Service: Gastroenterology;  Laterality: N/A;   ESOPHAGOGASTRODUODENOSCOPY (EGD) WITH PROPOFOL N/A 10/14/2022   Procedure: ESOPHAGOGASTRODUODENOSCOPY (EGD) WITH PROPOFOL;  Surgeon: Toledo, Boykin Nearing, MD;  Location: [facility] ENDOSCOPY;   Service: Gastroenterology;  Laterality: N/A;   ESOPHAGOGASTRODUODENOSCOPY (EGD) WITH PROPOFOL N/A 10/26/2022   Procedure: ESOPHAGOGASTRODUODENOSCOPY (EGD) WITH PROPOFOL;  Surgeon: Regis Bill, MD;  Location: [facility] ENDOSCOPY;  Service: Endoscopy;  Laterality: N/A;   TONSILLECTOMY     Family History:  Family History  Problem Relation Age of Onset   COPD Mother    Diabetes Father    CAD Father    Kidney cancer Brother    Family Psychiatric  History: Unremarkable Social History:  Social History   Substance and Sexual Activity  Alcohol Use No     Social History   Substance and Sexual Activity  Drug Use No    Social History   Socioeconomic History   Marital status: Legally Separated    Spouse name: Not on file   Number of children: Not on file   Years of education: Not on file   Highest education level: Not on file  Occupational History   Not on file  Tobacco Use   Smoking status: Never   Smokeless tobacco: Never  Vaping Use   Vaping status: Never Used  Substance and Sexual Activity   Alcohol use: No   Drug use: No   Sexual activity: Not Currently  Other Topics Concern   Not on file  Social History Narrative   Living with mother now   Wheelchair bound at baseline   Social Determinants of Health   Financial Resource Strain: Medium Risk (06/17/2022)   Received from [facility]   Overall Financial Resource Strain (  CARDIA)    Difficulty of Paying Living Expenses: Somewhat hard  Food Insecurity: No Food Insecurity (10/24/2022)   Hunger Vital Sign    Worried About Running Out of Food in the Last Year: Never true    Ran Out of Food in the Last Year: Never true  Transportation Needs: No Transportation Needs (10/24/2022)   PRAPARE - Administrator, Civil Service (Medical): No    Lack of Transportation (Non-Medical): No  Physical Activity: Not on file  Stress: Not on file  Social Connections: Not on file   Additional Social  History:    Allergies:   Allergies  Allergen Reactions   Fluoxetine     Other reaction(s): Hallucination   Shellfish Allergy Hives   Sulfa Antibiotics Hives   Vancomycin     Other reaction(s): Red Man Syndrome   Metformin Nausea Only    Labs:  Results for orders placed or performed during the hospital encounter of 10/23/22 (from the past 48 hour(s))  Glucose, capillary     Status: Abnormal   Collection Time: 10/29/22  3:39 PM  Result Value Ref Range   Glucose-Capillary 183 (H) 70 - 99 mg/dL    Comment: Glucose reference range applies only to samples taken after fasting for at least 8 hours.  Glucose, capillary     Status: Abnormal   Collection Time: 10/29/22  8:05 PM  Result Value Ref Range   Glucose-Capillary 222 (H) 70 - 99 mg/dL    Comment: Glucose reference range applies only to samples taken after fasting for at least 8 hours.  Glucose, capillary     Status: Abnormal   Collection Time: 10/30/22 12:18 AM  Result Value Ref Range   Glucose-Capillary 184 (H) 70 - 99 mg/dL    Comment: Glucose reference range applies only to samples taken after fasting for at least 8 hours.  Glucose, capillary     Status: Abnormal   Collection Time: 10/30/22  3:17 AM  Result Value Ref Range   Glucose-Capillary 171 (H) 70 - 99 mg/dL    Comment: Glucose reference range applies only to samples taken after fasting for at least 8 hours.  CBC     Status: Abnormal   Collection Time: 10/30/22  6:32 AM  Result Value Ref Range   WBC 3.5 (L) 4.0 - 10.5 K/uL   RBC 2.66 (L) 4.22 - 5.81 MIL/uL   Hemoglobin 6.8 (L) 13.0 - 17.0 g/dL   HCT 10.6 (L) 26.9 - 48.5 %   MCV 84.2 80.0 - 100.0 fL   MCH 25.6 (L) 26.0 - 34.0 pg   MCHC 30.4 30.0 - 36.0 g/dL   RDW 46.2 70.3 - 50.0 %   Platelets 191 150 - 400 K/uL   nRBC 1.4 (H) 0.0 - 0.2 %    Comment: Performed at [facility], [address], [place], [place] [address]  Basic metabolic panel     Status: Abnormal   Collection Time: 10/30/22  6:32 AM   Result Value Ref Range   Sodium 135 135 - 145 mmol/L   Potassium 3.7 3.5 - 5.1 mmol/L   Chloride 101 98 - 111 mmol/L   CO2 27 22 - 32 mmol/L   Glucose, Bld 178 (H) 70 - 99 mg/dL    Comment: Glucose reference range applies only to samples taken after fasting for at least 8 hours.   BUN 15 6 - 20 mg/dL   Creatinine, Ser 2.99 0.61 - 1.24 mg/dL   Calcium 7.8 (L)  8.9 - 10.3 mg/dL   GFR, Estimated >16 >10 mL/min    Comment: (NOTE) Calculated using the CKD-EPI Creatinine Equation (2021)    Anion gap 7 5 - 15    Comment: Performed at [facility], [address] [place], [place] [address]  Glucose, capillary     Status: Abnormal   Collection Time: 10/30/22  8:16 AM  Result Value Ref Range   Glucose-Capillary 143 (H) 70 - 99 mg/dL    Comment: Glucose reference range applies only to samples taken after fasting for at least 8 hours.  Prepare RBC (crossmatch)     Status: None   Collection Time: 10/30/22  9:47 AM  Result Value Ref Range   Order Confirmation      ORDER PROCESSED BY BLOOD BANK Performed at [facility], [address] [place], [place] [address]   Type and screen [facility]     Status: None   Collection Time: 10/30/22 10:29 AM  Result Value Ref Range   ABO/RH(D) O NEG    Antibody Screen NEG    Sample Expiration 11/02/2022,2359    Unit Number [id number]    Blood Component Type RBC CPDA1, LR    Unit division 00    Status of Unit ISSUED,FINAL    Transfusion Status OK TO TRANSFUSE    Crossmatch Result COMPATIBLE   Glucose, capillary     Status: Abnormal   Collection Time: 10/30/22 12:39 PM  Result Value Ref Range   Glucose-Capillary 209 (H) 70 - 99 mg/dL    Comment: Glucose reference range applies only to samples taken after fasting for at least 8 hours.  Glucose, capillary     Status: Abnormal   Collection Time: 10/30/22  5:15 PM  Result Value Ref Range   Glucose-Capillary 180 (H) 70 - 99 mg/dL    Comment: Glucose  reference range applies only to samples taken after fasting for at least 8 hours.  Glucose, capillary     Status: Abnormal   Collection Time: 10/30/22  8:03 PM  Result Value Ref Range   Glucose-Capillary 209 (H) 70 - 99 mg/dL    Comment: Glucose reference range applies only to samples taken after fasting for at least 8 hours.  Glucose, capillary     Status: Abnormal   Collection Time: 10/30/22 11:41 PM  Result Value Ref Range   Glucose-Capillary 170 (H) 70 - 99 mg/dL    Comment: Glucose reference range applies only to samples taken after fasting for at least 8 hours.  Glucose, capillary     Status: Abnormal   Collection Time: 10/31/22  4:22 AM  Result Value Ref Range   Glucose-Capillary 178 (H) 70 - 99 mg/dL    Comment: Glucose reference range applies only to samples taken after fasting for at least 8 hours.  CBC     Status: Abnormal   Collection Time: 10/31/22  5:59 AM  Result Value Ref Range   WBC 4.0 4.0 - 10.5 K/uL   RBC 2.86 (L) 4.22 - 5.81 MIL/uL   Hemoglobin 7.3 (L) 13.0 - 17.0 g/dL   HCT 08.6 (L) 57.8 - 46.9 %   MCV 82.2 80.0 - 100.0 fL   MCH 25.5 (L) 26.0 - 34.0 pg   MCHC 31.1 30.0 - 36.0 g/dL   RDW 62.9 52.8 - 41.3 %   Platelets 193 150 - 400 K/uL   nRBC 1.7 (H) 0.0 - 0.2 %    Comment: Performed at [facility], [address] [place], [place] [address]  Glucose, capillary     Status: Abnormal   Collection Time: 10/31/22  7:37 AM  Result Value Ref Range   Glucose-Capillary 145 (H) 70 - 99 mg/dL    Comment: Glucose reference range applies only to samples taken after fasting for at least 8 hours.  Glucose, capillary     Status: Abnormal   Collection Time: 10/31/22 11:44 AM  Result Value Ref Range   Glucose-Capillary 168 (H) 70 - 99 mg/dL    Comment: Glucose reference range applies only to samples taken after fasting for at least 8 hours.    Current Facility-Administered Medications  Medication Dose Route Frequency Provider Last Rate Last Admin   0.9 %   sodium chloride infusion (Manually program via Guardrails IV Fluids)   Intravenous Once Charise Killian, MD       0.9 %  sodium chloride infusion (Manually program via Guardrails IV Fluids)   Intravenous Once Charise Killian, MD       0.9 %  sodium chloride infusion (Manually program via Guardrails IV Fluids)   Intravenous Once Agbata, Tochukwu, MD       acetaminophen (TYLENOL) tablet 650 mg  650 mg Oral Q6H PRN Mansy, Jan A, MD   650 mg at 10/26/22 2025   Or   acetaminophen (TYLENOL) suppository 650 mg  650 mg Rectal Q6H PRN Mansy, Jan A, MD       atorvastatin (LIPITOR) tablet 40 mg  40 mg Oral Daily Mansy, Jan A, MD   40 mg at 10/31/22 4270   Chlorhexidine Gluconate Cloth 2 % PADS 6 each  6 each Topical Daily Agbata, Tochukwu, MD   6 each at 10/31/22 1000   DULoxetine (CYMBALTA) DR capsule 60 mg  60 mg Oral Daily Bennett, Christal H, NP   60 mg at 10/31/22 0859   guaiFENesin (ROBITUSSIN) 100 MG/5ML liquid 15 mL  15 mL Oral Q4H PRN Charise Killian, MD   15 mL at 10/28/22 0612   HYDROmorphone (DILAUDID) injection 1 mg  1 mg Intravenous Q4H PRN Mansy, Jan A, MD   1 mg at 10/31/22 6237   insulin aspart (novoLOG) injection 0-9 Units  0-9 Units Subcutaneous Q4H Mansy, Jan A, MD   2 Units at 10/31/22 1158   insulin detemir (LEVEMIR) injection 42 Units  42 Units Subcutaneous BID Mansy, Jan A, MD   42 Units at 10/31/22 0900   ipratropium-albuterol (DUONEB) 0.5-2.5 (3) MG/3ML nebulizer solution 3 mL  3 mL Nebulization Q6H PRN Charise Killian, MD   3 mL at 10/27/22 1345   loratadine (CLARITIN) tablet 10 mg  10 mg Oral Daily Mansy, Jan A, MD   10 mg at 10/31/22 0859   magnesium hydroxide (MILK OF MAGNESIA) suspension 15 mL  15 mL Oral Daily PRN Agbata, Tochukwu, MD   15 mL at 10/28/22 1507   melatonin tablet 5 mg  5 mg Oral QHS Bennett, Christal H, NP   5 mg at 10/30/22 2151   ondansetron (ZOFRAN) tablet 4 mg  4 mg Oral Q6H PRN Mansy, Jan A, MD       Or   ondansetron [facility]) injection 4 mg   4 mg Intravenous Q6H PRN Mansy, Jan A, MD   4 mg at 10/31/22 0449   oxyCODONE-acetaminophen (PERCOCET/ROXICET) 5-325 MG per tablet 1 tablet  1 tablet Oral Q6H PRN Mansy, Jan A, MD   1 tablet at 10/29/22 2043   pantoprazole (PROTONIX) EC tablet 40 mg  40 mg Oral BID Charise Killian, MD   40 mg at 10/31/22 0859   polyethylene glycol (MIRALAX / GLYCOLAX) packet 17 g  17 g Oral Daily Agbata, Tochukwu, MD   17 g at 10/31/22 1158   potassium chloride SA (KLOR-CON M) CR tablet 20 mEq  20 mEq Oral BID PRN Mansy, Jan A, MD       pregabalin (LYRICA) capsule 150 mg  150 mg Oral TID Mansy, Jan A, MD   150 mg at 10/31/22 4098   senna-docusate (Senokot-S) tablet 2 tablet  2 tablet Oral Daily PRN Mansy, Jan A, MD   2 tablet at 10/28/22 1191   sodium chloride (OCEAN) 0.65 % nasal spray 1 spray  1 spray Each Nare PRN Agbata, Tochukwu, MD   1 spray at 10/29/22 1605   sodium chloride flush (NS) 0.9 % injection 10-40 mL  10-40 mL Intracatheter Q12H Charise Killian, MD   10 mL at 10/31/22 0902   sodium chloride flush (NS) 0.9 % injection 10-40 mL  10-40 mL Intracatheter PRN Charise Killian, MD       tamsulosin [facility]) capsule 0.4 mg  0.4 mg Oral QPC breakfast Mansy, Jan A, MD   0.4 mg at 10/31/22 0859   traZODone (DESYREL) tablet 25 mg  25 mg Oral QHS PRN Mansy, Jan A, MD   25 mg at 10/29/22 2043    Musculoskeletal: Strength & Muscle Tone: within normal limits Gait & Station: unable to stand Patient leans: N/A            Psychiatric Specialty Exam:  Presentation  General Appearance:  Appropriate for Environment  Eye Contact: Good  Speech: Clear and Coherent  Speech Volume: Normal  Handedness:No data recorded  Mood and Affect  Mood: Euthymic; Hopeless  Affect: Congruent   Thought Process  Thought Processes: Coherent  Descriptions of Associations:Intact  Orientation:Full (Time, Place and Person)  Thought Content:Logical  History of Schizophrenia/Schizoaffective  disorder:No data recorded Duration of Psychotic Symptoms:No data recorded Hallucinations:No data recorded Ideas of Reference:None  Suicidal Thoughts:No data recorded Homicidal Thoughts:No data recorded  Sensorium  Memory: Immediate Good; Recent Good; Remote Good  Judgment: Good  Insight: Good   Executive Functions  Concentration: Good  Attention Span: Good  Recall: Good  Fund of Knowledge: Good  Language: Good   Psychomotor Activity  Psychomotor Activity:No data recorded  Assets  Assets: Communication Skills; Desire for Improvement; Financial Resources/Insurance; Housing; Resilience; Social Support   Sleep  Sleep:No data recorded  Russell [name] is a 57 year old white male with a long history of obesity and diabetes.  He is feeling depressed and suffers from a lack of energy.  He is alert and oriented x 3.  He is pleasant cooperative with good eye contact.  Speech is conversational and not pressured.  Mood is depressed affect is flat.  Thought process is goal directed thought content he denies suicidal ideation, homicidal ideation, auditory or visual hallucinations.  Judgment and insight are good.  Blood pressure (!) 104/52, pulse 74, temperature 97.6 F (36.4 C), resp. rate 20, height 5\' 11"  (1.803 m), weight (!) 170 kg, SpO2 100%. Body mass index is 52.27 kg/m.  Treatment Plan Summary: Medication management and Plan continue with Cymbalta but I think he could benefit from something more stimulating and I will add Wellbutrin XL 150 mg in the morning.  Disposition: No evidence of imminent risk to self or others at present.   Patient does not meet criteria for psychiatric inpatient admission.  Russell [name]  Tresea Mall, DO 10/31/2022 12:56 PM

## 2022-10-31 NOTE — Plan of Care (Signed)

## 2022-10-31 NOTE — Plan of Care (Signed)
  Problem: Education: Goal: Ability to describe self-care measures that may prevent or decrease complications (Diabetes Survival Skills Education) will improve 10/31/2022 2349 by Sheria Lang, RN Outcome: Progressing 10/31/2022 2349 by Sheria Lang, RN Outcome: Progressing Goal: Individualized Educational Video(s) Outcome: Progressing   Problem: Cardiac: Goal: Ability to maintain an adequate cardiac output will improve Outcome: Progressing   Problem: Health Behavior/Discharge Planning: Goal: Ability to identify and utilize available resources and services will improve Outcome: Progressing Goal: Ability to manage health-related needs will improve Outcome: Progressing   Problem: Fluid Volume: Goal: Ability to achieve a balanced intake and output will improve Outcome: Progressing   Problem: Metabolic: Goal: Ability to maintain appropriate glucose levels will improve Outcome: Progressing   Problem: Nutritional: Goal: Maintenance of adequate nutrition will improve Outcome: Progressing Goal: Maintenance of adequate weight for body size and type will improve Outcome: Progressing   Problem: Respiratory: Goal: Will regain and/or maintain adequate ventilation Outcome: Progressing   Problem: Urinary Elimination: Goal: Ability to achieve and maintain adequate renal perfusion and functioning will improve Outcome: Progressing   Problem: Education: Goal: Ability to describe self-care measures that may prevent or decrease complications (Diabetes Survival Skills Education) will improve Outcome: Progressing Goal: Individualized Educational Video(s) Outcome: Progressing   Problem: Coping: Goal: Ability to adjust to condition or change in health will improve Outcome: Progressing   Problem: Fluid Volume: Goal: Ability to maintain a balanced intake and output will improve Outcome: Progressing   Problem: Health Behavior/Discharge Planning: Goal: Ability to identify and utilize available  resources and services will improve Outcome: Progressing Goal: Ability to manage health-related needs will improve Outcome: Progressing   Problem: Metabolic: Goal: Ability to maintain appropriate glucose levels will improve Outcome: Progressing   Problem: Nutritional: Goal: Maintenance of adequate nutrition will improve Outcome: Progressing Goal: Progress toward achieving an optimal weight will improve Outcome: Progressing   Problem: Skin Integrity: Goal: Risk for impaired skin integrity will decrease Outcome: Progressing   Problem: Tissue Perfusion: Goal: Adequacy of tissue perfusion will improve Outcome: Progressing   Problem: Education: Goal: Knowledge of General Education information will improve Description: Including pain rating scale, medication(s)/side effects and non-pharmacologic comfort measures Outcome: Progressing   Problem: Health Behavior/Discharge Planning: Goal: Ability to manage health-related needs will improve Outcome: Progressing   Problem: Clinical Measurements: Goal: Ability to maintain clinical measurements within normal limits will improve Outcome: Progressing Goal: Will remain free from infection Outcome: Progressing Goal: Diagnostic test results will improve Outcome: Progressing Goal: Respiratory complications will improve Outcome: Progressing Goal: Cardiovascular complication will be avoided Outcome: Progressing   Problem: Activity: Goal: Risk for activity intolerance will decrease Outcome: Progressing   Problem: Nutrition: Goal: Adequate nutrition will be maintained Outcome: Progressing   Problem: Coping: Goal: Level of anxiety will decrease Outcome: Progressing   Problem: Elimination: Goal: Will not experience complications related to bowel motility Outcome: Progressing Goal: Will not experience complications related to urinary retention Outcome: Progressing   Problem: Pain Managment: Goal: General experience of comfort will  improve Outcome: Progressing   Problem: Safety: Goal: Ability to remain free from injury will improve Outcome: Progressing   Problem: Skin Integrity: Goal: Risk for impaired skin integrity will decrease Outcome: Progressing

## 2022-10-31 NOTE — Progress Notes (Signed)
Progress Note   Patient: Russell [name] ZOX:[id number] DOB: 1965-06-01 DOA: 10/23/2022     7 DOS: the patient was seen and examined on 10/31/2022   Brief hospital course: ANIS KENDALL is a 57 y.o. Caucasian male with medical history significant for combined systolic and diastolic CHF, right BKA, anxiety and depression, type 2 diabetes mellitus, hypertension, dyslipidemia and morbid obesity, who presented to the emergency room with acute onset of dyspnea.  The patient was just admitted from 8/13 till 8/24 acute blood loss anemia with associated upper GI bleeding and hypovolemic shock.  His EGD showed gastritis and nonbleeding gastric ulcers.  His colonoscopy showed no active bleeding.  He received a total of 4 units of PRBCs during his hospitalization as well as IV iron.  His hemoglobin was 8.3 on 8/18.  He required a brief IV Levophed for his hypovolemic shock.  He was placed on IV insulin drip for hyperosmolar hyperglycemic state.  He currently denies any nausea or vomiting or abdominal pain or heartburn.  He did not notice any melena or bright red bleeding per rectum.  No dysuria, oliguria or hematuria or flank pain.  No chest pain or palpitations.  No cough or wheezing or hemoptysis.  No other bleeding diathesis.  He is stated that this dyspnea started from the night he was discharged home.  He took 2 aspirin that night for headache.  He has not taken any more aspirin.  He denied taking Eliquis.     ED Course: When he came to the ER, BP was 103/41 with otherwise normal vital signs.  Labs revealed hemoglobin of 3.9 hematocrit 13 with MCH of 25.8.  High sensitive troponin was 6 and later 7.  BNP was 190.6.  CMP showed sodium 132 potassium 5, glucose 313 and BUN 51 with a creatinine 1.12.  Magnesium was 2.5 and albumin 2.5 with total protein of 5.3.  Beta-hydroxybutyrate was normal at 0.21. EKG as reviewed by me : EKG showed accelerated junctional rhythm with a rate of 72 with nonspecific interventricular  conduction delay. Imaging: Portable chest x-ray showed stable cardiomegaly and vascular congestion and improved left lung base opacity.   The patient was typed and crossmatch and will be transfused 2 units of packed red blood cells.  He was given 4 mg of IV morphine sulfate, 80 mg of IV Protonix bolus followed by IV drip.  He will be admitted to an observation progressive unit bed for further evaluation and management.       Assessment and Plan:  Acute blood loss anemia:  Secondary to recurrent upper GI bleed from NSAID induced peptic ulcer disease Had an upper endoscopy with healed gastric ulcers.  Nonbleeding superficial duodenal ulcer Status post transfusion of 7 units of packed RBC since this hospitalization Continue to hold aspirin, plavix.  Monitor H&H closely and transfuse as needed       Urinary retention Patient unable to void and had 2 In-N-Out catheterization with over a liter of urine drained Will place a Foley catheter Continue Flomax 0.4 mg daily        Hyponatremia:  Resolved      HLD:  continue on statin      DM2: likely poorly controlled.  Blood sugars are stable Continue consistent carbohydrate diet Continue on levemir,  SSI w/ accuchecks      Peripheral arterial disease /Hx of right BKA:  continue w/ supportive care      Peripheral neuropathy:  continue on home dose of pregabalin  Depression: severity unknown.  Continue on home dose of duloxetine. Holding nortriptyline  Appreciate psych input.  Recommends adding Wellbutrin XL 50 mg daily   Morbid obesity: BMI 52.2.  Complicates overall prognosis and care   Generalized weakness:  PT/OT consulted.  They recommend skilled nursing facility on discharge            Subjective: Patient is seen and examined at the bedside.  Complains of constipation  Physical Exam: Vitals:   10/30/22 2345 10/31/22 0420 10/31/22 0733 10/31/22 1206  BP: 110/63 129/71 120/65 (!) 104/52  Pulse: 71 75 72  74  Resp: 18 20 17 20   Temp: 98.3 F (36.8 C) 98.4 F (36.9 C) 98.4 F (36.9 C) 97.6 F (36.4 C)  TempSrc: Oral Oral    SpO2: 96% 100% 93% 100%  Weight:      Height:       General exam: appears comfortable. Morbidly obese Respiratory system: decreased breath sounds b/l  Cardiovascular system: S1 & S2+. No rubs or clicks  Gastrointestinal system: abd is soft, NT, obese & normal bowel sounds  Central nervous system: alert & oriented. Moves all extremities  Psychiatry: judgement and insight appears normal. Flat mood and affect     Data Reviewed: Labs reviewed.  Hemoglobin 7.3, hematocrit 25.5 There are no new results to review at this time.  Family Communication: Patient is seen and examined at the bedside.  Plan of care discussed with him in detail.  He verbalizes understanding and agrees with the plan.  Disposition: Status is: Inpatient Remains inpatient appropriate because: Awaiting discharge to skilled nursing facility  Planned Discharge Destination: Skilled nursing facility    Time spent: 33 minutes  Author: Lucile Shutters, MD 10/31/2022 1:48 PM  For on call review [email or web address].

## 2022-11-01 DIAGNOSIS — D62 Acute posthemorrhagic anemia: Secondary | ICD-10-CM | POA: Diagnosis not present

## 2022-11-01 LAB — GLUCOSE, CAPILLARY
Glucose-Capillary: 166 mg/dL — ABNORMAL HIGH (ref 70–99)
Glucose-Capillary: 131 mg/dL — ABNORMAL HIGH (ref 70–99)
Glucose-Capillary: 142 mg/dL — ABNORMAL HIGH (ref 70–99)
Glucose-Capillary: 122 mg/dL — ABNORMAL HIGH (ref 70–99)
Glucose-Capillary: 141 mg/dL — ABNORMAL HIGH (ref 70–99)

## 2022-11-01 LAB — CBC
HCT: 23.8 % — ABNORMAL LOW (ref 39.0–52.0)
Hemoglobin: 7.4 g/dL — ABNORMAL LOW (ref 13.0–17.0)
MCH: 26.1 pg (ref 26.0–34.0)
MCHC: 31.1 g/dL (ref 30.0–36.0)
MCV: 84.1 fL (ref 80.0–100.0)
Platelets: 179 10*3/uL (ref 150–400)
RBC: 2.83 MIL/uL — ABNORMAL LOW (ref 4.22–5.81)
RDW: 15.1 % (ref 11.5–15.5)
WBC: 4.6 10*3/uL (ref 4.0–10.5)
nRBC: 1.7 % — ABNORMAL HIGH (ref 0.0–0.2)

## 2022-11-01 LAB — BASIC METABOLIC PANEL
Anion gap: 8 (ref 5–15)
BUN: 15 mg/dL (ref 6–20)
CO2: 26 mmol/L (ref 22–32)
Calcium: 7.8 mg/dL — ABNORMAL LOW (ref 8.9–10.3)
Chloride: 103 mmol/L (ref 98–111)
Creatinine, Ser: 0.83 mg/dL (ref 0.61–1.24)
GFR, Estimated: >60 mL/min (ref >60)
Glucose, Bld: 156 mg/dL — ABNORMAL HIGH (ref 70–99)
Potassium: 4.2 mmol/L (ref 3.5–5.1)
Sodium: 137 mmol/L (ref 135–145)

## 2022-11-01 MED ORDER — FUROSEMIDE 10 MG/ML IJ SOLN
40.0000 mg | Freq: Once | INTRAMUSCULAR | Status: AC
  Administered 2022-11-01: 40 mg via INTRAVENOUS
  Filled 2022-11-01: qty 4

## 2022-11-01 NOTE — Progress Notes (Signed)
Progress Note   Patient: Russell [name] [id number] DOB: [birth year] DOA: 10/23/2022     8 DOS: the patient was seen and examined on 11/01/2022   Brief hospital course:  Russell [name] is a 57 y.o. Caucasian male with medical history significant for combined systolic and diastolic CHF, right BKA, anxiety and depression, type 2 diabetes mellitus, hypertension, dyslipidemia and morbid obesity, who presented to the emergency room with acute onset of dyspnea.  The patient was just admitted from 8/13 till 8/24 acute blood loss anemia with associated upper GI bleeding and hypovolemic shock.  His EGD showed gastritis and nonbleeding gastric ulcers.  His colonoscopy showed no active bleeding.  He received a total of 4 units of PRBCs during his hospitalization as well as IV iron.  His hemoglobin was 8.3 on 8/18.  He required a brief IV Levophed for his hypovolemic shock.  He was placed on IV insulin drip for hyperosmolar hyperglycemic state.  He currently denies any nausea or vomiting or abdominal pain or heartburn.  He did not notice any melena or bright red bleeding per rectum.  No dysuria, oliguria or hematuria or flank pain.  No chest pain or palpitations.  No cough or wheezing or hemoptysis.  No other bleeding diathesis.  He is stated that this dyspnea started from the night he was discharged home.  He took 2 aspirin that night for headache.  He has not taken any more aspirin.  He denied taking Eliquis.     ED Course: When he came to the ER, BP was 103/41 with otherwise normal vital signs.  Labs revealed hemoglobin of 3.9 hematocrit 13 with MCH of 25.8.  High sensitive troponin was 6 and later 7.  BNP was 190.6.  CMP showed sodium 132 potassium 5, glucose 313 and BUN 51 with a creatinine 1.12.  Magnesium was 2.5 and albumin 2.5 with total protein of 5.3.  Beta-hydroxybutyrate was normal at 0.21. EKG as reviewed by me : EKG showed accelerated junctional rhythm with a rate of 72 with nonspecific  interventricular conduction delay. Imaging: Portable chest x-ray showed stable cardiomegaly and vascular congestion and improved left lung base opacity.   The patient was typed and crossmatch and will be transfused 2 units of packed red blood cells.  He was given 4 mg of IV morphine sulfate, 80 mg of IV Protonix bolus followed by IV drip.  He will be admitted to an observation progressive unit bed for further evaluation and management.   Assessment and Plan:   Acute blood loss anemia:  Secondary to recurrent upper GI bleed from NSAID induced peptic ulcer disease Had an upper endoscopy with healed gastric ulcers.  Nonbleeding superficial duodenal ulcer Status post transfusion of 7 units of packed RBC since this hospitalization. Hemoglobin is stable at 7.4 Continue to hold aspirin, plavix.  Monitor H&H closely and transfuse as needed       Urinary retention Patient unable to void and had 2 In-N-Out catheterization with over a liter of urine drained Will place a Foley catheter Continue Flomax 0.4 mg daily  Voiding trial       Hyponatremia:  Resolved      HLD:  continue on statin      DM2: likely poorly controlled.  Blood sugars are stable Continue consistent carbohydrate diet Continue on levemir,  SSI w/ accuchecks      Peripheral arterial disease /Hx of right BKA:  continue w/ supportive care      Peripheral neuropathy:  continue on  home dose of pregabalin    Depression: severity unknown.  Continue on home dose of duloxetine. Holding nortriptyline  Appreciate psych input.  Recommends adding Wellbutrin XL 50 mg daily   Morbid obesity: BMI 52.2.  Complicates overall prognosis and care   Generalized weakness:  PT/OT consulted.  They recommend skilled nursing facility on discharge           Subjective: Patient is seen and examined at the bedside.  Has no new complaints  Physical Exam: Vitals:   10/31/22 2332 11/01/22 0450 11/01/22 0741 11/01/22 1139  BP:  121/61 (!) 107/50 (!) 119/53 (!) 109/48  Pulse: 76 67 73 70  Resp: 19 20 18 18   Temp: 98 F (36.7 C) 98.6 F (37 C) 98.1 F (36.7 C) 98.2 F (36.8 C)  TempSrc:  Oral    SpO2: 100% 100% 97% 97%  Weight:      Height:       General exam: appears comfortable. Morbidly obese Respiratory system: decreased breath sounds b/l  Cardiovascular system: S1 & S2+. No rubs or clicks  Gastrointestinal system: abd is soft, NT, obese & normal bowel sounds  Central nervous system: alert & oriented. Moves all extremities  Psychiatry: judgement and insight appears normal. Flat mood and affect  Data Reviewed: Labs reviewed.  Hemoglobin 7.4 There are no new results to review at this time.   Family Communication: Plan of care discussed with patient  Disposition: Status is: Inpatient Remains inpatient appropriate because: Awaiting discharge to skilled nursing facility.  Planned Discharge Destination: Skilled nursing facility    Time spent: 30 minutes  Author: Lucile Shutters, MD 11/01/2022 2:09 PM  For on call review [email or web address].

## 2022-11-01 NOTE — TOC Progression Note (Signed)
Transition of Care [facility]) - Progression Note    Patient Details  Name: ROBBE MCCASTER MRN: [id number] Date of Birth: 05-Aug-1965  [facility] CM/SW Contact  Truddie Hidden, RN Phone Number: 11/01/2022, 8:52 AM  Clinical Narrative:    No bed offer. Bed search extended.    Expected Discharge Plan: Home/Self Care Barriers to Discharge: Continued Medical Work up  Expected Discharge Plan and Services       Living arrangements for the past 2 months: Single Family Home                                       Social Determinants of Health (SDOH) Interventions SDOH Screenings   Food Insecurity: No Food Insecurity (10/24/2022)  Housing: Low Risk  (10/24/2022)  Transportation Needs: No Transportation Needs (10/24/2022)  Utilities: Not At Risk (10/24/2022)  Financial Resource Strain: Medium Risk (06/17/2022)   Received from [facility]  Tobacco Use: Low Risk  (10/26/2022)    Readmission Risk Interventions    10/25/2022    3:55 PM  Readmission Risk Prevention Plan  Transportation Screening Complete  PCP or Specialist Appt within 3-5 Days Complete  HRI or Home Care Consult Complete  Social Work Consult for Recovery Care Planning/Counseling Complete  Palliative Care Screening Not Applicable  Medication Review [employer]) Complete

## 2022-11-01 NOTE — Care Management Important Message (Signed)
Important Message  Patient Details  Name: Russell [name] MRN: [id number] Date of Birth: 11/04/1965   Medicare Important Message Given:  Yes     [name] 11/01/2022, 12:45 PM

## 2022-11-02 DIAGNOSIS — D62 Acute posthemorrhagic anemia: Secondary | ICD-10-CM | POA: Diagnosis not present

## 2022-11-02 LAB — GLUCOSE, CAPILLARY
Glucose-Capillary: 103 mg/dL — ABNORMAL HIGH (ref 70–99)
Glucose-Capillary: 124 mg/dL — ABNORMAL HIGH (ref 70–99)
Glucose-Capillary: 152 mg/dL — ABNORMAL HIGH (ref 70–99)
Glucose-Capillary: 155 mg/dL — ABNORMAL HIGH (ref 70–99)
Glucose-Capillary: 182 mg/dL — ABNORMAL HIGH (ref 70–99)
Glucose-Capillary: 218 mg/dL — ABNORMAL HIGH (ref 70–99)
Glucose-Capillary: 250 mg/dL — ABNORMAL HIGH (ref 70–99)

## 2022-11-02 MED ORDER — ORAL CARE MOUTH RINSE: 15.0000 mL | OROMUCOSAL | Status: DC | PRN

## 2022-11-02 MED ORDER — FUROSEMIDE 40 MG PO TABS
40.0000 mg | ORAL_TABLET | Freq: Two times a day (BID) | ORAL | Status: DC
  Administered 2022-11-03 – 2022-11-08 (×11): 40 mg via ORAL
  Filled 2022-11-02 (×11): qty 1

## 2022-11-02 MED ORDER — GUAIFENESIN ER 600 MG PO TB12
1200.0000 mg | ORAL_TABLET | Freq: Two times a day (BID) | ORAL | Status: DC
  Administered 2022-11-02 – 2022-11-08 (×12): 1200 mg via ORAL
  Filled 2022-11-02 (×12): qty 2

## 2022-11-02 MED ORDER — FUROSEMIDE 10 MG/ML IJ SOLN
40.0000 mg | Freq: Once | INTRAMUSCULAR | Status: AC
  Administered 2022-11-02: 40 mg via INTRAVENOUS
  Filled 2022-11-02: qty 4

## 2022-11-02 NOTE — Plan of Care (Signed)

## 2022-11-02 NOTE — Plan of Care (Cosign Needed)
Problem: Education: Goal: Ability to describe self-care measures that may prevent or decrease complications (Diabetes Survival Skills Education) will improve 11/02/2022 1259 by Ileana Ladd, Student-RN Outcome: Progressing 11/02/2022 1252 by Ileana Ladd, Student-RN Outcome: Progressing Goal: Individualized Educational Video(s) 11/02/2022 1259 by Ileana Ladd, Student-RN Outcome: Progressing 11/02/2022 1252 by Ileana Ladd, Student-RN Outcome: Progressing   Problem: Cardiac: Goal: Ability to maintain an adequate cardiac output will improve 11/02/2022 1259 by Ileana Ladd, Student-RN Outcome: Progressing 11/02/2022 1252 by Ileana Ladd, Student-RN Outcome: Progressing   Problem: Health Behavior/Discharge Planning: Goal: Ability to identify and utilize available resources and services will improve 11/02/2022 1259 by Ileana Ladd, Student-RN Outcome: Progressing 11/02/2022 1252 by Ileana Ladd, Student-RN Outcome: Progressing Goal: Ability to manage health-related needs will improve 11/02/2022 1259 by Ileana Ladd, Student-RN Outcome: Progressing 11/02/2022 1252 by Ileana Ladd, Student-RN Outcome: Progressing   Problem: Fluid Volume: Goal: Ability to achieve a balanced intake and output will improve 11/02/2022 1259 by Ileana Ladd, Student-RN Outcome: Progressing 11/02/2022 1252 by Ileana Ladd, Student-RN Outcome: Progressing   Problem: Metabolic: Goal: Ability to maintain appropriate glucose levels will improve 11/02/2022 1259 by Ileana Ladd, Student-RN Outcome: Progressing 11/02/2022 1252 by Ileana Ladd, Student-RN Outcome: Progressing   Problem: Nutritional: Goal: Maintenance of adequate nutrition will improve 11/02/2022 1259 by Ileana Ladd, Student-RN Outcome: Progressing 11/02/2022 1252 by Ileana Ladd, Student-RN Outcome: Progressing Goal: Maintenance of adequate weight for body size and type will improve 11/02/2022 1259 by Ileana Ladd,  Student-RN Outcome: Progressing 11/02/2022 1252 by Ileana Ladd, Student-RN Outcome: Progressing   Problem: Respiratory: Goal: Will regain and/or maintain adequate ventilation 11/02/2022 1259 by Ileana Ladd, Student-RN Outcome: Progressing 11/02/2022 1252 by Ileana Ladd, Student-RN Outcome: Progressing   Problem: Urinary Elimination: Goal: Ability to achieve and maintain adequate renal perfusion and functioning will improve 11/02/2022 1259 by Ileana Ladd, Student-RN Outcome: Progressing 11/02/2022 1252 by Ileana Ladd, Student-RN Outcome: Progressing   Problem: Education: Goal: Ability to describe self-care measures that may prevent or decrease complications (Diabetes Survival Skills Education) will improve 11/02/2022 1259 by Ileana Ladd, Student-RN Outcome: Progressing 11/02/2022 1252 by Ileana Ladd, Student-RN Outcome: Progressing Goal: Individualized Educational Video(s) 11/02/2022 1259 by Ileana Ladd, Student-RN Outcome: Progressing 11/02/2022 1252 by Ileana Ladd, Student-RN Outcome: Progressing   Problem: Coping: Goal: Ability to adjust to condition or change in health will improve 11/02/2022 1259 by Ileana Ladd, Student-RN Outcome: Progressing 11/02/2022 1252 by Ileana Ladd, Student-RN Outcome: Progressing   Problem: Fluid Volume: Goal: Ability to maintain a balanced intake and output will improve 11/02/2022 1259 by Ileana Ladd, Student-RN Outcome: Progressing 11/02/2022 1252 by Ileana Ladd, Student-RN Outcome: Progressing   Problem: Health Behavior/Discharge Planning: Goal: Ability to identify and utilize available resources and services will improve 11/02/2022 1259 by Ileana Ladd, Student-RN Outcome: Progressing 11/02/2022 1252 by Ileana Ladd, Student-RN Outcome: Progressing Goal: Ability to manage health-related needs will improve 11/02/2022 1259 by Ileana Ladd, Student-RN Outcome: Progressing 11/02/2022 1252 by Ileana Ladd, Student-RN Outcome: Progressing   Problem: Metabolic: Goal: Ability to maintain appropriate glucose levels will improve 11/02/2022 1259 by Ileana Ladd, Student-RN Outcome: Progressing 11/02/2022 1252 by Ileana Ladd, Student-RN Outcome: Progressing   Problem: Nutritional: Goal: Maintenance of adequate nutrition will improve 11/02/2022 1259 by Ileana Ladd, Student-RN Outcome: Progressing 11/02/2022 1252 by Ileana Ladd, Student-RN Outcome: Progressing Goal: Progress toward achieving an optimal weight will improve 11/02/2022 1259 by Ileana Ladd, Student-RN Outcome: Progressing 11/02/2022 1252 by Ileana Ladd, Student-RN Outcome: Progressing   Problem: Skin Integrity: Goal: Risk for impaired skin integrity will decrease 11/02/2022 1259 by  Jarold Motto, Dontarius Sheley, Student-RN Outcome: Progressing 11/02/2022 1252 by Ileana Ladd, Student-RN Outcome: Progressing   Problem: Tissue Perfusion: Goal: Adequacy of tissue perfusion will improve 11/02/2022 1259 by Ileana Ladd, Student-RN Outcome: Progressing 11/02/2022 1252 by Ileana Ladd, Student-RN Outcome: Progressing   Problem: Education: Goal: Knowledge of General Education information will improve Description: Including pain rating scale, medication(s)/side effects and non-pharmacologic comfort measures 11/02/2022 1259 by Ileana Ladd, Student-RN Outcome: Progressing 11/02/2022 1252 by Ileana Ladd, Student-RN Outcome: Progressing   Problem: Health Behavior/Discharge Planning: Goal: Ability to manage health-related needs will improve 11/02/2022 1259 by Ileana Ladd, Student-RN Outcome: Progressing 11/02/2022 1252 by Ileana Ladd, Student-RN Outcome: Progressing   Problem: Clinical Measurements: Goal: Ability to maintain clinical measurements within normal limits will improve 11/02/2022 1259 by Ileana Ladd, Student-RN Outcome: Progressing 11/02/2022 1252 by Ileana Ladd, Student-RN Outcome:  Progressing Goal: Will remain free from infection 11/02/2022 1259 by Ileana Ladd, Student-RN Outcome: Progressing 11/02/2022 1252 by Ileana Ladd, Student-RN Outcome: Progressing Goal: Diagnostic test results will improve 11/02/2022 1259 by Ileana Ladd, Student-RN Outcome: Progressing 11/02/2022 1252 by Ileana Ladd, Student-RN Outcome: Progressing Goal: Respiratory complications will improve 11/02/2022 1259 by Ileana Ladd, Student-RN Outcome: Progressing 11/02/2022 1252 by Ileana Ladd, Student-RN Outcome: Progressing Goal: Cardiovascular complication will be avoided 11/02/2022 1259 by Ileana Ladd, Student-RN Outcome: Progressing 11/02/2022 1252 by Ileana Ladd, Student-RN Outcome: Progressing   Problem: Activity: Goal: Risk for activity intolerance will decrease 11/02/2022 1259 by Ileana Ladd, Student-RN Outcome: Progressing 11/02/2022 1252 by Ileana Ladd, Student-RN Outcome: Progressing   Problem: Nutrition: Goal: Adequate nutrition will be maintained 11/02/2022 1259 by Ileana Ladd, Student-RN Outcome: Progressing 11/02/2022 1252 by Ileana Ladd, Student-RN Outcome: Progressing   Problem: Coping: Goal: Level of anxiety will decrease 11/02/2022 1259 by Ileana Ladd, Student-RN Outcome: Progressing 11/02/2022 1252 by Ileana Ladd, Student-RN Outcome: Progressing   Problem: Elimination: Goal: Will not experience complications related to bowel motility 11/02/2022 1259 by Ileana Ladd, Student-RN Outcome: Progressing 11/02/2022 1252 by Ileana Ladd, Student-RN Outcome: Progressing Goal: Will not experience complications related to urinary retention 11/02/2022 1259 by Ileana Ladd, Student-RN Outcome: Progressing 11/02/2022 1252 by Ileana Ladd, Student-RN Outcome: Progressing   Problem: Pain Managment: Goal: General experience of comfort will improve 11/02/2022 1259 by Ileana Ladd, Student-RN Outcome: Progressing 11/02/2022 1252 by  Ileana Ladd, Student-RN Outcome: Progressing   Problem: Safety: Goal: Ability to remain free from injury will improve 11/02/2022 1259 by Ileana Ladd, Student-RN Outcome: Progressing 11/02/2022 1252 by Ileana Ladd, Student-RN Outcome: Progressing   Problem: Skin Integrity: Goal: Risk for impaired skin integrity will decrease 11/02/2022 1259 by Ileana Ladd, Student-RN Outcome: Progressing 11/02/2022 1252 by Ileana Ladd, Student-RN Outcome: Progressing

## 2022-11-02 NOTE — Plan of Care (Deleted)

## 2022-11-02 NOTE — Progress Notes (Signed)
Progress Note   Patient: Russell [name]:[id number] DOB: [birth year] DOA: 10/23/2022     9 DOS: the patient was seen and examined on 11/02/2022   Brief hospital course:  Russell [name] is a 57 y.o. Caucasian male with medical history significant for combined systolic and diastolic CHF, right BKA, anxiety and depression, type 2 diabetes mellitus, hypertension, dyslipidemia and morbid obesity, who presented to the emergency room with acute onset of dyspnea.  The patient was just admitted from 8/13 till 8/24 acute blood loss anemia with associated upper GI bleeding and hypovolemic shock.  His EGD showed gastritis and nonbleeding gastric ulcers.  His colonoscopy showed no active bleeding.  He received a total of 4 units of PRBCs during his hospitalization as well as IV iron.  His hemoglobin was 8.3 on 8/18.  He required a brief IV Levophed for his hypovolemic shock.  He was placed on IV insulin drip for hyperosmolar hyperglycemic state.  He currently denies any nausea or vomiting or abdominal pain or heartburn.  He did not notice any melena or bright red bleeding per rectum.  No dysuria, oliguria or hematuria or flank pain.  No chest pain or palpitations.  No cough or wheezing or hemoptysis.  No other bleeding diathesis.  He is stated that this dyspnea started from the night he was discharged home.  He took 2 aspirin that night for headache.  He has not taken any more aspirin.  He denied taking Eliquis.     ED Course: When he came to the ER, BP was 103/41 with otherwise normal vital signs.  Labs revealed hemoglobin of 3.9 hematocrit 13 with MCH of 25.8.  High sensitive troponin was 6 and later 7.  BNP was 190.6.  CMP showed sodium 132 potassium 5, glucose 313 and BUN 51 with a creatinine 1.12.  Magnesium was 2.5 and albumin 2.5 with total protein of 5.3.  Beta-hydroxybutyrate was normal at 0.21. EKG as reviewed by me : EKG showed accelerated junctional rhythm with a rate of 72 with nonspecific  interventricular conduction delay. Imaging: Portable chest x-ray showed stable cardiomegaly and vascular congestion and improved left lung base opacity.   The patient was typed and crossmatch and will be transfused 2 units of packed red blood cells.  He was given 4 mg of IV morphine sulfate, 80 mg of IV Protonix bolus followed by IV drip.  He will be admitted to an observation progressive unit bed for further evaluation and management.     Assessment and Plan:  Acute blood loss anemia:  Secondary to recurrent upper GI bleed from NSAID induced peptic ulcer disease Had an upper endoscopy with healed gastric ulcers.  Nonbleeding superficial duodenal ulcer Status post transfusion of 7 units of packed RBC since this hospitalization. Hemoglobin is stable at 7.4 Continue to hold aspirin, plavix.  Monitor H&H closely and transfuse as needed       Urinary retention Patient unable to void and had 2 In-N-Out catheterization with over a liter of urine drained Chart review shows he had urinary retention during his last hospitalization from 08/14 to 08/20 He had a voiding trial done and was able to void and was discharged home Foley catheter has been placed and patient will likely be discharged with same to follow-up with urology as an outpatient Continue Flomax 0.4 mg daily         Hyponatremia:  Resolved      HLD:  continue on statin      DM2: likely  poorly controlled.  Blood sugars are stable Continue consistent carbohydrate diet Continue on levemir,  SSI w/ accuchecks      Peripheral arterial disease /Hx of right BKA:  continue w/ supportive care      Peripheral neuropathy:  continue on home dose of pregabalin    Depression: severity unknown.  Continue on home dose of duloxetine. Holding nortriptyline  Appreciate psych input.  Recommends adding Wellbutrin XL 50 mg daily   Morbid obesity: BMI 52.2.  Complicates overall prognosis and care   Generalized weakness:  PT/OT  consulted.  They recommend skilled nursing facility on discharge           Subjective: Patient is seen and examined at the bedside.  Complains of significant weight gain and inability to fit his prosthesis.  Physical Exam: Vitals:   11/02/22 1134 11/02/22 1200 11/02/22 1300 11/02/22 1400  BP: (!) 124/58     Pulse: 73     Resp:  (!) 21 14 16   Temp: 98.3 F (36.8 C)     TempSrc: Oral     SpO2: 98%     Weight:      Height:       General exam: appears comfortable. Morbidly obese Respiratory system: decreased breath sounds b/l  Cardiovascular system: S1 & S2+. No rubs or clicks  Gastrointestinal system: abd is soft, NT, obese & normal bowel sounds  Central nervous system: alert & oriented. Moves all extremities  Psychiatry: judgement and insight appears normal. Flat mood and affect   Data Reviewed: Labs reviewed There are no new results to review at this time.  Family Communication: Plan of care discussed with patient in detail  Disposition: Status is: Inpatient Remains inpatient appropriate because: Awaiting discharge to skilled nursing facility  Planned Discharge Destination: Skilled nursing facility    Time spent: 32 minutes  Author: Lucile Shutters, MD 11/02/2022 2:24 PM  For on call review [email or web address].

## 2022-11-02 NOTE — Progress Notes (Addendum)
Physical Therapy Treatment Patient Details Name: ARLIND ROGAN MRN: [id number] DOB: January 30, 1966 Today's Date: 11/02/2022   History of Present Illness LABARRON PIANKA is a 57 y.o. Caucasian male with medical history significant for combined systolic and diastolic CHF, right BKA, anxiety and depression, type 2 diabetes mellitus, hypertension, dyslipidemia and morbid obesity, who presented to the emergency room with acute onset of dyspnea.  The patient was just admitted from 8/13 till 8/24 acute blood loss anemia with associated upper GI bleeding and hypovolemic shock.  His EGD showed gastritis and nonbleeding gastric ulcers.  His colonoscopy showed no active bleeding.  He received a total of 4 units of PRBCs during his hospitalization as well as IV iron. Current MD assessment includes: Acute blood loss anemia, secondary to upper GI bleed.    PT Comments  Pt presents seated EOB, some pain in L knee. Pt performed sit<>stand with minAx2 for safety from an elevated surface, requiring increased time/effort. Pt attempted donning prosthesis during transfer but was unable to achieve a secure suction due to significant RLE swelling, requiring maxA from therapist to doff. Pt unable to tolerate standing activities without prosthesis due to significant L knee pain, but willing to perform therex EOB. Pt overall motivated to participate, but mobility is currently limited due to ill-fitting prosthesis and L knee pain. He would benefit from continued skilled therapy to maximize functional activities.    If plan is discharge home, recommend the following: Assistance with cooking/housework;Help with stairs or ramp for entrance;Assist for transportation;Direct supervision/assist for medications management;A lot of help with bathing/dressing/bathroom;A lot of help with walking and/or transfers   Can travel by private vehicle     No  Equipment Recommendations  Other (comment) (TBD at next venue of care)    Recommendations  for Other Services       Precautions / Restrictions Precautions Precautions: Fall Required Braces or Orthoses: Other Brace Other Brace: RLE prosthetic in room Restrictions Weight Bearing Restrictions: No RLE Weight Bearing:  (previous R BKA) LLE Weight Bearing: Weight bearing as tolerated     Mobility  Bed Mobility               General bed mobility comments: not observed during session    Transfers Overall transfer level: Needs assistance Equipment used: Rolling walker (2 wheels) Transfers: Sit to/from Stand Sit to Stand: Min assist, +2 safety/equipment, +2 physical assistance, From elevated surface           General transfer comment: Increased time/ effort to secure prosthesis in standing. Unable to secure suction due to increased fluid in RLE, requiring maxA for removal of prosthesis in sitting    Ambulation/Gait               General Gait Details: unable to at this time due to ill-fitting prosthesis   Stairs             Wheelchair Mobility     Tilt Bed    Modified Rankin (Stroke Patients Only)       Balance Overall balance assessment: Needs assistance Sitting-balance support: Feet supported, Single extremity supported Sitting balance-Leahy Scale: Good Sitting balance - Comments: able to don prosthesis in sitting and dangle EOB without LOB Postural control: Posterior lean Standing balance support: Bilateral upper extremity supported, During functional activity, Reliant on assistive device for balance Standing balance-Leahy Scale: Fair  Cognition Arousal: Alert Behavior During Therapy: WFL for tasks assessed/performed Overall Cognitive Status: Within Functional Limits for tasks assessed                                          Exercises General Exercises - Lower Extremity Long Arc Quad: AROM, Both, 10 reps, Seated Hip Flexion/Marching: AROM, Right, 10 reps, Left, 5 reps,  Seated    General Comments General comments (skin integrity, edema, etc.): Pt reported feeling "off" and very fatigued, BP 109/51, HR 75bpm. Pt motivated to participate but limited due to R knee pain and RLE swelling      Pertinent Vitals/Pain Pain Assessment Pain Assessment: Faces Faces Pain Scale: Hurts little more Pain Location: L knee Pain Descriptors / Indicators: Discomfort, Grimacing, Constant Pain Intervention(s): Monitored during session, Limited activity within patient's tolerance    Home Living                          Prior Function            PT Goals (current goals can now be found in the care plan section) Progress towards PT goals: Progressing toward goals    Frequency    Min 1X/week      PT Plan      Co-evaluation              AM-PAC PT "6 Clicks" Mobility   Outcome Measure  Help needed turning from your back to your side while in a flat bed without using bedrails?: A Little Help needed moving from lying on your back to sitting on the side of a flat bed without using bedrails?: A Little Help needed moving to and from a bed to a chair (including a wheelchair)?: A Little Help needed standing up from a chair using your arms (e.g., wheelchair or bedside chair)?: A Little Help needed to walk in hospital room?: A Lot Help needed climbing 3-5 steps with a railing? : A Lot 6 Click Score: 16    End of Session Equipment Utilized During Treatment: Gait belt Activity Tolerance: Patient limited by fatigue;Patient limited by pain Patient left: in bed;with bed alarm set;with nursing/sitter in room (seated EOB)   PT Visit Diagnosis: Muscle weakness (generalized) (M62.81);Difficulty in walking, not elsewhere classified (R26.2)     Time: 1610-9604 PT Time Calculation (min) (ACUTE ONLY): 28 min  Charges:    $Therapeutic Activity: 23-37 mins PT General Charges $$ ACUTE PT VISIT: 1 Visit                    Rica Heather, PT, SPT 11:29  AM,11/02/22

## 2022-11-02 NOTE — Progress Notes (Signed)
Occupational Therapy Treatment Patient Details Name: Russell [name] MRN: [id number] DOB: 08/11/1965 Today's Date: 11/02/2022   History of present illness Russell [name] is a 57 y.o. Caucasian male with medical history significant for combined systolic and diastolic CHF, right BKA, anxiety and depression, type 2 diabetes mellitus, hypertension, dyslipidemia and morbid obesity, who presented to the emergency room with acute onset of dyspnea.  The patient was just admitted from 8/13 till 8/24 acute blood loss anemia with associated upper GI bleeding and hypovolemic shock.  His EGD showed gastritis and nonbleeding gastric ulcers.  His colonoscopy showed no active bleeding.  He received a total of 4 units of PRBCs during his hospitalization as well as IV iron. Current MD assessment includes: Acute blood loss anemia, secondary to upper GI bleed.   OT comments  Pt seen for OT tx this date. Pt received seated EOB and agreeable to session. Pt reports sitting EOB >3hrs today and also reports complaints of ongoing BLE swelling. Pt educated in benefits of alternating seated EOB and elevating BLE to support edema mgt. Pt notes he had been sitting up for 3+ hrs EOB and LLE significantly tight/swollen. Pt verbalized understanding. Pt completed lateral scoots EOB with supervision and increased effort/time to complete, heavy use of bed rails to return to bed. Able to roll side to side with assist only for smoothing out linens underneath him for improved comfort and skin integrity. RLE prosthesis no fitting today due to swelling. Bed positioning adjusted to elevate BLE. Pt declined use of SCDs. Pt continues to benefit from skilled OT servicse.       If plan is discharge home, recommend the following:  A little help with walking and/or transfers;A little help with bathing/dressing/bathroom;Assistance with cooking/housework;Direct supervision/assist for medications management;Direct supervision/assist for financial  management;Assist for transportation;Help with stairs or ramp for entrance   Equipment Recommendations  Other (comment) (defer to next venue)    Recommendations for Other Services      Precautions / Restrictions Precautions Precautions: Fall Required Braces or Orthoses: Other Brace Other Brace: RLE prosthetic in room       Mobility Bed Mobility Overal bed mobility: Modified Independent             General bed mobility comments: increased effort, time, and heavy bed rail use but able to complete without direct assist    Transfers Overall transfer level: Needs assistance Equipment used: None Transfers: Bed to chair/wheelchair/BSC            Lateral/Scoot Transfers: Supervision General transfer comment: +time/effort for lateral scoots along the EOB (prosthesis not fitting this date)     Balance Overall balance assessment: Needs assistance Sitting-balance support: Feet unsupported, Feet supported, No upper extremity supported Sitting balance-Leahy Scale: Good                                     ADL either performed or assessed with clinical judgement   ADL Overall ADL's : Needs assistance/impaired     Grooming: Sitting;Set up;Wash/dry face                                      Extremity/Trunk Assessment              Vision       Perception     Praxis  Cognition Arousal: Alert Behavior During Therapy: WFL for tasks assessed/performed Overall Cognitive Status: Within Functional Limits for tasks assessed                                          Exercises Other Exercises Other Exercises: Pt educated in benefits of alternating seated EOB and elevating BLE to support edema mgt. Pt notes he had been sitting up for 3+ hrs EOB and LLE significantly tight/swollen.    Shoulder Instructions       General Comments      Pertinent Vitals/ Pain       Pain Assessment Pain Assessment: Faces Faces  Pain Scale: Hurts a little bit Pain Location: L knee Pain Descriptors / Indicators: Grimacing Pain Intervention(s): Limited activity within patient's tolerance, Monitored during session  Home Living                                          Prior Functioning/Environment              Frequency  Min 1X/week        Progress Toward Goals  OT Goals(current goals can now be found in the care plan section)  Progress towards OT goals: Progressing toward goals  Acute Rehab OT Goals Patient Stated Goal: to be near family OT Goal Formulation: With patient Time For Goal Achievement: 11/10/22 Potential to Achieve Goals: Good  Plan      Co-evaluation                 AM-PAC OT "6 Clicks" Daily Activity     Outcome Measure   Help from another person eating meals?: None Help from another person taking care of personal grooming?: None Help from another person toileting, which includes using toliet, bedpan, or urinal?: A Lot Help from another person bathing (including washing, rinsing, drying)?: A Little Help from another person to put on and taking off regular upper body clothing?: A Little Help from another person to put on and taking off regular lower body clothing?: A Little 6 Click Score: 19    End of Session    OT Visit Diagnosis: Muscle weakness (generalized) (M62.81);Other abnormalities of gait and mobility (R26.89)   Activity Tolerance Patient tolerated treatment well   Patient Left in bed;with call bell/phone within reach;with bed alarm set   Nurse Communication          Time: 2952-8413 OT Time Calculation (min): 19 min  Charges: OT General Charges $OT Visit: 1 Visit OT Treatments $Therapeutic Activity: 8-22 mins  Arman Filter., MPH, MS, OTR/L ascom [phone] 11/02/22, 2:12 PM

## 2022-11-02 NOTE — TOC Progression Note (Signed)
Transition of Care [facility]) - Progression Note    Patient Details  Name: Russell [name] MRN: [id number] Date of Birth: 02-Feb-1966  Transition of Care [facility]) CM/SW Contact  Truddie Hidden, RN Phone Number: 11/02/2022, 3:12 PM  Clinical Narrative:    Sherron Monday with Tiffany at [facility] regarding referral. A decision has not been made about the bed offer. She will have it reviewed and will make a decision.   1:00pm Patient provided bedside offer [facility]. Patient is agreeable to bed offer. He has been advised Berk[facility] Harvey will still need approval. Patient requested at discharge for EMS stop by his home prior to transporting him to facility. He has been advise coordination to pick up belongings from his home prior to transport to the SNF is unavailable.   Expected Discharge Plan: Home/Self Care Barriers to Discharge: Continued Medical Work up  Expected Discharge Plan and Services       Living arrangements for the past 2 months: Single Family Home                                       Social Determinants of Health (SDOH) Interventions SDOH Screenings   Food Insecurity: No Food Insecurity (10/24/2022)  Housing: Low Risk  (10/24/2022)  Transportation Needs: No Transportation Needs (10/24/2022)  Utilities: Not At Risk (10/24/2022)  Financial Resource Strain: Medium Risk (06/17/2022)   Received from [facility]  Tobacco Use: Low Risk  (10/26/2022)    Readmission Risk Interventions    10/25/2022    3:55 PM  Readmission Risk Prevention Plan  Transportation Screening Complete  PCP or Specialist Appt within 3-5 Days Complete  HRI or Home Care Consult Complete  Social Work Consult for Recovery Care Planning/Counseling Complete  Palliative Care Screening Not Applicable  Medication Review [employer]) Complete

## 2022-11-03 ENCOUNTER — Inpatient Hospital Stay: Payer: 59

## 2022-11-03 DIAGNOSIS — D62 Acute posthemorrhagic anemia: Secondary | ICD-10-CM | POA: Diagnosis not present

## 2022-11-03 LAB — GLUCOSE, CAPILLARY
Glucose-Capillary: 106 mg/dL — ABNORMAL HIGH (ref 70–99)
Glucose-Capillary: 84 mg/dL (ref 70–99)
Glucose-Capillary: 98 mg/dL (ref 70–99)
Glucose-Capillary: 138 mg/dL — ABNORMAL HIGH (ref 70–99)
Glucose-Capillary: 129 mg/dL — ABNORMAL HIGH (ref 70–99)

## 2022-11-03 LAB — BASIC METABOLIC PANEL
Anion gap: 8 (ref 5–15)
BUN: 15 mg/dL (ref 6–20)
CO2: 29 mmol/L (ref 22–32)
Calcium: 7.9 mg/dL — ABNORMAL LOW (ref 8.9–10.3)
Chloride: 102 mmol/L (ref 98–111)
Creatinine, Ser: 0.67 mg/dL (ref 0.61–1.24)
GFR, Estimated: >60 mL/min (ref >60)
Glucose, Bld: 81 mg/dL (ref 70–99)
Potassium: 3.9 mmol/L (ref 3.5–5.1)
Sodium: 139 mmol/L (ref 135–145)

## 2022-11-03 LAB — HEMOGLOBIN AND HEMATOCRIT, BLOOD
HCT: 25.8 % — ABNORMAL LOW (ref 39.0–52.0)
Hemoglobin: 7.8 g/dL — ABNORMAL LOW (ref 13.0–17.0)

## 2022-11-03 MED ORDER — DIPHENHYDRAMINE HCL 25 MG PO CAPS
25.0000 mg | ORAL_CAPSULE | Freq: Three times a day (TID) | ORAL | Status: DC | PRN
  Administered 2022-11-03: 25 mg via ORAL
  Filled 2022-11-03: qty 1

## 2022-11-03 MED ORDER — INSULIN ASPART 100 UNIT/ML IJ SOLN
0.0000 [IU] | Freq: Three times a day (TID) | INTRAMUSCULAR | Status: DC
  Administered 2022-11-03: 1 [IU] via SUBCUTANEOUS
  Administered 2022-11-05: 2 [IU] via SUBCUTANEOUS
  Administered 2022-11-06: 1 [IU] via SUBCUTANEOUS
  Administered 2022-11-06 (×2): 2 [IU] via SUBCUTANEOUS
  Administered 2022-11-07 – 2022-11-08 (×2): 1 [IU] via SUBCUTANEOUS
  Administered 2022-11-08: 2 [IU] via SUBCUTANEOUS
  Filled 2022-11-03 (×10): qty 1
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
## 2022-11-03 NOTE — Progress Notes (Addendum)
Physical Therapy Treatment Patient Details Name: Russell [name] MRN: [id number] DOB: 1965-12-03 Today's Date: 11/03/2022   History of Present Illness Russell [name] is a 57 y.o. Caucasian male with medical history significant for combined systolic and diastolic CHF, right BKA, anxiety and depression, type 2 diabetes mellitus, hypertension, dyslipidemia and morbid obesity, who presented to the emergency room with acute onset of dyspnea.  The patient was just admitted from 8/13 till 8/24 acute blood loss anemia with associated upper GI bleeding and hypovolemic shock.  His EGD showed gastritis and nonbleeding gastric ulcers.  His colonoscopy showed no active bleeding.  He received a total of 4 units of PRBCs during his hospitalization as well as IV iron. Current MD assessment includes: Acute blood loss anemia, secondary to upper GI bleed.    PT Comments  Pt presents laying in bed, 8/10 pain in L knee that worsens with movement. PT/OT cotreat attempted to maximize functional abilities but pt declined OOB activities due to current pain levels. Pt agreeable to some bed level exercises. PT maximized treatment by educating on LE positioning to address LE swelling and importance of regular EOB/OOB mobility.    If plan is discharge home, recommend the following: Assistance with cooking/housework;Help with stairs or ramp for entrance;Assist for transportation;Direct supervision/assist for medications management;Two people to help with bathing/dressing/bathroom;Two people to help with walking and/or transfers   Can travel by private vehicle     No  Equipment Recommendations  Other (comment) (TBD at next venue)    Recommendations for Other Services       Precautions / Restrictions Precautions Precautions: Fall Required Braces or Orthoses: Other Brace Other Brace: RLE prosthetic in room Restrictions Weight Bearing Restrictions: No     Mobility  Bed Mobility                    Transfers                         Ambulation/Gait                   Stairs             Wheelchair Mobility     Tilt Bed    Modified Rankin (Stroke Patients Only)       Balance                                            Cognition Arousal: Alert Behavior During Therapy: WFL for tasks assessed/performed Overall Cognitive Status: Within Functional Limits for tasks assessed                                          Exercises General Exercises - Lower Extremity Quad Sets: AROM, Right, 10 reps, Supine Straight Leg Raises: AROM, Right, 10 reps, Supine Other Exercises Other Exercises: PT reviewed positioning strategies to address LE swelling and educated on regular mobility.    General Comments        Pertinent Vitals/Pain Pain Assessment Pain Assessment: 0-10 Pain Score: 8  Pain Location: L knee Pain Descriptors / Indicators: Discomfort, Grimacing Pain Intervention(s): Monitored during session, Limited activity within patient's tolerance    Home Living  Prior Function            PT Goals (current goals can now be found in the care plan section) Progress towards PT goals: Progressing toward goals    Frequency    Min 1X/week      PT Plan      Co-evaluation              AM-PAC PT "6 Clicks" Mobility   Outcome Measure  Help needed turning from your back to your side while in a flat bed without using bedrails?: A Lot Help needed moving from lying on your back to sitting on the side of a flat bed without using bedrails?: A Lot Help needed moving to and from a bed to a chair (including a wheelchair)?: Total Help needed standing up from a chair using your arms (e.g., wheelchair or bedside chair)?: Total Help needed to walk in hospital room?: Total Help needed climbing 3-5 steps with a railing? : Total 6 Click Score: 8    End of Session Equipment Utilized During  Treatment: Gait belt Activity Tolerance: Patient limited by pain Patient left: in bed;with call bell/phone within reach;with bed alarm set;Other (comment) (left with OT) Nurse Communication: Other (comment) (RN notified/aware of pain levels and pt requests pain meds) PT Visit Diagnosis: Muscle weakness (generalized) (M62.81);Difficulty in walking, not elsewhere classified (R26.2)     Time: 8295-6213 PT Time Calculation (min) (ACUTE ONLY): 18 min  Charges:    $Therapeutic Exercise: 8-22 mins PT General Charges $$ ACUTE PT VISIT: 1 Visit                     Loyd Salvador, PT, SPT 4:03 PM,11/03/22

## 2022-11-03 NOTE — Progress Notes (Signed)
PROGRESS NOTE    Russell [name]  ZOX:[id number] DOB: 03-11-1965 DOA: 10/23/2022 PCP: [name], MD    Assessment & Plan:   Principal Problem:   Acute blood loss anemia Active Problems:   Acute urinary retention   Dyslipidemia   Type 2 diabetes mellitus with peripheral neuropathy (HCC)   Anxiety and depression   Essential hypertension   Acute gastric ulcer with hemorrhage   Major depressive disorder, recurrent episode, moderate (HCC)  Assessment and Plan:  Acute blood loss anemia: secondary to recurrent upper GI bleed from NSAID induced peptic ulcer disease. S/p EGD w/ healed gastric ulcers.  Nonbleeding superficial duodenal ulcer. S/p 7 units of pRBCs transfused. Holding aspirin, plavix. Continue to monitor H&H    Urinary retention: d/c foley and do a voiding trial 11/03/22. Continue on flomax. If pt is unable to urinate w/ place foley again and pt will be d/c w/ foley & outpatient f/u w/ urology   Hyponatremia: resolved    HLD: continue on statin   DM2: likely poorly controlled. Continue on levemir, SSI w/ accuchecks   PAD: w/ hx of right BKA.Continue w/ supportive care   Peripheral neuropathy: continue on home dose of pregabalin  Depression: severity unknown. Continue on home dose of duloxetine and continue on wellbutrin. Holding nortriptyline    Morbid obesity: BMI 52.2. Complicates overall care & prognosis    Generalized weakness: PT/OT recs SNF. Waiting on insurance auth      DVT prophylaxis:  SCDs Code Status: full  Family Communication:  Disposition Plan: d/c to SNF. Waiting on insurance auth  Level of care: Progressive Status is: Inpatient Remains inpatient appropriate because: medically stable. Needs insurance auth    Consultants:  GI   Procedures:   Antimicrobials:   Subjective: Pt c/o left knee pain   Objective: Vitals:   11/02/22 2306 11/02/22 2310 11/03/22 0329 11/03/22 0743  BP: (!) 116/53  118/68 122/60  Pulse: 73  60 71   Resp: 18  16 16   Temp: 98.7 F (37.1 C)  98.6 F (37 C) 97.9 F (36.6 C)  TempSrc: Oral  Oral Oral  SpO2: (!) 87% 93% 100% 95%  Weight:   (!) 172.8 kg   Height:        Intake/Output Summary (Last 24 hours) at 11/03/2022 0836 Last data filed at 11/03/2022 0338 Gross per 24 hour  Intake 240 ml  Output 2055 ml  Net -1815 ml   Filed Weights   10/23/22 2209 11/03/22 0329  Weight: (!) 170 kg (!) 172.8 kg    Examination:  General exam: Appears calm but uncomfortable. Morbidly obese Respiratory system: decreased breath sounds  Cardiovascular system: S1 & S2+. No  rubs, gallops or clicks. No pedal edema. Gastrointestinal system: Abdomen is obese, soft and nontender. . Normal bowel sounds heard. Central nervous system: Alert and oriented.  Psychiatry: Judgement and insight appear normal. Flat mood and affect    Data Reviewed: I have personally reviewed following labs and imaging studies  CBC: Recent Labs  Lab 10/28/22 0833 10/30/22 0632 10/31/22 0559 11/01/22 0955 11/03/22 0544  WBC 4.8 3.5* 4.0 4.6  --   HGB 7.2* 6.8* 7.3* 7.4* 7.8*  HCT 23.5* 22.4* 23.5* 23.8* 25.8*  MCV 83.9 84.2 82.2 84.1  --   PLT 199 191 193 179  --    Basic Metabolic Panel: Recent Labs  Lab 10/28/22 0833 10/30/22 0632 11/01/22 0955 11/03/22 0544  NA 133* 135 137 139  K 4.3 3.7 4.2 3.9  CL 102 101 103 102  CO2 25 27 26 29   GLUCOSE 246* 178* 156* 81  BUN 12 15 15 15   CREATININE 0.92 0.93 0.83 0.67  CALCIUM 7.8* 7.8* 7.8* 7.9*   GFR: Estimated Creatinine Clearance: 162.7 mL/min (by C-G formula based on SCr of 0.67 mg/dL). Liver Function Tests: No results for input(s): "AST", "ALT", "ALKPHOS", "BILITOT", "PROT", "ALBUMIN" in the last 168 hours. No results for input(s): "LIPASE", "AMYLASE" in the last 168 hours. No results for input(s): "AMMONIA" in the last 168 hours. Coagulation Profile: No results for input(s): "INR", "PROTIME" in the last 168 hours. Cardiac Enzymes: No results for  input(s): "CKTOTAL", "CKMB", "CKMBINDEX", "TROPONINI" in the last 168 hours. BNP (last 3 results) No results for input(s): "PROBNP" in the last 8760 hours. HbA1C: No results for input(s): "HGBA1C" in the last 72 hours. CBG: Recent Labs  Lab 11/02/22 1603 11/02/22 2003 11/02/22 2308 11/03/22 0332 11/03/22 0744  GLUCAP 152* 250* 182* 106* 84   Lipid Profile: No results for input(s): "CHOL", "HDL", "LDLCALC", "TRIG", "CHOLHDL", "LDLDIRECT" in the last 72 hours. Thyroid Function Tests: No results for input(s): "TSH", "T4TOTAL", "FREET4", "T3FREE", "THYROIDAB" in the last 72 hours. Anemia Panel: No results for input(s): "VITAMINB12", "FOLATE", "FERRITIN", "TIBC", "IRON", "RETICCTPCT" in the last 72 hours. Sepsis Labs: No results for input(s): "PROCALCITON", "LATICACIDVEN" in the last 168 hours.  No results found for this or any previous visit (from the past 240 hour(s)).       Radiology Studies: No results found.      Scheduled Meds:  sodium chloride   Intravenous Once   sodium chloride   Intravenous Once   sodium chloride   Intravenous Once   atorvastatin  40 mg Oral Daily   buPROPion  150 mg Oral Daily   Chlorhexidine Gluconate Cloth  6 each Topical Daily   DULoxetine  60 mg Oral Daily   furosemide  40 mg Oral BID   guaiFENesin  1,200 mg Oral BID   insulin aspart  0-9 Units Subcutaneous TID AC & HS   insulin detemir  42 Units Subcutaneous BID   loratadine  10 mg Oral Daily   melatonin  5 mg Oral QHS   pantoprazole  40 mg Oral BID   polyethylene glycol  17 g Oral Daily   pregabalin  150 mg Oral TID   sodium chloride flush  10-40 mL Intracatheter Q12H   tamsulosin  0.4 mg Oral QPC breakfast   Continuous Infusions:   LOS: 10 days    Time spent: 35 mins     Charise Killian, MD [facility] Pager [phone]  If 7PM-7AM, please contact night-coverage [email or web address] 11/03/2022, 8:36 AM

## 2022-11-03 NOTE — TOC Progression Note (Signed)
Transition of Care [facility]) - Progression Note    Patient Details  Name: ZETH SODERGREN MRN: [id number] Date of Birth: Mar 26, 1965  Transition of Care [facility]) CM/SW Contact  Truddie Hidden, RN Phone Number: 11/03/2022, 2:19 PM  Clinical Narrative:    Vesta Mixer started    Expected Discharge Plan: Home/Self Care Barriers to Discharge: Continued Medical Work up  Expected Discharge Plan and Services       Living arrangements for the past 2 months: Single Family Home                                       Social Determinants of Health (SDOH) Interventions SDOH Screenings   Food Insecurity: No Food Insecurity (10/24/2022)  Housing: Low Risk  (10/24/2022)  Transportation Needs: No Transportation Needs (10/24/2022)  Utilities: Not At Risk (10/24/2022)  Financial Resource Strain: Medium Risk (06/17/2022)   Received from [facility]  Tobacco Use: Low Risk  (10/26/2022)    Readmission Risk Interventions    10/25/2022    3:55 PM  Readmission Risk Prevention Plan  Transportation Screening Complete  PCP or Specialist Appt within 3-5 Days Complete  HRI or Home Care Consult Complete  Social Work Consult for Recovery Care Planning/Counseling Complete  Palliative Care Screening Not Applicable  Medication Review [facility]) Complete

## 2022-11-03 NOTE — Plan of Care (Signed)
Progressing

## 2022-11-04 ENCOUNTER — Ambulatory Visit: Payer: Self-pay | Admitting: Urology

## 2022-11-04 DIAGNOSIS — D62 Acute posthemorrhagic anemia: Secondary | ICD-10-CM | POA: Diagnosis not present

## 2022-11-04 LAB — BASIC METABOLIC PANEL
Anion gap: 7 (ref 5–15)
BUN: 15 mg/dL (ref 6–20)
CO2: 28 mmol/L (ref 22–32)
Calcium: 7.4 mg/dL — ABNORMAL LOW (ref 8.9–10.3)
Chloride: 101 mmol/L (ref 98–111)
Creatinine, Ser: 0.87 mg/dL (ref 0.61–1.24)
GFR, Estimated: >60 mL/min (ref >60)
Glucose, Bld: 141 mg/dL — ABNORMAL HIGH (ref 70–99)
Potassium: 4 mmol/L (ref 3.5–5.1)
Sodium: 136 mmol/L (ref 135–145)

## 2022-11-04 LAB — CBC
HCT: 23.8 % — ABNORMAL LOW (ref 39.0–52.0)
Hemoglobin: 7.2 g/dL — ABNORMAL LOW (ref 13.0–17.0)
MCH: 24.9 pg — ABNORMAL LOW (ref 26.0–34.0)
MCHC: 30.3 g/dL (ref 30.0–36.0)
MCV: 82.4 fL (ref 80.0–100.0)
Platelets: 172 10*3/uL (ref 150–400)
RBC: 2.89 MIL/uL — ABNORMAL LOW (ref 4.22–5.81)
RDW: 15.6 % — ABNORMAL HIGH (ref 11.5–15.5)
WBC: 5.5 10*3/uL (ref 4.0–10.5)
nRBC: 0.9 % — ABNORMAL HIGH (ref 0.0–0.2)

## 2022-11-04 LAB — GLUCOSE, CAPILLARY
Glucose-Capillary: 110 mg/dL — ABNORMAL HIGH (ref 70–99)
Glucose-Capillary: 106 mg/dL — ABNORMAL HIGH (ref 70–99)

## 2022-11-04 NOTE — Progress Notes (Signed)
Patient had Foley catheter removed at 6pm on 11/03/2022, Patient has not been able to urinate since. Patient had foley for acute urinary retention. Patient bladder scan is unsuccessful due to multiple hernia's, obesity and mass noted in that area. Patient bladder scans prior to foley insertion where unsuccessful. Dr. Arville Care notified in and out cath ordered. 600 cc, clear yellow in color urine noted. Patient stated that he feels pressure and pain in lower abdomen. Patient resting in bed at this time. Bed in lowest position, call light within reach. Needs met at this time.

## 2022-11-04 NOTE — Plan of Care (Signed)

## 2022-11-04 NOTE — TOC Progression Note (Signed)
Transition of Care [facility]) - Progression Note    Patient Details  Name: Russell [name] MRN: [id number] Date of Birth: Feb 23, 1966  Transition of Care [facility]) CM/SW Contact  Truddie Hidden, RN Phone Number: 11/04/2022, 2:20 PM  Clinical Narrative:    Vesta Mixer approved for 9/5-9/7. MD and facility notified.  Patient has a [place] PASSR [id number] E through 9/29  Patient is requesting to have his electric wheelchair at facility. RNCM spoke with [facility] in admissions . Patient is allowed to have his chair but will have to prove  with the facility physical therapist he can uses it safely.   Patient informed he will be required to have his chair transported to the facility and  will have to be approved before using it.      Expected Discharge Plan: Home/Self Care Barriers to Discharge: Continued Medical Work up  Expected Discharge Plan and Services       Living arrangements for the past 2 months: Single Family Home                                       Social Determinants of Health (SDOH) Interventions SDOH Screenings   Food Insecurity: No Food Insecurity (10/24/2022)  Housing: Low Risk  (10/24/2022)  Transportation Needs: No Transportation Needs (10/24/2022)  Utilities: Not At Risk (10/24/2022)  Financial Resource Strain: Medium Risk (06/17/2022)   Received from [facility]  Tobacco Use: Low Risk  (10/26/2022)    Readmission Risk Interventions    10/25/2022    3:55 PM  Readmission Risk Prevention Plan  Transportation Screening Complete  PCP or Specialist Appt within 3-5 Days Complete  HRI or Home Care Consult Complete  Social Work Consult for Recovery Care Planning/Counseling Complete  Palliative Care Screening Not Applicable  Medication Review Oceanographer) Complete

## 2022-11-04 NOTE — Progress Notes (Signed)
PROGRESS NOTE    Russell [name]  [id number] DOB: [birth year] DOA: 10/23/2022 PCP: [name], MD    Assessment & Plan:   Principal Problem:   Acute blood loss anemia Active Problems:   Acute urinary retention   Dyslipidemia   Type 2 diabetes mellitus with peripheral neuropathy (HCC)   Anxiety and depression   Essential hypertension   Acute gastric ulcer with hemorrhage   Major depressive disorder, recurrent episode, moderate (HCC)  Assessment and Plan:  Acute blood loss anemia: secondary to recurrent upper GI bleed from NSAID induced peptic ulcer disease. S/p EGD w/ healed gastric ulcers.  Nonbleeding superficial duodenal ulcer. S/p 7 units of pRBCs transfused. Holding plavix, aspirin. Will continue to monitor H&H    Urinary retention: unable to void for voiding trial so foley was placed again 11/04/22. Will be d/c w/ foley and f/u outpatient w/ urology   Hyponatremia: resolved    HLD: continue on statin   DM2: likely poorly controlled. Continue on levemir, SSI w/ accuchecks    PAD: w/ hx of right BKA. Continue w/ supportive care   Peripheral neuropathy: continue on home dose of pregabalin   Depression: severity unknown. Continue on home dose of duloxetine & continue on wellbutrin. Holding nortriptyline    Morbid obesity: BMI 52.2. Complicates overall care & prognosis     Generalized weakness: PT/OT recs SNF      DVT prophylaxis:  SCDs Code Status: full  Family Communication:  Disposition Plan: d/c to SNF.   Level of care: Progressive Status is: Inpatient Remains inpatient appropriate because: d/c tomorrow to SNF as pt stated he needed a day to set up his wheelchair to be picked up from his home     Consultants:  GI   Procedures:   Antimicrobials:   Subjective: Pt c/o malaise   Objective: Vitals:   11/03/22 2018 11/03/22 2324 11/04/22 0514 11/04/22 0842  BP: (!) 102/49 (!) 102/49 117/61 (!) 102/44  Pulse: 78 77 87 76  Resp: 18 18 18  16   Temp: 98.1 F (36.7 C) 97.9 F (36.6 C) 98.1 F (36.7 C) 97.7 F (36.5 C)  TempSrc:    Oral  SpO2: 97% (!) 89% 96% 93%  Weight:      Height:        Intake/Output Summary (Last 24 hours) at 11/04/2022 0847 Last data filed at 11/03/2022 2009 Gross per 24 hour  Intake 960 ml  Output 250 ml  Net 710 ml   Filed Weights   10/23/22 2209 11/03/22 0329  Weight: (!) 170 kg (!) 172.8 kg    Examination:  General exam: Appears comfortable. Morbidly obese Respiratory system: decreased breaths sounds b/l Cardiovascular system: S1/S2+. No rubs or clicks  Gastrointestinal system: Abd is soft, NT, obese & hypoactive bowel sounds  Central nervous system: alert & oriented  Psychiatry: Judgement and insight appears at baseline. Flat mood and affect    Data Reviewed: I have personally reviewed following labs and imaging studies  CBC: Recent Labs  Lab 10/30/22 0632 10/31/22 0559 11/01/22 0955 11/03/22 0544 11/04/22 0529  WBC 3.5* 4.0 4.6  --  5.5  HGB 6.8* 7.3* 7.4* 7.8* 7.2*  HCT 22.4* 23.5* 23.8* 25.8* 23.8*  MCV 84.2 82.2 84.1  --  82.4  PLT 191 193 179  --  172   Basic Metabolic Panel: Recent Labs  Lab 10/30/22 0632 11/01/22 0955 11/03/22 0544 11/04/22 0529  NA 135 137 139 136  K 3.7 4.2 3.9 4.0  CL  101 103 102 101  CO2 27 26 29 28   GLUCOSE 178* 156* 81 141*  BUN 15 15 15 15   CREATININE 0.93 0.83 0.67 0.87  CALCIUM 7.8* 7.8* 7.9* 7.4*   GFR: Estimated Creatinine Clearance: 149.6 mL/min (by C-G formula based on SCr of 0.87 mg/dL). Liver Function Tests: No results for input(s): "AST", "ALT", "ALKPHOS", "BILITOT", "PROT", "ALBUMIN" in the last 168 hours. No results for input(s): "LIPASE", "AMYLASE" in the last 168 hours. No results for input(s): "AMMONIA" in the last 168 hours. Coagulation Profile: No results for input(s): "INR", "PROTIME" in the last 168 hours. Cardiac Enzymes: No results for input(s): "CKTOTAL", "CKMB", "CKMBINDEX", "TROPONINI" in the last 168  hours. BNP (last 3 results) No results for input(s): "PROBNP" in the last 8760 hours. HbA1C: No results for input(s): "HGBA1C" in the last 72 hours. CBG: Recent Labs  Lab 11/03/22 0332 11/03/22 0744 11/03/22 1131 11/03/22 1548 11/03/22 2107  GLUCAP 106* 84 98 138* 129*   Lipid Profile: No results for input(s): "CHOL", "HDL", "LDLCALC", "TRIG", "CHOLHDL", "LDLDIRECT" in the last 72 hours. Thyroid Function Tests: No results for input(s): "TSH", "T4TOTAL", "FREET4", "T3FREE", "THYROIDAB" in the last 72 hours. Anemia Panel: No results for input(s): "VITAMINB12", "FOLATE", "FERRITIN", "TIBC", "IRON", "RETICCTPCT" in the last 72 hours. Sepsis Labs: No results for input(s): "PROCALCITON", "LATICACIDVEN" in the last 168 hours.  No results found for this or any previous visit (from the past 240 hour(s)).       Radiology Studies: DG Knee Complete 4 Views Left  Result Date: 11/03/2022 CLINICAL DATA:  [id number] Knee pain, left [id number] x1.5 months without trauma EXAM: LEFT KNEE - COMPLETE 4+ VIEW COMPARISON:  08/18/2017 FINDINGS: No fracture or dislocation. No effusion. Mild tricompartmental marginal spurring and narrowing of articular cartilage. Alignment preserved. Normal mineralization. Regional soft tissues unremarkable. IMPRESSION: Mild tricompartmental degenerative changes. No acute findings. Electronically Signed   By: Corlis Leak M.D.   On: 11/03/2022 17:57        Scheduled Meds:  sodium chloride   Intravenous Once   sodium chloride   Intravenous Once   sodium chloride   Intravenous Once   atorvastatin  40 mg Oral Daily   buPROPion  150 mg Oral Daily   Chlorhexidine Gluconate Cloth  6 each Topical Daily   DULoxetine  60 mg Oral Daily   furosemide  40 mg Oral BID   guaiFENesin  1,200 mg Oral BID   insulin aspart  0-9 Units Subcutaneous TID AC & HS   insulin detemir  42 Units Subcutaneous BID   loratadine  10 mg Oral Daily   melatonin  5 mg Oral QHS   pantoprazole  40 mg Oral  BID   polyethylene glycol  17 g Oral Daily   pregabalin  150 mg Oral TID   sodium chloride flush  10-40 mL Intracatheter Q12H   tamsulosin  0.4 mg Oral QPC breakfast   Continuous Infusions:   LOS: 11 days    Time spent: 25 mins     Charise Killian, MD [facility] Pager [phone]  If 7PM-7AM, please contact night-coverage [email or web address] 11/04/2022, 8:47 AM

## 2022-11-04 NOTE — Progress Notes (Signed)
PT Cancellation Note  Patient Details Name: Russell [name] MRN: [id number] DOB: 05-28-1965   Cancelled Treatment:    Reason Eval/Treat Not Completed: Other (comment). Pt declining any mobility at this time referencing L knee pain, frustration over discharge planning and transportation, as well as just recently sitting EOB with nursing. PT provided active listening and problem solving as able, and will attempt mobility again as able.    Olga Coaster PT, DPT 3:12 PM,11/04/22

## 2022-11-05 DIAGNOSIS — D62 Acute posthemorrhagic anemia: Secondary | ICD-10-CM | POA: Diagnosis not present

## 2022-11-05 LAB — BASIC METABOLIC PANEL
Anion gap: 6 (ref 5–15)
BUN: 13 mg/dL (ref 6–20)
CO2: 30 mmol/L (ref 22–32)
Calcium: 7.7 mg/dL — ABNORMAL LOW (ref 8.9–10.3)
Chloride: 102 mmol/L (ref 98–111)
Creatinine, Ser: 0.78 mg/dL (ref 0.61–1.24)
GFR, Estimated: >60 mL/min (ref >60)
Glucose, Bld: 158 mg/dL — ABNORMAL HIGH (ref 70–99)
Potassium: 3.9 mmol/L (ref 3.5–5.1)
Sodium: 138 mmol/L (ref 135–145)

## 2022-11-05 LAB — CBC
HCT: 25.9 % — ABNORMAL LOW (ref 39.0–52.0)
Hemoglobin: 7.7 g/dL — ABNORMAL LOW (ref 13.0–17.0)
MCH: 24.8 pg — ABNORMAL LOW (ref 26.0–34.0)
MCHC: 29.7 g/dL — ABNORMAL LOW (ref 30.0–36.0)
MCV: 83.3 fL (ref 80.0–100.0)
Platelets: 180 10*3/uL (ref 150–400)
RBC: 3.11 MIL/uL — ABNORMAL LOW (ref 4.22–5.81)
RDW: 15.9 % — ABNORMAL HIGH (ref 11.5–15.5)
WBC: 4.9 10*3/uL (ref 4.0–10.5)
nRBC: 0.8 % — ABNORMAL HIGH (ref 0.0–0.2)

## 2022-11-05 LAB — GLUCOSE, CAPILLARY
Glucose-Capillary: 106 mg/dL — ABNORMAL HIGH (ref 70–99)
Glucose-Capillary: 107 mg/dL — ABNORMAL HIGH (ref 70–99)
Glucose-Capillary: 177 mg/dL — ABNORMAL HIGH (ref 70–99)
Glucose-Capillary: 88 mg/dL (ref 70–99)
Glucose-Capillary: 91 mg/dL (ref 70–99)
Glucose-Capillary: 94 mg/dL (ref 70–99)

## 2022-11-05 MED ORDER — HYDROCORTISONE ACETATE 25 MG RE SUPP
25.0000 mg | Freq: Two times a day (BID) | RECTAL | Status: DC
  Administered 2022-11-05 – 2022-11-06 (×3): 25 mg via RECTAL
  Filled 2022-11-05 (×4): qty 1

## 2022-11-05 MED ORDER — HYDROCORTISONE ACETATE 25 MG RE SUPP: 25.0000 mg | Freq: Two times a day (BID) | RECTAL | 0 refills | Status: DC

## 2022-11-05 MED ORDER — BUPROPION HCL ER (XL) 150 MG PO TB24: 150.0000 mg | ORAL_TABLET | Freq: Every day | ORAL | 0 refills | Status: AC

## 2022-11-05 MED ORDER — HYDROCORTISONE ACETATE 25 MG RE SUPP
25.0000 mg | Freq: Once | RECTAL | Status: AC
  Administered 2022-11-05: 25 mg via RECTAL
  Filled 2022-11-05: qty 1

## 2022-11-05 MED ORDER — OXYMETAZOLINE HCL 0.05 % NA SOLN
1.0000 | NASAL | Status: AC | PRN
  Administered 2022-11-06: 1 via NASAL
  Filled 2022-11-05 (×2): qty 15

## 2022-11-05 NOTE — Progress Notes (Signed)
Physician Discharge Summary  Russell [name] WGN:[id number] DOB: Jan 18, 1966 DOA: 10/23/2022  PCP: [name], MD  Admit date: 10/23/2022 Discharge date: 11/05/2022  Admitted From: home  Disposition:  home w/ home health   Recommendations for Outpatient Follow-up:  Follow up with PCP w/in 1 week   Home Health: yes Equipment/Devices:  Discharge Condition: stable  CODE STATUS: full  Diet recommendation:Carb Modified   Brief/Interim Summary: HPI was taken from Dr. Arville Care: Russell [name] is a 57 y.o. Caucasian male with medical history significant for combined systolic and diastolic CHF, right BKA, anxiety and depression, type 2 diabetes mellitus, hypertension, dyslipidemia and morbid obesity, who presented to the emergency room with acute onset of dyspnea.  The patient was just admitted from 8/13 till 8/24 acute blood loss anemia with associated upper GI bleeding and hypovolemic shock.  His EGD showed gastritis and nonbleeding gastric ulcers.  His colonoscopy showed no active bleeding.  He received a total of 4 units of PRBCs during his hospitalization as well as IV iron.  His hemoglobin was 8.3 on 8/18.  He required a brief IV Levophed for his hypovolemic shock.  He was placed on IV insulin drip for hyperosmolar hyperglycemic state.  He currently denies any nausea or vomiting or abdominal pain or heartburn.  He did not notice any melena or bright red bleeding per rectum.  No dysuria, oliguria or hematuria or flank pain.  No chest pain or palpitations.  No cough or wheezing or hemoptysis.  No other bleeding diathesis.  He is stated that this dyspnea started from the night he was discharged home.  He took 2 aspirin that night for headache.  He has not taken any more aspirin.  He denied taking Eliquis.     ED Course: When he came to the ER, BP was 103/41 with otherwise normal vital signs.  Labs revealed hemoglobin of 3.9 hematocrit 13 with MCH of 25.8.  High sensitive troponin was 6 and later  7.  BNP was 190.6.  CMP showed sodium 132 potassium 5, glucose 313 and BUN 51 with a creatinine 1.12.  Magnesium was 2.5 and albumin 2.5 with total protein of 5.3.  Beta-hydroxybutyrate was normal at 0.21. EKG as reviewed by me : EKG showed accelerated junctional rhythm with a rate of 72 with nonspecific interventricular conduction delay. Imaging: Portable chest x-ray showed stable cardiomegaly and vascular congestion and improved left lung base opacity.   The patient was typed and crossmatch and will be transfused 2 units of packed red blood cells.  He was given 4 mg of IV morphine sulfate, 80 mg of IV Protonix bolus followed by IV drip.  He will be admitted to an observation progressive unit bed for further evaluation and management.  Discharge Diagnoses:  Principal Problem:   Acute blood loss anemia Active Problems:   Acute urinary retention   Dyslipidemia   Type 2 diabetes mellitus with peripheral neuropathy (HCC)   Anxiety and depression   Essential hypertension   Acute gastric ulcer with hemorrhage   Major depressive disorder, recurrent episode, moderate (HCC)  Acute blood loss anemia: secondary to recurrent upper GI bleed from NSAID induced peptic ulcer disease. S/p EGD w/ healed gastric ulcers.  Nonbleeding superficial duodenal ulcer. S/p 7 units of pRBCs transfused. Holding plavix, aspirin. H&H are labile   Urinary retention: unable to void for voiding trial so foley was placed again 11/04/22. Will be d/c w/ foley and f/u outpatient w/ urology    Hemorrhoids: anusol supp ordered  Hyponatremia: resolved    HLD: continue on statin    DM2: likely poorly controlled. Continue on levemir, SSI w/ accuchecks    PAD: w/ hx of right BKA. Continue w/ supportive care   Peripheral neuropathy: continue on home dose of pregabalin    Depression: severity unknown. Continue on home dose of duloxetine & continue on wellbutrin. Holding nortriptyline    Morbid obesity: BMI 52.2. Complicates  overall care & prognosis    Generalized weakness: PT/OT recs SNF but now refuses SNF.   Pt is medically stable but refusing to go to SNF w/o his electric wheelchair as well as his cat. Pt stated "no one is talking to me and nothing has been done for me." Discussed this charge nurse, CM, & DOC. Pt requested another physician.     Discharge Instructions  Discharge Instructions     Diet Carb Modified   Complete by: As directed    Discharge instructions   Complete by: As directed    F/u w/ PCP w/in 1 week   Increase activity slowly   Complete by: As directed    No wound care   Complete by: As directed       Allergies as of 11/05/2022       Reactions   Fluoxetine    Other reaction(s): Hallucination   Shellfish Allergy Hives   Sulfa Antibiotics Hives   Vancomycin    Other reaction(s): Red Man Syndrome   Metformin Nausea Only        Medication List     TAKE these medications    atorvastatin 40 MG tablet Commonly known as: LIPITOR Take 40 mg by mouth daily.   buPROPion 150 MG 24 hr tablet Commonly known as: WELLBUTRIN XL Take 1 tablet (150 mg total) by mouth daily. Start taking on: November 06, 2022   cetirizine 10 MG tablet Commonly known as: ZYRTEC Take 10 mg by mouth daily as needed for allergies.   collagenase 250 UNIT/GM ointment Commonly known as: SANTYL Apply topically daily.   DULoxetine HCl 30 MG Csdr Take 30 mg by mouth in the morning, at noon, and at bedtime.   furosemide 40 MG tablet Commonly known as: LASIX Take 1 tablet (40 mg total) by mouth 2 (two) times daily.   HumuLIN R 100 UNIT/ML injection Generic drug: insulin regular Inject 0.15 mLs (15 Units total) into the skin 3 (three) times daily before meals.   hydrocortisone 25 MG suppository Commonly known as: ANUSOL-HC Place 1 suppository (25 mg total) rectally 2 (two) times daily for 14 days.   insulin detemir 100 UNIT/ML FlexPen Commonly known as: LEVEMIR Inject 42 Units into the skin  2 (two) times daily.   Insulin Pen Needle 33G X 5 MM Misc 1 Dose by Does not apply route 2 (two) times daily.   oxyCODONE-acetaminophen 5-325 MG tablet Commonly known as: PERCOCET/ROXICET Take 1 tablet by mouth every 6 (six) hours as needed for severe pain.   pantoprazole 40 MG tablet Commonly known as: PROTONIX Take 1 tablet (40 mg total) by mouth 2 (two) times daily.   potassium chloride SA 20 MEQ tablet Commonly known as: KLOR-CON M Take 20 mEq by mouth 2 (two) times daily as needed (cramps).   pregabalin 150 MG capsule Commonly known as: LYRICA Take 150 mg by mouth 3 (three) times daily.   senna-docusate 8.6-50 MG tablet Commonly known as: Senokot-S Take 2 tablets by mouth daily as needed for mild constipation.   tamsulosin 0.4 MG Caps capsule Commonly  known as: FLOMAX Take 1 capsule (0.4 mg total) by mouth daily after breakfast.        Allergies  Allergen Reactions   Fluoxetine     Other reaction(s): Hallucination   Shellfish Allergy Hives   Sulfa Antibiotics Hives   Vancomycin     Other reaction(s): Red Man Syndrome   Metformin Nausea Only    Consultations: GI  Psych    Procedures/Studies: DG Knee Complete 4 Views Left  Result Date: 11/03/2022 CLINICAL DATA:  [id number] Knee pain, left [id number] x1.5 months without trauma EXAM: LEFT KNEE - COMPLETE 4+ VIEW COMPARISON:  08/18/2017 FINDINGS: No fracture or dislocation. No effusion. Mild tricompartmental marginal spurring and narrowing of articular cartilage. Alignment preserved. Normal mineralization. Regional soft tissues unremarkable. IMPRESSION: Mild tricompartmental degenerative changes. No acute findings. Electronically Signed   By: Corlis Leak M.D.   On: 11/03/2022 17:57   CT ABDOMEN PELVIS W CONTRAST  Result Date: 10/24/2022 CLINICAL DATA:  Blood loss. EXAM: CT ABDOMEN AND PELVIS WITH CONTRAST TECHNIQUE: Multidetector CT imaging of the abdomen and pelvis was performed using the standard protocol following  bolus administration of intravenous contrast. RADIATION DOSE REDUCTION: This exam was performed according to the departmental dose-optimization program which includes automated exposure control, adjustment of the mA and/or kV according to patient size and/or use of iterative reconstruction technique. CONTRAST:  OMNIPAQUE IOHEXOL 300 MG/ML  SOLN COMPARISON:  CT 10/12/2022. FINDINGS: Lower chest: There is some linear opacity lung bases likely scar or atelectasis. No pleural effusion. Coronary artery calcifications are seen. There are some prominent nodes identified along the mediastinum at the edge of the imaging field, unchanged from previous. Stable nodular area of thickening along the subcutaneous fat along the anterior aspect of the lower chest wall right of midline on series 2, image 20 Hepatobiliary: No focal liver abnormality is seen. No gallstones, gallbladder wall thickening, or biliary dilatation. Patent portal vein. Pancreas: Severe atrophy of the pancreas. Spleen: Normal in size without focal abnormality. Adrenals/Urinary Tract: Adrenal glands are preserved. No enhancing renal mass or collecting system dilatation. The ureters have normal course and caliber down to the bladder. The right kidney is malrotated, a congenital variant distended urinary bladder Stomach/Bowel: No oral contrast. The large bowel has a normal course and caliber with some scattered stool. Appendix is not seen. There is some surgical changes along the posterior aspect of the cecum. The stomach and small bowel are nondilated. Vascular/Lymphatic: Normal caliber aorta and IVC with mild vascular calcifications. There are some small retroperitoneal nodes identified, nonpathologic by size criteria. Similar nodes along the pelvic sidewall regions. These are unchanged from previous. Reproductive: Prostate is unremarkable. Other: No retroperitoneal hematoma. No ascites or fluid collection. There is anasarca. Additional skin thickening  with stranding along the anterior pelvic wall the level of the perineum, increasing from the prior examination. Please correlate with clinical findings such as cellulitis. Small fat containing umbilical hernia. No soft tissue gas seen. Musculoskeletal: Moderate degenerative changes along the spine with some disc bulging and osteophytes. Degenerative changes along the pelvis. IMPRESSION: No retroperitoneal hematoma identified.  No ascites or free air. No bowel obstruction. Stable subcutaneous area of thickening and nodularity along the anterior abdominal, lower chest wall right of midline. Increasing skin thickening with some stranding along the anterior low pelvis, perineal region. Please correlate with clinical findings. Electronically Signed   By: Karen Kays M.D.   On: 10/24/2022 14:54   DG Chest Portable 1 View  Result Date: 10/23/2022 CLINICAL DATA:  Shortness of breath and chest pain.  History of CHF. EXAM: PORTABLE CHEST 1 VIEW COMPARISON:  Most recent radiograph 10/12/2022 FINDINGS: Cardiomegaly is stable. Vascular congestion is unchanged. There is improving left lung base opacity. No new airspace disease. No large pleural effusion on this AP view. No pneumothorax. IMPRESSION: 1. Stable cardiomegaly and vascular congestion. 2. Improving left lung base opacity. Electronically Signed   By: Narda Rutherford M.D.   On: 10/23/2022 23:17   DG Chest Port 1 View  Result Date: 10/15/2022 CLINICAL DATA:  Hypoxia. EXAM: PORTABLE CHEST 1 VIEW COMPARISON:  One-view chest x-ray 10/12/2022 FINDINGS: The heart is mildly enlarged, exaggerated by low lung volumes. Moderate pulmonary vascular congestion is present. Mild left basilar airspace opacities are noted. The lungs are otherwise clear. IMPRESSION: 1. Cardiomegaly and moderate pulmonary vascular congestion. 2. Mild left basilar airspace disease likely reflects atelectasis. Infection is not excluded. Electronically Signed   By: Marin Roberts M.D.   On:  10/15/2022 11:30   NM GI Blood Loss  Result Date: 10/13/2022 CLINICAL DATA:  Upper GI bleed. EXAM: NUCLEAR MEDICINE GASTROINTESTINAL BLEEDING SCAN TECHNIQUE: Sequential abdominal images were obtained following intravenous administration of Tc-47m labeled red blood cells. RADIOPHARMACEUTICALS:  19.75 mCi Tc-34m pertechnetate in-vitro labeled red cells. COMPARISON:  CT abdomen pelvis dated 10/13/2022. FINDINGS: Radiotracer noted in the cardiac blood pool and aortoiliac vessels. Physiologic uptake in the spleen and liver. No activity noted conforming to the bowel to suggest active GI bleed. IMPRESSION: No scintigraphic evidence of active GI bleed. Electronically Signed   By: Elgie Collard M.D.   On: 10/13/2022 19:51   CT ABDOMEN PELVIS W CONTRAST  Addendum Date: 10/13/2022   ADDENDUM REPORT: 10/13/2022 00:58 ADDENDUM: There is an area of focal subcutaneous induration identified in the right anterior abdominal wall best seen on image number 30 of series 2. When compared with multiple previous exams dating back to 2020 this area has been present and likely represents an area of subcutaneous scarring or fat necrosis. Electronically Signed   By: Alcide Clever M.D.   On: 10/13/2022 00:58   Result Date: 10/13/2022 CLINICAL DATA:  Abdominal pain EXAM: CT ABDOMEN AND PELVIS WITH CONTRAST TECHNIQUE: Multidetector CT imaging of the abdomen and pelvis was performed using the standard protocol following bolus administration of intravenous contrast. RADIATION DOSE REDUCTION: This exam was performed according to the departmental dose-optimization program which includes automated exposure control, adjustment of the mA and/or kV according to patient size and/or use of iterative reconstruction technique. CONTRAST:  OMNIPAQUE IOHEXOL 350 MG/ML SOLN COMPARISON:  09/29/2021 FINDINGS: Lower chest: No acute abnormality. Hepatobiliary: No focal liver abnormality is seen. No gallstones, gallbladder wall thickening, or  biliary dilatation. Pancreas: Fatty replacement of the pancreas is noted. Spleen: Normal in size without focal abnormality. Adrenals/Urinary Tract: Adrenal glands are within normal limits. Kidneys demonstrate a normal enhancement pattern bilaterally. No renal calculi or obstructive changes are seen. The bladder is partially distended. Stomach/Bowel: No obstructive or inflammatory changes of colon are noted. The appendix has been surgically removed. Small bowel and stomach are unremarkable. Vascular/Lymphatic: No significant vascular findings are present. No enlarged abdominal or pelvic lymph nodes. Reproductive: Prostate is unremarkable. Other: No ascites is noted. Small fat containing umbilical hernia is noted. Musculoskeletal: Degenerative changes of lumbar spine are noted. IMPRESSION: No acute abnormality noted. Electronically Signed: By: Alcide Clever M.D. On: 10/13/2022 00:32   DG Chest Portable 1 View  Result Date: 10/12/2022 CLINICAL DATA:  Epigastric abdominal pain. Pale with bloody bowel  movements. EXAM: PORTABLE CHEST 1 VIEW COMPARISON:  None 05/18/2020 FINDINGS: Cardiac enlargement. No vascular congestion, edema, or consolidation. No pleural effusions. No pneumothorax. Mediastinal contours appear intact. IMPRESSION: Cardiac enlargement.  Lungs are clear. Electronically Signed   By: Burman Nieves M.D.   On: 10/12/2022 22:35   (Echo, Carotid, EGD, Colonoscopy, ERCP)    Subjective: Pt c/o hemorrhoids    Discharge Exam: Vitals:   11/05/22 0754 11/05/22 1240  BP: 119/60 (!) 100/42  Pulse: 77 76  Resp: 20 18  Temp: 98 F (36.7 C) 98.2 F (36.8 C)  SpO2: 97% 92%   Vitals:   11/05/22 0035 11/05/22 0453 11/05/22 0754 11/05/22 1240  BP: 126/64 132/64 119/60 (!) 100/42  Pulse: 76 80 77 76  Resp:   20 18  Temp: 98.1 F (36.7 C) 98.2 F (36.8 C) 98 F (36.7 C) 98.2 F (36.8 C)  TempSrc: Oral Oral    SpO2: 94% 92% 97% 92%  Weight:      Height:        General: Pt is alert, awake,  not in acute distress. Morbidly obese Cardiovascular: S1/S2 +, no rubs, no gallops Respiratory: decreased breath sounds b/l Abdominal: Soft, NT,obese, bowel sounds + Extremities:  no cyanosis    The results of significant diagnostics from this hospitalization (including imaging, microbiology, ancillary and laboratory) are listed below for reference.     Microbiology: No results found for this or any previous visit (from the past 240 hour(s)).   Labs: BNP (last 3 results) Recent Labs    10/12/22 2216 10/23/22 2242  BNP 47.0 190.6*   Basic Metabolic Panel: Recent Labs  Lab 10/30/22 0632 11/01/22 0955 11/03/22 0544 11/04/22 0529 11/05/22 0527  NA 135 137 139 136 138  K 3.7 4.2 3.9 4.0 3.9  CL 101 103 102 101 102  CO2 27 26 29 28 30   GLUCOSE 178* 156* 81 141* 158*  BUN 15 15 15 15 13   CREATININE 0.93 0.83 0.67 0.87 0.78  CALCIUM 7.8* 7.8* 7.9* 7.4* 7.7*   Liver Function Tests: No results for input(s): "AST", "ALT", "ALKPHOS", "BILITOT", "PROT", "ALBUMIN" in the last 168 hours. No results for input(s): "LIPASE", "AMYLASE" in the last 168 hours. No results for input(s): "AMMONIA" in the last 168 hours. CBC: Recent Labs  Lab 10/30/22 0632 10/31/22 0559 11/01/22 0955 11/03/22 0544 11/04/22 0529 11/05/22 0527  WBC 3.5* 4.0 4.6  --  5.5 4.9  HGB 6.8* 7.3* 7.4* 7.8* 7.2* 7.7*  HCT 22.4* 23.5* 23.8* 25.8* 23.8* 25.9*  MCV 84.2 82.2 84.1  --  82.4 83.3  PLT 191 193 179  --  172 180   Cardiac Enzymes: No results for input(s): "CKTOTAL", "CKMB", "CKMBINDEX", "TROPONINI" in the last 168 hours. BNP: Invalid input(s): "POCBNP" CBG: Recent Labs  Lab 11/04/22 1147 11/04/22 1710 11/04/22 2359 11/05/22 0904 11/05/22 1241  GLUCAP 110* 106* 177* 106* 91   D-Dimer No results for input(s): "DDIMER" in the last 72 hours. Hgb A1c No results for input(s): "HGBA1C" in the last 72 hours. Lipid Profile No results for input(s): "CHOL", "HDL", "LDLCALC", "TRIG", "CHOLHDL",  "LDLDIRECT" in the last 72 hours. Thyroid function studies No results for input(s): "TSH", "T4TOTAL", "T3FREE", "THYROIDAB" in the last 72 hours.  Invalid input(s): "FREET3" Anemia work up No results for input(s): "VITAMINB12", "FOLATE", "FERRITIN", "TIBC", "IRON", "RETICCTPCT" in the last 72 hours. Urinalysis    Component Value Date/Time   COLORURINE YELLOW (A) 11/20/2016 1307   APPEARANCEUR CLEAR (A) 11/20/2016 1307  APPEARANCEUR Clear 08/13/2013 1624   LABSPEC 1.022 11/20/2016 1307   LABSPEC 1.012 08/13/2013 1624   PHURINE 6.0 11/20/2016 1307   GLUCOSEU >=500 (A) 11/20/2016 1307   GLUCOSEU 150 mg/dL 29/52/8413 2440   HGBUR SMALL (A) 11/20/2016 1307   BILIRUBINUR NEGATIVE 11/20/2016 1307   BILIRUBINUR Negative 08/13/2013 1624   KETONESUR 5 (A) 11/20/2016 1307   PROTEINUR NEGATIVE 11/20/2016 1307   NITRITE NEGATIVE 11/20/2016 1307   LEUKOCYTESUR NEGATIVE 11/20/2016 1307   LEUKOCYTESUR Negative 08/13/2013 1624   Sepsis Labs Recent Labs  Lab 10/31/22 0559 11/01/22 0955 11/04/22 0529 11/05/22 0527  WBC 4.0 4.6 5.5 4.9   Microbiology No results found for this or any previous visit (from the past 240 hour(s)).   Time coordinating discharge: Over 30 minutes  SIGNED:   Charise Killian, MD  [facility] 11/05/2022, 3:07 PM Pager   If 7PM-7AM, please contact night-coverage [email or web address]

## 2022-11-05 NOTE — Care Management Important Message (Signed)
Important Message  Patient Details  Name: Russell [name] MRN: [id number] Date of Birth: [birth year]   Medicare Important Message Given:  Yes     [name] 11/05/2022, 9:56 AM

## 2022-11-05 NOTE — Plan of Care (Signed)
  Problem: Cardiac: Goal: Ability to maintain an adequate cardiac output will improve Outcome: Progressing   Problem: Fluid Volume: Goal: Ability to achieve a balanced intake and output will improve Outcome: Progressing   Problem: Metabolic: Goal: Ability to maintain appropriate glucose levels will improve Outcome: Progressing   Problem: Nutritional: Goal: Maintenance of adequate nutrition will improve Outcome: Progressing

## 2022-11-05 NOTE — Progress Notes (Signed)
PROGRESS NOTE    Russell [name]  WUJ:[id number] DOB: February 09, 1966 DOA: 10/23/2022 PCP: [name], MD    Assessment & Plan:   Principal Problem:   Acute blood loss anemia Active Problems:   Acute urinary retention   Dyslipidemia   Type 2 diabetes mellitus with peripheral neuropathy (HCC)   Anxiety and depression   Essential hypertension   Acute gastric ulcer with hemorrhage   Major depressive disorder, recurrent episode, moderate (HCC)  Assessment and Plan:  Acute blood loss anemia: secondary to recurrent upper GI bleed from NSAID induced peptic ulcer disease. S/p EGD w/ healed gastric ulcers.  Nonbleeding superficial duodenal ulcer. S/p 7 units of pRBCs transfused. Holding plavix, aspirin. H&H are labile  Urinary retention: unable to void for voiding trial so foley was placed again 11/04/22. Will be d/c w/ foley and f/u outpatient w/ urology   Hemorrhoids: anusol supp ordered  Hyponatremia: resolved    HLD: continue on statin   DM2: likely poorly controlled. Continue on levemir, SSI w/ accuchecks    PAD: w/ hx of right BKA. Continue w/ supportive care   Peripheral neuropathy: continue on home dose of pregabalin   Depression: severity unknown. Continue on home dose of duloxetine & continue on wellbutrin. Holding nortriptyline    Morbid obesity: BMI 52.2. Complicates overall care & prognosis    Generalized weakness: PT/OT recs SNF      DVT prophylaxis:  SCDs Code Status: full  Family Communication:  Disposition Plan: d/c to SNF.   Level of care: Progressive Status is: Inpatient Remains inpatient appropriate because: medically stable but refusing to go to SNF w/o his electric wheelchair as well as his cat. Pt stated "no one is talking to me and nothing has been done for me." Discussed this charge nurse, CM, & DOC. Pt requested another physician.     Consultants:  GI   Procedures:   Antimicrobials:   Subjective: Pt c/o hemorrhoids    Objective: Vitals:   11/04/22 2008 11/05/22 0035 11/05/22 0453 11/05/22 0754  BP: 137/68 126/64 132/64 119/60  Pulse: 81 76 80 77  Resp: 18   20  Temp: 98.2 F (36.8 C) 98.1 F (36.7 C) 98.2 F (36.8 C) 98 F (36.7 C)  TempSrc: Oral Oral Oral   SpO2: (!) 89% 94% 92% 97%  Weight:      Height:        Intake/Output Summary (Last 24 hours) at 11/05/2022 0905 Last data filed at 11/05/2022 0754 Gross per 24 hour  Intake 480 ml  Output 2100 ml  Net -1620 ml   Filed Weights   10/23/22 2209 11/03/22 0329  Weight: (!) 170 kg (!) 172.8 kg    Examination:  General exam: appears agitated & frustrated Respiratory system: diminished breath sounds Cardiovascular system: S1 & S2+. No rubs or clicks  Gastrointestinal system: abd is soft, NT, obese & normal bowel sounds Central nervous system: Alert & awake  Psychiatry: judgement and insight appears poor. Agitated mood and affect     Data Reviewed: I have personally reviewed following labs and imaging studies  CBC: Recent Labs  Lab 10/30/22 0632 10/31/22 0559 11/01/22 0955 11/03/22 0544 11/04/22 0529 11/05/22 0527  WBC 3.5* 4.0 4.6  --  5.5 4.9  HGB 6.8* 7.3* 7.4* 7.8* 7.2* 7.7*  HCT 22.4* 23.5* 23.8* 25.8* 23.8* 25.9*  MCV 84.2 82.2 84.1  --  82.4 83.3  PLT 191 193 179  --  172 180   Basic Metabolic Panel: Recent Labs  Lab 10/30/22 0632 11/01/22 0955 11/03/22 0544 11/04/22 0529 11/05/22 0527  NA 135 137 139 136 138  K 3.7 4.2 3.9 4.0 3.9  CL 101 103 102 101 102  CO2 27 26 29 28 30   GLUCOSE 178* 156* 81 141* 158*  BUN 15 15 15 15 13   CREATININE 0.93 0.83 0.67 0.87 0.78  CALCIUM 7.8* 7.8* 7.9* 7.4* 7.7*   GFR: Estimated Creatinine Clearance: 162.7 mL/min (by C-G formula based on SCr of 0.78 mg/dL). Liver Function Tests: No results for input(s): "AST", "ALT", "ALKPHOS", "BILITOT", "PROT", "ALBUMIN" in the last 168 hours. No results for input(s): "LIPASE", "AMYLASE" in the last 168 hours. No results for  input(s): "AMMONIA" in the last 168 hours. Coagulation Profile: No results for input(s): "INR", "PROTIME" in the last 168 hours. Cardiac Enzymes: No results for input(s): "CKTOTAL", "CKMB", "CKMBINDEX", "TROPONINI" in the last 168 hours. BNP (last 3 results) No results for input(s): "PROBNP" in the last 8760 hours. HbA1C: No results for input(s): "HGBA1C" in the last 72 hours. CBG: Recent Labs  Lab 11/03/22 1548 11/03/22 2107 11/04/22 1147 11/04/22 1710 11/04/22 2359  GLUCAP 138* 129* 110* 106* 177*   Lipid Profile: No results for input(s): "CHOL", "HDL", "LDLCALC", "TRIG", "CHOLHDL", "LDLDIRECT" in the last 72 hours. Thyroid Function Tests: No results for input(s): "TSH", "T4TOTAL", "FREET4", "T3FREE", "THYROIDAB" in the last 72 hours. Anemia Panel: No results for input(s): "VITAMINB12", "FOLATE", "FERRITIN", "TIBC", "IRON", "RETICCTPCT" in the last 72 hours. Sepsis Labs: No results for input(s): "PROCALCITON", "LATICACIDVEN" in the last 168 hours.  No results found for this or any previous visit (from the past 240 hour(s)).       Radiology Studies: DG Knee Complete 4 Views Left  Result Date: 11/03/2022 CLINICAL DATA:  [id number] Knee pain, left [id number] x1.5 months without trauma EXAM: LEFT KNEE - COMPLETE 4+ VIEW COMPARISON:  08/18/2017 FINDINGS: No fracture or dislocation. No effusion. Mild tricompartmental marginal spurring and narrowing of articular cartilage. Alignment preserved. Normal mineralization. Regional soft tissues unremarkable. IMPRESSION: Mild tricompartmental degenerative changes. No acute findings. Electronically Signed   By: Corlis Leak M.D.   On: 11/03/2022 17:57        Scheduled Meds:  sodium chloride   Intravenous Once   sodium chloride   Intravenous Once   sodium chloride   Intravenous Once   atorvastatin  40 mg Oral Daily   buPROPion  150 mg Oral Daily   Chlorhexidine Gluconate Cloth  6 each Topical Daily   DULoxetine  60 mg Oral Daily   furosemide   40 mg Oral BID   guaiFENesin  1,200 mg Oral BID   hydrocortisone  25 mg Rectal BID   insulin aspart  0-9 Units Subcutaneous TID AC & HS   insulin detemir  42 Units Subcutaneous BID   loratadine  10 mg Oral Daily   melatonin  5 mg Oral QHS   pantoprazole  40 mg Oral BID   polyethylene glycol  17 g Oral Daily   pregabalin  150 mg Oral TID   sodium chloride flush  10-40 mL Intracatheter Q12H   tamsulosin  0.4 mg Oral QPC breakfast   Continuous Infusions:   LOS: 12 days       Charise Killian, MD [facility] Pager [phone]  If 7PM-7AM, please contact night-coverage [email or web address] 11/05/2022, 9:05 AM

## 2022-11-05 NOTE — TOC Progression Note (Addendum)
Transition of Care [facility]) - Progression Note    Patient Details  Name: Russell [name] MRN: [id number] Date of Birth: [birth year]  Transition of Care [facility]) CM/SW Contact  Truddie Hidden, RN Phone Number: 11/05/2022, 12:01 PM  Clinical Narrative:    Spoke with patient at bedside regarding discharge plan and concerns of going to [facility] per his original request. RNCM reiterated his request for a SNF in [place]. Patient stated his frustrations about not being able to have his electric wheelchair. He was advised RNCM confirmed he would be permitted to have his chair pending he arranges for it to get there and can prove with the physical therapist he can safely operate after with therapy after he admits. Patient advised RNCM could contact other local facilities. Patient has other local bed offers. He is not agreeable to any SNF. Patient stated he is not permitted to bring his cat and is not willing to leave it. He has no family that is able to help with his cat. He stated he will return home and do the best he can do. He is agreeable to [facility].   2:00pm Spoke with patient at bedside. RNCM inquired with patient about who is able to assist him at home and if he is still interested in going to SNF. Patient stated he wasn't going to SNF, "I have too much to do." He requested HH via [facility]. He was advised [facility] does not provide physical therapy. He was asked about another [facility] agency preference. "Mame I don't care." Patient was advised the accepting [name] agency will contact him to scheduled SOC. He is able to let himself in his home. He stated his keys are here with him. When asked about transportation home patient told RNCM "Don't worry it about mame."   2:20pm HH referral sent and accepted by Kandee Keen from [place] for HHPT/OT/ SW/ HHA  2:45pm Spoke with patient o advised him HH had been arranged with [facility]. He was informed a representative would reach out to him to schedule and appointment for [facility]. RNCM  discussed transportation arrangements. Patient states he is not ready to discharge today. RNCM discussed arrangements for tomorrow morning. Patient stated there is something in his buttocks and he wanted it addressed before he leaves. Patient stated he wont leave until he is well. Patient also stated he had the right to appeal "That lady said I could call that number on that paper if I didn't agree with being discharged." RNCM reiterated patient of his right to appeal.   MD notified.  3:50pm EMS was scheduled then cancelled. Per nurse patient is appealing discharge.    Expected Discharge Plan: Home/Self Care Barriers to Discharge: Continued Medical Work up  Expected Discharge Plan and Services       Living arrangements for the past 2 months: Single Family Home                                       Social Determinants of Health (SDOH) Interventions SDOH Screenings   Food Insecurity: No Food Insecurity (10/24/2022)  Housing: Low Risk  (10/24/2022)  Transportation Needs: No Transportation Needs (10/24/2022)  Utilities: Not At Risk (10/24/2022)  Financial Resource Strain: Medium Risk (06/17/2022)   Received from [facility]  Tobacco Use: Low Risk  (10/26/2022)    Readmission Risk Interventions    10/25/2022    3:55 PM  Readmission Risk Prevention Plan  Transportation Screening Complete  PCP or Specialist Appt within 3-5 Days Complete  HRI or Home Care Consult Complete  Social Work Consult for Recovery Care Planning/Counseling Complete  Palliative Care Screening Not Applicable  Medication Review [employer]) Complete

## 2022-11-05 NOTE — Progress Notes (Signed)
OT Cancellation Note  Patient Details Name: Russell [name] MRN: [id number] DOB: 02-09-1966   Cancelled Treatment:    Reason Eval/Treat Not Completed: Other (comment). Pt received on BSC for BM. Pt endorsing difficulty 2/2 hemorrhoids. Pt request for suppository relayed to RN via secure chat. Pt requesting additional time for toileting. Will re-attempt at later date/time as pt is available.   Arman Filter., MPH, MS, OTR/L ascom [phone] 11/05/22, 11:11 AM

## 2022-11-05 NOTE — Progress Notes (Signed)
PT Cancellation Note  Patient Details Name: Russell [name] MRN: [id number] DOB: 09-Feb-1966   Cancelled Treatment:     PT attempted to work with pt 3 x this afternoon. Pt remains unwilling to participate. Chartered loss adjuster discussed that he has DC orders and discussed what his plans are for going home and managing since he is currently refusing SNF. " Im not doing anything or going anywhere until they fix my hemorrhoids and stomach pain." Acute PT will continue to follow and progress as able per pt tolerance/willingness to participate.    [name] 11/05/2022, 3:37 PM

## 2022-11-05 NOTE — Progress Notes (Deleted)
void

## 2022-11-06 DIAGNOSIS — R338 Other retention of urine: Secondary | ICD-10-CM

## 2022-11-06 DIAGNOSIS — D62 Acute posthemorrhagic anemia: Secondary | ICD-10-CM | POA: Diagnosis not present

## 2022-11-06 DIAGNOSIS — K644 Residual hemorrhoidal skin tags: Secondary | ICD-10-CM

## 2022-11-06 DIAGNOSIS — K2900 Acute gastritis without bleeding: Secondary | ICD-10-CM | POA: Diagnosis not present

## 2022-11-06 DIAGNOSIS — E1142 Type 2 diabetes mellitus with diabetic polyneuropathy: Secondary | ICD-10-CM | POA: Diagnosis not present

## 2022-11-06 LAB — CBC
HCT: 24.2 % — ABNORMAL LOW (ref 39.0–52.0)
Hemoglobin: 7 g/dL — ABNORMAL LOW (ref 13.0–17.0)
MCH: 24.1 pg — ABNORMAL LOW (ref 26.0–34.0)
MCHC: 28.9 g/dL — ABNORMAL LOW (ref 30.0–36.0)
MCV: 83.4 fL (ref 80.0–100.0)
Platelets: 165 10*3/uL (ref 150–400)
RBC: 2.9 MIL/uL — ABNORMAL LOW (ref 4.22–5.81)
RDW: 16 % — ABNORMAL HIGH (ref 11.5–15.5)
WBC: 4.6 10*3/uL (ref 4.0–10.5)
nRBC: 0.4 % — ABNORMAL HIGH (ref 0.0–0.2)

## 2022-11-06 LAB — GLUCOSE, CAPILLARY
Glucose-Capillary: 126 mg/dL — ABNORMAL HIGH (ref 70–99)
Glucose-Capillary: 95 mg/dL (ref 70–99)
Glucose-Capillary: 129 mg/dL — ABNORMAL HIGH (ref 70–99)
Glucose-Capillary: 184 mg/dL — ABNORMAL HIGH (ref 70–99)
Glucose-Capillary: 169 mg/dL — ABNORMAL HIGH (ref 70–99)

## 2022-11-06 LAB — BASIC METABOLIC PANEL
Anion gap: 6 (ref 5–15)
BUN: 9 mg/dL (ref 6–20)
CO2: 32 mmol/L (ref 22–32)
Calcium: 7.8 mg/dL — ABNORMAL LOW (ref 8.9–10.3)
Chloride: 100 mmol/L (ref 98–111)
Creatinine, Ser: 0.73 mg/dL (ref 0.61–1.24)
GFR, Estimated: >60 mL/min (ref >60)
Glucose, Bld: 122 mg/dL — ABNORMAL HIGH (ref 70–99)
Potassium: 3.5 mmol/L (ref 3.5–5.1)
Sodium: 138 mmol/L (ref 135–145)

## 2022-11-06 MED ORDER — INSULIN DETEMIR 100 UNIT/ML ~~LOC~~ SOLN
25.0000 [IU] | Freq: Two times a day (BID) | SUBCUTANEOUS | Status: DC
  Administered 2022-11-06 – 2022-11-08 (×4): 25 [IU] via SUBCUTANEOUS
  Filled 2022-11-06 (×6): qty 0.25

## 2022-11-06 MED ORDER — HYDROMORPHONE HCL 1 MG/ML IJ SOLN
1.0000 mg | Freq: Four times a day (QID) | INTRAMUSCULAR | Status: DC | PRN
  Administered 2022-11-06 – 2022-11-07 (×3): 1 mg via INTRAVENOUS
  Filled 2022-11-06 (×3): qty 1

## 2022-11-06 MED ORDER — HYDROCORTISONE (PERIANAL) 2.5 % EX CREA: TOPICAL_CREAM | Freq: Four times a day (QID) | CUTANEOUS | 0 refills | Status: DC

## 2022-11-06 MED ORDER — HYDROCORTISONE (PERIANAL) 2.5 % EX CREA
TOPICAL_CREAM | Freq: Four times a day (QID) | CUTANEOUS | Status: DC
  Administered 2022-11-08: 1 via RECTAL
  Filled 2022-11-06: qty 28.35

## 2022-11-06 NOTE — Progress Notes (Signed)
[facility]  - [place] [facility]   PATIENT NAME: Russell [name]    MR#:  [id number]  DATE OF BIRTH:  [birth year]  SUBJECTIVE:  Pt picked up from Dr Mayford Knife  Sitting at the side of the bed and eating lunch. Tolerating PO diet very well. Complains of skin tags/hemorrhoids. Wants to get surgery done. No vomiting blood or rectal bleed.  Patient has appealed his discharge and waiting for insurance decision    VITALS:  Blood pressure 123/65, pulse 80, temperature 98.5 F (36.9 C), temperature source Oral, resp. rate 19, height 5\' 11"  (1.803 m), weight (!) 172.8 kg, SpO2 96%.  PHYSICAL EXAMINATION:   GENERAL:  57 y.o.-year-old patient with no acute distress. Morbidly obese LUNGS: decreased breath sounds bilaterally, no wheezing CARDIOVASCULAR: S1, S2 normal. No murmur   ABDOMEN: Soft, nontender, nondistended. Abdominal obesity  patient has external skin tags/hemorrhoid non-thrombosis there is one which above which is about 2 x 3 cm nontender. EXTREMITIES: BKA stump stable, chronic venous stasis changes present. NEUROLOGIC: nonfocal  patient is alert and awake   LABORATORY PANEL:  CBC Recent Labs  Lab 11/06/22 0501  WBC 4.6  HGB 7.0*  HCT 24.2*  PLT 165    Chemistries  Recent Labs  Lab 11/06/22 0501  NA 138  K 3.5  CL 100  CO2 32  GLUCOSE 122*  BUN 9  CREATININE 0.73  CALCIUM 7.8*    Assessment and Plan Russell [name] is a 57 y.o. Caucasian male with medical history significant for combined systolic and diastolic CHF, right BKA, anxiety and depression, type 2 diabetes mellitus, hypertension, dyslipidemia and morbid obesity, who presented to the emergency room with acute onset of dyspnea.  The patient was just admitted from 8/13 till 8/24 acute blood loss anemia with associated upper GI bleeding and hypovolemic shock.  His EGD showed gastritis and nonbleeding gastric ulcers.  His colonoscopy showed no active bleeding.  He received a total of 4 units  of PRBCs during his hospitalization as well as IV iron.  His hemoglobin was 8.3 on 8/18.  He required a brief IV Levophed for his hypovolemic shock.  He took 2 aspirin that night for headache. He has not taken any more aspirin.   Hgb was 3.9--- 7 unit BT--7.7-7.0  Acute blood loss anemia with history of chronic anemia secondary to recurrent upper G.I. bleed from inside induced peptic ulcer disease -- repeat EGD was done by Dr. Mia Creek on 8/27--- showed healed gastric ulcer,, non-bleeding superficial duodenal ulcer -- patient advised to abstain from using NSAIDs -- holding aspirin Plavix -- continue PPI  Internal hemorrhoids/external hemorrhoid/skin tag -- patient requesting surgery. I did examination and appears normal skin tag which is enlarged about 2 to 3 cm. Non-thrombosis nonpainful. Will try use and use oral cream Q ID for now. -- Patient will follow-up PCP as outpatient  Urinary retention -- failed Foley removal and Foley reinserted -- continue Flomax -- will need to follow-up with urology as outpatient for voiding trial  Hyponatremia -- resolved   Hyperlipidemia -- statins  Type II diabetes with circulatory complication/PAD -- I just long-acting insulin according to sugars  PAD history of right BKA -- aspirin Plavix on hold due to G.I. bleed  Peripheral neuropathy -- continue Lyrica  Morbid obesity suspected sleep apnea -- patient unable to give me details regarding the kind of equipment he has at home -- he gets very frustrated when asked details -- will do overnight pulse oximetry if he  qualifies set up oxygen for use at nighttime   Patient is medically best at baseline at present for discharge. Patient has appealed discharge to his insurance. Awaiting insurance decision.  Procedures: EGD Family communication : none Consults : G.I. CODE STATUS: full DVT Prophylaxis : S CD Level of care: Med-Surg Status is: Inpatient Remains inpatient appropriate because:  awaiting insurance decision regarding denial    TOTAL TIME TAKING CARE OF THIS PATIENT: 35 minutes.  >50% time spent on counselling and coordination of care  Note: This dictation was prepared with Dragon dictation along with smaller phrase technology. Any transcriptional errors that result from this process are unintentional.  Enedina Finner M.D    [facility]   CC: Primary care physician; [name], MD

## 2022-11-06 NOTE — Progress Notes (Signed)
Patient started on Overnight Pulse Ox study at 2052, on room air. RN aware.

## 2022-11-07 DIAGNOSIS — K644 Residual hemorrhoidal skin tags: Secondary | ICD-10-CM | POA: Diagnosis not present

## 2022-11-07 DIAGNOSIS — K2900 Acute gastritis without bleeding: Secondary | ICD-10-CM | POA: Diagnosis not present

## 2022-11-07 DIAGNOSIS — E1142 Type 2 diabetes mellitus with diabetic polyneuropathy: Secondary | ICD-10-CM | POA: Diagnosis not present

## 2022-11-07 DIAGNOSIS — D62 Acute posthemorrhagic anemia: Secondary | ICD-10-CM | POA: Diagnosis not present

## 2022-11-07 LAB — GLUCOSE, CAPILLARY
Glucose-Capillary: 114 mg/dL — ABNORMAL HIGH (ref 70–99)
Glucose-Capillary: 137 mg/dL — ABNORMAL HIGH (ref 70–99)
Glucose-Capillary: 165 mg/dL — ABNORMAL HIGH (ref 70–99)
Glucose-Capillary: 182 mg/dL — ABNORMAL HIGH (ref 70–99)

## 2022-11-07 MED ORDER — HYDROCORTISONE ACETATE 25 MG RE SUPP
25.0000 mg | Freq: Two times a day (BID) | RECTAL | Status: DC
  Administered 2022-11-07 – 2022-11-08 (×3): 25 mg via RECTAL
  Filled 2022-11-07 (×3): qty 1

## 2022-11-07 NOTE — Progress Notes (Signed)
[facility]  - [place] [facility]   PATIENT NAME: Russell [name]    MR#:  [id number]  DATE OF BIRTH:  Jul 21, 1965  SUBJECTIVE:  continues to complain about his external hemorrhoids and wants surgery to take a look. Told him I will show the picture taken to surgery and get back with him. No other issues per RN. Sugars stable.  VITALS:  Blood pressure (!) 102/54, pulse (!) 57, temperature 98 F (36.7 C), resp. rate 18, height 5\' 11"  (1.803 m), weight (!) 172.8 kg, SpO2 94%.  PHYSICAL EXAMINATION:   GENERAL:  57 y.o.-year-old patient with no acute distress. Morbidly obese LUNGS: decreased breath sounds bilaterally, no wheezing CARDIOVASCULAR: S1, S2 normal. No murmur   ABDOMEN: Soft, nontender, nondistended. Abdominal obesity  patient has external skin tags/hemorrhoid non-thrombosis there is one which above which is about 2 x 3 cm nontender. EXTREMITIES: BKA stump stable, chronic venous stasis changes present. NEUROLOGIC: nonfocal  patient is alert and awake   LABORATORY PANEL:  CBC Recent Labs  Lab 11/06/22 0501  WBC 4.6  HGB 7.0*  HCT 24.2*  PLT 165    Chemistries  Recent Labs  Lab 11/06/22 0501  NA 138  K 3.5  CL 100  CO2 32  GLUCOSE 122*  BUN 9  CREATININE 0.73  CALCIUM 7.8*    Assessment and Plan Russell [name] is a 57 y.o. Caucasian male with medical history significant for combined systolic and diastolic CHF, right BKA, anxiety and depression, type 2 diabetes mellitus, hypertension, dyslipidemia and morbid obesity, who presented to the emergency room with acute onset of dyspnea.  The patient was just admitted from 8/13 till 8/24 acute blood loss anemia with associated upper GI bleeding and hypovolemic shock.  His EGD showed gastritis and nonbleeding gastric ulcers.  His colonoscopy showed no active bleeding.  He received a total of 4 units of PRBCs during his hospitalization as well as IV iron.  His hemoglobin was 8.3 on 8/18.  He required a  brief IV Levophed for his hypovolemic shock.  He took 2 aspirin that night for headache. He has not taken any more aspirin.   Hgb was 3.9--- 7 unit BT--7.7-7.0  Acute blood loss anemia with history of chronic anemia secondary to recurrent upper G.I. bleed from inside induced peptic ulcer disease -- repeat EGD was done by Dr. Mia Creek on 8/27--- showed healed gastric ulcer,, non-bleeding superficial duodenal ulcer -- patient advised to abstain from using NSAIDs -- holding aspirin Plavix -- continue PPI  Internal hemorrhoids/external hemorrhoid/skin tag -- patient requesting surgery. I did examination and appears normal skin tag which is enlarged about 2 to 3 cm. Non-thrombosis nonpainful. Will try use and use oral cream Q ID for now. -- Patient will follow-up PCP as outpatient -- discussed with Dr. Everlene Farrier from general surgery. Recommends aneurysmal cream,sitz bath and follow-up as outpatient. No surgery indicated at present.  Urinary retention -- failed Foley removal and Foley reinserted -- continue Flomax -- will need to follow-up with urology as outpatient for voiding trial  Hyponatremia -- resolved  Hyperlipidemia -- statins  Type II diabetes with circulatory complication/PAD -- I just long-acting insulin according to sugars  PAD history of right BKA -- aspirin Plavix on hold due to G.I. bleed  Peripheral neuropathy -- continue Lyrica  Morbid obesity suspected sleep apnea -- patient unable to give me details regarding the kind of equipment he has at home -- he gets very frustrated when asked details -- will do  overnight pulse oximetry if he qualifies set up oxygen for use at nighttime   Patient is medically best at baseline at present for discharge. Patient has appealed discharge to his insurance. Awaiting insurance decision.  Procedures: EGD Family communication : none Consults : G.I. CODE STATUS: full DVT Prophylaxis : S CD Level of care: Med-Surg Status is:  Inpatient Remains inpatient appropriate because: awaiting insurance decision regarding denial    TOTAL TIME TAKING CARE OF THIS PATIENT: 35 minutes.  >50% time spent on counselling and coordination of care  Note: This dictation was prepared with Dragon dictation along with smaller phrase technology. Any transcriptional errors that result from this process are unintentional.  Enedina Finner M.D    [facility]   CC: Primary care physician; [name], MD

## 2022-11-07 NOTE — Progress Notes (Signed)
Patient taken off Pulse Ox Sleep Study at 0600 on 2L [place].

## 2022-11-07 NOTE — Plan of Care (Signed)
  Problem: Nutritional: Goal: Maintenance of adequate nutrition will improve Outcome: Progressing   Problem: Nutritional: Goal: Maintenance of adequate nutrition will improve Outcome: Progressing   Problem: Nutrition: Goal: Adequate nutrition will be maintained Outcome: Progressing   Problem: Coping: Goal: Level of anxiety will decrease Outcome: Progressing

## 2022-11-08 DIAGNOSIS — K429 Umbilical hernia without obstruction or gangrene: Secondary | ICD-10-CM | POA: Diagnosis not present

## 2022-11-08 DIAGNOSIS — K623 Rectal prolapse: Secondary | ICD-10-CM | POA: Diagnosis not present

## 2022-11-08 DIAGNOSIS — D62 Acute posthemorrhagic anemia: Secondary | ICD-10-CM | POA: Diagnosis not present

## 2022-11-08 LAB — GLUCOSE, CAPILLARY
Glucose-Capillary: 140 mg/dL — ABNORMAL HIGH (ref 70–99)
Glucose-Capillary: 165 mg/dL — ABNORMAL HIGH (ref 70–99)

## 2022-11-08 MED ORDER — INSULIN DETEMIR 100 UNIT/ML FLEXPEN
25.0000 [IU] | PEN_INJECTOR | Freq: Two times a day (BID) | SUBCUTANEOUS | 0 refills | Status: DC
Start: 2022-11-08 — End: 2022-11-16

## 2022-11-08 MED ORDER — PANTOPRAZOLE SODIUM 40 MG PO TBEC: 40.0000 mg | DELAYED_RELEASE_TABLET | Freq: Two times a day (BID) | ORAL | 3 refills | Status: DC

## 2022-11-08 NOTE — Discharge Instructions (Addendum)
Keep log of your sugars at home Keep your appt with the colorectal surgery as scheduled  DO NOT TAKE ASA, NSAIDS BC powder

## 2022-11-08 NOTE — Discharge Summary (Signed)
Physician Discharge Summary   Patient: Russell [name] MRN: [id number] DOB: December 31, 1965  Admit date:     10/23/2022  Discharge date: 11/08/22  Discharge Physician: Enedina Finner   PCP: [name], MD   Recommendations at discharge:   follow-up PCP Dr. Zada Finders on 916 2024 at 1 PM follow-up general surgery Dr. Elenor Legato on 923 24 at 1:30 PM follow-up Dr. Romie Levee colorectal surgery in [place] on October 8 at 11:30 AM for rectal prolapse follow-up Dr. Lonna Cobb urology on September 30 at 8:30 AM and 2:30 PM for voiding trial resume home health follow-up G.I. Dr. Mia Creek in 1 to 2 weeks  Discharge Diagnoses: Principal Problem:   Acute blood loss anemia Active Problems:   Acute urinary retention   Dyslipidemia   Type 2 diabetes mellitus with peripheral neuropathy (HCC)   Anxiety and depression   Essential hypertension   Acute gastric ulcer with hemorrhage   Major depressive disorder, recurrent episode, moderate (HCC)   Residual hemorrhoidal skin tags   Acute gastritis  Russell [name] is a 57 y.o. Caucasian male with medical history significant for combined systolic and diastolic CHF, right BKA, anxiety and depression, type 2 diabetes mellitus, hypertension, dyslipidemia and morbid obesity, who presented to the emergency room with acute onset of dyspnea.  The patient was just admitted from 8/13 till 8/24 acute blood loss anemia with associated upper GI bleeding and hypovolemic shock.  His EGD showed gastritis and nonbleeding gastric ulcers.  His colonoscopy showed no active bleeding.  He received a total of 4 units of PRBCs during his hospitalization as well as IV iron.  His hemoglobin was 8.3 on 8/18.  He required a brief IV Levophed for his hypovolemic shock.  He took 2 aspirin that night for headache. He has not taken any more aspirin.    Hgb was 3.9--- 7 unit BT--7.7-7.0   Acute blood loss anemia with history of chronic anemia secondary to recurrent upper G.I. bleed from  inside induced peptic ulcer disease -- repeat EGD was done by Dr. Mia Creek on 8/27--- showed healed gastric ulcer,non-bleeding superficial duodenal ulcer -- patient advised to abstain from using NSAIDs -- holding aspirin Plavix -- continue PPI   Internal hemorrhoids/external hemorrhoid/skin tag Rectal Prolapse, small -- patient requesting surgery. I did examination and appears normal skin tag which is enlarged about 2 to 3 cm. Non-thrombosis nonpainful. Will try use and use Anusol cream Q ID for now. -- Patient will follow-up PCP as outpatient -- discussed with Dr. Everlene Farrier from general surgery. Recommends anusol cream,sitz bath and follow-up as outpatient. No surgery indicated at present. -- Patient was reluctant and I did get general surgery PA evaluated patient and found he has a small rectal prolapse with some tenderness but soft and no bleeding. General surgery recommends outpatient colorectal surgery evaluation. I personally called Dr. Romie Levee office in [place] and got appointment for December 07, 2022 at 11:30 AM   Urinary retention -- failed Foley removal and Foley reinserted -- continue Flomax -- will need to follow-up with urology as outpatient for voiding trial-- appointment with Dr. Lonna Cobb has been scheduled   Hyponatremia -- resolved   Hyperlipidemia -- statins   Type II diabetes with circulatory complication/PAD -- I just long-acting insulin according to sugars   PAD history of right BKA -- aspirin Plavix on hold due to G.I. bleed   Peripheral neuropathy -- continue Lyrica   Morbid obesity suspected sleep apnea -- will do overnight pulse oximetry if he qualifies set  up oxygen for use at nighttime--pt qulalifeis for oxygen at night will be arranged per [facility]     Patient is medically best at baseline at present for discharge. Patient is finally agreeable to go home. He declined rehab again.   Procedures: EGD Family communication : spoke with patient's brother  Dorene Sorrow on the phone Consults : G.I., general surgery CODE STATUS: full DVT Prophylaxis : S CD    Pain control - [place] Controlled Substance Reporting System database was reviewed. and patient was instructed, not to drive, operate heavy machinery, perform activities at heights, swimming or participation in water activities or provide baby-sitting services while on Pain, Sleep and Anxiety Medications; until their outpatient Physician has advised to do so again. Also recommended to not to take more than prescribed Pain, Sleep and Anxiety Medications.   Discharge Diet Orders (From admission, onward)     Start     Ordered   11/05/22 0000  Diet Carb Modified        11/05/22 1506            DISCHARGE MEDICATION: Allergies as of 11/08/2022       Reactions   Fluoxetine    Other reaction(s): Hallucination   Shellfish Allergy Hives   Sulfa Antibiotics Hives   Vancomycin    Other reaction(s): Red Man Syndrome   Metformin Nausea Only        Medication List     TAKE these medications    atorvastatin 40 MG tablet Commonly known as: LIPITOR Take 40 mg by mouth daily.   buPROPion 150 MG 24 hr tablet Commonly known as: WELLBUTRIN XL Take 1 tablet (150 mg total) by mouth daily.   cetirizine 10 MG tablet Commonly known as: ZYRTEC Take 10 mg by mouth daily as needed for allergies.   collagenase 250 UNIT/GM ointment Commonly known as: SANTYL Apply topically daily. Notes to patient: Not given in hospital   DULoxetine HCl 30 MG Csdr Take 30 mg by mouth in the morning, at noon, and at bedtime.   furosemide 40 MG tablet Commonly known as: LASIX Take 1 tablet (40 mg total) by mouth 2 (two) times daily.   HumuLIN R 100 UNIT/ML injection Generic drug: insulin regular Inject 0.15 mLs (15 Units total) into the skin 3 (three) times daily before meals.   hydrocortisone 2.5 % rectal cream Commonly known as: ANUSOL-HC Place rectally 4 (four) times daily.   hydrocortisone 25  MG suppository Commonly known as: ANUSOL-HC Place 1 suppository (25 mg total) rectally 2 (two) times daily for 14 days.   insulin detemir 100 UNIT/ML FlexPen Commonly known as: LEVEMIR Inject 25 Units into the skin 2 (two) times daily. What changed: how much to take   Insulin Pen Needle 33G X 5 MM Misc 1 Dose by Does not apply route 2 (two) times daily.   oxyCODONE-acetaminophen 5-325 MG tablet Commonly known as: PERCOCET/ROXICET Take 1 tablet by mouth every 6 (six) hours as needed for severe pain.   pantoprazole 40 MG tablet Commonly known as: PROTONIX Take 1 tablet (40 mg total) by mouth 2 (two) times daily.   potassium chloride SA 20 MEQ tablet Commonly known as: KLOR-CON M Take 20 mEq by mouth 2 (two) times daily as needed (cramps).   pregabalin 150 MG capsule Commonly known as: LYRICA Take 150 mg by mouth 3 (three) times daily.   senna-docusate 8.6-50 MG tablet Commonly known as: Senokot-S Take 2 tablets by mouth daily as needed for mild constipation.  tamsulosin 0.4 MG Caps capsule Commonly known as: FLOMAX Take 1 capsule (0.4 mg total) by mouth daily after breakfast.               Durable Medical Equipment  (From admission, onward)           Start     Ordered   11/08/22 0847  For home use only DME oxygen  Once       Question Answer Comment  Length of Need Lifetime   Mode or (Route) Nasal cannula   Liters per Minute 2   Frequency Only at night (stationary unit needed)   Oxygen conserving device Yes   Oxygen delivery system Gas      11/08/22 0846            Follow-up Information     Regis Bill, MD. Schedule an appointment as soon as possible for a visit in 2 week(s).   Specialty: Gastroenterology Why: GI bleed;  Office will call Patient w/ Appt Contact information: [address] [place] [address] [phone]         [name], MD. Go on 11/15/2022.   Specialty: Family Medicine Why: hopsital f/u;   Appt @ 1:00 pm Contact information: [address] [place] [address] [phone]         Leafy Ro, MD. Go on 11/22/2022.   Specialty: General Surgery Why: for external hemorrhoids;  Appt @ 1:30 pm Contact information: [address] [place] [address] [phone]         Riki Altes, MD Follow up on 11/29/2022.   Specialty: Urology Why: at 8:30 amd and 2:30 pm for voiding trial Contact information: [address] [name] [address] [phone]         Romie Levee, MD. Go on 12/07/2022.   Specialties: General Surgery, Colon and Rectal Surgery Why: 12/07/2022  at 11;30 am for Rectal prolapse Contact information: [address] [place] [address] [phone]                Discharge Exam: Ceasar Mons Weights   10/23/22 2209 11/03/22 0329  Weight: (!) 170 kg (!) 172.8 kg   GENERAL:  57 y.o.-year-old patient with no acute distress. Morbidly obese LUNGS: decreased breath sounds bilaterally, no wheezing CARDIOVASCULAR: S1, S2 normal. No murmur   ABDOMEN: Soft, nontender, nondistended. Abdominal obesity  patient has external skin tags/hemorrhoid non-thrombosis there is one which above which is about 2 x 3 cm nontender. EXTREMITIES: BKA stump stable, chronic venous stasis changes present. NEUROLOGIC: nonfocal  patient is alert and awake  Condition at discharge: fair  The results of significant diagnostics from this hospitalization (including imaging, microbiology, ancillary and laboratory) are listed below for reference.   Imaging Studies: DG Knee Complete 4 Views Left  Result Date: 11/03/2022 CLINICAL DATA:  [id number] Knee pain, left [id number] x1.5 months without trauma EXAM: LEFT KNEE - COMPLETE 4+ VIEW COMPARISON:  08/18/2017 FINDINGS: No fracture or dislocation. No effusion. Mild tricompartmental marginal spurring and narrowing of articular cartilage. Alignment preserved. Normal  mineralization. Regional soft tissues unremarkable. IMPRESSION: Mild tricompartmental degenerative changes. No acute findings. Electronically Signed   By: Corlis Leak M.D.   On: 11/03/2022 17:57   CT ABDOMEN PELVIS W CONTRAST  Result Date: 10/24/2022 CLINICAL DATA:  Blood loss. EXAM: CT ABDOMEN AND PELVIS WITH CONTRAST TECHNIQUE: Multidetector CT imaging of the abdomen and pelvis was performed using the standard protocol following bolus  administration of intravenous contrast. RADIATION DOSE REDUCTION: This exam was performed according to the departmental dose-optimization program which includes automated exposure control, adjustment of the mA and/or kV according to patient size and/or use of iterative reconstruction technique. CONTRAST:  OMNIPAQUE IOHEXOL 300 MG/ML  SOLN COMPARISON:  CT 10/12/2022. FINDINGS: Lower chest: There is some linear opacity lung bases likely scar or atelectasis. No pleural effusion. Coronary artery calcifications are seen. There are some prominent nodes identified along the mediastinum at the edge of the imaging field, unchanged from previous. Stable nodular area of thickening along the subcutaneous fat along the anterior aspect of the lower chest wall right of midline on series 2, image 20 Hepatobiliary: No focal liver abnormality is seen. No gallstones, gallbladder wall thickening, or biliary dilatation. Patent portal vein. Pancreas: Severe atrophy of the pancreas. Spleen: Normal in size without focal abnormality. Adrenals/Urinary Tract: Adrenal glands are preserved. No enhancing renal mass or collecting system dilatation. The ureters have normal course and caliber down to the bladder. The right kidney is malrotated, a congenital variant distended urinary bladder Stomach/Bowel: No oral contrast. The large bowel has a normal course and caliber with some scattered stool. Appendix is not seen. There is some surgical changes along the posterior aspect of the cecum. The stomach and  small bowel are nondilated. Vascular/Lymphatic: Normal caliber aorta and IVC with mild vascular calcifications. There are some small retroperitoneal nodes identified, nonpathologic by size criteria. Similar nodes along the pelvic sidewall regions. These are unchanged from previous. Reproductive: Prostate is unremarkable. Other: No retroperitoneal hematoma. No ascites or fluid collection. There is anasarca. Additional skin thickening with stranding along the anterior pelvic wall the level of the perineum, increasing from the prior examination. Please correlate with clinical findings such as cellulitis. Small fat containing umbilical hernia. No soft tissue gas seen. Musculoskeletal: Moderate degenerative changes along the spine with some disc bulging and osteophytes. Degenerative changes along the pelvis. IMPRESSION: No retroperitoneal hematoma identified.  No ascites or free air. No bowel obstruction. Stable subcutaneous area of thickening and nodularity along the anterior abdominal, lower chest wall right of midline. Increasing skin thickening with some stranding along the anterior low pelvis, perineal region. Please correlate with clinical findings. Electronically Signed   By: Karen Kays M.D.   On: 10/24/2022 14:54   DG Chest Portable 1 View  Result Date: 10/23/2022 CLINICAL DATA:  Shortness of breath and chest pain.  History of CHF. EXAM: PORTABLE CHEST 1 VIEW COMPARISON:  Most recent radiograph 10/12/2022 FINDINGS: Cardiomegaly is stable. Vascular congestion is unchanged. There is improving left lung base opacity. No new airspace disease. No large pleural effusion on this AP view. No pneumothorax. IMPRESSION: 1. Stable cardiomegaly and vascular congestion. 2. Improving left lung base opacity. Electronically Signed   By: Narda Rutherford M.D.   On: 10/23/2022 23:17   DG Chest Port 1 View  Result Date: 10/15/2022 CLINICAL DATA:  Hypoxia. EXAM: PORTABLE CHEST 1 VIEW COMPARISON:  One-view chest x-ray  10/12/2022 FINDINGS: The heart is mildly enlarged, exaggerated by low lung volumes. Moderate pulmonary vascular congestion is present. Mild left basilar airspace opacities are noted. The lungs are otherwise clear. IMPRESSION: 1. Cardiomegaly and moderate pulmonary vascular congestion. 2. Mild left basilar airspace disease likely reflects atelectasis. Infection is not excluded. Electronically Signed   By: Marin Roberts M.D.   On: 10/15/2022 11:30   NM GI Blood Loss  Result Date: 10/13/2022 CLINICAL DATA:  Upper GI bleed. EXAM: NUCLEAR MEDICINE GASTROINTESTINAL BLEEDING SCAN TECHNIQUE: Sequential  abdominal images were obtained following intravenous administration of Tc-54m labeled red blood cells. RADIOPHARMACEUTICALS:  19.75 mCi Tc-15m pertechnetate in-vitro labeled red cells. COMPARISON:  CT abdomen pelvis dated 10/13/2022. FINDINGS: Radiotracer noted in the cardiac blood pool and aortoiliac vessels. Physiologic uptake in the spleen and liver. No activity noted conforming to the bowel to suggest active GI bleed. IMPRESSION: No scintigraphic evidence of active GI bleed. Electronically Signed   By: Elgie Collard M.D.   On: 10/13/2022 19:51   CT ABDOMEN PELVIS W CONTRAST  Addendum Date: 10/13/2022   ADDENDUM REPORT: 10/13/2022 00:58 ADDENDUM: There is an area of focal subcutaneous induration identified in the right anterior abdominal wall best seen on image number 30 of series 2. When compared with multiple previous exams dating back to 2020 this area has been present and likely represents an area of subcutaneous scarring or fat necrosis. Electronically Signed   By: Alcide Clever M.D.   On: 10/13/2022 00:58   Result Date: 10/13/2022 CLINICAL DATA:  Abdominal pain EXAM: CT ABDOMEN AND PELVIS WITH CONTRAST TECHNIQUE: Multidetector CT imaging of the abdomen and pelvis was performed using the standard protocol following bolus administration of intravenous contrast. RADIATION DOSE REDUCTION: This exam was  performed according to the departmental dose-optimization program which includes automated exposure control, adjustment of the mA and/or kV according to patient size and/or use of iterative reconstruction technique. CONTRAST:  OMNIPAQUE IOHEXOL 350 MG/ML SOLN COMPARISON:  09/29/2021 FINDINGS: Lower chest: No acute abnormality. Hepatobiliary: No focal liver abnormality is seen. No gallstones, gallbladder wall thickening, or biliary dilatation. Pancreas: Fatty replacement of the pancreas is noted. Spleen: Normal in size without focal abnormality. Adrenals/Urinary Tract: Adrenal glands are within normal limits. Kidneys demonstrate a normal enhancement pattern bilaterally. No renal calculi or obstructive changes are seen. The bladder is partially distended. Stomach/Bowel: No obstructive or inflammatory changes of colon are noted. The appendix has been surgically removed. Small bowel and stomach are unremarkable. Vascular/Lymphatic: No significant vascular findings are present. No enlarged abdominal or pelvic lymph nodes. Reproductive: Prostate is unremarkable. Other: No ascites is noted. Small fat containing umbilical hernia is noted. Musculoskeletal: Degenerative changes of lumbar spine are noted. IMPRESSION: No acute abnormality noted. Electronically Signed: By: Alcide Clever M.D. On: 10/13/2022 00:32   DG Chest Portable 1 View  Result Date: 10/12/2022 CLINICAL DATA:  Epigastric abdominal pain. Pale with bloody bowel movements. EXAM: PORTABLE CHEST 1 VIEW COMPARISON:  None 05/18/2020 FINDINGS: Cardiac enlargement. No vascular congestion, edema, or consolidation. No pleural effusions. No pneumothorax. Mediastinal contours appear intact. IMPRESSION: Cardiac enlargement.  Lungs are clear. Electronically Signed   By: Burman Nieves M.D.   On: 10/12/2022 22:35    Microbiology: Results for orders placed or performed during the hospital encounter of 10/23/22  Resp panel by RT-PCR (RSV, Flu A&B, Covid) Anterior  Nasal Swab     Status: None   Collection Time: 10/23/22 10:41 PM   Specimen: Anterior Nasal Swab  Result Value Ref Range Status   SARS Coronavirus 2 by RT PCR NEGATIVE NEGATIVE Final    Comment: (NOTE) SARS-CoV-2 target nucleic acids are NOT DETECTED.  The SARS-CoV-2 RNA is generally detectable in upper respiratory specimens during the acute phase of infection. The lowest concentration of SARS-CoV-2 viral copies this assay can detect is 138 copies/mL. A negative result does not preclude SARS-Cov-2 infection and should not be used as the sole basis for treatment or other patient management decisions. A negative result may occur with  improper specimen collection/handling, submission  of specimen other than nasopharyngeal swab, presence of viral mutation(s) within the areas targeted by this assay, and inadequate number of viral copies(<138 copies/mL). A negative result must be combined with clinical observations, patient history, and epidemiological information. The expected result is Negative.  Fact Sheet for Patients:  BloggerCourse.com  Fact Sheet for Healthcare Providers:  SeriousBroker.it  This test is no t yet approved or cleared by the [place] FDA and  has been authorized for detection and/or diagnosis of SARS-CoV-2 by FDA under an Emergency Use Authorization (EUA). This EUA will remain  in effect (meaning this test can be used) for the duration of the COVID-19 declaration under Section 564(b)(1) of the Act, 21 U.S.C.section 360bbb-3(b)(1), unless the authorization is terminated  or revoked sooner.       Influenza A by PCR NEGATIVE NEGATIVE Final   Influenza B by PCR NEGATIVE NEGATIVE Final    Comment: (NOTE) The Xpert Xpress SARS-CoV-2/FLU/RSV plus assay is intended as an aid in the diagnosis of influenza from Nasopharyngeal swab specimens and should not be used as a sole basis for treatment. Nasal washings  and aspirates are unacceptable for Xpert Xpress SARS-CoV-2/FLU/RSV testing.  Fact Sheet for Patients: BloggerCourse.com  Fact Sheet for Healthcare Providers: SeriousBroker.it  This test is not yet approved or cleared by the [place] FDA and has been authorized for detection and/or diagnosis of SARS-CoV-2 by FDA under an Emergency Use Authorization (EUA). This EUA will remain in effect (meaning this test can be used) for the duration of the COVID-19 declaration under Section 564(b)(1) of the Act, 21 U.S.C. section 360bbb-3(b)(1), unless the authorization is terminated or revoked.     Resp Syncytial Virus by PCR NEGATIVE NEGATIVE Final    Comment: (NOTE) Fact Sheet for Patients: BloggerCourse.com  Fact Sheet for Healthcare Providers: SeriousBroker.it  This test is not yet approved or cleared by the [place] FDA and has been authorized for detection and/or diagnosis of SARS-CoV-2 by FDA under an Emergency Use Authorization (EUA). This EUA will remain in effect (meaning this test can be used) for the duration of the COVID-19 declaration under Section 564(b)(1) of the Act, 21 U.S.C. section 360bbb-3(b)(1), unless the authorization is terminated or revoked.  Performed at [facility], [address] [place], [place] [address]     Labs: CBC: Recent Labs  Lab 11/03/22 0544 11/04/22 0529 11/05/22 0527 11/06/22 0501  WBC  --  5.5 4.9 4.6  HGB 7.8* 7.2* 7.7* 7.0*  HCT 25.8* 23.8* 25.9* 24.2*  MCV  --  82.4 83.3 83.4  PLT  --  172 180 165   Basic Metabolic Panel: Recent Labs  Lab 11/03/22 0544 11/04/22 0529 11/05/22 0527 11/06/22 0501  NA 139 136 138 138  K 3.9 4.0 3.9 3.5  CL 102 101 102 100  CO2 29 28 30  32  GLUCOSE 81 141* 158* 122*  BUN 15 15 13 9   CREATININE 0.67 0.87 0.78 0.73  CALCIUM 7.9* 7.4* 7.7* 7.8*   Liver Function Tests: No  results for input(s): "AST", "ALT", "ALKPHOS", "BILITOT", "PROT", "ALBUMIN" in the last 168 hours. CBG: Recent Labs  Lab 11/07/22 1222 11/07/22 1706 11/07/22 2133 11/08/22 0824 11/08/22 1206  GLUCAP 137* 165* 182* 140* 165*    Discharge time spent: greater than 30 minutes.  Signed: Enedina Finner, MD [facility] 11/08/2022

## 2022-11-08 NOTE — TOC Progression Note (Addendum)
Transition of Care [facility]) - Progression Note    Patient Details  Name: Russell [name] MRN: [id number] Date of Birth: 1965/06/20  Transition of [facility] CM/SW Contact  Hetty Ely, RN Phone Number: 11/08/2022, 11:09 AM  Clinical Narrative: CM spoke with patient at length about discharge, he did call [employer] to appeal again today, says he wants his Hemorrhoids taken care of they are inside and comes out during defecation and very painful. Says now he's having pain with urination, catheter is in. Patient says his Dilaudid was discontinues and the current pain regimen is not effective. Patient says he will discharge home when Hemorrhoids are taken care of. Discussed SNF, he's not receptive due to apartment expenses and care of his cat. Will follow Appeal Process per guidelines. . 12:10 pm CM spoke with patient about the appeal process, called [phone] spoke with [facility], was informed that there was no documentation from 10:50 am call, however the 11/05/22 appeal was closed. Patient informed of Surgical evaluation and surgery was not required at this time, patient angry says he was not evaluated by Surgery and no one did an internal assessment only external. Patient was ANGRY would not talk with [employer] specialist , I'm waiting on return call from another [employer] representative about the 9/6 appeal. Patient refuse to do another appeal at this time.   Expected Discharge Plan: Home/Self Care Barriers to Discharge: Continued Medical Work up  Expected Discharge Plan and Services       Living arrangements for the past 2 months: Single Family Home Expected Discharge Date: 11/08/22                                     Social Determinants of Health (SDOH) Interventions SDOH Screenings   Food Insecurity: No Food Insecurity (10/24/2022)  Housing: Low Risk  (10/24/2022)  Transportation Needs: No Transportation Needs (10/24/2022)  Utilities: Not At Risk (10/24/2022)  Financial Resource Strain:  Medium Risk (06/17/2022)   Received from [facility]  Tobacco Use: Low Risk  (10/26/2022)    Readmission Risk Interventions    10/25/2022    3:55 PM  Readmission Risk Prevention Plan  Transportation Screening Complete  PCP or Specialist Appt within 3-5 Days Complete  HRI or Home Care Consult Complete  Social Work Consult for Recovery Care Planning/Counseling Complete  Palliative Care Screening Not Applicable  Medication Review Oceanographer) Complete

## 2022-11-08 NOTE — Progress Notes (Signed)
1624 Foley removed per DEMAND of PATIENT. This nurse informed pt that per Dr Allena Katz removing the foley is not recommended as pt had issues this admission with retention. Pt offered leg bag and explained the importance of foley until follow up with urology for voiding trial witnessed by April Holding and [place] NT. Pt still very adamant about removing foley, refusing to d/c home unless removed. Foley removed and explained possible chance of urinary retention. Pt assures nurses that he will drink fluids and transfer to toilet at home to void. Dr Allena Katz made aware.

## 2022-11-08 NOTE — Progress Notes (Signed)
OT Cancellation Note  Patient Details Name: Russell [name] MRN: [id number] DOB: 10/22/1965   Cancelled Treatment:    Reason Eval/Treat Not Completed: Patient declined, no reason specified. OT arrived to pt's room as he called for assistance with call bell. Pt needing assistance to write a number and asking questions in regards to planned hopspital discharge. OT asking nursing staff and providing pt with answers to his questions. Pt very tearful over late discharge and attempting to set up assistance at home. He politely declined therapy at this time and reports no needs from nursing. All needs within reach as therapist exits the room.   Jackquline Denmark, MS, OTR/L , CBIS ascom [phone]  11/08/22, 4:06 PM

## 2022-11-08 NOTE — TOC Transition Note (Addendum)
Transition of Care [facility]) - CM/SW Discharge Note   Patient Details  Name: Russell [name] MRN: [id number] Date of Birth: 1965/08/07  [facility] CM/SW Contact:  Hetty Ely, RN Phone Number: 11/08/2022, 3:23 PM   Clinical Narrative:  CM spoke with patient about discharge plan and verified address, patient says he has apartment key. CM discussed SNF again with patient he declined, concerned about his Cat. Denies having home oxygen. [employer] [name] called and will deliver oxygen to home this evening. Patient understands he needs Ox 2l at night and it will be delivered this evening. EMS called and scheduled for pick up with Oxygen. CM spoke with patient who is crying because he is unable to get in touch with Brother to see if his niece could provide assistance with him at home, CM informed patient that he will have [name] provide HHPT/OT/Aide/SW services, patient says he will also pay for niece to come and assist with bath and shave. Spoke with Nurse who will have NA to assist with bath and shave. CM also called brother, [phone] and left message. CM also called mother [phone], mother unable to contact brother, however will let him know of discharge when he calls this evening.    Final next level of care: Home w Home Health Services Barriers to Discharge: Barriers Resolved   Patient Goals and CMS Choice      Discharge Placement Patient declined SNF.                      Patient and family notified of of transfer: 11/08/22  Discharge Plan and Services Additional resources added to the After Visit Summary for                  DME Arranged: Oxygen DME Agency: [employer] Date DME Agency Contacted: 11/08/22 Time DME Agency Contacted: 1520 Representative spoke with at DME Agency: Dorthula Perfect HH Arranged: PT, OT, Nurse's Aide, Social Work [facility] Agency: [facility] Date [facility] Agency Contacted: 11/05/22 Time HH Agency Contacted: 0400 Representative spoke with  at [facility] Agency: Kandee Keen  Social Determinants of Health (SDOH) Interventions SDOH Screenings   Food Insecurity: No Food Insecurity (10/24/2022)  Housing: Low Risk  (10/24/2022)  Transportation Needs: No Transportation Needs (10/24/2022)  Utilities: Not At Risk (10/24/2022)  Financial Resource Strain: Medium Risk (06/17/2022)   Received from [facility]  Tobacco Use: Low Risk  (10/26/2022)     Readmission Risk Interventions    10/25/2022    3:55 PM  Readmission Risk Prevention Plan  Transportation Screening Complete  PCP or Specialist Appt within 3-5 Days Complete  HRI or Home Care Consult Complete  Social Work Consult for Recovery Care Planning/Counseling Complete  Palliative Care Screening Not Applicable  Medication Review Ocean[employer]) Complete

## 2022-11-08 NOTE — Consult Note (Signed)
[facility] SURGICAL CONSULTATION NOTE (initial) - cpt: 82956   HISTORY OF PRESENT ILLNESS (HPI):  57 y.o. male presented to [facility] ED initially on 08/24 secondary to complaints of SOB.Patient also with admission 08/13 - 08/20 secondary to upper GI bleeding. EGD on 08/17 with Dr Norma Fredrickson showed none bleeding ulcers and gastritis. Also underwent colonoscopy 08/17. That admission seems he received 3 units pRBCs. He was discharged home on 08/20. During this present admission, has undergone CT Abdomen/Pelvis on 08/25 which was without acute intra-abdominal findings. Repeat EGD on 08/27 showed non-bleeding gastric ulcers. Has needed a total 7 units pRBCs this admission. He has also had issues with urinary retention needing foley catheter and on daily Flomax. At baseline, he has history of CHF, HTN, HLD, obesity. He is on Plavix and ASA but this is being held. Most recent labs show as of 09/07 show relatively stable Hgb to 7.0, he is without 4.6K, renal function normal sCr - 0.73. He also has been complaining of what he believes to external hemorrhoid. Have reviewed pictures from this admission. No evidence of bleeding. Again, colonoscopy on 08/17 showed small, non-bleeding internal hemorrhoids. Patient also with umbilical hernia. He believes he has had this "for some time." This is soft. He is able to pushes it in without pain.   Surgery is consulted by hospitalist physician Dr. Enedina Finner, MD in this context for evaluation and management of external hemorrhoid vs perianal skin tag.   PAST MEDICAL HISTORY (PMH):  Past Medical History:  Diagnosis Date   Anxiety    CHF (congestive heart failure) (HCC)    EF 30-35%   Chronic diastolic (congestive) heart failure (HCC)    Depression    Diabetes mellitus without complication (HCC)    Hyperlipemia    Hypertension    Morbid obesity (HCC)    Peripheral edema      PAST SURGICAL HISTORY (PSH):  Past Surgical History:  Procedure Laterality  Date   APPENDECTOMY     BELOW KNEE LEG AMPUTATION     Right leg   BIOPSY  10/14/2022   Procedure: BIOPSY;  Surgeon: Norma Fredrickson, Boykin Nearing, MD;  Location: [facility] ENDOSCOPY;  Service: Gastroenterology;;   COLONOSCOPY N/A 10/16/2022   Procedure: COLONOSCOPY;  Surgeon: [name], MD;  [facility];  Service: Gastroenterology;  Laterality: N/A;   ESOPHAGOGASTRODUODENOSCOPY (EGD) WITH PROPOFOL N/A 10/14/2022   Procedure: ESOPHAGOGASTRODUODENOSCOPY (EGD) WITH PROPOFOL;  Surgeon: Toledo, Boykin Nearing, MD;  Location: [facility] ENDOSCOPY;  Service: Gastroenterology;  Laterality: N/A;   ESOPHAGOGASTRODUODENOSCOPY (EGD) WITH PROPOFOL N/A 10/26/2022   Procedure: ESOPHAGOGASTRODUODENOSCOPY (EGD) WITH PROPOFOL;  Surgeon: Regis Bill, MD;  Location: [facility] ENDOSCOPY;  Service: Endoscopy;  Laterality: N/A;   TONSILLECTOMY       MEDICATIONS:  Prior to Admission medications   Medication Sig Start Date End Date Taking? Authorizing Provider  atorvastatin (LIPITOR) 40 MG tablet Take 40 mg by mouth daily.    Yes [provider]  DULoxetine HCl 30 MG CSDR Take 30 mg by mouth in the morning, at noon, and at bedtime. 10/19/22  Yes Wieting, Richard, MD  furosemide (LASIX) 40 MG tablet Take 1 tablet (40 mg total) by mouth 2 (two) times daily. 10/19/22  Yes Wieting, Richard, MD  HUMULIN R 100 UNIT/ML injection Inject 0.15 mLs (15 Units total) into the skin 3 (three) times daily before meals. 10/19/22  Yes Wieting, Richard, MD  insulin detemir (LEVEMIR) 100 UNIT/ML FlexPen Inject 42 Units into the skin 2 (two) times daily. 10/19/22  Yes Alford Highland, MD  pantoprazole (PROTONIX) 40 MG tablet Take 1 tablet (40 mg total) by mouth 2 (two) times daily. 10/19/22  Yes Wieting, Richard, MD  potassium chloride SA (K-DUR,KLOR-CON) 20 MEQ tablet Take 20 mEq by mouth 2 (two) times daily as needed (cramps). 11/08/17  Yes [provider]  pregabalin (LYRICA) 150 MG capsule Take 150 mg by mouth 3 (three) times daily.     Yes [provider]  tamsulosin (FLOMAX) 0.4 MG CAPS capsule Take 1 capsule (0.4 mg total) by mouth daily after breakfast. 10/19/22  Yes Renae Gloss, Richard, MD  buPROPion (WELLBUTRIN XL) 150 MG 24 hr tablet Take 1 tablet (150 mg total) by mouth daily. 11/06/22 12/06/22  Charise Killian, MD  cetirizine (ZYRTEC) 10 MG tablet Take 10 mg by mouth daily as needed for allergies. 06/23/20   [provider]  collagenase (SANTYL) ointment Apply topically daily. Patient not taking: Reported on 10/24/2022 02/02/18   Adrian Saran, MD  hydrocortisone (ANUSOL-HC) 2.5 % rectal cream Place rectally 4 (four) times daily. 11/06/22   Enedina Finner, MD  hydrocortisone (ANUSOL-HC) 25 MG suppository Place 1 suppository (25 mg total) rectally 2 (two) times daily for 14 days. 11/05/22 11/19/22  Charise Killian, MD  Insulin Pen Needle 33G X 5 MM MISC 1 Dose by Does not apply route 2 (two) times daily. 10/19/22   Alford Highland, MD  oxyCODONE-acetaminophen (PERCOCET/ROXICET) 5-325 MG tablet Take 1 tablet by mouth every 6 (six) hours as needed for severe pain. 10/19/22   Alford Highland, MD  senna-docusate (SENOKOT-S) 8.6-50 MG tablet Take 2 tablets by mouth daily as needed for mild constipation.    [provider]     ALLERGIES:  Allergies  Allergen Reactions   Fluoxetine     Other reaction(s): Hallucination   Shellfish Allergy Hives   Sulfa Antibiotics Hives   Vancomycin     Other reaction(s): Red Man Syndrome   Metformin Nausea Only     SOCIAL HISTORY:  Social History   Socioeconomic History   Marital status: Legally Separated    Spouse name: Not on file   Number of children: Not on file   Years of education: Not on file   Highest education level: Not on file  Occupational History   Not on file  Tobacco Use   Smoking status: Never   Smokeless tobacco: Never  Vaping Use   Vaping status: Never Used  Substance and Sexual Activity   Alcohol use: No   Drug use: No   Sexual  activity: Not Currently  Other Topics Concern   Not on file  Social History Narrative   Living with mother now   Wheelchair bound at baseline   Social Determinants of Health   Financial Resource Strain: Medium Risk (06/17/2022)   Received from [facility]   Overall Financial Resource Strain (CARDIA)    Difficulty of Paying Living Expenses: Somewhat hard  Food Insecurity: No Food Insecurity (10/24/2022)   Hunger Vital Sign    Worried About Running Out of Food in the Last Year: Never true    Ran Out of Food in the Last Year: Never true  Transportation Needs: No Transportation Needs (10/24/2022)   PRAPARE - Administrator, Civil Service (Medical): No    Lack of Transportation (Non-Medical): No  Physical Activity: Not on file  Stress: Not on file  Social Connections: Not on file  Intimate Partner Violence: Not At Risk (10/24/2022)   Humiliation, Afraid, Rape,  and Kick questionnaire    Fear of Current or Ex-Partner: No    Emotionally Abused: No    Physically Abused: No    Sexually Abused: No     FAMILY HISTORY:  Family History  Problem Relation Age of Onset   COPD Mother    Diabetes Father    CAD Father    Kidney cancer Brother       REVIEW OF SYSTEMS:  Review of Systems  Constitutional:  Negative for chills and fever.  Cardiovascular:  Negative for chest pain and palpitations.  Gastrointestinal:  Positive for abdominal pain. Negative for blood in stool, constipation, diarrhea, melena, nausea and vomiting.  Genitourinary:  Positive for dysuria (Retention). Negative for urgency.  Neurological:  Negative for dizziness and headaches.  All other systems reviewed and are negative.   VITAL SIGNS:  Temp:  [98.2 F (36.8 C)-99.2 F (37.3 C)] 99.2 F (37.3 C) (09/09 0838) Pulse Rate:  [56-69] 69 (09/09 0838) Resp:  [16-18] 18 (09/09 0838) BP: (107-148)/(55-81) 134/73 (09/09 0838) SpO2:  [90 %-100 %] 99 % (09/09 0838)     Height: 5\' 11"  (180.3 cm)  Weight: (!) 172.8 kg BMI (Calculated): 53.16   INTAKE/OUTPUT:  09/08 0701 - 09/09 0700 In: -  Out: 1900 [Urine:1900]  PHYSICAL EXAM:  Physical Exam Vitals and nursing note reviewed. Exam conducted with a chaperone present.  Constitutional:      General: He is not in acute distress.    Appearance: He is obese. He is not ill-appearing.     Comments: Patient resting in bed; NAD  HENT:     Head: Normocephalic and atraumatic.  Eyes:     General: No scleral icterus.    Conjunctiva/sclera: Conjunctivae normal.  Cardiovascular:     Rate and Rhythm: Normal rate.     Pulses: Normal pulses.  Abdominal:     General: Abdomen is flat. There is no distension.     Palpations: Abdomen is soft.     Hernia: A hernia is present. Hernia is present in the umbilical area.     Comments: Abdomen is obese, soft, non-tender, non-distended, no rebound/guarding. He does have a soft, reducible umbilical hernia.   Genitourinary:    Prostate: Normal.     Rectum: External hemorrhoid present. No mass, tenderness or internal hemorrhoid.     Comments: Chaperone present for examination. He has a small external hemorrhoid, this is non-tender, there is no evidence of thrombosis, no active bleeding. Internal examination is benign, no masses, no bleeding. Brown stool in rectal vault.   After some time, patient requested I evaluate him again after a BM. At that time, he did have small rectal prolapse, this was tender but soft, no bleeding.   Foley in place  Musculoskeletal:     Comments: Right BKA  Skin:    General: Skin is warm and dry.     Coloration: Skin is not pale.     Findings: No erythema.  Neurological:     General: No focal deficit present.     Mental Status: He is alert and oriented to person, place, and time.  Psychiatric:        Mood and Affect: Mood normal.        Behavior: Behavior normal.      Labs:     Latest Ref Rng & Units 11/06/2022    5:01 AM 11/05/2022    5:27 AM 11/04/2022    5:29 AM   CBC  WBC 4.0 - 10.5 K/uL 4.6  4.9  5.5   Hemoglobin 13.0 - 17.0 g/dL 7.0  7.7  7.2   Hematocrit 39.0 - 52.0 % 24.2  25.9  23.8   Platelets 150 - 400 K/uL 165  180  172       Latest Ref Rng & Units 11/06/2022    5:01 AM 11/05/2022    5:27 AM 11/04/2022    5:29 AM  CMP  Glucose 70 - 99 mg/dL 045  409  811   BUN 6 - 20 mg/dL 9  13  15    Creatinine 0.61 - 1.24 mg/dL 9.14  7.82  9.56   Sodium 135 - 145 mmol/L 138  138  136   Potassium 3.5 - 5.1 mmol/L 3.5  3.9  4.0   Chloride 98 - 111 mmol/L 100  102  101   CO2 22 - 32 mmol/L 32  30  28   Calcium 8.9 - 10.3 mg/dL 7.8  7.7  7.4     Imaging studies:   CT Abdomen/Pelvis (10/22/2022) personally reviewed without acute intra-abdominal findings, and radiologist report reviewed below:  IMPRESSION: No retroperitoneal hematoma identified.  No ascites or free air.   No bowel obstruction.   Stable subcutaneous area of thickening and nodularity along the anterior abdominal, lower chest wall right of midline.   Increasing skin thickening with some stranding along the anterior low pelvis, perineal region. Please correlate with clinical findings.   EGD on 08/15 and 08/27 personally reviewed   Colonoscopy on 08/17 personally reviewed     Assessment/Plan:  57 y.o. male with non-tender, non-thrombosed, non-bleeding external hemorrhoid however after examination following BM he was found to have small rectal prolapse, complicated by multiple comorbidities including morbid obesity, need for supplemental O2, poor functional status, BKA, urinary retention requiring foley catheter, CHF, HTN, HLD   - Patient's initial DRE was relatively unremarkable with small, non-bleeding, non-thrombosed external hemorrhoid vs hemorrhoidal skin tag. However, patient the requested I examination after BM, which did reveal slight rectal prolapse. This was soft, tender of course, nut non-bleeding. Nothing to do emergently from ou perspective and unfortunately non of the  surgeon perform this type of procedure. Will need referral to outpatient colorectal surgery for evaluation. Continue symptomatic management, avoid constipation, Sitz baths, Anusol.  - He does have a soft, reducible umbilical hernia   - Further management per primary service  All of the above findings and recommendations were discussed with the patient, and all of patient's questions were answered to his expressed satisfaction.  Thank you for the opportunity to participate in this patient's care.   -- Lynden Oxford, PA-C  [facility] 11/08/2022, 1:08 PM M-F: 7am - 4pm

## 2022-11-08 NOTE — Progress Notes (Signed)
PT Cancellation Note  Patient Details Name: DASHIEL WESSMAN MRN: [id number] DOB: 11/09/1965   Cancelled Treatment:    Reason Eval/Treat Not Completed: Other (comment) (Patient declined PT due to hemorrhoid pain and wanting to rest. He asked for therapist to leave the room. PT will continue with attempts as appropriate and as patient willing to participate.)  Donna Bernard, PT, MPT  [facility] 11/08/2022, 10:13 AM

## 2022-11-08 NOTE — Plan of Care (Signed)

## 2022-11-09 ENCOUNTER — Other Ambulatory Visit: Payer: Self-pay

## 2022-11-09 ENCOUNTER — Encounter: Payer: Self-pay | Admitting: Internal Medicine

## 2022-11-09 ENCOUNTER — Observation Stay
Admission: EM | Admit: 2022-11-09 | Discharge: 2022-11-16 | Disposition: A | Discharge status: Skilled Nursing Facility | Payer: 59 | Attending: Hospitalist | Admitting: Hospitalist

## 2022-11-09 DIAGNOSIS — I739 Peripheral vascular disease, unspecified: Secondary | ICD-10-CM | POA: Diagnosis not present

## 2022-11-09 DIAGNOSIS — Z9889 Other specified postprocedural states: Secondary | ICD-10-CM | POA: Diagnosis not present

## 2022-11-09 DIAGNOSIS — N400 Enlarged prostate without lower urinary tract symptoms: Secondary | ICD-10-CM | POA: Diagnosis not present

## 2022-11-09 DIAGNOSIS — Z89511 Acquired absence of right leg below knee: Secondary | ICD-10-CM | POA: Diagnosis not present

## 2022-11-09 DIAGNOSIS — R339 Retention of urine, unspecified: Secondary | ICD-10-CM | POA: Diagnosis not present

## 2022-11-09 DIAGNOSIS — R2689 Other abnormalities of gait and mobility: Principal | ICD-10-CM | POA: Insufficient documentation

## 2022-11-09 DIAGNOSIS — E1140 Type 2 diabetes mellitus with diabetic neuropathy, unspecified: Secondary | ICD-10-CM | POA: Insufficient documentation

## 2022-11-09 DIAGNOSIS — I110 Hypertensive heart disease with heart failure: Secondary | ICD-10-CM | POA: Insufficient documentation

## 2022-11-09 DIAGNOSIS — R531 Weakness: Secondary | ICD-10-CM

## 2022-11-09 DIAGNOSIS — K6289 Other specified diseases of anus and rectum: Secondary | ICD-10-CM | POA: Diagnosis not present

## 2022-11-09 DIAGNOSIS — D62 Acute posthemorrhagic anemia: Secondary | ICD-10-CM | POA: Diagnosis present

## 2022-11-09 DIAGNOSIS — Z79899 Other long term (current) drug therapy: Secondary | ICD-10-CM | POA: Insufficient documentation

## 2022-11-09 DIAGNOSIS — R338 Other retention of urine: Secondary | ICD-10-CM | POA: Diagnosis present

## 2022-11-09 DIAGNOSIS — I5042 Chronic combined systolic (congestive) and diastolic (congestive) heart failure: Secondary | ICD-10-CM | POA: Insufficient documentation

## 2022-11-09 DIAGNOSIS — E1142 Type 2 diabetes mellitus with diabetic polyneuropathy: Secondary | ICD-10-CM | POA: Diagnosis present

## 2022-11-09 DIAGNOSIS — R262 Difficulty in walking, not elsewhere classified: Secondary | ICD-10-CM | POA: Diagnosis not present

## 2022-11-09 LAB — CBC
HCT: 24.9 % — ABNORMAL LOW (ref 39.0–52.0)
Hemoglobin: 7.2 g/dL — ABNORMAL LOW (ref 13.0–17.0)
MCH: 23.3 pg — ABNORMAL LOW (ref 26.0–34.0)
MCHC: 28.9 g/dL — ABNORMAL LOW (ref 30.0–36.0)
MCV: 80.6 fL (ref 80.0–100.0)
Platelets: 187 10*3/uL (ref 150–400)
RBC: 3.09 MIL/uL — ABNORMAL LOW (ref 4.22–5.81)
RDW: 17 % — ABNORMAL HIGH (ref 11.5–15.5)
WBC: 4.4 10*3/uL (ref 4.0–10.5)
nRBC: 0.5 % — ABNORMAL HIGH (ref 0.0–0.2)

## 2022-11-09 LAB — BASIC METABOLIC PANEL
Anion gap: 8 (ref 5–15)
BUN: 12 mg/dL (ref 6–20)
CO2: 30 mmol/L (ref 22–32)
Calcium: 8.1 mg/dL — ABNORMAL LOW (ref 8.9–10.3)
Chloride: 99 mmol/L (ref 98–111)
Creatinine, Ser: 0.93 mg/dL (ref 0.61–1.24)
GFR, Estimated: >60 mL/min (ref >60)
Glucose, Bld: 224 mg/dL — ABNORMAL HIGH (ref 70–99)
Potassium: 4 mmol/L (ref 3.5–5.1)
Sodium: 137 mmol/L (ref 135–145)

## 2022-11-09 LAB — CBG MONITORING, ED: Glucose-Capillary: 209 mg/dL — ABNORMAL HIGH (ref 70–99)

## 2022-11-09 LAB — GLUCOSE, CAPILLARY
Glucose-Capillary: 179 mg/dL — ABNORMAL HIGH (ref 70–99)
Glucose-Capillary: 157 mg/dL — ABNORMAL HIGH (ref 70–99)

## 2022-11-09 MED ORDER — PREGABALIN 75 MG PO CAPS
150.0000 mg | ORAL_CAPSULE | Freq: Three times a day (TID) | ORAL | Status: DC
  Administered 2022-11-09 – 2022-11-12 (×11): 150 mg via ORAL
  Filled 2022-11-09 (×2): qty 2
  Filled 2022-11-09: qty 3
  Filled 2022-11-09 (×8): qty 2

## 2022-11-09 MED ORDER — LORATADINE 10 MG PO TABS
10.0000 mg | ORAL_TABLET | Freq: Every day | ORAL | Status: DC
  Administered 2022-11-09 – 2022-11-16 (×8): 10 mg via ORAL
  Filled 2022-11-09 (×8): qty 1

## 2022-11-09 MED ORDER — HYDROMORPHONE HCL 1 MG/ML IJ SOLN
0.5000 mg | Freq: Once | INTRAMUSCULAR | Status: AC
  Administered 2022-11-09: 0.5 mg via INTRAVENOUS
  Filled 2022-11-09: qty 0.5

## 2022-11-09 MED ORDER — INSULIN ASPART 100 UNIT/ML IJ SOLN
0.0000 [IU] | Freq: Three times a day (TID) | INTRAMUSCULAR | Status: DC
  Administered 2022-11-09: 2 [IU] via SUBCUTANEOUS
  Administered 2022-11-10: 3 [IU] via SUBCUTANEOUS
  Administered 2022-11-10: 2 [IU] via SUBCUTANEOUS
  Administered 2022-11-10: 3 [IU] via SUBCUTANEOUS
  Filled 2022-11-09 (×5): qty 1

## 2022-11-09 MED ORDER — DULOXETINE HCL 30 MG PO CPEP
30.0000 mg | ORAL_CAPSULE | Freq: Three times a day (TID) | ORAL | Status: DC
  Administered 2022-11-09 – 2022-11-16 (×21): 30 mg via ORAL
  Filled 2022-11-09 (×23): qty 1

## 2022-11-09 MED ORDER — ONDANSETRON HCL 4 MG/2ML IJ SOLN: 4.0000 mg | Freq: Four times a day (QID) | INTRAMUSCULAR | Status: DC | PRN

## 2022-11-09 MED ORDER — PANTOPRAZOLE SODIUM 40 MG PO TBEC
40.0000 mg | DELAYED_RELEASE_TABLET | Freq: Two times a day (BID) | ORAL | Status: DC
  Administered 2022-11-09 – 2022-11-16 (×15): 40 mg via ORAL
  Filled 2022-11-09 (×15): qty 1

## 2022-11-09 MED ORDER — SENNOSIDES-DOCUSATE SODIUM 8.6-50 MG PO TABS
2.0000 | ORAL_TABLET | Freq: Every day | ORAL | Status: DC | PRN
  Administered 2022-11-10: 2 via ORAL
  Filled 2022-11-09: qty 2

## 2022-11-09 MED ORDER — ATORVASTATIN CALCIUM 20 MG PO TABS
40.0000 mg | ORAL_TABLET | Freq: Every day | ORAL | Status: DC
  Administered 2022-11-09 – 2022-11-16 (×8): 40 mg via ORAL
  Filled 2022-11-09 (×8): qty 2

## 2022-11-09 MED ORDER — INSULIN GLARGINE-YFGN 100 UNIT/ML ~~LOC~~ SOLN
20.0000 [IU] | Freq: Every day | SUBCUTANEOUS | Status: DC
  Administered 2022-11-09 – 2022-11-15 (×7): 20 [IU] via SUBCUTANEOUS
  Filled 2022-11-09 (×8): qty 0.2

## 2022-11-09 MED ORDER — ONDANSETRON HCL 4 MG PO TABS: 4.0000 mg | ORAL_TABLET | Freq: Four times a day (QID) | ORAL | Status: DC | PRN

## 2022-11-09 MED ORDER — OXYCODONE HCL 5 MG PO TABS: 5.0000 mg | ORAL_TABLET | ORAL | Status: DC | PRN

## 2022-11-09 MED ORDER — TAMSULOSIN HCL 0.4 MG PO CAPS
0.4000 mg | ORAL_CAPSULE | Freq: Every day | ORAL | Status: DC
  Administered 2022-11-10 – 2022-11-16 (×7): 0.4 mg via ORAL
  Filled 2022-11-09 (×7): qty 1

## 2022-11-09 MED ORDER — BUPROPION HCL ER (XL) 150 MG PO TB24
150.0000 mg | ORAL_TABLET | Freq: Every day | ORAL | Status: DC
  Administered 2022-11-09 – 2022-11-16 (×8): 150 mg via ORAL
  Filled 2022-11-09 (×10): qty 1

## 2022-11-09 MED ORDER — FUROSEMIDE 40 MG PO TABS
40.0000 mg | ORAL_TABLET | Freq: Two times a day (BID) | ORAL | Status: DC
  Administered 2022-11-09 – 2022-11-16 (×14): 40 mg via ORAL
  Filled 2022-11-09 (×14): qty 1

## 2022-11-09 MED ORDER — OXYCODONE-ACETAMINOPHEN 5-325 MG PO TABS
1.0000 | ORAL_TABLET | Freq: Four times a day (QID) | ORAL | Status: DC | PRN
  Administered 2022-11-09 – 2022-11-11 (×6): 1 via ORAL
  Filled 2022-11-09 (×6): qty 1

## 2022-11-09 MED ORDER — HYDROCORTISONE ACETATE 25 MG RE SUPP
25.0000 mg | Freq: Two times a day (BID) | RECTAL | Status: DC
  Filled 2022-11-09: qty 1

## 2022-11-09 MED ORDER — HYDROCORTISONE ACETATE 25 MG RE SUPP
25.0000 mg | Freq: Four times a day (QID) | RECTAL | Status: DC
  Administered 2022-11-09 – 2022-11-15 (×6): 25 mg via RECTAL
  Filled 2022-11-09 (×31): qty 1

## 2022-11-09 MED ORDER — HYDROCORTISONE ACETATE 25 MG RE SUPP: 25.0000 mg | Freq: Two times a day (BID) | RECTAL | Status: DC

## 2022-11-09 NOTE — ED Triage Notes (Signed)
Pt sts that his " guts are falling out" Pt is very uncooperative in triage. 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  Pt later sts that he is just really here for facility placement.

## 2022-11-09 NOTE — H&P (Signed)
History and Physical    Russell [name] ZOX:[id number] DOB: 06/08/1965 DOA: 11/09/2022  PCP: [name], MD (Confirm with patient/family/NH records and if not entered, this has to be entered at [facility] point of entry) Patient coming from: Home  I have personally briefly reviewed patient's old medical records in [facility] Link  Chief Complaint: I need help  HPI: Russell [name] is a 57 y.o. male with medical history significant of recent acute blood loss anemia secondary to upper GI bleed status post EGD and PRBC, chronic combined HFrEF and HFpEF, right BKA, severe external hemorrhoid, IIDM, HTN, HLD, OSA to start home O2, came back from home for multiple complaints including worsening of ambulation impairment.  Patient was just recently hospitalized for recurrent upper GI bleed underwent EGD and PRBC x 4, after which his hemoglobin stabilized in the 7.0-7.5 range.  Patient was also treated for severe internal/external hemorrhoid with rectal prolapse with Anusol cream with some improvement and patient is scheduled to see colorectal surgeon in [facility] Dr. [name] next month.  During hospital stay, PT evaluation recommended patient going to SNF for physical therapy however patient declined.  Yesterday patient went home and due to his chronic ambulation abnormalities he was unable to fill his medication including the Anulso cream, home oxygen for newly diagnosed OSA" cannot do anything without help" denies any fall, but does complain about worsening of rectal pain from the hemorrhoid.  And he decided to come back to ED this morning.  ED Course: Afebrile, none hypotensive none tachycardia.  Hemoglobin 7.2 compared to baseline 7.0-7.5 last week.    Review of Systems: As per HPI otherwise 14 point review of systems negative.    Past Medical History:  Diagnosis Date   Anxiety    CHF (congestive heart failure) (HCC)    EF 30-35%   Chronic diastolic (congestive) heart failure (HCC)     Depression    Diabetes mellitus without complication (HCC)    Hyperlipemia    Hypertension    Morbid obesity (HCC)    Peripheral edema     Past Surgical History:  Procedure Laterality Date   APPENDECTOMY     BELOW KNEE LEG AMPUTATION     Right leg   BIOPSY  10/14/2022   Procedure: BIOPSY;  Surgeon: Norma Fredrickson, Boykin Nearing, MD;  Location: [facility] ENDOSCOPY;  Service: Gastroenterology;;   COLONOSCOPY N/A 10/16/2022   Procedure: COLONOSCOPY;  Surgeon: [name], MD;  Location: [facility] ENDOSCOPY;  Service: Gastroenterology;  Laterality: N/A;   ESOPHAGOGASTRODUODENOSCOPY (EGD) WITH PROPOFOL N/A 10/14/2022   Procedure: ESOPHAGOGASTRODUODENOSCOPY (EGD) WITH PROPOFOL;  Surgeon: Toledo, Boykin Nearing, MD;  Location: [facility] ENDOSCOPY;  Service: Gastroenterology;  Laterality: N/A;   ESOPHAGOGASTRODUODENOSCOPY (EGD) WITH PROPOFOL N/A 10/26/2022   Procedure: ESOPHAGOGASTRODUODENOSCOPY (EGD) WITH PROPOFOL;  Surgeon: Regis Bill, MD;  Location: [facility] ENDOSCOPY;  Service: Endoscopy;  Laterality: N/A;   TONSILLECTOMY       reports that he has never smoked. He has never used smokeless tobacco. He reports that he does not drink alcohol and does not use drugs.  Allergies  Allergen Reactions   Fluoxetine     Other reaction(s): Hallucination   Shellfish Allergy Hives   Sulfa Antibiotics Hives   Vancomycin     Other reaction(s): Red Man Syndrome   Metformin Nausea Only    Family History  Problem Relation Age of Onset   COPD Mother    Diabetes Father    CAD Father    Kidney cancer Brother  Prior to Admission medications   Medication Sig Start Date End Date Taking? Authorizing Provider  atorvastatin (LIPITOR) 40 MG tablet Take 40 mg by mouth daily.     [provider]  buPROPion (WELLBUTRIN XL) 150 MG 24 hr tablet Take 1 tablet (150 mg total) by mouth daily. 11/06/22 12/06/22  Charise Killian, MD  cetirizine (ZYRTEC) 10 MG tablet Take 10 mg by mouth daily as needed for allergies. 06/23/20    [provider]  collagenase (SANTYL) ointment Apply topically daily. Patient not taking: Reported on 10/24/2022 02/02/18   Adrian Saran, MD  DULoxetine HCl 30 MG CSDR Take 30 mg by mouth in the morning, at noon, and at bedtime. 10/19/22   Alford Highland, MD  furosemide (LASIX) 40 MG tablet Take 1 tablet (40 mg total) by mouth 2 (two) times daily. 10/19/22   Alford Highland, MD  HUMULIN R 100 UNIT/ML injection Inject 0.15 mLs (15 Units total) into the skin 3 (three) times daily before meals. 10/19/22   Alford Highland, MD  hydrocortisone (ANUSOL-HC) 2.5 % rectal cream Place rectally 4 (four) times daily. 11/06/22   Enedina Finner, MD  hydrocortisone (ANUSOL-HC) 25 MG suppository Place 1 suppository (25 mg total) rectally 2 (two) times daily for 14 days. 11/05/22 11/19/22  Charise Killian, MD  insulin detemir (LEVEMIR) 100 UNIT/ML FlexPen Inject 25 Units into the skin 2 (two) times daily. 11/08/22   Enedina Finner, MD  Insulin Pen Needle 33G X 5 MM MISC 1 Dose by Does not apply route 2 (two) times daily. 10/19/22   Alford Highland, MD  oxyCODONE-acetaminophen (PERCOCET/ROXICET) 5-325 MG tablet Take 1 tablet by mouth every 6 (six) hours as needed for severe pain. 10/19/22   Alford Highland, MD  pantoprazole (PROTONIX) 40 MG tablet Take 1 tablet (40 mg total) by mouth 2 (two) times daily. 11/08/22   Enedina Finner, MD  potassium chloride SA (K-DUR,KLOR-CON) 20 MEQ tablet Take 20 mEq by mouth 2 (two) times daily as needed (cramps). 11/08/17   [provider]  pregabalin (LYRICA) 150 MG capsule Take 150 mg by mouth 3 (three) times daily.     [provider]  senna-docusate (SENOKOT-S) 8.6-50 MG tablet Take 2 tablets by mouth daily as needed for mild constipation.    [provider]  tamsulosin (FLOMAX) 0.4 MG CAPS capsule Take 1 capsule (0.4 mg total) by mouth daily after breakfast. 10/19/22   Alford Highland, MD    Physical Exam: Vitals:   11/09/22 1347 11/09/22 1348  BP: 137/67    Pulse: 74   Resp: 19   Temp: 98.4 F (36.9 C)   TempSrc: Oral   SpO2: 97%   Weight:  (!) 176.9 kg  Height:  5\' 11"  (1.803 m)    Constitutional: NAD, calm, comfortable Vitals:   11/09/22 1347 11/09/22 1348  BP: 137/67   Pulse: 74   Resp: 19   Temp: 98.4 F (36.9 C)   TempSrc: Oral   SpO2: 97%   Weight:  (!) 176.9 kg  Height:  5\' 11"  (1.803 m)   Eyes: PERRL, lids and conjunctivae normal ENMT: Mucous membranes are moist. Posterior pharynx clear of any exudate or lesions.Normal dentition.  Neck: normal, supple, no masses, no thyromegaly Respiratory: clear to auscultation bilaterally, no wheezing, no crackles. Normal respiratory effort. No accessory muscle use.  Cardiovascular: Regular rate and rhythm, no murmurs / rubs / gallops. No extremity edema. 2+ pedal pulses. No carotid bruits.  Abdomen: no tenderness, no masses palpated. No hepatosplenomegaly.  Bowel sounds positive.  Musculoskeletal: no clubbing / cyanosis. No joint deformity upper and lower extremities. Good ROM, no contractures. Normal muscle tone.  Skin: Chronic venous stasis skin changes on right side with left BKA chronic Neurologic: CN 2-12 grossly intact. Sensation intact, DTR normal. Strength 5/5 in all 4.  Psychiatric: Normal judgment and insight. Alert and oriented x 3. Normal mood.    Labs on Admission: I have personally reviewed following labs and imaging studies  CBC: Recent Labs  Lab 11/03/22 0544 11/04/22 0529 11/05/22 0527 11/06/22 0501 11/09/22 1426  WBC  --  5.5 4.9 4.6 4.4  HGB 7.8* 7.2* 7.7* 7.0* 7.2*  HCT 25.8* 23.8* 25.9* 24.2* 24.9*  MCV  --  82.4 83.3 83.4 80.6  PLT  --  172 180 165 187   Basic Metabolic Panel: Recent Labs  Lab 11/03/22 0544 11/04/22 0529 11/05/22 0527 11/06/22 0501 11/09/22 1426  NA 139 136 138 138 137  K 3.9 4.0 3.9 3.5 4.0  CL 102 101 102 100 99  CO2 29 28 30  32 30  GLUCOSE 81 141* 158* 122* 224*  BUN 15 15 13 9 12   CREATININE 0.67 0.87 0.78 0.73 0.93   CALCIUM 7.9* 7.4* 7.7* 7.8* 8.1*   GFR: Estimated Creatinine Clearance: 141.9 mL/min (by C-G formula based on SCr of 0.93 mg/dL). Liver Function Tests: No results for input(s): "AST", "ALT", "ALKPHOS", "BILITOT", "PROT", "ALBUMIN" in the last 168 hours. No results for input(s): "LIPASE", "AMYLASE" in the last 168 hours. No results for input(s): "AMMONIA" in the last 168 hours. Coagulation Profile: No results for input(s): "INR", "PROTIME" in the last 168 hours. Cardiac Enzymes: No results for input(s): "CKTOTAL", "CKMB", "CKMBINDEX", "TROPONINI" in the last 168 hours. BNP (last 3 results) No results for input(s): "PROBNP" in the last 8760 hours. HbA1C: No results for input(s): "HGBA1C" in the last 72 hours. CBG: Recent Labs  Lab 11/07/22 1222 11/07/22 1706 11/07/22 2133 11/08/22 0824 11/08/22 1206  GLUCAP 137* 165* 182* 140* 165*   Lipid Profile: No results for input(s): "CHOL", "HDL", "LDLCALC", "TRIG", "CHOLHDL", "LDLDIRECT" in the last 72 hours. Thyroid Function Tests: No results for input(s): "TSH", "T4TOTAL", "FREET4", "T3FREE", "THYROIDAB" in the last 72 hours. Anemia Panel: No results for input(s): "VITAMINB12", "FOLATE", "FERRITIN", "TIBC", "IRON", "RETICCTPCT" in the last 72 hours. Urine analysis:    Component Value Date/Time   COLORURINE YELLOW (A) 11/20/2016 1307   APPEARANCEUR CLEAR (A) 11/20/2016 1307   APPEARANCEUR Clear 08/13/2013 1624   LABSPEC 1.022 11/20/2016 1307   LABSPEC 1.012 08/13/2013 1624   PHURINE 6.0 11/20/2016 1307   GLUCOSEU >=500 (A) 11/20/2016 1307   GLUCOSEU 150 mg/dL 36/64/4034 7425   HGBUR SMALL (A) 11/20/2016 1307   BILIRUBINUR NEGATIVE 11/20/2016 1307   BILIRUBINUR Negative 08/13/2013 1624   KETONESUR 5 (A) 11/20/2016 1307   PROTEINUR NEGATIVE 11/20/2016 1307   NITRITE NEGATIVE 11/20/2016 1307   LEUKOCYTESUR NEGATIVE 11/20/2016 1307   LEUKOCYTESUR Negative 08/13/2013 1624    Radiological Exams on Admission: No results  found.  EKG: None  Assessment/Plan Principal Problem:   Impaired ambulation Active Problems:   Morbid obesity (HCC)   Generalized weakness  (please populate well all problems here in Problem List. (For example, if patient is on BP meds at home and you resume or decide to hold them, it is a problem that needs to be her. Same for CAD, COPD, HLD and so on)  Acute on chronic ambulation impairment -At this time, patient agreed with SNF placement -Reconsult PT  and reconsult case management  Recent acute blood loss anemia secondary to recurrent upper GI bleed -No acute concern H&H stable -Continue PPI  Worsening of rectal pain -Secondary to uncontrolled internal/external hemorrhoid with rectal prolapse -Continue Anusol cream 4 times a day -As needed oxycodone -Patient aware about staying away from NSAIDs given the recent repeated GI bleed -Scheduled to see colorectal specialist Dr. Maisie Fus October 8 in [place]  IDDM -SSI for now  PAD with Hx of right BKA -No acute concern, continue to hold aspirin and Plavix according to GIs recommendation  Chronic combined HFrEF and HFpEF -Euvolemic, stable creatinine level, continue Lasix twice daily  BPH -Continue Flomax  Morbid obesity -BMI= 54, outpatient bariatric evaluation  OSA -Nocturnal O2 -Case management to arrange home O2 delivery   DVT prophylaxis: TED hose Code Status: Full code Family Communication: None at bedside Disposition Plan: Expect less than 2 midnight hospital stay Consults called: None Admission status: MedSurg observation   Emeline General MD [facility] Pager [phone]  11/09/2022, 3:44 PM

## 2022-11-09 NOTE — Inpatient Diabetes Management (Signed)
Inpatient Diabetes Program Recommendations  AACE/ADA: New Consensus Statement on Inpatient Glycemic Control   Target Ranges:  Prepandial:   less than 140 mg/dL      Peak postprandial:   less than 180 mg/dL (1-2 hours)      Critically ill patients:  140 - 180 mg/dL    Latest Reference Range & Units 11/07/22 07:53 11/07/22 12:22 11/07/22 17:06 11/07/22 21:33 11/08/22 08:24 11/08/22 12:06  Glucose-Capillary 70 - 99 mg/dL 098 (H) 119 (H) 147 (H) 182 (H) 140 (H) 165 (H)   Review of Glycemic Control  Diabetes history: DM2 Outpatient Diabetes medications: Levemir 25 units BID, Regular 15 units TID with meals Current orders for Inpatient glycemic control: None; in ED for placement  Inpatient Diabetes Program Recommendations:    Insulin: Please consider ordering Semglee 25 units at bedtime, CBGs AC&HS, and Novolog 0-9 units AC&HS.  NOTE: Patient was just inpatient 10/24/22-11/08/22 and had declined rehab so he was discharged home on 11/08/22. Patient is currently in ED and is requesting placement into care facility.  Noted consult for assistance with orders for glycemic control.   Thanks, Orlando Penner, RN, MSN, CDCES Diabetes Coordinator Inpatient Diabetes Program [phone] (Team Pager from 8am to 5pm)

## 2022-11-09 NOTE — ED Provider Notes (Signed)
[facility] Provider Note    Event Date/Time   First MD Initiated Contact with Patient 11/09/22 1417     (approximate)   History   Abdominal Pain   HPI  Russell [name] is a 57 y.o. male returns today reporting that he needs placement into a care facility.  He is continuing to struggle with intermittent pain in the rectum that comes and goes and also "sciatica" that runs down the back of his buttock towards his right amputated limb.  This is a ongoing issue  He reports last night he got home, he has oxygen at the house, home health does not arrive, and he continues to struggle he cannot see or identify his pill bottles due to blindness, he cannot arrange his own oxygen, he made himself a sandwich but is otherwise been at home and discomfort  Patient requesting to be at admitted back to the hospital    Past medical history including discharge from August 24 noted as "Russell [name] is a 57 y.o. Caucasian male with medical history significant for combined systolic and diastolic CHF, right BKA, anxiety and depression, type 2 diabetes mellitus, hypertension, dyslipidemia and morbid obesity, who presented to the emergency room with acute onset of dyspnea.  The patient was just admitted from 8/13 till 8/24 acute blood loss anemia with associated upper GI bleeding and hypovolemic shock.  His EGD showed gastritis and nonbleeding gastric ulcers.  His colonoscopy showed no active bleeding.  He received a total of 4 units of PRBCs during his hospitalization as well as IV iron.  His hemoglobin was 8.3 on 8/18.  He required a brief IV Levophed for his hypovolemic shock. "  During his last hospitalization for which she left yesterday he had repeat endoscopy.  Showing healed gastric ulcer.  He also has known internal and external hemorrhoids causing intermittent pain and discomfort  Physical Exam   Triage Vital Signs: ED Triage Vitals  Encounter Vitals Group     BP 11/09/22  1347 137/67     Systolic BP Percentile --      Diastolic BP Percentile --      Pulse Rate 11/09/22 1347 74     Resp 11/09/22 1347 19     Temp 11/09/22 1347 98.4 F (36.9 C)     Temp Source 11/09/22 1347 Oral     SpO2 11/09/22 1347 97 %     Weight 11/09/22 1348 (!) 390 lb (176.9 kg)     Height 11/09/22 1348 5\' 11"  (1.803 m)     Head Circumference --      Peak Flow --      Pain Score 11/09/22 1348 10     Pain Loc --      Pain Education --      Exclude from Growth Chart --     Most recent vital signs: Vitals:   11/09/22 1347  BP: 137/67  Pulse: 74  Resp: 19  Temp: 98.4 F (36.9 C)  SpO2: 97%     General: Awake, no distress.  Laying on his left side on 2 L nasal cannula respirated without distress.  Alert. CV:  Good peripheral perfusion.  Normal tones Resp:  Normal effort.  Clear bilateral Abd:  No distention.  Obese but soft nontender nondistended Other:  Right amputation site clean dry intact.  Patient reports he gets shooting pains from it up towards his leg and back which she describes as "sciatica.  Appears warm and well-perfused.  ED Results / Procedures / Treatments   Labs (all labs ordered are listed, but only abnormal results are displayed) Labs Reviewed  CBC - Abnormal; Notable for the following components:      Result Value   RBC 3.09 (*)    Hemoglobin 7.2 (*)    HCT 24.9 (*)    MCH 23.3 (*)    MCHC 28.9 (*)    RDW 17.0 (*)    nRBC 0.5 (*)    All other components within normal limits  BASIC METABOLIC PANEL - Abnormal; Notable for the following components:   Glucose, Bld 224 (*)    Calcium 8.1 (*)    All other components within normal limits     EKG     RADIOLOGY     PROCEDURES:  Critical Care performed: No  Procedures   MEDICATIONS ORDERED IN ED: Medications  atorvastatin (LIPITOR) tablet 40 mg (has no administration in time range)  buPROPion (WELLBUTRIN XL) 24 hr tablet 150 mg (has no administration in time range)  loratadine  (CLARITIN) tablet 10 mg (has no administration in time range)  furosemide (LASIX) tablet 40 mg (has no administration in time range)  hydrocortisone (ANUSOL-HC) suppository 25 mg (has no administration in time range)  oxyCODONE-acetaminophen (PERCOCET/ROXICET) 5-325 MG per tablet 1 tablet (has no administration in time range)  pantoprazole (PROTONIX) EC tablet 40 mg (has no administration in time range)  pregabalin (LYRICA) capsule 150 mg (has no administration in time range)  senna-docusate (Senokot-S) tablet 2 tablet (has no administration in time range)  tamsulosin (FLOMAX) capsule 0.4 mg (has no administration in time range)  DULoxetine (CYMBALTA) DR capsule 30 mg (has no administration in time range)  insulin glargine-yfgn (SEMGLEE) injection 20 Units (has no administration in time range)  insulin aspart (novoLOG) injection 0-9 Units (has no administration in time range)     IMPRESSION / MDM / ASSESSMENT AND PLAN / ED COURSE  I reviewed the triage vital signs and the nursing notes.                              Differential diagnosis includes, but is not limited to, ongoing social concerns, request for readmission to the hospital replacement, additionally the patient has multiple no more comorbidities.  He has not been able to take any of his medications because he cannot identify the bottles or find them with his blindness.  I will reorder many of his prescribed medications, placed a request to the hospitalist service to have the patient reevaluated to be readmitted for ongoing care needs, consideration for placement, and further medical care as needed... It is noted and has been less than 24 hours since he was discharged   Patient's presentation is most consistent with acute, uncomplicated illness.  CBC, metabolic panel reviewed, no acute findings.  Mild hyperglycemia  ----------------------------------------- 3:27 PM on 11/09/2022 ----------------------------------------- Given  the patient's ongoing and significant debility, ongoing issue of intermittent rectal discomfort, inability care for self etc. I consult with the hospitalist.  Given his recent and proximal admission including discharge yesterday evening, current plan is for hospitalist to readmit under the care of Dr. Chipper Herb  FINAL CLINICAL IMPRESSION(S) / ED DIAGNOSES   Final diagnoses:  Rectal pain  Ambulatory dysfunction     Rx / DC Orders   ED Discharge Orders     None        Note:  This document was prepared using Dragon voice recognition  software and may include unintentional dictation errors.   Sharyn Creamer, MD 11/09/22 [phone]

## 2022-11-09 NOTE — ED Notes (Signed)
While medicating patient per [facility], patient states "I need a shot, I'm hurting too bad". Patient made aware that Dr. Fanny Bien has only ordered Oxycodone q 6. Patient verbalized understanding.

## 2022-11-09 NOTE — ED Notes (Signed)
Patient called out for pain medications.

## 2022-11-10 ENCOUNTER — Ambulatory Visit: Payer: 59 | Admitting: Urology

## 2022-11-10 DIAGNOSIS — E1142 Type 2 diabetes mellitus with diabetic polyneuropathy: Secondary | ICD-10-CM | POA: Diagnosis not present

## 2022-11-10 DIAGNOSIS — R338 Other retention of urine: Secondary | ICD-10-CM

## 2022-11-10 DIAGNOSIS — I739 Peripheral vascular disease, unspecified: Secondary | ICD-10-CM | POA: Diagnosis present

## 2022-11-10 DIAGNOSIS — R531 Weakness: Secondary | ICD-10-CM

## 2022-11-10 DIAGNOSIS — D62 Acute posthemorrhagic anemia: Secondary | ICD-10-CM

## 2022-11-10 DIAGNOSIS — R262 Difficulty in walking, not elsewhere classified: Secondary | ICD-10-CM | POA: Diagnosis not present

## 2022-11-10 LAB — GLUCOSE, CAPILLARY
Glucose-Capillary: 194 mg/dL — ABNORMAL HIGH (ref 70–99)
Glucose-Capillary: 220 mg/dL — ABNORMAL HIGH (ref 70–99)
Glucose-Capillary: 220 mg/dL — ABNORMAL HIGH (ref 70–99)
Glucose-Capillary: 264 mg/dL — ABNORMAL HIGH (ref 70–99)

## 2022-11-10 MED ORDER — POLYETHYLENE GLYCOL 3350 17 G PO PACK
17.0000 g | PACK | Freq: Every day | ORAL | Status: DC
  Administered 2022-11-10 – 2022-11-13 (×3): 17 g via ORAL
  Filled 2022-11-10 (×6): qty 1

## 2022-11-10 MED ORDER — ALPRAZOLAM 0.25 MG PO TABS
0.2500 mg | ORAL_TABLET | Freq: Once | ORAL | Status: AC
  Administered 2022-11-10: 0.25 mg via ORAL
  Filled 2022-11-10: qty 1

## 2022-11-10 MED ORDER — MORPHINE SULFATE (PF) 2 MG/ML IV SOLN
2.0000 mg | INTRAVENOUS | Status: DC | PRN
  Administered 2022-11-10: 2 mg via INTRAVENOUS
  Filled 2022-11-10: qty 1

## 2022-11-10 MED ORDER — FE FUM-VIT C-VIT B12-FA 460-60-0.01-1 MG PO CAPS
1.0000 | ORAL_CAPSULE | Freq: Two times a day (BID) | ORAL | Status: DC
  Administered 2022-11-10 – 2022-11-16 (×13): 1 via ORAL
  Filled 2022-11-10 (×14): qty 1

## 2022-11-10 MED ORDER — KETOROLAC TROMETHAMINE 30 MG/ML IJ SOLN
30.0000 mg | Freq: Three times a day (TID) | INTRAMUSCULAR | Status: DC | PRN
  Administered 2022-11-10 – 2022-11-15 (×12): 30 mg via INTRAVENOUS
  Filled 2022-11-10 (×12): qty 1

## 2022-11-10 NOTE — Assessment & Plan Note (Signed)
Generalized weakness. TOC is working on placement as recommended during prior hospitalization. Patient is now agreeable to go to a rehab.

## 2022-11-10 NOTE — Hospital Course (Addendum)
Taken from H&P.  Russell [name] is a 57 y.o. male with medical history significant of recent acute blood loss anemia secondary to upper GI bleed status post EGD and PRBC, chronic combined HFrEF and HFpEF, right BKA, severe external hemorrhoid, IIDM, HTN, HLD, OSA to start home O2, came back from home for multiple complaints including worsening of ambulation impairment.   Patient was just recently hospitalized during 8/24 till 11/08/2022 for recurrent upper GI bleed underwent EGD and PRBC x 4, after which his hemoglobin stabilized in the 7.0-7.5 range.  Patient was also treated for severe internal/external hemorrhoid with rectal prolapse with Anusol cream with some improvement and patient is scheduled to see colorectal surgeon in [facility] Dr. [name] next month.  During hospital stay, PT evaluation recommended patient going to SNF for physical therapy however patient declined.    On presentation patient was having stable vitals and labs.  9/11: Vital stable.  TOC is working on placement as patient is unable to take care of himself.  9/12: Vital stable, CBG remained elevated, making sliding scale moderate and adding 4 units with meal.  Pending SNF placement.  Keep asking for IV opioids which we will currently avoid, added IV Toradol and increasing the dose of Percocet.

## 2022-11-10 NOTE — Assessment & Plan Note (Signed)
Patient was discharged with Foley catheter in place and need to have a outpatient urology evaluation for a voiding trial. Outpatient follow-up appointment was made.

## 2022-11-10 NOTE — Assessment & Plan Note (Signed)
Estimated body mass index is 53.84 kg/m as calculated from the following:   Height as of this encounter: 5\' 11"  (1.803 m).   Weight as of this encounter: 175.1 kg.   -This will complicate overall prognosis.

## 2022-11-10 NOTE — [place] FL2 (Signed)
[place] MEDICAID FL2 LEVEL OF CARE FORM     IDENTIFICATION  Patient Name: Russell [name] Birthdate: 12/17/1965 Sex: male Admission Date (Current Location): 11/09/2022  [facility] and [place] Number:  Chiropodist and Address:  [facility], [address], [place], [place] [address]      Provider Number: [id number]  Attending Physician Name and Address:  Arnetha Courser, MD  Relative Name and Phone Number:  Pinchus, Dischler (Spouse)  [phone]    Current Level of Care: Hospital Recommended Level of Care: Skilled Nursing Facility Prior Approval Number:    Date Approved/Denied:   PASRR Number: [id number]  Discharge Plan: SNF    Current Diagnoses: Patient Active Problem List   Diagnosis Date Noted   Generalized weakness 11/09/2022   Impaired ambulation 11/09/2022   Residual hemorrhoidal skin tags 11/06/2022   Acute gastritis 11/06/2022   Major depressive disorder, recurrent episode, moderate (HCC) 10/28/2022   Dyslipidemia 10/24/2022   Anxiety and depression 10/24/2022   Essential hypertension 10/24/2022   Type 2 diabetes mellitus with peripheral neuropathy (HCC) 10/24/2022   Acute gastric ulcer with hemorrhage 10/24/2022   Acute urinary retention 10/17/2022   Acute respiratory failure with hypoxia (HCC) 10/15/2022   Multiple gastric ulcers 10/14/2022   Hyperosmolar hyperglycemic state (HHS) (HCC) 10/13/2022   HLD (hyperlipidemia) 10/13/2022   Acute on chronic diastolic CHF (congestive heart failure) (HCC) 10/13/2022   Depression with anxiety 10/13/2022   Hyperkalemia 10/13/2022   Elevated lactic acid level 10/13/2022   Rectal bleeding 10/13/2022   Acute blood loss anemia 10/13/2022   Wound infection_ left lower leg 10/13/2022   Upper GI bleed 10/13/2022   Hypovolemic shock (HCC) 10/13/2022   Epigastric abdominal pain 10/13/2022   Sciatica of right side 10/13/2022   AKI (acute kidney injury) (HCC) 10/13/2022   Hypotension  11/21/2020   Atrial flutter with rapid ventricular response (HCC) 11/21/2020   Chronic kidney disease, stage 4, severely decreased GFR (HCC) 06/14/2018   Chronic venous insufficiency 06/14/2018   Cellulitis of left lower extremity 02/01/2018   Cellulitis 05/18/2017   Hyperlipidemia, mixed 01/10/2017   Cellulitis and abscess of left leg 08/08/2016   Diabetes with ulcer of leg (HCC) 08/08/2016   Hyperglycemia 08/08/2016   Morbid obesity (HCC) 08/08/2016   Cellulitis and abscess of leg 08/08/2016   Adjustment disorder with depressed mood 11/21/2014   Other social stressor 11/21/2014   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   Diabetes (HCC) 11/21/2014    Orientation RESPIRATION BLADDER Height & Weight     Self, Time, Situation, Place  O2 Continent Weight: (!) 386 lb 0.4 oz (175.1 kg) Height:  5\' 11"  (180.3 cm)  BEHAVIORAL SYMPTOMS/MOOD NEUROLOGICAL BOWEL NUTRITION STATUS      Continent    AMBULATORY STATUS COMMUNICATION OF NEEDS Skin   Extensive Assist Verbally  (wound on tip of left toe)                       Personal Care Assistance Level of Assistance  Bathing, Feeding, Dressing Bathing Assistance: Maximum assistance Feeding assistance: Limited assistance Dressing Assistance: Maximum assistance     Functional Limitations Info  Sight, Hearing, Speech Sight Info: Adequate Hearing Info: Adequate Speech Info: Adequate    SPECIAL CARE FACTORS FREQUENCY  OT (By licensed OT), PT (By licensed PT)     PT Frequency: 5 times a week OT Frequency: 5 times a week            Contractures Contractures Info:  Not present    Additional Factors Info  Code Status Code Status Info: FULL Allergies Info: Fluoxetine  Shellfish Allergy  Sulfa Antibiotics  Vancomycin  Metformin           Current Medications (11/10/2022):  This is the current hospital active medication list Current Facility-Administered Medications  Medication Dose Route Frequency Provider Last Rate  Last Admin   atorvastatin (LIPITOR) tablet 40 mg  40 mg Oral Daily Sharyn Creamer, MD   40 mg at 11/10/22 0827   buPROPion (WELLBUTRIN XL) 24 hr tablet 150 mg  150 mg Oral Daily Sharyn Creamer, MD   150 mg at 11/10/22 0829   DULoxetine (CYMBALTA) DR capsule 30 mg  30 mg Oral TID Sharyn Creamer, MD   30 mg at 11/10/22 0829   Fe Fum-Vit C-Vit B12-FA (TRIGELS-F FORTE) capsule 1 capsule  1 capsule Oral BID Arnetha Courser, MD       furosemide (LASIX) tablet 40 mg  40 mg Oral BID Sharyn Creamer, MD   40 mg at 11/10/22 0828   hydrocortisone (ANUSOL-HC) suppository 25 mg  25 mg Rectal Q6H Mikey College T, MD   25 mg at 11/10/22 0500   insulin aspart (novoLOG) injection 0-9 Units  0-9 Units Subcutaneous TID WC Sharyn Creamer, MD   2 Units at 11/10/22 0832   insulin glargine-yfgn (SEMGLEE) injection 20 Units  20 Units Subcutaneous QHS Sharyn Creamer, MD   20 Units at 11/09/22 2156   loratadine (CLARITIN) tablet 10 mg  10 mg Oral Daily Sharyn Creamer, MD   10 mg at 11/10/22 0829   ondansetron (ZOFRAN) tablet 4 mg  4 mg Oral Q6H PRN Mikey College T, MD       Or   ondansetron [facility]) injection 4 mg  4 mg Intravenous Q6H PRN Mikey College T, MD       oxyCODONE-acetaminophen (PERCOCET/ROXICET) 5-325 MG per tablet 1 tablet  1 tablet Oral Q6H PRN Sharyn Creamer, MD   1 tablet at 11/10/22 0828   pantoprazole (PROTONIX) EC tablet 40 mg  40 mg Oral BID Sharyn Creamer, MD   40 mg at 11/10/22 0828   polyethylene glycol (MIRALAX / GLYCOLAX) packet 17 g  17 g Oral Daily Arnetha Courser, MD       pregabalin (LYRICA) capsule 150 mg  150 mg Oral TID Sharyn Creamer, MD   150 mg at 11/10/22 0829   senna-docusate (Senokot-S) tablet 2 tablet  2 tablet Oral Daily PRN Sharyn Creamer, MD   2 tablet at 11/10/22 0834   tamsulosin (FLOMAX) capsule 0.4 mg  0.4 mg Oral QPC breakfast Sharyn Creamer, MD   0.4 mg at 11/10/22 8295     Discharge Medications: Please see discharge summary for a list of discharge medications.  Relevant Imaging Results:  Relevant Lab  Results:   Additional Information SS  [id number]  Allena Katz, LCSW

## 2022-11-10 NOTE — Progress Notes (Signed)
Progress Note   Patient: Russell [name] ZOX:[id number] DOB: Jan 08, 1966 DOA: 11/09/2022     0 DOS: the patient was seen and examined on 11/10/2022   Brief hospital course: Taken from H&P.  Russell [name] is a 57 y.o. male with medical history significant of recent acute blood loss anemia secondary to upper GI bleed status post EGD and PRBC, chronic combined HFrEF and HFpEF, right BKA, severe external hemorrhoid, IIDM, HTN, HLD, OSA to start home O2, came back from home for multiple complaints including worsening of ambulation impairment.   Patient was just recently hospitalized during 8/24 till 11/08/2022 for recurrent upper GI bleed underwent EGD and PRBC x 4, after which his hemoglobin stabilized in the 7.0-7.5 range.  Patient was also treated for severe internal/external hemorrhoid with rectal prolapse with Anusol cream with some improvement and patient is scheduled to see colorectal surgeon in [facility] Dr. [name] next month.  During hospital stay, PT evaluation recommended patient going to SNF for physical therapy however patient declined.    On presentation patient was having stable vitals and labs.  9/11: Vital stable.  TOC is working on placement as patient is unable to take care of himself.  Assessment and Plan: * Impaired ambulation Generalized weakness. TOC is working on placement as recommended during prior hospitalization. Patient is now agreeable to go to a rehab.  Type 2 diabetes mellitus with peripheral neuropathy (HCC) CBG elevated. -Continue with Semglee and SSI -Continue Lyrica  Acute urinary retention Patient was discharged with Foley catheter in place and need to have a outpatient urology evaluation for a voiding trial. Outpatient follow-up appointment was made.  Acute blood loss anemia Recent GI bleed, hemoglobin currently stable at 7.1 Aspirin and Plavix are on hold.  No recurrence. -Adding supplement -Outpatient follow-up which was scheduled during most  recent hospitalization  Morbid obesity (HCC) Estimated body mass index is 53.84 kg/m as calculated from the following:   Height as of this encounter: 5\' 11"  (1.803 m).   Weight as of this encounter: 175.1 kg.   -This will complicate overall prognosis.  PAD (peripheral artery disease) (HCC) S/p right BKA. -Home aspirin and Plavix are on hold due to recent GI bleed.   Subjective: Patient was seen and examined today.  Per patient he cannot take care of himself and would like to go to rehab now.  Physical Exam: Vitals:   11/09/22 1805 11/09/22 2142 11/10/22 0436 11/10/22 0740  BP: (!) 123/57 136/66 (!) 140/70 139/79  Pulse: 71 72 78 78  Resp: 18 18 20 19   Temp:  98.1 F (36.7 C) 97.8 F (36.6 C) 97.6 F (36.4 C)  TempSrc:  Oral Oral Oral  SpO2: 95% 93% 93% 95%  Weight: (!) 175.1 kg     Height: 5\' 11"  (1.803 m)      General.  Morbidly obese gentleman, in no acute distress. Pulmonary.  Lungs clear bilaterally, normal respiratory effort. CV.  Regular rate and rhythm, no JVD, rub or murmur. Abdomen.  Soft, nontender, nondistended, BS positive. CNS.  Alert and oriented .  No focal neurologic deficit. Extremities.  Right BKA, no edema on left. Psychiatry.  Judgment and insight appears normal.   Data Reviewed: Prior data reviewed  Family Communication: Discussed with patient  Disposition: Status is: Observation The patient remains OBS appropriate and will d/c before 2 midnights.  Planned Discharge Destination: Skilled nursing facility  Time spent: 40 minutes  This record has been created using Conservation officer, historic buildings. Errors have  been sought and corrected,but may not always be located. Such creation errors do not reflect on the standard of care.   Author: Arnetha Courser, MD 11/10/2022 1:03 PM  For on call review [email or web address].

## 2022-11-10 NOTE — Assessment & Plan Note (Signed)
S/p right BKA. -Home aspirin and Plavix are on hold due to recent GI bleed.

## 2022-11-10 NOTE — Evaluation (Signed)
Physical Therapy Evaluation Patient Details Name: Russell [name] MRN: [id number] DOB: [birth year] Today's Date: 11/10/2022  History of Present Illness  Pt is a 57 year old male presenting to ED with multiple complaints, pt reports he is unable to care for himself at home.  PMH significant for recent acute blood loss anemia secondary to upper GI bleed status post EGD and PRBC, chronic combined HFrEF and HFpEF, right BKA, severe external hemorrhoid, IIDM, HTN, HLD, OSA to start home O2,  Clinical Impression  Patient received sitting up on side of bed. He reports significant pain in L knee and right hip down to knee. Received pain medicine prior to session. Patient requires max encouragement to attempt standing or bed to chair transfer with +2 assist at this time however states "if me knee goes out I will sue you".  Therefore, patient performed edge of bed scooting and supine>< sit bed mobility only. He will continue to benefit from skilled PT to improve functional independence.         If plan is discharge home, recommend the following: Assistance with cooking/housework;Help with stairs or ramp for entrance;Assist for transportation;Direct supervision/assist for medications management;Two people to help with bathing/dressing/bathroom;Two people to help with walking and/or transfers   Can travel by private vehicle   No    Equipment Recommendations None recommended by PT  Recommendations for Other Services       Functional Status Assessment Patient has had a recent decline in their functional status and demonstrates the ability to make significant improvements in function in a reasonable and predictable amount of time.     Precautions / Restrictions Precautions Precautions: Fall Other Brace: R prosthetic in room- pt declines to donn reports it is not fitting correctly right now Restrictions Weight Bearing Restrictions: No      Mobility  Bed Mobility Overal bed mobility: Modified  Independent             General bed mobility comments: increased effort, time, and heavy bed rail use but able to complete without direct assist    Transfers                   General transfer comment: lateral scoots on edge of bed with supervision, patient unwilling to attempt standing or pivoting to recliner with assistance due to L knee pain. Stating "If my knee goes out I will sue you."    Ambulation/Gait               General Gait Details: unable  Stairs            Wheelchair Mobility     Tilt Bed    Modified Rankin (Stroke Patients Only)       Balance Overall balance assessment: Modified Independent Sitting-balance support: Feet supported Sitting balance-Leahy Scale: Normal         Standing balance comment: not assessed as patient refused to attempt                             Pertinent Vitals/Pain Pain Assessment Pain Assessment: Faces Faces Pain Scale: Hurts whole lot Pain Location: L knee Pain Descriptors / Indicators: Discomfort Pain Intervention(s): Monitored during session, Premedicated before session, Repositioned    Home Living Family/patient expects to be discharged to:: Skilled nursing facility Living Arrangements: Alone Available Help at Discharge: Family;Friend(s);Available PRN/intermittently Type of Home: Apartment Home Access: Level entry       Home Layout: One level  Home Equipment: Rollator (4 wheels);Wheelchair - power;Shower seat      Prior Function Prior Level of Function : Needs assist       Physical Assist : ADLs (physical)     Mobility Comments: per chart, sleeps in recliner chair, pwc for mobility; currently unable to transfer into mwc and get around apartment per his report ADLs Comments: SET UP for feeding/grooming tasks with pt reporting he has been burning himself on stove; recent need for assist with dressing, bathing, toileting after previous discharge, unable to care for himself at  home; unable to manage meds     Extremity/Trunk Assessment   Upper Extremity Assessment Upper Extremity Assessment: Defer to OT evaluation RUE Sensation: decreased light touch RUE Coordination: decreased fine motor;decreased gross motor LUE Deficits / Details: L hand missing DIP and distal on ring finger; neuropathy throughout, mildly weak grip strength LUE Coordination: decreased fine motor;decreased gross motor    Lower Extremity Assessment Lower Extremity Assessment: RLE deficits/detail;LLE deficits/detail RLE Deficits / Details: old R BKA, L knee pain, R hip pain down leg patient reports "sciatic pain" RLE: Unable to fully assess due to pain RLE Coordination: decreased gross motor LLE: Unable to fully assess due to pain LLE Sensation: history of peripheral neuropathy LLE Coordination: decreased gross motor       Communication   Communication Communication: No apparent difficulties Cueing Techniques: Verbal cues  Cognition Arousal: Alert Behavior During Therapy: Agitated Overall Cognitive Status: No family/caregiver present to determine baseline cognitive functioning Area of Impairment: Awareness, Problem solving, Safety/judgement                             Problem Solving: Decreased initiation, Requires verbal cues          General Comments General comments (skin integrity, edema, etc.): vss throughout    Exercises     Assessment/Plan    PT Assessment Patient needs continued PT services  PT Problem List Decreased strength;Decreased activity tolerance;Decreased balance;Decreased mobility;Pain;Cardiopulmonary status limiting activity;Obesity       PT Treatment Interventions DME instruction;Gait training;Functional mobility training;Therapeutic activities;Therapeutic exercise;Balance training;Neuromuscular re-education;Patient/family education    PT Goals (Current goals can be found in the Care Plan section)  Acute Rehab PT Goals Patient Stated  Goal: to improve pain and mobility PT Goal Formulation: With patient Time For Goal Achievement: 11/24/22 Potential to Achieve Goals: Fair    Frequency Min 1X/week     Co-evaluation PT/OT/SLP Co-Evaluation/Treatment: Yes Reason for Co-Treatment: To address functional/ADL transfers;For patient/therapist safety PT goals addressed during session: Mobility/safety with mobility;Balance;Proper use of DME OT goals addressed during session: ADL's and self-care       AM-PAC PT "6 Clicks" Mobility  Outcome Measure Help needed turning from your back to your side while in a flat bed without using bedrails?: A Lot Help needed moving from lying on your back to sitting on the side of a flat bed without using bedrails?: A Lot Help needed moving to and from a bed to a chair (including a wheelchair)?: Total Help needed standing up from a chair using your arms (e.g., wheelchair or bedside chair)?: Total Help needed to walk in hospital room?: Total Help needed climbing 3-5 steps with a railing? : Total 6 Click Score: 8    End of Session   Activity Tolerance: Patient limited by pain Patient left: in bed;with call bell/phone within reach Nurse Communication: Mobility status PT Visit Diagnosis: Other abnormalities of gait and mobility (R26.89);Muscle weakness (  generalized) (M62.81);Difficulty in walking, not elsewhere classified (R26.2);Pain Pain - Right/Left:  (bilateral LEs)    Time: 8295-6213 PT Time Calculation (min) (ACUTE ONLY): 15 min   Charges:   PT Evaluation $PT Eval Low Complexity: 1 Low   PT General Charges $$ ACUTE PT VISIT: 1 Visit         Makya Phillis, PT, GCS 11/10/22,10:05 AM

## 2022-11-10 NOTE — Assessment & Plan Note (Addendum)
Recent GI bleed, hemoglobin currently stable at 7.1 Aspirin and Plavix are on hold.  No recurrence. -Adding supplement -Outpatient follow-up which was scheduled during most recent hospitalization

## 2022-11-10 NOTE — TOC Initial Note (Addendum)
Transition of Care [facility]) - Initial/Assessment Note    Patient Details  Name: Russell [name] MRN: [id number] Date of Birth: [birth year]  [facility] CM/SW Contact:    Allena Katz, LCSW Phone Number: 11/10/2022, 10:31 AM  Clinical Narrative:      CSW met pt at beside with discuss SNF. Pt reports he has already been in touch with [facility] admission coordinator michelle and that she has accepted him. CSW sent referral over and will call michelle in admissions to confirm.            11:53am [facility] unable to take pt. Pt would like to try another facility in [place]. Message sent to [facility] with [facility].   Expected Discharge Plan: Skilled Nursing Facility Barriers to Discharge: Continued Medical Work up   Patient Goals and CMS Choice Patient states their goals for this hospitalization and ongoing recovery are:: go to rehab CMS Medicare.gov Compare Post Acute Care list provided to:: Patient Choice offered to / list presented to : Patient      Expected Discharge Plan and Services                                              Prior Living Arrangements/Services   Lives with:: Spouse   Do you feel safe going back to the place where you live?: Yes        Care giver support system in place?: Yes (comment) Current home services: DME    Activities of Daily Living Home Assistive Devices/Equipment: Electric scooter, Wheelchair ADL Screening (condition at time of admission) Patient's cognitive ability adequate to safely complete daily activities?: Yes Is the patient deaf or have difficulty hearing?: No Does the patient have difficulty seeing, even when wearing glasses/contacts?: No Does the patient have difficulty concentrating, remembering, or making decisions?: No Patient able to express need for assistance with ADLs?: Yes Does the patient have difficulty dressing or bathing?: No Independently performs ADLs?: No Communication:  Independent Dressing (OT): Needs assistance Is this a change from baseline?: Change from baseline, expected to last >3 days Grooming: Independent Feeding: Independent Bathing: Needs assistance Is this a change from baseline?: Change from baseline, expected to last >3 days Toileting: Needs assistance Is this a change from baseline?: Change from baseline, expected to last >3days In/Out Bed: Needs assistance Is this a change from baseline?: Change from baseline, expected to last >3 days Walks in Home: Needs assistance Is this a change from baseline?: Change from baseline, expected to last >3 days Does the patient have difficulty walking or climbing stairs?: Yes Weakness of Legs: Both Weakness of Arms/Hands: None  Permission Sought/Granted                  Emotional Assessment     Affect (typically observed): Accepting Orientation: : Oriented to Self, Oriented to Place, Oriented to  Time, Oriented to Situation      Admission diagnosis:  Rectal pain [K62.89] Ambulatory dysfunction [R26.2] Impaired ambulation [R26.2] Patient Active Problem List   Diagnosis Date Noted   Generalized weakness 11/09/2022   Impaired ambulation 11/09/2022   Residual hemorrhoidal skin tags 11/06/2022   Acute gastritis 11/06/2022   Major depressive disorder, recurrent episode, moderate (HCC) 10/28/2022   Dyslipidemia 10/24/2022   Anxiety and depression 10/24/2022   Essential hypertension 10/24/2022   Type 2 diabetes mellitus with peripheral neuropathy (HCC) 10/24/2022  Acute gastric ulcer with hemorrhage 10/24/2022   Acute urinary retention 10/17/2022   Acute respiratory failure with hypoxia (HCC) 10/15/2022   Multiple gastric ulcers 10/14/2022   Hyperosmolar hyperglycemic state (HHS) (HCC) 10/13/2022   HLD (hyperlipidemia) 10/13/2022   Acute on chronic diastolic CHF (congestive heart failure) (HCC) 10/13/2022   Depression with anxiety 10/13/2022   Hyperkalemia 10/13/2022   Elevated lactic  acid level 10/13/2022   Rectal bleeding 10/13/2022   Acute blood loss anemia 10/13/2022   Wound infection_ left lower leg 10/13/2022   Upper GI bleed 10/13/2022   Hypovolemic shock (HCC) 10/13/2022   Epigastric abdominal pain 10/13/2022   Sciatica of right side 10/13/2022   AKI (acute kidney injury) (HCC) 10/13/2022   Hypotension 11/21/2020   Atrial flutter with rapid ventricular response (HCC) 11/21/2020   Chronic kidney disease, stage 4, severely decreased GFR (HCC) 06/14/2018   Chronic venous insufficiency 06/14/2018   Cellulitis of left lower extremity 02/01/2018   Cellulitis 05/18/2017   Hyperlipidemia, mixed 01/10/2017   Cellulitis and abscess of left leg 08/08/2016   Diabetes with ulcer of leg (HCC) 08/08/2016   Hyperglycemia 08/08/2016   Morbid obesity (HCC) 08/08/2016   Cellulitis and abscess of leg 08/08/2016   Adjustment disorder with depressed mood 11/21/2014   Other social stressor 11/21/2014   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   Diabetes (HCC) 11/21/2014   PCP:  [name], MD Pharmacy:   [facility] [address] ([address]), [place] - [address] [place] ([place]) [place] [address] Phone: [phone] Fax: [phone]  [id number] [place], [place] - [address] AT [facility] [address] [place] [address] Phone: [phone] Fax: [phone]     Social Determinants of Health (SDOH) Social History: SDOH Screenings   Food Insecurity: No Food Insecurity (11/09/2022)  Housing: Low Risk  (11/09/2022)  Transportation Needs: No Transportation Needs (11/09/2022)  Utilities: Not At Risk (11/09/2022)  Financial Resource Strain: Medium Risk (06/17/2022)   Received from [facility]  Tobacco Use: Low Risk  (11/09/2022)   SDOH Interventions:     Readmission Risk Interventions    10/25/2022    3:55 PM  Readmission Risk Prevention Plan  Transportation Screening  Complete  PCP or Specialist Appt within 3-5 Days Complete  HRI or Home Care Consult Complete  Social Work Consult for Recovery Care Planning/Counseling Complete  Palliative Care Screening Not Applicable  Medication Review Oceanographer) Complete

## 2022-11-10 NOTE — Evaluation (Signed)
Occupational Therapy Evaluation Patient Details Name: Russell [name] MRN: [id number] DOB: 1965-10-07 Today's Date: 11/10/2022   History of Present Illness Pt is a 57 year old male presenting to ED with multiple complaints, pt reports he is unable to care for himself at home.  PMH significant for recent acute blood loss anemia secondary to upper GI bleed status post EGD and PRBC, chronic combined HFrEF and HFpEF, right BKA, severe external hemorrhoid, IIDM, HTN, HLD, OSA to start home O2,   Clinical Impression   Chart reviewed, pt greeted sitting on edge of bed, requiring encouragement for participation in OT/PT co tx. Pt appears tearful regarding current level of function. Pt declines mobility further than edge of bed. PTA pt was recently discharged from hospital requiring assits for ADL/IADL and functional mobility.Pt was unable to manage his medications and care for himself. Bed mobility performed with MOD I, good static/dynamic sitting balance for SET UP feeding/grooming tasks. Pt declines further out of bed mobility, agrees in future sessions. Pt presents with deficits in activity tolerance, strength, balance, vision (cataracts at baseline affecting performance, was supposed to get surgery) affecting safe and optimal ADL completion. Fair awareness of current level of deficits, understanding of tx goals going forward to facilitate improved ADL function. OT will follow acutely to address deficits. Pt is left on edge of bed, all needs met.       If plan is discharge home, recommend the following: Assistance with cooking/housework;Direct supervision/assist for medications management;Direct supervision/assist for financial management;Assist for transportation;Help with stairs or ramp for entrance;A lot of help with walking and/or transfers;A lot of help with bathing/dressing/bathroom    Functional Status Assessment  Patient has had a recent decline in their functional status and demonstrates the  ability to make significant improvements in function in a reasonable and predictable amount of time.  Equipment Recommendations  Other (comment) (per next venue of care)    Recommendations for Other Services       Precautions / Restrictions Precautions Precautions: Fall Other Brace: R prosthetic in room- pt declines to donn reports it is not fitting correctly right now Restrictions Weight Bearing Restrictions: No      Mobility Bed Mobility Overal bed mobility: Modified Independent                  Transfers   Equipment used: None               General transfer comment: lateral scoots on edge of bed with supervision      Balance Overall balance assessment: Needs assistance Sitting-balance support: Feet unsupported, Feet supported, No upper extremity supported Sitting balance-Leahy Scale: Good Sitting balance - Comments: good dynamic sitting balance                                   ADL either performed or assessed with clinical judgement   ADL Overall ADL's : Needs assistance/impaired Eating/Feeding: Set up;Sitting Eating/Feeding Details (indicate cue type and reason): on edge of bed Grooming: Sitting;Set up;Wash/dry face Grooming Details (indicate cue type and reason): on edge of bed             Lower Body Dressing: Moderate assistance Lower Body Dressing Details (indicate cue type and reason): sock   Toilet Transfer Details (indicate cue type and reason): pt declined on this date Toileting- Clothing Manipulation and Hygiene: Maximal assistance  Vision Baseline Vision/History: 4 Cataracts (waiting for surgery) Patient Visual Report: No change from baseline;Other (comment) (sees shadows)       Perception         Praxis         Pertinent Vitals/Pain Pain Assessment Pain Assessment: 0-10 Pain Score: 10-Worst pain ever Pain Location: L knee Pain Descriptors / Indicators: Discomfort, Grimacing Pain  Intervention(s): Premedicated before session, Monitored during session, Repositioned     Extremity/Trunk Assessment Upper Extremity Assessment Upper Extremity Assessment: Generalized weakness;LUE deficits/detail;RUE deficits/detail RUE Sensation: decreased light touch RUE Coordination: decreased fine motor;decreased gross motor LUE Deficits / Details: L hand missing DIP and distal on ring finger; neuropathy throughout, mildly weak grip strength LUE Coordination: decreased fine motor;decreased gross motor   Lower Extremity Assessment Lower Extremity Assessment: Generalized weakness;RLE deficits/detail RLE Deficits / Details: old R BKA LLE Sensation: history of peripheral neuropathy       Communication Communication Communication: No apparent difficulties Cueing Techniques: Verbal cues;Tactile cues;Visual cues   Cognition Arousal: Alert Behavior During Therapy: WFL for tasks assessed/performed Overall Cognitive Status: No family/caregiver present to determine baseline cognitive functioning Area of Impairment: Awareness, Problem solving, Safety/judgement                         Safety/Judgement: Decreased awareness of deficits Awareness: Emergent Problem Solving: Requires verbal cues General Comments: pt is tearful regading current level of function     General Comments  vss throughout    Exercises Other Exercises Other Exercises: edu re: role of OT, role of rehab, discharge recommendations   Shoulder Instructions      Home Living Family/patient expects to be discharged to:: Skilled nursing facility Living Arrangements: Alone Available Help at Discharge: Family;Friend(s);Available PRN/intermittently Type of Home: Apartment Home Access: Level entry     Home Layout: One level     Bathroom Shower/Tub: Tub/shower unit;Other (comment)   Bathroom Toilet: Handicapped height Bathroom Accessibility: Yes   Home Equipment: Rollator (4 wheels);Wheelchair -  power;Shower seat          Prior Functioning/Environment Prior Level of Function : Needs assist       Physical Assist : ADLs (physical)     Mobility Comments: per chart, sleeps in recliner chair, pwc for mobility; currently unable to transfer into mwc and get around apartment per his report ADLs Comments: SET UP for feeding/grooming tasks with pt reporting he has been burning himself on stove; recent need for assist with dressing, bathing, toileting after previous discharge, unable to care for himself at home; unable to manage meds        OT Problem List: Decreased strength;Decreased range of motion;Decreased activity tolerance;Impaired balance (sitting and/or standing);Decreased coordination;Cardiopulmonary status limiting activity;Obesity      OT Treatment/Interventions:      OT Goals(Current goals can be found in the care plan section) Acute Rehab OT Goals Patient Stated Goal: go to rehab OT Goal Formulation: With patient Time For Goal Achievement: 11/24/22 Potential to Achieve Goals: Good ADL Goals Pt Will Perform Grooming: with modified independence;sitting Pt Will Perform Lower Body Dressing: with set-up;sitting/lateral leans Pt Will Transfer to Toilet: with modified independence;stand pivot transfer Pt Will Perform Toileting - Clothing Manipulation and hygiene: with modified independence;sitting/lateral leans  OT Frequency: Min 1X/week    Co-evaluation PT/OT/SLP Co-Evaluation/Treatment: Yes Reason for Co-Treatment: To address functional/ADL transfers;For patient/therapist safety PT goals addressed during session: Mobility/safety with mobility;Balance;Proper use of DME OT goals addressed during session: ADL's and self-care  AM-PAC OT "6 Clicks" Daily Activity     Outcome Measure Help from another person eating meals?: None Help from another person taking care of personal grooming?: None Help from another person toileting, which includes using toliet, bedpan,  or urinal?: A Lot Help from another person bathing (including washing, rinsing, drying)?: A Little Help from another person to put on and taking off regular upper body clothing?: A Little Help from another person to put on and taking off regular lower body clothing?: A Lot 6 Click Score: 18   End of Session Equipment Utilized During Treatment: Oxygen Nurse Communication: Mobility status  Activity Tolerance: Patient limited by pain Patient left: Other (comment) (on edge of bed with all needs met)  OT Visit Diagnosis: Muscle weakness (generalized) (M62.81);Other abnormalities of gait and mobility (R26.89)                Time: 1610-9604 OT Time Calculation (min): 18 min Charges:  OT General Charges $OT Visit: 1 Visit OT Evaluation $OT Eval Moderate Complexity: 1 Mod  Oleta Mouse, OTD OTR/L  11/10/22, 9:58 AM

## 2022-11-10 NOTE — Assessment & Plan Note (Addendum)
CBG elevated. -Continue with Semglee and SSI -Continue Lyrica

## 2022-11-11 DIAGNOSIS — R531 Weakness: Secondary | ICD-10-CM | POA: Diagnosis not present

## 2022-11-11 DIAGNOSIS — R338 Other retention of urine: Secondary | ICD-10-CM | POA: Diagnosis not present

## 2022-11-11 DIAGNOSIS — R262 Difficulty in walking, not elsewhere classified: Secondary | ICD-10-CM | POA: Diagnosis not present

## 2022-11-11 DIAGNOSIS — E1142 Type 2 diabetes mellitus with diabetic polyneuropathy: Secondary | ICD-10-CM | POA: Diagnosis not present

## 2022-11-11 LAB — GLUCOSE, CAPILLARY
Glucose-Capillary: 188 mg/dL — ABNORMAL HIGH (ref 70–99)
Glucose-Capillary: 149 mg/dL — ABNORMAL HIGH (ref 70–99)
Glucose-Capillary: 85 mg/dL (ref 70–99)
Glucose-Capillary: 183 mg/dL — ABNORMAL HIGH (ref 70–99)

## 2022-11-11 MED ORDER — INSULIN ASPART 100 UNIT/ML IJ SOLN
0.0000 [IU] | Freq: Every day | INTRAMUSCULAR | Status: DC
  Administered 2022-11-13: 2 [IU] via SUBCUTANEOUS
  Filled 2022-11-11: qty 1

## 2022-11-11 MED ORDER — INSULIN ASPART 100 UNIT/ML IJ SOLN
0.0000 [IU] | Freq: Three times a day (TID) | INTRAMUSCULAR | Status: DC
  Administered 2022-11-11: 3 [IU] via SUBCUTANEOUS
  Administered 2022-11-11: 2 [IU] via SUBCUTANEOUS
  Administered 2022-11-12 (×2): 3 [IU] via SUBCUTANEOUS
  Administered 2022-11-12: 2 [IU] via SUBCUTANEOUS
  Administered 2022-11-13: 3 [IU] via SUBCUTANEOUS
  Administered 2022-11-13: 2 [IU] via SUBCUTANEOUS
  Administered 2022-11-13 – 2022-11-14 (×2): 3 [IU] via SUBCUTANEOUS
  Administered 2022-11-14 (×2): 2 [IU] via SUBCUTANEOUS
  Administered 2022-11-15: 3 [IU] via SUBCUTANEOUS
  Administered 2022-11-15: 2 [IU] via SUBCUTANEOUS
  Administered 2022-11-15 – 2022-11-16 (×3): 3 [IU] via SUBCUTANEOUS
  Filled 2022-11-11 (×15): qty 1

## 2022-11-11 MED ORDER — SALINE SPRAY 0.65 % NA SOLN
1.0000 | NASAL | Status: DC | PRN
  Filled 2022-11-11: qty 44

## 2022-11-11 MED ORDER — HYDROXYZINE HCL 50 MG PO TABS
25.0000 mg | ORAL_TABLET | Freq: Three times a day (TID) | ORAL | Status: DC | PRN
  Filled 2022-11-11: qty 1

## 2022-11-11 MED ORDER — OXYCODONE-ACETAMINOPHEN 5-325 MG PO TABS
1.0000 | ORAL_TABLET | Freq: Four times a day (QID) | ORAL | Status: DC | PRN
  Administered 2022-11-11: 1 via ORAL
  Administered 2022-11-12: 2 via ORAL
  Filled 2022-11-11 (×3): qty 2

## 2022-11-11 MED ORDER — INSULIN ASPART 100 UNIT/ML IJ SOLN
4.0000 [IU] | Freq: Three times a day (TID) | INTRAMUSCULAR | Status: DC
  Administered 2022-11-11 – 2022-11-13 (×9): 4 [IU] via SUBCUTANEOUS
  Filled 2022-11-11 (×9): qty 1

## 2022-11-11 NOTE — Plan of Care (Signed)
Pt was unable to void for 14 hours but finally voided when he got up to the [facility]. Bladder scans are not accurate due to his weight.   Problem: Cardiac: Goal: Ability to maintain an adequate cardiac output will improve Outcome: Progressing   Problem: Health Behavior/Discharge Planning: Goal: Ability to identify and utilize available resources and services will improve Outcome: Progressing   Problem: Fluid Volume: Goal: Ability to achieve a balanced intake and output will improve Outcome: Progressing   Problem: Metabolic: Goal: Ability to maintain appropriate glucose levels will improve Outcome: Progressing   Problem: Nutritional: Goal: Maintenance of adequate nutrition will improve Outcome: Progressing

## 2022-11-11 NOTE — Inpatient Diabetes Management (Signed)
Inpatient Diabetes Program Recommendations  AACE/ADA: New Consensus Statement on Inpatient Glycemic Control   Target Ranges:  Prepandial:   less than 140 mg/dL      Peak postprandial:   less than 180 mg/dL (1-2 hours)      Critically ill patients:  140 - 180 mg/dL    Latest Reference Range & Units 11/10/22 07:48 11/10/22 11:12 11/10/22 15:50 11/10/22 21:58  Glucose-Capillary 70 - 99 mg/dL 528 (H) 413 (H) 244 (H) 264 (H)   Review of Glycemic Control  Diabetes history: DM2 Outpatient Diabetes medications: Levemir 25 units BID, Regular 15 units TID with meals  Current orders for Inpatient glycemic control: Semglee 20 units at bedtime, Novolog 0-9 units TID with meals  Inpatient Diabetes Program Recommendations:    Insulin: Please consider ordering Novolog 0-5 units at bedtime and Novolog 4 units TID with meals for meal coverage if patient eats at least 50% of meals.  Thanks, Orlando Penner, RN, MSN, CDCES Diabetes Coordinator Inpatient Diabetes Program [phone] (Team Pager from 8am to 5pm)

## 2022-11-11 NOTE — Progress Notes (Signed)
Patient has not voided since I&O cath around 0100. Bladder scan completed by NT. Less than 50 noted. Will continue to monitor.

## 2022-11-11 NOTE — Assessment & Plan Note (Signed)
CBG elevated. -Continue with Semglee  -Making SSI moderate with nighttime coverage -Adding 4 units with meal -Continue Lyrica

## 2022-11-11 NOTE — Progress Notes (Signed)
Pt observed sitting on side of the bed, O2 off, states he doesn't want to put it on right now, refuses bed alarm, bed locked and in lowest position, call bell remains in reach.

## 2022-11-11 NOTE — Assessment & Plan Note (Signed)
Generalized weakness. TOC is working on placement as recommended during prior hospitalization. Patient is now agreeable to go to a rehab.

## 2022-11-11 NOTE — Progress Notes (Signed)
Progress Note   Patient: Russell [name]:[id number] DOB: [birth year] DOA: 11/09/2022     0 DOS: the patient was seen and examined on 11/11/2022   Brief hospital course: Taken from H&P.  KAVIOUS SCHROM is a 57 y.o. male with medical history significant of recent acute blood loss anemia secondary to upper GI bleed status post EGD and PRBC, chronic combined HFrEF and HFpEF, right BKA, severe external hemorrhoid, IIDM, HTN, HLD, OSA to start home O2, came back from home for multiple complaints including worsening of ambulation impairment.   Patient was just recently hospitalized during 8/24 till 11/08/2022 for recurrent upper GI bleed underwent EGD and PRBC x 4, after which his hemoglobin stabilized in the 7.0-7.5 range.  Patient was also treated for severe internal/external hemorrhoid with rectal prolapse with Anusol cream with some improvement and patient is scheduled to see colorectal surgeon in [facility] Dr. [name] next month.  During hospital stay, PT evaluation recommended patient going to SNF for physical therapy however patient declined.    On presentation patient was having stable vitals and labs.  9/11: Vital stable.  TOC is working on placement as patient is unable to take care of himself.  9/12: Vital stable, CBG remained elevated, making sliding scale moderate and adding 4 units with meal.  Pending SNF placement.  Keep asking for IV opioids which we will currently avoid, added IV Toradol and increasing the dose of Percocet.  Assessment and Plan: * Ambulatory dysfunction Generalized weakness. TOC is working on placement as recommended during prior hospitalization. Patient is now agreeable to go to a rehab.  Type 2 diabetes mellitus with peripheral neuropathy (HCC) CBG elevated. -Continue with Semglee  -Making SSI moderate with nighttime coverage -Adding 4 units with meal -Continue Lyrica  Acute urinary retention Patient was discharged with Foley catheter in place and  need to have a outpatient urology evaluation for a voiding trial. Outpatient follow-up appointment was made.  Acute blood loss anemia Recent GI bleed, hemoglobin currently stable at 7.1 Aspirin and Plavix are on hold.  No recurrence. -Adding supplement -Outpatient follow-up which was scheduled during most recent hospitalization  Morbid obesity (HCC) Estimated body mass index is 53.84 kg/m as calculated from the following:   Height as of this encounter: 5\' 11"  (1.803 m).   Weight as of this encounter: 175.1 kg.   -This will complicate overall prognosis.  PAD (peripheral artery disease) (HCC) S/p right BKA. -Home aspirin and Plavix are on hold due to recent GI bleed.   Subjective: Patient was complaining of a lot of pain in lower extremities and wanted to get some IV medications.  Make it clear that we will avoid IV opioids, I can increase the dose of Percocet to 1 to 2 tablets instead of 1 and can try Toradol.  Physical Exam: Vitals:   11/10/22 0740 11/10/22 1554 11/10/22 1952 11/11/22 0638  BP: 139/79 115/83 (!) 113/53 124/75  Pulse: 78 77 79 77  Resp: 19 18 20 20   Temp: 97.6 F (36.4 C) 98 F (36.7 C) 98.1 F (36.7 C) 98.4 F (36.9 C)  TempSrc: Oral     SpO2: 95% 98% 93% 96%  Weight:      Height:       General.  Morbidly obese gentleman, in no acute distress. Pulmonary.  Lungs clear bilaterally, normal respiratory effort. CV.  Regular rate and rhythm, no JVD, rub or murmur. Abdomen.  Soft, nontender, nondistended, BS positive. CNS.  Alert and oriented .  No  focal neurologic deficit. Extremities.  Right BKA, no edema on left Psychiatry.  Judgment and insight appears normal.   Data Reviewed: Prior data reviewed  Family Communication: Discussed with patient  Disposition: Status is: Observation The patient remains OBS appropriate and will d/c before 2 midnights.  Planned Discharge Destination: Skilled nursing facility  Time spent: 39 minutes  This record has  been created using Conservation officer, historic buildings. Errors have been sought and corrected,but may not always be located. Such creation errors do not reflect on the standard of care.   Author: Arnetha Courser, MD 11/11/2022 3:19 PM  For on call review [email or web address].

## 2022-11-12 DIAGNOSIS — R262 Difficulty in walking, not elsewhere classified: Secondary | ICD-10-CM | POA: Diagnosis not present

## 2022-11-12 LAB — GLUCOSE, CAPILLARY
Glucose-Capillary: 170 mg/dL — ABNORMAL HIGH (ref 70–99)
Glucose-Capillary: 162 mg/dL — ABNORMAL HIGH (ref 70–99)
Glucose-Capillary: 129 mg/dL — ABNORMAL HIGH (ref 70–99)
Glucose-Capillary: 182 mg/dL — ABNORMAL HIGH (ref 70–99)

## 2022-11-12 MED ORDER — OXYCODONE-ACETAMINOPHEN 5-325 MG PO TABS
1.0000 | ORAL_TABLET | Freq: Four times a day (QID) | ORAL | Status: DC | PRN
  Administered 2022-11-13 – 2022-11-14 (×5): 1 via ORAL
  Filled 2022-11-12 (×5): qty 1

## 2022-11-12 MED ORDER — HYDROXYZINE HCL 50 MG PO TABS
50.0000 mg | ORAL_TABLET | Freq: Every day | ORAL | Status: DC
  Administered 2022-11-12 – 2022-11-15 (×4): 50 mg via ORAL
  Filled 2022-11-12 (×4): qty 1

## 2022-11-12 MED ORDER — ENOXAPARIN SODIUM 100 MG/ML IJ SOSY
0.5000 mg/kg | PREFILLED_SYRINGE | INTRAMUSCULAR | Status: DC
  Administered 2022-11-12 – 2022-11-15 (×4): 87.5 mg via SUBCUTANEOUS
  Filled 2022-11-12 (×5): qty 1

## 2022-11-12 NOTE — Progress Notes (Signed)
PHARMACIST - PHYSICIAN COMMUNICATION  CONCERNING:  Enoxaparin (Lovenox) for DVT Prophylaxis    RECOMMENDATION: Patient was prescribed enoxaprin 40mg  q24 hours for VTE prophylaxis.   Filed Weights   11/09/22 1348 11/09/22 1805  Weight: (!) 176.9 kg (390 lb) (!) 175.1 kg (386 lb 0.4 oz)    Body mass index is 53.84 kg/m.  Estimated Creatinine Clearance: 141.1 mL/min (by C-G formula based on SCr of 0.93 mg/dL).   Based on [name] policy patient is candidate for enoxaparin 0.5mg /kg TBW SQ every 24 hours based on BMI being >30.  DESCRIPTION: Pharmacy has adjusted enoxaparin dose per [facility] policy.  Patient is now receiving enoxaparin 87.5 mg every 24 hours   Thank you for involving pharmacy in this patient's care.   Rockwell Alexandria, PharmD Clinical Pharmacist 11/12/2022 8:03 PM

## 2022-11-12 NOTE — Plan of Care (Signed)

## 2022-11-12 NOTE — Progress Notes (Signed)
Occupational Therapy Treatment Patient Details Name: Russell [name] MRN: [id number] DOB: Jun 19, 1965 Today's Date: 11/12/2022   History of present illness Pt is a 57 year old male presenting to ED with multiple complaints, pt reports he is unable to care for himself at home.  PMH significant for recent acute blood loss anemia secondary to upper GI bleed status post EGD and PRBC, chronic combined HFrEF and HFpEF, right BKA, severe external hemorrhoid, IIDM, HTN, HLD, OSA to start home O2,   OT comments  Pt seen for OT treatment on this date,agreeable to OT/PT co tx. OT tx session targeted improving functional activity tolerance in prep for out of bed ADL tasks. Improvements noted in overall participation on this date with pt generally agreeable for all provided tasks. Improvements noted in lateral scooting up bed requiring CGA, LB dressing with supervision. Pt prosthetic liner not in room, not safe to attempt amb to bathroom with use of prosthetic. Pt making good progress toward goals, will continue to follow POC. Discharge recommendation remains appropriate.       If plan is discharge home, recommend the following:  Assistance with cooking/housework;Direct supervision/assist for medications management;Direct supervision/assist for financial management;Assist for transportation;Help with stairs or ramp for entrance;A lot of help with walking and/or transfers;A lot of help with bathing/dressing/bathroom   Equipment Recommendations  Other (comment)    Recommendations for Other Services      Precautions / Restrictions Precautions Precautions: Fall Required Braces or Orthoses: Other Brace Other Brace: R prosthetic in room- Pt is missing his prosthetic liner Restrictions Weight Bearing Restrictions: Yes LLE Weight Bearing: Weight bearing as tolerated       Mobility Bed Mobility Overal bed mobility: Modified Independent Bed Mobility: Sit to Supine           General bed mobility  comments: increased effort, time, and heavy bed rail use but able to complete without direct assist,lateral scoots Patient Response: Cooperative  Transfers Overall transfer level: Needs assistance Equipment used: Rolling walker (2 wheels)   Sit to Stand: Min assist, +2 safety/equipment, +2 physical assistance, From elevated surface          Lateral/Scoot Transfers: Supervision General transfer comment: lateral scoots on edge of bed with supervision, EOB<>standing with walker  MOD A for support +2 (OT and PT present)     Balance Overall balance assessment: Modified Independent Sitting-balance support: Feet supported Sitting balance-Leahy Scale: Normal Sitting balance - Comments: good dynamic sitting balance, completed midline crossing high-fives 10 both sides   Standing balance support: Bilateral upper extremity supported, During functional activity, Reliant on assistive device for balance                               ADL either performed or assessed with clinical judgement   ADL Overall ADL's : Needs assistance/impaired     Grooming: Set up;Supervision/safety;Sitting               Lower Body Dressing: Contact guard assist Lower Body Dressing Details (indicate cue type and reason): sock and compression garment             Functional mobility during ADLs: Maximal assistance      Extremity/Trunk Assessment              Vision       Perception     Praxis      Cognition Arousal: Alert Behavior During Therapy: WFL for tasks assessed/performed Overall Cognitive  Status: Within Functional Limits for tasks assessed Area of Impairment: Awareness, Problem solving, Safety/judgement                         Safety/Judgement: Decreased awareness of deficits Awareness: Emergent Problem Solving: Requires verbal cues          Exercises Other Exercises Other Exercises: Dynamic sitting tasks in prep for toilet transfers: crossing  midline to high five OT, 10 reps on both UE to target    Shoulder Instructions       General Comments      Pertinent Vitals/ Pain       Pain Assessment Pain Assessment: No/denies pain Pain Score: 0-No pain  Home Living                                          Prior Functioning/Environment              Frequency  Min 1X/week        Progress Toward Goals  OT Goals(current goals can now be found in the care plan section)  Progress towards OT goals: Progressing toward goals  Acute Rehab OT Goals Patient Stated Goal: go to longterm care OT Goal Formulation: With patient Time For Goal Achievement: 11/26/22 Potential to Achieve Goals: Good  Plan      Co-evaluation    PT/OT/SLP Co-Evaluation/Treatment: Yes Reason for Co-Treatment: To address functional/ADL transfers;For patient/therapist safety PT goals addressed during session: Mobility/safety with mobility;Balance;Proper use of DME        AM-PAC OT "6 Clicks" Daily Activity     Outcome Measure   Help from another person eating meals?: None Help from another person taking care of personal grooming?: None Help from another person toileting, which includes using toliet, bedpan, or urinal?: A Lot Help from another person bathing (including washing, rinsing, drying)?: A Little Help from another person to put on and taking off regular upper body clothing?: A Little Help from another person to put on and taking off regular lower body clothing?: A Lot 6 Click Score: 18    End of Session Equipment Utilized During Treatment: Rolling walker (2 wheels)  OT Visit Diagnosis: Muscle weakness (generalized) (M62.81);Other abnormalities of gait and mobility (R26.89)   Activity Tolerance Patient tolerated treatment well (Requesded increase in oxygen for the duration of session)   Patient Left in bed;with call bell/phone within reach   Nurse Communication Mobility status        Time: 1440-1510 OT  Time Calculation (min): 30 min  Charges: OT General Charges $OT Visit: 1 Visit OT Treatments $Therapeutic Activity: 8-22 mins  [employer], OTS

## 2022-11-12 NOTE — Progress Notes (Signed)
PROGRESS NOTE    Russell [name]  [id number]:[id number] DOB: 1965/04/02 DOA: 11/09/2022 PCP: [name], MD  129A/129A-AA  LOS: 0 days   Brief hospital course:   Assessment & Plan: Russell [name] is a 57 y.o. male with medical history significant of recent acute blood loss anemia secondary to upper GI bleed status post EGD and PRBC, chronic combined HFrEF and HFpEF, right BKA, severe external hemorrhoid, IIDM, HTN, HLD, OSA to start home O2, came back from home for multiple complaints including worsening of ambulation impairment.   Patient was just recently hospitalized during 8/24 till 11/08/2022 for recurrent upper GI bleed underwent EGD and PRBC x 4, after which his hemoglobin stabilized in the 7.0-7.5 range.  Patient was also treated for severe internal/external hemorrhoid with rectal prolapse with Anusol cream with some improvement and patient is scheduled to see colorectal surgeon in [facility] Dr. [name] next month.  During hospital stay, PT evaluation recommended patient going to SNF for physical therapy however patient declined.     On presentation patient was having stable vitals and labs.   9/11: Vital stable.  TOC is working on placement as patient is unable to take care of himself.   9/12: Vital stable, CBG remained elevated, making sliding scale moderate and adding 4 units with meal.  Pending SNF placement.  Keep asking for IV opioids which we will currently avoid, added IV Toradol and increasing the dose of Percocet.  * Ambulatory dysfunction Generalized weakness. TOC is working on placement as recommended during prior hospitalization. Patient is now agreeable to go to a rehab.   Type 2 diabetes mellitus with peripheral neuropathy (HCC) CBG elevated. --cont glargine 20u nightly --mealtime 4u TID --SSI  Pain in LE's 2/2 Peripheral neuropathy --cont Lyrica --cont Percocet for now --No IV opioids   Hx of Acute urinary retention Patient was NOT discharged  with Foley catheter on 11/08/22.  Pt demanded to have Foley removed prior to leaving the hospital. --monitor for retention    Hx of Acute blood loss anemia Recent GI bleed, hemoglobin currently around low 7's. Aspirin and Plavix are on hold.     Morbid obesity (HCC) Estimated body mass index is 53.84 kg/m as calculated from the following:   Height as of this encounter: 5\' 11"  (1.803 m).   Weight as of this encounter: 175.1 kg.    PAD (peripheral artery disease) (HCC) S/p right BKA. -Home aspirin and Plavix are on hold due to recent GI bleed.    DVT prophylaxis: Lovenox SQ Code Status: Full code  Family Communication:  Level of care: Med-Surg Dispo:   The patient is from: home Anticipated d/c is to: SNF rehab Anticipated d/c date is: whenever bed available   Subjective and Interval History:  Pt complained of pain in his LE's due to neuropathy.   Objective: Vitals:   11/11/22 2012 11/12/22 0442 11/12/22 0757 11/12/22 1607  BP: 127/67 (!) 142/83 139/75 (!) 143/62  Pulse: 75 80 75 77  Resp: 16 12 19 20   Temp: 97.9 F (36.6 C) 97.9 F (36.6 C) 97.8 F (36.6 C) 98.3 F (36.8 C)  TempSrc: Oral Oral Oral Oral  SpO2: 100% 96% 99% 97%  Weight:      Height:        Intake/Output Summary (Last 24 hours) at 11/12/2022 1940 Last data filed at 11/12/2022 1621 Gross per 24 hour  Intake 600 ml  Output --  Net 600 ml   Filed Weights   11/09/22 1348  11/09/22 1805  Weight: (!) 176.9 kg (!) 175.1 kg    Examination:   Constitutional: NAD, AAOx3 HEENT: conjunctivae and lids normal, EOMI CV: No cyanosis.   RESP: normal respiratory effort, on RA Extremities: right BKA, LLE with venous stasis changes Neuro: II - XII grossly intact.   Psych: Normal mood and affect.     Data Reviewed: I have personally reviewed labs and imaging studies  Time spent: 50 minutes  [name], MD [facility] If 7PM-7AM, please contact night-coverage 11/12/2022, 7:40 PM

## 2022-11-12 NOTE — TOC Progression Note (Signed)
Transition of Care [facility]) - Progression Note    Patient Details  Name: Russell [name] MRN: [id number] Date of Birth: 1965/12/23  [facility]) CM/SW Contact  Hetty Ely, RN Phone Number: 11/12/2022, 3:23 PM  Clinical Narrative:  CM spoke with patient and wife, [name] who were receptive to [facility] Ammie Ferrier patient's wife says she and her daughter will be able to visit often and look talk with him. Raul, Admission Coordinator confirmed bed offer, Authorization process started and pending at this time.     Expected Discharge Plan: Skilled Nursing Facility Barriers to Discharge: Continued Medical Work up  Expected Discharge Plan and Services                                               Social Determinants of Health (SDOH) Interventions SDOH Screenings   Food Insecurity: No Food Insecurity (11/09/2022)  Housing: Low Risk  (11/09/2022)  Transportation Needs: No Transportation Needs (11/09/2022)  Utilities: Not At Risk (11/09/2022)  Financial Resource Strain: Medium Risk (06/17/2022)   Received from [facility]  Tobacco Use: Low Risk  (11/09/2022)    Readmission Risk Interventions    10/25/2022    3:55 PM  Readmission Risk Prevention Plan  Transportation Screening Complete  PCP or Specialist Appt within 3-5 Days Complete  HRI or Home Care Consult Complete  Social Work Consult for Recovery Care Planning/Counseling Complete  Palliative Care Screening Not Applicable  Medication Review Oceanographer) Complete

## 2022-11-12 NOTE — Progress Notes (Signed)
Physical Therapy Treatment Patient Details Name: Russell [name] MRN: [id number] DOB: Dec 13, 1965 Today's Date: 11/12/2022   History of Present Illness Pt is a 57 year old male presenting to ED with multiple complaints, pt reports he is unable to care for himself at home.  PMH significant for recent acute blood loss anemia secondary to upper GI bleed status post EGD and PRBC, chronic combined HFrEF and HFpEF, right BKA, severe external hemorrhoid, IIDM, HTN, HLD, OSA to start home O2,    PT Comments  PT/OT co-treat 2/2 to pts limited activity tolerance and need for +2 assistance. Pt was agreeable to session but limited by L knee pain in wt bearing and lack of liner for his prosthesis. Pt reports his liner has not fit since he got it and he has just been stuffing towels/washclothes/ect into socket to keep prosthesis suspended. Pt's prosthesis was made by [employer] [facility]. Author called [employer] to request updated/better fitting liner however they report they will need MD order and wont be able to do while pt is in hospital. Acute PT will continue efforts to maximize pt's independence and safety. Author highly recommends pt/staff not use prosthesis until liner can be acquired. DC recs remain appropriate.     If plan is discharge home, recommend the following: A lot of help with walking and/or transfers;A little help with bathing/dressing/bathroom;Assistance with cooking/housework;Direct supervision/assist for medications management;Direct supervision/assist for financial management;Assist for transportation;Help with stairs or ramp for entrance   Can travel by private vehicle     No  Equipment Recommendations  None recommended by PT       Precautions / Restrictions Precautions Precautions: Fall Required Braces or Orthoses: Other Brace Other Brace: R prosthetic in room- Pt is missing his prosthetic liner Restrictions Weight Bearing Restrictions: Yes LLE Weight Bearing: Weight bearing as tolerated      Mobility  Bed Mobility Overal bed mobility: Modified Independent   Transfers Overall transfer level: Needs assistance Equipment used: None, Rolling walker (2 wheels) Transfers: Sit to/from Stand Sit to Stand: Contact guard assist, +2 safety/equipment, From elevated surface    General transfer comment: Pt was able to stand several times EOB. He has prosthetic limb however no liner. Called [employer](company that made prosthesis) to see about getting a replacement liner    Ambulation/Gait    General Gait Details: Elected not to ambulate due to lack of liner + L knee OA/pain in wt bearing    Balance Overall balance assessment: Modified Independent Sitting-balance support: Feet supported Sitting balance-Leahy Scale: Normal     Standing balance support: Bilateral upper extremity supported, During functional activity, Reliant on assistive device for balance Standing balance-Leahy Scale: Fair       Cognition Arousal: Alert Behavior During Therapy: WFL for tasks assessed/performed Overall Cognitive Status: Within Functional Limits for tasks assessed      Safety/Judgement: Decreased awareness of safety, Decreased awareness of deficits   Problem Solving: Slow processing, Decreased initiation General Comments: Pt is alert and seems to be good historian of past medical history               Pertinent Vitals/Pain Pain Assessment Pain Assessment: 0-10 Pain Score: 4  Pain Location: L knee Pain Descriptors / Indicators: Discomfort Pain Intervention(s): Monitored during session, Limited activity within patient's tolerance, Premedicated before session     PT Goals (current goals can now be found in the care plan section) Acute Rehab PT Goals Patient Stated Goal: get LTC close to my x-wife and daughter Progress towards PT  goals: Progressing toward goals    Frequency    Min 1X/week           Co-evaluation PT/OT/SLP Co-Evaluation/Treatment: Yes Reason for  Co-Treatment: To address functional/ADL transfers;For patient/therapist safety PT goals addressed during session: Mobility/safety with mobility;Balance;Proper use of DME        AM-PAC PT "6 Clicks" Mobility   Outcome Measure  Help needed turning from your back to your side while in a flat bed without using bedrails?: A Lot Help needed moving from lying on your back to sitting on the side of a flat bed without using bedrails?: A Lot Help needed moving to and from a bed to a chair (including a wheelchair)?: A Lot Help needed standing up from a chair using your arms (e.g., wheelchair or bedside chair)?: Total Help needed to walk in hospital room?: Total Help needed climbing 3-5 steps with a railing? : Total 6 Click Score: 9    End of Session   Activity Tolerance: Patient tolerated treatment well;Patient limited by fatigue;Patient limited by pain Patient left: in bed;with call bell/phone within reach Nurse Communication: Mobility status PT Visit Diagnosis: Other abnormalities of gait and mobility (R26.89);Muscle weakness (generalized) (M62.81);Difficulty in walking, not elsewhere classified (R26.2);Pain Pain - Right/Left: Left Pain - part of body: Knee     Time: 1610-9604 PT Time Calculation (min) (ACUTE ONLY): 24 min  Charges:    $Therapeutic Activity: 8-22 mins PT General Charges $$ ACUTE PT VISIT: 1 Visit                     Jetta Lout PTA 11/12/22, 4:26 PM

## 2022-11-13 DIAGNOSIS — R262 Difficulty in walking, not elsewhere classified: Secondary | ICD-10-CM | POA: Diagnosis not present

## 2022-11-13 LAB — GLUCOSE, CAPILLARY
Glucose-Capillary: 149 mg/dL — ABNORMAL HIGH (ref 70–99)
Glucose-Capillary: 191 mg/dL — ABNORMAL HIGH (ref 70–99)
Glucose-Capillary: 175 mg/dL — ABNORMAL HIGH (ref 70–99)
Glucose-Capillary: 213 mg/dL — ABNORMAL HIGH (ref 70–99)

## 2022-11-13 MED ORDER — INSULIN ASPART 100 UNIT/ML IJ SOLN
7.0000 [IU] | Freq: Three times a day (TID) | INTRAMUSCULAR | Status: DC
  Administered 2022-11-14 – 2022-11-15 (×6): 7 [IU] via SUBCUTANEOUS
  Filled 2022-11-13 (×6): qty 1

## 2022-11-13 MED ORDER — PREGABALIN 75 MG PO CAPS
200.0000 mg | ORAL_CAPSULE | Freq: Three times a day (TID) | ORAL | Status: DC
  Administered 2022-11-13 – 2022-11-16 (×11): 200 mg via ORAL
  Filled 2022-11-13 (×11): qty 1

## 2022-11-13 NOTE — Plan of Care (Signed)

## 2022-11-13 NOTE — Progress Notes (Addendum)
PROGRESS NOTE    Russell [name]  [id number]:[id number] DOB: 1965/11/18 DOA: 11/09/2022 PCP: [name], MD  129A/129A-AA  LOS: 0 days   Brief hospital course:   Assessment & Plan: Russell [name] is a 57 y.o. male with medical history significant of recent acute blood loss anemia secondary to upper GI bleed status post EGD and PRBC, chronic combined HFrEF and HFpEF, right BKA, severe external hemorrhoid, IIDM, HTN, HLD, OSA to start home O2, came back from home for multiple complaints including worsening of ambulation impairment.   Patient was just recently hospitalized during 8/24 till 11/08/2022 for recurrent upper GI bleed underwent EGD and PRBC x 4, after which his hemoglobin stabilized in the 7.0-7.5 range.  Patient was also treated for severe internal/external hemorrhoid with rectal prolapse with Anusol cream with some improvement and patient is scheduled to see colorectal surgeon in [facility] Dr. [name] next month.  During hospital stay, PT evaluation recommended patient going to SNF for physical therapy however patient declined.     On presentation patient was having stable vitals and labs.   9/11: Vital stable.  TOC is working on placement as patient is unable to take care of himself.   9/12: Vital stable, CBG remained elevated, making sliding scale moderate and adding 4 units with meal.  Pending SNF placement.  Keep asking for IV opioids which we will currently avoid, added IV Toradol and increasing the dose of Percocet.  * Ambulatory dysfunction Generalized weakness. TOC is working on placement as recommended during prior hospitalization. Patient is now agreeable to go to a rehab.   Type 2 diabetes mellitus with peripheral neuropathy (HCC) CBG elevated. --cont glargine 20u nightly --increase mealtime to 7u TID --SSI  Pain in LE's 2/2 Peripheral neuropathy --increase Lyrica to 200 mg TID --cont Percocet for now --No IV opioids   Hx of Acute urinary  retention Patient was NOT discharged with Foley catheter on 11/08/22.  Pt demanded to have Foley removed prior to leaving the hospital. --pt reported he was able to void  --monitor for retention   Hx of Acute blood loss anemia Recent GI bleed, hemoglobin currently around low 7's. Aspirin and Plavix are on hold.     Morbid obesity (HCC) Estimated body mass index is 53.84 kg/m as calculated from the following:   Height as of this encounter: 5\' 11"  (1.803 m).   Weight as of this encounter: 175.1 kg.    PAD (peripheral artery disease) (HCC) S/p right BKA. -Home aspirin and Plavix are on hold due to recent GI bleed.  Chronic hypoxemic respiratory failure on 2L --pt was discharged on 2L O2 from last hospitalization.    DVT prophylaxis: Lovenox SQ Code Status: Full code  Family Communication:  Level of care: Med-Surg Dispo:   The patient is from: home Anticipated d/c is to: SNF rehab Anticipated d/c date is: whenever bed available   Subjective and Interval History:  Pt continued to complain of leg pain.   Objective: Vitals:   11/12/22 1956 11/13/22 0401 11/13/22 0803 11/13/22 1655  BP: (!) 155/80 138/67 (!) 110/45 (!) 108/49  Pulse: 81 79 70 68  Resp: 18 16 16 20   Temp: 98.5 F (36.9 C) 98.3 F (36.8 C) 98.3 F (36.8 C) 98.6 F (37 C)  TempSrc:   Oral   SpO2: 100% 100% 98% 91%  Weight:      Height:       No intake or output data in the 24 hours ending 11/13/22  1925  Filed Weights   11/09/22 1348 11/09/22 1805  Weight: (!) 176.9 kg (!) 175.1 kg    Examination:   Constitutional: NAD, AAOx3 HEENT: conjunctivae and lids normal, EOMI CV: No cyanosis.   RESP: normal respiratory effort, on 2L Extremities: right BKA SKIN: warm, dry Neuro: II - XII grossly intact.      Data Reviewed: I have personally reviewed labs and imaging studies  Time spent: 50 minutes  [name], MD [facility] If 7PM-7AM, please contact night-coverage 11/13/2022, 7:25 PM

## 2022-11-13 NOTE — TOC Progression Note (Signed)
Transition of Care [facility]) - Progression Note    Patient Details  Name: Russell [name] MRN: [id number] Date of Birth: 1966-02-12  [facility]) CM/SW Contact  Allena Katz, LCSW Phone Number: 11/13/2022, 12:50 PM  Clinical Narrative:   [facility] still pending for facility.     Expected Discharge Plan: Skilled Nursing Facility Barriers to Discharge: Continued Medical Work up  Expected Discharge Plan and Services                                               Social Determinants of Health (SDOH) Interventions SDOH Screenings   Food Insecurity: No Food Insecurity (11/09/2022)  Housing: Low Risk  (11/09/2022)  Transportation Needs: No Transportation Needs (11/09/2022)  Utilities: Not At Risk (11/09/2022)  Financial Resource Strain: Medium Risk (06/17/2022)   Received from [facility]  Tobacco Use: Low Risk  (11/09/2022)    Readmission Risk Interventions    10/25/2022    3:55 PM  Readmission Risk Prevention Plan  Transportation Screening Complete  PCP or Specialist Appt within 3-5 Days Complete  HRI or Home Care Consult Complete  Social Work Consult for Recovery Care Planning/Counseling Complete  Palliative Care Screening Not Applicable  Medication Review Oceanographer) Complete

## 2022-11-14 DIAGNOSIS — R262 Difficulty in walking, not elsewhere classified: Secondary | ICD-10-CM | POA: Diagnosis not present

## 2022-11-14 LAB — GLUCOSE, CAPILLARY
Glucose-Capillary: 151 mg/dL — ABNORMAL HIGH (ref 70–99)
Glucose-Capillary: 122 mg/dL — ABNORMAL HIGH (ref 70–99)
Glucose-Capillary: 121 mg/dL — ABNORMAL HIGH (ref 70–99)
Glucose-Capillary: 166 mg/dL — ABNORMAL HIGH (ref 70–99)

## 2022-11-14 MED ORDER — MELATONIN 5 MG PO TABS
5.0000 mg | ORAL_TABLET | Freq: Every day | ORAL | Status: DC
  Administered 2022-11-14 – 2022-11-15 (×2): 5 mg via ORAL
  Filled 2022-11-14 (×2): qty 1

## 2022-11-14 MED ORDER — ACETAMINOPHEN 500 MG PO TABS
1000.0000 mg | ORAL_TABLET | Freq: Three times a day (TID) | ORAL | Status: DC
  Administered 2022-11-14 – 2022-11-16 (×7): 1000 mg via ORAL
  Filled 2022-11-14 (×7): qty 2

## 2022-11-14 MED ORDER — OXYCODONE HCL 5 MG PO TABS
5.0000 mg | ORAL_TABLET | Freq: Four times a day (QID) | ORAL | Status: DC | PRN
  Administered 2022-11-14 – 2022-11-16 (×4): 5 mg via ORAL
  Filled 2022-11-14 (×5): qty 1

## 2022-11-14 NOTE — Progress Notes (Signed)
PROGRESS NOTE    Russell [name]  ZOX:[id number] DOB: 06/25/1965 DOA: 11/09/2022 PCP: [name], MD  129A/129A-AA  LOS: 0 days   Brief hospital course:   Assessment & Plan: Russell [name] is a 57 y.o. male with medical history significant of recent acute blood loss anemia secondary to upper GI bleed status post EGD and PRBC, chronic combined HFrEF and HFpEF, right BKA, severe external hemorrhoid, IIDM, HTN, HLD, OSA to start home O2, came back from home for multiple complaints including worsening of ambulation impairment.   Patient was just recently hospitalized during 8/24 till 11/08/2022 for recurrent upper GI bleed underwent EGD and PRBC x 4, after which his hemoglobin stabilized in the 7.0-7.5 range.  Patient was also treated for severe internal/external hemorrhoid with rectal prolapse with Anusol cream with some improvement and patient is scheduled to see colorectal surgeon in [facility] Dr. [name] next month.  During hospital stay, PT evaluation recommended patient going to SNF for physical therapy however patient declined.     On presentation patient was having stable vitals and labs.   9/11: Vital stable.  TOC is working on placement as patient is unable to take care of himself.   9/12: Vital stable, CBG remained elevated, making sliding scale moderate and adding 4 units with meal.  Pending SNF placement.  Keep asking for IV opioids which we will currently avoid, added IV Toradol and increasing the dose of Percocet.  * Ambulatory dysfunction Generalized weakness. TOC is working on placement as recommended during prior hospitalization. Patient is now agreeable to go to a rehab.   Type 2 diabetes mellitus with peripheral neuropathy (HCC) CBG elevated. --cont glargine 20u nightly --cont mealtime 7u TID --SSI  Pain in LE's 2/2 Peripheral neuropathy Arthritis  --cont Lyrica to 200 mg TID (increased) --schedule Tylenol 1g TID --change from Percocet to  oxycodone --No IV opioids   Hx of Acute urinary retention Patient was NOT discharged with Foley catheter on 11/08/22.  Pt demanded to have Foley removed prior to leaving the hospital. --pt reported he was able to void  --monitor for retention   Hx of Acute blood loss anemia Recent GI bleed, hemoglobin currently around low 7's. Aspirin and Plavix are on hold.     Morbid obesity (HCC) Estimated body mass index is 53.84 kg/m as calculated from the following:   Height as of this encounter: 5\' 11"  (1.803 m).   Weight as of this encounter: 175.1 kg.    PAD (peripheral artery disease) (HCC) S/p right BKA. -Home aspirin and Plavix are on hold due to recent GI bleed.  Chronic hypoxemic respiratory failure on 2L --pt was discharged on 2L O2 from last hospitalization.    DVT prophylaxis: Lovenox SQ Code Status: Full code  Family Communication:  Level of care: Med-Surg Dispo:   The patient is from: home Anticipated d/c is to: SNF rehab Anticipated d/c date is: whenever bed available   Subjective and Interval History:  Pt complained of stomach upset from possibly something he ate.  RN reported pt now complained of arthritic pain.   Objective: Vitals:   11/13/22 2003 11/14/22 0431 11/14/22 0736 11/14/22 1643  BP: (!) 114/44 129/71 128/64 138/67  Pulse: 71 72 75 75  Resp: 18 18 17 20   Temp: 98.5 F (36.9 C) 98.2 F (36.8 C) 98.5 F (36.9 C) (!) 97.4 F (36.3 C)  TempSrc: Oral Oral    SpO2: 100% 95% (!) 87% 100%  Weight:  Height:       No intake or output data in the 24 hours ending 11/14/22 1813  Filed Weights   11/09/22 1348 11/09/22 1805  Weight: (!) 176.9 kg (!) 175.1 kg    Examination:   Constitutional: NAD, AAOx3 HEENT: conjunctivae and lids normal, EOMI CV: No cyanosis.   RESP: normal respiratory effort, on 2L Extremities: right BKA with prosthetic  Neuro: II - XII grossly intact.     Data Reviewed: I have personally reviewed labs and imaging  studies  Time spent: 35 minutes  [name], MD [facility] If 7PM-7AM, please contact night-coverage 11/14/2022, 6:13 PM

## 2022-11-14 NOTE — Plan of Care (Signed)
Problem: Clinical Measurements: Goal: Will remain free from infection Outcome: Progressing   Problem: Clinical Measurements: Goal: Respiratory complications will improve Outcome: Progressing   Problem: Clinical Measurements: Goal: Cardiovascular complication will be avoided Outcome: Progressing   Problem: Activity: Goal: Risk for activity intolerance will decrease Outcome: Progressing   Problem: Nutrition: Goal: Adequate nutrition will be maintained Outcome: Progressing   Problem: Pain Managment: Goal: General experience of comfort will improve Outcome: Progressing   Problem: Safety: Goal: Ability to remain free from injury will improve Outcome: Progressing

## 2022-11-15 DIAGNOSIS — R262 Difficulty in walking, not elsewhere classified: Secondary | ICD-10-CM | POA: Diagnosis not present

## 2022-11-15 LAB — GLUCOSE, CAPILLARY
Glucose-Capillary: 150 mg/dL — ABNORMAL HIGH (ref 70–99)
Glucose-Capillary: 163 mg/dL — ABNORMAL HIGH (ref 70–99)
Glucose-Capillary: 179 mg/dL — ABNORMAL HIGH (ref 70–99)
Glucose-Capillary: 173 mg/dL — ABNORMAL HIGH (ref 70–99)

## 2022-11-15 MED ORDER — INSULIN ASPART 100 UNIT/ML IJ SOLN
10.0000 [IU] | Freq: Three times a day (TID) | INTRAMUSCULAR | Status: DC
  Administered 2022-11-16 (×2): 10 [IU] via SUBCUTANEOUS
  Filled 2022-11-15 (×2): qty 1

## 2022-11-15 MED ORDER — ORAL CARE MOUTH RINSE: 15.0000 mL | OROMUCOSAL | Status: DC | PRN

## 2022-11-15 MED ORDER — CELECOXIB 200 MG PO CAPS
400.0000 mg | ORAL_CAPSULE | Freq: Two times a day (BID) | ORAL | Status: DC
  Administered 2022-11-15 – 2022-11-16 (×3): 400 mg via ORAL
  Filled 2022-11-15 (×4): qty 2

## 2022-11-15 NOTE — Progress Notes (Signed)
Physical Therapy Treatment Patient Details Name: Russell [name] MRN: [id number] DOB: 07/15/1965 Today's Date: 11/15/2022   History of Present Illness Pt is a 57 year old male presenting to ED with multiple complaints, pt reports he is unable to care for himself at home.  PMH significant for recent acute blood loss anemia secondary to upper GI bleed status post EGD and PRBC, chronic combined HFrEF and HFpEF, right BKA, severe external hemorrhoid, IIDM, HTN, HLD, OSA to start home O2,    PT Comments  Pt received in chair at sink after handoff from OT session. Limited mobility in room to prevent increased Right knee pain and potential for falls due to ill fitting prosthesis. Pt informed that he will not be able to receive a new liner for his prosthesis until after d/c from hospital, per previous therapist's inquiry. Pt also declined using RW for in room gait training and proceeded to "furniture walk". No redness or skin concerns after doffing prosthesis. Will continue to progress as able. Pt awaiting SNF vs LTC placement.   If plan is discharge home, recommend the following: A lot of help with walking and/or transfers;A little help with bathing/dressing/bathroom;Assistance with cooking/housework;Direct supervision/assist for medications management;Direct supervision/assist for financial management;Assist for transportation;Help with stairs or ramp for entrance   Can travel by private vehicle     No  Equipment Recommendations  None recommended by PT    Recommendations for Other Services       Precautions / Restrictions Precautions Precautions: Fall Required Braces or Orthoses: Other Brace Other Brace: R prosthetic in room- Pt is missing his prosthetic liner Restrictions Weight Bearing Restrictions: Yes RLE Weight Bearing: Non weight bearing LLE Weight Bearing: Weight bearing as tolerated Other Position/Activity Restrictions: Pt has been encouraged to limit standing with R prosthesis until  proper fitting can be addressed     Mobility  Bed Mobility Overal bed mobility: Modified Independent Bed Mobility: Sit to Supine       Sit to supine: Modified independent (Device/Increase time), Used rails        Transfers Overall transfer level: Needs assistance Equipment used: None Transfers: Sit to/from Stand Sit to Stand: Contact guard assist, +2 safety/equipment, From elevated surface           General transfer comment:  (Good demonstration of hand placement and weight shift forward.)    Ambulation/Gait Ambulation/Gait assistance: Contact guard assist, Supervision Gait Distance (Feet): 20 Feet Assistive device: None (Pt refused RW, prefers to hold onto furniture despite education on safety and fall risk) Gait Pattern/deviations: Step-through pattern, Decreased step length - left, Decreased stance time - right, Trunk flexed Gait velocity: decreased     General Gait Details: Limited gait to in room after finishing with OT at sink due to ill fitting prosthesis   Stairs             Wheelchair Mobility     Tilt Bed    Modified Rankin (Stroke Patients Only)       Balance Overall balance assessment: Modified Independent Sitting-balance support: Feet supported Sitting balance-Leahy Scale: Normal Sitting balance - Comments: good dynamic sitting balance, completed reaching out of BOS without LOB x 10   Standing balance support: Bilateral upper extremity supported, During functional activity, Reliant on assistive device for balance Standing balance-Leahy Scale: Fair                              Cognition Arousal: Alert Behavior During  Therapy: WFL for tasks assessed/performed Overall Cognitive Status: Within Functional Limits for tasks assessed Area of Impairment: Awareness, Problem solving, Safety/judgement                         Safety/Judgement: Decreased awareness of safety, Decreased awareness of deficits Awareness:  Emergent Problem Solving: Slow processing, Decreased initiation General Comments: Pt is alert and seems to be good historian of past medical history        Exercises General Exercises - Lower Extremity Long Arc Quad: AROM, Both, 10 reps, Seated Other Exercises Other Exercises: Pt educated on benefits of mobility and concerns for skin breakdown and increased knee pain with continued donning of Righ LE prosthesis    General Comments General comments (skin integrity, edema, etc.): Pt SpO2 dropped to 82% after using BR with OT on RA. O2 extension applied to encourage compliance      Pertinent Vitals/Pain Pain Assessment Pain Assessment: No/denies pain    Home Living                          Prior Function            PT Goals (current goals can now be found in the care plan section) Acute Rehab PT Goals Patient Stated Goal: get LTC close to my x-wife and daughter Progress towards PT goals: Progressing toward goals    Frequency    Min 1X/week      PT Plan      Co-evaluation              AM-PAC PT "6 Clicks" Mobility   Outcome Measure  Help needed turning from your back to your side while in a flat bed without using bedrails?: A Lot Help needed moving from lying on your back to sitting on the side of a flat bed without using bedrails?: A Lot Help needed moving to and from a bed to a chair (including a wheelchair)?: A Lot Help needed standing up from a chair using your arms (e.g., wheelchair or bedside chair)?: A Lot Help needed to walk in hospital room?: A Lot Help needed climbing 3-5 steps with a railing? : Total 6 Click Score: 11    End of Session Equipment Utilized During Treatment: Gait belt Activity Tolerance: Patient tolerated treatment well Patient left: in bed;with call bell/phone within reach Nurse Communication: Mobility status PT Visit Diagnosis: Other abnormalities of gait and mobility (R26.89);Muscle weakness (generalized)  (M62.81);Difficulty in walking, not elsewhere classified (R26.2);Pain Pain - Right/Left: Left Pain - part of body: Knee     Time: 1439-1500 PT Time Calculation (min) (ACUTE ONLY): 21 min  Charges:    $Therapeutic Activity: 8-22 mins PT General Charges $$ ACUTE PT VISIT: 1 Visit                    Zadie Cleverly, PTA  Jannet Askew 11/15/2022, 3:51 PM

## 2022-11-15 NOTE — Progress Notes (Signed)
PROGRESS NOTE    DAVARI LEDON  ZOX:[id number] DOB: 12/18/1965 DOA: 11/09/2022 PCP: [name], MD  [id number]/[id number]-AA  LOS: 0 days   Brief hospital course:   Assessment & Plan: FENNER PROBUS is a 57 y.o. male with medical history significant of recent acute blood loss anemia secondary to upper GI bleed status post EGD and PRBC, chronic combined HFrEF and HFpEF, right BKA, severe external hemorrhoid, IIDM, HTN, HLD, OSA to start home O2, came back from home for multiple complaints including worsening of ambulation impairment.   Patient was just recently hospitalized during 8/24 till 11/08/2022 for recurrent upper GI bleed underwent EGD and PRBC x 4, after which his hemoglobin stabilized in the 7.0-7.5 range.  Patient was also treated for severe internal/external hemorrhoid with rectal prolapse with Anusol cream with some improvement and patient is scheduled to see colorectal surgeon in [facility] Dr. [name] next month.  During hospital stay, PT evaluation recommended patient going to SNF for physical therapy however patient declined.     On presentation patient was having stable vitals and labs.   9/11: Vital stable.  TOC is working on placement as patient is unable to take care of himself.   9/12: Vital stable, CBG remained elevated, making sliding scale moderate and adding 4 units with meal.  Pending SNF placement.  Keep asking for IV opioids which we will currently avoid, added IV Toradol and increasing the dose of Percocet.  * Ambulatory dysfunction Generalized weakness. TOC is working on placement as recommended during prior hospitalization. Patient is now agreeable to go to a rehab.   Type 2 diabetes mellitus with peripheral neuropathy (HCC) CBG elevated. --cont glargine 20u nightly --increase mealtime to 10u TID --SSI  Pain in LE's 2/2 Peripheral neuropathy Arthritis  --cont Lyrica to 200 mg TID (increased) --schedule Tylenol 1g TID --schedule Celebrex 400 mg  BID --cont oxycodone PRN --No IV opioids   Hx of Acute urinary retention Patient was NOT discharged with Foley catheter on 11/08/22.  Pt demanded to have Foley removed prior to leaving the hospital. --pt reported he was able to void  --monitor for retention   Hx of Acute blood loss anemia Recent GI bleed, hemoglobin currently around low 7's. Aspirin and Plavix are on hold.     Morbid obesity (HCC) Estimated body mass index is 53.84 kg/m   PAD (peripheral artery disease) (HCC) S/p right BKA. -Home aspirin and Plavix are on hold due to recent GI bleed.  Chronic hypoxemic respiratory failure on 2L --pt was discharged on 2L O2 from last hospitalization. --cont 2L O2    DVT prophylaxis: Lovenox SQ Code Status: Full code  Family Communication:  Level of care: Med-Surg Dispo:   The patient is from: home Anticipated d/c is to: SNF rehab Anticipated d/c date is: whenever bed available   Subjective and Interval History:  Pt complained pain in his left knee due to arthritis.     Objective: Vitals:   11/14/22 1939 11/15/22 0356 11/15/22 0819 11/15/22 1650  BP: (!) 124/54 119/68 122/62 (!) 124/59  Pulse: 86 73 69 73  Resp:   19 17  Temp: 98.3 F (36.8 C) 97.8 F (36.6 C) 98.5 F (36.9 C) (!) 97.5 F (36.4 C)  TempSrc: Oral Oral Oral   SpO2: 93% 100% 99% 100%  Weight:      Height:        Intake/Output Summary (Last 24 hours) at 11/15/2022 1933 Last data filed at 11/15/2022 1454 Gross per 24  hour  Intake 600 ml  Output --  Net 600 ml    Filed Weights   11/09/22 1348 11/09/22 1805  Weight: (!) 176.9 kg (!) 175.1 kg    Examination:   Constitutional: NAD, AAOx3 HEENT: conjunctivae and lids normal, EOMI CV: No cyanosis.   RESP: normal respiratory effort, on 2L Extremities: edema in LLE.  Right BKA SKIN: warm, dry Neuro: II - XII grossly intact.   Psych: Normal mood and affect.  Appropriate judgement and reason   Data Reviewed: I have personally reviewed labs  and imaging studies  Time spent: 35 minutes  [name], MD [facility] If 7PM-7AM, please contact night-coverage 11/15/2022, 7:33 PM

## 2022-11-15 NOTE — TOC Progression Note (Signed)
Transition of Care [facility]) - Progression Note    Patient Details  Name: Russell [name] MRN: [id number] Date of Birth: [birth year]  Transition of Care [facility]) CM/SW Contact  Garret Reddish, RN Phone Number: 11/15/2022, 1:14 PM  Clinical Narrative:   Authorization still pending.   TOC will continue to follow for discharge planning.      Expected Discharge Plan: Skilled Nursing Facility Barriers to Discharge: Continued Medical Work up  Expected Discharge Plan and Services                                               Social Determinants of Health (SDOH) Interventions SDOH Screenings   Food Insecurity: No Food Insecurity (11/09/2022)  Housing: Low Risk  (11/09/2022)  Transportation Needs: No Transportation Needs (11/09/2022)  Utilities: Not At Risk (11/09/2022)  Financial Resource Strain: Medium Risk (06/17/2022)   Received from [facility]  Tobacco Use: Low Risk  (11/09/2022)    Readmission Risk Interventions    10/25/2022    3:55 PM  Readmission Risk Prevention Plan  Transportation Screening Complete  PCP or Specialist Appt within 3-5 Days Complete  HRI or Home Care Consult Complete  Social Work Consult for Recovery Care Planning/Counseling Complete  Palliative Care Screening Not Applicable  Medication Review [employer]) Complete

## 2022-11-15 NOTE — Progress Notes (Signed)
Occupational Therapy Treatment Patient Details Name: Russell [name] MRN: [id number] DOB: 03-18-1965 Today's Date: 11/15/2022   History of present illness Pt is a 57 year old male presenting to ED with multiple complaints, pt reports he is unable to care for himself at home.  PMH significant for recent acute blood loss anemia secondary to upper GI bleed status post EGD and PRBC, chronic combined HFrEF and HFpEF, right BKA, severe external hemorrhoid, IIDM, HTN, HLD, OSA to start home O2,   OT comments  Pt seen for OT tx this date. Pt sleepy but with encouragement agreeable. Pt mod indep with sup>sit EOB and stands with CGA. He refuses the RW to walk to the bathroom from the EOB holding onto the door frame/grab bar/sink for stability. Declines to trial toilet transfer for OT, reporting he can do it. Pt agreeable to RW use after education in falls prevention and uses with SBA-CGA to walk in room to sink. Pt unable to tolerate standing for grooming so he completed seated in chair. Pt removed the nasal cannula at start of sessions prior to ADL/mobility which he reports he has been doing. SpO2 once seated in chair at sink was 82% on room air. Pt eudcated in falls prevention strategies, benefits of RW for safety, risks associated with wearing prosthesis without liner, and need for maintaining O2 on to minimize desats. Extension placed on [place] to improve access. Pt verbalized understanding. Care team (MD, RN, NT, PT) notified of pt's decreased safety awareness and progress of session. Pt continues to benefit from skilled OT services.       If plan is discharge home, recommend the following:  Assistance with cooking/housework;Direct supervision/assist for medications management;Direct supervision/assist for financial management;Assist for transportation;Help with stairs or ramp for entrance;A lot of help with bathing/dressing/bathroom;A little help with walking and/or transfers   Equipment Recommendations        Recommendations for Other Services      Precautions / Restrictions Precautions Precautions: Fall Required Braces or Orthoses: Other Brace Other Brace: R prosthetic in room- Pt is missing his prosthetic liner Restrictions Weight Bearing Restrictions: Yes RLE Weight Bearing: Non weight bearing LLE Weight Bearing: Weight bearing as tolerated Other Position/Activity Restrictions: Pt has been encouraged to limit standing with R prosthesis until proper fitting can be addressed       Mobility Bed Mobility Overal bed mobility: Modified Independent Bed Mobility: Supine to Sit     Supine to sit: Modified independent (Device/Increase time)          Transfers Overall transfer level: Needs assistance Equipment used: None Transfers: Sit to/from Stand Sit to Stand: Contact guard assist                 Balance Overall balance assessment: Needs assistance Sitting-balance support: Feet supported Sitting balance-Leahy Scale: Good     Standing balance support: Bilateral upper extremity supported, During functional activity, Reliant on assistive device for balance Standing balance-Leahy Scale: Fair                             ADL either performed or assessed with clinical judgement   ADL Overall ADL's : Needs assistance/impaired     Grooming: Set up;Supervision/safety;Sitting                     Toilet Transfer Details (indicate cue type and reason): pt declined on this date         Functional mobility  during ADLs: Contact guard assist;Cueing for safety;Rolling walker (2 wheels)      Extremity/Trunk Assessment              Vision       Perception     Praxis      Cognition Arousal: Alert Behavior During Therapy: WFL for tasks assessed/performed Overall Cognitive Status: No family/caregiver present to determine baseline cognitive functioning Area of Impairment: Safety/judgement, Problem solving                          Safety/Judgement: Decreased awareness of safety, Decreased awareness of deficits   Problem Solving: Slow processing, Decreased initiation          Exercises Other Exercises Other Exercises: Pt eudcated in falls prevention strategies, benefits of RW for safety, risks associated with wearing prosthesis without liner, and need for maintaining O2 on to minimize desats.    Shoulder Instructions       General Comments Pt SpO2 dropped to 82% after using BR with OT on RA. O2 extension applied to encourage compliance    Pertinent Vitals/ Pain       Pain Assessment Pain Assessment: No/denies pain  Home Living                                          Prior Functioning/Environment              Frequency  Min 1X/week        Progress Toward Goals  OT Goals(current goals can now be found in the care plan section)  Progress towards OT goals: Progressing toward goals  Acute Rehab OT Goals Patient Stated Goal: go to LTC OT Goal Formulation: With patient Time For Goal Achievement: 11/26/22 Potential to Achieve Goals: Good  Plan      Co-evaluation                 AM-PAC OT "6 Clicks" Daily Activity     Outcome Measure   Help from another person eating meals?: None Help from another person taking care of personal grooming?: None Help from another person toileting, which includes using toliet, bedpan, or urinal?: A Lot Help from another person bathing (including washing, rinsing, drying)?: A Little Help from another person to put on and taking off regular upper body clothing?: A Little Help from another person to put on and taking off regular lower body clothing?: A Lot 6 Click Score: 18    End of Session Equipment Utilized During Treatment: Rolling walker (2 wheels);Oxygen  OT Visit Diagnosis: Muscle weakness (generalized) (M62.81);Other abnormalities of gait and mobility (R26.89)   Activity Tolerance Patient tolerated treatment well    Patient Left in chair;with nursing/sitter in room (seated in chair at sink with PT)   Nurse Communication Mobility status (MD, RN, NT, PT poor safety awareness, refusing RW and assist)        Time: 1610-9604 OT Time Calculation (min): 12 min  Charges: OT General Charges $OT Visit: 1 Visit OT Treatments $Self Care/Home Management : 8-22 mins  Arman Filter., MPH, MS, OTR/L ascom [phone] 11/15/22, 4:55 PM

## 2022-11-15 NOTE — Progress Notes (Signed)
Patient c/o "leg pain." Informed patient he just received Lyrica + 1000mg  Tylenol, asked if he wanted to wait to let that kick in. Patient c/o arthritis and needing "that t medicine, torres." Patient offered Toradol; informed this medication will discontinue after this dose. Patient voiced understanding.

## 2022-11-16 DIAGNOSIS — R262 Difficulty in walking, not elsewhere classified: Secondary | ICD-10-CM | POA: Diagnosis not present

## 2022-11-16 LAB — CBC
HCT: 27.8 % — ABNORMAL LOW (ref 39.0–52.0)
Hemoglobin: 8.1 g/dL — ABNORMAL LOW (ref 13.0–17.0)
MCH: 23.3 pg — ABNORMAL LOW (ref 26.0–34.0)
MCHC: 29.1 g/dL — ABNORMAL LOW (ref 30.0–36.0)
MCV: 80.1 fL (ref 80.0–100.0)
Platelets: 229 10*3/uL (ref 150–400)
RBC: 3.47 MIL/uL — ABNORMAL LOW (ref 4.22–5.81)
RDW: 19 % — ABNORMAL HIGH (ref 11.5–15.5)
WBC: 4.1 10*3/uL (ref 4.0–10.5)
nRBC: 0 % (ref 0.0–0.2)

## 2022-11-16 LAB — BASIC METABOLIC PANEL
Anion gap: 9 (ref 5–15)
BUN: 18 mg/dL (ref 6–20)
CO2: 29 mmol/L (ref 22–32)
Calcium: 8.4 mg/dL — ABNORMAL LOW (ref 8.9–10.3)
Chloride: 101 mmol/L (ref 98–111)
Creatinine, Ser: 1 mg/dL (ref 0.61–1.24)
GFR, Estimated: >60 mL/min (ref >60)
Glucose, Bld: 212 mg/dL — ABNORMAL HIGH (ref 70–99)
Potassium: 4.2 mmol/L (ref 3.5–5.1)
Sodium: 139 mmol/L (ref 135–145)

## 2022-11-16 LAB — MAGNESIUM: Magnesium: 2.3 mg/dL (ref 1.7–2.4)

## 2022-11-16 LAB — GLUCOSE, CAPILLARY
Glucose-Capillary: 196 mg/dL — ABNORMAL HIGH (ref 70–99)
Glucose-Capillary: 184 mg/dL — ABNORMAL HIGH (ref 70–99)
Glucose-Capillary: 166 mg/dL — ABNORMAL HIGH (ref 70–99)

## 2022-11-16 MED ORDER — ACETAMINOPHEN 500 MG PO TABS: 1000.0000 mg | ORAL_TABLET | Freq: Three times a day (TID) | ORAL | Status: AC

## 2022-11-16 MED ORDER — INSULIN DETEMIR 100 UNIT/ML FLEXPEN
20.0000 [IU] | PEN_INJECTOR | Freq: Two times a day (BID) | SUBCUTANEOUS | Status: DC
Start: 2022-11-16 — End: 2023-03-03

## 2022-11-16 MED ORDER — NITROFURANTOIN MONOHYD MACRO 100 MG PO CAPS
100.0000 mg | ORAL_CAPSULE | Freq: Two times a day (BID) | ORAL | Status: DC
  Administered 2022-11-16: 100 mg via ORAL
  Filled 2022-11-16: qty 1

## 2022-11-16 MED ORDER — CELECOXIB 400 MG PO CAPS: 400.0000 mg | ORAL_CAPSULE | Freq: Two times a day (BID) | ORAL | Status: DC

## 2022-11-16 MED ORDER — POLYETHYLENE GLYCOL 3350 17 G PO PACK: 17.0000 g | PACK | Freq: Every day | ORAL | Status: AC | PRN

## 2022-11-16 MED ORDER — OXYCODONE HCL 5 MG PO TABS: 5.0000 mg | ORAL_TABLET | Freq: Four times a day (QID) | ORAL | 0 refills | Status: AC | PRN

## 2022-11-16 MED ORDER — FE FUM-VIT C-VIT B12-FA 460-60-0.01-1 MG PO CAPS: 1.0000 | ORAL_CAPSULE | Freq: Two times a day (BID) | ORAL | Status: DC

## 2022-11-16 MED ORDER — PREGABALIN 200 MG PO CAPS: 200.0000 mg | ORAL_CAPSULE | Freq: Three times a day (TID) | ORAL | Status: DC

## 2022-11-16 MED ORDER — HUMULIN R 100 UNIT/ML IJ SOLN: 10.0000 [IU] | Freq: Three times a day (TID) | INTRAMUSCULAR | Status: AC

## 2022-11-16 MED ORDER — MELATONIN 5 MG PO TABS: 5.0000 mg | ORAL_TABLET | Freq: Every day | ORAL | Status: AC

## 2022-11-16 MED ORDER — CELECOXIB 400 MG PO CAPS: 400.0000 mg | ORAL_CAPSULE | Freq: Two times a day (BID) | ORAL | Status: AC

## 2022-11-16 MED ORDER — NITROFURANTOIN MONOHYD MACRO 100 MG PO CAPS: 100.0000 mg | ORAL_CAPSULE | Freq: Two times a day (BID) | ORAL | Status: AC

## 2022-11-16 NOTE — Discharge Summary (Signed)
Physician Discharge Summary   Russell [name]  male DOB: Jul 12, 1965  NFA:[id number]  PCP: [name], MD  Admit date: 11/09/2022 Discharge date: 11/16/2022  Admitted From: home Disposition:  SNF rehab CODE STATUS: Full code  Discharge Instructions     No wound care   Complete by: As directed       Hospital Course:  For full details, please see H&P, progress notes, consult notes and ancillary notes.  Briefly,  Russell [name] is a 57 y.o. male with medical history significant of recent acute blood loss anemia secondary to upper GI bleed status post EGD and pRBC, chronic combined CHF, right BKA, severe external hemorrhoid, IIDM, HTN, OSA to start home O2, came back from home for multiple complaints including worsening of ambulation impairment.   Patient was just recently hospitalized during 8/24 till 11/08/2022 for recurrent upper GI bleed underwent EGD and PRBC x 4, after which his hemoglobin stabilized in the 7.0-7.5 range.  Patient was also treated for severe internal/external hemorrhoid with rectal prolapse with Anusol cream with some improvement and patient is scheduled to see colorectal surgeon in [facility] Dr. [name] next month.  During previous hospital stay, PT evaluation recommended patient going to SNF for physical therapy however patient declined.     * Ambulatory dysfunction Generalized weakness. --SNF rehab   Type 2 diabetes mellitus with peripheral neuropathy (HCC) With hyperglycemia --A1c 12 about a month ago. --based on hospital usage, pt is discharged on Levemir 20u BID and Humulin 10 TID with meals.  Pain in LE's 2/2 Peripheral neuropathy Arthritis  --Pt reported IV toradol helped the most with his pain, however, pt already received 5 days of PRN Toradol, and given hx of recent GI bleed and finding of duodenal ulcers, toradol was not continued. --cont Lyrica to 200 mg TID (increased from 150 TID) --cont schedule Tylenol 1g TID --cont  schedule Celebrex 400 mg BID for 14 days --cont oxycodone PRN   Hx of Acute urinary retention Patient was NOT discharged with Foley catheter on 11/08/22.  Pt demanded to have Foley removed prior to leaving the hospital at that time. --pt reported he was able to void during this hospitalization. --monitor for retention   Hx of Acute blood loss anemia Recent GI bleed.  Hgb currently stable and improved to 8.1 prior to discharge. Aspirin and Plavix are on hold.     Morbid obesity (HCC) Estimated body mass index is 53.84 kg/m  --discussed with pt and encouraged pt to establish with weight loss clinic as outpatient.   PAD (peripheral artery disease) (HCC) S/p right BKA. -Home aspirin and Plavix are on hold due to recent GI bleed. --Per our PT, pt should have a sleeve while using his prosthetic.   Chronic hypoxemic respiratory failure on 2L --pt was discharged on 2L O2 from last hospitalization, and he did desat in room air with exertion without supplemental O2. --cont 2L O2  Dysuria --on the day of discharge, pt first reported burning with urination.  Pt was not able to produce an urine sample for UA, and asked to be empirically treated.  Pt was started on Macrobid, and can continue a 7-day course.  If urinary symptom does not improve, then will need to collect urine at the SNF for culture.  Internal hemorrhoids/external hemorrhoid Rectal Prolapse, small --this was an active issue during last hospitalization and pt was discharged on Anusol suppository until 11/19/22, however, pt has been refusing.   --outpatient colorectal surgery evaluation. Already  set up with Dr. Romie Levee office in [place] and has appointment for December 07, 2022 at 11:30 AM    Discharge Diagnoses:  Principal Problem:   Ambulatory dysfunction Active Problems:   Generalized weakness   Type 2 diabetes mellitus with peripheral neuropathy (HCC)   Acute urinary retention   Acute blood loss anemia   Morbid  obesity (HCC)   PAD (peripheral artery disease) (HCC)   30 Day Unplanned Readmission Risk Score    Flowsheet Row ED to Hosp-Admission (Discharged) from 10/23/2022 in [facility] (1A)  30 Day Unplanned Readmission Risk Score (%) 27.94 Filed at 11/08/2022 1600       This score is the patient's risk of an unplanned readmission within 30 days of being discharged (0 -100%). The score is based on dignosis, age, lab data, medications, orders, and past utilization.   Low:  0-14.9   Medium: 15-21.9   High: 22-29.9   Extreme: 30 and above         Discharge Instructions:  Allergies as of 11/16/2022       Reactions   Fluoxetine    Other reaction(s): Hallucination   Shellfish Allergy Hives   Sulfa Antibiotics Hives   Vancomycin    Other reaction(s): Red Man Syndrome   Metformin Nausea Only        Medication List     STOP taking these medications    collagenase 250 UNIT/GM ointment Commonly known as: SANTYL   hydrocortisone 2.5 % rectal cream Commonly known as: ANUSOL-HC   hydrocortisone 25 MG suppository Commonly known as: ANUSOL-HC   oxyCODONE-acetaminophen 5-325 MG tablet Commonly known as: PERCOCET/ROXICET   potassium chloride SA 20 MEQ tablet Commonly known as: KLOR-CON M       TAKE these medications    acetaminophen 500 MG tablet Commonly known as: TYLENOL Take 2 tablets (1,000 mg total) by mouth 3 (three) times daily for 5 days.   atorvastatin 40 MG tablet Commonly known as: LIPITOR Take 40 mg by mouth daily.   buPROPion 150 MG 24 hr tablet Commonly known as: WELLBUTRIN XL Take 1 tablet (150 mg total) by mouth daily.   celecoxib 400 MG capsule Commonly known as: CELEBREX Take 1 capsule (400 mg total) by mouth 2 (two) times daily for 14 days.   cetirizine 10 MG tablet Commonly known as: ZYRTEC Take 10 mg by mouth daily as needed for allergies.   DULoxetine HCl 30 MG Csdr Take 30 mg by mouth in the morning, at  noon, and at bedtime.   Fe Fum-Vit C-Vit B12-FA Caps capsule Commonly known as: TRIGELS-F FORTE Take 1 capsule by mouth 2 (two) times daily.   furosemide 40 MG tablet Commonly known as: LASIX Take 1 tablet (40 mg total) by mouth 2 (two) times daily.   HumuLIN R 100 UNIT/ML injection Generic drug: insulin regular Inject 0.1 mLs (10 Units total) into the skin 3 (three) times daily with meals. What changed:  how much to take when to take this   insulin detemir 100 UNIT/ML FlexPen Commonly known as: LEVEMIR Inject 20 Units into the skin 2 (two) times daily. What changed: how much to take   Insulin Pen Needle 33G X 5 MM Misc 1 Dose by Does not apply route 2 (two) times daily.   melatonin 5 MG Tabs Take 1 tablet (5 mg total) by mouth at bedtime.   nitrofurantoin (macrocrystal-monohydrate) 100 MG capsule Commonly known as: MACROBID Take 1 capsule (100 mg total) by mouth every  12 (twelve) hours for 7 days.   oxyCODONE 5 MG immediate release tablet Commonly known as: Oxy IR/ROXICODONE Take 1 tablet (5 mg total) by mouth every 6 (six) hours as needed for up to 7 days for moderate pain or severe pain.   Ozempic (1 MG/DOSE) 4 MG/3ML Sopn Generic drug: Semaglutide (1 MG/DOSE) Inject 1 mg into the skin once a week.   pantoprazole 40 MG tablet Commonly known as: PROTONIX Take 1 tablet (40 mg total) by mouth 2 (two) times daily.   polyethylene glycol 17 g packet Commonly known as: MIRALAX / GLYCOLAX Take 17 g by mouth daily as needed.   pregabalin 200 MG capsule Commonly known as: LYRICA Take 1 capsule (200 mg total) by mouth 3 (three) times daily. What changed:  medication strength how much to take   senna-docusate 8.6-50 MG tablet Commonly known as: Senokot-S Take 2 tablets by mouth daily as needed for mild constipation.   tamsulosin 0.4 MG Caps capsule Commonly known as: FLOMAX Take 1 capsule (0.4 mg total) by mouth daily after breakfast.         Contact  information for follow-up providers     [name], MD Follow up.   Specialty: Family Medicine Contact information: [address] [place] [address] [phone]              Contact information for after-discharge care     Destination     [facility] .   Service: Skilled Nursing Contact information: [address] [place] [address] [phone]                     Allergies  Allergen Reactions   Fluoxetine     Other reaction(s): Hallucination   Shellfish Allergy Hives   Sulfa Antibiotics Hives   Vancomycin     Other reaction(s): Red Man Syndrome   Metformin Nausea Only     The results of significant diagnostics from this hospitalization (including imaging, microbiology, ancillary and laboratory) are listed below for reference.   Consultations:   Procedures/Studies: DG Knee Complete 4 Views Left  Result Date: 11/03/2022 CLINICAL DATA:  [id number] Knee pain, left 109323 x1.5 months without trauma EXAM: LEFT KNEE - COMPLETE 4+ VIEW COMPARISON:  08/18/2017 FINDINGS: No fracture or dislocation. No effusion. Mild tricompartmental marginal spurring and narrowing of articular cartilage. Alignment preserved. Normal mineralization. Regional soft tissues unremarkable. IMPRESSION: Mild tricompartmental degenerative changes. No acute findings. Electronically Signed   By: Corlis Leak M.D.   On: 11/03/2022 17:57   CT ABDOMEN PELVIS W CONTRAST  Result Date: 10/24/2022 CLINICAL DATA:  Blood loss. EXAM: CT ABDOMEN AND PELVIS WITH CONTRAST TECHNIQUE: Multidetector CT imaging of the abdomen and pelvis was performed using the standard protocol following bolus administration of intravenous contrast. RADIATION DOSE REDUCTION: This exam was performed according to the departmental dose-optimization program which includes automated exposure control, adjustment of the mA and/or kV according to patient size and/or use of iterative  reconstruction technique. CONTRAST:  OMNIPAQUE IOHEXOL 300 MG/ML  SOLN COMPARISON:  CT 10/12/2022. FINDINGS: Lower chest: There is some linear opacity lung bases likely scar or atelectasis. No pleural effusion. Coronary artery calcifications are seen. There are some prominent nodes identified along the mediastinum at the edge of the imaging field, unchanged from previous. Stable nodular area of thickening along the subcutaneous fat along the anterior aspect of the lower chest wall right of midline on series 2, image 20 Hepatobiliary: No focal liver  abnormality is seen. No gallstones, gallbladder wall thickening, or biliary dilatation. Patent portal vein. Pancreas: Severe atrophy of the pancreas. Spleen: Normal in size without focal abnormality. Adrenals/Urinary Tract: Adrenal glands are preserved. No enhancing renal mass or collecting system dilatation. The ureters have normal course and caliber down to the bladder. The right kidney is malrotated, a congenital variant distended urinary bladder Stomach/Bowel: No oral contrast. The large bowel has a normal course and caliber with some scattered stool. Appendix is not seen. There is some surgical changes along the posterior aspect of the cecum. The stomach and small bowel are nondilated. Vascular/Lymphatic: Normal caliber aorta and IVC with mild vascular calcifications. There are some small retroperitoneal nodes identified, nonpathologic by size criteria. Similar nodes along the pelvic sidewall regions. These are unchanged from previous. Reproductive: Prostate is unremarkable. Other: No retroperitoneal hematoma. No ascites or fluid collection. There is anasarca. Additional skin thickening with stranding along the anterior pelvic wall the level of the perineum, increasing from the prior examination. Please correlate with clinical findings such as cellulitis. Small fat containing umbilical hernia. No soft tissue gas seen. Musculoskeletal: Moderate degenerative  changes along the spine with some disc bulging and osteophytes. Degenerative changes along the pelvis. IMPRESSION: No retroperitoneal hematoma identified.  No ascites or free air. No bowel obstruction. Stable subcutaneous area of thickening and nodularity along the anterior abdominal, lower chest wall right of midline. Increasing skin thickening with some stranding along the anterior low pelvis, perineal region. Please correlate with clinical findings. Electronically Signed   By: Karen Kays M.D.   On: 10/24/2022 14:54   DG Chest Portable 1 View  Result Date: 10/23/2022 CLINICAL DATA:  Shortness of breath and chest pain.  History of CHF. EXAM: PORTABLE CHEST 1 VIEW COMPARISON:  Most recent radiograph 10/12/2022 FINDINGS: Cardiomegaly is stable. Vascular congestion is unchanged. There is improving left lung base opacity. No new airspace disease. No large pleural effusion on this AP view. No pneumothorax. IMPRESSION: 1. Stable cardiomegaly and vascular congestion. 2. Improving left lung base opacity. Electronically Signed   By: Narda Rutherford M.D.   On: 10/23/2022 23:17      Labs: BNP (last 3 results) Recent Labs    10/12/22 2216 10/23/22 2242  BNP 47.0 190.6*   Basic Metabolic Panel: Recent Labs  Lab 11/16/22 0521  NA 139  K 4.2  CL 101  CO2 29  GLUCOSE 212*  BUN 18  CREATININE 1.00  CALCIUM 8.4*  MG 2.3   Liver Function Tests: No results for input(s): "AST", "ALT", "ALKPHOS", "BILITOT", "PROT", "ALBUMIN" in the last 168 hours. No results for input(s): "LIPASE", "AMYLASE" in the last 168 hours. No results for input(s): "AMMONIA" in the last 168 hours. CBC: Recent Labs  Lab 11/16/22 0521  WBC 4.1  HGB 8.1*  HCT 27.8*  MCV 80.1  PLT 229   Cardiac Enzymes: No results for input(s): "CKTOTAL", "CKMB", "CKMBINDEX", "TROPONINI" in the last 168 hours. BNP: Invalid input(s): "POCBNP" CBG: Recent Labs  Lab 11/15/22 1216 11/15/22 1650 11/15/22 2038 11/16/22 0817  11/16/22 1137  GLUCAP 163* 179* 173* 196* 184*   D-Dimer No results for input(s): "DDIMER" in the last 72 hours. Hgb A1c No results for input(s): "HGBA1C" in the last 72 hours. Lipid Profile No results for input(s): "CHOL", "HDL", "LDLCALC", "TRIG", "CHOLHDL", "LDLDIRECT" in the last 72 hours. Thyroid function studies No results for input(s): "TSH", "T4TOTAL", "T3FREE", "THYROIDAB" in the last 72 hours.  Invalid input(s): "FREET3" Anemia work up No results for input(s): "  VITAMINB12", "FOLATE", "FERRITIN", "TIBC", "IRON", "RETICCTPCT" in the last 72 hours. Urinalysis    Component Value Date/Time   COLORURINE YELLOW (A) 11/20/2016 1307   APPEARANCEUR CLEAR (A) 11/20/2016 1307   APPEARANCEUR Clear 08/13/2013 1624   LABSPEC 1.022 11/20/2016 1307   LABSPEC 1.012 08/13/2013 1624   PHURINE 6.0 11/20/2016 1307   GLUCOSEU >=500 (A) 11/20/2016 1307   GLUCOSEU 150 mg/dL 33/29/5188 4166   HGBUR SMALL (A) 11/20/2016 1307   BILIRUBINUR NEGATIVE 11/20/2016 1307   BILIRUBINUR Negative 08/13/2013 1624   KETONESUR 5 (A) 11/20/2016 1307   PROTEINUR NEGATIVE 11/20/2016 1307   NITRITE NEGATIVE 11/20/2016 1307   LEUKOCYTESUR NEGATIVE 11/20/2016 1307   LEUKOCYTESUR Negative 08/13/2013 1624   Sepsis Labs Recent Labs  Lab 11/16/22 0521  WBC 4.1   Microbiology No results found for this or any previous visit (from the past 240 hour(s)).   Total time spend on discharging this patient, including the last patient exam, discussing the hospital stay, instructions for ongoing care as it relates to all pertinent caregivers, as well as preparing the medical discharge records, prescriptions, and/or referrals as applicable, is 40 minutes.    [name], MD  [facility] 11/16/2022, 4:04 PM

## 2022-11-16 NOTE — TOC Transition Note (Signed)
Transition of Care [facility]) - CM/SW Discharge Note   Patient Details  Name: MUNEER GURGANIOUS MRN: [id number] Date of Birth: 09-20-1965  Transition of [facility] CM/SW Contact:  Garret Reddish, RN Phone Number: 11/16/2022, 4:02 PM   Clinical Narrative:     Chart reviewed.  Noted that patient has been approved for SNF.  Plan auth ID is [id number] Auth ID- [id number].  Patient is approved from 11-12-22-11-16-22.  Next review date is 11-16-2022.  I have spoken with Charlcie Cradle, Admission Coordinator with [facility]. He informs me that he will have a bed for the patient today.    I have spoken with Mr. Carrick and  informed him that he will have a bed today at [facility].  Charlcie Cradle reports that patient will go to room 111B and the number to call report will be [phone].    I have arranged EMS transport via [facility].    I have informed staff nurse of the above information.      Final next level of care: Skilled Nursing Facility Barriers to Discharge: No Barriers Identified   Patient Goals and CMS Choice CMS Medicare.gov Compare Post Acute Care list provided to:: Patient Choice offered to / list presented to : Patient  Discharge Placement                Patient [facility] bed at:  [facility]) Patient to be transferred to facility by: [facility]   Patient and family notified of of transfer: 11/16/22  Discharge Plan and Services Additional resources added to the After Visit Summary for                                       Social Determinants of Health (SD [facility]) Interventions SDOH Screenings   Food Insecurity: No Food Insecurity (11/09/2022)  Housing: Low Risk  (11/09/2022)  Transportation Needs: No Transportation Needs (11/09/2022)  Utilities: Not At Risk (11/09/2022)  Financial Resource Strain: Medium Risk (06/17/2022)   Received from [facility]  Tobacco Use: Low Risk  (11/09/2022)     Readmission Risk Interventions     10/25/2022    3:55 PM  Readmission Risk Prevention Plan  Transportation Screening Complete  PCP or Specialist Appt within 3-5 Days Complete  HRI or Home Care Consult Complete  Social Work Consult for Recovery Care Planning/Counseling Complete  Palliative Care Screening Not Applicable  Medication Review Oceanographer) Complete

## 2022-11-16 NOTE — Progress Notes (Signed)
[facility] @ [phone].  Spoke to [place], Charity fundraiser reported pt had not received insulin or dinner. Pts belongings were sent with him via EMS and IV was removed.

## 2022-11-16 NOTE — Plan of Care (Signed)
  Problem: Education: Goal: Ability to describe self-care measures that may prevent or decrease complications (Diabetes Survival Skills Education) will improve Outcome: Progressing   Problem: Cardiac: Goal: Ability to maintain an adequate cardiac output will improve Outcome: Progressing   Problem: Metabolic: Goal: Ability to maintain appropriate glucose levels will improve Outcome: Progressing   Problem: Urinary Elimination: Goal: Ability to achieve and maintain adequate renal perfusion and functioning will improve Outcome: Progressing

## 2022-11-22 ENCOUNTER — Inpatient Hospital Stay: Payer: 59 | Admitting: Surgery

## 2022-11-29 ENCOUNTER — Ambulatory Visit: Payer: 59 | Admitting: Physician Assistant

## 2022-11-29 ENCOUNTER — Ambulatory Visit: Payer: 59 | Admitting: Urology

## 2022-11-30 ENCOUNTER — Encounter: Payer: Self-pay | Admitting: Urology

## 2022-11-30 ENCOUNTER — Encounter: Payer: Self-pay | Admitting: Physician Assistant

## 2023-02-16 ENCOUNTER — Encounter: Payer: Self-pay | Admitting: Internal Medicine

## 2023-02-26 ENCOUNTER — Emergency Department: Payer: 59

## 2023-02-26 ENCOUNTER — Other Ambulatory Visit: Payer: Self-pay

## 2023-02-26 DIAGNOSIS — Z91013 Allergy to seafood: Secondary | ICD-10-CM

## 2023-02-26 DIAGNOSIS — L02512 Cutaneous abscess of left hand: Secondary | ICD-10-CM | POA: Diagnosis not present

## 2023-02-26 DIAGNOSIS — Z825 Family history of asthma and other chronic lower respiratory diseases: Secondary | ICD-10-CM

## 2023-02-26 DIAGNOSIS — Z635 Disruption of family by separation and divorce: Secondary | ICD-10-CM

## 2023-02-26 DIAGNOSIS — Z794 Long term (current) use of insulin: Secondary | ICD-10-CM

## 2023-02-26 DIAGNOSIS — Z8249 Family history of ischemic heart disease and other diseases of the circulatory system: Secondary | ICD-10-CM

## 2023-02-26 DIAGNOSIS — K219 Gastro-esophageal reflux disease without esophagitis: Secondary | ICD-10-CM | POA: Diagnosis present

## 2023-02-26 DIAGNOSIS — Z833 Family history of diabetes mellitus: Secondary | ICD-10-CM

## 2023-02-26 DIAGNOSIS — Z8051 Family history of malignant neoplasm of kidney: Secondary | ICD-10-CM

## 2023-02-26 DIAGNOSIS — L03012 Cellulitis of left finger: Secondary | ICD-10-CM | POA: Diagnosis present

## 2023-02-26 DIAGNOSIS — Z89611 Acquired absence of right leg above knee: Secondary | ICD-10-CM

## 2023-02-26 DIAGNOSIS — Z79899 Other long term (current) drug therapy: Secondary | ICD-10-CM

## 2023-02-26 DIAGNOSIS — Z993 Dependence on wheelchair: Secondary | ICD-10-CM

## 2023-02-26 DIAGNOSIS — I5042 Chronic combined systolic (congestive) and diastolic (congestive) heart failure: Secondary | ICD-10-CM | POA: Diagnosis present

## 2023-02-26 DIAGNOSIS — Z882 Allergy status to sulfonamides status: Secondary | ICD-10-CM

## 2023-02-26 DIAGNOSIS — E1165 Type 2 diabetes mellitus with hyperglycemia: Secondary | ICD-10-CM | POA: Diagnosis present

## 2023-02-26 DIAGNOSIS — Z888 Allergy status to other drugs, medicaments and biological substances status: Secondary | ICD-10-CM

## 2023-02-26 DIAGNOSIS — Z881 Allergy status to other antibiotic agents status: Secondary | ICD-10-CM

## 2023-02-26 DIAGNOSIS — M65942 Unspecified synovitis and tenosynovitis, left hand: Secondary | ICD-10-CM | POA: Diagnosis present

## 2023-02-26 DIAGNOSIS — E869 Volume depletion, unspecified: Secondary | ICD-10-CM | POA: Diagnosis present

## 2023-02-26 DIAGNOSIS — E782 Mixed hyperlipidemia: Secondary | ICD-10-CM | POA: Diagnosis present

## 2023-02-26 DIAGNOSIS — I96 Gangrene, not elsewhere classified: Secondary | ICD-10-CM | POA: Diagnosis present

## 2023-02-26 DIAGNOSIS — F32A Depression, unspecified: Secondary | ICD-10-CM | POA: Diagnosis present

## 2023-02-26 DIAGNOSIS — L03114 Cellulitis of left upper limb: Secondary | ICD-10-CM | POA: Diagnosis present

## 2023-02-26 DIAGNOSIS — E1142 Type 2 diabetes mellitus with diabetic polyneuropathy: Secondary | ICD-10-CM | POA: Diagnosis present

## 2023-02-26 DIAGNOSIS — I110 Hypertensive heart disease with heart failure: Secondary | ICD-10-CM | POA: Diagnosis present

## 2023-02-26 DIAGNOSIS — Z23 Encounter for immunization: Secondary | ICD-10-CM

## 2023-02-26 DIAGNOSIS — Z5986 Financial insecurity: Secondary | ICD-10-CM

## 2023-02-26 DIAGNOSIS — Z91199 Patient's noncompliance with other medical treatment and regimen due to unspecified reason: Secondary | ICD-10-CM

## 2023-02-26 DIAGNOSIS — E1151 Type 2 diabetes mellitus with diabetic peripheral angiopathy without gangrene: Secondary | ICD-10-CM | POA: Diagnosis present

## 2023-02-26 DIAGNOSIS — N179 Acute kidney failure, unspecified: Secondary | ICD-10-CM | POA: Diagnosis present

## 2023-02-26 DIAGNOSIS — Z89511 Acquired absence of right leg below knee: Secondary | ICD-10-CM

## 2023-02-26 DIAGNOSIS — E1152 Type 2 diabetes mellitus with diabetic peripheral angiopathy with gangrene: Secondary | ICD-10-CM | POA: Diagnosis present

## 2023-02-26 DIAGNOSIS — E871 Hypo-osmolality and hyponatremia: Secondary | ICD-10-CM | POA: Diagnosis present

## 2023-02-26 DIAGNOSIS — N400 Enlarged prostate without lower urinary tract symptoms: Secondary | ICD-10-CM | POA: Diagnosis present

## 2023-02-26 DIAGNOSIS — Z89022 Acquired absence of left finger(s): Secondary | ICD-10-CM

## 2023-02-26 DIAGNOSIS — E878 Other disorders of electrolyte and fluid balance, not elsewhere classified: Secondary | ICD-10-CM | POA: Diagnosis present

## 2023-02-26 DIAGNOSIS — Z7985 Long-term (current) use of injectable non-insulin antidiabetic drugs: Secondary | ICD-10-CM

## 2023-02-26 DIAGNOSIS — F419 Anxiety disorder, unspecified: Secondary | ICD-10-CM | POA: Diagnosis present

## 2023-02-26 DIAGNOSIS — Z6841 Body Mass Index (BMI) 40.0 and over, adult: Secondary | ICD-10-CM

## 2023-02-26 LAB — CBC WITH DIFFERENTIAL/PLATELET
Abs Immature Granulocytes: 0.08 10*3/uL — ABNORMAL HIGH (ref 0.00–0.07)
Basophils Absolute: 0 10*3/uL (ref 0.0–0.1)
Basophils Relative: 0 %
Eosinophils Absolute: 0.2 10*3/uL (ref 0.0–0.5)
Eosinophils Relative: 1 %
HCT: 38.6 % — ABNORMAL LOW (ref 39.0–52.0)
Hemoglobin: 11.9 g/dL — ABNORMAL LOW (ref 13.0–17.0)
Immature Granulocytes: 1 %
Lymphocytes Relative: 6 %
Lymphs Abs: 1.1 10*3/uL (ref 0.7–4.0)
MCH: 21.4 pg — ABNORMAL LOW (ref 26.0–34.0)
MCHC: 30.8 g/dL (ref 30.0–36.0)
MCV: 69.5 fL — ABNORMAL LOW (ref 80.0–100.0)
Monocytes Absolute: 1.2 10*3/uL — ABNORMAL HIGH (ref 0.1–1.0)
Monocytes Relative: 7 %
Neutro Abs: 14.1 10*3/uL — ABNORMAL HIGH (ref 1.7–7.7)
Neutrophils Relative %: 85 %
Platelets: 268 10*3/uL (ref 150–400)
RBC: 5.55 MIL/uL (ref 4.22–5.81)
RDW: 17.4 % — ABNORMAL HIGH (ref 11.5–15.5)
WBC: 16.7 10*3/uL — ABNORMAL HIGH (ref 4.0–10.5)
nRBC: 0 % (ref 0.0–0.2)

## 2023-02-26 LAB — COMPREHENSIVE METABOLIC PANEL
ALT: 16 U/L (ref 0–44)
AST: 22 U/L (ref 15–41)
Albumin: 3.4 g/dL — ABNORMAL LOW (ref 3.5–5.0)
Alkaline Phosphatase: 128 U/L — ABNORMAL HIGH (ref 38–126)
Anion gap: 13 (ref 5–15)
BUN: 39 mg/dL — ABNORMAL HIGH (ref 6–20)
CO2: 27 mmol/L (ref 22–32)
Calcium: 8.9 mg/dL (ref 8.9–10.3)
Chloride: 92 mmol/L — ABNORMAL LOW (ref 98–111)
Creatinine, Ser: 1.33 mg/dL — ABNORMAL HIGH (ref 0.61–1.24)
GFR, Estimated: >60 mL/min (ref >60)
Glucose, Bld: 125 mg/dL — ABNORMAL HIGH (ref 70–99)
Potassium: 4.1 mmol/L (ref 3.5–5.1)
Sodium: 132 mmol/L — ABNORMAL LOW (ref 135–145)
Total Bilirubin: 0.4 mg/dL (ref <1.2)
Total Protein: 7.9 g/dL (ref 6.5–8.1)

## 2023-02-26 LAB — LACTIC ACID, PLASMA: Lactic Acid, Venous: 1.9 mmol/L (ref 0.5–1.9)

## 2023-02-26 MED ORDER — FENTANYL CITRATE PF 50 MCG/ML IJ SOSY
50.0000 ug | PREFILLED_SYRINGE | Freq: Once | INTRAMUSCULAR | Status: AC
  Administered 2023-02-26: 50 ug via INTRAMUSCULAR
  Filled 2023-02-26: qty 1

## 2023-02-26 MED ORDER — OXYCODONE-ACETAMINOPHEN 5-325 MG PO TABS
1.0000 | ORAL_TABLET | Freq: Once | ORAL | Status: AC
  Administered 2023-02-26: 1 via ORAL
  Filled 2023-02-26: qty 1

## 2023-02-26 NOTE — ED Triage Notes (Signed)
First Nurse Note: BIB [facility] from home. Pt has hx of DM with multiple amputations. Pt reports he burned his L pointer finger 1 month ago and didn't have it evaluated. Pt then within the last couple days intentionally cut his finger to "drain the fluid". Pt finger now swelling and painful. Pt L toes are also reportedly black per EMS. Pt visibly uncomfortable.   EMS VS:  118/53 HR 66 95% RA CBG 126 97.7 temp.  Pt reports took last dose motrin was 2 hrs pta.

## 2023-02-26 NOTE — ED Notes (Signed)
Pt yelling in lobby that he is in pain. RN went to speak with pt. Pt did not think he had been given anything for pain, and RN reminded pt that he had received medication while being triage. Pt otherwise in no acute distress. RN stated to pt that she would request further medication from provider.

## 2023-02-27 ENCOUNTER — Encounter
Admission: EM | Disposition: A | Discharge status: Home Health | Payer: Self-pay | Source: Home / Self Care | Attending: Internal Medicine

## 2023-02-27 ENCOUNTER — Inpatient Hospital Stay
Admission: EM | Admit: 2023-02-27 | Discharge: 2023-03-03 | DRG: 580 | Disposition: A | Discharge status: Home Health | Payer: 59 | Attending: Internal Medicine | Admitting: Internal Medicine

## 2023-02-27 ENCOUNTER — Inpatient Hospital Stay: Payer: 59

## 2023-02-27 ENCOUNTER — Other Ambulatory Visit: Payer: Self-pay

## 2023-02-27 ENCOUNTER — Inpatient Hospital Stay: Payer: 59 | Admitting: Anesthesiology

## 2023-02-27 DIAGNOSIS — L03012 Cellulitis of left finger: Secondary | ICD-10-CM | POA: Diagnosis present

## 2023-02-27 DIAGNOSIS — I110 Hypertensive heart disease with heart failure: Secondary | ICD-10-CM | POA: Diagnosis present

## 2023-02-27 DIAGNOSIS — L0390 Cellulitis, unspecified: Secondary | ICD-10-CM | POA: Diagnosis not present

## 2023-02-27 DIAGNOSIS — E1152 Type 2 diabetes mellitus with diabetic peripheral angiopathy with gangrene: Secondary | ICD-10-CM | POA: Diagnosis present

## 2023-02-27 DIAGNOSIS — Z23 Encounter for immunization: Secondary | ICD-10-CM | POA: Diagnosis present

## 2023-02-27 DIAGNOSIS — L03114 Cellulitis of left upper limb: Secondary | ICD-10-CM | POA: Diagnosis present

## 2023-02-27 DIAGNOSIS — Z993 Dependence on wheelchair: Secondary | ICD-10-CM | POA: Diagnosis not present

## 2023-02-27 DIAGNOSIS — N179 Acute kidney failure, unspecified: Secondary | ICD-10-CM | POA: Diagnosis present

## 2023-02-27 DIAGNOSIS — Z6841 Body Mass Index (BMI) 40.0 and over, adult: Secondary | ICD-10-CM | POA: Diagnosis not present

## 2023-02-27 DIAGNOSIS — Z8249 Family history of ischemic heart disease and other diseases of the circulatory system: Secondary | ICD-10-CM | POA: Diagnosis not present

## 2023-02-27 DIAGNOSIS — I5042 Chronic combined systolic (congestive) and diastolic (congestive) heart failure: Secondary | ICD-10-CM | POA: Diagnosis present

## 2023-02-27 DIAGNOSIS — E782 Mixed hyperlipidemia: Secondary | ICD-10-CM | POA: Diagnosis present

## 2023-02-27 DIAGNOSIS — L089 Local infection of the skin and subcutaneous tissue, unspecified: Secondary | ICD-10-CM

## 2023-02-27 DIAGNOSIS — K219 Gastro-esophageal reflux disease without esophagitis: Secondary | ICD-10-CM | POA: Diagnosis present

## 2023-02-27 DIAGNOSIS — E1142 Type 2 diabetes mellitus with diabetic polyneuropathy: Secondary | ICD-10-CM | POA: Diagnosis present

## 2023-02-27 DIAGNOSIS — I10 Essential (primary) hypertension: Secondary | ICD-10-CM | POA: Diagnosis present

## 2023-02-27 DIAGNOSIS — Z89611 Acquired absence of right leg above knee: Secondary | ICD-10-CM | POA: Diagnosis not present

## 2023-02-27 DIAGNOSIS — F32A Depression, unspecified: Secondary | ICD-10-CM | POA: Diagnosis present

## 2023-02-27 DIAGNOSIS — T23022S Burn of unspecified degree of single left finger (nail) except thumb, sequela: Secondary | ICD-10-CM | POA: Diagnosis not present

## 2023-02-27 DIAGNOSIS — F419 Anxiety disorder, unspecified: Secondary | ICD-10-CM | POA: Diagnosis present

## 2023-02-27 DIAGNOSIS — Z89022 Acquired absence of left finger(s): Secondary | ICD-10-CM | POA: Diagnosis not present

## 2023-02-27 DIAGNOSIS — E871 Hypo-osmolality and hyponatremia: Secondary | ICD-10-CM | POA: Diagnosis present

## 2023-02-27 DIAGNOSIS — E785 Hyperlipidemia, unspecified: Secondary | ICD-10-CM | POA: Diagnosis not present

## 2023-02-27 DIAGNOSIS — A419 Sepsis, unspecified organism: Principal | ICD-10-CM | POA: Diagnosis present

## 2023-02-27 DIAGNOSIS — L02512 Cutaneous abscess of left hand: Secondary | ICD-10-CM | POA: Diagnosis not present

## 2023-02-27 DIAGNOSIS — I96 Gangrene, not elsewhere classified: Secondary | ICD-10-CM | POA: Diagnosis present

## 2023-02-27 DIAGNOSIS — E1165 Type 2 diabetes mellitus with hyperglycemia: Secondary | ICD-10-CM | POA: Diagnosis present

## 2023-02-27 DIAGNOSIS — I739 Peripheral vascular disease, unspecified: Secondary | ICD-10-CM | POA: Diagnosis present

## 2023-02-27 DIAGNOSIS — N400 Enlarged prostate without lower urinary tract symptoms: Secondary | ICD-10-CM | POA: Diagnosis present

## 2023-02-27 DIAGNOSIS — E1151 Type 2 diabetes mellitus with diabetic peripheral angiopathy without gangrene: Secondary | ICD-10-CM | POA: Diagnosis present

## 2023-02-27 HISTORY — PX: INCISION AND DRAINAGE ABSCESS: SHX5864

## 2023-02-27 LAB — BASIC METABOLIC PANEL
Anion gap: 11 (ref 5–15)
BUN: 31 mg/dL — ABNORMAL HIGH (ref 6–20)
CO2: 26 mmol/L (ref 22–32)
Calcium: 8.3 mg/dL — ABNORMAL LOW (ref 8.9–10.3)
Chloride: 96 mmol/L — ABNORMAL LOW (ref 98–111)
Creatinine, Ser: 1.01 mg/dL (ref 0.61–1.24)
GFR, Estimated: >60 mL/min (ref >60)
Glucose, Bld: 334 mg/dL — ABNORMAL HIGH (ref 70–99)
Potassium: 4 mmol/L (ref 3.5–5.1)
Sodium: 133 mmol/L — ABNORMAL LOW (ref 135–145)

## 2023-02-27 LAB — CBC
HCT: 37.7 % — ABNORMAL LOW (ref 39.0–52.0)
Hemoglobin: 11.6 g/dL — ABNORMAL LOW (ref 13.0–17.0)
MCH: 21.7 pg — ABNORMAL LOW (ref 26.0–34.0)
MCHC: 30.8 g/dL (ref 30.0–36.0)
MCV: 70.6 fL — ABNORMAL LOW (ref 80.0–100.0)
Platelets: 262 10*3/uL (ref 150–400)
RBC: 5.34 MIL/uL (ref 4.22–5.81)
RDW: 17.4 % — ABNORMAL HIGH (ref 11.5–15.5)
WBC: 14.7 10*3/uL — ABNORMAL HIGH (ref 4.0–10.5)
nRBC: 0 % (ref 0.0–0.2)

## 2023-02-27 LAB — PROTIME-INR
INR: 1.1 (ref 0.8–1.2)
Prothrombin Time: 14 s (ref 11.4–15.2)

## 2023-02-27 LAB — CORTISOL-AM, BLOOD: Cortisol - AM: 17.1 ug/dL (ref 6.7–22.6)

## 2023-02-27 LAB — GLUCOSE, CAPILLARY: Glucose-Capillary: 330 mg/dL — ABNORMAL HIGH (ref 70–99)

## 2023-02-27 LAB — LACTIC ACID, PLASMA: Lactic Acid, Venous: 1.4 mmol/L (ref 0.5–1.9)

## 2023-02-27 LAB — CBG MONITORING, ED
Glucose-Capillary: 301 mg/dL — ABNORMAL HIGH (ref 70–99)
Glucose-Capillary: 330 mg/dL — ABNORMAL HIGH (ref 70–99)

## 2023-02-27 SURGERY — INCISION AND DRAINAGE, ABSCESS
Anesthesia: General | Site: Arm Lower | Laterality: Left

## 2023-02-27 MED ORDER — ACETAMINOPHEN 325 MG PO TABS: 650.0000 mg | ORAL_TABLET | Freq: Four times a day (QID) | ORAL | Status: DC | PRN

## 2023-02-27 MED ORDER — PHENYLEPHRINE 80 MCG/ML (10ML) SYRINGE FOR IV PUSH (FOR BLOOD PRESSURE SUPPORT)
PREFILLED_SYRINGE | INTRAVENOUS | Status: DC | PRN
  Administered 2023-02-27: 160 ug via INTRAVENOUS
  Administered 2023-02-27: 80 ug via INTRAVENOUS
  Administered 2023-02-27 (×2): 160 ug via INTRAVENOUS
  Administered 2023-02-27: 80 ug via INTRAVENOUS
  Administered 2023-02-27: 160 ug via INTRAVENOUS

## 2023-02-27 MED ORDER — TAMSULOSIN HCL 0.4 MG PO CAPS
0.4000 mg | ORAL_CAPSULE | Freq: Every day | ORAL | Status: DC
  Administered 2023-02-27 – 2023-03-03 (×5): 0.4 mg via ORAL
  Filled 2023-02-27 (×5): qty 1

## 2023-02-27 MED ORDER — ONDANSETRON HCL 4 MG/2ML IJ SOLN: 4.0000 mg | Freq: Four times a day (QID) | INTRAMUSCULAR | Status: DC | PRN

## 2023-02-27 MED ORDER — BUPIVACAINE HCL (PF) 0.5 % IJ SOLN
INTRAMUSCULAR | Status: AC
  Filled 2023-02-27: qty 30

## 2023-02-27 MED ORDER — DIPHENHYDRAMINE HCL 50 MG/ML IJ SOLN
25.0000 mg | Freq: Once | INTRAMUSCULAR | Status: AC
  Administered 2023-02-27: 25 mg via INTRAVENOUS
  Filled 2023-02-27: qty 1

## 2023-02-27 MED ORDER — SODIUM CHLORIDE 0.9 % IV BOLUS (SEPSIS)
1000.0000 mL | Freq: Once | INTRAVENOUS | Status: AC
  Administered 2023-02-27: 1000 mL via INTRAVENOUS

## 2023-02-27 MED ORDER — DIPHENHYDRAMINE HCL 25 MG PO CAPS
25.0000 mg | ORAL_CAPSULE | Freq: Four times a day (QID) | ORAL | Status: DC | PRN
  Administered 2023-02-27 – 2023-03-02 (×4): 25 mg via ORAL
  Filled 2023-02-27 (×5): qty 1

## 2023-02-27 MED ORDER — KETAMINE HCL 50 MG/5ML IJ SOSY
PREFILLED_SYRINGE | INTRAMUSCULAR | Status: DC | PRN
  Administered 2023-02-27: 20 mg via INTRAVENOUS
  Administered 2023-02-27: 30 mg via INTRAVENOUS

## 2023-02-27 MED ORDER — PANTOPRAZOLE SODIUM 40 MG PO TBEC
40.0000 mg | DELAYED_RELEASE_TABLET | Freq: Two times a day (BID) | ORAL | Status: DC
  Administered 2023-02-27 – 2023-03-03 (×9): 40 mg via ORAL
  Filled 2023-02-27 (×9): qty 1

## 2023-02-27 MED ORDER — HYDROMORPHONE HCL 1 MG/ML IJ SOLN
1.0000 mg | INTRAMUSCULAR | Status: DC | PRN
  Administered 2023-02-27 – 2023-03-03 (×15): 1 mg via INTRAVENOUS
  Filled 2023-02-27 (×15): qty 1

## 2023-02-27 MED ORDER — PROPOFOL 10 MG/ML IV BOLUS
INTRAVENOUS | Status: DC | PRN
  Administered 2023-02-27: 30 mg via INTRAVENOUS
  Administered 2023-02-27: 170 mg via INTRAVENOUS

## 2023-02-27 MED ORDER — ENOXAPARIN SODIUM 100 MG/ML IJ SOSY
85.0000 mg | PREFILLED_SYRINGE | INTRAMUSCULAR | Status: DC
  Administered 2023-02-27 – 2023-03-03 (×5): 85 mg via SUBCUTANEOUS
  Filled 2023-02-27 (×5): qty 1

## 2023-02-27 MED ORDER — POLYETHYLENE GLYCOL 3350 17 G PO PACK
17.0000 g | PACK | Freq: Every day | ORAL | Status: DC | PRN
  Administered 2023-03-02: 17 g via ORAL
  Filled 2023-02-27: qty 1

## 2023-02-27 MED ORDER — LIDOCAINE HCL (CARDIAC) PF 100 MG/5ML IV SOSY
PREFILLED_SYRINGE | INTRAVENOUS | Status: DC | PRN
  Administered 2023-02-27: 100 mg via INTRAVENOUS

## 2023-02-27 MED ORDER — TRAZODONE HCL 50 MG PO TABS: 25.0000 mg | ORAL_TABLET | Freq: Every evening | ORAL | Status: DC | PRN

## 2023-02-27 MED ORDER — PNEUMOCOCCAL 20-VAL CONJ VACC 0.5 ML IM SUSY
0.5000 mL | PREFILLED_SYRINGE | INTRAMUSCULAR | Status: AC
  Administered 2023-03-03: 0.5 mL via INTRAMUSCULAR
  Filled 2023-02-27: qty 0.5

## 2023-02-27 MED ORDER — FENTANYL CITRATE PF 50 MCG/ML IJ SOSY
50.0000 ug | PREFILLED_SYRINGE | Freq: Once | INTRAMUSCULAR | Status: AC
  Administered 2023-02-27: 50 ug via INTRAVENOUS
  Filled 2023-02-27: qty 1

## 2023-02-27 MED ORDER — INFLUENZA VIRUS VACC SPLIT PF (FLUZONE) 0.5 ML IM SUSY
0.5000 mL | PREFILLED_SYRINGE | INTRAMUSCULAR | Status: AC
  Administered 2023-03-03: 0.5 mL via INTRAMUSCULAR
  Filled 2023-02-27: qty 0.5

## 2023-02-27 MED ORDER — PROPOFOL 10 MG/ML IV BOLUS
INTRAVENOUS | Status: AC
  Filled 2023-02-27: qty 20

## 2023-02-27 MED ORDER — BUPROPION HCL ER (XL) 150 MG PO TB24
150.0000 mg | ORAL_TABLET | Freq: Every day | ORAL | Status: DC
  Administered 2023-02-28 – 2023-03-03 (×4): 150 mg via ORAL
  Filled 2023-02-27 (×4): qty 1

## 2023-02-27 MED ORDER — ACETAMINOPHEN 10 MG/ML IV SOLN
INTRAVENOUS | Status: AC
  Filled 2023-02-27: qty 100

## 2023-02-27 MED ORDER — VANCOMYCIN HCL 1.25 G IV SOLR
1250.0000 mg | Freq: Two times a day (BID) | INTRAVENOUS | Status: DC
  Filled 2023-02-27: qty 25

## 2023-02-27 MED ORDER — VASOPRESSIN 20 UNIT/ML IV SOLN
INTRAVENOUS | Status: AC
  Filled 2023-02-27: qty 1

## 2023-02-27 MED ORDER — SODIUM CHLORIDE 0.9 % IV SOLN
2.0000 g | INTRAVENOUS | Status: DC
  Administered 2023-02-27 – 2023-03-02 (×4): 2 g via INTRAVENOUS
  Filled 2023-02-27 (×5): qty 20

## 2023-02-27 MED ORDER — INSULIN ASPART 100 UNIT/ML IJ SOLN
11.0000 [IU] | Freq: Once | INTRAMUSCULAR | Status: AC
  Administered 2023-02-27: 11 [IU] via SUBCUTANEOUS

## 2023-02-27 MED ORDER — FENTANYL CITRATE (PF) 100 MCG/2ML IJ SOLN
INTRAMUSCULAR | Status: AC
  Filled 2023-02-27: qty 2

## 2023-02-27 MED ORDER — VANCOMYCIN HCL 2000 MG/400ML IV SOLN
2000.0000 mg | Freq: Once | INTRAVENOUS | Status: AC
  Administered 2023-02-27: 2000 mg via INTRAVENOUS
  Filled 2023-02-27: qty 400

## 2023-02-27 MED ORDER — MORPHINE SULFATE (PF) 2 MG/ML IV SOLN
2.0000 mg | INTRAVENOUS | Status: DC | PRN
  Administered 2023-02-27: 2 mg via INTRAVENOUS
  Filled 2023-02-27 (×2): qty 1

## 2023-02-27 MED ORDER — INSULIN DETEMIR 100 UNIT/ML FLEXPEN: 20.0000 [IU] | PEN_INJECTOR | Freq: Two times a day (BID) | SUBCUTANEOUS | Status: DC

## 2023-02-27 MED ORDER — VANCOMYCIN HCL 1250 MG/250ML IV SOLN
1250.0000 mg | Freq: Two times a day (BID) | INTRAVENOUS | Status: DC
  Filled 2023-02-27: qty 250

## 2023-02-27 MED ORDER — SENNOSIDES-DOCUSATE SODIUM 8.6-50 MG PO TABS
2.0000 | ORAL_TABLET | Freq: Every day | ORAL | Status: DC | PRN
  Administered 2023-03-02: 2 via ORAL
  Filled 2023-02-27: qty 2

## 2023-02-27 MED ORDER — VASOPRESSIN 20 UNIT/ML IV SOLN
INTRAVENOUS | Status: DC | PRN
  Administered 2023-02-27 (×3): 1 [IU] via INTRAVENOUS

## 2023-02-27 MED ORDER — ONDANSETRON HCL 4 MG PO TABS
4.0000 mg | ORAL_TABLET | Freq: Four times a day (QID) | ORAL | Status: DC | PRN
  Administered 2023-02-27: 4 mg via ORAL
  Filled 2023-02-27: qty 1

## 2023-02-27 MED ORDER — ONDANSETRON HCL 4 MG/2ML IJ SOLN
INTRAMUSCULAR | Status: DC | PRN
  Administered 2023-02-27: 4 mg via INTRAVENOUS

## 2023-02-27 MED ORDER — KETAMINE HCL 50 MG/5ML IJ SOSY
PREFILLED_SYRINGE | INTRAMUSCULAR | Status: AC
  Filled 2023-02-27: qty 5

## 2023-02-27 MED ORDER — DULOXETINE HCL 30 MG PO CPEP
90.0000 mg | ORAL_CAPSULE | Freq: Every day | ORAL | Status: DC
  Administered 2023-02-28 – 2023-03-03 (×4): 90 mg via ORAL
  Filled 2023-02-27 (×4): qty 3

## 2023-02-27 MED ORDER — MORPHINE SULFATE (PF) 2 MG/ML IV SOLN
2.0000 mg | INTRAVENOUS | Status: DC | PRN
  Administered 2023-02-27: 2 mg via INTRAVENOUS

## 2023-02-27 MED ORDER — ATORVASTATIN CALCIUM 20 MG PO TABS
40.0000 mg | ORAL_TABLET | Freq: Every day | ORAL | Status: DC
  Administered 2023-02-27 – 2023-03-03 (×5): 40 mg via ORAL
  Filled 2023-02-27 (×3): qty 2
  Filled 2023-02-27: qty 4
  Filled 2023-02-27: qty 2

## 2023-02-27 MED ORDER — ACETAMINOPHEN 650 MG RE SUPP: 650.0000 mg | Freq: Four times a day (QID) | RECTAL | Status: DC | PRN

## 2023-02-27 MED ORDER — HYDROMORPHONE HCL 1 MG/ML IJ SOLN
1.0000 mg | Freq: Once | INTRAMUSCULAR | Status: AC
  Administered 2023-02-27: 1 mg via INTRAVENOUS
  Filled 2023-02-27: qty 1

## 2023-02-27 MED ORDER — SODIUM CHLORIDE 0.9 % IV SOLN
2.0000 g | Freq: Once | INTRAVENOUS | Status: AC
  Administered 2023-02-27: 2 g via INTRAVENOUS
  Filled 2023-02-27: qty 12.5

## 2023-02-27 MED ORDER — DROPERIDOL 2.5 MG/ML IJ SOLN: 0.6250 mg | Freq: Once | INTRAMUSCULAR | Status: DC | PRN

## 2023-02-27 MED ORDER — INSULIN ASPART 100 UNIT/ML IJ SOLN
0.0000 [IU] | Freq: Every day | INTRAMUSCULAR | Status: DC
  Administered 2023-02-27: 4 [IU] via SUBCUTANEOUS
  Administered 2023-02-28: 5 [IU] via SUBCUTANEOUS
  Administered 2023-03-01 – 2023-03-02 (×2): 2 [IU] via SUBCUTANEOUS
  Filled 2023-02-27 (×4): qty 1

## 2023-02-27 MED ORDER — VANCOMYCIN HCL 1500 MG/300ML IV SOLN
1500.0000 mg | Freq: Two times a day (BID) | INTRAVENOUS | Status: DC
  Administered 2023-02-28 – 2023-03-02 (×5): 1500 mg via INTRAVENOUS
  Filled 2023-02-27 (×7): qty 300

## 2023-02-27 MED ORDER — VANCOMYCIN HCL IN DEXTROSE 1-5 GM/200ML-% IV SOLN
1000.0000 mg | Freq: Once | INTRAVENOUS | Status: DC
Start: 2023-02-27 — End: 2023-02-27

## 2023-02-27 MED ORDER — INSULIN ASPART 100 UNIT/ML IJ SOLN
INTRAMUSCULAR | Status: AC
  Filled 2023-02-27: qty 1

## 2023-02-27 MED ORDER — LACTATED RINGERS IV SOLN
150.0000 mL/h | INTRAVENOUS | Status: AC
  Administered 2023-02-27 (×2): 150 mL/h via INTRAVENOUS

## 2023-02-27 MED ORDER — PHENYLEPHRINE HCL-NACL 20-0.9 MG/250ML-% IV SOLN
INTRAVENOUS | Status: DC | PRN
  Administered 2023-02-27: 30 ug/min via INTRAVENOUS

## 2023-02-27 MED ORDER — FE FUM-VIT C-VIT B12-FA 460-60-0.01-1 MG PO CAPS
1.0000 | ORAL_CAPSULE | Freq: Two times a day (BID) | ORAL | Status: DC
  Administered 2023-02-27 – 2023-03-03 (×8): 1 via ORAL
  Filled 2023-02-27 (×10): qty 1

## 2023-02-27 MED ORDER — DULOXETINE HCL 30 MG PO CSDR: 30.0000 mg | DELAYED_RELEASE_CAPSULE | Freq: Three times a day (TID) | ORAL | Status: DC

## 2023-02-27 MED ORDER — FUROSEMIDE 40 MG PO TABS
40.0000 mg | ORAL_TABLET | Freq: Two times a day (BID) | ORAL | Status: DC
Start: 2023-02-27 — End: 2023-03-03
  Administered 2023-02-28 – 2023-03-03 (×7): 40 mg via ORAL
  Filled 2023-02-27 (×7): qty 1

## 2023-02-27 MED ORDER — FENTANYL CITRATE (PF) 100 MCG/2ML IJ SOLN
INTRAMUSCULAR | Status: DC | PRN
  Administered 2023-02-27 (×2): 50 ug via INTRAVENOUS

## 2023-02-27 MED ORDER — HYDROMORPHONE HCL 1 MG/ML IJ SOLN
INTRAMUSCULAR | Status: AC
  Filled 2023-02-27: qty 1

## 2023-02-27 MED ORDER — SUCCINYLCHOLINE CHLORIDE 200 MG/10ML IV SOSY
PREFILLED_SYRINGE | INTRAVENOUS | Status: DC | PRN
  Administered 2023-02-27: 160 mg via INTRAVENOUS

## 2023-02-27 MED ORDER — BUPIVACAINE HCL 0.5 % IJ SOLN
INTRAMUSCULAR | Status: DC | PRN
  Administered 2023-02-27: 10 mL

## 2023-02-27 MED ORDER — HYDROMORPHONE HCL 1 MG/ML IJ SOLN
0.2500 mg | INTRAMUSCULAR | Status: DC | PRN
Start: 2023-02-27 — End: 2023-02-27
  Administered 2023-02-27 (×2): 0.5 mg via INTRAVENOUS

## 2023-02-27 MED ORDER — OXYCODONE-ACETAMINOPHEN 5-325 MG PO TABS
1.0000 | ORAL_TABLET | Freq: Four times a day (QID) | ORAL | Status: DC | PRN
  Administered 2023-02-28 – 2023-03-03 (×6): 2 via ORAL
  Filled 2023-02-27 (×3): qty 2
  Filled 2023-02-27 (×2): qty 1
  Filled 2023-02-27 (×2): qty 2

## 2023-02-27 MED ORDER — ACETAMINOPHEN 10 MG/ML IV SOLN
1000.0000 mg | Freq: Once | INTRAVENOUS | Status: DC | PRN
Start: 2023-02-27 — End: 2023-02-27
  Administered 2023-02-27: 1000 mg via INTRAVENOUS

## 2023-02-27 MED ORDER — INSULIN ASPART 100 UNIT/ML IJ SOLN
0.0000 [IU] | Freq: Three times a day (TID) | INTRAMUSCULAR | Status: DC
  Administered 2023-02-27: 11 [IU] via SUBCUTANEOUS
  Administered 2023-02-28 (×2): 15 [IU] via SUBCUTANEOUS
  Administered 2023-02-28: 11 [IU] via SUBCUTANEOUS
  Filled 2023-02-27 (×4): qty 1

## 2023-02-27 SURGICAL SUPPLY — 23 items
APPLICATOR CHLORAPREP 10 TEAL (MISCELLANEOUS) ×1 IMPLANT
BNDG ELASTIC 3X5.8 VLCR NS LF (GAUZE/BANDAGES/DRESSINGS) ×3 IMPLANT
BNDG ELASTIC 4INX 5YD STR LF (GAUZE/BANDAGES/DRESSINGS) ×2 IMPLANT
BNDG ELASTIC 4X5.8 VLCR NS LF (GAUZE/BANDAGES/DRESSINGS) ×2 IMPLANT
BNDG GAUZE DERMACEA FLUFF 4 (GAUZE/BANDAGES/DRESSINGS) ×2 IMPLANT
BNDG STRETCH 4X75 STRL LF (GAUZE/BANDAGES/DRESSINGS) ×4 IMPLANT
CUFF TOURN SGL QUICK 18X4 (TOURNIQUET CUFF) ×1 IMPLANT
DRAIN PENROSE 12X.25 LTX STRL (MISCELLANEOUS) ×1 IMPLANT
DRAPE FLUOR MINI C-ARM 54X84 (DRAPES) ×1 IMPLANT
DRSG XEROFORM 1X8 (GAUZE/BANDAGES/DRESSINGS) ×2 IMPLANT
ELECT REM PT RETURN 9FT ADLT (ELECTROSURGICAL) ×2
ELECTRODE REM PT RTRN 9FT ADLT (ELECTROSURGICAL) ×1 IMPLANT
GAUZE SPONGE 4X4 12PLY STRL (GAUZE/BANDAGES/DRESSINGS) ×2 IMPLANT
HANDLE YANKAUER SUCT BULB TIP (MISCELLANEOUS) ×1 IMPLANT
IV CATH ANGIO 12GX3 LT BLUE (NEEDLE) ×1 IMPLANT
IV NS IRRIG 3000ML ARTHROMATIC (IV SOLUTION) ×1 IMPLANT
NS IRRIG 1000ML POUR BTL (IV SOLUTION) ×2 IMPLANT
PACK EXTREMITY ARMC (MISCELLANEOUS) ×1 IMPLANT
PENCIL SMOKE EVAC PLUME ELITE (ELECTROSURGICAL) ×1 IMPLANT
SOL PREP POV-IOD 4OZ 10% (MISCELLANEOUS) ×1 IMPLANT
SOLUTION IRRIG SURGIPHOR (IV SOLUTION) ×1 IMPLANT
SWAB CULTURE ESWAB REG 1ML (MISCELLANEOUS) ×2 IMPLANT
SYR 50ML LL SCALE MARK (SYRINGE) ×1 IMPLANT

## 2023-02-27 NOTE — Anesthesia Postprocedure Evaluation (Signed)
Anesthesia Post Note  Patient: Russell [name]  Procedure(s) Performed: INCISION AND DRAINAGE ABSCESS LEFT FINGER AND ARM (Left: Arm Lower)  Patient location during evaluation: PACU Anesthesia Type: General Level of consciousness: awake and alert Pain management: pain level controlled Vital Signs Assessment: post-procedure vital signs reviewed and stable Respiratory status: spontaneous breathing, nonlabored ventilation and respiratory function stable Cardiovascular status: blood pressure returned to baseline and stable Postop Assessment: no apparent nausea or vomiting Anesthetic complications: no   No notable events documented.   Last Vitals:  Vitals:   02/27/23 1858 02/27/23 1921  BP: (!) 85/38 136/60  Pulse: 97 95  Resp: 20 19  Temp: 37.3 C   SpO2: 93% 97%    Last Pain:  Vitals:   02/27/23 2111  TempSrc:   PainSc: 9                  Silvio Sausedo Romie Minus

## 2023-02-27 NOTE — Assessment & Plan Note (Signed)
-   This is likely prerenal due to volume depletion. - He will be hydrated with IV lactated ringer and will follow BMP. - We will avoid nephrotoxins.

## 2023-02-27 NOTE — Progress Notes (Signed)
PHARMACIST - PHYSICIAN COMMUNICATION  CONCERNING:  Enoxaparin (Lovenox) for DVT Prophylaxis    RECOMMENDATION: Patient was prescribed enoxaprin 40mg  q24 hours for VTE prophylaxis.   Filed Weights   02/26/23 1952  Weight: (!) 171.5 kg (378 lb)    Body mass index is 52.72 kg/m.  Estimated Creatinine Clearance: 97.4 mL/min (A) (by C-G formula based on SCr of 1.33 mg/dL (H)).   Based on [facility] policy patient is candidate for enoxaparin 0.5mg /kg TBW SQ every 24 hours based on BMI being >30.  DESCRIPTION: Pharmacy has adjusted enoxaparin dose per [facility] policy.  Patient is now receiving enoxaparin 0.5 mg/kg every 24 hours   Otelia Sergeant, PharmD, [facility] 02/27/2023 2:44 AM

## 2023-02-27 NOTE — Assessment & Plan Note (Signed)
Continue statin therapy.

## 2023-02-27 NOTE — Progress Notes (Signed)
CODE SEPSIS - PHARMACY COMMUNICATION  **Broad Spectrum Antibiotics should be administered within 1 hour of Sepsis diagnosis**  Time Code Sepsis Called/Page Received: 0205  Antibiotics Ordered: Cefepime & Vancomycin  Time of 1st antibiotic administration: 0214  Otelia Sergeant, PharmD, MBA 02/27/2023 2:10 AM

## 2023-02-27 NOTE — ED Notes (Addendum)
2L [place] added d/t sats in 80s.  Per pt this happens when he sleeps, Pt refused offer of cpap. EDP Smith notified.

## 2023-02-27 NOTE — Assessment & Plan Note (Addendum)
-   The patient be admitted to a medical telemetry bed. - This is secondary to left index finger and thenar eminence cellulitis. - Sepsis is manifested by leukocytosis and tachypnea. - Continue antibiotic therapy with IV Rocephin and vancomycin. - This is likely secondary to his left finger and hand cellulitis. - Orthopedic consult will be obtained. - I notified Dr. Ernest Pine about the patient as the patient has been seen by [name] in the past. - Pain management will be provided. - Warm compresses will be applied. - It is going to be a challenge to salvage the distal part of his left index.
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
## 2023-02-27 NOTE — Assessment & Plan Note (Signed)
-   We will continue inhalers Wellbutrin XL and Cymbalta.
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
## 2023-02-27 NOTE — Plan of Care (Signed)
  Problem: Coping: Goal: Ability to adjust to condition or change in health will improve Outcome: Progressing   Problem: Fluid Volume: Goal: Ability to maintain a balanced intake and output will improve Outcome: Progressing   Problem: Nutritional: Goal: Maintenance of adequate nutrition will improve Outcome: Progressing   

## 2023-02-27 NOTE — Progress Notes (Signed)
Pharmacy Antibiotic Note  Russell [name] is a 57 y.o. male admitted on 02/27/2023 with cellulitis.  Pharmacy has been consulted for Vancomycin dosing for 7 days.  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  Plan: Pt given Vancomycin 2000 mg once as loading dose Scr improved  1.33 >>1.01 Will adjust Vancomycin to 1500 mg IV Q 12 hrs. Goal AUC 400-550. Expected AUC: 467 Cmin 13.4 SCr used: 1.01, Vd used: 0.5, BMI: 52.6  Pharmacy will continue to follow and will adjust abx dosing whenever warranted.  Temp (24hrs), Avg:98.3 F (36.8 C), Min:98.3 F (36.8 C), Max:98.4 F (36.9 C)   Recent Labs  Lab 02/26/23 1956 02/26/23 1957 02/27/23 0108 02/27/23 0726  WBC  --  16.7*  --  14.7*  CREATININE  --  1.33*  --  1.01  LATICACIDVEN 1.9  --  1.4  --     Estimated Creatinine Clearance: 128.3 mL/min (by C-G formula based on SCr of 1.01 mg/dL).    Allergies  Allergen Reactions   Fluoxetine     Other reaction(s): Hallucination   Shellfish Allergy Hives   Sulfa Antibiotics Hives   Vancomycin     Other reaction(s): Red Man Syndrome   Metformin Nausea Only    Antimicrobials this admission: 12/29 Cefepime >> x 1 dose 12/29 Ceftriaxone >>  x 7 days 12/29 Vancomycin >>  x 7 days  Microbiology results: 12/29 BCx: NGTD  Thank you for allowing pharmacy to be a part of this patients care.  Bari Mantis PharmD Clinical Pharmacist 02/27/2023

## 2023-02-27 NOTE — Progress Notes (Signed)
° °  Orthopedics   Please keep NPO for surgery today.  Thanks.   Full consult not forthcoming.   Parrie Rasco, ortho

## 2023-02-27 NOTE — Brief Op Note (Signed)
02/27/2023  5:38 PM  PATIENT:  Russell [name]  57 y.o. male  PRE-OPERATIVE DIAGNOSIS:  LEFT HAND INFECTION AND ABSCESS  POST-OPERATIVE DIAGNOSIS:  LEFT HAND INFECTION AND ABSCESS  PROCEDUure: Incision and drainage of left index finger flexor tenosynovitis, debridement of distal aspect of finger including bone, incision and drainage of deep hand abscess involving the carpal tunnel and Parona space and thenar aspect.  Debridement of flexor digitorum profundus tendon to the index finger.  SURGEON:  Surgeons and Role:    * Jamar Weatherall, Adelfa Koh, MD - Primary  PHYSICIAN ASSISTANT:   ASSISTANTS: none   ANESTHESIA:   general  EBL:  10cc  BLOOD ADMINISTERED:none  DRAINS: Penrose drain in the left hand    LOCAL MEDICATIONS USED:  MARCAINE     SPECIMEN: Left finger swab x 2 of abscess sent for micro  DISPOSITION OF SPECIMEN:  N/A  COUNTS:  YES  TOURNIQUET:   Total Tourniquet Time Documented: Upper Arm (Left) - 48 minutes Total: Upper Arm (Left) - 48 minutes   DICTATION: .Reubin Milan Dictation  PLAN OF CARE: Admit to inpatient   PATIENT DISPOSITION:  PACU - hemodynamically stable.   Delay start of Pharmacological VTE agent (>24hrs) due to surgical blood loss or risk of bleeding: no  The patient was checked in by the surgical team.  I had met the patient earlier in the day in the emergency department.  We had discussed the severity of his hand infection and discussed the recommendation for surgery urgently today.  He understands that it is high risk and that his infection is complex and there is a chance of additional surgery and/or amputation or an overall decreased result due to things like persistence or recurrence of the infection, injury to nearby structures, problems with wound healing, scar tissue, loss of function, chronic pain, cosmetic problems, and an overall poor result due to medical comorbidities such as poorly controlled/noncontrolled diabetes and/or poor patient  compliance.  He desired to proceed with surgery.  He was brought up to the preoperative area and checked in by the surgical team.  I met with him to discuss starting again reviewing the details of surgery the complexity of his infection and the surgical strategy.  Questions were once again answered risks and benefits pros and cons were all discussed and the patient's questions were answered.  He desired to proceed.  Consent was obtained witnessed and verified.  After checked in by the surgical team he was taken back into surgery and given general anesthesia.  Left upper extremity was prepped and draped in the standard sterile fashion.  A surgical pause was done.  Vancomycin was initiated.  A timeout was done then the case began  I elevated the extremity without exsanguination and inflated the tourniquet.  I started on the index finger.  The distal aspect had an area of necrotic tissue and a wound that was poorly healing and there was pus in the wound bed.  I used a rongeurs to remove the necrotic tissue and a fishmouth type of wound was present.  There was pus in that area.  I could squeeze of the P1 segment of the index finger and pus was present at the tip of the finger thus confirming a purulent flexor tenosynovitis to the index finger.  Sharp excisional debridement of the skin subcutaneous tissue and the superficial aspect of the distal phalanx bone was done.  The bone was of good quality and good integrity.  There was no signs of osteomyelitis  or poor bone quality.  A counterincision was made over the base of the P1 by the MP joint and the area over the A1 pulley.  Skin flaps were elevated the pulley was identified.  The tissue was chronic in appearance and chronic infection was noted.  Some degenerative process and maturing pus was noted in that area.  We did a round of irrigation.  Sharp excisional debridement down to including the A1 pulley was done.  I released the A1 pulley.  The FDP tendon was  found to be loose in that area and had ruptured from the infection distally.  This was nonrepairable.  Especially in the context of the purulent infection.  The tendon was pulled and amputated and it allowed to retract proximally into the palm.  The integrity of the FDS tendon was confirmed to be intact.  A third incision was made over the thenar eminence.  This was an area of fluctuance erythema and warmth and exquisite tenderness.  We incised the skin bluntly dissected down to the soft tissues being careful to only use blunt dissection in the area of the recurrent motor branch of the median nerve.  We were able to open up the space here and the space did not have significant amount of pus.  We were able to access the flexor sheath of the thumb flexor tendon and there is no obvious pus there as well.  We did irrigate the flexor sheath of the thumb and this wound thoroughly.  A fourth incision was made over the carpal tunnel and a formal carpal tunnel release was done.  We did encounter pus in the carpal tunnel and carefully we protected the nerve while spreading between the deep and superficial flexors and irrigating out the carpal tunnel.  Debridement of some tenosynovitis was done while assuring we had protected the integrity of the median nerve.  Finally in excision over the wrist over Anne Hahn space was made and blunt retraction was used to enter the deep space to the fascia.  The tissue did not look normal but there was no significant pus in this area.  Protecting the median nerve we spread through the tendon layers and make sure there is full decompression in that area.  There did appear to be communication of Parona space to the carpal tunnel and hand.  There was no evidence of a horseshoe infection that included the ulnar aspect.  Clinically the small finger was not tender it was not warm and there was no sign of the infection tracking along the ulnar aspect of the hand.  These 5 wounds were  thoroughly irrigated with pulse lavage irrigation and bulb irrigation.  The flexor sheath of the index finger was cleaned out and thoroughly flushed out with Angiocath catheter as well.  In total 4 L of normal saline used to flush out everything.  Beginning of the case there was purulent material and at the end of the case after the final irrigation there was no further purulent material.  The fluid coming out of the wounds was clear.  There was no significant bleeding.  We did use monopolar cautery during the case to assure hemostasis.  At this point we felt confident that we had decompressed all the infection sources in the hand.  We did a final rinse through with a Betadine solution and a bulky bandage was placed.  No suturing of any of the wounds were done.  Plan is to heal by secondary intention.  A bulky bandage  with a Curlex wrap and a bulky gauze wrap was done followed by gentle wrapping with Ace bandage.   He tolerated the procedure well.  There were no complications. He is readmitted back to the hospitalist team.  Wound care instructions will be placed in the progress note.   Thank you  Nada Boozer, MD, Orthopedic Surgery Locums

## 2023-02-27 NOTE — Progress Notes (Signed)
Pharmacy Antibiotic Note  Russell [name] is a 57 y.o. male admitted on 02/27/2023 with cellulitis.  Pharmacy has been consulted for Vancomycin dosing for 7 days.  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  Plan: Pt given Vancomycin 2000 mg once. Vancomycin 1250 mg IV Q 12 hrs. Goal AUC 400-550. Expected AUC: 503 SCr used: 1.33, Vd used: 0.5, BMI: 52.6  Pharmacy will continue to follow and will adjust abx dosing whenever warranted.  Temp (24hrs), Avg:98.4 F (36.9 C), Min:98.3 F (36.8 C), Max:98.4 F (36.9 C)   Recent Labs  Lab 02/26/23 1956 02/26/23 1957 02/27/23 0108  WBC  --  16.7*  --   CREATININE  --  1.33*  --   LATICACIDVEN 1.9  --  1.4    Estimated Creatinine Clearance: 97.4 mL/min (A) (by C-G formula based on SCr of 1.33 mg/dL (H)).    Allergies  Allergen Reactions   Fluoxetine     Other reaction(s): Hallucination   Shellfish Allergy Hives   Sulfa Antibiotics Hives   Vancomycin     Other reaction(s): Red Man Syndrome   Metformin Nausea Only    Antimicrobials this admission: 12/29 Cefepime >> x 1 dose 12/29 Ceftriaxone >> x 7 days 12/29 Vancomycin >> x 7 days  Microbiology results: 12/29 BCx: Pending  Thank you for allowing pharmacy to be a part of this patients care.  Otelia Sergeant, PharmD, [facility] 02/27/2023 2:55 AM

## 2023-02-27 NOTE — Op Note (Signed)
02/27/2023  5:38 PM  PATIENT:  Russell [name]  57 y.o. male  PRE-OPERATIVE DIAGNOSIS:  LEFT HAND INFECTION AND ABSCESS  POST-OPERATIVE DIAGNOSIS:  LEFT HAND INFECTION AND ABSCESS  PROCEDUure: Incision and drainage of left index finger flexor tenosynovitis, debridement of distal aspect of finger including bone, incision and drainage of deep hand abscess involving the carpal tunnel and Parona space and thenar aspect.  Debridement of flexor digitorum profundus tendon to the index finger.  SURGEON:  Surgeons and Role:    * Jamar Weatherall, Adelfa Koh, MD - Primary  PHYSICIAN ASSISTANT:   ASSISTANTS: none   ANESTHESIA:   general  EBL:  10cc  BLOOD ADMINISTERED:none  DRAINS: Penrose drain in the left hand    LOCAL MEDICATIONS USED:  MARCAINE     SPECIMEN: Left finger swab x 2 of abscess sent for micro  DISPOSITION OF SPECIMEN:  N/A  COUNTS:  YES  TOURNIQUET:   Total Tourniquet Time Documented: Upper Arm (Left) - 48 minutes Total: Upper Arm (Left) - 48 minutes   DICTATION: .Reubin Milan Dictation  PLAN OF CARE: Admit to inpatient   PATIENT DISPOSITION:  PACU - hemodynamically stable.   Delay start of Pharmacological VTE agent (>24hrs) due to surgical blood loss or risk of bleeding: no  The patient was checked in by the surgical team.  I had met the patient earlier in the day in the emergency department.  We had discussed the severity of his hand infection and discussed the recommendation for surgery urgently today.  He understands that it is high risk and that his infection is complex and there is a chance of additional surgery and/or amputation or an overall decreased result due to things like persistence or recurrence of the infection, injury to nearby structures, problems with wound healing, scar tissue, loss of function, chronic pain, cosmetic problems, and an overall poor result due to medical comorbidities such as poorly controlled/noncontrolled diabetes and/or poor patient  compliance.  He desired to proceed with surgery.  He was brought up to the preoperative area and checked in by the surgical team.  I met with him to discuss starting again reviewing the details of surgery the complexity of his infection and the surgical strategy.  Questions were once again answered risks and benefits pros and cons were all discussed and the patient's questions were answered.  He desired to proceed.  Consent was obtained witnessed and verified.  After checked in by the surgical team he was taken back into surgery and given general anesthesia.  Left upper extremity was prepped and draped in the standard sterile fashion.  A surgical pause was done.  Vancomycin was initiated.  A timeout was done then the case began  I elevated the extremity without exsanguination and inflated the tourniquet.  I started on the index finger.  The distal aspect had an area of necrotic tissue and a wound that was poorly healing and there was pus in the wound bed.  I used a rongeurs to remove the necrotic tissue and a fishmouth type of wound was present.  There was pus in that area.  I could squeeze of the P1 segment of the index finger and pus was present at the tip of the finger thus confirming a purulent flexor tenosynovitis to the index finger.  Sharp excisional debridement of the skin subcutaneous tissue and the superficial aspect of the distal phalanx bone was done.  The bone was of good quality and good integrity.  There was no signs of osteomyelitis  or poor bone quality.  A counterincision was made over the base of the P1 by the MP joint and the area over the A1 pulley.  Skin flaps were elevated the pulley was identified.  The tissue was chronic in appearance and chronic infection was noted.  Some degenerative process and maturing pus was noted in that area.  We did a round of irrigation.  Sharp excisional debridement down to including the A1 pulley was done.  I released the A1 pulley.  The FDP tendon was  found to be loose in that area and had ruptured from the infection distally.  This was nonrepairable.  Especially in the context of the purulent infection.  The tendon was pulled and amputated and it allowed to retract proximally into the palm.  The integrity of the FDS tendon was confirmed to be intact.  A third incision was made over the thenar eminence.  This was an area of fluctuance erythema and warmth and exquisite tenderness.  We incised the skin bluntly dissected down to the soft tissues being careful to only use blunt dissection in the area of the recurrent motor branch of the median nerve.  We were able to open up the space here and the space did not have significant amount of pus.  We were able to access the flexor sheath of the thumb flexor tendon and there is no obvious pus there as well.  We did irrigate the flexor sheath of the thumb and this wound thoroughly.  A fourth incision was made over the carpal tunnel and a formal carpal tunnel release was done.  We did encounter pus in the carpal tunnel and carefully we protected the nerve while spreading between the deep and superficial flexors and irrigating out the carpal tunnel.  Debridement of some tenosynovitis was done while assuring we had protected the integrity of the median nerve.  Finally in excision over the wrist over Anne Hahn space was made and blunt retraction was used to enter the deep space to the fascia.  The tissue did not look normal but there was no significant pus in this area.  Protecting the median nerve we spread through the tendon layers and make sure there is full decompression in that area.  There did appear to be communication of Parona space to the carpal tunnel and hand.  There was no evidence of a horseshoe infection that included the ulnar aspect.  Clinically the small finger was not tender it was not warm and there was no sign of the infection tracking along the ulnar aspect of the hand.  These 5 wounds were  thoroughly irrigated with pulse lavage irrigation and bulb irrigation.  The flexor sheath of the index finger was cleaned out and thoroughly flushed out with Angiocath catheter as well.  In total 4 L of normal saline used to flush out everything.  Beginning of the case there was purulent material and at the end of the case after the final irrigation there was no further purulent material.  The fluid coming out of the wounds was clear.  There was no significant bleeding.  We did use monopolar cautery during the case to assure hemostasis.  At this point we felt confident that we had decompressed all the infection sources in the hand.  We did a final rinse through with a Betadine solution and a bulky bandage was placed.  No suturing of any of the wounds were done.  Plan is to heal by secondary intention.  A bulky bandage  with a Curlex wrap and a bulky gauze wrap was done followed by gentle wrapping with Ace bandage.   He tolerated the procedure well.  There were no complications. He is readmitted back to the hospitalist team.  Wound care instructions will be placed in the progress note.   Thank you  Nada Boozer, MD, [facility]

## 2023-02-27 NOTE — Anesthesia Preprocedure Evaluation (Addendum)
Anesthesia Evaluation  Patient identified by MRN, date of birth, ID band Patient awake    Reviewed: Allergy & Precautions, NPO status , Patient's Chart, lab work & pertinent test results  History of Anesthesia Complications Negative for: history of anesthetic complications  Airway Mallampati: III  TM Distance: >3 FB Neck ROM: full    Dental  (+) Poor Dentition, Dental Advidsory Given,    Pulmonary shortness of breath, neg COPD, neg recent URI  2L [place]  Tachypnea         Cardiovascular Exercise Tolerance: Good hypertension, On Medications (-) angina + Peripheral Vascular Disease and +CHF (dCHF)  (-) Past MI and (-) Cardiac Stents + dysrhythmias (s/p cardioversion 2023) Atrial Fibrillation (-) Valvular Problems/Murmurs Rhythm:Regular Rate:Normal + Peripheral Edema    Neuro/Psych  PSYCHIATRIC DISORDERS Anxiety Depression     Neuromuscular disease (R sciatica)    GI/Hepatic Neg liver ROS, PUD,,,Lower GIB- Melena - resolved currently   Endo/Other  diabetes, Poorly Controlled, Type 2  Class 3 obesity  Renal/GU Renal diseaseAKI improved  negative genitourinary   Musculoskeletal   Abdominal  (+) + obese  Peds  Hematology  (+) Blood dyscrasia, anemia   Anesthesia Other Findings LEFT HAND INFECTION AND ABSCESS Sepsis is manifested by leukocytosis and tachypnea. S/p right BKA Decreased vision R>L  Past Medical History: No date: Anxiety No date: CHF (congestive heart failure) (HCC)     Comment:  EF 30-35% No date: Chronic diastolic (congestive) heart failure (HCC) No date: Depression No date: Diabetes mellitus without complication (HCC) No date: Hyperlipemia No date: Hypertension No date: Morbid obesity (HCC) No date: Peripheral edema  Past Surgical History: No date: APPENDECTOMY No date: BELOW KNEE LEG AMPUTATION     Comment:  Right leg No date: TONSILLECTOMY  BMI    Body Mass Index: 50.27 kg/m       Reproductive/Obstetrics negative OB ROS                             Anesthesia Physical Anesthesia Plan  ASA: 3 and emergent  Anesthesia Plan: General   Post-op Pain Management: Ofirmev IV (intra-op)* and Ketamine IV*   Induction: Intravenous and Rapid sequence  PONV Risk Score and Plan: 1 and Ondansetron  Airway Management Planned: Oral ETT  Additional Equipment:   Intra-op Plan:   Post-operative Plan: Extubation in OR  Informed Consent: I have reviewed the patients History and Physical, chart, labs and discussed the procedure including the risks, benefits and alternatives for the proposed anesthesia with the patient or authorized representative who has indicated his/her understanding and acceptance.     Dental advisory given  Plan Discussed with: Anesthesiologist and CRNA  Anesthesia Plan Comments:        Anesthesia Quick Evaluation

## 2023-02-27 NOTE — Anesthesia Procedure Notes (Signed)
Procedure Name: Intubation Date/Time: 02/27/2023 4:15 PM  Performed by: Karoline Caldwell, CRNAPre-anesthesia Checklist: Patient identified, Patient being monitored, Timeout performed, Emergency Drugs available and Suction available Patient Re-evaluated:Patient Re-evaluated prior to induction Oxygen Delivery Method: Circle system utilized Preoxygenation: Pre-oxygenation with 100% oxygen Induction Type: IV induction Ventilation: Mask ventilation without difficulty Laryngoscope Size: McGrath and 4 Grade View: Grade I Tube type: Oral Tube size: 7.5 mm Number of attempts: 1 Airway Equipment and Method: Stylet Placement Confirmation: ETT inserted through vocal cords under direct vision, positive ETCO2 and breath sounds checked- equal and bilateral Secured at: 22 cm Tube secured with: Tape Dental Injury: Teeth and Oropharynx as per pre-operative assessment

## 2023-02-27 NOTE — Transfer of Care (Signed)
Immediate Anesthesia Transfer of Care Note  Patient: Russell [name]  Procedure(s) Performed: INCISION AND DRAINAGE ABSCESS LEFT FINGER AND ARM (Left: Arm Lower)  Patient Location: PACU  Anesthesia Type:General  Level of Consciousness: awake, alert , and oriented  Airway & Oxygen Therapy: Patient Spontanous Breathing  Post-op Assessment: Report given to RN and Post -op Vital signs reviewed and stable  Post vital signs: Reviewed and stable  Last Vitals:  Vitals Value Taken Time  BP 113/44 02/27/23 1737  Temp    Pulse 99 02/27/23 1738  Resp 21 02/27/23 1737  SpO2 97 % 02/27/23 1738  Vitals shown include unfiled device data.  Last Pain:  Vitals:   02/27/23 1737  TempSrc:   PainSc: 0-No pain         Complications: No notable events documented.

## 2023-02-27 NOTE — H&P (Signed)
[place]   PATIENT NAME: Russell [name]    MR#:  [id number]  DATE OF BIRTH:  [birth year]  DATE OF ADMISSION:  02/27/2023  PRIMARY CARE PHYSICIAN: [name], MD   Patient is coming from: Home  REQUESTING/REFERRING PHYSICIAN: Delton Prairie, MD  CHIEF COMPLAINT:   Chief Complaint  Patient presents with   Finger Injury    HISTORY OF PRESENT ILLNESS:  ALVIN SARACCO is a 57 y.o. morbidly obese Caucasian male with medical history significant for systolic and diastolic CHF, hypertension, sleep edema, type diabetes mellitus, likely OSA, not on CPAP, depression and anxiety, presented to the emergency room with acute onset of left index and hand swelling with erythema and tenderness as well as pain which have been going on over the last month and got significantly worse over the last few days.  He denied any fever or chills.  No nausea or vomiting or abdominal pain.  No chest pain or palpitations.  No cough or wheezing or hemoptysis.  No dysuria, oliguria or hematuria or flank pain.  ED Course: When the patient came to the ER, vital signs were within normal and later respiratory it was 22.  Labs revealed mild hyponatremia and 132 and hypochloremia of 92, BUN of 39 and creatinine 1.23 previously normal, alk phos of 128 and albumin 3.4.  Lactic acid was 1.9 and later 1.4 and CBC showed leukocytosis 16.7 with neutrophilia and anemia better than previous levels. EKG as reviewed by me : None Imaging: Left hand x-ray showed focal soft tissue swelling at the tip of the index finger with air in the soft tissue with no evidence of acute osteomyelitis.  The patient was given IV vancomycin and cefepime, 25 mg of Benadryl prior to vancomycin, 2 L bolus of IV normal saline, 1 mg of IV Dilaudid, 1 p.o. Percocet and 50 mcg of IV fentanyl.  He will be admitted to a medical telemetry bed for further evaluation and management. PAST MEDICAL HISTORY:   Past Medical History:  Diagnosis Date    Anxiety    CHF (congestive heart failure) (HCC)    EF 30-35%   Chronic diastolic (congestive) heart failure (HCC)    Depression    Diabetes mellitus without complication (HCC)    Hyperlipemia    Hypertension    Morbid obesity (HCC)    Peripheral edema     PAST SURGICAL HISTORY:   Past Surgical History:  Procedure Laterality Date   APPENDECTOMY     BELOW KNEE LEG AMPUTATION     Right leg   BIOPSY  10/14/2022   Procedure: BIOPSY;  Surgeon: Norma Fredrickson, Boykin Nearing, MD;  Location: [facility] ENDOSCOPY;  Service: Gastroenterology;;   COLONOSCOPY N/A 10/16/2022   Procedure: COLONOSCOPY;  Surgeon: [name], MD;  Location: [facility] ENDOSCOPY;  Service: Gastroenterology;  Laterality: N/A;   ESOPHAGOGASTRODUODENOSCOPY (EGD) WITH PROPOFOL N/A 10/26/2022   Procedure: ESOPHAGOGASTRODUODENOSCOPY (EGD) WITH PROPOFOL;  Surgeon: Regis Bill, MD;  Location: [facility] ENDOSCOPY;  Service: Endoscopy;  Laterality: N/A;   ESOPHAGOGASTRODUODENOSCOPY (EGD) WITH PROPOFOL N/A 10/14/2022   Procedure: ESOPHAGOGASTRODUODENOSCOPY (EGD) WITH PROPOFOL;  Surgeon: Toledo, Boykin Nearing, MD;  Location: [facility] ENDOSCOPY;  Service: Gastroenterology;  Laterality: N/A;   TONSILLECTOMY      SOCIAL HISTORY:   Social History   Tobacco Use   Smoking status: Never   Smokeless tobacco: Never  Substance Use Topics   Alcohol use: No    FAMILY HISTORY:   Family History  Problem Relation Age of Onset  COPD Mother    Diabetes Father    CAD Father    Kidney cancer Brother     DRUG ALLERGIES:   Allergies  Allergen Reactions   Fluoxetine     Other reaction(s): Hallucination   Shellfish Allergy Hives   Sulfa Antibiotics Hives   Vancomycin     Other reaction(s): Red Man Syndrome   Metformin Nausea Only    REVIEW OF SYSTEMS:   ROS As per history of present illness. All pertinent systems were reviewed above. Constitutional, HEENT, cardiovascular, respiratory, GI, GU, musculoskeletal, neuro, psychiatric, endocrine,  integumentary and hematologic systems were reviewed and are otherwise negative/unremarkable except for positive findings mentioned above in the HPI.   MEDICATIONS AT HOME:   Prior to Admission medications   Medication Sig Start Date End Date Taking? Authorizing Provider  atorvastatin (LIPITOR) 40 MG tablet Take 40 mg by mouth daily.     [provider]  buPROPion (WELLBUTRIN XL) 150 MG 24 hr tablet Take 1 tablet (150 mg total) by mouth daily. 11/06/22 12/06/22  Charise Killian, MD  cetirizine (ZYRTEC) 10 MG tablet Take 10 mg by mouth daily as needed for allergies. 06/23/20   [provider]  DULoxetine HCl 30 MG CSDR Take 30 mg by mouth in the morning, at noon, and at bedtime. 10/19/22   Alford Highland, MD  Fe Fum-Vit C-Vit B12-FA (TRIGELS-F FORTE) CAPS capsule Take 1 capsule by mouth 2 (two) times daily. 11/16/22   [name], MD  furosemide (LASIX) 40 MG tablet Take 1 tablet (40 mg total) by mouth 2 (two) times daily. 10/19/22   Alford Highland, MD  HUMULIN R 100 UNIT/ML injection Inject 0.1 mLs (10 Units total) into the skin 3 (three) times daily with meals. 11/16/22   [name], MD  insulin detemir (LEVEMIR) 100 UNIT/ML FlexPen Inject 20 Units into the skin 2 (two) times daily. 11/16/22   [name], MD  Insulin Pen Needle 33G X 5 MM MISC 1 Dose by Does not apply route 2 (two) times daily. 10/19/22   Alford Highland, MD  melatonin 5 MG TABS Take 1 tablet (5 mg total) by mouth at bedtime. 11/16/22   [name], MD  pantoprazole (PROTONIX) 40 MG tablet Take 1 tablet (40 mg total) by mouth 2 (two) times daily. 11/08/22   Enedina Finner, MD  polyethylene glycol (MIRALAX / GLYCOLAX) 17 g packet Take 17 g by mouth daily as needed. 11/16/22   [name], MD  pregabalin (LYRICA) 200 MG capsule Take 1 capsule (200 mg total) by mouth 3 (three) times daily. 11/16/22   [name], MD  Semaglutide, 1 MG/DOSE, (OZEMPIC, 1 MG/DOSE,) 4 MG/3ML SOPN Inject 1 mg into the skin once a week. 11/07/22   [provider]  senna-docusate (SENOKOT-S) 8.6-50 MG tablet Take 2 tablets by mouth daily as needed for mild constipation.    [provider]  tamsulosin (FLOMAX) 0.4 MG CAPS capsule Take 1 capsule (0.4 mg total) by mouth daily after breakfast. 10/19/22   Alford Highland, MD      VITAL SIGNS:  Blood pressure 131/64, pulse 81, temperature 98.4 F (36.9 C), temperature source Oral, resp. rate (!) 22, height 5\' 11"  (1.803 m), weight (!) 171.5 kg, SpO2 95%.  PHYSICAL EXAMINATION:  Physical Exam  GENERAL:  57 y.o.-year-old male with obese Caucasian male patient lying in the bed with no acute distress.  EYES: Pupils equal, round, reactive to light and accommodation. No scleral icterus. Extraocular muscles intact.  HEENT: Head  atraumatic, normocephalic. Oropharynx and nasopharynx clear.  NECK:  Supple, no jugular venous distention. No thyroid enlargement, no tenderness.  LUNGS: Normal breath sounds bilaterally, no wheezing, rales,rhonchi or crepitation. No use of accessory muscles of respiration.  CARDIOVASCULAR: Regular rate and rhythm, S1, S2 normal. No murmurs, rubs, or gallops.  ABDOMEN: Soft, nondistended, nontender. Bowel sounds present. No organomegaly or mass.  EXTREMITIES: No pedal edema, cyanosis, or clubbing.  He status post right AKA. NEUROLOGIC: Cranial nerves II through XII are intact. Muscle strength 5/5 in all extremities. Sensation intact. Gait not checked.  PSYCHIATRIC: The patient is alert and oriented x 3.  Normal affect and good eye contact. SKIN: Left index diffuse swelling with erythema and left thenar erythema with swelling and tenderness without fluctuation with associated discoloration of the palmar and dorsal fingertip.  Is status post distal left third finger amputation.      LABORATORY PANEL:   CBC Recent Labs  Lab 02/26/23 1957  WBC 16.7*  HGB 11.9*  HCT 38.6*  PLT 268    ------------------------------------------------------------------------------------------------------------------  Chemistries  Recent Labs  Lab 02/26/23 1957  NA 132*  K 4.1  CL 92*  CO2 27  GLUCOSE 125*  BUN 39*  CREATININE 1.33*  CALCIUM 8.9  AST 22  ALT 16  ALKPHOS 128*  BILITOT 0.4   ------------------------------------------------------------------------------------------------------------------  Cardiac Enzymes No results for input(s): "TROPONINI" in the last 168 hours. ------------------------------------------------------------------------------------------------------------------  RADIOLOGY:  DG Hand Complete Left Result Date: 02/26/2023 CLINICAL DATA:  Infection. EXAM: LEFT HAND - COMPLETE 3+ VIEW COMPARISON:  03/07/2021. FINDINGS: There is asymmetric soft tissue swelling over the tip of the index finger. There is also lucency in the soft tissue reaching up to the tuft of the distal phalanx. However, no focal bone erosions seen. Redemonstration of amputation of the middle finger through the distal portion of the proximal phalanx. No erosion at the resection margin. No acute fracture or dislocation. No aggressive osseous lesion. No significant degenerative changes of imaged joints. No radiopaque foreign bodies. Soft tissues are within normal limits. IMPRESSION: *Focal soft tissue swelling over the tip of the index finger with air in the soft tissue; however, no imaging evidence of acute osteomyelitis. Electronically Signed   By: Jules Schick M.D.   On: 02/26/2023 20:20      IMPRESSION AND PLAN:  Assessment and Plan: * Sepsis due to cellulitis [facility]) - The patient be admitted to a medical telemetry bed. - This is secondary to left index finger and thenar eminence cellulitis. - Sepsis is manifested by leukocytosis and tachypnea. - Continue antibiotic therapy with IV Rocephin and vancomycin. - This is likely secondary to his left finger and hand cellulitis. -  Orthopedic consult will be obtained. - I notified Dr. Ernest Pine about the patient as the patient has been seen by [name] in the past. - Pain management will be provided. - Warm compresses will be applied. - It is going to be a challenge to salvage the distal part of his left index.  Dyslipidemia - Continue statin therapy.  Anxiety and depression - We will continue inhalers Wellbutrin XL and Cymbalta.  AKI (acute kidney injury) (HCC) - This is likely prerenal due to volume depletion. - He will be hydrated with IV lactated ringer and will follow BMP. - We will avoid nephrotoxins.    DVT prophylaxis: Lovenox.  Advanced Care Planning:  Code Status: full code.  Family Communication:  The plan of care was discussed in details with the patient (and family). I answered all  questions. The patient agreed to proceed with the above mentioned plan. Further management will depend upon hospital course. Disposition Plan: Back to previous home environment Consults called: Orthopedic All the records are reviewed and case discussed with ED provider.  Status is: Inpatient   At the time of the admission, it appears that the appropriate admission status for this patient is inpatient.  This is judged to be reasonable and necessary in order to provide the required intensity of service to ensure the patient's safety given the presenting symptoms, physical exam findings and initial radiographic and laboratory data in the context of comorbid conditions.  The patient requires inpatient status due to high intensity of service, high risk of further deterioration and high frequency of surveillance required.  I certify that at the time of admission, it is my clinical judgment that the patient will require inpatient hospital care extending more than 2 midnights.                            Dispo: The patient is from: Home              Anticipated d/c is to: Home              Patient currently is not medically stable to  d/c.              Difficult to place patient: No  Hannah Beat M.D on 02/27/2023 at 4:45 AM  [facility]   From 7 PM-7 AM, contact night-coverage [email or web address]  CC: Primary care physician; [name], MD

## 2023-02-28 ENCOUNTER — Inpatient Hospital Stay: Payer: 59

## 2023-02-28 DIAGNOSIS — A419 Sepsis, unspecified organism: Secondary | ICD-10-CM | POA: Diagnosis not present

## 2023-02-28 DIAGNOSIS — L0390 Cellulitis, unspecified: Secondary | ICD-10-CM | POA: Diagnosis not present

## 2023-02-28 LAB — GLUCOSE, CAPILLARY
Glucose-Capillary: 265 mg/dL — ABNORMAL HIGH (ref 70–99)
Glucose-Capillary: 329 mg/dL — ABNORMAL HIGH (ref 70–99)
Glucose-Capillary: 431 mg/dL — ABNORMAL HIGH (ref 70–99)
Glucose-Capillary: 427 mg/dL — ABNORMAL HIGH (ref 70–99)
Glucose-Capillary: 466 mg/dL — ABNORMAL HIGH (ref 70–99)
Glucose-Capillary: 379 mg/dL — ABNORMAL HIGH (ref 70–99)

## 2023-02-28 LAB — CBC
HCT: 35 % — ABNORMAL LOW (ref 39.0–52.0)
Hemoglobin: 10.5 g/dL — ABNORMAL LOW (ref 13.0–17.0)
MCH: 21.4 pg — ABNORMAL LOW (ref 26.0–34.0)
MCHC: 30 g/dL (ref 30.0–36.0)
MCV: 71.3 fL — ABNORMAL LOW (ref 80.0–100.0)
Platelets: 233 10*3/uL (ref 150–400)
RBC: 4.91 MIL/uL (ref 4.22–5.81)
RDW: 17.6 % — ABNORMAL HIGH (ref 11.5–15.5)
WBC: 11.3 10*3/uL — ABNORMAL HIGH (ref 4.0–10.5)
nRBC: 0 % (ref 0.0–0.2)

## 2023-02-28 LAB — GLUCOSE, RANDOM
Glucose, Bld: 487 mg/dL — ABNORMAL HIGH (ref 70–99)
Glucose, Bld: 478 mg/dL — ABNORMAL HIGH (ref 70–99)

## 2023-02-28 LAB — BASIC METABOLIC PANEL
Anion gap: 9 (ref 5–15)
BUN: 18 mg/dL (ref 6–20)
CO2: 26 mmol/L (ref 22–32)
Calcium: 7.9 mg/dL — ABNORMAL LOW (ref 8.9–10.3)
Chloride: 99 mmol/L (ref 98–111)
Creatinine, Ser: 0.99 mg/dL (ref 0.61–1.24)
GFR, Estimated: >60 mL/min (ref >60)
Glucose, Bld: 312 mg/dL — ABNORMAL HIGH (ref 70–99)
Potassium: 3.9 mmol/L (ref 3.5–5.1)
Sodium: 134 mmol/L — ABNORMAL LOW (ref 135–145)

## 2023-02-28 MED ORDER — INSULIN ASPART 100 UNIT/ML IJ SOLN
4.0000 [IU] | Freq: Three times a day (TID) | INTRAMUSCULAR | Status: DC
Start: 2023-02-28 — End: 2023-03-03
  Administered 2023-02-28 – 2023-03-03 (×8): 4 [IU] via SUBCUTANEOUS
  Filled 2023-02-28 (×8): qty 1

## 2023-02-28 MED ORDER — PHENOL 1.4 % MT LIQD
1.0000 | OROMUCOSAL | Status: DC | PRN
  Filled 2023-02-28: qty 177

## 2023-02-28 MED ORDER — INSULIN ASPART 100 UNIT/ML IJ SOLN
0.0000 [IU] | Freq: Three times a day (TID) | INTRAMUSCULAR | Status: DC
  Administered 2023-02-28: 20 [IU] via SUBCUTANEOUS
  Administered 2023-03-01: 7 [IU] via SUBCUTANEOUS
  Administered 2023-03-01: 11 [IU] via SUBCUTANEOUS
  Administered 2023-03-01: 15 [IU] via SUBCUTANEOUS
  Administered 2023-03-02: 7 [IU] via SUBCUTANEOUS
  Administered 2023-03-02 (×2): 11 [IU] via SUBCUTANEOUS
  Administered 2023-03-03: 7 [IU] via SUBCUTANEOUS
  Administered 2023-03-03: 11 [IU] via SUBCUTANEOUS
  Filled 2023-02-28 (×9): qty 1

## 2023-02-28 MED ORDER — METOPROLOL SUCCINATE ER 50 MG PO TB24
100.0000 mg | ORAL_TABLET | Freq: Every day | ORAL | Status: DC
Start: 2023-02-28 — End: 2023-03-03
  Administered 2023-02-28 – 2023-03-03 (×4): 100 mg via ORAL
  Filled 2023-02-28 (×4): qty 2

## 2023-02-28 MED ORDER — INSULIN GLARGINE-YFGN 100 UNIT/ML ~~LOC~~ SOLN
20.0000 [IU] | Freq: Every day | SUBCUTANEOUS | Status: DC
  Administered 2023-02-28 – 2023-03-02 (×3): 20 [IU] via SUBCUTANEOUS
  Filled 2023-02-28 (×4): qty 0.2

## 2023-02-28 NOTE — Progress Notes (Signed)
Orthopedics update  I have extended my shift until tomorrow.  I am still on service today.  Will be by to pull the drain. Per nursing team warm soaks can occur every 4 hours and not every 2 hours.  Will adjust the order  Thanks   Nada Boozer, MD, Orthopedic Surgery Loc[facility]

## 2023-02-28 NOTE — Progress Notes (Signed)
Orthopedic progress note  Chief complaint, left hand infection/abscess  Subjective, no acute events overnight.  He feels better after surgery.  Objective, chart reviewed OR cultures pending, Gram stain shows gram-positive cocci and gram-negative rods On vancomycin and ceftriaxone  Operative bandage is clean and dry without any drainage or strikethrough. He can wiggle his fingers Sensation is blunted to light touch   Assessment and plan  57 year old male who is postoperative day #1 status post washout of a complex right hand infection including index finger flexor tenosynovitis, and deep abscess in the thenar eminence, carpal tunnel, and Parona space.  Clinically he feels better and looks much better after surgery.  He is on broad-spectrum antibiotics.  Cultures are pending.  Wound care and wound healing is the main focus going forward.  I discussed with the patient the plan for initiation of warm soaks.  All of the wounds will heal by secondary intention.  His sugars are still very high and this is likely going to delay his healing and potentially increase his risk for problems.  I discussed that with the patient.  The overnight nurse was present we discussed the plan to initiate wound care today at noon.  Will place a nurse order for wound care as follows: At noon today please remove all of the bandaging.  There is 1 Penrose drain that should be removed.  It is not sewn in and it will easily come out.  Just pull from 1 side.  All of the wounds are open and there are no sutures to worry about.  After the Penrose drain is out and all bandaging is off please bring the patient a very large bucket or basin and fill it with warm water from the sink.  The patient should submerge his hand under the water and keep it there for 20 minutes while he is opening and closing and gently moving the fingers and the wrist.  The more water the better.  After 20 minutes a gauze bandage can be used to wrap up  the hand.  The process of warm soaks should repeat every 2-3 hours for 20 minutes at a time.  It is expected that the wounds are going to heal slowly given the uncontrolled diabetes and his medical comorbidities.  OT order placed today as well.  I am off service today.  The orthopedic team will follow.  Thank you. Please call with any questions [phone] however   Nada Boozer, MD, Orthopedic Surgery Locums

## 2023-02-28 NOTE — Plan of Care (Signed)
  Problem: Education: Goal: Knowledge of General Education information will improve Description: Including pain rating scale, medication(s)/side effects and non-pharmacologic comfort measures Outcome: Progressing   Problem: Activity: Goal: Risk for activity intolerance will decrease Outcome: Progressing   
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
## 2023-02-28 NOTE — Inpatient Diabetes Management (Signed)
Inpatient Diabetes Program Recommendations  AACE/ADA: New Consensus Statement on Inpatient Glycemic Control (2015)  Target Ranges:  Prepandial:   less than 140 mg/dL      Peak postprandial:   less than 180 mg/dL (1-2 hours)      Critically ill patients:  140 - 180 mg/dL   Lab Results  Component Value Date   GLUCAP 329 (H) 02/28/2023   HGBA1C 12.0 (H) 10/13/2022    Latest Reference Range & Units 02/27/23 11:35 02/27/23 15:32 02/27/23 17:45 02/27/23 21:50 02/28/23 07:22  Glucose-Capillary 70 - 99 mg/dL 960 (H) 454 (H) 098 (H) 330 (H) 329 (H)  (H): Data is abnormally high  Diabetes history: DM2 Outpatient Diabetes medications:  Regular insulin 15 units tid, not taking Levemir or Ozempic Current orders for Inpatient glycemic control: Novolog 0-15 units tid, 0-5 units hs  Inpatient Diabetes Program Recommendations:   Please consider: -Semglee 20 units hs -Novolog 4 units tid meal coverage if eats 50%  Spoke with patient regarding DM medication management @ home. Patient stopped taking Ozempic due to "not doing anything-I ate twice as much". Reviewed with patient need to gradually increase per prescription dose to get to the right dose of Ozempic to curb appetite.  Discussed if he takes the listed Levemir from past MAR and patient states he only wants to take Regular insulin from [facility] and doesn't want anyone to make changes in what he currently takes. Reviewed difference and uses in long acting and short acting insulin but patient is not interested in making any changes to what he currently takes.  Thank you, Russell [name]. Russell Spelman, RN, MSN, CDCES  Diabetes Coordinator Inpatient Glycemic Control Team Team Pager [phone] (8am-5pm) 02/28/2023 10:27 AM
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
## 2023-02-28 NOTE — Progress Notes (Signed)
Progress Note   Patient: Russell [name] MWN:[id number] DOB: 11/16/1965 DOA: 02/27/2023     1 DOS: the patient was seen and examined on 02/28/2023   Brief hospital course: 57yo with h/o chronic combined CHF, HTN, OSA, DM, OSA not on CPAP, depression/anxiety, and morbid obesity with presented on 12/29 with a hand infection.  He was diagnosed with sepsis due to cellulitis/abscess and underwent I&D on 12/29.    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 presenting with infection of L hand He was noted to have leukocytosis, tachycardia, and tachypnea without evidence of acute organ dysfunction Unlikely to have sepsis but + infection Hand xray with infection/abscess but no apparent osteo Orthopedics consulted and did I&D on 12/29, left a penrose drain in place Continue antibiotic therapy with IV Rocephin and vancomycin Recommended for home health PT/OT Given significant PAD with multiple amputations, his overall morbidity/mortality risk is very high  Uncontrolled DM with hyperglycemia A1c was 12 in 09/2022 - very poorly controlled That said, he was not open to changes in his home regimen upon discussion today with DM coordinator (not taking home glargine, only using regular 10 units TID) Additionally, he was disgusted with the diabetic diet offered here, requested regular diet and double portions With these life choices, he appears unlikely to have significant ongoing healing and is likely to have significant ongoing complications Will order Semglee 20 units with AC and at bedtime insulin as well as moderate-dose SSI  PAD S/p R AKA Left leg also with chronic wounds, evidence of very poor circulation Will order LLE ABI   Dyslipidemia Continue atorvastatin   Anxiety and depression Continue Wellbutrin XL and Cymbalta  HLD Continue Toprol XL  GERD Continue pantoprazole  BPH Continue tamsulosin   AKI (acute kidney injury)   Likely prerenal due to volume depletion Resolved with IVF   Morbid Obesity Body mass index is 52.72 kg/m.Marland Kitchen  Weight loss should be encouraged Outpatient PCP/bariatric medicine/bariatric surgery f/u encouraged      Consultants: Orthopedics  Procedures: I&D 12/29  Antibiotics: Ceftriaxone 12/29- Vancomycin 12/29-   30 Day Unplanned Readmission Risk Score    Flowsheet Row ED to Hosp-Admission (Current) from 02/27/2023 in [facility] (1A)  30 Day Unplanned Readmission Risk Score (%) 33.88 Filed at 02/28/2023 0400       This score is the patient's risk of an unplanned readmission within 30 days of being discharged (0 -100%). The score is based on dignosis, age, lab data, medications, orders, and past utilization.   Low:  0-14.9   Medium: 15-21.9   High: 22-29.9   Extreme: 30 and above           Subjective: Unhappy with diabetic diet order.  Otherwise, no concerns.   Objective: Vitals:   02/28/23 0808 02/28/23 1524  BP: (!) 144/62 (!) 142/52  Pulse: 83 (!) 108  Resp: 20 20  Temp: 98.1 F (36.7 C) 99.3 F (37.4 C)  SpO2: 97% 94%    Intake/Output Summary (Last 24 hours) at 02/28/2023 1548 Last data filed at 02/27/2023 2030 Gross per 24 hour  Intake --  Output 700 ml  Net -700 ml   Filed Weights   02/26/23 1952  Weight: (!) 171.5 kg    Exam:  General:  Appears calm and comfortable and is in NAD Eyes:  EOMI, normal lids, iris ENT:  grossly normal hearing, lips & tongue, mmm Neck:  no LAD, masses  or thyromegaly Cardiovascular:  RR with mild tachycardia.   Respiratory:   CTA bilaterally with no wheezes/rales/rhonchi.  Normal respiratory effort. Abdomen:  soft, NT, ND Skin:  marked LLE stasis changes with scattered excoriations Musculoskeletal:  s/p R AKA Psychiatric:  cantankerous mood and affect, speech fluent and appropriate, AOx3 Neurologic:  CN 2-12 grossly intact, moves all extremities in coordinated  fashion  Data Reviewed: I have reviewed the patient's lab results since admission.  Pertinent labs for today include:   Glucose 312 WBC 11.3 Hgb 10.5 Wound culture pending  Blood culture NTD x 2 days   Family Communication: None present  Disposition: Status is: Inpatient Remains inpatient appropriate because: ongoing management     Time spent: 50 minutes  Unresulted Labs (From admission, onward)     Start     Ordered   Unscheduled  CBC with Differential/Platelet  Tomorrow morning,   R        02/28/23 1548   Unscheduled  Basic metabolic panel  Tomorrow morning,   R        02/28/23 1548             Author: Jonah Blue, MD 02/28/2023 3:48 PM  For on call review [email or web address].

## 2023-02-28 NOTE — Hospital Course (Signed)
57yo with h/o chronic combined CHF, HTN, OSA, DM, OSA not on CPAP, depression/anxiety, and morbid obesity with presented on 12/29 with a hand infection.  He was diagnosed with sepsis due to cellulitis/abscess and underwent I&D on 12/29.

## 2023-03-01 ENCOUNTER — Inpatient Hospital Stay: Payer: 59

## 2023-03-01 DIAGNOSIS — L0390 Cellulitis, unspecified: Secondary | ICD-10-CM | POA: Diagnosis not present

## 2023-03-01 DIAGNOSIS — A419 Sepsis, unspecified organism: Secondary | ICD-10-CM | POA: Diagnosis not present

## 2023-03-01 LAB — GLUCOSE, CAPILLARY
Glucose-Capillary: 320 mg/dL — ABNORMAL HIGH (ref 70–99)
Glucose-Capillary: 299 mg/dL — ABNORMAL HIGH (ref 70–99)
Glucose-Capillary: 304 mg/dL — ABNORMAL HIGH (ref 70–99)
Glucose-Capillary: 221 mg/dL — ABNORMAL HIGH (ref 70–99)
Glucose-Capillary: 229 mg/dL — ABNORMAL HIGH (ref 70–99)

## 2023-03-01 LAB — CBC WITH DIFFERENTIAL/PLATELET
Abs Immature Granulocytes: 0.06 10*3/uL (ref 0.00–0.07)
Basophils Absolute: 0 10*3/uL (ref 0.0–0.1)
Basophils Relative: 0 %
Eosinophils Absolute: 0.1 10*3/uL (ref 0.0–0.5)
Eosinophils Relative: 1 %
HCT: 32.6 % — ABNORMAL LOW (ref 39.0–52.0)
Hemoglobin: 9.9 g/dL — ABNORMAL LOW (ref 13.0–17.0)
Immature Granulocytes: 1 %
Lymphocytes Relative: 6 %
Lymphs Abs: 0.6 10*3/uL — ABNORMAL LOW (ref 0.7–4.0)
MCH: 21.5 pg — ABNORMAL LOW (ref 26.0–34.0)
MCHC: 30.4 g/dL (ref 30.0–36.0)
MCV: 70.7 fL — ABNORMAL LOW (ref 80.0–100.0)
Monocytes Absolute: 0.6 10*3/uL (ref 0.1–1.0)
Monocytes Relative: 6 %
Neutro Abs: 8.6 10*3/uL — ABNORMAL HIGH (ref 1.7–7.7)
Neutrophils Relative %: 86 %
Platelets: 239 10*3/uL (ref 150–400)
RBC: 4.61 MIL/uL (ref 4.22–5.81)
RDW: 17.2 % — ABNORMAL HIGH (ref 11.5–15.5)
WBC: 10 10*3/uL (ref 4.0–10.5)
nRBC: 0 % (ref 0.0–0.2)

## 2023-03-01 LAB — BASIC METABOLIC PANEL
Anion gap: 9 (ref 5–15)
BUN: 23 mg/dL — ABNORMAL HIGH (ref 6–20)
CO2: 24 mmol/L (ref 22–32)
Calcium: 7.9 mg/dL — ABNORMAL LOW (ref 8.9–10.3)
Chloride: 95 mmol/L — ABNORMAL LOW (ref 98–111)
Creatinine, Ser: 1.19 mg/dL (ref 0.61–1.24)
GFR, Estimated: >60 mL/min (ref >60)
Glucose, Bld: 302 mg/dL — ABNORMAL HIGH (ref 70–99)
Potassium: 3.9 mmol/L (ref 3.5–5.1)
Sodium: 128 mmol/L — ABNORMAL LOW (ref 135–145)

## 2023-03-01 NOTE — Progress Notes (Signed)
 Orthopedics progress note  Chief complaint infection left hand  Subjective, no acute events overnight. Patient is noted to be noncompliant in general with the nursing team  Objective, chart reviewed I had looked at the wound later in the day yesterday.  Everything looked appropriate we had started the warm soak protocol.  Patient was little agitated this morning we did not unwrap the bandaging.  He was able to wiggle his thumb move his fingers a little bit and no concerns were noted.  He is on vancomycin  and ceftriaxone .  Or cultures are pending.  Gram stain was gram-positive cocci and gram-negative rods.  He is on Lovenox  for his DVT prophylaxis   Assessment and plan  57 year old male who is postoperative day 2 for a washout of a complex and severe left hand infection including flexor tenosynovitis of the index finger, deep abscess of the thenar space in the carpal tunnel and involving Paronas space.  The patient has uncontrolled diabetes and other medical problems which have led to prior amputation in the past of his right leg and his left long finger.  He presented to the hospital septic and urgent surgery was indicated and performed.  The patient has a history of noncompliance and poorly taking care of himself.  This increases his risk for problems ongoing with the left hand.  At this point however everything looks appropriate and the patient should remain on antibiotics until the wounds are healed but are likely take more than 2 weeks.  We are awaiting culture results.  Recommend patient not be discharged until we know which antibiotics to send him home on.  His white blood cell count has trended down since surgery and is now 10.  I am off service today.  However call me with any questions about wound care.  Will sign out to the orthopedic service.  Thank you   Redell Kerbs, MD, orthopedic surgery [facility]

## 2023-03-01 NOTE — Progress Notes (Signed)
Patient refused hand soaks to far this shift. States "I just don't have the energy to do that, maybe in the morning." Will attempt again. Dressing is dry and in place to left hand.

## 2023-03-01 NOTE — Plan of Care (Signed)
  Problem: Fluid Volume: Goal: Hemodynamic stability will improve Outcome: Progressing   Problem: Clinical Measurements: Goal: Diagnostic test results will improve Outcome: Progressing Goal: Signs and symptoms of infection will decrease Outcome: Progressing   Problem: Respiratory: Goal: Ability to maintain adequate ventilation will improve Outcome: Progressing   Problem: Fluid Volume: Goal: Ability to maintain a balanced intake and output will improve Outcome: Progressing   Problem: Nutritional: Goal: Maintenance of adequate nutrition will improve Outcome: Progressing

## 2023-03-01 NOTE — Progress Notes (Signed)
 Progress Note   Patient: Russell [name] FMW:[id number] DOB: 11/08/1965 DOA: 02/27/2023     2 DOS: the patient was seen and examined on 03/01/2023   Brief hospital course: 57yo with h/o chronic combined CHF, HTN, OSA, DM, OSA not on CPAP, depression/anxiety, and morbid obesity with presented on 12/29 with a hand infection.  He was diagnosed with sepsis due to cellulitis/abscess and underwent I&D on 12/29.    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 presenting with infection of L hand He was noted to have leukocytosis, tachycardia, and tachypnea without evidence of acute organ dysfunction Unlikely to have sepsis but + infection Hand xray with infection/abscess but no apparent osteo Orthopedics consulted and did I&D on 12/29, left a penrose drain in place Wound cultures pending, anticipate sensitivities tomorrow and then will dc to home (likely MRSA, which may be treated with Bactrim and Augmentin , per ID pharmacy) Continue antibiotic therapy with IV Rocephin  and vancomycin  Recommended for home health PT/OT, will also order RN for wound care Given significant PAD with multiple amputations, his overall morbidity/mortality risk is very high   Uncontrolled DM with hyperglycemia A1c was 12 in 09/2022 - very poorly controlled That said, he was not open to changes in his home regimen upon discussion today with DM coordinator (not taking home glargine, only using regular 10 units TID) Additionally, he was disgusted with the diabetic diet offered here, requested regular diet and double portions With these life choices, he appears unlikely to have significant ongoing healing and is likely to have significant ongoing complications Will order Semglee  20 units with AC insulin    PAD S/p R AKA Left leg also with chronic wounds, evidence of very poor circulation F/u with vascular as an outpatient   Dyslipidemia Continue atorvastatin     Anxiety and depression Continue Wellbutrin  XL and Cymbalta    HLD Continue Toprol  XL   GERD Continue pantoprazole    BPH Continue tamsulosin    AKI (acute kidney injury)  Likely prerenal due to volume depletion Resolved with IVF   Morbid Obesity Body mass index is 52.72 kg/m.SABRA  Weight loss should be encouraged Outpatient PCP/bariatric medicine/bariatric surgery f/u encouraged          Consultants: Orthopedics   Procedures: I&D 12/29   Antibiotics: Ceftriaxone  12/29- Vancomycin  12/29-  30 Day Unplanned Readmission Risk Score    Flowsheet Row ED to Hosp-Admission (Current) from 02/27/2023 in [facility] (1A)  30 Day Unplanned Readmission Risk Score (%) 34.97 Filed at 03/01/2023 1200       This score is the patient's risk of an unplanned readmission within 30 days of being discharged (0 -100%). The score is based on dignosis, age, lab data, medications, orders, and past utilization.   Low:  0-14.9   Medium: 15-21.9   High: 22-29.9   Extreme: 30 and above           Subjective: Feeling ok, no specific concerns but prefers to stay hospitalized overnight while awaiting    Objective: Vitals:   03/01/23 1129 03/01/23 1523  BP: 102/62 122/62  Pulse: 73 80  Resp: (!) 22 (!) 24  Temp: 98.2 F (36.8 C) 97.8 F (36.6 C)  SpO2: 92% 94%    Intake/Output Summary (Last 24 hours) at 03/01/2023 1539 Last data filed at 03/01/2023 1112 Gross per 24 hour  Intake 1183.33 ml  Output --  Net 1183.33 ml   Filed Weights   02/26/23 1952  Weight: (!) 171.5 kg    Exam:  General:  Appears calm and comfortable and is in NAD Eyes:  EOMI, normal lids, iris ENT:  grossly normal hearing, lips & tongue, mmm Neck:  no LAD, masses or thyromegaly Cardiovascular:  RR with mild tachycardia.   Respiratory:   CTA bilaterally with no wheezes/rales/rhonchi.  Normal respiratory effort. Abdomen:  soft, NT, ND Skin:  marked LLE stasis changes with  scattered excoriations Musculoskeletal:  s/p R AKA; L hand is splinted Psychiatric:  cantankerous mood and affect, speech fluent and appropriate, AOx3 Neurologic:  CN 2-12 grossly intact, moves all extremities in coordinated fashion  Data Reviewed: I have reviewed the patient's lab results since admission.  Pertinent labs for today include:   Na++ 128 Glucose 302 BUN 23/Creatinine 1.19/GFR >60 WBC 10 Hgb 9.9, 11.9 on 12/28 Wound cultures pending     Family Communication: None present  Disposition: Status is: Inpatient Remains inpatient appropriate because: awaiting culture results     Time spent: 50 minutes  Unresulted Labs (From admission, onward)     Start     Ordered   03/02/23 0500  CBC with Differential/Platelet  Tomorrow morning,   R        03/01/23 1539   03/02/23 0500  Basic metabolic panel  Tomorrow morning,   R        03/01/23 1539   03/02/23 0430  Vancomycin , random  Once-Timed,   TIMED       Comments: Please draw level 30 prior to 0500 AM dose of vancomycin  on 01/01/2025Please call pharmacy if dose administered early    03/01/23 1311             Author: Delon Herald, MD 03/01/2023 3:39 PM  For on call review [email or web address].

## 2023-03-01 NOTE — Inpatient Diabetes Management (Signed)
 Inpatient Diabetes Program Recommendations  AACE/ADA: New Consensus Statement on Inpatient Glycemic Control (2015)  Target Ranges:  Prepandial:   less than 140 mg/dL      Peak postprandial:   less than 180 mg/dL (1-2 hours)      Critically ill patients:  140 - 180 mg/dL    Latest Reference Range & Units 02/28/23 07:22 02/28/23 11:22 02/28/23 16:21 02/28/23 20:39 02/28/23 23:13 03/01/23 01:42  Glucose-Capillary 70 - 99 mg/dL 670 (H)  11 units Novolog   431 (H)  15 units Novolog   427 (H)  19 units Novolog   20 units Semglee   466 (H)  25 units Novolog   379 (H) 320 (H)  (H): Data is abnormally high  Latest Reference Range & Units 03/01/23 08:05  Glucose-Capillary 70 - 99 mg/dL 700 (H)  15 units Novolog    (H): Data is abnormally high    Home DM Meds: Regular insulin  10 units TID     Levemir  (NOT taking)     Ozempic (NOT taking)   Current Orders: Novolog  Resistant Correction Scale/ SSI (0-20 units) TID AC + HS      Novolog  4 units TID with meals      Semglee  20 units at bedtime    Started Semglee  last PM Started Novolog  4 units meal coverage last PM   MD- Please consider:  1. Increase Semglee  to 25 units at bedtime  2. Increase Novolog  Meal Coverage to 8 units TID with meals    --Will follow patient during hospitalization--  Adina Rudolpho Arrow RN, MSN, CDCES Diabetes Coordinator Inpatient Glycemic Control Team Team Pager: [phone] (8a-5p)

## 2023-03-02 DIAGNOSIS — A419 Sepsis, unspecified organism: Secondary | ICD-10-CM | POA: Diagnosis not present

## 2023-03-02 DIAGNOSIS — L0390 Cellulitis, unspecified: Secondary | ICD-10-CM | POA: Diagnosis not present

## 2023-03-02 LAB — CBC WITH DIFFERENTIAL/PLATELET
Abs Immature Granulocytes: 0.07 10*3/uL (ref 0.00–0.07)
Basophils Absolute: 0 10*3/uL (ref 0.0–0.1)
Basophils Relative: 0 %
Eosinophils Absolute: 0.2 10*3/uL (ref 0.0–0.5)
Eosinophils Relative: 2 %
HCT: 33.7 % — ABNORMAL LOW (ref 39.0–52.0)
Hemoglobin: 10.2 g/dL — ABNORMAL LOW (ref 13.0–17.0)
Immature Granulocytes: 1 %
Lymphocytes Relative: 10 %
Lymphs Abs: 0.9 10*3/uL (ref 0.7–4.0)
MCH: 21.6 pg — ABNORMAL LOW (ref 26.0–34.0)
MCHC: 30.3 g/dL (ref 30.0–36.0)
MCV: 71.2 fL — ABNORMAL LOW (ref 80.0–100.0)
Monocytes Absolute: 0.8 10*3/uL (ref 0.1–1.0)
Monocytes Relative: 8 %
Neutro Abs: 7.3 10*3/uL (ref 1.7–7.7)
Neutrophils Relative %: 79 %
Platelets: 254 10*3/uL (ref 150–400)
RBC: 4.73 MIL/uL (ref 4.22–5.81)
RDW: 17.2 % — ABNORMAL HIGH (ref 11.5–15.5)
WBC: 9.2 10*3/uL (ref 4.0–10.5)
nRBC: 0 % (ref 0.0–0.2)

## 2023-03-02 LAB — BASIC METABOLIC PANEL
Anion gap: 13 (ref 5–15)
BUN: 34 mg/dL — ABNORMAL HIGH (ref 6–20)
CO2: 23 mmol/L (ref 22–32)
Calcium: 8.2 mg/dL — ABNORMAL LOW (ref 8.9–10.3)
Chloride: 95 mmol/L — ABNORMAL LOW (ref 98–111)
Creatinine, Ser: 1.53 mg/dL — ABNORMAL HIGH (ref 0.61–1.24)
GFR, Estimated: 52 mL/min — ABNORMAL LOW (ref >60)
Glucose, Bld: 229 mg/dL — ABNORMAL HIGH (ref 70–99)
Potassium: 3.8 mmol/L (ref 3.5–5.1)
Sodium: 131 mmol/L — ABNORMAL LOW (ref 135–145)

## 2023-03-02 LAB — GLUCOSE, CAPILLARY
Glucose-Capillary: 241 mg/dL — ABNORMAL HIGH (ref 70–99)
Glucose-Capillary: 262 mg/dL — ABNORMAL HIGH (ref 70–99)
Glucose-Capillary: 257 mg/dL — ABNORMAL HIGH (ref 70–99)
Glucose-Capillary: 219 mg/dL — ABNORMAL HIGH (ref 70–99)

## 2023-03-02 LAB — VANCOMYCIN, RANDOM: Vancomycin Rm: 22 ug/mL

## 2023-03-02 MED ORDER — INSULIN GLARGINE-YFGN 100 UNIT/ML ~~LOC~~ SOLN: 20.0000 [IU] | Freq: Every day | SUBCUTANEOUS | 11 refills | Status: DC

## 2023-03-02 MED ORDER — DIPHENHYDRAMINE HCL 25 MG PO CAPS: 25.0000 mg | ORAL_CAPSULE | Freq: Four times a day (QID) | ORAL | Status: DC | PRN

## 2023-03-02 MED ORDER — ACETAMINOPHEN 325 MG PO TABS: 650.0000 mg | ORAL_TABLET | Freq: Four times a day (QID) | ORAL | Status: AC | PRN

## 2023-03-02 MED ORDER — AMOXICILLIN-POT CLAVULANATE 875-125 MG PO TABS
1.0000 | ORAL_TABLET | Freq: Two times a day (BID) | ORAL | 0 refills | Status: AC
Start: 2023-03-02 — End: 2023-04-01

## 2023-03-02 MED ORDER — DOXYCYCLINE HYCLATE 100 MG PO TABS
100.0000 mg | ORAL_TABLET | Freq: Two times a day (BID) | ORAL | 0 refills | Status: AC
Start: 2023-03-02 — End: 2023-04-01

## 2023-03-02 MED ORDER — OXYCODONE HCL 5 MG PO TABS: 5.0000 mg | ORAL_TABLET | Freq: Three times a day (TID) | ORAL | 0 refills | Status: DC | PRN

## 2023-03-02 MED ORDER — VANCOMYCIN HCL 2000 MG/400ML IV SOLN
2000.0000 mg | INTRAVENOUS | Status: DC
  Filled 2023-03-02: qty 400

## 2023-03-02 NOTE — TOC Progression Note (Signed)
 Transition of Care [facility]) - Progression Note    Patient Details  Name: Russell [name] MRN: [id number] Date of Birth: [birth year]  Transition of Care [facility]) CM/SW Contact  Ladene Lady, LCSW Phone Number: 03/02/2023, 3:10 PM  Clinical Narrative:   Pt accepted by [facility] PT/OT/RN. Pt refusing to go home with [facility] reporting he cannot use his R hand at all and he needs assistance with tasks such as blowing his nose and wiping him. CSW offered services through [facility] pt could get through his medicaid but pt states he needs assistance now. Pt reports he is now staying home alone and cannot discharge home.          Expected Discharge Plan and Services         Expected Discharge Date: 03/02/23                                     Social Determinants of Health (SDOH) Interventions SDOH Screenings   Food Insecurity: No Food Insecurity (02/27/2023)  Housing: Low Risk  (02/27/2023)  Transportation Needs: No Transportation Needs (02/27/2023)  Utilities: Not At Risk (02/27/2023)  Financial Resource Strain: Medium Risk (06/17/2022)   Received from [facility]  Tobacco Use: Low Risk  (02/26/2023)    Readmission Risk Interventions    10/25/2022    3:55 PM  Readmission Risk Prevention Plan  Transportation Screening Complete  PCP or Specialist Appt within 3-5 Days Complete  HRI or Home Care Consult Complete  Social Work Consult for Recovery Care Planning/Counseling Complete  Palliative Care Screening Not Applicable  Medication Review [employer]) Complete

## 2023-03-02 NOTE — Progress Notes (Signed)
  Subjective: 3 Days Post-Op Procedure(s) (LRB): INCISION AND DRAINAGE ABSCESS LEFT FINGER AND ARM (Left) Patient reports pain as moderate but does feel that it is improving.   Patient reports that he is able to flex his fingers with less pain this AM. Plan is to go Home after hospital stay. Negative for chest pain and shortness of breath Fever: no Gastrointestinal:Negative for nausea and vomiting  Objective: Vital signs in last 24 hours: Temp:  [97.8 F (36.6 C)-98.4 F (36.9 C)] 98.4 F (36.9 C) (12/31 2332) Pulse Rate:  [73-80] 74 (12/31 2332) Resp:  [17-24] 20 (12/31 2332) BP: (102-123)/(57-65) 123/65 (12/31 2332) SpO2:  [84 %-98 %] 84 % (12/31 2332)  Intake/Output from previous day:  Intake/Output Summary (Last 24 hours) at 03/02/2023 0742 Last data filed at 03/01/2023 1112 Gross per 24 hour  Intake 1183.33 ml  Output --  Net 1183.33 ml    Intake/Output this shift: No intake/output data recorded.  Labs: Recent Labs    02/28/23 0426 03/01/23 0459 03/02/23 0420  HGB 10.5* 9.9* 10.2*   Recent Labs    03/01/23 0459 03/02/23 0420  WBC 10.0 9.2  RBC 4.61 4.73  HCT 32.6* 33.7*  PLT 239 254   Recent Labs    03/01/23 0459 03/02/23 0420  NA 128* 131*  K 3.9 3.8  CL 95* 95*  CO2 24 23  BUN 23* 34*  CREATININE 1.19 1.53*  GLUCOSE 302* 229*  CALCIUM  7.9* 8.2*   No results for input(s): LABPT, INR in the last 72 hours.   EXAM General - Patient is Alert, Appropriate, and Oriented Extremity - ABD soft ACE wrap intact to the right wrist. Fingers can bee seen.  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 No purulent drainage noted to the ACE wrap.  He is able to gently flex and extend his digits. He reports sensation intact to light touch this AM.  Past Medical History:  Diagnosis Date   Anxiety    CHF (congestive heart failure) (HCC)    EF 30-35%   Chronic diastolic (congestive) heart failure (HCC)     Depression    Diabetes mellitus without complication (HCC)    Hyperlipemia    Hypertension    Morbid obesity (HCC)    Peripheral edema     Assessment/Plan: 3 Days Post-Op Procedure(s) (LRB): INCISION AND DRAINAGE ABSCESS LEFT FINGER AND ARM (Left) Principal Problem:   Sepsis due to cellulitis [facility]) Active Problems:   Morbid obesity (HCC)   Hyperlipidemia, mixed   AKI (acute kidney injury) (HCC)   Dyslipidemia   Anxiety and depression   Essential hypertension   Type 2 diabetes mellitus with peripheral neuropathy (HCC)   PAD (peripheral artery disease) (HCC)  Estimated body mass index is 52.72 kg/m as calculated from the following:   Height as of this encounter: 5' 11 (1.803 m).   Weight as of this encounter: 171.5 kg. Advance diet Up with therapy D/C IV fluids when tolerating po intake.  Labs and vitals reviewed, 9.2 WBC this AM, trending down. No recent fevers. Culture demonstrated staph aureus, sensitivities pending. Continue with Vancomycin  and rocephin  at this time. Continue with dressing changes per the locum instructions. I will plan on performing dressing change to visualize the wound tomorrow AM.  DVT Prophylaxis - Lovenox  WBAT to the left hand.  DOROTHA Gustavo Level, PA-C [facility] 03/02/2023, 7:42 AM

## 2023-03-02 NOTE — Plan of Care (Signed)
  Problem: Fluid Volume: Goal: Hemodynamic stability will improve Outcome: Progressing   Problem: Clinical Measurements: Goal: Diagnostic test results will improve Outcome: Progressing Goal: Signs and symptoms of infection will decrease Outcome: Progressing   Problem: Respiratory: Goal: Ability to maintain adequate ventilation will improve Outcome: Progressing   Problem: Coping: Goal: Ability to adjust to condition or change in health will improve Outcome: Progressing   Problem: Fluid Volume: Goal: Ability to maintain a balanced intake and output will improve Outcome: Progressing   Problem: Metabolic: Goal: Ability to maintain appropriate glucose levels will improve Outcome: Progressing   Problem: Nutritional: Goal: Maintenance of adequate nutrition will improve Outcome: Progressing

## 2023-03-02 NOTE — Plan of Care (Signed)
  Problem: Fluid Volume: Goal: Ability to maintain a balanced intake and output will improve Outcome: Progressing   Problem: Nutritional: Goal: Maintenance of adequate nutrition will improve Outcome: Progressing   Problem: Education: Goal: Knowledge of General Education information will improve Description: Including pain rating scale, medication(s)/side effects and non-pharmacologic comfort measures Outcome: Progressing

## 2023-03-02 NOTE — Progress Notes (Signed)
 Pharmacy Antibiotic Note  Russell [name] is a 58 y.o. male admitted on 02/27/2023 with cellulitis.  Pharmacy has been consulted for Vancomycin  dosing for 7 days.  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  Plan: Patient renal function worsening, 1.19-->1.53 Will adjust Vancomycin  to 2000 mg IV Q 24 hrs. Goal AUC 400-550. Expected AUC: 458 Cmin 10.8 SCr used: 1.53, Vd used: 0.5, BMI: 52.6  Pharmacy will continue to follow and will adjust abx dosing whenever warranted.  Temp (24hrs), Avg:98.1 F (36.7 C), Min:97.8 F (36.6 C), Max:98.4 F (36.9 C)   Recent Labs  Lab 02/26/23 1956 02/26/23 1957 02/27/23 0108 02/27/23 0726 02/28/23 0426 03/01/23 0459 03/02/23 0420  WBC  --  16.7*  --  14.7* 11.3* 10.0 9.2  CREATININE  --  1.33*  --  1.01 0.99 1.19 1.53*  LATICACIDVEN 1.9  --  1.4  --   --   --   --   VANCORANDOM  --   --   --   --   --   --  22    Estimated Creatinine Clearance: 84.7 mL/min (A) (by C-G formula based on SCr of 1.53 mg/dL (H)).    Allergies  Allergen Reactions   Fluoxetine     Other reaction(s): Hallucination   Shellfish Allergy Hives   Sulfa Antibiotics Hives   Vancomycin      Other reaction(s): Red Man Syndrome   Metformin Nausea Only    Antimicrobials this admission: 12/29 Cefepime  >> x 1 dose 12/29 Ceftriaxone  >>  x 7 days 12/29 Vancomycin  >>  x 7 days  Microbiology results: 12/29 BCx: NGTD  Thank you for allowing pharmacy to be a part of this patient's care.  Shamari Lofquist A Charly Hunton, PharmD Clinical Pharmacist 03/02/2023 9:04 AM

## 2023-03-02 NOTE — Care Management Important Message (Signed)
 Important Message  Patient Details  Name: Russell [name] MRN: [id number] Date of Birth: 1965/07/24   Important Message Given:  Yes - Medicare IM     Cristela Blue, CMA 03/02/2023, 10:33 AM

## 2023-03-02 NOTE — Discharge Summary (Signed)
 Physician Discharge Summary   Patient: Russell [name] MRN: [id number] DOB: 01-Dec-1965  Admit date:     02/27/2023  Discharge date: 03/02/23  Discharge Physician: Delon Herald   PCP: Eliverto [name], MD   Recommendations at discharge:   You are being discharged home with home health (PT/OT/wound care RN/home health aide) Take antibiotics until wounds are healed (Doxycycline  and Augmentin , both twice daily); a 1-month supply has been given for now Follow up with Dr. Eliverto in 1-2 weeks Follow up with orthopedics You are being given a limited number of oxycodone  for breakthrough pain (not improved with Tylenol ); do not drive or make important decisions while taking this medication.  Discharge Diagnoses: Principal Problem:   Sepsis due to cellulitis [facility]) Active Problems:   Dyslipidemia   Type 2 diabetes mellitus with peripheral neuropathy (HCC)   Anxiety and depression   AKI (acute kidney injury) (HCC)   Morbid obesity (HCC)   PAD (peripheral artery disease) (HCC)   Hyperlipidemia, mixed   Essential hypertension    Hospital Course: 57yo with h/o chronic combined CHF, HTN, OSA, DM, OSA not on CPAP, depression/anxiety, and morbid obesity with presented on 12/29 with a hand infection.  He was diagnosed with sepsis due to cellulitis/abscess and underwent I&D on 12/29.    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 presenting with infection of L hand He was noted to have leukocytosis, tachycardia, and tachypnea without evidence of acute organ dysfunction Unlikely to have sepsis but + infection Hand xray with infection/abscess but no apparent osteo Orthopedics consulted and did I&D on 12/29, left a penrose drain in place Wound cultures staph aureus, sensitivities still pending (likely MRSA, which may be treated with Doxy and Augmentin , per ID pharmacy) - antibiotics may need to change once sensitivities are back  He  is having significant itching from current antibiotics; will change to PO today and discharge to home Recommended for home health PT/OT, will also order RN for wound care Given significant PAD with multiple amputations, his overall morbidity/mortality risk is very high   Uncontrolled DM with hyperglycemia A1c was 12 in 09/2022 - very poorly controlled That said, he was not open to changes in his home regimen upon discussion today with DM coordinator (not taking home glargine, only using regular 10 units TID) Additionally, he was disgusted with the diabetic diet offered here, requested regular diet and double portions With these life choices, he appears unlikely to have significant ongoing healing and is likely to have significant ongoing complications Will order Semglee  20 units with AC insulin    PAD S/p R AKA Left leg also with chronic wounds, evidence of very poor circulation F/u with vascular as an outpatient   Dyslipidemia Continue atorvastatin    Anxiety and depression Continue Wellbutrin  XL and Cymbalta    HLD Continue Toprol  XL   GERD Continue pantoprazole    BPH No longer taking tamsulosin    AKI (acute kidney injury)  Likely prerenal due to volume depletion Resolved with IVF   Morbid Obesity Body mass index is 52.72 kg/m.SABRA  Weight loss should be encouraged Outpatient PCP/bariatric medicine/bariatric surgery f/u encouraged          Consultants: Orthopedics   Procedures: I&D 12/29   Antibiotics: Cefepime  x 1 dose Ceftriaxone  12/29-1/1 Vancomycin  12/29-1/1 Augmentin  1/1- Doxycycline1/1-   Pain control - [place]  Controlled Substance Reporting System database was reviewed. and patient was instructed, not to drive, operate heavy machinery, perform activities  at heights, swimming or participation in water activities or provide baby-sitting services while on Pain, Sleep and Anxiety Medications; until their outpatient Physician has advised to do so again.  Also recommended to not to take more than prescribed Pain, Sleep and Anxiety Medications.    Disposition: Home Diet recommendation:  Carb modified diet DISCHARGE MEDICATION: Allergies as of 03/02/2023       Reactions   Fluoxetine    Other reaction(s): Hallucination   Shellfish Allergy Hives   Sulfa Antibiotics Hives   Vancomycin     Other reaction(s): Red Man Syndrome   Metformin Nausea Only        Medication List     STOP taking these medications    Fe Fum-Vit C-Vit B12-FA Caps capsule Commonly known as: TRIGELS-F FORTE   insulin  detemir 100 UNIT/ML FlexPen Commonly known as: LEVEMIR    tamsulosin  0.4 MG Caps capsule Commonly known as: FLOMAX        TAKE these medications    acetaminophen  325 MG tablet Commonly known as: TYLENOL  Take 2 tablets (650 mg total) by mouth every 6 (six) hours as needed for mild pain (pain score 1-3) (or Fever >/= 101).   amoxicillin -clavulanate 875-125 MG tablet Commonly known as: AUGMENTIN  Take 1 tablet by mouth 2 (two) times daily.   atorvastatin  40 MG tablet Commonly known as: LIPITOR Take 40 mg by mouth daily.   buPROPion  150 MG 24 hr tablet Commonly known as: WELLBUTRIN  XL Take 1 tablet (150 mg total) by mouth daily.   cetirizine 10 MG tablet Commonly known as: ZYRTEC Take 10 mg by mouth daily as needed for allergies.   diphenhydrAMINE  25 mg capsule Commonly known as: BENADRYL  Take 1 capsule (25 mg total) by mouth every 6 (six) hours as needed for allergies or itching (30 minutes prior to starting Vancomycin ).   doxycycline  100 MG tablet Commonly known as: VIBRA -TABS Take 1 tablet (100 mg total) by mouth 2 (two) times daily.   DULoxetine  30 MG capsule Commonly known as: CYMBALTA  Take 90 mg by mouth daily.   furosemide  80 MG tablet Commonly known as: LASIX  Take 80 mg by mouth 2 (two) times daily.   HumuLIN  R 100 UNIT/ML injection Generic drug: insulin  regular Inject 0.1 mLs (10 Units total) into the skin 3  (three) times daily with meals.   insulin  glargine-yfgn 100 UNIT/ML injection Commonly known as: SEMGLEE  Inject 0.2 mLs (20 Units total) into the skin at bedtime.   Insulin  Pen Needle 33G X 5 MM Misc 1 Dose by Does not apply route 2 (two) times daily.   melatonin 5 MG Tabs Take 1 tablet (5 mg total) by mouth at bedtime.   metoprolol  succinate 100 MG 24 hr tablet Commonly known as: TOPROL -XL Take 100 mg by mouth daily.   oxyCODONE  5 MG immediate release tablet Commonly known as: Roxicodone  Take 1 tablet (5 mg total) by mouth every 8 (eight) hours as needed.   pantoprazole  40 MG tablet Commonly known as: PROTONIX  Take 1 tablet (40 mg total) by mouth 2 (two) times daily.   polyethylene glycol 17 g packet Commonly known as: MIRALAX  / GLYCOLAX  Take 17 g by mouth daily as needed.   pregabalin  200 MG capsule Commonly known as: LYRICA  Take 1 capsule (200 mg total) by mouth 3 (three) times daily.   senna-docusate 8.6-50 MG tablet Commonly known as: Senokot-S Take 2 tablets by mouth daily as needed for mild constipation.               Discharge  Care Instructions  (From admission, onward)           Start     Ordered   03/02/23 0000  Discharge wound care:       Comments: Remove all dressings.  Use a very large bucket or basin and fill it with warm water from the sink.  Submerge the hand under the water and keep it there for 20 minutes while you are opening and closing and gently moving the fingers and the wrist.  The more water the better.  After 20 minutes a gauze bandage can be used to wrap up the hand.  The process of warm soaks should repeat every 4 hours for 20 minutes at a time.   03/02/23 1221            Discharge Exam:   Subjective: He reports diffuse itching and would really like for me to scratch his back. Concerned that he won't be able to wipe my butt at home.   Objective: Vitals:   03/02/23 0804 03/02/23 1150  BP: 136/78 115/69  Pulse: 81 65   Resp: 16 20  Temp: 97.8 F (36.6 C) 98.1 F (36.7 C)  SpO2: 93% 100%    Intake/Output Summary (Last 24 hours) at 03/02/2023 1221 Last data filed at 03/02/2023 9077 Gross per 24 hour  Intake 746.42 ml  Output --  Net 746.42 ml   Filed Weights   02/26/23 1952  Weight: (!) 171.5 kg    Exam:  General:  Appears calm and comfortable and is in NAD Eyes:  EOMI, normal lids, iris ENT:  grossly normal hearing, lips & tongue, mmm Neck:  no LAD, masses or thyromegaly Cardiovascular:  RR with mild tachycardia.   Respiratory:   CTA bilaterally with no wheezes/rales/rhonchi.  Normal respiratory effort. Abdomen:  soft, NT, ND Skin:  marked LLE stasis changes with scattered excoriations Musculoskeletal:  s/p R AKA; L hand is splinted Psychiatric:  blunted mood and affect, speech fluent and appropriate, AOx3 Neurologic:  CN 2-12 grossly intact, moves all extremities in coordinated fashion  Data Reviewed: I have reviewed the patient's lab results since admission.  Pertinent labs for today include:   Na++ 131, improved Glucose 229 BUN 34/Creatinine 1.53/GFR 52 WBC 9.2 Hgb 10.2 Blood cultures NTD x 3 days Wound cultures with abundant staph aureus    Condition at discharge: stable  The results of significant diagnostics from this hospitalization (including imaging, microbiology, ancillary and laboratory) are listed below for reference.   Imaging Studies: US  ARTERIAL ABI (SCREENING LOWER EXTREMITY) Result Date: 03/02/2023 CLINICAL DATA:  Stasis dermatitis Hypertension Hyperlipidemia Diabetes Claudication Right below-knee amputation 2012 EXAM: NONINVASIVE PHYSIOLOGIC VASCULAR STUDY OF BILATERAL LOWER EXTREMITIES TECHNIQUE: Evaluation of both lower extremities were performed at rest, including calculation of ankle-brachial indices with single level pressure measurements and doppler recording. COMPARISON:  None available. FINDINGS: Left ABI:  1.20 Left Lower Extremity:  Normal arterial  waveforms at the ankle. 1.0-1.4 Normal IMPRESSION: Normal ABI examination of the left lower extremity. Electronically Signed   By: Aliene Lloyd M.D.   On: 03/02/2023 02:58   DG MINI C-ARM IMAGE ONLY Result Date: 02/27/2023 There is no interpretation for this exam.  This order is for images obtained during a surgical procedure.  Please See Surgeries Tab for more information regarding the procedure.   DG Hand Complete Left Result Date: 02/26/2023 CLINICAL DATA:  Infection. EXAM: LEFT HAND - COMPLETE 3+ VIEW COMPARISON:  03/07/2021. FINDINGS: There is asymmetric soft tissue swelling over the  tip of the index finger. There is also lucency in the soft tissue reaching up to the tuft of the distal phalanx. However, no focal bone erosions seen. Redemonstration of amputation of the middle finger through the distal portion of the proximal phalanx. No erosion at the resection margin. No acute fracture or dislocation. No aggressive osseous lesion. No significant degenerative changes of imaged joints. No radiopaque foreign bodies. Soft tissues are within normal limits. IMPRESSION: *Focal soft tissue swelling over the tip of the index finger with air in the soft tissue; however, no imaging evidence of acute osteomyelitis. Electronically Signed   By: Ree Molt M.D.   On: 02/26/2023 20:20    Microbiology: Results for orders placed or performed during the hospital encounter of 02/27/23  Blood culture (single)     Status: None (Preliminary result)   Collection Time: 02/26/23  7:56 PM   Specimen: BLOOD  Result Value Ref Range Status   Specimen Description BLOOD BLOOD RIGHT HAND  Final   Special Requests   Final    BOTTLES DRAWN AEROBIC AND ANAEROBIC Blood Culture results may not be optimal due to an inadequate volume of blood received in culture bottles   Culture   Final    NO GROWTH 4 DAYS Performed at [facility], [address] [place], [place] [address]    Report Status PENDING   Incomplete  Blood culture (routine x 2)     Status: None (Preliminary result)   Collection Time: 02/27/23  1:08 AM   Specimen: BLOOD  Result Value Ref Range Status   Specimen Description BLOOD BLOOD RIGHT WRIST  Final   Special Requests   Final    BOTTLES DRAWN AEROBIC AND ANAEROBIC Blood Culture results may not be optimal due to an inadequate volume of blood received in culture bottles   Culture   Final    NO GROWTH 3 DAYS Performed at [facility], [address], [place], [place] [address]    Report Status PENDING  Incomplete  Blood culture (routine x 2)     Status: None (Preliminary result)   Collection Time: 02/27/23  1:08 AM   Specimen: BLOOD  Result Value Ref Range Status   Specimen Description BLOOD BLOOD RIGHT ARM  Final   Special Requests   Final    BOTTLES DRAWN AEROBIC AND ANAEROBIC Blood Culture results may not be optimal due to an inadequate volume of blood received in culture bottles   Culture   Final    NO GROWTH 3 DAYS Performed at [facility], [address] [place], [place] [address]    Report Status PENDING  Incomplete  Aerobic Culture w Gram Stain (superficial specimen)     Status: None (Preliminary result)   Collection Time: 02/27/23  4:41 PM   Specimen: Wound  Result Value Ref Range Status   Specimen Description WOUND LEFT HAND  Final   Special Requests ID B  Final   Gram Stain   Final    RARE WBC PRESENT, PREDOMINANTLY PMN MODERATE GRAM POSITIVE COCCI FEW GRAM NEGATIVE RODS    Culture   Final    ABUNDANT STAPHYLOCOCCUS AUREUS SUSCEPTIBILITIES TO FOLLOW Performed at [facility], [address] [place], [place] [address]    Report Status PENDING  Incomplete  Aerobic Culture w Gram Stain (superficial specimen)     Status: None (Preliminary result)   Collection Time: 02/27/23  4:42 PM   Specimen: Wound  Result Value Ref Range Status  Specimen Description WOUND LEFT HAND  Final   Special Requests ID A  Final   Gram  Stain   Final    RARE WBC PRESENT, PREDOMINANTLY PMN MODERATE GRAM POSITIVE COCCI FEW GRAM NEGATIVE RODS    Culture   Final    ABUNDANT STAPHYLOCOCCUS AUREUS SUSCEPTIBILITIES TO FOLLOW Performed at [facility], [address] [place], [place] [address]    Report Status PENDING  Incomplete    Labs: CBC: Recent Labs  Lab 02/26/23 1957 02/27/23 0726 02/28/23 0426 03/01/23 0459 03/02/23 0420  WBC 16.7* 14.7* 11.3* 10.0 9.2  NEUTROABS 14.1*  --   --  8.6* 7.3  HGB 11.9* 11.6* 10.5* 9.9* 10.2*  HCT 38.6* 37.7* 35.0* 32.6* 33.7*  MCV 69.5* 70.6* 71.3* 70.7* 71.2*  PLT 268 262 233 239 254   Basic Metabolic Panel: Recent Labs  Lab 02/26/23 1957 02/27/23 0726 02/28/23 0426 02/28/23 1209 02/28/23 1658 03/01/23 0459 03/02/23 0420  NA 132* 133* 134*  --   --  128* 131*  K 4.1 4.0 3.9  --   --  3.9 3.8  CL 92* 96* 99  --   --  95* 95*  CO2 27 26 26   --   --  24 23  GLUCOSE 125* 334* 312* 487* 478* 302* 229*  BUN 39* 31* 18  --   --  23* 34*  CREATININE 1.33* 1.01 0.99  --   --  1.19 1.53*  CALCIUM  8.9 8.3* 7.9*  --   --  7.9* 8.2*   Liver Function Tests: Recent Labs  Lab 02/26/23 1957  AST 22  ALT 16  ALKPHOS 128*  BILITOT 0.4  PROT 7.9  ALBUMIN  3.4*   CBG: Recent Labs  Lab 03/01/23 1126 03/01/23 1611 03/01/23 2104 03/02/23 0806 03/02/23 1145  GLUCAP 304* 221* 229* 241* 262*    Discharge time spent: greater than 30 minutes.  Signed: Delon Herald, MD [facility] 03/02/2023

## 2023-03-03 DIAGNOSIS — L0390 Cellulitis, unspecified: Secondary | ICD-10-CM | POA: Diagnosis not present

## 2023-03-03 DIAGNOSIS — A419 Sepsis, unspecified organism: Secondary | ICD-10-CM | POA: Diagnosis not present

## 2023-03-03 LAB — CULTURE, BLOOD (SINGLE): Culture: NO GROWTH

## 2023-03-03 LAB — AEROBIC CULTURE W GRAM STAIN (SUPERFICIAL SPECIMEN)

## 2023-03-03 LAB — BASIC METABOLIC PANEL
Anion gap: 11 (ref 5–15)
BUN: 34 mg/dL — ABNORMAL HIGH (ref 6–20)
CO2: 25 mmol/L (ref 22–32)
Calcium: 8.3 mg/dL — ABNORMAL LOW (ref 8.9–10.3)
Chloride: 96 mmol/L — ABNORMAL LOW (ref 98–111)
Creatinine, Ser: 1.03 mg/dL (ref 0.61–1.24)
GFR, Estimated: >60 mL/min (ref >60)
Glucose, Bld: 228 mg/dL — ABNORMAL HIGH (ref 70–99)
Potassium: 3.7 mmol/L (ref 3.5–5.1)
Sodium: 132 mmol/L — ABNORMAL LOW (ref 135–145)

## 2023-03-03 LAB — GLUCOSE, CAPILLARY
Glucose-Capillary: 223 mg/dL — ABNORMAL HIGH (ref 70–99)
Glucose-Capillary: 253 mg/dL — ABNORMAL HIGH (ref 70–99)

## 2023-03-03 LAB — VANCOMYCIN, RANDOM: Vancomycin Rm: 9 ug/mL

## 2023-03-03 MED ORDER — AMOXICILLIN-POT CLAVULANATE 875-125 MG PO TABS
1.0000 | ORAL_TABLET | Freq: Two times a day (BID) | ORAL | Status: DC
  Administered 2023-03-03: 1 via ORAL
  Filled 2023-03-03: qty 1

## 2023-03-03 MED ORDER — DOXYCYCLINE HYCLATE 100 MG PO TABS
100.0000 mg | ORAL_TABLET | Freq: Two times a day (BID) | ORAL | Status: DC
  Administered 2023-03-03: 100 mg via ORAL
  Filled 2023-03-03: qty 1

## 2023-03-03 MED ORDER — OXYCODONE-ACETAMINOPHEN 5-325 MG PO TABS
1.0000 | ORAL_TABLET | Freq: Four times a day (QID) | ORAL | Status: DC | PRN
  Administered 2023-03-03: 1 via ORAL
  Filled 2023-03-03: qty 1

## 2023-03-03 NOTE — Plan of Care (Signed)
   Problem: Education: Goal: Knowledge of General Education information will improve Description Including pain rating scale, medication(s)/side effects and non-pharmacologic comfort measures Outcome: Progressing

## 2023-03-03 NOTE — Progress Notes (Signed)
 Subjective: 4 Days Post-Op Procedure(s) (LRB): INCISION AND DRAINAGE ABSCESS LEFT FINGER AND ARM (Left) Patient reports pain as moderate in the left hand. Patient reports that he is able to flex his fingers with less pain but is still struggling to move the fingers due to continued swelling. Plan is to go Home after hospital stay. Negative for chest pain and shortness of breath Fever: no Gastrointestinal:Negative for nausea and vomiting  Objective: Vital signs in last 24 hours: Temp:  [97.6 F (36.4 C)-98.4 F (36.9 C)] 97.6 F (36.4 C) (01/02 0734) Pulse Rate:  [65-78] 78 (01/02 0734) Resp:  [16-22] 18 (01/02 0734) BP: (115-152)/(63-71) 152/64 (01/02 0734) SpO2:  [87 %-100 %] 100 % (01/02 0734)  Intake/Output from previous day:  Intake/Output Summary (Last 24 hours) at 03/03/2023 1056 Last data filed at 03/02/2023 1600 Gross per 24 hour  Intake 700 ml  Output --  Net 700 ml    Intake/Output this shift: No intake/output data recorded.  Labs: Recent Labs    03/01/23 0459 03/02/23 0420  HGB 9.9* 10.2*   Recent Labs    03/01/23 0459 03/02/23 0420  WBC 10.0 9.2  RBC 4.61 4.73  HCT 32.6* 33.7*  PLT 239 254   Recent Labs    03/02/23 0420 03/03/23 0411  NA 131* 132*  K 3.8 3.7  CL 95* 96*  CO2 23 25  BUN 34* 34*  CREATININE 1.53* 1.03  GLUCOSE 229* 228*  CALCIUM  8.2* 8.3*   No results for input(s): LABPT, INR in the last 72 hours.   EXAM General - Patient is Alert, Appropriate, and Oriented Extremity - ABD soft ACE wrap has been removed by PT prior to my arrival. Skin exam of the hand does reveal continued swelling. Point finger with moderate swelling, s/p long finger PIP amputation.  He is able to gently flex and extend fingers but stuggles to make full fist due to flexion. Incisions along the volar aspect of the hand/wrist are healing well without purulent drainage or significant erythema.  No fluid can be palpated around the incision sites  today. Left patient out of ACE wrap at this time as he states nurse was due to come soak the hand and perform a dressing change per LOCUMs instructions in previous notes. He reports sensation intact to light touch this AM.  Past Medical History:  Diagnosis Date   Anxiety    CHF (congestive heart failure) (HCC)    EF 30-35%   Chronic diastolic (congestive) heart failure (HCC)    Depression    Diabetes mellitus without complication (HCC)    Hyperlipemia    Hypertension    Morbid obesity (HCC)    Peripheral edema     Assessment/Plan: 4 Days Post-Op Procedure(s) (LRB): INCISION AND DRAINAGE ABSCESS LEFT FINGER AND ARM (Left) Principal Problem:   Sepsis due to cellulitis [facility]) Active Problems:   Morbid obesity (HCC)   Hyperlipidemia, mixed   AKI (acute kidney injury) (HCC)   Dyslipidemia   Anxiety and depression   Essential hypertension   Type 2 diabetes mellitus with peripheral neuropathy (HCC)   PAD (peripheral artery disease) (HCC)  Estimated body mass index is 52.72 kg/m as calculated from the following:   Height as of this encounter: 5' 11 (1.803 m).   Weight as of this encounter: 171.5 kg. Advance diet Up with therapy D/C IV fluids when tolerating po intake.  Labs and vitals reviewed, 9.2 WBC yesterday. No recent fevers. Culture demonstrated staph aureus, sensitivities available at this time.  Currently on Augmentin  and Doxycycline  orally at this time. Continue with dressing changes per the locum instructions. Plan is for possible d/c to SNF due to difficulty caring for himself at home and difficulty with performing dressing changes on his own at home.  DVT Prophylaxis - Lovenox  WBAT to the left hand.  DOROTHA Gustavo Level, PA-C [facility] Orthopaedic Surgery 03/03/2023, 10:56 AM

## 2023-03-03 NOTE — Progress Notes (Signed)
 Occupational Therapy Treatment Patient Details Name: Russell [name] MRN: [id number] DOB: [birth year] Today's Date: 03/03/2023   History of present illness Russell [name] is a 58 y.o. Caucasian male with medical history significant for combined systolic and diastolic CHF, right BKA, anxiety and depression, type 2 diabetes mellitus, hypertension, dyslipidemia that presented to the ED for L hand infection/abscess, s/p I&D.   OT comments  Pt seen for OT tx, at behest of care team. Pt seated EOB receiving pain medication from RN upon arrival. Pt reporting significant pain, swelling, and limited AROM in L hand. Gentle AAROM for L hand provided with bandaging removed. Pt educated on need for self ROM to tolerance (not to the point of severe pain) and attempted to help adjust expectations for recovery. Pt attempting to perform significant end range self ROM to L hand causing severe pain and expressing frustration that he can't straighten his fingers and that therapy just needs to do it for him. Difficult to redirect pt to learning opportunities as he tends to focus on additional frustrations. OT attempted facilitating problem solving for ADL at home given LUE limitations. Pt previously provided with handout with visual aides and descriptions of AE to assist with pericare, which pt reports is his biggest difficulty along with concern that he won't be able to wash dishes. Emotional support and active listening provided. Pt does demonstrate impairments in ADL independence primarily due to LUE limitations. Recommend continue OT services.       If plan is discharge home, recommend the following:  A little help with bathing/dressing/bathroom;Assistance with cooking/housework;Direct supervision/assist for medications management;Assist for transportation   Equipment Recommendations  Other (comment) (toileting aide)    Recommendations for Other Services      Precautions / Restrictions Precautions Precautions:  Fall Precaution Comments: Has ill-fitting RLE prosthetic (pin not working; comcast pot-holders in prosthetic to keep it tighter.) Restrictions Weight Bearing Restrictions Per Provider Order: No Other Position/Activity Restrictions: L hand splinted, hx of R BKA       Mobility Bed Mobility               General bed mobility comments: Pt recieved seated at EOB and left seated EOB; declined t/f to recliner at this time.    Transfers                         Balance Overall balance assessment: Needs assistance Sitting-balance support: Feet supported Sitting balance-Leahy Scale: Good                                     ADL either performed or assessed with clinical judgement   ADL Overall ADL's : Needs assistance/impaired                                       General ADL Comments: Pt currently requiring assist for thoroughness with pericare and other ADL tasks requiring BUE involvement 2/2 L hand pain/swelling, and decreased ROM/strength    Extremity/Trunk Assessment              Vision       Perception     Praxis      Cognition Arousal: Alert Behavior During Therapy: WFL for tasks assessed/performed Overall Cognitive Status: Within Functional Limits for tasks assessed  General Comments: is resistant to education and potential solutions to his difficulties at home        Exercises Other Exercises Other Exercises: Pt educated in expectations regarding gentle ROM to tolerance, not pushing point the past of severe pain, and attempted to help pt set more realistic expectations for recovery time for LUE.    Shoulder Instructions       General Comments      Pertinent Vitals/ Pain       Pain Assessment Pain Assessment: Faces Faces Pain Scale: Hurts even more Pain Location: L hand Pain Descriptors / Indicators: Grimacing, Guarding, Moaning Pain Intervention(s): Limited  activity within patient's tolerance, Monitored during session, Premedicated before session, Repositioned  Home Living                                          Prior Functioning/Environment              Frequency  Min 1X/week        Progress Toward Goals  OT Goals(current goals can now be found in the care plan section)  Progress towards OT goals: OT to reassess next treatment  Acute Rehab OT Goals Patient Stated Goal: go home; eat what I want to OT Goal Formulation: With patient Time For Goal Achievement: 03/14/23 Potential to Achieve Goals: Fair  Plan      Co-evaluation    PT/OT/SLP Co-Evaluation/Treatment: Yes Reason for Co-Treatment: Necessary to address cognition/behavior during functional activity PT goals addressed during session: Strengthening/ROM OT goals addressed during session: Strengthening/ROM      AM-PAC OT 6 Clicks Daily Activity     Outcome Measure   Help from another person eating meals?: None Help from another person taking care of personal grooming?: None Help from another person toileting, which includes using toliet, bedpan, or urinal?: A Little Help from another person bathing (including washing, rinsing, drying)?: A Little Help from another person to put on and taking off regular upper body clothing?: None Help from another person to put on and taking off regular lower body clothing?: A Little 6 Click Score: 21    End of Session    OT Visit Diagnosis: Unsteadiness on feet (R26.81);Pain Pain - Right/Left: Left Pain - part of body: Hand   Activity Tolerance Patient limited by pain   Patient Left in bed;with call bell/phone within reach;Other (comment) (seated EOB with LUE elevated on tray table)   Nurse Communication Mobility status        Time: 9160-9090 OT Time Calculation (min): 30 min  Charges: OT General Charges $OT Visit: 1 Visit OT Treatments $Therapeutic Activity: 8-22 mins  Warren SAUNDERS., MPH, MS,  OTR/L ascom [phone] 03/03/23, 11:44 AM

## 2023-03-03 NOTE — Progress Notes (Signed)
 Reviewed discharge note with patient. Patient acknowledged understanding. Patient discharged with personal belongings. Patient transported home via EMS.

## 2023-03-03 NOTE — Progress Notes (Signed)
 Physical Therapy Treatment Patient Details Name: Russell [name] MRN: [id number] DOB: [birth year] Today's Date: 03/03/2023   History of Present Illness Russell [name] is a 58 y.o. Caucasian male with medical history significant for combined systolic and diastolic CHF, right BKA, anxiety and depression, type 2 diabetes mellitus, hypertension, dyslipidemia that presented to the ED for L hand infection/abscess, s/p I&D.    PT Comments  Pt seated EOB with OT upon PT arrival. Pt did not perform any mobility this session; very focused on L hand deficits and inability to care for himself at home. PT/OT talked through several options and attempted to problem solve as able. Anticipate pt would truly have difficulty with pericare (has difficulty at baseline however) housemaking/food prep, as well as adequate wound care at home. L hand wrap doffed and gentle ROM provided as able but pt requesting firm, end range stretching that did not seem appropriate due to swelling, wounds, and ROM limitations, and pt resistant to this education as well as provided ROM himself. MD/RN notified of pt concerns and difficulties at home.     If plan is discharge home, recommend the following: A little help with bathing/dressing/bathroom;Assistance with cooking/housework;Assist for transportation;Help with stairs or ramp for entrance   Can travel by private vehicle        Equipment Recommendations  None recommended by PT    Recommendations for Other Services       Precautions / Restrictions Precautions Precautions: Fall Precaution Comments: Has ill-fitting RLE prosthetic (pin not working; [employer] pot-holders in prosthetic to keep it tighter.) Restrictions Weight Bearing Restrictions Per Provider Order: No Other Position/Activity Restrictions: L hand splinted, hx of R BKA     Mobility  Bed Mobility               General bed mobility comments: Pt recieved seated at EOB and left seated EOB; declined t/f to  recliner at this time.    Transfers                        Ambulation/Gait                   Stairs             Wheelchair Mobility     Tilt Bed    Modified Rankin (Stroke Patients Only)       Balance                                            Cognition Arousal: Alert Behavior During Therapy: WFL for tasks assessed/performed Overall Cognitive Status: Within Functional Limits for tasks assessed                                 General Comments: is resistant to education and potential solutions to his difficulties at home        Exercises      General Comments        Pertinent Vitals/Pain Pain Assessment Pain Assessment: Faces Faces Pain Scale: Hurts even more Pain Location: L hand Pain Descriptors / Indicators: Grimacing, Guarding, Moaning Pain Intervention(s): Limited activity within patient's tolerance, Monitored during session, Repositioned, Premedicated before session    Home Living  Prior Function            PT Goals (current goals can now be found in the care plan section) Progress towards PT goals: Progressing toward goals    Frequency    Min 1X/week      PT Plan      Co-evaluation              AM-PAC PT 6 Clicks Mobility   Outcome Measure  Help needed turning from your back to your side while in a flat bed without using bedrails?: None Help needed moving from lying on your back to sitting on the side of a flat bed without using bedrails?: None Help needed moving to and from a bed to a chair (including a wheelchair)?: None Help needed standing up from a chair using your arms (e.g., wheelchair or bedside chair)?: None Help needed to walk in hospital room?: A Little Help needed climbing 3-5 steps with a railing? : A Lot 6 Click Score: 21    End of Session   Activity Tolerance: Patient tolerated treatment well Patient left: Other  (comment) (seated EOB)   PT Visit Diagnosis: Pain;Muscle weakness (generalized) (M62.81) Pain - Right/Left: Left Pain - part of body: Knee (and hand)     Time: 9157-9086 PT Time Calculation (min) (ACUTE ONLY): 31 min  Charges:    $Therapeutic Activity: 8-22 mins PT General Charges $$ ACUTE PT VISIT: 1 Visit                     Doyal Shams PT, DPT 10:13 AM,03/03/23

## 2023-03-03 NOTE — TOC Progression Note (Addendum)
 Transition of Care [facility]) - Progression Note    Patient Details  Name: Russell [name] MRN: [id number] Date of Birth: 1965-08-19  [facility]) CM/SW Contact  Royanne JINNY Bernheim, RN Phone Number: 03/03/2023, 12:21 PM  Clinical Narrative:     Met with the patient in the room, I explained his appeal rights for DC, he decided he would just go home, he stated that he will need EMS and has Cont Oxygen, I explained due to him being able to ambulate EMS may not be covered by Ins, he stated he uses them all the time and does  not ever pay it anyway but asked that I call EMS tol transport, he has his power chair at home and right Prothesis, EMS called to arrange transport to his home  Expected Discharge Plan: Home w Home Health Services Barriers to Discharge: Insurance Authorization  Expected Discharge Plan and Services   Discharge Planning Services: CM Consult   Living arrangements for the past 2 months: Single Family Home Expected Discharge Date: 03/02/23                         HH Arranged: PT, OT HH Agency: [facility]     Representative spoke with at [facility] Agency: kelsey   Social Determinants of Health (SDOH) Interventions SDOH Screenings   Food Insecurity: No Food Insecurity (02/27/2023)  Housing: Low Risk  (02/27/2023)  Transportation Needs: No Transportation Needs (02/27/2023)  Utilities: Not At Risk (02/27/2023)  Financial Resource Strain: Medium Risk (06/17/2022)   Received from [facility]  Tobacco Use: Low Risk  (02/26/2023)    Readmission Risk Interventions    10/25/2022    3:55 PM  Readmission Risk Prevention Plan  Transportation Screening Complete  PCP or Specialist Appt within 3-5 Days Complete  HRI or Home Care Consult Complete  Social Work Consult for Recovery Care Planning/Counseling Complete  Palliative Care Screening Not Applicable  Medication Review [employer]) Complete

## 2023-03-03 NOTE — Progress Notes (Signed)
 Progress Note   Patient: Russell [name] FMW:[id number] DOB: 23-Mar-1965 DOA: 02/27/2023     4 DOS: the patient was seen and examined on 03/03/2023   Brief hospital course: 57yo with h/o chronic combined CHF, HTN, OSA, DM, OSA not on CPAP, depression/anxiety, and morbid obesity with presented on 12/29 with a hand infection.  He was diagnosed with sepsis due to cellulitis/abscess and underwent I&D on 12/29.    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 presenting with infection of L hand Not septic Hand xray with infection/abscess but no apparent osteo Orthopedics consulted and did I&D on 12/29, left a penrose drain in place that was pulled on 12/30 Wound cultures + for MRSA, which may be treated with Doxy and Augmentin , per ID pharmacy Recommended for home health PT/OT, will also order RN for wound care Given significant PAD with multiple amputations, his overall morbidity/mortality risk is very high   Uncontrolled DM with hyperglycemia A1c was 12 in 09/2022 - very poorly controlled That said, he was not open to changes in his home regimen upon discussion today with DM coordinator (not taking home glargine, only using regular 10 units TID) Additionally, he was disgusted with the diabetic diet offered here, requested regular diet and double portions With these life choices, he appears unlikely to have significant ongoing healing and is likely to have significant ongoing complications Will order Semglee  20 units with AC insulin    PAD S/p R AKA Left leg also with chronic wounds, evidence of very poor circulation F/u with vascular as an outpatient   Dyslipidemia Continue atorvastatin    Anxiety and depression Continue Wellbutrin  XL and Cymbalta    HLD Continue Toprol  XL   GERD Continue pantoprazole    BPH No longer taking tamsulosin    AKI (acute kidney injury)  Likely prerenal due to volume depletion Resolved with IVF    Morbid Obesity Body mass index is 52.72 kg/m.SABRA  Weight loss should be encouraged Outpatient PCP/bariatric medicine/bariatric surgery f/u encouraged      Consultants: Orthopedics   Procedures: I&D 12/29   Antibiotics: Cefepime  x 1 dose Ceftriaxone  12/29-1/1 Vancomycin  12/29-1/1 Augmentin  1/1- Doxycycline1/1-  30 Day Unplanned Readmission Risk Score    Flowsheet Row ED to Hosp-Admission (Current) from 02/27/2023 in [facility] (1A)  30 Day Unplanned Readmission Risk Score (%) 39.22 Filed at 03/03/2023 0400       This score is the patient's risk of an unplanned readmission within 30 days of being discharged (0 -100%). The score is based on dignosis, age, lab data, medications, orders, and past utilization.   Low:  0-14.9   Medium: 15-21.9   High: 22-29.9   Extreme: 30 and above           Subjective: Refused to go home with home health yesterday.  He was also refusing today but agreed to go once Dilaudid  was discontinued.   Objective: Vitals:   03/02/23 1627 03/03/23 0143  BP: 124/63 136/71  Pulse: 71 72  Resp: (!) 22 16  Temp: 98.4 F (36.9 C) 97.8 F (36.6 C)  SpO2: 92% (!) 87%    Intake/Output Summary (Last 24 hours) at 03/03/2023 0727 Last data filed at 03/02/2023 1600 Gross per 24 hour  Intake 1446.42 ml  Output --  Net 1446.42 ml   Filed Weights   02/26/23 1952  Weight: (!) 171.5 kg    Exam:  General:  Appears calm and comfortable and is in  NAD Eyes:  EOMI, normal lids, iris ENT:  grossly normal hearing, lips & tongue, mmm Neck:  no LAD, masses or thyromegaly Cardiovascular:  RR with mild tachycardia.   Respiratory:   CTA bilaterally with no wheezes/rales/rhonchi.  Normal respiratory effort. Abdomen:  soft, NT, ND Skin:  marked LLE stasis changes with scattered excoriations Musculoskeletal:  s/p R AKA; L hand is splinted Psychiatric:  blunted mood and affect, speech fluent and appropriate, AOx3 Neurologic:   CN 2-12 grossly intact, moves all extremities in coordinated fashion  Data Reviewed: I have reviewed the patient's lab results since admission.  Pertinent labs for today include:   Stable BMP Wound cultures with abundant MRSA     Family Communication: None present  Disposition: Status is: Discharged     Time spent: 35 minutes  Unresulted Labs (From admission, onward)     Start     Ordered   03/03/23 0700  Vancomycin , random  Once-Timed,   TIMED        03/02/23 9140             Author: Delon Herald, MD 03/03/2023 7:27 AM  For on call review [email or web address].

## 2023-03-04 LAB — CULTURE, BLOOD (ROUTINE X 2)
Culture: NO GROWTH
Culture: NO GROWTH

## 2023-04-03 NOTE — H&P (Signed)
 Hospital Medicine Admission History & Physical  Time of Service: 04/03/2023, 1:45 AM  PCP: Eliverto [name], MD, Phone [phone], Fax [phone]   Chief Complaint  Wound check   History of Present Illness  Russell [name] is a 58 y.o. male  with PMH of  heart failure, atrial flutter, hypertension, OSA, morid obesity, hyperlipidemia ,type 2 DM , right AKA presented to left hand infection.  He had a recent left hand infection was taken to the OR on 12/29 at outside hospital placed on Penrose drain which was removed after day 1.  At that time wound culture was growing for Staph aureus was on IV antibiotic for 1 day and discharged home with p.o. doxycycline  and Augmentin , patient reports despite taking p.o. antibiotic left hand infection got worse and noticed discharge 2 weeks ago with pain which got worse prompting him to come for ER.  He denies chills, fever, chest pain, shortness of breath, abdominal pain, nausea, vomiting and urinary symptoms   In the ER temperature 97.3 F, 78, respiratory 16 and blood pressure 152/73-1 70/90.  Sodium 126, BUN 23, glucose 609, AST 14, ALT 11 alkaline phosphatase 159 and albumin  2.2.  WBC 7.2, hemoglobin 11 and hematocrit 36.3.  ESR greater than 130 and CRP 10.  Beta hydroxy butyrate <0.18, lactate 1.5.  Started on vancomycin .,  Received 1 L LR bolus and the 8 units regular insulin .    Medical History  Past Medical History Past Medical History:  Diagnosis Date  . Acute renal failure (ARF) (CMS-HCC) 08/12/2016  . Atrial flutter with rapid ventricular response (CMS/HHS-HCC) 03/11/2021  . Charcot's joint of foot, left 01/10/2017  . Chronic pain   . Chronic venous insufficiency    le edema  . Depression   . DIABETES MELLITUS    Type 2 diabetes, poorly controlled.  Peripheral neuropathy.on insulin .  . ED (erectile dysfunction) 10/2011  . Hammertoe 12/02/2011  . Heart disease    heart failure  . HEART FAILURE 11/04/2010  . HTN (hypertension)  11/04/2010  . Hyperlipidemia   . Hypertension   . Low back pain    chronic  . Morbid obesity (CMS/HHS-HCC)    56.4  . Neuropathy 11/04/2010  . Onychomycosis 12/02/2011  . Osteomyelitis (CMS/HHS-HCC)    Nonhealing right lower extremity fractures.  Right fibular fracture in March status post open reduction and internal fixation.  Status post tibia fracture in April, again status post surgery. Dr. Maryln of [place], [place]. Osteomyelitis s/p right below-the-knee amputation  . Sleep apnea   . Tinea pedis 12/02/2011    Past Surgical History Past Surgical History:  Procedure Laterality Date  . EGD @ [facility]  10/14/2022   Gastritis/No repeat/TKT  . EGD @ [facility]  10/26/2022   (Inpatient) Duodenal ulcers/Repeat PRN/CTL  . APPENDECTOMY    . BELOW KNEE LEG AMPUTATION    . FRACTURE SURGERY  1990's   right hand accident with finger fractures    Family History Family History  Problem Relation Name Age of Onset  . High blood pressure (Hypertension) Mother    . High blood pressure (Hypertension) Father    . Diabetes type II Father    . Coronary Artery Disease (Blocked arteries around heart) Father  2  . Stroke Father      Social History Social History   Socioeconomic History  . Marital status: Legally Separated    Spouse name: Idell  . Number of children: 3  Occupational History  . 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  Tobacco Use  . Smoking status: Never  . Smokeless tobacco: Never  Substance and Sexual Activity  . Alcohol use: No    Alcohol/week: 0.0 standard drinks of alcohol  . Drug use: No  . Sexual activity: Yes    Partners: Female  Social History Narrative   Lives alone in an apartment. Separated from his wife. No children of his own but has three grandchildren from his wife's former marriage. High school diploma. 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   Social Drivers of Health    Financial Resource Strain: Medium Risk (06/17/2022)   Overall Financial Resource Strain (CARDIA)   . Difficulty of Paying Living Expenses: Somewhat hard  Food Insecurity: No Food Insecurity (02/27/2023)   Received from [facility]   Hunger Vital Sign   . Worried About Programme researcher, broadcasting/film/video in the Last Year: Never true   . Ran Out of Food in the Last Year: Never true  Transportation Needs: No Transportation Needs (02/27/2023)   Received from [facility] - Transportation   . Lack of Transportation (Medical): No   . Lack of Transportation (Non-Medical): No    Allergies & Medications  Allergies Allergies  Allergen Reactions  . Prozac [Fluoxetine] Hallucination  . Sulfa (Sulfonamide Antibiotics) Hives  . Vancomycin  Analogues Vancomycin  Infusion Reaction  . Metformin Nausea  . Shellfish Containing Products Unknown    Medications Prior to Admission Medications  Prescriptions Last Dose Taking?  ACCU-CHEK FASTCLIX LANCET DRUM Past Week Yes  Sig: USE 3 TIMES DAILY TO CHECK BLOOD SUGAR  ACCU-CHEK GUIDE GLUCOSE METER Misc Past Month Yes  Sig: USE AS INSTRUCTED TO TEST BLOOD  SUGAR 3 TIMES DAILY  ACCU-CHEK GUIDE TEST STRIPS test strip Past Week Yes  Sig: TEST 3 TIMES DAILY  BD INSULIN  SYRINGE ULTRA-FINE 1 mL 30 gauge x 1/2 syringe 04/01/2023 Yes  Sig: USE AS DIRECTED  BD ULTRA-FINE ORIG PEN NEEDLE 29 gauge x 1/2 needle Past Week Yes  Sig: USE TO INJECT INSULIN  UNDER THE SKIN TWICE DAILY  DULoxetine  (CYMBALTA ) 30 MG DR capsule  No  Sig: TAKE 3 CAPSULES BY MOUTH ONCE DAILY  Patient taking differently: Take 90 mg by mouth once daily  DULoxetine  (CYMBALTA ) 30 MG DR capsule 04/01/2023 Yes  Sig: TAKE 1 CAPSULE BY MOUTH 3 TIMES  DAILY  FUROsemide  (LASIX ) 80 MG tablet 04/01/2023 Yes  Sig: Take 1 tablet (80 mg total) by mouth 2 (two) times daily  HUMULIN  R REGULAR U-100 INSULN injection (concentration 100 units/mL) 04/01/2023 Yes  Sig: INJECT 80 UNITS SUBCUTANEOUSLY 3 TIMES DAILY  BEFORE MEALS  ILEVRO 0.3 % DrpS  No  SANTYL  ointment Unknown No  TOUJEO  MAX U-300 SOLOSTAR 300 unit/mL (3 mL) InPn Unknown No  apixaban  (ELIQUIS ) 5 mg tablet  No  Sig: Take 1 tablet (5 mg total) by mouth every 12 (twelve) hours  Patient not taking: Reported on 03/16/2022  aspirin  81 MG chewable tablet 04/01/2023 Yes  Sig: Take by mouth  atorvastatin  (LIPITOR) 40 MG tablet 04/01/2023 Yes  Sig: Take 1 tablet (40 mg total) by mouth once daily  blood glucose meter kit  No  Sig: as directed  cetirizine (ZYRTEC) 10 MG tablet Past Week Yes  Sig: Take by mouth  diclofenac (VOLTAREN) 1 % topical gel 04/01/2023 Yes  Sig: Apply 2 g topically 4 (four) times daily  fluticasone  propionate (FLONASE ) 50 mcg/actuation nasal spray  No  Sig: Shake liquid & use 2 sprays in each nostril every day for allergies  insulin  GLARGINE-yfgn (SEMGLEE ) injection (concentration 100 units/mL)  No  Sig: Inject 50 Units subcutaneously daily with dinner for 30 days  Patient not taking: Reported on 03/16/2022  insulin  REGULAR (HUMULIN  R) injection (concentration 100 units/mL) 03/30/2023 No  Sig: Inject 15 Units subcutaneously 3 (three) times daily before meals Sliding scale  insulin  syringe-needle U-100 1 mL 31 gauge x 5/16 syringe More than a month No  Sig: Use as directed  lisinopriL  (ZESTRIL ) 10 MG tablet 04/01/2023 Yes  Sig: TAKE 1 TABLET BY MOUTH TWICE  DAILY  metoprolol  succinate (TOPROL -XL) 100 MG XL tablet 04/01/2023 Yes  Sig: Take 1 tablet (100 mg total) by mouth at bedtime  metoprolol  tartrate (LOPRESSOR ) 100 MG tablet  No  Sig: Take 1 tablet (100 mg total) by mouth at bedtime for 30 days  nortriptyline (PAMELOR) 25 MG capsule Unknown No  pen needle, diabetic 29 gauge x 1/2 needle  No  Sig: Use as directed  potassium chloride  (KLOR-CON  M20) 20 MEQ ER tablet More than a month No  Sig: TAKE 1 TABLET BY MOUTH TWICE  DAILY  pregabalin  (LYRICA ) 150 MG capsule More than a month No  Sig: Take 1 capsule (150 mg total)  by mouth 3 (three) times daily  semaglutide (OZEMPIC) 1 mg/dose (4 mg/3 mL) pen injector  No  Sig: Inject 0.75 mLs (1 mg total) subcutaneously once a week    Facility-Administered Medications: None     Review of Systems  A complete review of systems was performed and is negative except as reviewed in the HPI.  Physical Exam    Current Vital Signs 24h Vital Sign Ranges  T 36.3 C (97.4 F) (04/02/23 1945) Temp  Avg: 36.4 C (97.6 F)  Min: 36.3 C (97.3 F)  Max: 36.8 C (98.2 F)  BP (!) 170/90 (04/02/23 1945) BP  Min: 152/73  Max: 170/90  HR 80 (04/02/23 1945) Pulse  Avg: 79  Min: 78  Max: 80  RR 16 (04/02/23 1945) Resp  Avg: 16.7  Min: 16  Max: 18  O2sat 100 %   SpO2  Avg: 98 %  Min: 94 %  Max: 100 %  Weight     There is no height or weight on file to calculate BMI. General: alert, cooperative Eyes: conjunctiva clear, anicteric sclera HENT: oropharynx clear, moist mucous membranes Neck: no adenopathy, no JVD, supple, symmetrical, trachea midline, and no thyromegaly CV: regular rate and rhythm, without murmurs, rubs or gallops Resp: clear to auscultation, good air exchange Abd: normoactive bowel sounds , Distended and umbilical hernia noted.   Rectal: deferred Ext: no lower extremity edema, Right AKA  Left hand was dressed, patient refused to take of the dressing because of pain picture was taken earlier today.     Skin: As above Psych: oriented to time, place and person, mood and affect are appropriate Neuro: CN II-XII intact. Grossly normal and symmetric strength in upper and lower extremities. Intact sensation to light touch throughout.  Data   Recent Results (from the past 24 hours)  Complete Blood Count (CBC) with Differential   Collection Time: 04/02/23  5:25 PM  Result Value Ref Range   WBC (White Blood Cell Count) 7.2 3.2 - 9.8 x10^9/L   Hemoglobin 11.0 (L) 13.7 - 17.3 g/dL   Hematocrit 63.6 (L) 60.9 - 49.0 %   Platelets 295 150 - 450 x10^9/L   MCV (Mean  Corpuscular  Volume) 69 (L) 80 - 98 fL   MCH (Mean Corpuscular Hemoglobin) 20.8 (L) 26.5 - 34.0 pg   MCHC (Mean Corpuscular Hemoglobin Concentration) 30.3 (L) 31.5 - 36.3 %   RBC (Red Blood Cell Count) 5.30 4.37 - 5.74 x10^12/L   RDW-CV (Red Cell Distribution Width) 16.7 (H) 11.5 - 14.5 %   NRBC (Nucleated Red Blood Cell Count) 0.00 0 x10^9/L   NRBC % (Nucleated Red Blood Cell %) 0.0 %   MPV (Mean Platelet Volume) 9.4 7.2 - 11.7 fL   Neutrophil Count 5.1 2.0 - 8.6 x10^9/L   Neutrophil % 70.6 37 - 80 %   Lymphocyte Count 1.4 0.6 - 4.2 x10^9/L   Lymphocyte % 19.9 10 - 50 %   Monocyte Count 0.5 0 - 0.9 x10^9/L   Monocyte % 6.7 0 - 12 %   Eosinophil Count 0.16 0 - 0.70 x10^9/L   Eosinophil % 2.2 0 - 7 %   Basophil Count 0.02 0 - 0.20 x10^9/L   Basophil % 0.3 0 - 2 %   Slide Review/Morphology Yes    Immature Granulocyte Count 0.02 <=0.06 x10^9/L   Immature Granulocyte % 0.3 <=0.7 %  Comprehensive Metabolic Panel (CMP)   Collection Time: 04/02/23  5:25 PM  Result Value Ref Range   Sodium 126 (L) 135 - 145 mmol/L   Potassium 4.9 3.5 - 5.0 mmol/L   Chloride 91 (L) 98 - 108 mmol/L   Carbon Dioxide (CO2) 26 21 - 30 mmol/L   Urea Nitrogen (BUN) 23 (H) 7 - 20 mg/dL   Creatinine 0.9 0.6 - 1.3 mg/dL   Glucose 390 (HH) 70 - 140 mg/dL   Calcium  9.0 8.7 - 10.2 mg/dL   AST (Aspartate Aminotransferase) 14 (L) 15 - 41 U/L   ALT (Alanine Aminotransferase) 11 (L) 15 - 50 U/L   Bilirubin, Total 0.3 (L) 0.4 - 1.5 mg/dL   Alk Phos (Alkaline Phosphatase) 159 (H) 24 - 110 U/L   Albumin  2.2 (L) 3.5 - 4.8 g/dL   Protein, Total 8.1 6.2 - 8.1 g/dL   Anion Gap 9 3 - 12 mmol/L   BUN/CREA Ratio 26 6 - 27   Glomerular Filtration Rate (eGFR)  99 mL/min/1.73sq m  Sedimentation Rate-Automated   Collection Time: 04/02/23  5:25 PM  Result Value Ref Range   Sedimentation Rate-Automated >130 (H) <20 mm/hr  C-Reactive Protein (CRP), Inflammatory   Collection Time: 04/02/23  5:25 PM  Result Value Ref Range   CRP  (C-reactive Protein, Inflammatory) 10.01 (H) <=0.85 mg/dL  Prothrombin Time (INR)   Collection Time: 04/02/23  5:25 PM  Result Value Ref Range   Prothrombin Time 12.0 9.5 - 13.1 sec   Prothrombin INR 1.0 0.9 - 1.1  Activated Partial Thromboplastin Time (APTT)   Collection Time: 04/02/23  5:25 PM  Result Value Ref Range   Act Partial Thromboplastin Time 26.2 (L) 26.8 - 37.1 sec  Beta-Hydroxybutyrate   Collection Time: 04/02/23  6:52 PM  Result Value Ref Range   Beta-Hydroxybutyrate <0.18 <0.40 mmol/L  Shock Panel, Venous   Collection Time: 04/02/23  6:52 PM  Result Value Ref Range   Patient Temperature, Venous 37.0 C   FIO2, Venous     pH, Venous 7.39 7.32 - 7.42   PCO2, Venous 49 39 - 55 mmHg   PO2, Venous 41 30 - 55 mmHg   Base Excess, Venous 4 (H) -3 - 3 mmol/L   Bicarbonate, Venous 30 (H) 20 - 28 mmol/L   CO2  TOTAL, VENOUS 31 (H) 21 - 30 mmol/L   Hemoglobin, Venous 11.2 (L) 13.7 - 17.3 g/dL   Hematocrit, Venous 65.9 (L) 39.0 - 49.0 %   % O2 Hemoglobin, Venous 64.2 60.0 - 85.0 %   % CO Hemoglobin, Venous 1.3 <=2.0 %   % Methemoglobin, Venous 0.3 (L) 0.4 - 1.5 %   Volume % O2, Venous 10.1 7.0 - 18.0 %   Sodium, Venous 126 (L) 135 - 145 mmol/L   Potassium, Venous 5.1 (H) 3.2 - 4.8 mmol/L   Glucose, Nonfasting, Venous 673 (HH) 70 - 140 mg/dL   Calcium , Ionized 1.21 1.15 - 1.32 mmol/L   Lactate, Venous 1.5 0.6 - 2.2 mmol/L  POC GLUCOSE   Collection Time: 04/02/23  8:54 PM  Result Value Ref Range   POC Glucose 516 (HH) 70 - 140 mg/dL  POC GLUCOSE   Collection Time: 04/02/23 11:24 PM  Result Value Ref Range   POC Glucose 415 (HH) 70 - 140 mg/dL  ECG 87-ozji   Collection Time: 04/03/23  1:28 AM  Result Value Ref Range   Vent Rate (bpm) 90    PR Interval (msec) 174    QRS Interval (msec) 104    QT Interval (msec) 368    QTc (msec) 450     EKG: Pending  Radiology Studies on Admission: X-ray hand left minimum 3 views  Result Date: 04/02/2023 XR WRIST LEFT 3 PLUS  VIEWS, XR HAND LEFT MINIMUM 3 VIEWS, XR RADIUS ULNA LEFT 2 VIEWS Number of views: 3 views left hand, 3 views left wrist, 2 views left radius/ulna.. Indication:  WOUND CHECK, EYE PROBLEM, M79.642 Pain in left hand. Comparison: None. Findings: Hand/wrist: No acute fracture or dislocation. Amputation at the distal aspect of the middle finger proximal phalanx. Multiple areas of cortical irregularities/ostial lysis, for example at the proximal aspect of the index finger metatarsal and at the proximal aspect of the pinky finger metatarsal. Moderate to severe degenerative changes of the first MCP joint.. Diffuse soft tissue swelling of the hand and wrist. Lucencies in the palmar aspect of the soft tissues may represent gas. Radius/ulna: No acute fracture or dislocation. Joint spaces are maintained without significant degenerative changes. The soft tissues are unremarkable. Impression: Multiple areas of cortical irregularity as described above concerning for osteomyelitis. Can consider MRI for further evaluation. Findings discussed with [place], [place] via telephone by Romilda Pate, MD at 7:35 PM on 04/02/2023. The preliminary report (critical or emergent communication) was reviewed prior to this dictation and there are no critical differences between the preliminary results and the impressions in this final report.  Electronically Reviewed by:  Romilda Pate, MD, [name] Electronically Reviewed on:  04/02/2023 7:48 PM I have reviewed the images and concur with the above findings. Electronically Signed by:  Dorise Pali, MDPhD,[name] Electronically Signed on:  04/02/2023 8:19 PM  X-ray wrist left 3 plus views  Result Date: 04/02/2023 XR WRIST LEFT 3 PLUS VIEWS, XR HAND LEFT MINIMUM 3 VIEWS, XR RADIUS ULNA LEFT 2 VIEWS Number of views: 3 views left hand, 3 views left wrist, 2 views left radius/ulna.. Indication:  WOUND CHECK, EYE PROBLEM, M79.642 Pain in left hand. Comparison: None. Findings: Hand/wrist: No acute  fracture or dislocation. Amputation at the distal aspect of the middle finger proximal phalanx. Multiple areas of cortical irregularities/ostial lysis, for example at the proximal aspect of the index finger metatarsal and at the proximal aspect of the pinky finger metatarsal. Moderate to severe degenerative changes of the first  MCP joint.. Diffuse soft tissue swelling of the hand and wrist. Lucencies in the palmar aspect of the soft tissues may represent gas. Radius/ulna: No acute fracture or dislocation. Joint spaces are maintained without significant degenerative changes. The soft tissues are unremarkable. Impression: Multiple areas of cortical irregularity as described above concerning for osteomyelitis. Can consider MRI for further evaluation. Findings discussed with [place], [place] via telephone by Romilda Pate, MD at 7:35 PM on 04/02/2023. The preliminary report (critical or emergent communication) was reviewed prior to this dictation and there are no critical differences between the preliminary results and the impressions in this final report.  Electronically Reviewed by:  Romilda Pate, MD, [name] Electronically Reviewed on:  04/02/2023 7:48 PM I have reviewed the images and concur with the above findings. Electronically Signed by:  Dorise Pali, MDPhD,[name] Electronically Signed on:  04/02/2023 8:19 PM  X-ray radius ulna left 2 views  Result Date: 04/02/2023 XR WRIST LEFT 3 PLUS VIEWS, XR HAND LEFT MINIMUM 3 VIEWS, XR RADIUS ULNA LEFT 2 VIEWS Number of views: 3 views left hand, 3 views left wrist, 2 views left radius/ulna.. Indication:  WOUND CHECK, EYE PROBLEM, M79.642 Pain in left hand. Comparison: None. Findings: Hand/wrist: No acute fracture or dislocation. Amputation at the distal aspect of the middle finger proximal phalanx. Multiple areas of cortical irregularities/ostial lysis, for example at the proximal aspect of the index finger metatarsal and at the proximal aspect of the pinky  finger metatarsal. Moderate to severe degenerative changes of the first MCP joint.. Diffuse soft tissue swelling of the hand and wrist. Lucencies in the palmar aspect of the soft tissues may represent gas. Radius/ulna: No acute fracture or dislocation. Joint spaces are maintained without significant degenerative changes. The soft tissues are unremarkable. Impression: Multiple areas of cortical irregularity as described above concerning for osteomyelitis. Can consider MRI for further evaluation. Findings discussed with [place], [place] via telephone by Romilda Pate, MD at 7:35 PM on 04/02/2023. The preliminary report (critical or emergent communication) was reviewed prior to this dictation and there are no critical differences between the preliminary results and the impressions in this final report.  Electronically Reviewed by:  Romilda Pate, MD, [name] Electronically Reviewed on:  04/02/2023 7:48 PM I have reviewed the images and concur with the above findings. Electronically Signed by:  Dorise Pali, MDPhD,[name] Electronically Signed on:  04/02/2023 8:19 PM   Assessment & Plan  Reshard Guillet is a 58 y.o. male admitted for the following problems: Principal Problem:   Wound drainage Active Problems:   Hyperlipidemia   Diabetes mellitus type 2, insulin  dependent (CMS-HCC)   Essential hypertension   Hyperglycemia   #Left hand wound #Concern for osteomyelitis  Patient with history of multiple left hand infection, was recently on  2/29 at outside hospital  had I&D placed on Penrose drain which was removed after day 1.  At that time wound culture was growing for Staph aureus was on IV antibiotic for 1 day and discharged home with p.o. doxycycline  and Augmentin , patient reports despite taking p.o. antibiotic left hand infection got worse and noticed discharge 2 weeks ago with pain which got worse. X ray was concerned for osteomyelitis.  Ortho was consulted.   RCRI Class I. Charlanne w/  6 %  risk of MI or cardiac arrest. NSQIP 5.4  risk of serious complication. No further preoperative evaluation needed prior to OR.   -Will start with broad-spectrum antibiotic vancomycin  and Zosyn . -Weightbearing as tolerated left upper extremity. -Follow-up  MRI of results. -PRN tylenol  and oxycodone   -N.p.o. at midnight for procedure in the morning -Patient is not taking Eliquis  for more than a year reports provider stopped. Will hold prophylaxis anticoagulant for now, pending procedure. -Type and screen and coag lab ordered. -Trend WBC and inflammatory markers.     #Hyperglycemia  #Type 2 diabetes mellitus Reports supposed to take 80 units regular insulin  3 times daily,  but taking only once if glucose was high in 300's otherwise not taking, reports he is not checking blood glucose at home regularly, because of vision loss. He is not taking any long acting insulin . It appears patient is not compliance  with insulin , blood glucose in the ER 600s but not DKA, received 8 units regular insulin  trending down now.  Patient poor glucose control may have contributed for worsening of left hand wound.  He is n.p.o. now we will treat with sliding scale insulin  every 6 hours, he will be benefited with the diabetic education, will consult and also social worker for possible home health on discharge.  He lives by himself.  -SSI regular insulin  every 6 hours. -Follow-up A1c results. -Social worker consulted -Diabetic education consulted  #History of atrial flutter -He is taking metoprolol  XL 100 mg, will start with metoprolol  tartrate 12.5 every 6 hours.  Titrate as needed. -He is not on Eliquis . -Telemetry    #History of heart failure #History of hypertension #History of hyperlipidemia  -Resume furosemide  and lisinopril  after procedure -Continue home medication Lipitor 40 mg daily.  #History of depression -Denies suicidal thoughts will continue home medication Cymbalta  30 mg twice daily  #Concern  for diabetic retinopathy -Patient reports not able to see in the right eye and left eye is blurry, was planning to have procedure 6 months ago but did not keep up.  -Consult ophthalmology. -Fall precaution   Comorbid Conditions: Nutritional Disorders:    Hypoalbuminemia:  Hypoalbuminemia present with lowest albumin  of 2.2.   Hypoalbuminemia is associated with increased risk for patients.  We will attempt to treat the underlying condition(s) contributing to this low albumin  state. Electrolyte Disorders:    Hyponatremia:  Hyponatremia present with lowest sodium of 126.  Will continue to monitor.          VTE Prophylaxis:   Code Status: Full Code SCDs (sequential compression device) Patient Class & Status: Inpatient,   Discharge Planning: Anticipated Discharge Needs: PT, OT, and home health   Tomi Ova Hightower, NP TOMI OVA HIGHTOWER, NP  [facility] 04/03/2023 1:45 AM   Effective July 31, 2022, [facility], [facility], and/or [facility] refer to the [facility] of [facility].

## 2023-04-05 NOTE — Procedures (Addendum)
 Operative Note   SURGERY DATE: 04/05/2023  PRE-OP DIAGNOSIS: Infection of forearm [L08.9]    POST-OP DIAGNOSIS: Post-Op Diagnosis Codes:    * Infection of forearm [L08.9]   Procedure(s): AMPUTATION, FOREARM, THROUGH RADIUS AND ULNA (Left) NERVE PEDICLE TRANSFER; ARM; FIRST STAGE (Left) GRAFTING OF AUTOLOGOUS SOFT TISSUE, OTHER, HARVESTED BY DIRECT EXCISION (EG, FAT, DERMIS, FASCIA) (Left) IMPLANTATION OF NERVE END INTO BONE OR MUSCLE (LIST IN ADDITION TO NEUROMA EXCISION) (Left)  SURGEON: Surgeons and Role:    * Mickey Marylu Rogers, MD - Primary    * Arloa Sluder, MD - Fellow     * Lucillie Marylen Mutton, MD - Assistant   ASSISTANT(S): above   STAFF: Circulator: Lamona Berry, RN  ANESTHESIA: MAC / Regional   INDICATION(S):  83M with a PMH of uncontrolled T2DM presenting with a chronically infected left upper extremity with extension through the forearm. He is status-post irrigation and debridement on 04/03/23 with concerns for infection extending proximally and was indicated for operative intervention for a transradial amputation for definitive control.   There was an extensive discussion had with the patient regarding the nature of amputations, and the nature of wound healing, infection scar maturation, hypersensitivity, symptomatic neuroma formation, pain at the stump, elbow and shoulder stiffness were reviewed.  I reviewed the potential for posttraumatic neuroma formation and the symptoms related to this and the goal to minimize this with TMR and RPNI.  I have discussed role of therapy after surgery for phantom limb pain minimization with mirror therapy and role of desensitization of the incision.    The risks benefits and alternatives of amputation were discussed with the patient. Risks included pain, stiffness, swelling, injury to neurovascular or musculoskeletal structure, need for reoperation, thromboembolic event, death, and related anesthetic complications. Benefits included  improvement in symptoms and overall improved function of the arm. Patient was amenable and signed the informed consent     OPERATIVE REPORT:   The patient was met in the preoperative holding area and the left upper extremity was appropriately identified and marked.  The operative consent was reviewed and signed by the patient.  The patient was transported to the operating room and positioned supine on a well-padded operating room table.  The patient was induced with anesthesia. The patient received preoperative antibiotics on his regularly scheduled regimen.  The operative extremity was appropriately prepped and draped in normal sterile fashion.  A formal time-out was performed.  We turned our attention to the left upper extremity. The procedure was begun by drawing out a fishmouth incision through heathy tissue.  Incision was made with a scalpel over the marked flaps.  This was made through the skin and subcutaneous tissue down through fascia.  Bleeding was controlled with a bipolar. The radial sensory nerve was identified and neurolysis was performed proximally and distally. It was cut as distal as possible. The radial artery was then then identified and clipped. Next the median nerve was identified and neurolysis was performed proximally and distally. It was cut as distal as possible. The ulnar artery was identified and clipped. The flexor tendons were cut at the musculotendinous junction. Using an Technical brewer to protect the soft tissues, radius and ulna osteotomies were performed at the mid forearm level outside the zone of the infectious tissue. The bone edges were rasped with an oscillating saw and felt to be smooth.  Next we turned our attention to the RPNI portion of the procedure. Two 3X2X1cm areas of muscle from the extensor carpi radialis longus  and brevis and this was stored in a moist sponge. Nerve transfer was performed of the transected ulnar nerve to the median nerve. This was secured using 6-0  prolene and fibrin glue. An RPNI muscle wrap was then placed around the coaptation secured with prolene. This was then placed deep into the forearm. Next, the radial sensory nerve was sharply transected and overwrapped with the second muscle graft and secured with 6-0 prolene and fibrin glue. Again this was secured into the forearm soft tissue. Next, the tourniquet was released and hemostasis was achieved. The wound at this time was carefully irrigated. Excess tendon length was trimmed and a myodesis was performed over the radius and ulna stumps using 2-0 maxon. The wound was then closed in a layered fashion using 3-0 Monocryl and 4-0 nylon in a simple fashion.  Xeroform, sterile gauze, soft wrap, a were then placed on the operative extremity.   All counts were correct at the end of the case. At the conclusion of the case, anesthesia team placed regional blocks.  The patient was awakened from anesthesia, seemed to tolerate the procedure well and was transported to PACU awake in stable condition.   ESTIMATED BLOOD LOSS: 300 mL    TOTAL IV FLUIDS: see anesthesia record   SPECIMENS:  none  IMPLANTS:  Implant Name Type Inv. Item Serial No. Manufacturer Lot No. LRB No. Used Action  SYRINGE, PRE-FILLED PRIMA TISSEEL - SNA  SYRINGE, PRE-FILLED PRIMA TISSEEL NA [employer] [id number] Left 1 Implanted     COMPLICATIONS: none  DISPOSITION: PACU - hemodynamically stable.  ATTESTATION:  TP- Surgery (With Asst Surgeon) - I personally performed the procedure with the assistance of Dr. Lucillie as an assistant surgeon/assistant at surgery as a qualified resident was not available for this procedure (TP).

## 2023-04-08 NOTE — Progress Notes (Signed)
  [facility]  Physical Therapy  ATTEMPTED VISIT NOTE  Patient Name:  Russell [name] Room/Bed: 8331/8331-01    Attempted to see patient for treatment on this date.   The patient is unavailable for therapy. Pt currently off the floor to hyperbaric treatment. Will follow up schedule permitting, if able.   GLEAN CHILES, PT

## 2023-04-08 NOTE — Progress Notes (Signed)
 Nutrition Initial Assessment  Reason for visit: [facility] next level screen (Wound)  Admission Diagnosis / Current Problem: Per H&P  Russell [name] is a 58 y.o. male  with PMH of  heart failure, atrial flutter, hypertension, OSA, morid obesity, hyperlipidemia ,type 2 DM , right AKA presented to left hand infection.  He had a recent left hand infection was taken to the OR on 12/29 at outside hospital placed on Penrose drain which was removed after day 1.  At that time wound culture was growing for Staph aureus was on IV antibiotic for 1 day and discharged home with p.o. doxycycline  and Augmentin , patient reports despite taking p.o. antibiotic left hand infection got worse and noticed discharge 2 weeks ago with pain which got worse prompting him to come for ER.  He denies chills, fever, chest pain, shortness of breath, abdominal pain, nausea, vomiting and urinary symptoms.  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     Nutrition Assessment: Anthropometrics  Admit Height: 180.3 cm (5' 11) (04/07/23 1727)  Admit Weight: (!) 152.5 kg (336 lb 3.2 oz) (04/07/23 1727) Admit BMI: 48.7  kg/m - adjusted for amputation (BKA +forearm+ arm)  Ideal body weight: 179 lb 0.2 oz (81.2 kg) (BMI 25) 178 lb (80.9 kg) - Adjusted for amputation Usual body weight: UTA  Weight History: Wt Readings from Last 20 Encounters:  04/07/23 (!) 152.5 kg (336 lb 3.2 oz)  06/17/22 (!) 170.6 kg (376 lb)  03/16/22 (!) 172.4 kg (380 lb)  12/24/21 (!) 174.2 kg (384 lb)  06/15/21 (!) 180.1 kg (397 lb)  04/06/21 (!) 177.4 kg (391 lb)  03/10/21 (!) 176 kg (388 lb 0.2 oz)   Care everywhere: 02/26/23 171.5 kg (387 lb) 11/19/22 174 kg (383 lb)  % Weight change: 11% (51 lb) weight loss x 2 months.   Weights this admission:See admission weight   Nutrition History:   Food Allergies: Shellfish containing product  Intake: UTA, pt was at hyperbaric  Current Nutrition: Diet Order: Diet carbohydrate  level 2 (60 gm/meal)  Intake: 75-100% po intake per RN. Occasionally >50% due to having to leave for hyperbaric   Date/Time Percentage of meal eaten Nutritional supplement (mL) Meal  04/08/23 1100 76-100% 0 mL Lunch  04/08/23 0700 76-100% 0 mL Breakfast  04/08/23 0431 -- 0 mL --  04/07/23 2258 -- 0 mL --  04/07/23 2000 -- 0 mL --  04/07/23 1600 -- -- Dinner  04/07/23 1153 -- 0 mL --  04/07/23 1100 -- 0 mL Lunch  04/07/23 0700 25-50% 0 mL Breakfast  04/07/23 0328 -- 0 mL --  04/07/23 0047 -- 0 mL --  04/06/23 2011 -- 0 mL --  04/06/23 1700 76-100% -- Dinner  04/06/23 1237 76-100% -- Lunch  04/06/23 0809 76-100% -- Breakfast  04/04/23 1208 76-100% 0 mL Lunch  04/04/23 0923 76-100% 0 mL Breakfast    Nutrition-Focused Findings: Physical Assessment: deferred (hyperbaric) (04/08/23 1449)  Last BM: 04/04/23 Skin: Incision Edema: Trace generalized/RUE/RLE, +1 LEU/LLE   Biochemical Data and Medication (nutritionally relevant): Labs:  Recent Labs  Lab 04/05/23 0142 04/06/23 0827 04/07/23 0544 04/08/23 0522  NA 131* 130* 133* 135  K 4.6 4.9 5.1* 4.3  CL 97* 96* 101 100  CO2 23 25 19* 25  BUN 22* 19 24* 20  CREATININE 1.1 1.2 1.6* 1.2  GLUCOSE 274* 396* 261* 73  CALCIUM  8.4* 8.4* 8.1* 8.5*  PHOS 3.3 3.5  --  3.9   Recent Labs  Lab  04/06/23 0827 04/07/23 0544 04/08/23 0522  MG 2.0 2.1 2.2   Recent Labs    04/07/23 2008 04/08/23 0632 04/08/23 0755 04/08/23 1042 04/08/23 1150  POCGLU 95 78 72 115 108   A1C 11.9 (04/01/22)  Medications: Scheduled: insulin  (long and fast acting ), senokot  Estimated Nutritional Needs: Calculation Weight Used: Admission weight (152.5 kg) Energy: 2287 -2440 kcal (15-16 kcal/kg) Protein: 97-121 g (1.2-1.5 g/kg) Fluid: 24ml/kcal or per team  Nutrition Evaluation: Pt assessed following [facility] screen for wound. Pt presents with non healing wound, s/p left transradial amputation. Pt with hx of T2DM (A1C 11.9) and BKA (per PMHX).   UTA  po intake PTA (pt was at hyperbaric), suspect non adherence to carbohydrate controlled diet. On long acting and fast acting insulin .  Nutritionally, pt appears to be doing well. Has been consuming most of his meals on admission per RN. RN report of constipation, pt with stool burden per xray 04/07/23, conservatively managed (on bowel regimen)  Nutrition Diagnosis: -NB-1.6 Limited adherence to nutrition-related recommendations related to T2DM as evidenced by elevated A1C  Malnutrition Assessment: Diagnosis: insufficient evidence available to diagnose malnutrition (04/08/23 1500)   Criteria:  n/a Intervention:  N/a  Nutrition Interventions / Recommendations:  Continue carb level 2 diet Adequate manage glucose levels  Weigh at least 1-2 x weekly to trend  Bowel regimen for regularity    Assessed at: 1400  Time Spent: 60 Minute(s)   Mardeen Carney, MPH, RD, LDN Clinical Dietitian

## 2023-04-10 NOTE — Discharge Summary (Addendum)
 [facility] Medicine Discharge Summary  Admit Date: 04/02/2023 Discharge Date: 04/22/2023  Admitting Physician: Dorothe Humphrey Mau, MD Discharge Physician: Virl Holmes Sprang, MD  Primary Care Provider: Eliverto [name], MD, Phone [phone]  Discharge Destination: Skilled nursing facility: [facility]  Admission Diagnoses:  Hyperglycemia [R73.9] Hand pain, left [M79.642] Diabetes mellitus type 2, insulin  dependent (CMS/HHS-HCC) [E11.9, Z79.4] Wound drainage [T14.8XXA] Essential hypertension [I10] Chronic diastolic (congestive) heart failure (CMS/HHS-HCC) [I50.32]  Discharge Diagnoses:  Principal Problem:   Wound drainage Active Problems:   Hyperlipidemia   Diabetes mellitus type 2, insulin  dependent (CMS-HCC)   Essential hypertension   Hyperglycemia   Infection of forearm   Failed flap Resolved Problems:   Depression, unspecified  Primary Diagnosis: Admitted for left hand infection now s/p amputation  Changes Made (with rationale):  Added doxycycline  and Keflex  for cellulitis of left stump through 2/26 Changed duloxetine  to BID as it is felt there is not increased benefit to higher dose Decreased metoprolol  for HR/BP Stopped lisinipril d/t soft BP Re-started flomax  for urinary retention  Unclear why his outpatient insulin  regimen is regular insulin  as opposed to lispro or aspart but this seems to have been the case for many years Re-started aspirin  (previously on DAPT this was held in the past for GIB) Was no longer taking Eliquis  so this was stopped   To-Do List (incidental findings, follow-up studies, etc.): BMP and CBC  Anticipatory Guidance for Outpatient Care:  Resume using left stump shrinker on 2/24 Has orthopedics appointment 3/6 - expected that they will move this up to next week to check on status of cellulitis Has ophthalmology appointment 2/26 which is imperative that patient attend as addressing his vision will help his ability to  rehab Have placed referral for cardiology to re-establish care Have placed referral to mental health for depression    Results Pending at Discharge:  None Please see phone numbers at end of this summary for lab contact information.   Follow-up/Care Transition Plan: Sched. appts: Future Appointments  Date Time Provider Department Center  04/27/2023  2:15 PM Wisely, Ivonne Lever, MD [facility]  05/05/2023  9:00 AM Tobie Burgess [place], MD [facility]    Follow-up info: [facility] [address] [place]  [address] [phone]        Allergies/Intolerances:  Allergies  Allergen Reactions  . Prozac [Fluoxetine] Hallucination  . Sulfa (Sulfonamide Antibiotics) Hives  . Vancomycin  Analogues Vancomycin  Infusion Reaction  . Metformin Nausea  . Shellfish Containing Products Unknown     New Adverse Drug Events: none  Medications:   Current Discharge Medication List     START taking these medications   Details  acetaminophen  (TYLENOL ) 325 MG tablet Take 3 tablets (975 mg total) by mouth every 8 (eight) hours as needed for Pain for up to 10 days    aspirin  81 MG EC tablet Take 1 tablet (81 mg total) by mouth once daily    cephalexin  (KEFLEX ) 500 MG capsule Take 1 capsule (500 mg total) by mouth every 6 (six) hours for 6 days    doxycycline  monohydrate 100 mg Tab tablet Take 1 tablet (100 mg total) by mouth every 12 (twelve) hours for 6 days    methocarbamoL  (ROBAXIN ) 500 MG tablet Take 1 tablet (500 mg total) by mouth 3 (three) times daily as needed for up to 10 days    oxyCODONE  (OXYIR) 10 mg immediate release tablet Take 1-1.5 tablets (10-15 mg total) by mouth every 4 (four) hours as needed for up to  5 days Qty: 20 tablet, Refills: 0    polyethylene glycol (MIRALAX ) packet Take 1 packet (17 g total) by mouth 2 (two) times daily Mix in 4-8ounces of fluid prior to taking.    sennosides-docusate (SENOKOT-S) 8.6-50 mg tablet Take 2 tablets  by mouth 2 (two) times daily    tamsulosin  (FLOMAX ) 0.4 mg capsule Take 1 capsule (0.4 mg total) by mouth once daily Take 30 minutes after same meal each day.    white petrolatum gel Apply topically 2 (two) times daily       CONTINUE these medications which have CHANGED   Details  DULoxetine  (CYMBALTA ) 30 MG DR capsule Take 1 capsule (30 mg total) by mouth 2 (two) times daily   Associated Diagnoses: Chronic painful diabetic neuropathy (CMS/HHS-HCC)    insulin  GLARGINE-yfgn (SEMGLEE ) injection (concentration 100 units/mL) Inject 0.5 mLs (50 Units total) subcutaneously at bedtime for 30 days    insulin  REGULAR (HUMULIN  R REGULAR U-100 INSULN) injection (concentration 100 units/mL) Inject 10 Units subcutaneously 3 (three) times daily before meals   Associated Diagnoses: Diabetes mellitus type 2, insulin  dependent (CMS/HHS-HCC)    metoprolol  SUCCinate (TOPROL -XL) 50 MG XL tablet Take 1 tablet (50 mg total) by mouth at bedtime   Associated Diagnoses: Atrial flutter, unspecified type (CMS/HHS-HCC)    potassium chloride  (KLOR-CON  M20) 20 MEQ ER tablet Take 1 tablet (20 mEq total) by mouth once daily    pregabalin  (LYRICA ) 75 MG capsule Take 1 capsule (75 mg total) by mouth 2 (two) times daily Qty: 15 capsule, Refills: 0   Associated Diagnoses: Chronic painful diabetic neuropathy (CMS/HHS-HCC); Peripheral polyneuropathy       CONTINUE these medications which have NOT CHANGED   Details  FUROsemide  (LASIX ) 80 MG tablet Take 1 tablet (80 mg total) by mouth 2 (two) times daily Qty: 180 tablet, Refills: 3   Associated Diagnoses: Peripheral edema    ACCU-CHEK FASTCLIX LANCET DRUM USE 3 TIMES DAILY TO CHECK BLOOD SUGAR Qty: 102 each, Refills: 0    ACCU-CHEK GUIDE GLUCOSE METER Misc USE AS INSTRUCTED TO TEST BLOOD  SUGAR 3 TIMES DAILY Qty: 1 each, Refills: 0   Associated Diagnoses: Diabetes mellitus type 2, insulin  dependent (CMS/HHS-HCC)    ACCU-CHEK GUIDE TEST STRIPS test strip TEST 3  TIMES DAILY Qty: 300 strip, Refills: 2    atorvastatin  (LIPITOR) 40 MG tablet Take 1 tablet (40 mg total) by mouth once daily Qty: 100 tablet, Refills: 2   Associated Diagnoses: Hyperlipidemia, mixed    BD INSULIN  SYRINGE ULTRA-FINE 1 mL 30 gauge x 1/2 syringe USE AS DIRECTED Qty: 300 each, Refills: 2   Associated Diagnoses: Uncontrolled type 2 diabetes mellitus with hyperglycemia (CMS/HHS-HCC)    BD ULTRA-FINE ORIG PEN NEEDLE 29 gauge x 1/2 needle USE TO INJECT INSULIN  UNDER THE SKIN TWICE DAILY    cetirizine (ZYRTEC) 10 MG tablet Take 10 mg by mouth once daily    diclofenac (VOLTAREN) 1 % topical gel Apply 2 g topically 4 (four) times daily Qty: 720 g, Refills: 3   Associated Diagnoses: Chronic pain of left knee    fluticasone  propionate (FLONASE ) 50 mcg/actuation nasal spray Shake liquid & use 2 sprays in each nostril every day for allergies Qty: 16 g, Refills: 10    insulin  syringe-needle U-100 1 mL 31 gauge x 5/16 syringe Use as directed Qty: 100 each, Refills: 12       STOP taking these medications     aspirin  81 MG chewable tablet      apixaban  (  ELIQUIS ) 5 mg tablet      lisinopriL  (ZESTRIL ) 10 MG tablet      metoprolol  tartrate (LOPRESSOR ) 100 MG tablet      semaglutide (OZEMPIC) 1 mg/dose (4 mg/3 mL) pen injector          Brief History of Present Illness:   Legrande Hao is a 59 y.o. male  with PMH of  heart failure, atrial flutter, hypertension, OSA, morid obesity, hyperlipidemia ,type 2 DM , right AKA presented to left hand infection.  He had a recent left hand infection was taken to the OR on 12/29 at outside hospital placed on Penrose drain which was removed after day 1.  At that time wound culture was growing for Staph aureus was on IV antibiotic for 1 day and discharged home with p.o. doxycycline  and Augmentin , patient reports despite taking p.o. antibiotic left hand infection got worse and noticed discharge 2 weeks ago with pain which got worse  prompting him to come for ER.  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 Underwent left palmar and forearm irrigation and debridement as well as carpal tunnel release, including bulk resection of most of flexor tendons. The following day, (2/4) underwent L transradial amputation 2/4. Per discussion with surgical team, patient was debrided back to healthy tissue and can assume source control therefore completed antibiotics in the days after surgery. Completed hyperbarics treatment. Started on doxycycline  and Keflex  on 2/19 for cellulitis.  - Will follow up in [name] scheduled for 05/05/23, though this appointment is expected to be moved up to check on status of cellulitis - Wound care:   -Gently clean off all peri-incisional/wound crusting with gauze soaked with half-diluted peroxide. Incisions should get a thin layer of vaseline (keeping them greasy at all times)  -consulted limb loss team. LUE shrinker in place. - Activity restriction: NWB L hand, elevate hand as much as possible  Uncontrolled Type 2 diabetes mellitus, A1C 11.9%, difficulty taking insulin  Reports supposed to take 80 units regular insulin  3 times daily,  but taking only once if glucose was high in 300's. Not checking blood glucose at home regularly, because of vision loss. Given vision complaints, he will have a hard time safely giving himself insulin . He has tried several other antihyperglycemic medications in the past but self-discontinued. Unclear why he is on regular as opposed to lispro or aspart but seems to have been on this for many years.  Discharge regimen as above with a diabetic diet.  PAD LLE ABI c/w calcification. Obtained CTA with runoff which showed no appreciable atherosclerotic disease and three vessel runoff to left foot. Resumed aspirin  (previously on DAPT, stopped 2024 d/t GIB).   Acute post-op blood loss anemia Iron  deficiency anemia Received IV ferricit 250 mg BID x 2 days   Urinary retention Constipation Foley placed and removed 2/15. Has had issues with urinary retention in the past as well. Re-started Flomax .  Post renal AKI, resolved Cr increased to 1.6 due to urinary retention now back to baseline.    H/o Atrial Flutter Patient is amenable to Eliquis  if indicated (reports self-discontinuation). However upon further review appears he had flutter in 2023 and was  cardioverted. Do not have any evidence of recurrence. Could be worth re-connecting with cardiology and having monitor to determine need for anticoagulation.    HFpEF HTN HLD  Evidence of volume overload on exam. Lisinopril  held d/t soft BP.   Osteoarthritis lower back Chronic back pain Xrays reveal multilevel degenerative changes most significant at L1-L2 and L5-S1. Xray of lower spine significant for anterior wedging with inferior endplate cortical irregularity involving the T12 vertebral body, which may be degenerative, however does not clinically correlate with point tenderness. No fracture/dislocation of sacrum/coccyx.   Depression Denies suicidal thoughts Reports loneliness and depression, which affects his motivation to take his insulin  and other medications. Continue home medication Cymbalta  30 mg twice daily. Referral to mental health placed.   Concern for diabetic retinopathy B/L cataracts Patient reports not able to see in the right eye and left eye is blurry, was planning to have procedure 6 months ago. 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 Ophthalmology consulted 2/2, plan for outpatient cataract replacements.     Surgeries and Procedures Performed:   Procedure(s): AMPUTATION, FOREARM, THROUGH RADIUS AND ULNA NERVE PEDICLE TRANSFER; ARM; FIRST STAGE GRAFTING OF AUTOLOGOUS SOFT TISSUE, OTHER, HARVESTED BY DIRECT EXCISION (EG, FAT, DERMIS, FASCIA) IMPLANTATION OF NERVE END INTO BONE OR MUSCLE (LIST IN ADDITION TO NEUROMA EXCISION) _____________________  Discharge Exam:  BP 109/49 (BP Location: Right upper arm, Patient Position: Lying)   Pulse 75   Temp 36.9 C (98.4 F) (Oral)   Resp 16   Ht 180.3 cm (5' 11)   Wt (!) 152.5 kg (336 lb 3.2 oz)   SpO2 100%   BMI 46.89 kg/m  O2 Device: Nasal cannula (04/14/23 0837)  Gen: in NAD, sitting up side of bed HEENT: pupils equal, no scleral icterus CV: RRR, no murmurs/rubs/gallops, no LE edema bilaterally Pulm: CTAB, no wheezes/rales/rhonchi, normal respiratory effort ABD: protuberant, soft, non-tender Skin: warm and dry, L leg with hyperpigmentation/venous stasis changes Ext: RLE amputation, left arm stump with erythema and warmth but improved from yesterday, incision without purulence  Pertinent Lab Testing: Recent Labs  Lab 04/16/23 0536 04/22/23 0334  NA 134* 133*  K 4.7 4.3  CL 96* 94*  CO2 27 26  BUN 22* 29*  CREATININE 1.1 1.2  GLUCOSE 246* 213*  CALCIUM  8.5* 9.0   No results for input(s): AST, ALT, ALKPHOS, TBILI in the last 168 hours.   Recent Labs  Lab 04/21/23 1117  WBC 5.3  HGB 9.1*  HCT 30.9*  PLT 232   No results for input(s): APTT, INR in the last 168 hours.   Other Pertinent Labs:  None  Micro:  Lab Results  Component Value Date   TISSUEC (!) 04/03/2023    Rare Growth Methicillin Resistant Staphylococcus aureus (MRSA)   TISSUEC (!) 04/03/2023    Rare Growth Staphylococcus epidermidis (coagulase negative)   TISSUEC (!) 04/03/2023    2+ Mixed anaerobic Gram positive and Gram negative organisms      Pertinent Imaging:   Results for orders placed during the hospital encounter of 04/02/23  CT  ABDOMEN ANGIOGRAM WITH RUNOFF WITH AND WITHOUT CONTRAST  Narrative Procedure: CT ABDOMEN ANGIOGRAM WITH RUNOFF WITH AND WITHOUT CONTRAST  Indication:  Peripheral arterial disease (PAD), asymptomatic, with runoff to eval left leg, I73.9 Peripheral vascular disease, unspecified (CMS-HCC). Evaluation for extent of vascular disease is requested.  Comparison(s): None  Technique:  Volumetric 0.625 mm axial images from the diaphragm through the bilateral lower extremities were performed following IV administration of iodinated contrast  without adverse reaction. If IV contrast material had not been administered, the likelihood of detecting abnormalities relevant to the patients condition would have been substantially decreased. Likewise, the examination was interpreted using multiplanar and 3D reconstructions for assessment of vascular anatomy.   Systemic Vascular Findings:  Aortoiliac System:  The abdominal aorta is of normal caliber without evidence of aneurysm or dissection.  There are no appreciable aortic atherosclerotic calcifications or evidence of stenosis.  The bilateral common iliac, external iliac, and internal iliac arteries are patent without evidence of plaque or stenosis.  Visceral Arteries:  The celiac axis, superior mesenteric artery and inferior mesenteric arteries are patent without evidence of plaque or stenosis.  Renal Arteries:  There are single bilateral renal arteries which are patent without evidence of plaque or stenosis.  Right Lower Extremity:  Status post below-knee amputation. The common femoral, superficial femoral, profunda femoris and popliteal arteries are patent without evidence of any plaque or stenosis.  Left Lower Extremity:  The common femoral, superficial femoral, profunda femoris and popliteal arteries are patent without evidence of any significant stenosis.  There is three vessel runoff to the foot.   CTA Abdomen and Pelvis  Findings:  Tiny hiatal hernia. The liver, gall bladder, biliary ductal system, pancreas, spleen, adrenal glands, kidneys, collecting system, stomach, small bowel, and colon are normal in appearance. The prostate is normal in size. No lymphadenopathy, free peritoneal air, or free fluid. Moderate degenerative changes of the lumbar spine without suspicious lytic or sclerotic osseous lesion. Nonspecific subcutaneous soft tissue stranding within the right upper abdominal soft tissues measuring approximately 6.2 x 2.2 cm (series 7; image 174).  Nonspecific subcutaneous fat stranding and skin thickening at the right lower extremity amputation site. 2.3 x 2.2 cm enhancing focus within the stump soft tissues may reflect the sequela of a hematoma. Additional mild irregularity/sclerosis of the tibial stump could reflect callus formation versus sequela of prior osteomyelitis. No evidence of erosion or lucency to suggest active osteomyelitis.  Impression:  1.  No appreciable atherosclerotic disease. Three-vessel runoff to the left foot. 2.  Status post right below-knee amputation with patent remaining arterial vasculature. There is mild soft tissue thickening and stranding at the amputation stump, which is nonspecific, though can be seen in the setting of soft tissue infection in the appropriate clinical setting; correlate with physical exam. 3.  Nonspecific subcutaneous soft tissue stranding within the right upper abdominal soft tissues; correlate with physical exam and possible injection site location.  The preliminary report (critical or emergent communication) was reviewed prior to this dictation and there are no critical differences between the preliminary results and the impressions in this final report.  Electronically Reviewed by:  Rumalda Kaplan, MD, [name] Electronically Reviewed on:  04/21/2023 8:52 AM  I have reviewed the images and concur with the above  findings.  Electronically Signed by:  Emeline Ni, MD, [name] Electronically Signed on:  04/21/2023 9:10 AM   Results for orders placed during the hospital encounter of 04/02/23  MRI HAND LEFT WITHOUT CONTRAST  Narrative EXAMINATION: MRI LEFT HAND WITHOUT CONTRAST  INDICATION: Soft tissue infection suspected, hand, xray done, severe R hand infeciton, assess for fluid collection or osteomyeltisis, M79.642 Pain in left hand.  COMPARISON: Left hand radiographs on 04/02/2023  TECHNIQUE: Noncontrast multiplanar, multiecho imaging of the left hand was performed, including T1-weighted and fluid sensitive sequences.  FINDINGS:  Alignment: Anatomic.  Bones: *  Diffuse marrow signal abnormality involving the second through fifth metacarpals and patchy areas throughout  the carpal bones. Cortical irregularity/indistinctness involving the second metacarpal base in correlation with prior radiographs. *  No acute fracture. *  Absent third distal/middle phalanx.  Joint spaces: Mild osteoarthritic changes of base first CMC. Small nonspecific joint effusion about the radiocarpal joint with heterogeneous T2 intensity. No joint effusion distally involving the MCP joints.  Tendons: Extensors: Intact. Nonspecific tenosynovitis of the fourth compartment. Flexors: Severe tenosynovitis of the flexor pollicis longus FDP/FDS of the index and middle fingers most significant involving the index finger extending distally to the level of the MCP joints with diminutive tendinous appearance. Suspected discontinuity of the second flexor tendons at the level of the MCP/proximal phalanx.  Muscles: Severe diffuse intramuscular edema.  Other soft tissues: Soft tissue defect along the volar wrist with fluid extending to communicate the carpal tunnel. Associated hypointensity within the carpal tunnel concerning for gas. Additional skin defect within the base of the thenar eminence with  intramuscular fluid filled sinus tract extending towards the traversing first and second flexor tendon sheaths, possible phlegmon.  Impression 1.  Multifocal soft tissue/skin defects along the volar wrist and hand with sinus tracts/phlegmons which respectively communicate with the carpal tunnel and the first and second flexor tendon sheaths. Subcutaneous emphysema may also be present. 2.  Toxic tenosynovitis of the flexor pollicis longus and second/third FDS/FDP with additional soft tissue findings concerning for infectious myositis/cellulitis. 3.  Diffuse marrow edema the second through fifth metacarpals and involving the carpal bones with cortical indistinctness of the metacarpal base of the index finger; highly concerning for evolving multifocal osteomyelitis. 4.  Small radiocarpal joint effusion/synovitis.  Electronically Reviewed by:  Bernardino Saucer, DO, [name] Electronically Reviewed on:  04/03/2023 11:00 AM  I have reviewed the images and concur with the above findings.  Electronically Signed by:  Lamar Chang, MD, [name] Electronically Signed on:  04/03/2023 11:41 AM   Lower Extremity Ankle to Brachial Indices 2/19   Doppler Signals:   Left  Posterior Tibial triphasic  Dorsalis Pedis triphasic    Segmental Limb Pressures:   Right Left  Brachial 145 N/a  Ankle (PT) Hx of amputation 186 (1.28)  Ankle (DP) Hx of amputation 180 (1.24)  Digit Hx of amputation 23 (0.16)      Impression:  Left ankle brachial index (ABI) of 1.28 is consistent with falsely elevated due to vessel calcification arterial occlusive disease at rest. Continuous wave Doppler waveforms at the ankle of the posterior tibial artery were triphasic and triphasic at the dorsalis pedis artery. Digit pressure is consistent with unlikely healing. Elevated pressures due to medial wall calcifications cannot be excluded by continuous wave Doppler   _____________________  Code Status: Full  Code Goals of care were not addressed during this admission.   Status on Discharge:  Current activity: Walks occasionally (04/21/23 2100) Current mobility: Slightly limited (04/21/23 2100)  Activity Recommendation: NWB LUE  Other Discharge Instructions: Services setup at discharge: SNF PT/OT Tubes/lines at discharge: None  Diet: Diet carbohydrate level 2 (60 gm/meal); NO MILK TO DRINK  Wound Care Order Instructions             Wound Care  As directed       Comments: Please keep Tubigrip Size D (SAP #[id number]) or gray shrinker with ring in place to left arm. Apply in double layer for compression.  Remove daily during dressing change.      Wound Care  Every Shift       Question Answer Comment  Body Site LEFT UPPER  EXTREMITY   Instructions Gently clean off all peri-incisional/wound crusting with gauze soaked with half-diluted peroxide. Incisions should get a thin layer of vaseline (keeping them greasy at all times).  Cover with dry gauze and kerlix wrap.  Change daily.                 _____________________  Time spent on discharge process: 35 minutes    HOLMES ARNETT SOUR, MD [facility]  04/22/2023   Hospital Contact Information:  [name]) [name]) [name])  Pending tests:  Laboratory: [phone] Microbiology: [phone] Pathology: [phone] Radiology: [phone]  General questions: [phone] Pending tests: Laboratory: [phone] Microbiology: [phone] Pathology: [phone] Radiology: [phone]  General questions:  [phone] Pending tests:  Laboratory: [phone] Microbiology: [phone] Pathology: [phone] Radiology: [phone]  General questions:  [phone]

## 2023-04-15 NOTE — Progress Notes (Addendum)
 Nutrition Assessment  Reason for visit: Follow-up  Admission Diagnosis / Current Problem: Russell [name] is a 58 y.o. male who presents for wound check for left hand.    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    Nutrition History:   Food Allergies: Shellfish containing product - unknown reaction   Intake: Reports eating junk food.  Greatly enjoys sweets.    Current Nutrition: Diet Order: Diet carbohydrate level 2 (60 gm/meal)  Intake: Primarily completing >75% meals per pt report and flowsheet documentation.  Sometimes has difficulty placing meal orders d/t visual impairment and will receive standard tray.  Does not always like food provided on standard tray.  Able to place meal orders today.    Date/Time Percentage of meal eaten Meal  04/15/23 0823 76-100% Breakfast  04/14/23 1700 76-100% Dinner  04/14/23 1143 1-24% Lunch  04/14/23 0950 1-24% Snack/Other  04/14/23 0837 0% (None) Breakfast  04/13/23 1600 76-100% Dinner  04/13/23 1200 76-100% Lunch  04/13/23 0903 76-100% Breakfast  04/12/23 0830 1-24% Breakfast  04/10/23 1747 25-50% Dinner  04/10/23 1154 76-100% Lunch  04/10/23 0900 76-100% Breakfast  04/09/23 2100 76-100% Dinner  04/09/23 1200 76-100% Lunch  04/09/23 0800 76-100% Breakfast  04/09/23 0000 51-75%  Snack/Other  04/08/23 1724 76-100% Dinner  04/08/23 1100 76-100% Lunch  04/08/23 0700 76-100%    Nutrition-Focused Findings: Physical  Assessment: no muscle or fat losses observed (04/15/23 1257)  GI: Nausea; Last BM: 04/15/23 Skin: Incision (left lower arm, amputation)  Edema: Generalized Trace, BUE Trace, RLE Trace, LLE +1   Biochemical Data and Medication (nutritionally relevant): Labs:  Recent Labs  Lab 04/09/23 0316 04/10/23 1323 04/13/23 0542 04/14/23 0617 04/15/23 0448  NA 135   < > 134* 136 136  K 4.4   < > 3.9 3.9 4.3  CL 101   < > 95* 98 96*  CO2 27   < > 26 30 32*  BUN 16   < > 16 17 22*  CREATININE 1.1   < > 1.4* 1.2 1.6*  GLUCOSE 121   < > 135 169* 206*  CALCIUM  8.3*   < > 8.7 8.7 8.7  PHOS 3.7  --   --   --   --    < > = values in this interval not displayed.   Recent Labs    04/14/23 1640 04/14/23 2006 04/15/23 0629 04/15/23 0757 04/15/23 1214  POCGLU 195* 206* 229* 198* 318*   A1C 11.9 (04/01/22)  Medications: Furosemide  80 mg BID, Insulin  + CDI, Senokot BID, Zofran  PRN   Estimated Nutritional Needs: Calculation Weight Used: Admission weight (152.5 kg) Energy: 2287 -2440 kcal (15-16 kcal/kg) Protein: 97-121 g (1.2-1.5 g/kg) Fluid: 32ml/kcal or per team  Nutrition Evaluation:  Evaluated for nutrition follow-up.  Reports having a fair appetite and completing >75% meals during admission.  Reports eating more nutritious, balanced meals during admission compared to PTA.  Suspect patient is meeting estimated nutritional needs at this time.  Nutrition Diagnosis: -NB-1.6 Limited adherence to nutrition-related recommendations related to T2DM as evidenced by elevated A1C  Malnutrition Assessment: Diagnosis: insufficient evidence available to diagnose malnutrition (04/08/23 1500)   Criteria:  n/a Intervention:  N/a  Nutrition Interventions / Recommendations:  Continue carb level 2 diet Encourage patient to ask for assistance placing meal orders if needed.  Bowel regimen for regularity  Please obtain a new weight when able.     Assessed at: 1230  Time Spent: 30 Minute(s)    Heather Ferrier, MHA, RD, LDN

## 2023-04-22 NOTE — Progress Notes (Signed)
 Nutrition Assessment  Reason for visit: Follow-up  Admission Diagnosis / Current Problem: Russell [name] is a 58 y.o. male who presents for wound check for left hand.    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    Nutrition History:   Food Allergies: Shellfish containing product - unknown reaction   Intake: Reports eating junk food.  Greatly enjoys sweets.    Current Nutrition: Diet Order: Diet carbohydrate level 2 (60 gm/meal); NO MILK TO DRINK  Intake: Patient complaining re: Carb level 2 restrictions. He wants to order and eat what he wants  rition-Focused Findings: Physical Assessment: no muscle or fat losses observed (04/22/23 1200) GI function:  LBM (S) 04/22/23 Stool appearance Unable to visualize/flushed (04/22/23 0900)  Skin: Incision (left lower arm, amputation)  Edema: none documented   Biochemical Data and Medication (nutritionally relevant): Labs:  Recent Labs  Lab 04/16/23 0536 04/22/23 0334  NA 134* 133*  K 4.7 4.3  CL 96* 94*  CO2 27 26  BUN 22* 29*  CREATININE 1.1 1.2  GLUCOSE 246* 213*  CALCIUM  8.5* 9.0   Recent Labs    04/21/23 1637 04/21/23 2022 04/22/23 0622 04/22/23 0925 04/22/23 1219  POCGLU 198* 168* 241* 275* 192*   A1C 11.9 (04/01/22)  Medications:  . FUROsemide   80 mg Oral BID  . insulin  GLARGINE-yfgn (SEMGLEE ) injection  38 Units Subcutaneous QHS  . insulin  LISPRO (AdmeLOG, HumaLOG) injection  10  Units Subcutaneous TID CC  . insulin  LISPRO (AdmeLOG, HumaLOG) injection  0-12 Units Subcutaneous TID CC  . polyethylene glycol  17 g Oral BID  . sennosides-docusate  2 tablet Oral BID     Estimated Nutritional Needs: Calculation Weight Used: Admission weight (152.5 kg) Energy: 2287 -2440 kcal (15-16 kcal/kg) Protein: 97-121 g (1.2-1.5 g/kg) Fluid: 88ml/kcal or per team  Nutrition Evaluation:  Evaluated for nutrition follow-up.  Reports not eating as much because he's not getting what he wants.   Nutrition Diagnosis: -NB-1.6 Limited adherence to nutrition-related recommendations related to T2DM as evidenced by elevated A1C and patient not receptive to nutrition education on diabetes/carb control  Malnutrition Assessment: Diagnosis: insufficient evidence available to diagnose malnutrition (04/08/23 1500)   Criteria:  n/a Intervention:  N/a  Nutrition Interventions / Recommendations:  Continue carb level 2 diet Encourage patient to ask for assistance placing meal orders if needed.  Bowel regimen for regularity  Please obtain a new weight when able.     Assessed at: 1235  Time Spent: 35 Minute(s)   Josette Ellen RD/LDN/CNSC

## 2023-04-22 NOTE — Progress Notes (Signed)
 Russell [name], Upper Extremity, & Microvascular Surgery  PROGRESS NOTE   Date of Service: 04/22/2023  Admit Date: 04/02/2023 [facility] Day: 11);  Code Status: Full Code  BRIEF HPI: 58 y.o. male 70 Days Post-Op 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   SUBJECTIVE   NAE overnight AF, HDS pain well controlled cellulitic changes on distal stump improving on Abx (WBC yesterday 5)   OBJECTIVE   VITALS Temp:  [36.4 C (97.6 F)-36.8 C (98.2 F)] 36.8 C (98.2 F) Heart Rate:  [66-72] 72 Resp:  [16-19] 16 BP: (118-138)/(59-74) 118/74  FOCUSED EXAM GEN: NAD CV: hemodynamically stable RESP: breathing unlabored LUE: incisions intact. Cellulitic changes along the distal stump; improvement compared to yesterday no expressible fluid from incisions, no balottable collections  LABS: Labs in the chart have been reviewed.   PHOTOS (if taken today):   IMPRESSION/PLAN   17 Days Post-Op s/p left transradial amputation Cellulitis improving on Abx  -recommend maintaining antibiotics for 7 to 10 days given his history -strict elevation of LEFT upper extremity at all times in blue foam pillow  (SAP#:[id number] - SPAN Boot Foam Cradle; HUC order has been placed to obtain as soon as able).  -please hold off on stump shrinker for now; ok to resume Mon 2/24 -wound care (each shift): Gently clean off all peri-incisional/wound crusting with gauze soaked with half-diluted peroxide. Incisions should get a thin layer of vaseline (keeping them greasy at all times) -sutures planned for removal 04/26/23 -rest per primary -Follow up schedule in [facility] on 05/05/23.   Thank you for the opportunity to be involved in this patient's care.  The clinical plan was prepared by the plastic surgery resident, pending approval by the attending surgeon, Dr. Saltzman. Please page us  at [phone] with further questions or concerns.   DANIEL JINNIE PEPPERS, MD [name]      ------------------------------------------------------------------------------- Attestation signed by Saltzman, Eliana Brailovsky, MD at 04/22/2023  2:15 PM Attestation Statement:   This service was rendered under my overall direction and control, and I was immediately available via phone/pager or present on site.  MARYLU KARYN MELINE, MD  -------------------------------------------------------------------------------

## 2023-09-06 ENCOUNTER — Emergency Department

## 2023-09-06 ENCOUNTER — Inpatient Hospital Stay
Admission: EM | Admit: 2023-09-06 | Discharge: 2023-09-13 | DRG: 854 | Disposition: A | Discharge status: Home / Self Care | Attending: Internal Medicine | Admitting: Internal Medicine

## 2023-09-06 ENCOUNTER — Inpatient Hospital Stay

## 2023-09-06 ENCOUNTER — Other Ambulatory Visit: Payer: Self-pay

## 2023-09-06 DIAGNOSIS — M86172 Other acute osteomyelitis, left ankle and foot: Principal | ICD-10-CM | POA: Diagnosis present

## 2023-09-06 DIAGNOSIS — E11628 Type 2 diabetes mellitus with other skin complications: Secondary | ICD-10-CM | POA: Diagnosis present

## 2023-09-06 DIAGNOSIS — A419 Sepsis, unspecified organism: Principal | ICD-10-CM | POA: Diagnosis present

## 2023-09-06 DIAGNOSIS — Z91011 Allergy to milk products: Secondary | ICD-10-CM

## 2023-09-06 DIAGNOSIS — E1165 Type 2 diabetes mellitus with hyperglycemia: Secondary | ICD-10-CM | POA: Diagnosis present

## 2023-09-06 DIAGNOSIS — E785 Hyperlipidemia, unspecified: Secondary | ICD-10-CM | POA: Diagnosis present

## 2023-09-06 DIAGNOSIS — I5032 Chronic diastolic (congestive) heart failure: Secondary | ICD-10-CM | POA: Diagnosis present

## 2023-09-06 DIAGNOSIS — L03116 Cellulitis of left lower limb: Secondary | ICD-10-CM | POA: Diagnosis present

## 2023-09-06 DIAGNOSIS — I739 Peripheral vascular disease, unspecified: Secondary | ICD-10-CM | POA: Diagnosis present

## 2023-09-06 DIAGNOSIS — Z91148 Patient's other noncompliance with medication regimen for other reason: Secondary | ICD-10-CM

## 2023-09-06 DIAGNOSIS — R710 Precipitous drop in hematocrit: Secondary | ICD-10-CM | POA: Diagnosis not present

## 2023-09-06 DIAGNOSIS — E11621 Type 2 diabetes mellitus with foot ulcer: Secondary | ICD-10-CM | POA: Diagnosis present

## 2023-09-06 DIAGNOSIS — F32A Depression, unspecified: Secondary | ICD-10-CM | POA: Diagnosis present

## 2023-09-06 DIAGNOSIS — Z881 Allergy status to other antibiotic agents status: Secondary | ICD-10-CM

## 2023-09-06 DIAGNOSIS — F419 Anxiety disorder, unspecified: Secondary | ICD-10-CM | POA: Diagnosis present

## 2023-09-06 DIAGNOSIS — G546 Phantom limb syndrome with pain: Secondary | ICD-10-CM | POA: Diagnosis not present

## 2023-09-06 DIAGNOSIS — Z825 Family history of asthma and other chronic lower respiratory diseases: Secondary | ICD-10-CM

## 2023-09-06 DIAGNOSIS — E1152 Type 2 diabetes mellitus with diabetic peripheral angiopathy with gangrene: Secondary | ICD-10-CM | POA: Diagnosis present

## 2023-09-06 DIAGNOSIS — Z89612 Acquired absence of left leg above knee: Secondary | ICD-10-CM

## 2023-09-06 DIAGNOSIS — Z89202 Acquired absence of left upper limb, unspecified level: Secondary | ICD-10-CM | POA: Diagnosis not present

## 2023-09-06 DIAGNOSIS — Z8051 Family history of malignant neoplasm of kidney: Secondary | ICD-10-CM

## 2023-09-06 DIAGNOSIS — Z6841 Body Mass Index (BMI) 40.0 and over, adult: Secondary | ICD-10-CM

## 2023-09-06 DIAGNOSIS — Z79899 Other long term (current) drug therapy: Secondary | ICD-10-CM

## 2023-09-06 DIAGNOSIS — M84478A Pathological fracture, left toe(s), initial encounter for fracture: Secondary | ICD-10-CM | POA: Diagnosis present

## 2023-09-06 DIAGNOSIS — I4892 Unspecified atrial flutter: Secondary | ICD-10-CM | POA: Diagnosis present

## 2023-09-06 DIAGNOSIS — Z91118 Patient's noncompliance with dietary regimen for other reason: Secondary | ICD-10-CM

## 2023-09-06 DIAGNOSIS — Z794 Long term (current) use of insulin: Secondary | ICD-10-CM

## 2023-09-06 DIAGNOSIS — E662 Morbid (severe) obesity with alveolar hypoventilation: Secondary | ICD-10-CM | POA: Diagnosis present

## 2023-09-06 DIAGNOSIS — J9611 Chronic respiratory failure with hypoxia: Secondary | ICD-10-CM | POA: Diagnosis present

## 2023-09-06 DIAGNOSIS — Z9889 Other specified postprocedural states: Secondary | ICD-10-CM | POA: Diagnosis not present

## 2023-09-06 DIAGNOSIS — M869 Osteomyelitis, unspecified: Secondary | ICD-10-CM | POA: Diagnosis present

## 2023-09-06 DIAGNOSIS — Z8614 Personal history of Methicillin resistant Staphylococcus aureus infection: Secondary | ICD-10-CM

## 2023-09-06 DIAGNOSIS — G4733 Obstructive sleep apnea (adult) (pediatric): Secondary | ICD-10-CM | POA: Diagnosis present

## 2023-09-06 DIAGNOSIS — Z882 Allergy status to sulfonamides status: Secondary | ICD-10-CM

## 2023-09-06 DIAGNOSIS — I10 Essential (primary) hypertension: Secondary | ICD-10-CM | POA: Diagnosis present

## 2023-09-06 DIAGNOSIS — E1142 Type 2 diabetes mellitus with diabetic polyneuropathy: Secondary | ICD-10-CM | POA: Diagnosis present

## 2023-09-06 DIAGNOSIS — T383X6A Underdosing of insulin and oral hypoglycemic [antidiabetic] drugs, initial encounter: Secondary | ICD-10-CM | POA: Diagnosis present

## 2023-09-06 DIAGNOSIS — L89159 Pressure ulcer of sacral region, unspecified stage: Secondary | ICD-10-CM | POA: Diagnosis present

## 2023-09-06 DIAGNOSIS — R739 Hyperglycemia, unspecified: Secondary | ICD-10-CM | POA: Diagnosis present

## 2023-09-06 DIAGNOSIS — Z7982 Long term (current) use of aspirin: Secondary | ICD-10-CM

## 2023-09-06 DIAGNOSIS — Z993 Dependence on wheelchair: Secondary | ICD-10-CM

## 2023-09-06 DIAGNOSIS — Z833 Family history of diabetes mellitus: Secondary | ICD-10-CM

## 2023-09-06 DIAGNOSIS — I110 Hypertensive heart disease with heart failure: Secondary | ICD-10-CM | POA: Diagnosis present

## 2023-09-06 DIAGNOSIS — Z888 Allergy status to other drugs, medicaments and biological substances status: Secondary | ICD-10-CM

## 2023-09-06 DIAGNOSIS — Z89511 Acquired absence of right leg below knee: Secondary | ICD-10-CM

## 2023-09-06 DIAGNOSIS — E1169 Type 2 diabetes mellitus with other specified complication: Secondary | ICD-10-CM | POA: Diagnosis present

## 2023-09-06 DIAGNOSIS — G894 Chronic pain syndrome: Secondary | ICD-10-CM | POA: Diagnosis present

## 2023-09-06 DIAGNOSIS — Z8249 Family history of ischemic heart disease and other diseases of the circulatory system: Secondary | ICD-10-CM

## 2023-09-06 DIAGNOSIS — Z635 Disruption of family by separation and divorce: Secondary | ICD-10-CM

## 2023-09-06 DIAGNOSIS — I87302 Chronic venous hypertension (idiopathic) without complications of left lower extremity: Secondary | ICD-10-CM | POA: Diagnosis not present

## 2023-09-06 DIAGNOSIS — Z91013 Allergy to seafood: Secondary | ICD-10-CM

## 2023-09-06 DIAGNOSIS — E0800 Diabetes mellitus due to underlying condition with hyperosmolarity without nonketotic hyperglycemic-hyperosmolar coma (NKHHC): Secondary | ICD-10-CM

## 2023-09-06 DIAGNOSIS — E11622 Type 2 diabetes mellitus with other skin ulcer: Secondary | ICD-10-CM

## 2023-09-06 DIAGNOSIS — L97529 Non-pressure chronic ulcer of other part of left foot with unspecified severity: Secondary | ICD-10-CM | POA: Diagnosis present

## 2023-09-06 LAB — BASIC METABOLIC PANEL WITH GFR
Anion gap: 9 (ref 5–15)
Anion gap: 9 (ref 5–15)
BUN: 24 mg/dL — ABNORMAL HIGH (ref 6–20)
BUN: 26 mg/dL — ABNORMAL HIGH (ref 6–20)
CO2: 23 mmol/L (ref 22–32)
CO2: 23 mmol/L (ref 22–32)
Calcium: 8 mg/dL — ABNORMAL LOW (ref 8.9–10.3)
Calcium: 8.1 mg/dL — ABNORMAL LOW (ref 8.9–10.3)
Chloride: 98 mmol/L (ref 98–111)
Chloride: 97 mmol/L — ABNORMAL LOW (ref 98–111)
Creatinine, Ser: 1.1 mg/dL (ref 0.61–1.24)
Creatinine, Ser: 1 mg/dL (ref 0.61–1.24)
GFR, Estimated: >60 mL/min (ref >60)
GFR, Estimated: >60 mL/min (ref >60)
Glucose, Bld: 506 mg/dL (ref 70–99)
Glucose, Bld: 415 mg/dL — ABNORMAL HIGH (ref 70–99)
Potassium: 4.3 mmol/L (ref 3.5–5.1)
Potassium: 3.9 mmol/L (ref 3.5–5.1)
Sodium: 130 mmol/L — ABNORMAL LOW (ref 135–145)
Sodium: 129 mmol/L — ABNORMAL LOW (ref 135–145)

## 2023-09-06 LAB — PROCALCITONIN: Procalcitonin: 3.4 ng/mL

## 2023-09-06 LAB — CBC WITH DIFFERENTIAL/PLATELET
Abs Immature Granulocytes: 0.11 K/uL — ABNORMAL HIGH (ref 0.00–0.07)
Basophils Absolute: 0 K/uL (ref 0.0–0.1)
Basophils Relative: 0 %
Eosinophils Absolute: 0 K/uL (ref 0.0–0.5)
Eosinophils Relative: 0 %
HCT: 29.8 % — ABNORMAL LOW (ref 39.0–52.0)
Hemoglobin: 9 g/dL — ABNORMAL LOW (ref 13.0–17.0)
Immature Granulocytes: 1 %
Lymphocytes Relative: 5 %
Lymphs Abs: 0.7 K/uL (ref 0.7–4.0)
MCH: 20.9 pg — ABNORMAL LOW (ref 26.0–34.0)
MCHC: 30.2 g/dL (ref 30.0–36.0)
MCV: 69.1 fL — ABNORMAL LOW (ref 80.0–100.0)
Monocytes Absolute: 0.7 K/uL (ref 0.1–1.0)
Monocytes Relative: 5 %
Neutro Abs: 13 K/uL — ABNORMAL HIGH (ref 1.7–7.7)
Neutrophils Relative %: 89 %
Platelets: 260 K/uL (ref 150–400)
RBC: 4.31 MIL/uL (ref 4.22–5.81)
RDW: 16.5 % — ABNORMAL HIGH (ref 11.5–15.5)
WBC: 14.6 K/uL — ABNORMAL HIGH (ref 4.0–10.5)
nRBC: 0 % (ref 0.0–0.2)

## 2023-09-06 LAB — SEDIMENTATION RATE: Sed Rate: 92 mm/h — ABNORMAL HIGH (ref 0–20)

## 2023-09-06 LAB — COMPREHENSIVE METABOLIC PANEL WITH GFR
ALT: 18 U/L (ref 0–44)
AST: 36 U/L (ref 15–41)
Albumin: 2.4 g/dL — ABNORMAL LOW (ref 3.5–5.0)
Alkaline Phosphatase: 112 U/L (ref 38–126)
Anion gap: 8 (ref 5–15)
BUN: 24 mg/dL — ABNORMAL HIGH (ref 6–20)
CO2: 25 mmol/L (ref 22–32)
Calcium: 8.4 mg/dL — ABNORMAL LOW (ref 8.9–10.3)
Chloride: 96 mmol/L — ABNORMAL LOW (ref 98–111)
Creatinine, Ser: 1.12 mg/dL (ref 0.61–1.24)
GFR, Estimated: >60 mL/min (ref >60)
Glucose, Bld: 540 mg/dL (ref 70–99)
Potassium: 4.6 mmol/L (ref 3.5–5.1)
Sodium: 129 mmol/L — ABNORMAL LOW (ref 135–145)
Total Bilirubin: 0.5 mg/dL (ref 0.0–1.2)
Total Protein: 7.5 g/dL (ref 6.5–8.1)

## 2023-09-06 LAB — GLUCOSE, CAPILLARY
Glucose-Capillary: 265 mg/dL — ABNORMAL HIGH (ref 70–99)
Glucose-Capillary: 418 mg/dL — ABNORMAL HIGH (ref 70–99)

## 2023-09-06 LAB — PROTIME-INR
INR: 1.4 — ABNORMAL HIGH (ref 0.8–1.2)
INR: 1.4 — ABNORMAL HIGH (ref 0.8–1.2)
Prothrombin Time: 17.5 s — ABNORMAL HIGH (ref 11.4–15.2)
Prothrombin Time: 18.3 s — ABNORMAL HIGH (ref 11.4–15.2)

## 2023-09-06 LAB — HEMOGLOBIN A1C
Hgb A1c MFr Bld: 10.1 % — ABNORMAL HIGH (ref 4.8–5.6)
Mean Plasma Glucose: 243.17 mg/dL

## 2023-09-06 LAB — OSMOLALITY: Osmolality: 307 mosm/kg — ABNORMAL HIGH (ref 275–295)

## 2023-09-06 LAB — CK: Total CK: 655 U/L — ABNORMAL HIGH (ref 49–397)

## 2023-09-06 LAB — C-REACTIVE PROTEIN: CRP: 23.7 mg/dL — ABNORMAL HIGH (ref <1.0)

## 2023-09-06 LAB — CULTURE, BLOOD (ROUTINE X 2)

## 2023-09-06 LAB — CBG MONITORING, ED: Glucose-Capillary: 480 mg/dL — ABNORMAL HIGH (ref 70–99)

## 2023-09-06 LAB — LACTIC ACID, PLASMA: Lactic Acid, Venous: 1.8 mmol/L (ref 0.5–1.9)

## 2023-09-06 MED ORDER — ONDANSETRON HCL 4 MG PO TABS: 4.0000 mg | ORAL_TABLET | Freq: Four times a day (QID) | ORAL | Status: DC | PRN

## 2023-09-06 MED ORDER — MORPHINE SULFATE (PF) 2 MG/ML IV SOLN
2.0000 mg | INTRAVENOUS | Status: DC | PRN
  Administered 2023-09-06 – 2023-09-07 (×3): 2 mg via INTRAVENOUS
  Filled 2023-09-06 (×3): qty 1

## 2023-09-06 MED ORDER — INSULIN ASPART 100 UNIT/ML IJ SOLN
6.0000 [IU] | Freq: Three times a day (TID) | INTRAMUSCULAR | Status: DC
  Administered 2023-09-06: 6 [IU] via SUBCUTANEOUS
  Filled 2023-09-06: qty 1

## 2023-09-06 MED ORDER — DULOXETINE HCL 30 MG PO CPEP
90.0000 mg | ORAL_CAPSULE | Freq: Every day | ORAL | Status: DC
  Administered 2023-09-07 – 2023-09-13 (×7): 90 mg via ORAL
  Filled 2023-09-06 (×8): qty 3

## 2023-09-06 MED ORDER — OXYCODONE HCL 5 MG PO TABS
5.0000 mg | ORAL_TABLET | ORAL | Status: DC | PRN
  Administered 2023-09-06: 5 mg via ORAL
  Filled 2023-09-06 (×2): qty 1

## 2023-09-06 MED ORDER — INSULIN ASPART 100 UNIT/ML IJ SOLN: 0.0000 [IU] | Freq: Three times a day (TID) | INTRAMUSCULAR | Status: DC

## 2023-09-06 MED ORDER — INSULIN ASPART 100 UNIT/ML IJ SOLN
0.0000 [IU] | INTRAMUSCULAR | Status: DC
  Administered 2023-09-06: 11 [IU] via SUBCUTANEOUS
  Administered 2023-09-07 (×2): 4 [IU] via SUBCUTANEOUS
  Administered 2023-09-07: 15 [IU] via SUBCUTANEOUS
  Administered 2023-09-08: 11 [IU] via SUBCUTANEOUS
  Administered 2023-09-08: 20 [IU] via SUBCUTANEOUS
  Administered 2023-09-08 (×2): 15 [IU] via SUBCUTANEOUS
  Administered 2023-09-08: 7 [IU] via SUBCUTANEOUS
  Administered 2023-09-08: 3 [IU] via SUBCUTANEOUS
  Administered 2023-09-08: 15 [IU] via SUBCUTANEOUS
  Administered 2023-09-09 – 2023-09-10 (×7): 3 [IU] via SUBCUTANEOUS
  Administered 2023-09-11 (×2): 4 [IU] via SUBCUTANEOUS
  Administered 2023-09-11 – 2023-09-13 (×5): 3 [IU] via SUBCUTANEOUS
  Filled 2023-09-06 (×23): qty 1

## 2023-09-06 MED ORDER — PIPERACILLIN-TAZOBACTAM 3.375 G IVPB
3.3750 g | Freq: Three times a day (TID) | INTRAVENOUS | Status: DC
  Administered 2023-09-06 – 2023-09-08 (×6): 3.375 g via INTRAVENOUS
  Filled 2023-09-06 (×7): qty 50

## 2023-09-06 MED ORDER — ONDANSETRON HCL 4 MG/2ML IJ SOLN
4.0000 mg | Freq: Four times a day (QID) | INTRAMUSCULAR | Status: DC | PRN
  Administered 2023-09-07 (×2): 4 mg via INTRAVENOUS
  Filled 2023-09-06 (×2): qty 2

## 2023-09-06 MED ORDER — SODIUM CHLORIDE 0.9 % IV BOLUS
2000.0000 mL | Freq: Once | INTRAVENOUS | Status: AC
  Administered 2023-09-06: 2000 mL via INTRAVENOUS

## 2023-09-06 MED ORDER — GADOBUTROL 1 MMOL/ML IV SOLN
10.0000 mL | Freq: Once | INTRAVENOUS | Status: AC | PRN
  Administered 2023-09-06: 10 mL via INTRAVENOUS

## 2023-09-06 MED ORDER — PANTOPRAZOLE SODIUM 40 MG PO TBEC
40.0000 mg | DELAYED_RELEASE_TABLET | Freq: Two times a day (BID) | ORAL | Status: DC
  Administered 2023-09-06 – 2023-09-13 (×14): 40 mg via ORAL
  Filled 2023-09-06 (×14): qty 1

## 2023-09-06 MED ORDER — PIPERACILLIN-TAZOBACTAM 3.375 G IVPB 30 MIN
3.3750 g | Freq: Once | INTRAVENOUS | Status: AC
  Administered 2023-09-06: 3.375 g via INTRAVENOUS
  Filled 2023-09-06: qty 50

## 2023-09-06 MED ORDER — INSULIN ASPART 100 UNIT/ML IJ SOLN
22.0000 [IU] | Freq: Once | INTRAMUSCULAR | Status: AC
  Administered 2023-09-06: 22 [IU] via SUBCUTANEOUS
  Filled 2023-09-06: qty 1

## 2023-09-06 MED ORDER — CHLORHEXIDINE GLUCONATE CLOTH 2 % EX PADS
6.0000 | MEDICATED_PAD | Freq: Every day | CUTANEOUS | Status: DC
  Administered 2023-09-07 – 2023-09-13 (×6): 6 via TOPICAL

## 2023-09-06 MED ORDER — TAMSULOSIN HCL 0.4 MG PO CAPS
0.4000 mg | ORAL_CAPSULE | Freq: Every day | ORAL | Status: DC
  Administered 2023-09-06 – 2023-09-12 (×7): 0.4 mg via ORAL
  Filled 2023-09-06 (×7): qty 1

## 2023-09-06 MED ORDER — INSULIN ASPART 100 UNIT/ML IJ SOLN: 0.0000 [IU] | Freq: Every day | INTRAMUSCULAR | Status: DC

## 2023-09-06 MED ORDER — LINEZOLID 600 MG/300ML IV SOLN
600.0000 mg | Freq: Two times a day (BID) | INTRAVENOUS | Status: DC
  Administered 2023-09-06 – 2023-09-08 (×5): 600 mg via INTRAVENOUS
  Filled 2023-09-06 (×5): qty 300

## 2023-09-06 MED ORDER — INSULIN GLARGINE-YFGN 100 UNIT/ML ~~LOC~~ SOLN
10.0000 [IU] | Freq: Every day | SUBCUTANEOUS | Status: DC
Start: 2023-09-06 — End: 2023-09-08
  Administered 2023-09-06 – 2023-09-07 (×2): 10 [IU] via SUBCUTANEOUS
  Filled 2023-09-06 (×2): qty 0.1

## 2023-09-06 MED ORDER — MELATONIN 5 MG PO TABS
5.0000 mg | ORAL_TABLET | Freq: Every evening | ORAL | Status: DC | PRN
  Administered 2023-09-06 – 2023-09-12 (×5): 5 mg via ORAL
  Filled 2023-09-06 (×5): qty 1

## 2023-09-06 MED ORDER — ENOXAPARIN SODIUM 80 MG/0.8ML IJ SOSY
70.0000 mg | PREFILLED_SYRINGE | INTRAMUSCULAR | Status: DC
  Administered 2023-09-06 – 2023-09-12 (×7): 70 mg via SUBCUTANEOUS
  Filled 2023-09-06 (×5): qty 0.8
  Filled 2023-09-06: qty 0.7
  Filled 2023-09-06: qty 0.8

## 2023-09-06 MED ORDER — INSULIN ASPART 100 UNIT/ML IJ SOLN
20.0000 [IU] | Freq: Once | INTRAMUSCULAR | Status: AC
  Administered 2023-09-06: 20 [IU] via SUBCUTANEOUS
  Filled 2023-09-06: qty 1

## 2023-09-06 MED ORDER — HYDROMORPHONE HCL 1 MG/ML IJ SOLN
2.0000 mg | Freq: Once | INTRAMUSCULAR | Status: AC
  Administered 2023-09-06: 2 mg via INTRAVENOUS
  Filled 2023-09-06: qty 2

## 2023-09-06 MED ORDER — PREGABALIN 75 MG PO CAPS
150.0000 mg | ORAL_CAPSULE | Freq: Three times a day (TID) | ORAL | Status: DC
  Administered 2023-09-06 – 2023-09-13 (×19): 150 mg via ORAL
  Filled 2023-09-06 (×19): qty 2

## 2023-09-06 MED ORDER — ACETAMINOPHEN 325 MG PO TABS
650.0000 mg | ORAL_TABLET | Freq: Four times a day (QID) | ORAL | Status: DC | PRN
  Filled 2023-09-06: qty 2

## 2023-09-06 MED ORDER — ACETAMINOPHEN 650 MG RE SUPP: 650.0000 mg | Freq: Four times a day (QID) | RECTAL | Status: DC | PRN

## 2023-09-06 NOTE — Hospital Course (Signed)
 Patient with PMH of decubitus ulcer of sacrum and left foot ulcer, left hand osteomyelitis S/P transradial amputation in 2/25, right BKA, PAD, history of GI bleed, history of a flutter, HTN, morbid obesity, OSA not on CPAP, depression, uncontrolled type II DM with neuropathy,chronic HFpEF, chronic back pain, presented to the hospital with foul-smelling wound infection on his left foot. 5/25 was seen at [facility] ER for left foot ulcer.  Was prescribed pain medication.  Was referred to podiatry.  Also had difficulty with urination and was referred to urology. Since then the patient has been doing dressing changes.  Has not seen podiatry or urology.  At [facility] health. Presents with concern that the 1 ulcer that he had in his foot is now turning into multiple ulcers and they are getting worse.  Denies any fever or chills.  No nausea no vomiting.  No fall no trauma no injury reported recently. Had a fall 4 months ago since then he reports he has some chronic sacral pain and unable to sit up in the wheelchair and has been laying on his side. Has not seen PCP as well lately. Ran out of his insulin  and is only using [employer] brand short acting insulin . Reports dietary indiscretion with regards to his diabetes and therefore reports he has to occasionally use 80 units of insulin  a day. He reports that he has chronic neuropathy and does not feel any sensation in his legs below the knee.  If I break my leg, I will not feel it. He feels like he has some shortness of breath secondary to swelling. He does not use CPAP at nighttime.  Does not use oxygen at home. Denies any SI.  Assessment and Plan: Sepsis secondary to cellulitis and acute osteomyelitis of the left foot with pathological fracture of fifth metatarsal. Concern for soft tissue gas. Discussed with podiatry Dr. Ashley.  Appreciate consultation. Recommend MRI foot. CK mildly elevated.  655 ESR 92.  CRP pending.  Meeting SIRS criteria with leukocytosis and  tachypnea with evidence of infection in her foot. Most likely will require amputation.  Patient is aware. ABI February 2025 shows 1.28 index.  CT angio February 2025 without any significant atherosclerotic disease with three-vessel runoff to the left foot. Currently on IV Zosyn  as well as IV Zyvox  which I will continue.  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 Superficial cultures performed. 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 Receiving IV fluid.  Given blood pressure stability as well as normal lactic acid level I would avoid continuous IV infusion. Would rather just hold his oral Lasix  at home.  Sacral decubitus ulcer. Wound care consult. Foam dressing.  Uncontrolled type 2 diabetes mellitus with hyperglycemia, with diabetic neuropathy as well as diabetic foot infection. On Lyrica  at home.  Will continue although low threshold to reduce the dose given his sleep apnea. Check hemoglobin A1c. CBG 500.  No anion gap.  Does not with HHS criteria. Will continue with sliding scale insulin , Premeal coverage as well as Lantus  at 10 units at nighttime for now.  Morbid obesity. Body mass index is 44.63 kg/m.  Obesity hypoventilation syndrome. Noncompliant with CPAP. Suspected chronic respiratory failure in the setting of OSA. Placing the patient at high risk for poor outcome. Patient reports that he is losing weight on weight but reports diet indiscretion. Recommend outpatient follow-up with PCP. CPAP nightly.  Oxygen level drops when the patient is sleeping. Oxygen as needed.  HLD. On statin. Currently on hold.  Resume likely  tomorrow after repeat CK level.  PAD. Reportedly was supposed to be on 81 mg aspirin  but currently not taking it. Likely initiate tomorrow.  History of atrial flutter. History of GI bleed. No reoccurrence of atrial flutter per chart review. Had multiple admission with GI bleed therefore not on any  anticoagulation. Monitor.  Depression. Patient is on Cymbalta , Wellbutrin . Currently continuing Cymbalta . Monitor while on Zyvox .  Chronic pain syndrome. Home regimen includes OxyContin  10 mg twice daily as well as oxycodone  as needed. For now we will continue oxycodone  as needed.  Chronic HFpEF. Patient reports that he has some shortness of breath and feels like he is volume overloaded. Unsure about patient's baseline volume status although currently does not appear grossly volume overloaded. May require resumption of Lasix  but currently will hold in the setting of presentation with sepsis.

## 2023-09-06 NOTE — ED Notes (Signed)
 Hospitalist at bedside

## 2023-09-06 NOTE — TOC Initial Note (Signed)
 Transition of Care (TOC) - Initial/Assessment Note  SW met with pt at bedside in ED. Pt reports: Separated from wife but wife is on the lease and visits the home from time to time.  Pt expressed that wife will be taking him home once discharged. PCP: Dr. Bette and uses [facility] as pharmacy. DME: electric wheelchair, rollator, walker.  Pt states that wife and [facility] care coordinator is his supports.  Pt stated that she does not need assistance with SDOH.  Pt stated that [facility] gives bonuses and he uses that to help pay utilities. Receives [phone] from disability.  Pt stated that he does his own ADLs and IADLs but would like assistance.  Pt stated that he uses shorts as underwear because pull ups/ depends smells because he sweats so much.  Pt stated  that he can't drive and that wife provides transportation or he uses public transport.  Pt stated that she would like to rehab at home. Pt stated that he has arthritis and it hard for him to hold things at times. Pt is amputee of left leg and right arm. Pt stated that he has prosthetics.  Pt stated that he will like hospital bed with trapeze bar and shower bench. Pt  stated that he sleeps in his recliner because when he lays flat its hard for him to breath.  Pt stated he use to use CPAP and oxygen.   Pt stated that his rent is $700 and would like to apply for section 8. SW will continue to follow pt while in ED to monitor and address needs and community supports.   Patient Details  Name: Russell [name] MRN: [id number] Date of Birth: Jul 11, 1965  Transition of Care [facility]) CM/SW Contact:    [name], LCSW Phone Number: 09/06/2023, 6:31 PM  Clinical Narrative:                     Barriers to Discharge: Continued Medical Work up   Patient Goals and CMS Choice     Choice offered to / list presented to : Patient      Expected Discharge Plan and Services In-house Referral: Clinical Social Work     Living arrangements for the past 2 months: Single Family  Home                                      Prior Living Arrangements/Services Living arrangements for the past 2 months: Single Family Home Lives with:: Self (states that wife comes from time to time, she is on the lease) Patient language and need for interpreter reviewed:: Yes Do you feel safe going back to the place where you live?: Yes          Current home services: DME (electric chair, rollator, walker) Criminal Activity/Legal Involvement Pertinent to Current Situation/Hospitalization: No - Comment as needed  Activities of Daily Living      Permission Sought/Granted   Permission granted to share information with : Yes, Verbal Permission Granted (wife- Russell [name])              Emotional Assessment Appearance:: Appears stated age Attitude/Demeanor/Rapport: Engaged Affect (typically observed): Appropriate Orientation: : Oriented to Situation, Oriented to  Time, Oriented to Place, Oriented to Self   Psych Involvement: No (comment)  Admission diagnosis:  Osteomyelitis (HCC) [M86.9] Patient Active Problem List   Diagnosis Date Noted   Acute osteomyelitis of metatarsal  bone of left foot (HCC) 09/06/2023   OSA (obstructive sleep apnea) 09/06/2023   Chronic respiratory failure with hypoxia (HCC) 09/06/2023   Sepsis due to cellulitis (HCC) 02/27/2023   PAD (peripheral artery disease) (HCC) 11/10/2022   Generalized weakness 11/09/2022   Ambulatory dysfunction 11/09/2022   Residual hemorrhoidal skin tags 11/06/2022   Acute gastritis 11/06/2022   Major depressive disorder, recurrent episode, moderate (HCC) 10/28/2022   Dyslipidemia 10/24/2022   Anxiety and depression 10/24/2022   Essential hypertension 10/24/2022   Type 2 diabetes mellitus with peripheral neuropathy (HCC) 10/24/2022   Acute gastric ulcer with hemorrhage 10/24/2022   Acute urinary retention 10/17/2022   Acute respiratory failure with hypoxia (HCC) 10/15/2022   Multiple gastric ulcers 10/14/2022    Hyperosmolar hyperglycemic state (HHS) (HCC) 10/13/2022   HLD (hyperlipidemia) 10/13/2022   Acute on chronic diastolic CHF (congestive heart failure) (HCC) 10/13/2022   Depression with anxiety 10/13/2022   Hyperkalemia 10/13/2022   Elevated lactic acid level 10/13/2022   Rectal bleeding 10/13/2022   Acute blood loss anemia 10/13/2022   Wound infection_ left lower leg 10/13/2022   Upper GI bleed 10/13/2022   Hypovolemic shock (HCC) 10/13/2022   Epigastric abdominal pain 10/13/2022   Sciatica of right side 10/13/2022   AKI (acute kidney injury) (HCC) 10/13/2022   Hypotension 11/21/2020   Atrial flutter with rapid ventricular response (HCC) 11/21/2020   Chronic kidney disease, stage 4, severely decreased GFR (HCC) 06/14/2018   Chronic venous insufficiency 06/14/2018   Cellulitis of left lower extremity 02/01/2018   Cellulitis 05/18/2017   Hyperlipidemia, mixed 01/10/2017   Cellulitis and abscess of left leg 08/08/2016   Diabetes with ulcer of leg (HCC) 08/08/2016   Hyperglycemia 08/08/2016   Morbid obesity (HCC) 08/08/2016   Cellulitis and abscess of leg 08/08/2016   Adjustment disorder with depressed mood 11/21/2014   Other social stressor 11/21/2014   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 [place] ([place]) [place] [address] Phone: [phone] Fax: [phone]     Social Drivers of Health (SDOH) Social History: SDOH Screenings   Food Insecurity: No Food Insecurity (04/04/2023)   Received from [facility]  Housing: Low Risk  (04/04/2023)   Received from [facility]  Transportation Needs: No Transportation Needs (04/04/2023)   Received from [facility]  Utilities: Not At Risk (04/04/2023)   Received from [facility]  Financial  Resource Strain: Low Risk  (04/04/2023)   Received from [facility]  Tobacco Use: Low Risk  (09/06/2023)   SDOH Interventions:     Readmission Risk Interventions    03/03/2023   12:27 PM 10/25/2022    3:55 PM  Readmission Risk Prevention Plan  Transportation Screening Complete Complete  PCP or Specialist Appt within 3-5 Days  Complete  HRI or Home Care Consult  Complete  Social Work Consult for Recovery Care Planning/Counseling  Complete  Palliative Care Screening  Not Applicable  Medication Review Oceanographer) Complete Complete  PCP or Specialist appointment within 3-5 days of discharge Complete   HRI or Home Care Consult Complete   Palliative Care Screening Not Applicable   Skilled Nursing Facility Not Applicable

## 2023-09-06 NOTE — H&P (Signed)
 History and Physical  Patient: Russell [name] FMW:[id number] DOB: [birth year] DOA: 09/06/2023 DOS: the patient was seen and examined on 09/06/2023 Patient coming from: Home  Chief Complaint:   HPI: Patient with PMH of decubitus ulcer of sacrum and left foot ulcer, left hand osteomyelitis S/P transradial amputation in 2/25, right BKA, PAD, history of GI bleed, history of a flutter, HTN, morbid obesity, OSA not on CPAP, depression, uncontrolled type II DM with neuropathy,chronic HFpEF, chronic back pain, presented to the hospital with foul-smelling wound infection on his left foot. 5/25 was seen at [facility] ER for left foot ulcer.  Was prescribed pain medication.  Was referred to podiatry.  Also had difficulty with urination and was referred to urology. Since then the patient has been doing dressing changes.  Has not seen podiatry or urology.  At [facility] health. Presents with concern that the 1 ulcer that he had in his foot is now turning into multiple ulcers and they are getting worse.  Denies any fever or chills.  No nausea no vomiting.  No fall no trauma no injury reported recently. Had a fall 4 months ago since then he reports he has some chronic sacral pain and unable to sit up in the wheelchair and has been laying on his side. Has not seen PCP as well lately. Ran out of his insulin  and is only using [employer] brand short acting insulin . Reports dietary indiscretion with regards to his diabetes and therefore reports he has to occasionally use 80 units of insulin  a day. He reports that he has chronic neuropathy and does not feel any sensation in his legs below the knee.  If I break my leg, I will not feel it. He feels like he has some shortness of breath secondary to swelling. He does not use CPAP at nighttime.  Does not use oxygen at home. Denies any SI.  Assessment and Plan: Sepsis secondary to cellulitis and acute osteomyelitis of the left foot with pathological fracture of fifth metatarsal. Concern  for soft tissue gas. Discussed with podiatry Dr. Ashley.  Appreciate consultation. Recommend MRI foot. CK mildly elevated.  655 ESR 92.  CRP pending.  Meeting SIRS criteria with leukocytosis and tachypnea with evidence of infection in her foot. Most likely will require amputation.  Patient is aware. ABI February 2025 shows 1.28 index.  CT angio February 2025 without any significant atherosclerotic disease with three-vessel runoff to the left foot. Currently on IV Zosyn  as well as IV Zyvox  which I will continue.  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 Superficial cultures performed. 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 Receiving IV fluid.  Given blood pressure stability as well as normal lactic acid level I would avoid continuous IV infusion. Would rather just hold his oral Lasix  at home.  Sacral decubitus ulcer. Wound care consult. Foam dressing.  Uncontrolled type 2 diabetes mellitus with hyperglycemia, with diabetic neuropathy as well as diabetic foot infection. On Lyrica  at home.  Will continue although low threshold to reduce the dose given his sleep apnea. Check hemoglobin A1c. CBG 500.  No anion gap.  Does not with HHS criteria. Will continue with sliding scale insulin , Premeal coverage as well as Lantus  at 10 units at nighttime for now.  Morbid obesity. Body mass index is 44.63 kg/m.  Obesity hypoventilation syndrome. Noncompliant with CPAP. Suspected chronic respiratory failure in the setting of OSA. Placing the patient at high risk for poor outcome. Patient reports that he is losing weight on weight but  reports diet indiscretion. Recommend outpatient follow-up with PCP. CPAP nightly.  Oxygen level drops when the patient is sleeping. Oxygen as needed.  HLD. On statin. Currently on hold.  Resume likely tomorrow after repeat CK level.  PAD. Reportedly was supposed to be on 81 mg aspirin  but currently not taking it. Likely initiate tomorrow.  History  of atrial flutter. History of GI bleed. No reoccurrence of atrial flutter per chart review. Had multiple admission with GI bleed therefore not on any anticoagulation. Monitor.  Depression. Patient is on Cymbalta , Wellbutrin . Currently continuing Cymbalta . Monitor while on Zyvox .  Chronic pain syndrome. Home regimen includes OxyContin  10 mg twice daily as well as oxycodone  as needed. For now we will continue oxycodone  as needed.  Chronic HFpEF. Patient reports that he has some shortness of breath and feels like he is volume overloaded. Unsure about patient's baseline volume status although currently does not appear grossly volume overloaded. May require resumption of Lasix  but currently will hold in the setting of presentation with sepsis.  Advance Care Planning:   Code Status: Full Code  Consults: Podiatry  Prior to Admission medications   Medication Sig Start Date End Date Taking? Authorizing Provider  acetaminophen  (TYLENOL ) 325 MG tablet Take 2 tablets (650 mg total) by mouth every 6 (six) hours as needed for mild pain (pain score 1-3) (or Fever >/= 101). 03/02/23   Barbarann Nest, MD  atorvastatin  (LIPITOR) 40 MG tablet Take 40 mg by mouth daily.     [provider]  buPROPion  (WELLBUTRIN  XL) 150 MG 24 hr tablet Take 1 tablet (150 mg total) by mouth daily. 11/06/22 12/06/22  Trudy Anthony HERO, MD  cetirizine (ZYRTEC) 10 MG tablet Take 10 mg by mouth daily as needed for allergies. 06/23/20   [provider]  diphenhydrAMINE  (BENADRYL ) 25 mg capsule Take 1 capsule (25 mg total) by mouth every 6 (six) hours as needed for allergies or itching (30 minutes prior to starting Vancomycin ). 03/02/23   Barbarann Nest, MD  DULoxetine  (CYMBALTA ) 30 MG capsule Take 90 mg by mouth daily.    [provider]  furosemide  (LASIX ) 80 MG tablet Take 80 mg by mouth 2 (two) times daily. 12/08/22   [provider]  HUMULIN  R 100 UNIT/ML injection Inject 0.1 mLs (10 Units  total) into the skin 3 (three) times daily with meals. 11/16/22   Awanda City, MD  insulin  glargine-yfgn (SEMGLEE ) 100 UNIT/ML injection Inject 0.2 mLs (20 Units total) into the skin at bedtime. 03/02/23   Barbarann Nest, MD  Insulin  Pen Needle 33G X 5 MM MISC 1 Dose by Does not apply route 2 (two) times daily. 10/19/22   Josette Ade, MD  melatonin 5 MG TABS Take 1 tablet (5 mg total) by mouth at bedtime. 11/16/22   Awanda City, MD  metoprolol  succinate (TOPROL -XL) 100 MG 24 hr tablet Take 100 mg by mouth daily. 12/08/22   [provider]  oxyCODONE  (ROXICODONE ) 5 MG immediate release tablet Take 1 tablet (5 mg total) by mouth every 8 (eight) hours as needed. 03/02/23 03/01/24  Barbarann Nest, MD  pantoprazole  (PROTONIX ) 40 MG tablet Take 1 tablet (40 mg total) by mouth 2 (two) times daily. 11/08/22   Maddox Bratcher, Sona, MD  polyethylene glycol (MIRALAX  / GLYCOLAX ) 17 g packet Take 17 g by mouth daily as needed. 11/16/22   Awanda City, MD  pregabalin  (LYRICA ) 200 MG capsule Take 1 capsule (200 mg total) by mouth 3 (three) times daily. 11/16/22   Awanda City, MD  senna-docusate (SENOKOT-S) 8.6-50 MG tablet Take 2 tablets by mouth daily as needed for mild constipation.    [provider]    Past Medical History:  Diagnosis Date   Anxiety    CHF (congestive heart failure) (HCC)    EF 30-35%   Chronic diastolic (congestive) heart failure (HCC)    Depression    Diabetes mellitus without complication (HCC)    Hyperlipemia    Hypertension    Morbid obesity (HCC)    Peripheral edema    Past Surgical History:  Procedure Laterality Date   APPENDECTOMY     BELOW KNEE LEG AMPUTATION     Right leg   BIOPSY  10/14/2022   Procedure: BIOPSY;  Surgeon: Aundria, Ladell POUR, MD;  Location: [facility] ENDOSCOPY;  Service: Gastroenterology;;   COLONOSCOPY N/A 10/16/2022   Procedure: COLONOSCOPY;  Surgeon: Therisa Bi, MD;  Location: [facility] ENDOSCOPY;  Service: Gastroenterology;  Laterality: N/A;    ESOPHAGOGASTRODUODENOSCOPY (EGD) WITH PROPOFOL  N/A 10/26/2022   Procedure: ESOPHAGOGASTRODUODENOSCOPY (EGD) WITH PROPOFOL ;  Surgeon: Maryruth Ole DASEN, MD;  Location: [facility] ENDOSCOPY;  Service: Endoscopy;  Laterality: N/A;   ESOPHAGOGASTRODUODENOSCOPY (EGD) WITH PROPOFOL  N/A 10/14/2022   Procedure: ESOPHAGOGASTRODUODENOSCOPY (EGD) WITH PROPOFOL ;  Surgeon: Toledo, Ladell POUR, MD;  Location: [facility] ENDOSCOPY;  Service: Gastroenterology;  Laterality: N/A;   INCISION AND DRAINAGE ABSCESS Left 02/27/2023   Procedure: INCISION AND DRAINAGE ABSCESS LEFT FINGER AND ARM;  Surgeon: Robbin Redell Ned, MD;  Location: [facility] ORS;  Service: Orthopedics;  Laterality: Left;   TONSILLECTOMY     Social History:  reports that he has never smoked. He has never used smokeless tobacco. He reports that he does not drink alcohol and does not use drugs. Allergies  Allergen Reactions   Fluoxetine     Other reaction(s): Hallucination   Shellfish Allergy Hives   Sulfa Antibiotics Hives   Vancomycin      Other reaction(s): Red Man Syndrome   Metformin Nausea Only   Family History  Problem Relation Age of Onset   COPD Mother    Diabetes Father    CAD Father    Kidney cancer Brother    Physical Exam: Vitals:   09/06/23 1017 09/06/23 1100 09/06/23 1130 09/06/23 1200  BP:  115/65 121/61 122/66  Pulse:  76 75   Resp:  13 15 (!) 22  Temp: 98.4 F (36.9 C)     TempSrc: Oral     SpO2:  96% 95%   Weight:      Height:       General: Appear in moderate distress; no visible Abnormal Neck Mass Or lumps, Conjunctiva normal Cardiovascular: S1 and S2 Present, no Murmur, Respiratory: good respiratory effort, Bilateral Air entry present and CTA, no Crackles, no wheezes Abdomen: Bowel Sound present, Non tender  Extremities: Trace pedal edema, redness involving the left leg up to thigh area Neurology: alert and oriented to time, place, and person Gait not checked due to patient safety concerns   Data Reviewed: I have  reviewed ED notes, Vitals, Lab results and outpatient records. Since last encounter, pertinent lab results CBC and BMP   . I have ordered test including CBC, BMP, ESR, CRP  . I have discussed pt's care plan and test results with podiatry  . I have ordered imaging MRI foot  .   Family Communication: No one at bedside  Author: Yetta Blanch, MD 09/06/2023 12:27 PM For on call review [email or web address].

## 2023-09-06 NOTE — Progress Notes (Signed)
 Patient ordered on CPAP at night. Patient declined hospital machine. Patient stated he doesn't need a CPAP, just needs oxygen at night.

## 2023-09-06 NOTE — Consult Note (Signed)
 ORTHOPAEDIC CONSULTATION  REQUESTING PHYSICIAN: Tobie Yetta HERO, MD  Chief Complaint: Left foot ulceration  HPI: Russell [name] is a 58 y.o. male who complains of worsening ulceration and drainage to his left foot.  Longstanding history of type 2 diabetes with neuropathy.  He is status post right-sided BKA for history of infection.  Recent left hand amputation for osteomyelitis.  Unfortunately has developed worsening wound to the lateral aspect of his left foot.  Was seen at [facility] ER on 525.  I was able to review this chart which showed a small ulcer on the distal aspect of the fifth MTPJ.  This has definitely progressed since that evaluation.  At this point he has developed severe swelling redness drainage foul odor to the lateral aspect of his left foot.  Patient admitted for further evaluation.  Past Medical History:  Diagnosis Date   Anxiety    CHF (congestive heart failure) (HCC)    EF 30-35%   Chronic diastolic (congestive) heart failure (HCC)    Depression    Diabetes mellitus without complication (HCC)    Hyperlipemia    Hypertension    Morbid obesity (HCC)    Peripheral edema    Past Surgical History:  Procedure Laterality Date   APPENDECTOMY     BELOW KNEE LEG AMPUTATION     Right leg   BIOPSY  10/14/2022   Procedure: BIOPSY;  Surgeon: Aundria, Ladell POUR, MD;  Location: [facility] ENDOSCOPY;  Service: Gastroenterology;;   COLONOSCOPY N/A 10/16/2022   Procedure: COLONOSCOPY;  Surgeon: Therisa Bi, MD;  Location: [facility] ENDOSCOPY;  Service: Gastroenterology;  Laterality: N/A;   ESOPHAGOGASTRODUODENOSCOPY (EGD) WITH PROPOFOL  N/A 10/26/2022   Procedure: ESOPHAGOGASTRODUODENOSCOPY (EGD) WITH PROPOFOL ;  Surgeon: Maryruth Ole DASEN, MD;  Location: [facility] ENDOSCOPY;  Service: Endoscopy;  Laterality: N/A;   ESOPHAGOGASTRODUODENOSCOPY (EGD) WITH PROPOFOL  N/A 10/14/2022   Procedure: ESOPHAGOGASTRODUODENOSCOPY (EGD) WITH PROPOFOL ;  Surgeon: Toledo, Ladell POUR, MD;  Location: [facility] ENDOSCOPY;   Service: Gastroenterology;  Laterality: N/A;   INCISION AND DRAINAGE ABSCESS Left 02/27/2023   Procedure: INCISION AND DRAINAGE ABSCESS LEFT FINGER AND ARM;  Surgeon: Robbin Redell Ned, MD;  Location: [facility] ORS;  Service: Orthopedics;  Laterality: Left;   TONSILLECTOMY     Social History   Socioeconomic History   Marital status: Legally Separated    Spouse name: Not on file   Number of children: Not on file   Years of education: Not on file   Highest education level: Not on file  Occupational History   Not on file  Tobacco Use   Smoking status: Never   Smokeless tobacco: Never  Vaping Use   Vaping status: Never Used  Substance and Sexual Activity   Alcohol use: No   Drug use: No   Sexual activity: Not Currently  Other Topics Concern   Not on file  Social History Narrative   Living with mother now   Wheelchair bound at baseline   Social Drivers of Health   Financial Resource Strain: Low Risk  (04/04/2023)   Received from [facility]   Overall Financial Resource Strain (CARDIA)    Difficulty of Paying Living Expenses: Not hard at all  Food Insecurity: No Food Insecurity (04/04/2023)   Received from [facility]   Hunger Vital Sign    Within the past 12 months, you worried that your food would run out before you got the money to buy more.: Never true    Within the past 12 months, the food you  bought just didn't last and you didn't have money to get more.: Never true  Transportation Needs: No Transportation Needs (04/04/2023)   Received from [facility] - Transportation    In the past 12 months, has lack of transportation kept you from medical appointments or from getting medications?: No    Lack of Transportation (Non-Medical): No  Physical Activity: Not on file  Stress: Not on file  Social Connections: Not on file   Family History  Problem Relation Age of Onset   COPD Mother    Diabetes Father    CAD  Father    Kidney cancer Brother    Allergies  Allergen Reactions   Fluoxetine     Other reaction(s): Hallucination   Shellfish Allergy Hives   Sulfa Antibiotics Hives   Vancomycin      Other reaction(s): Red Man Syndrome   Metformin Nausea Only   Prior to Admission medications   Medication Sig Start Date End Date Taking? Authorizing Provider  acetaminophen  (TYLENOL ) 325 MG tablet Take 2 tablets (650 mg total) by mouth every 6 (six) hours as needed for mild pain (pain score 1-3) (or Fever >/= 101). 03/02/23  Yes Barbarann Nest, MD  aspirin  EC 81 MG tablet Take 81 mg by mouth.  Take 1 tablet (81 mg total) by mouth once daily 04/22/23 04/22/24 Yes [provider]  atorvastatin  (LIPITOR) 40 MG tablet Take 40 mg by mouth daily.    Yes [provider]  baclofen  (LIORESAL ) 10 MG tablet Take by mouth.  One tablet, oral, Every 6 Hours - PRN 06/30/23  Yes [provider]  cetirizine (ZYRTEC) 10 MG tablet Take 10 mg by mouth daily as needed for allergies. 06/23/20  Yes [provider]  diphenhydrAMINE  (BENADRYL ) 25 mg capsule Take 1 capsule (25 mg total) by mouth every 6 (six) hours as needed for allergies or itching (30 minutes prior to starting Vancomycin ). 03/02/23  Yes Barbarann Nest, MD  DULoxetine  (CYMBALTA ) 30 MG capsule Take 90 mg by mouth daily.   Yes [provider]  finasteride  (PROSCAR ) 5 MG tablet Take 5 mg by mouth daily. 08/17/23  Yes [provider]  furosemide  (LASIX ) 80 MG tablet Take 80 mg by mouth 2 (two) times daily. 12/08/22  Yes [provider]  HUMULIN  R 100 UNIT/ML injection Inject 0.1 mLs (10 Units total) into the skin 3 (three) times daily with meals. 11/16/22  Yes Awanda City, MD  Insulin  Aspart FlexPen (NOVOLOG ) 100 UNIT/ML Inject into the skin.  Inject subcutaneously Before meals and at bedtime, sliding scale 05/16/23  Yes [provider]  insulin  glargine-yfgn (SEMGLEE ) 100 UNIT/ML injection Inject 0.2 mLs (20  Units total) into the skin at bedtime. 03/02/23  Yes Barbarann Nest, MD  metoprolol  succinate (TOPROL -XL) 100 MG 24 hr tablet Take 100 mg by mouth daily. 12/08/22  Yes [provider]  Oxycodone  HCl 10 MG TABS Take by mouth.   Yes [provider]  polyethylene glycol (MIRALAX  / GLYCOLAX ) 17 g packet Take 17 g by mouth daily as needed. 11/16/22  Yes Awanda City, MD  potassium chloride  (MICRO-K ) 10 MEQ CR capsule Take by mouth 2 (two) times daily. :1 Tablet(s) By Mouth Twice Daily   Yes [provider]  prednisoLONE acetate (PRED FORTE) 1 % ophthalmic suspension Apply 1 drop to eye. 08/09/23 09/06/23 Yes [provider]  pregabalin  (LYRICA ) 150 MG capsule Take 150 mg by mouth 3 (three) times daily. 08/15/23  Yes [provider]  senna-docusate (SENOKOT-S) 8.6-50 MG tablet Take 2 tablets by mouth daily as needed for mild constipation.   Yes [provider]  tamsulosin  (FLOMAX ) 0.4 MG CAPS capsule Take 0.8 mg by mouth daily.   Yes [provider]  buPROPion  (WELLBUTRIN  XL) 150 MG 24 hr tablet Take 1 tablet (150 mg total) by mouth daily. 11/06/22 12/06/22  Trudy Anthony HERO, MD  Insulin  Pen Needle 33G X 5 MM MISC 1 Dose by Does not apply route 2 (two) times daily. 10/19/22   Josette Ade, MD  ketorolac  (ACULAR ) 0.5 % ophthalmic solution Apply 1 drop to eye. Place 1 drop into the left eye 4 (four) times daily for 28 days Starting after surgery Patient not taking: Reported on 09/06/2023 08/09/23 09/06/23  [provider]  melatonin 5 MG TABS Take 1 tablet (5 mg total) by mouth at bedtime. 11/16/22   Awanda City, MD  ofloxacin (OCUFLOX) 0.3 % ophthalmic solution  08/09/23   [provider]  pantoprazole  (PROTONIX ) 40 MG tablet Take 1 tablet (40 mg total) by mouth 2 (two) times daily. Patient not taking: Reported on 09/06/2023 11/08/22   Patel, Sona, MD   MR FOOT LEFT W WO CONTRAST Result Date: 09/06/2023 CLINICAL DATA:  Diabetic foot wound. EXAM:  MRI OF THE LEFT FOREFOOT WITHOUT AND WITH CONTRAST TECHNIQUE: Multiplanar, multisequence MR imaging of the left forefoot was performed both before and after administration of intravenous contrast. CONTRAST:  10mL GADAVIST  GADOBUTROL  1 MMOL/ML IV SOLN COMPARISON:  Same day radiographs of the left foot dated 09/06/2023 at 7:16 a.m. FINDINGS: Bones/Joint/Cartilage Fracture of the base of the fifth metatarsal apophysis with erosive change along the lateral plantar margin. There is confluent T1 hypointense marrow signal abnormality about the fracture margins with mild T2/STIR hyperintensity and enhancement on postcontrast sequences. These findings are favored to reflect a subacute to chronic mildly displaced fracture of the fifth metatarsal base with osteomyelitis. No convincing marrow signal abnormality identified elsewhere to suggest osteomyelitis. Mild first MTP osteoarthritis. Moderate first through fifth TMT joint space narrowing. No joint effusion. Ligaments Collateral ligaments are intact.  Lisfranc ligament is intact. Muscles and Tendons Flexor and extensor compartment tendons are intact. Diffuse fatty atrophy of the intrinsic musculature of the foot likely reflects chronic denervation changes. Soft tissue Soft tissue ulceration/wound extending along the lateral midfoot extends deep to the lateral plantar margin of the underlying fifth metatarsal base. There is surrounding soft tissue edema and cutaneous thickening with enhancement, consistent with cellulitis. No loculated fluid collection. Subcutaneous edema extending along the dorsal foot most pronounced laterally. A 4 mm nonenhancing T2 hyperintense cystic structure adjacent to the plantar aspect of the third digit flexor tendon at the level of the third PIP joint likely reflects a ganglion cyst (series 5, image 40). IMPRESSION: 1. Soft tissue ulceration/wound extending along the lateral midfoot extends deep to the lateral plantar margin of the underlying  fifth metatarsal base. There is a mildly displaced likely subacute to chronic fracture of the base of the fifth metatarsal with erosive change and marrow signal abnormality concerning for subacute to chronic osteomyelitis. 2. Soft tissue wound along lateral midfoot with surrounding edema and enhancement, consistent with cellulitis. No abscess. 3. Moderate first through fifth TMT joint space narrowing. 4. Diffuse fatty atrophy of the intrinsic musculature of the foot likely reflects chronic denervation changes. Electronically Signed   By: Harrietta Sherry M.D.   On: 09/06/2023 13:38   DG Chest Portable 1 View Result Date: 09/06/2023 EXAM: 1 VIEW  XRAY OF THE CHEST 09/06/2023 07:16:34 AM COMPARISON: 10/23/2022 CLINICAL HISTORY: Shortness of breath, leg swelling, and wounds on legs. Patient unable to rotate or move leg. FINDINGS: LUNGS AND PLEURA: Mild dependent atelectasis is present at the left base. No airspace consolidation is present. No pleural effusion. No pneumothorax. HEART AND MEDIASTINUM: No acute abnormality of the cardiac and mediastinal silhouettes. BONES AND SOFT TISSUES: No acute osseous abnormality. IMPRESSION: 1. No acute findings. 2. Mild dependent atelectasis at the left base. Electronically signed by: Lonni Necessary MD 09/06/2023 07:42 AM EDT RP Workstation: [id number]   DG Foot Complete Left Result Date: 09/06/2023 EXAM: 3 or more VIEW(S) XRAY OF THE LEFT FOOT 09/06/2023 07:16:34 AM COMPARISON: None available. CLINICAL HISTORY: Shortness of breath, leg swelling, wounds on legs, and inability to rotate or move leg. FINDINGS: BONES AND JOINTS: A remote healed distal fibular fracture is present. Erosive changes are present at the base of the fifth metatarsal. No other acute osseous changes are present. SOFT TISSUES: Extensive soft tissue swelling is present about the foot. Ulceration and soft tissue gas was present over the lateral and plantar surface of the foot. IMPRESSION: 1. Extensive  soft tissue swelling with ulceration and soft tissue gas over the lateral and plantar surface of the foot. 2. Erosive changes at the base of the fifth metatarsal are concerning for osteomyelitis. 3. Remote healed distal fibular fracture. Electronically signed by: Lonni Necessary MD 09/06/2023 07:40 AM EDT RP Workstation: [id number]    Positive ROS: All other systems have been reviewed and were otherwise negative with the exception of those mentioned in the HPI and as above.  12 point ROS was performed.  Physical Exam: General: Alert and oriented.  No apparent distress.  Vascular:  Left foot:Dorsalis Pedis:  absent Posterior Tibial:  absent.  Severe edema to both lower extremities likely skews palpable pulses  Right foot: Status post below the knee amputation right side  Neuro: Patient is completely neuropathic  Derm: Large necrotic ulceration to the lateral aspect of the left foot overlying the fifth metatarsal region.  Smaller ulceration on the distal fifth MTPJ is noted.  Severe diffuse erythema to the entire left lower leg.  Blistering lesion into the left medial arch as well.  Ortho/MS: Patient is status post right-sided below the knee amputation.  Severe edema to the left lower extremity.  I have reviewed x-rays and MRI that shows pathological fracture of the fifth metatarsal base with osteomyelitis noted on MRI.  Severe diffuse edema to the left foot.  Assessment: Osteomyelitis left fifth metatarsal Diabetic neuropathy with ulceration Full-thickness ulcer to bone left foot   Plan: Patient with very large necrotic ulceration with osteomyelitis to the lateral aspect of the foot.  Severe cellulitis to the left lower extremity with severe edema.  Patient is completely neuropathic.  Likely perfusion is questionable at this time.  We discussed the possibility of attempting limb salvage but given the large necrotic area in the severe infection causing severe diffuse cellulitis and need  for removal of bone and the large defect that would occur with this procedure the likelihood of limb salvage is extremely low.  Patient understands this and after discussion of below the knee amputation the patient has requested to undergo below the knee amputation given the questionable and likely poor outcome that would come with debridement and attempted limb salvage at this time.  At this point we will defer to vascular surgery for evaluation.  For now we will continue to bandage as wound is  draining profusely with clear drainage with foul odor from the region.  Continue with IV antibiotics for now.    Ashley Eva LABOR, DPM Cell [phone]   09/06/2023 5:43 PM

## 2023-09-06 NOTE — ED Provider Notes (Signed)
 [facility] Provider Note    Event Date/Time   First MD Initiated Contact with Patient 09/06/23 0700     (approximate)   History   Leg Pain   HPI  Russell [name] is a 58 y.o. male who presents to the ED for evaluation of Leg Pain   I reviewed [name] ED visit from 5/25.  Morbidly obese patient with PAD, DM, atrial flutter, diastolic dysfunction.  Right BKA and right forearm amputation.  Seen for chronic pain and left foot ulcer  Patient presents from home via EMS for evaluation of worsening swelling, left foot pain, dependent sacral and pelvic pain.  Has not had any of his medications at home over the past few days, is been living at home by himself.  Reports feeling volume overloaded   Physical Exam   Triage Vital Signs: ED Triage Vitals  Encounter Vitals Group     BP 09/06/23 0632 131/73     Girls Systolic BP Percentile --      Girls Diastolic BP Percentile --      Boys Systolic BP Percentile --      Boys Diastolic BP Percentile --      Pulse Rate 09/06/23 0632 82     Resp 09/06/23 0632 20     Temp 09/06/23 0632 98.4 F (36.9 C)     Temp src --      SpO2 09/06/23 0632 95 %     Weight 09/06/23 0633 (!) 320 lb (145.2 kg)     Height 09/06/23 0633 5' 11 (1.803 m)     Head Circumference --      Peak Flow --      Pain Score 09/06/23 0633 10     Pain Loc --      Pain Education --      Exclude from Growth Chart --     Most recent vital signs: Vitals:   09/06/23 0800 09/06/23 0830  BP: 133/71 (!) 149/73  Pulse: 86 82  Resp: (!) 25 20  Temp:    SpO2: 96% 97%    General: Awake, no distress.  Morbidly obese and appears uncomfortable.  Right BKA and left distal upper extremity amputations. CV:  Good peripheral perfusion.  Resp:  Normal effort.  Abd:  No distention.  Soft MSK:  No deformity noted.  Pitting edema to bilateral lower extremities Neuro:  No focal deficits appreciated. Other:  Left foot, pictured below, lateral ulcerative  lesion, red, warm to the touch  When turning his side, no significant pressure ulcers   ED Results / Procedures / Treatments   Labs (all labs ordered are listed, but only abnormal results are displayed) Labs Reviewed  COMPREHENSIVE METABOLIC PANEL WITH GFR - Abnormal; Notable for the following components:      Result Value   Sodium 129 (*)    Chloride 96 (*)    Glucose, Bld 540 (*)    BUN 24 (*)    Calcium  8.4 (*)    Albumin  2.4 (*)    All other components within normal limits  CBC WITH DIFFERENTIAL/PLATELET - Abnormal; Notable for the following components:   WBC 14.6 (*)    Hemoglobin 9.0 (*)    HCT 29.8 (*)    MCV 69.1 (*)    MCH 20.9 (*)    RDW 16.5 (*)    Neutro Abs 13.0 (*)    Abs Immature Granulocytes 0.11 (*)    All other components within normal limits  PROTIME-INR -  Abnormal; Notable for the following components:   Prothrombin Time 17.5 (*)    INR 1.4 (*)    All other components within normal limits  OSMOLALITY - Abnormal; Notable for the following components:   Osmolality 307 (*)    All other components within normal limits  CULTURE, BLOOD (ROUTINE X 2)  CULTURE, BLOOD (ROUTINE X 2)  LACTIC ACID, PLASMA  PROCALCITONIN    EKG Sinus rhythm with a rate of 81 bpm.  Normal axis and intervals without clear signs of acute ischemia.  RADIOLOGY CXR interpreted by me without evidence of acute cardiopulmonary pathology. Plain film of the left foot interpreted by me with bony erosions concerning for osteomyelitis, free air subcutaneously  Official radiology report(s): DG Chest Portable 1 View Result Date: 09/06/2023 EXAM: 1 VIEW XRAY OF THE CHEST 09/06/2023 07:16:34 AM COMPARISON: 10/23/2022 CLINICAL HISTORY: Shortness of breath, leg swelling, and wounds on legs. Patient unable to rotate or move leg. FINDINGS: LUNGS AND PLEURA: Mild dependent atelectasis is present at the left base. No airspace consolidation is present. No pleural effusion. No pneumothorax. HEART AND  MEDIASTINUM: No acute abnormality of the cardiac and mediastinal silhouettes. BONES AND SOFT TISSUES: No acute osseous abnormality. IMPRESSION: 1. No acute findings. 2. Mild dependent atelectasis at the left base. Electronically signed by: Lonni Necessary MD 09/06/2023 07:42 AM EDT RP Workstation: [id number]   DG Foot Complete Left Result Date: 09/06/2023 EXAM: 3 or more VIEW(S) XRAY OF THE LEFT FOOT 09/06/2023 07:16:34 AM COMPARISON: None available. CLINICAL HISTORY: Shortness of breath, leg swelling, wounds on legs, and inability to rotate or move leg. FINDINGS: BONES AND JOINTS: A remote healed distal fibular fracture is present. Erosive changes are present at the base of the fifth metatarsal. No other acute osseous changes are present. SOFT TISSUES: Extensive soft tissue swelling is present about the foot. Ulceration and soft tissue gas was present over the lateral and plantar surface of the foot. IMPRESSION: 1. Extensive soft tissue swelling with ulceration and soft tissue gas over the lateral and plantar surface of the foot. 2. Erosive changes at the base of the fifth metatarsal are concerning for osteomyelitis. 3. Remote healed distal fibular fracture. Electronically signed by: Lonni Necessary MD 09/06/2023 07:40 AM EDT RP Workstation: [id number]    PROCEDURES and INTERVENTIONS:  .1-3 Lead EKG Interpretation  Performed by: Claudene Rover, MD Authorized by: Claudene Rover, MD     Interpretation: normal     ECG rate:  88   ECG rate assessment: normal     Rhythm: sinus rhythm     Ectopy: none     Conduction: normal   .Critical Care  Performed by: Claudene Rover, MD Authorized by: Claudene Rover, MD   Critical care provider statement:    Critical care time (minutes):  30   Critical care time was exclusive of:  Separately billable procedures and treating other patients   Critical care was necessary to treat or prevent imminent or life-threatening deterioration of the following conditions:   Sepsis   Critical care was time spent personally by me on the following activities:  Development of treatment plan with patient or surrogate, discussions with consultants, evaluation of patient's response to treatment, examination of patient, ordering and review of laboratory studies, ordering and review of radiographic studies, ordering and performing treatments and interventions, pulse oximetry, re-evaluation of patient's condition and review of old charts   Medications  linezolid  (ZYVOX ) IVPB 600 mg (has no administration in time range)  piperacillin -tazobactam (ZOSYN ) IVPB 3.375 g (has  no administration in time range)  sodium chloride  0.9 % bolus 2,000 mL (has no administration in time range)  HYDROmorphone  (DILAUDID ) injection 2 mg (2 mg Intravenous Given 09/06/23 0827)     IMPRESSION / MDM / ASSESSMENT AND PLAN / ED COURSE  I reviewed the triage vital signs and the nursing notes.  Differential diagnosis includes, but is not limited to, CHF exacerbation, sepsis or bacteremia, DKA or HHS, symptomatic anemia  {Patient presents with symptoms of an acute illness or injury that is potentially life-threatening.  Patient presents to the ED with signs of sepsis from osteomyelitis of his left foot requiring hospitalization.  Intermittent tachypnea, hypoxia while sleeping that I suspect is related to OSA and obesity hypoventilation.  Leukocytosis is noted.  Elevated procalcitonin but normal lactic acid.  elevated glucose with and minimally elevated osmolality.  Cultures are drawn and he is started on broad-spectrum antibiotics.  I consulted with pharmacy and medicine for admission.  Clinical Course as of 09/06/23 0911  Tue Sep 06, 2023  0825 When ordering antibiotics I am flag about patient's vancomycin  allergy, red man syndrome.  I therefore called the pharmacist and consult with them regarding antibiotic options.  They recommend linezolid  with Zosyn  for broad-spectrum coverage considering his  allergies, weight [DS]  0837 Reassessed, rolled on his side with nursing staff to examine his sacral [DS]  0850 I consult with hospitalist who agrees to admit [DS]    Clinical Course User Index [DS] Claudene Rover, MD     FINAL CLINICAL IMPRESSION(S) / ED DIAGNOSES   Final diagnoses:  None     Rx / DC Orders   ED Discharge Orders     None        Note:  This document was prepared using Dragon voice recognition software and may include unintentional dictation errors.   Claudene Rover, MD 09/06/23 [phone]

## 2023-09-06 NOTE — ED Triage Notes (Signed)
 Arrives [place] - EMS from home with bilateral leg pain. Has diabetic wounds on left leg that he has been unable to follow up with his PCP this week related to environmental factors. Left leg swelling, weeping and redness.   Right leg pain (BKA) where he wears prosthetic.   Denies fevers or chills.

## 2023-09-07 ENCOUNTER — Encounter
Admission: EM | Disposition: A | Discharge status: Home / Self Care | Payer: Self-pay | Source: Home / Self Care | Attending: Internal Medicine

## 2023-09-07 ENCOUNTER — Other Ambulatory Visit: Payer: Self-pay

## 2023-09-07 ENCOUNTER — Inpatient Hospital Stay: Admitting: Anesthesiology

## 2023-09-07 ENCOUNTER — Encounter: Payer: Self-pay | Admitting: Internal Medicine

## 2023-09-07 DIAGNOSIS — M86172 Other acute osteomyelitis, left ankle and foot: Secondary | ICD-10-CM

## 2023-09-07 DIAGNOSIS — Z9889 Other specified postprocedural states: Secondary | ICD-10-CM

## 2023-09-07 DIAGNOSIS — I87302 Chronic venous hypertension (idiopathic) without complications of left lower extremity: Secondary | ICD-10-CM

## 2023-09-07 DIAGNOSIS — E1142 Type 2 diabetes mellitus with diabetic polyneuropathy: Secondary | ICD-10-CM

## 2023-09-07 DIAGNOSIS — Z89202 Acquired absence of left upper limb, unspecified level: Secondary | ICD-10-CM

## 2023-09-07 DIAGNOSIS — Z89511 Acquired absence of right leg below knee: Secondary | ICD-10-CM

## 2023-09-07 DIAGNOSIS — M869 Osteomyelitis, unspecified: Secondary | ICD-10-CM

## 2023-09-07 HISTORY — PX: AMPUTATION: SHX166

## 2023-09-07 LAB — CBC
HCT: 26.5 % — ABNORMAL LOW (ref 39.0–52.0)
HCT: 27.9 % — ABNORMAL LOW (ref 39.0–52.0)
Hemoglobin: 8 g/dL — ABNORMAL LOW (ref 13.0–17.0)
Hemoglobin: 8.6 g/dL — ABNORMAL LOW (ref 13.0–17.0)
MCH: 20.8 pg — ABNORMAL LOW (ref 26.0–34.0)
MCH: 22.7 pg — ABNORMAL LOW (ref 26.0–34.0)
MCHC: 30.2 g/dL (ref 30.0–36.0)
MCHC: 30.8 g/dL (ref 30.0–36.0)
MCV: 69 fL — ABNORMAL LOW (ref 80.0–100.0)
MCV: 73.6 fL — ABNORMAL LOW (ref 80.0–100.0)
Platelets: 241 K/uL (ref 150–400)
Platelets: 256 K/uL (ref 150–400)
RBC: 3.84 MIL/uL — ABNORMAL LOW (ref 4.22–5.81)
RBC: 3.79 MIL/uL — ABNORMAL LOW (ref 4.22–5.81)
RDW: 16.5 % — ABNORMAL HIGH (ref 11.5–15.5)
RDW: 18.9 % — ABNORMAL HIGH (ref 11.5–15.5)
WBC: 11.2 K/uL — ABNORMAL HIGH (ref 4.0–10.5)
WBC: 10.4 K/uL (ref 4.0–10.5)
nRBC: 0.2 % (ref 0.0–0.2)
nRBC: 0.2 % (ref 0.0–0.2)

## 2023-09-07 LAB — BASIC METABOLIC PANEL WITH GFR
Anion gap: 11 (ref 5–15)
BUN: 26 mg/dL — ABNORMAL HIGH (ref 6–20)
CO2: 23 mmol/L (ref 22–32)
Calcium: 8.1 mg/dL — ABNORMAL LOW (ref 8.9–10.3)
Chloride: 98 mmol/L (ref 98–111)
Creatinine, Ser: 1.08 mg/dL (ref 0.61–1.24)
GFR, Estimated: >60 mL/min (ref >60)
Glucose, Bld: 189 mg/dL — ABNORMAL HIGH (ref 70–99)
Potassium: 3.7 mmol/L (ref 3.5–5.1)
Sodium: 132 mmol/L — ABNORMAL LOW (ref 135–145)

## 2023-09-07 LAB — GLUCOSE, CAPILLARY
Glucose-Capillary: 177 mg/dL — ABNORMAL HIGH (ref 70–99)
Glucose-Capillary: 176 mg/dL — ABNORMAL HIGH (ref 70–99)
Glucose-Capillary: 208 mg/dL — ABNORMAL HIGH (ref 70–99)
Glucose-Capillary: 245 mg/dL — ABNORMAL HIGH (ref 70–99)
Glucose-Capillary: 322 mg/dL — ABNORMAL HIGH (ref 70–99)
Glucose-Capillary: 374 mg/dL — ABNORMAL HIGH (ref 70–99)

## 2023-09-07 LAB — MRSA NEXT GEN BY PCR, NASAL: MRSA by PCR Next Gen: DETECTED — AB

## 2023-09-07 LAB — PREPARE RBC (CROSSMATCH)

## 2023-09-07 SURGERY — AMPUTATION, ABOVE KNEE
Anesthesia: General | Site: Knee | Laterality: Left

## 2023-09-07 MED ORDER — 0.9 % SODIUM CHLORIDE (POUR BTL) OPTIME
TOPICAL | Status: DC | PRN
  Administered 2023-09-07: 1000 mL

## 2023-09-07 MED ORDER — ALBUMIN HUMAN 5 % IV SOLN
INTRAVENOUS | Status: AC
  Filled 2023-09-07: qty 250

## 2023-09-07 MED ORDER — KETAMINE HCL 50 MG/5ML IJ SOSY
PREFILLED_SYRINGE | INTRAMUSCULAR | Status: AC
Start: 2023-09-07 — End: 2023-09-07
  Filled 2023-09-07: qty 5

## 2023-09-07 MED ORDER — ALBUMIN HUMAN 5 % IV SOLN
INTRAVENOUS | Status: DC | PRN
Start: 2023-09-07 — End: 2023-09-07

## 2023-09-07 MED ORDER — SODIUM CHLORIDE 0.9% IV SOLUTION
Freq: Once | INTRAVENOUS | Status: AC
Start: 2023-09-07 — End: 2023-09-07

## 2023-09-07 MED ORDER — CEFAZOLIN SODIUM-DEXTROSE 2-4 GM/100ML-% IV SOLN
2.0000 g | INTRAVENOUS | Status: AC
  Administered 2023-09-07: 2 g via INTRAVENOUS

## 2023-09-07 MED ORDER — KETAMINE HCL 10 MG/ML IJ SOLN
INTRAMUSCULAR | Status: DC | PRN
  Administered 2023-09-07: 30 mg via INTRAVENOUS

## 2023-09-07 MED ORDER — HYDROMORPHONE HCL 1 MG/ML IJ SOLN
INTRAMUSCULAR | Status: AC
Start: 2023-09-07 — End: 2023-09-07
  Filled 2023-09-07: qty 1

## 2023-09-07 MED ORDER — ACETAMINOPHEN 10 MG/ML IV SOLN
INTRAVENOUS | Status: AC
  Filled 2023-09-07: qty 100

## 2023-09-07 MED ORDER — PROPOFOL 10 MG/ML IV BOLUS
INTRAVENOUS | Status: AC
Start: 2023-09-07 — End: 2023-09-07
  Filled 2023-09-07: qty 40

## 2023-09-07 MED ORDER — ONDANSETRON HCL 4 MG/2ML IJ SOLN
INTRAMUSCULAR | Status: DC | PRN
Start: 2023-09-07 — End: 2023-09-07
  Administered 2023-09-07: 4 mg via INTRAVENOUS

## 2023-09-07 MED ORDER — MUPIROCIN 2 % EX OINT
1.0000 | TOPICAL_OINTMENT | Freq: Two times a day (BID) | CUTANEOUS | Status: AC
  Administered 2023-09-07 – 2023-09-11 (×9): 1 via NASAL
  Filled 2023-09-07: qty 22

## 2023-09-07 MED ORDER — SODIUM CHLORIDE 0.9% IV SOLUTION: Freq: Once | INTRAVENOUS | Status: AC

## 2023-09-07 MED ORDER — CEFAZOLIN SODIUM-DEXTROSE 2-4 GM/100ML-% IV SOLN
INTRAVENOUS | Status: AC
  Filled 2023-09-07: qty 100

## 2023-09-07 MED ORDER — DROPERIDOL 2.5 MG/ML IJ SOLN
INTRAMUSCULAR | Status: AC
Start: 2023-09-07 — End: 2023-09-07
  Filled 2023-09-07: qty 2

## 2023-09-07 MED ORDER — PHENYLEPHRINE 80 MCG/ML (10ML) SYRINGE FOR IV PUSH (FOR BLOOD PRESSURE SUPPORT)
PREFILLED_SYRINGE | INTRAVENOUS | Status: DC | PRN
  Administered 2023-09-07: 160 ug via INTRAVENOUS
  Administered 2023-09-07: 80 ug via INTRAVENOUS
  Administered 2023-09-07 (×2): 160 ug via INTRAVENOUS
  Administered 2023-09-07: 80 ug via INTRAVENOUS

## 2023-09-07 MED ORDER — MORPHINE SULFATE (PF) 2 MG/ML IV SOLN
2.0000 mg | INTRAVENOUS | Status: DC | PRN
  Administered 2023-09-08 – 2023-09-09 (×4): 2 mg via INTRAVENOUS
  Filled 2023-09-07 (×4): qty 1

## 2023-09-07 MED ORDER — ACETAMINOPHEN 10 MG/ML IV SOLN
INTRAVENOUS | Status: DC | PRN
  Administered 2023-09-07: 1000 mg via INTRAVENOUS

## 2023-09-07 MED ORDER — OXYCODONE HCL 5 MG PO TABS
5.0000 mg | ORAL_TABLET | ORAL | Status: DC | PRN
  Administered 2023-09-07 – 2023-09-09 (×6): 10 mg via ORAL
  Administered 2023-09-10: 5 mg via ORAL
  Administered 2023-09-10: 10 mg via ORAL
  Filled 2023-09-07 (×3): qty 2
  Filled 2023-09-07: qty 1
  Filled 2023-09-07 (×4): qty 2

## 2023-09-07 MED ORDER — FENTANYL CITRATE (PF) 100 MCG/2ML IJ SOLN
INTRAMUSCULAR | Status: AC
  Filled 2023-09-07: qty 2

## 2023-09-07 MED ORDER — JUVEN PO PACK
1.0000 | PACK | Freq: Two times a day (BID) | ORAL | Status: DC
  Administered 2023-09-08 – 2023-09-12 (×9): 1 via ORAL
  Filled 2023-09-07: qty 1

## 2023-09-07 MED ORDER — PHENYLEPHRINE HCL-NACL 20-0.9 MG/250ML-% IV SOLN
INTRAVENOUS | Status: DC | PRN
  Administered 2023-09-07: 50 ug/min via INTRAVENOUS

## 2023-09-07 MED ORDER — EPHEDRINE SULFATE-NACL 50-0.9 MG/10ML-% IV SOSY
PREFILLED_SYRINGE | INTRAVENOUS | Status: DC | PRN
  Administered 2023-09-07: 5 mg via INTRAVENOUS
  Administered 2023-09-07: 10 mg via INTRAVENOUS

## 2023-09-07 MED ORDER — METHOCARBAMOL 500 MG PO TABS
500.0000 mg | ORAL_TABLET | Freq: Three times a day (TID) | ORAL | Status: DC
  Administered 2023-09-07 – 2023-09-08 (×2): 500 mg via ORAL
  Filled 2023-09-07 (×2): qty 1

## 2023-09-07 MED ORDER — ALBUMIN HUMAN 5 % IV SOLN: INTRAVENOUS | Status: DC | PRN

## 2023-09-07 MED ORDER — INSULIN ASPART 100 UNIT/ML IJ SOLN
INTRAMUSCULAR | Status: AC
  Filled 2023-09-07: qty 5

## 2023-09-07 MED ORDER — DEXAMETHASONE SODIUM PHOSPHATE 10 MG/ML IJ SOLN
INTRAMUSCULAR | Status: DC | PRN
  Administered 2023-09-07: 5 mg via INTRAVENOUS

## 2023-09-07 MED ORDER — INSULIN ASPART 100 UNIT/ML IJ SOLN
5.0000 [IU] | Freq: Once | INTRAMUSCULAR | Status: AC
  Administered 2023-09-07: 5 [IU] via SUBCUTANEOUS

## 2023-09-07 MED ORDER — CHLORHEXIDINE GLUCONATE 4 % EX SOLN: 60.0000 mL | Freq: Once | CUTANEOUS | Status: DC

## 2023-09-07 MED ORDER — HYDROMORPHONE HCL 1 MG/ML IJ SOLN
0.5000 mg | INTRAMUSCULAR | Status: AC | PRN
  Administered 2023-09-07 (×4): 0.5 mg via INTRAVENOUS

## 2023-09-07 MED ORDER — ENSURE MAX PROTEIN PO LIQD
11.0000 [oz_av] | Freq: Every day | ORAL | Status: DC
  Administered 2023-09-08 – 2023-09-11 (×3): 11 [oz_av] via ORAL

## 2023-09-07 MED ORDER — LIDOCAINE HCL (CARDIAC) PF 100 MG/5ML IV SOSY
PREFILLED_SYRINGE | INTRAVENOUS | Status: DC | PRN
  Administered 2023-09-07: 100 mg via INTRAVENOUS

## 2023-09-07 MED ORDER — MIDAZOLAM HCL 2 MG/2ML IJ SOLN
INTRAMUSCULAR | Status: AC
  Filled 2023-09-07: qty 2

## 2023-09-07 MED ORDER — PROPOFOL 10 MG/ML IV BOLUS
INTRAVENOUS | Status: DC | PRN
  Administered 2023-09-07: 200 mg via INTRAVENOUS

## 2023-09-07 MED ORDER — SUGAMMADEX SODIUM 200 MG/2ML IV SOLN
INTRAVENOUS | Status: DC | PRN
Start: 2023-09-07 — End: 2023-09-07
  Administered 2023-09-07: 400 mg via INTRAVENOUS

## 2023-09-07 MED ORDER — ZINC SULFATE 220 (50 ZN) MG PO CAPS
220.0000 mg | ORAL_CAPSULE | Freq: Every day | ORAL | Status: DC
  Administered 2023-09-08 – 2023-09-13 (×6): 220 mg via ORAL
  Filled 2023-09-07 (×7): qty 1

## 2023-09-07 MED ORDER — ADULT MULTIVITAMIN W/MINERALS CH
1.0000 | ORAL_TABLET | Freq: Every day | ORAL | Status: DC
  Administered 2023-09-08 – 2023-09-13 (×6): 1 via ORAL
  Filled 2023-09-07 (×6): qty 1

## 2023-09-07 MED ORDER — FENTANYL CITRATE (PF) 100 MCG/2ML IJ SOLN
INTRAMUSCULAR | Status: DC | PRN
  Administered 2023-09-07 (×2): 50 ug via INTRAVENOUS

## 2023-09-07 MED ORDER — SODIUM CHLORIDE 0.9 % IV SOLN: INTRAVENOUS | Status: DC

## 2023-09-07 MED ORDER — LACTATED RINGERS IV SOLN: INTRAVENOUS | Status: DC | PRN

## 2023-09-07 MED ORDER — ROCURONIUM BROMIDE 100 MG/10ML IV SOLN
INTRAVENOUS | Status: DC | PRN
  Administered 2023-09-07: 50 mg via INTRAVENOUS

## 2023-09-07 MED ORDER — SUCCINYLCHOLINE CHLORIDE 200 MG/10ML IV SOSY
PREFILLED_SYRINGE | INTRAVENOUS | Status: DC | PRN
  Administered 2023-09-07: 200 mg via INTRAVENOUS

## 2023-09-07 MED ORDER — PHENYLEPHRINE HCL-NACL 20-0.9 MG/250ML-% IV SOLN
INTRAVENOUS | Status: AC
  Filled 2023-09-07: qty 250

## 2023-09-07 MED ORDER — DROPERIDOL 2.5 MG/ML IJ SOLN: 0.6250 mg | Freq: Once | INTRAMUSCULAR | Status: DC | PRN

## 2023-09-07 MED ORDER — FENTANYL CITRATE (PF) 100 MCG/2ML IJ SOLN
25.0000 ug | INTRAMUSCULAR | Status: DC | PRN
  Administered 2023-09-07 (×2): 50 ug via INTRAVENOUS

## 2023-09-07 MED ORDER — VITAMIN C 500 MG PO TABS
500.0000 mg | ORAL_TABLET | Freq: Two times a day (BID) | ORAL | Status: DC
  Administered 2023-09-08 – 2023-09-13 (×11): 500 mg via ORAL
  Filled 2023-09-07 (×11): qty 1

## 2023-09-07 MED ORDER — DROPERIDOL 2.5 MG/ML IJ SOLN
0.6250 mg | Freq: Once | INTRAMUSCULAR | Status: AC
  Administered 2023-09-07: 0.625 mg via INTRAVENOUS
  Filled 2023-09-07: qty 2

## 2023-09-07 MED ORDER — ONDANSETRON HCL 4 MG/2ML IJ SOLN
INTRAMUSCULAR | Status: AC
  Filled 2023-09-07: qty 2

## 2023-09-07 SURGICAL SUPPLY — 37 items
BLADE SAGITTAL WIDE XTHICK NO (BLADE) ×1 IMPLANT
BNDG COHESIVE 4X5 TAN STRL LF (GAUZE/BANDAGES/DRESSINGS) ×1 IMPLANT
BNDG COMPR 6X5.8 VLCR NS LF (GAUZE/BANDAGES/DRESSINGS) ×1 IMPLANT
BNDG GAUZE DERMACEA FLUFF 4 (GAUZE/BANDAGES/DRESSINGS) ×2 IMPLANT
BRUSH SCRUB EZ 4% CHG (MISCELLANEOUS) ×1 IMPLANT
CHLORAPREP W/TINT 26 (MISCELLANEOUS) ×1 IMPLANT
DRAPE INCISE IOBAN 66X45 STRL (DRAPES) ×1 IMPLANT
DRAPE INCISE IOBAN 66X60 STRL (DRAPES) ×1 IMPLANT
DRSG XEROFORM 1X8 (GAUZE/BANDAGES/DRESSINGS) IMPLANT
ELECT CAUTERY BLADE 6.4 (BLADE) ×1 IMPLANT
ELECTRODE REM PT RTRN 9FT ADLT (ELECTROSURGICAL) ×1 IMPLANT
GAUZE XEROFORM 1X8 LF (GAUZE/BANDAGES/DRESSINGS) ×2 IMPLANT
GLOVE BIO SURGEON STRL SZ7 (GLOVE) ×2 IMPLANT
GOWN STRL REUS W/ TWL LRG LVL3 (GOWN DISPOSABLE) ×2 IMPLANT
GOWN STRL REUS W/TWL 2XL LVL3 (GOWN DISPOSABLE) ×2 IMPLANT
HANDLE YANKAUER SUCT BULB TIP (MISCELLANEOUS) ×1 IMPLANT
KIT TURNOVER KIT A (KITS) ×1 IMPLANT
LABEL OR SOLS (LABEL) ×1 IMPLANT
MANIFOLD NEPTUNE II (INSTRUMENTS) ×1 IMPLANT
MAT ABSORB FLUID 56X50 GRAY (MISCELLANEOUS) ×1 IMPLANT
MAT PREVALON FULL STRYKER (MISCELLANEOUS) IMPLANT
NS IRRIG 1000ML POUR BTL (IV SOLUTION) ×1 IMPLANT
NS IRRIG 500ML POUR BTL (IV SOLUTION) IMPLANT
PACK EXTREMITY ARMC (MISCELLANEOUS) ×1 IMPLANT
PAD ABD DERMACEA PRESS 5X9 (GAUZE/BANDAGES/DRESSINGS) ×2 IMPLANT
PAD PREP OB/GYN DISP 24X41 (PERSONAL CARE ITEMS) ×1 IMPLANT
PENCIL SMOKE EVACUATOR (MISCELLANEOUS) ×1 IMPLANT
SPONGE T-LAP 18X18 ~~LOC~~+RFID (SPONGE) ×2 IMPLANT
STAPLER SKIN PROX 35W (STAPLE) ×1 IMPLANT
STOCKINETTE M/LG 89821 (MISCELLANEOUS) ×1 IMPLANT
SUT ETHILON 2 0 FS 18 (SUTURE) IMPLANT
SUT SILK 2 0 SH (SUTURE) ×2 IMPLANT
SUT SILK 2-0 18XBRD TIE 12 (SUTURE) ×1 IMPLANT
SUT VIC AB 0 CT1 36 (SUTURE) ×2 IMPLANT
SUT VIC AB 2-0 CT1 (SUTURE) ×2 IMPLANT
TRAP FLUID SMOKE EVACUATOR (MISCELLANEOUS) ×1 IMPLANT
WATER STERILE IRR 500ML POUR (IV SOLUTION) ×1 IMPLANT

## 2023-09-07 NOTE — TOC Initial Note (Signed)
 Transition of Care [facility]) - Initial/Assessment Note    Patient Details  Name: Russell [name] MRN: [id number] Date of Birth: 08-17-1965  Transition of Care [facility]) CM/SW Contact:    Tomasa JAYSON Childes, RN Phone Number: 09/07/2023, 2:51 PM  Clinical Narrative:                 Attempt to complete risk assessment. Patient not in room.      Barriers to Discharge: Continued Medical Work up   Patient Goals and CMS Choice     Choice offered to / list presented to : Patient      Expected Discharge Plan and Services In-house Referral: Clinical Social Work     Living arrangements for the past 2 months: Single Family Home                                      Prior Living Arrangements/Services Living arrangements for the past 2 months: Single Family Home Lives with:: Self (states that wife comes from time to time, she is on the lease) Patient language and need for interpreter reviewed:: Yes Do you feel safe going back to the place where you live?: Yes          Current home services: DME (electric chair, rollator, walker) Criminal Activity/Legal Involvement Pertinent to Current Situation/Hospitalization: No - Comment as needed  Activities of Daily Living   ADL Screening (condition at time of admission) Independently performs ADLs?: No Does the patient have a NEW difficulty with bathing/dressing/toileting/self-feeding that is expected to last >3 days?: Yes (Initiates electronic notice to provider for possible OT consult) Does the patient have a NEW difficulty with getting in/out of bed, walking, or climbing stairs that is expected to last >3 days?: Yes (Initiates electronic notice to provider for possible PT consult) Does the patient have a NEW difficulty with communication that is expected to last >3 days?: Yes (Initiates electronic notice to provider for possible SLP consult) Is the patient deaf or have difficulty hearing?: No Does the patient have difficulty seeing, even when  wearing glasses/contacts?: No Does the patient have difficulty concentrating, remembering, or making decisions?: No  Permission Sought/Granted   Permission granted to share information with : Yes, Verbal Permission Granted (wife- Idell)              Emotional Assessment Appearance:: Appears stated age Attitude/Demeanor/Rapport: Engaged Affect (typically observed): Appropriate Orientation: : Oriented to Situation, Oriented to  Time, Oriented to Place, Oriented to Self   Psych Involvement: No (comment)  Admission diagnosis:  Osteomyelitis (HCC) [M86.9] Other acute osteomyelitis of left foot (HCC) [F13.827] Patient Active Problem List   Diagnosis Date Noted   Acute osteomyelitis of metatarsal bone of left foot (HCC) 09/06/2023   OSA (obstructive sleep apnea) 09/06/2023   Chronic respiratory failure with hypoxia (HCC) 09/06/2023   Sepsis due to cellulitis (HCC) 02/27/2023   PAD (peripheral artery disease) (HCC) 11/10/2022   Generalized weakness 11/09/2022   Ambulatory dysfunction 11/09/2022   Residual hemorrhoidal skin tags 11/06/2022   Acute gastritis 11/06/2022   Major depressive disorder, recurrent episode, moderate (HCC) 10/28/2022   Dyslipidemia 10/24/2022   Anxiety and depression 10/24/2022   Essential hypertension 10/24/2022   Type 2 diabetes mellitus with peripheral neuropathy (HCC) 10/24/2022   Acute gastric ulcer with hemorrhage 10/24/2022   Acute urinary retention 10/17/2022   Acute respiratory failure with hypoxia (HCC) 10/15/2022   Multiple gastric ulcers  10/14/2022   Hyperosmolar hyperglycemic state (HHS) (HCC) 10/13/2022   HLD (hyperlipidemia) 10/13/2022   Acute on chronic diastolic CHF (congestive heart failure) (HCC) 10/13/2022   Depression with anxiety 10/13/2022   Hyperkalemia 10/13/2022   Elevated lactic acid level 10/13/2022   Rectal bleeding 10/13/2022   Acute blood loss anemia 10/13/2022   Wound infection_ left lower leg 10/13/2022   Upper GI bleed  10/13/2022   Hypovolemic shock (HCC) 10/13/2022   Epigastric abdominal pain 10/13/2022   Sciatica of right side 10/13/2022   AKI (acute kidney injury) (HCC) 10/13/2022   Hypotension 11/21/2020   Atrial flutter with rapid ventricular response (HCC) 11/21/2020   Chronic kidney disease, stage 4, severely decreased GFR (HCC) 06/14/2018   Chronic venous insufficiency 06/14/2018   Cellulitis of left lower extremity 02/01/2018   Cellulitis 05/18/2017   Hyperlipidemia, mixed 01/10/2017   Cellulitis and abscess of left leg 08/08/2016   Diabetes with ulcer of leg (HCC) 08/08/2016   Hyperglycemia 08/08/2016   Morbid obesity (HCC) 08/08/2016   Cellulitis and abscess of leg 08/08/2016   Adjustment disorder with depressed mood 11/21/2014   Other social stressor 11/21/2014   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   Diabetes (HCC) 11/21/2014   PCP:  Eliverto [name], MD Pharmacy:   [facility] [address] ([address]), [place] - [address] [place] ([place]) [place] [address] Phone: [phone] Fax: [phone]     Social Drivers of Health (SDOH) Social History: SDOH Screenings   Food Insecurity: Unknown (09/07/2023)  Housing: Low Risk  (09/07/2023)  Transportation Needs: No Transportation Needs (09/07/2023)  Utilities: Not At Risk (09/07/2023)  Financial Resource Strain: Low Risk  (04/04/2023)   Received from [facility]  Tobacco Use: Low Risk  (09/07/2023)   SDOH Interventions:     Readmission Risk Interventions    03/03/2023   12:27 PM 10/25/2022    3:55 PM  Readmission Risk Prevention Plan  Transportation Screening Complete Complete  PCP or Specialist Appt within 3-5 Days  Complete  HRI or Home Care Consult  Complete  Social Work Consult for Recovery Care Planning/Counseling  Complete  Palliative Care Screening  Not Applicable  Medication Review Oceanographer) Complete Complete  PCP or Specialist appointment within  3-5 days of discharge Complete   HRI or Home Care Consult Complete   Palliative Care Screening Not Applicable   Skilled Nursing Facility Not Applicable

## 2023-09-07 NOTE — Anesthesia Procedure Notes (Signed)
 Procedure Name: Intubation Date/Time: 09/07/2023 1:31 PM  Performed by: Brien Sotero PARAS, CRNAPre-anesthesia Checklist: Patient identified, Patient being monitored, Timeout performed, Emergency Drugs available and Suction available Patient Re-evaluated:Patient Re-evaluated prior to induction Oxygen Delivery Method: Circle system utilized Preoxygenation: Pre-oxygenation with 100% oxygen Induction Type: Rapid sequence, IV induction and Cricoid Pressure applied Laryngoscope Size: McGrath and 4 Grade View: Grade I Tube type: Oral Tube size: 7.5 mm Number of attempts: 1 Airway Equipment and Method: Stylet, Video-laryngoscopy and Patient positioned with wedge pillow Placement Confirmation: ETT inserted through vocal cords under direct vision, positive ETCO2 and breath sounds checked- equal and bilateral Secured at: 23 cm Tube secured with: Tape Dental Injury: Teeth and Oropharynx as per pre-operative assessment

## 2023-09-07 NOTE — Consult Note (Signed)
 Hospital Consult    Reason for Consult:  Left Lower extremity Infection with Osteomyelitis  Requesting Physician:  Dr Eva Gay MD  MRN #:  [id number]  History of Present Illness: This is a 58 y.o. male with PMH of decubitus ulcer of sacrum and left foot ulcer, left hand osteomyelitis S/P transradial amputation in 2/25, right BKA, PAD, history of GI bleed, history of a flutter, HTN, morbid obesity, OSA not on CPAP, depression, uncontrolled type II DM with neuropathy,chronic HFpEF, chronic back pain, presented to the hospital with foul-smelling wound infection on his left foot.  Patient was last seen on 07/24/2023 at [facility] ER for his left foot ulcer.  At that time he was prescribed pain medication and a referral was placed to podiatry as well as urology for urination difficulties.  He endorses he has not seen podiatry or urology since leaving the emergency department.  His main concern today is the 1 ulcer that he had in his foot which is now turning into multiple ulcers that are getting worse.  He acknowledges it has a foul smell today.  Patient does not know what his hemoglobin A1c is from workup.  In the emergency department the patient's blood sugar was 500.  He reports that he ran out of his insulin  because he has not followed up with his PCP and therefore was using [employer]'s short acting insulin .  He endorses he is used up to 80 units a day due to his inability to follow a diabetic diet.  On workup patient underwent an MRI of his left foot.  This reveals soft tissue ulceration and wound extending along the lateral midfoot that extends deep to the lateral plantar margins.  It also shows at the base of the fifth metatarsal erosive changes with marrow changes concerning for subacute to chronic osteomyelitis.  Vascular surgery was consulted by podiatry for further workup and either above-the-knee or below the knee amputation.  Past Medical History:  Diagnosis Date   Anxiety    CHF (congestive  heart failure) (HCC)    EF 30-35%   Chronic diastolic (congestive) heart failure (HCC)    Depression    Diabetes mellitus without complication (HCC)    Hyperlipemia    Hypertension    Morbid obesity (HCC)    Peripheral edema     Past Surgical History:  Procedure Laterality Date   APPENDECTOMY     BELOW KNEE LEG AMPUTATION     Right leg   BIOPSY  10/14/2022   Procedure: BIOPSY;  Surgeon: Aundria, Ladell POUR, MD;  Location: [facility] ENDOSCOPY;  Service: Gastroenterology;;   COLONOSCOPY N/A 10/16/2022   Procedure: COLONOSCOPY;  Surgeon: Therisa Bi, MD;  Location[facility] ENDOSCOPY;  Service: Gastroenterology;  Laterality: N/A;   ESOPHAGOGASTRODUODENOSCOPY (EGD) WITH PROPOFOL  N/A 10/26/2022   Procedure: ESOPHAGOGASTRODUODENOSCOPY (EGD) WITH PROPOFOL ;  Surgeon: Maryruth Ole DASEN, MD;  Location: [facility] ENDOSCOPY;  Service: Endoscopy;  Laterality: N/A;   ESOPHAGOGASTRODUODENOSCOPY (EGD) WITH PROPOFOL  N/A 10/14/2022   Procedure: ESOPHAGOGASTRODUODENOSCOPY (EGD) WITH PROPOFOL ;  Surgeon: Toledo, Ladell POUR, MD;  Location: [facility] ENDOSCOPY;  Service: Gastroenterology;  Laterality: N/A;   INCISION AND DRAINAGE ABSCESS Left 02/27/2023   Procedure: INCISION AND DRAINAGE ABSCESS LEFT FINGER AND ARM;  Surgeon: Robbin Redell Ned, MD;  Location: [facility] ORS;  Service: Orthopedics;  Laterality: Left;   TONSILLECTOMY      Allergies  Allergen Reactions   Fluoxetine     Other reaction(s): Hallucination   Shellfish Allergy Hives   Sulfa Antibiotics Hives   Vancomycin   Other reaction(s): Red Man Syndrome   Metformin Nausea Only    Prior to Admission medications   Medication Sig Start Date End Date Taking? Authorizing Provider  acetaminophen  (TYLENOL ) 325 MG tablet Take 2 tablets (650 mg total) by mouth every 6 (six) hours as needed for mild pain (pain score 1-3) (or Fever >/= 101). 03/02/23  Yes Barbarann Nest, MD  aspirin  EC 81 MG tablet Take 81 mg by mouth.  Take 1 tablet (81 mg total) by mouth once daily  04/22/23 04/22/24 Yes [provider]  atorvastatin  (LIPITOR) 40 MG tablet Take 40 mg by mouth daily.    Yes [provider]  baclofen  (LIORESAL ) 10 MG tablet Take by mouth.  One tablet, oral, Every 6 Hours - PRN 06/30/23  Yes [provider]  cetirizine (ZYRTEC) 10 MG tablet Take 10 mg by mouth daily as needed for allergies. 06/23/20  Yes [provider]  diphenhydrAMINE  (BENADRYL ) 25 mg capsule Take 1 capsule (25 mg total) by mouth every 6 (six) hours as needed for allergies or itching (30 minutes prior to starting Vancomycin ). 03/02/23  Yes Barbarann Nest, MD  DULoxetine  (CYMBALTA ) 30 MG capsule Take 90 mg by mouth daily.   Yes [provider]  finasteride  (PROSCAR ) 5 MG tablet Take 5 mg by mouth daily. 08/17/23  Yes [provider]  furosemide  (LASIX ) 80 MG tablet Take 80 mg by mouth 2 (two) times daily. 12/08/22  Yes [provider]  HUMULIN  R 100 UNIT/ML injection Inject 0.1 mLs (10 Units total) into the skin 3 (three) times daily with meals. 11/16/22  Yes Awanda City, MD  Insulin  Aspart FlexPen (NOVOLOG ) 100 UNIT/ML Inject into the skin.  Inject subcutaneously Before meals and at bedtime, sliding scale 05/16/23  Yes [provider]  insulin  glargine-yfgn (SEMGLEE ) 100 UNIT/ML injection Inject 0.2 mLs (20 Units total) into the skin at bedtime. 03/02/23  Yes Barbarann Nest, MD  metoprolol  succinate (TOPROL -XL) 100 MG 24 hr tablet Take 100 mg by mouth daily. 12/08/22  Yes [provider]  Oxycodone  HCl 10 MG TABS Take by mouth.   Yes [provider]  polyethylene glycol (MIRALAX  / GLYCOLAX ) 17 g packet Take 17 g by mouth daily as needed. 11/16/22  Yes Awanda City, MD  potassium chloride  (MICRO-K ) 10 MEQ CR capsule Take by mouth 2 (two) times daily. :1 Tablet(s) By Mouth Twice Daily   Yes [provider]  pregabalin  (LYRICA ) 150 MG capsule Take 150 mg by mouth 3 (three) times daily. 08/15/23  Yes [provider]  senna-docusate (SENOKOT-S) 8.6-50 MG tablet Take 2 tablets by mouth daily as needed for mild constipation.   Yes [provider]  tamsulosin  (FLOMAX ) 0.4 MG CAPS capsule Take 0.8 mg by mouth daily.   Yes [provider]  buPROPion  (WELLBUTRIN  XL) 150 MG 24 hr tablet Take 1 tablet (150 mg total) by mouth daily. 11/06/22 12/06/22  Trudy Anthony HERO, MD  Insulin  Pen Needle 33G X 5 MM MISC 1 Dose by Does not apply route 2 (two) times daily. 10/19/22   Josette Ade, MD  melatonin 5 MG TABS Take 1 tablet (5 mg total) by mouth at bedtime. 11/16/22   Awanda City, MD  ofloxacin (OCUFLOX) 0.3 % ophthalmic solution  08/09/23   [provider]  pantoprazole  (PROTONIX ) 40 MG tablet Take 1 tablet (40 mg total) by mouth 2 (two) times daily. Patient not taking: Reported on 09/06/2023 11/08/22   Patel, Sona, MD    Social History  Socioeconomic History   Marital status: Legally Separated    Spouse name: Not on file   Number of children: Not on file   Years of education: Not on file   Highest education level: Not on file  Occupational History   Not on file  Tobacco Use   Smoking status: Never   Smokeless tobacco: Never  Vaping Use   Vaping status: Never Used  Substance and Sexual Activity   Alcohol use: No   Drug use: No   Sexual activity: Not Currently  Other Topics Concern   Not on file  Social History Narrative   Living with mother now   Wheelchair bound at baseline   Social Drivers of Health   Financial Resource Strain: Low Risk  (04/04/2023)   Received from [facility]   Overall Financial Resource Strain (CARDIA)    Difficulty of Paying Living Expenses: Not hard at all  Food Insecurity: Unknown (09/07/2023)   Hunger Vital Sign    Worried About Running Out of Food in the Last Year: Not on file    Ran Out of Food in the Last Year: Patient declined  Transportation Needs: No Transportation Needs (09/07/2023)   PRAPARE - Scientist, research (physical sciences) (Medical): No    Lack of Transportation (Non-Medical): No  Physical Activity: Not on file  Stress: Not on file  Social Connections: Not on file  Intimate Partner Violence: Not At Risk (09/07/2023)   Humiliation, Afraid, Rape, and Kick questionnaire    Fear of Current or Ex-Partner: No    Emotionally Abused: No    Physically Abused: No    Sexually Abused: No     Family History  Problem Relation Age of Onset   COPD Mother    Diabetes Father    CAD Father    Kidney cancer Brother     ROS: Otherwise negative unless mentioned in HPI  Physical Examination  Vitals:   09/07/23 0341 09/07/23 0719  BP: 126/69 112/74  Pulse: 74 71  Resp: 20 16  Temp: 98.3 F (36.8 C) 98.7 F (37.1 C)  SpO2: 94% 98%   Body mass index is 47.73 kg/m.  General:  WDWN in NAD Gait: Not observed HENT: WNL, normocephalic Pulmonary: normal non-labored breathing, without Rales, rhonchi,  wheezing Cardiac: regular, without  Murmurs, rubs or gallops; without carotid bruits Abdomen: Positive bowel sounds throughout, soft, NT/ND, no masses Skin: without rashes Vascular Exam/Pulses: Unable to palpate pulses in left lower extremity. Patient HX Right BKA.  Extremities: with ischemic changes, with Gangrene , with cellulitis; with open wounds;  Musculoskeletal: no muscle wasting or atrophy  Neurologic: A&O X 3;  No focal weakness or paresthesias are detected; speech is fluent/normal Psychiatric:  The pt has Normal affect. Lymph:  Unremarkable  CBC    Component Value Date/Time   WBC 11.2 (H) 09/07/2023 0412   RBC 3.84 (L) 09/07/2023 0412   HGB 8.0 (L) 09/07/2023 0412   HGB 12.6 (L) 06/13/2014 0421   HCT 26.5 (L) 09/07/2023 0412   HCT 39.0 (L) 06/13/2014 0421   PLT 241 09/07/2023 0412   PLT 190 06/13/2014 0421   MCV 69.0 (L) 09/07/2023 0412   MCV 80 06/13/2014 0421   MCH 20.8 (L) 09/07/2023 0412   MCHC 30.2 09/07/2023 0412   RDW 16.5 (H) 09/07/2023 0412   RDW 15.1 (H) 06/13/2014 0421    LYMPHSABS 0.7 09/06/2023 0648   LYMPHSABS 2.1 06/13/2014 0421   MONOABS 0.7 09/06/2023 9351  MONOABS 0.8 06/13/2014 0421   EOSABS 0.0 09/06/2023 0648   EOSABS 0.4 06/13/2014 0421   BASOSABS 0.0 09/06/2023 0648   BASOSABS 0.1 06/13/2014 0421    BMET    Component Value Date/Time   NA 132 (L) 09/07/2023 0412   NA 136 06/13/2014 0421   K 3.7 09/07/2023 0412   K 4.0 06/13/2014 0421   CL 98 09/07/2023 0412   CL 100 (L) 06/13/2014 0421   CO2 23 09/07/2023 0412   CO2 29 06/13/2014 0421   GLUCOSE 189 (H) 09/07/2023 0412   GLUCOSE 143 (H) 06/13/2014 0421   BUN 26 (H) 09/07/2023 0412   BUN 22 (H) 06/13/2014 0421   CREATININE 1.08 09/07/2023 0412   CREATININE 0.87 06/13/2014 0421   CALCIUM  8.1 (L) 09/07/2023 0412   CALCIUM  8.6 (L) 06/13/2014 0421   GFRNONAA >60 09/07/2023 0412   GFRNONAA >60 06/13/2014 0421   GFRAA >60 06/03/2018 0014   GFRAA >60 06/13/2014 0421    COAGS: Lab Results  Component Value Date   INR 1.4 (H) 09/06/2023   INR 1.4 (H) 09/06/2023   INR 1.1 02/27/2023     Non-Invasive Vascular Imaging:   EXAM:09/06/2023 MRI OF THE LEFT FOREFOOT WITHOUT AND WITH CONTRAST   TECHNIQUE: Multiplanar, multisequence MR imaging of the left forefoot was performed both before and after administration of intravenous contrast.   CONTRAST:  10mL GADAVIST  GADOBUTROL  1 MMOL/ML IV SOLN   COMPARISON:  Same day radiographs of the left foot dated 09/06/2023 at 7:16 a.m.   FINDINGS: Bones/Joint/Cartilage   Fracture of the base of the fifth metatarsal apophysis with erosive change along the lateral plantar margin. There is confluent T1 hypointense marrow signal abnormality about the fracture margins with mild T2/STIR hyperintensity and enhancement on postcontrast sequences. These findings are favored to reflect a subacute to chronic mildly displaced fracture of the fifth metatarsal base with osteomyelitis.   No convincing marrow signal abnormality identified elsewhere  to suggest osteomyelitis. Mild first MTP osteoarthritis. Moderate first through fifth TMT joint space narrowing. No joint effusion.   Ligaments   Collateral ligaments are intact.  Lisfranc ligament is intact.   Muscles and Tendons   Flexor and extensor compartment tendons are intact. Diffuse fatty atrophy of the intrinsic musculature of the foot likely reflects chronic denervation changes.   Soft tissue Soft tissue ulceration/wound extending along the lateral midfoot extends deep to the lateral plantar margin of the underlying fifth metatarsal base. There is surrounding soft tissue edema and cutaneous thickening with enhancement, consistent with cellulitis. No loculated fluid collection. Subcutaneous edema extending along the dorsal foot most pronounced laterally. A 4 mm nonenhancing T2 hyperintense cystic structure adjacent to the plantar aspect of the third digit flexor tendon at the level of the third PIP joint likely reflects a ganglion cyst (series 5, image 40).   IMPRESSION: 1. Soft tissue ulceration/wound extending along the lateral midfoot extends deep to the lateral plantar margin of the underlying fifth metatarsal base. There is a mildly displaced likely subacute to chronic fracture of the base of the fifth metatarsal with erosive change and marrow signal abnormality concerning for subacute to chronic osteomyelitis. 2. Soft tissue wound along lateral midfoot with surrounding edema and enhancement, consistent with cellulitis. No abscess. 3. Moderate first through fifth TMT joint space narrowing. 4. Diffuse fatty atrophy of the intrinsic musculature of the foot likely reflects chronic denervation changes.  Statin:  Yes.   Beta Blocker:  Yes.   Aspirin :  No. ACEI:  No. ARB:  No. CCB use:  No Other antiplatelets/anticoagulants:  No.    ASSESSMENT/PLAN: This is a 58 y.o. male who presents to [facility] emergency department with left lower extremity ulceration/wound  with infection.  Patient was seen by podiatry in the emergency department and after reviewing the MRI of his left lower extremity was deemed nonsalvageable.  Vascular surgery was consulted for amputation.  Vascular surgery plans on taking the patient to the operating room for left lower extremity above-the-knee amputation for left foot ulcer/wound with osteomyelitis.  I discussed at the bedside in detail with the patient the procedure, benefits, risks, complications.  Patient wishes to proceed as soon as possible.  I answered all the patient's questions today.  Patient was made n.p.o. midnight before the procedure.   -I discussed the case in detail with Dr. Selinda Gu MD and he agrees with the plan.   [name] [facility] 09/07/2023 7:33 AM

## 2023-09-07 NOTE — Discharge Instructions (Signed)

## 2023-09-07 NOTE — Op Note (Signed)
 [name]   OPERATIVE NOTE   PROCEDURE:  Left above-the-knee amputation  PRE-OPERATIVE DIAGNOSIS: Left foot osteomyelitis with ascending infection  POST-OPERATIVE DIAGNOSIS: same as above  SURGEON:  Selinda Gu, MD  ASSISTANT(S): none  ANESTHESIA: general  ESTIMATED BLOOD LOSS: 1500 cc  FINDING(S): Marked tissue edema present.  Significant venous hypertension also present.  SPECIMEN(S):  Left above-the-knee amputation  INDICATIONS:   Russell [name] is a 58 y.o. male who presents with left foot osteomyelitis with an ascending infection.  He is already lost the right leg and left arm and did not want to have any attempts at limb salvage.  The patient is scheduled for a left above-the-knee amputation.  I discussed in depth with the patient the risks, benefits, and alternatives to this procedure.  The patient is aware that the risk of this operation included but are not limited to:  bleeding, infection, myocardial infarction, stroke, death, failure to heal amputation wound, and possible need for more proximal amputation.  The patient is aware of the risks and agrees proceed forward with the procedure.  DESCRIPTION: After full informed written consent was obtained from the patient, the patient was taken to the operating room, and placed supine upon the operating table.  Prior to induction, the patient received IV antibiotics.  The patient was then prepped and draped in the standard fashion for a left above-the-knee amputation.  After obtaining adequate anesthesia, the patient was prepped and draped in the standard fashion for a above-the-knee amputation.  I marked out the anterior and posterior flaps for a fish-mouth type of amputation.  I made the incisions for these flaps, and then dissected through the subcutaneous tissue, fascia, and muscles circumferentially.  There was marked tissue edema and significant venous hypertension that was seen and encountered.  I  elevated  the periosteal tissue 4-5 cm more proximal than the anterior skin flap.  I then transected the femur with a power saw at this level.  The femoral artery and vein were then identified and ligated and divided with 2-0 silk suture ligature.  The dissection and closure were quite tedious due to the patient's extremely large size and marked edema.  Then I smoothed out the rough edges of the bone.  At this point, the specimen was passed off the field as the above-the-knee amputation.  At this point, I clamped all visibly bleeding arteries and veins using a combination of suture ligation with silk suture and electrocautery.   Bleeding continued to be controlled with electrocautery and suture ligature.  The stump was washed off with sterile normal saline and no further active bleeding was noted.  I reapproximated the anterior and posterior fascia  with interrupted stitches of 0 Vicryl.  This was completed along the entire length of anterior and posterior fascia until there were no more loose space in the fascial line. The subcutaneous tissue was then approximated with 2-0 vicryl sutures. The skin was then  reapproximated with staples.  The stump was washed off and dried.  The incision was dressed with Xeroform and ABD pads, and  then fluffs were applied.  Kerlix was wrapped around the leg and then gently an ACE wrap was applied.  A large Ioban was then placed over the ACE wrap to secure the dressing. The patient was then awakened and take to the recovery room in stable condition.   COMPLICATIONS: none  CONDITION: stable  Selinda Gu  09/07/2023, 3:37 PM   This note was created with  [employer] transcription system. Any errors in dictation are purely unintentional.

## 2023-09-07 NOTE — Plan of Care (Signed)
   Problem: Education: Goal: Knowledge of General Education information will improve Description Including pain rating scale, medication(s)/side effects and non-pharmacologic comfort measures Outcome: Progressing

## 2023-09-07 NOTE — H&P (View-Only) (Signed)
 Hospital Consult    Reason for Consult:  Left Lower extremity Infection with Osteomyelitis  Requesting Physician:  Dr Eva Gay MD  MRN #:  [id number]  History of Present Illness: This is a 58 y.o. male with PMH of decubitus ulcer of sacrum and left foot ulcer, left hand osteomyelitis S/P transradial amputation in 2/25, right BKA, PAD, history of GI bleed, history of a flutter, HTN, morbid obesity, OSA not on CPAP, depression, uncontrolled type II DM with neuropathy,chronic HFpEF, chronic back pain, presented to the hospital with foul-smelling wound infection on his left foot.  Patient was last seen on 07/24/2023 at [facility] ER for his left foot ulcer.  At that time he was prescribed pain medication and a referral was placed to podiatry as well as urology for urination difficulties.  He endorses he has not seen podiatry or urology since leaving the emergency department.  His main concern today is the 1 ulcer that he had in his foot which is now turning into multiple ulcers that are getting worse.  He acknowledges it has a foul smell today.  Patient does not know what his hemoglobin A1c is from workup.  In the emergency department the patient's blood sugar was 500.  He reports that he ran out of his insulin  because he has not followed up with his PCP and therefore was using [employer]'s short acting insulin .  He endorses he is used up to 80 units a day due to his inability to follow a diabetic diet.  On workup patient underwent an MRI of his left foot.  This reveals soft tissue ulceration and wound extending along the lateral midfoot that extends deep to the lateral plantar margins.  It also shows at the base of the fifth metatarsal erosive changes with marrow changes concerning for subacute to chronic osteomyelitis.  Vascular surgery was consulted by podiatry for further workup and either above-the-knee or below the knee amputation.  Past Medical History:  Diagnosis Date   Anxiety    CHF (congestive  heart failure) (HCC)    EF 30-35%   Chronic diastolic (congestive) heart failure (HCC)    Depression    Diabetes mellitus without complication (HCC)    Hyperlipemia    Hypertension    Morbid obesity (HCC)    Peripheral edema     Past Surgical History:  Procedure Laterality Date   APPENDECTOMY     BELOW KNEE LEG AMPUTATION     Right leg   BIOPSY  10/14/2022   Procedure: BIOPSY;  Surgeon: Aundria, Ladell POUR, MD;  Location: [facility] ENDOSCOPY;  Service: Gastroenterology;;   COLONOSCOPY N/A 10/16/2022   Procedure: COLONOSCOPY;  Surgeon: Therisa Bi, MD;  Location: [facility] ENDOSCOPY;  Service: Gastroenterology;  Laterality: N/A;   ESOPHAGOGASTRODUODENOSCOPY (EGD) WITH PROPOFOL  N/A 10/26/2022   Procedure: ESOPHAGOGASTRODUODENOSCOPY (EGD) WITH PROPOFOL ;  Surgeon: Maryruth Ole DASEN, MD;  Location: [facility] ENDOSCOPY;  Service: Endoscopy;  Laterality: N/A;   ESOPHAGOGASTRODUODENOSCOPY (EGD) WITH PROPOFOL  N/A 10/14/2022   Procedure: ESOPHAGOGASTRODUODENOSCOPY (EGD) WITH PROPOFOL ;  Surgeon: Toledo, Ladell POUR, MD;  Location: [facility] ENDOSCOPY;  Service: Gastroenterology;  Laterality: N/A;   INCISION AND DRAINAGE ABSCESS Left 02/27/2023   Procedure: INCISION AND DRAINAGE ABSCESS LEFT FINGER AND ARM;  Surgeon: Robbin Redell Ned, MD;  Location: [facility] ORS;  Service: Orthopedics;  Laterality: Left;   TONSILLECTOMY      Allergies  Allergen Reactions   Fluoxetine     Other reaction(s): Hallucination   Shellfish Allergy Hives   Sulfa Antibiotics Hives   Vancomycin   Other reaction(s): Red Man Syndrome   Metformin Nausea Only    Prior to Admission medications   Medication Sig Start Date End Date Taking? Authorizing Provider  acetaminophen  (TYLENOL ) 325 MG tablet Take 2 tablets (650 mg total) by mouth every 6 (six) hours as needed for mild pain (pain score 1-3) (or Fever >/= 101). 03/02/23  Yes Barbarann Nest, MD  aspirin  EC 81 MG tablet Take 81 mg by mouth.  Take 1 tablet (81 mg total) by mouth once daily  04/22/23 04/22/24 Yes [provider]  atorvastatin  (LIPITOR) 40 MG tablet Take 40 mg by mouth daily.    Yes [provider]  baclofen  (LIORESAL ) 10 MG tablet Take by mouth.  One tablet, oral, Every 6 Hours - PRN 06/30/23  Yes [provider]  cetirizine (ZYRTEC) 10 MG tablet Take 10 mg by mouth daily as needed for allergies. 06/23/20  Yes [provider]  diphenhydrAMINE  (BENADRYL ) 25 mg capsule Take 1 capsule (25 mg total) by mouth every 6 (six) hours as needed for allergies or itching (30 minutes prior to starting Vancomycin ). 03/02/23  Yes Barbarann Nest, MD  DULoxetine  (CYMBALTA ) 30 MG capsule Take 90 mg by mouth daily.   Yes [provider]  finasteride  (PROSCAR ) 5 MG tablet Take 5 mg by mouth daily. 08/17/23  Yes [provider]  furosemide  (LASIX ) 80 MG tablet Take 80 mg by mouth 2 (two) times daily. 12/08/22  Yes [provider]  HUMULIN  R 100 UNIT/ML injection Inject 0.1 mLs (10 Units total) into the skin 3 (three) times daily with meals. 11/16/22  Yes Awanda City, MD  Insulin  Aspart FlexPen (NOVOLOG ) 100 UNIT/ML Inject into the skin.  Inject subcutaneously Before meals and at bedtime, sliding scale 05/16/23  Yes [provider]  insulin  glargine-yfgn (SEMGLEE ) 100 UNIT/ML injection Inject 0.2 mLs (20 Units total) into the skin at bedtime. 03/02/23  Yes Barbarann Nest, MD  metoprolol  succinate (TOPROL -XL) 100 MG 24 hr tablet Take 100 mg by mouth daily. 12/08/22  Yes [provider]  Oxycodone  HCl 10 MG TABS Take by mouth.   Yes [provider]  polyethylene glycol (MIRALAX  / GLYCOLAX ) 17 g packet Take 17 g by mouth daily as needed. 11/16/22  Yes Awanda City, MD  potassium chloride  (MICRO-K ) 10 MEQ CR capsule Take by mouth 2 (two) times daily. :1 Tablet(s) By Mouth Twice Daily   Yes [provider]  pregabalin  (LYRICA ) 150 MG capsule Take 150 mg by mouth 3 (three) times daily. 08/15/23  Yes [provider]  senna-docusate (SENOKOT-S) 8.6-50 MG tablet Take 2 tablets by mouth daily as needed for mild constipation.   Yes [provider]  tamsulosin  (FLOMAX ) 0.4 MG CAPS capsule Take 0.8 mg by mouth daily.   Yes [provider]  buPROPion  (WELLBUTRIN  XL) 150 MG 24 hr tablet Take 1 tablet (150 mg total) by mouth daily. 11/06/22 12/06/22  Trudy Anthony HERO, MD  Insulin  Pen Needle 33G X 5 MM MISC 1 Dose by Does not apply route 2 (two) times daily. 10/19/22   Josette Ade, MD  melatonin 5 MG TABS Take 1 tablet (5 mg total) by mouth at bedtime. 11/16/22   Awanda City, MD  ofloxacin (OCUFLOX) 0.3 % ophthalmic solution  08/09/23   [provider]  pantoprazole  (PROTONIX ) 40 MG tablet Take 1 tablet (40 mg total) by mouth 2 (two) times daily. Patient not taking: Reported on 09/06/2023 11/08/22   Patel, Sona, MD    Social History  Socioeconomic History   Marital status: Legally Separated    Spouse name: Not on file   Number of children: Not on file   Years of education: Not on file   Highest education level: Not on file  Occupational History   Not on file  Tobacco Use   Smoking status: Never   Smokeless tobacco: Never  Vaping Use   Vaping status: Never Used  Substance and Sexual Activity   Alcohol use: No   Drug use: No   Sexual activity: Not Currently  Other Topics Concern   Not on file  Social History Narrative   Living with mother now   Wheelchair bound at baseline   Social Drivers of Health   Financial Resource Strain: Low Risk  (04/04/2023)   Received from [facility]   Overall Financial Resource Strain (CARDIA)    Difficulty of Paying Living Expenses: Not hard at all  Food Insecurity: Unknown (09/07/2023)   Hunger Vital Sign    Worried About Running Out of Food in the Last Year: Not on file    Ran Out of Food in the Last Year: Patient declined  Transportation Needs: No Transportation Needs (09/07/2023)   PRAPARE - Scientist, research (physical sciences) (Medical): No    Lack of Transportation (Non-Medical): No  Physical Activity: Not on file  Stress: Not on file  Social Connections: Not on file  Intimate Partner Violence: Not At Risk (09/07/2023)   Humiliation, Afraid, Rape, and Kick questionnaire    Fear of Current or Ex-Partner: No    Emotionally Abused: No    Physically Abused: No    Sexually Abused: No     Family History  Problem Relation Age of Onset   COPD Mother    Diabetes Father    CAD Father    Kidney cancer Brother     ROS: Otherwise negative unless mentioned in HPI  Physical Examination  Vitals:   09/07/23 0341 09/07/23 0719  BP: 126/69 112/74  Pulse: 74 71  Resp: 20 16  Temp: 98.3 F (36.8 C) 98.7 F (37.1 C)  SpO2: 94% 98%   Body mass index is 47.73 kg/m.  General:  WDWN in NAD Gait: Not observed HENT: WNL, normocephalic Pulmonary: normal non-labored breathing, without Rales, rhonchi,  wheezing Cardiac: regular, without  Murmurs, rubs or gallops; without carotid bruits Abdomen: Positive bowel sounds throughout, soft, NT/ND, no masses Skin: without rashes Vascular Exam/Pulses: Unable to palpate pulses in left lower extremity. Patient HX Right BKA.  Extremities: with ischemic changes, with Gangrene , with cellulitis; with open wounds;  Musculoskeletal: no muscle wasting or atrophy  Neurologic: A&O X 3;  No focal weakness or paresthesias are detected; speech is fluent/normal Psychiatric:  The pt has Normal affect. Lymph:  Unremarkable  CBC    Component Value Date/Time   WBC 11.2 (H) 09/07/2023 0412   RBC 3.84 (L) 09/07/2023 0412   HGB 8.0 (L) 09/07/2023 0412   HGB 12.6 (L) 06/13/2014 0421   HCT 26.5 (L) 09/07/2023 0412   HCT 39.0 (L) 06/13/2014 0421   PLT 241 09/07/2023 0412   PLT 190 06/13/2014 0421   MCV 69.0 (L) 09/07/2023 0412   MCV 80 06/13/2014 0421   MCH 20.8 (L) 09/07/2023 0412   MCHC 30.2 09/07/2023 0412   RDW 16.5 (H) 09/07/2023 0412   RDW 15.1 (H) 06/13/2014 0421    LYMPHSABS 0.7 09/06/2023 0648   LYMPHSABS 2.1 06/13/2014 0421   MONOABS 0.7 09/06/2023 9351  MONOABS 0.8 06/13/2014 0421   EOSABS 0.0 09/06/2023 0648   EOSABS 0.4 06/13/2014 0421   BASOSABS 0.0 09/06/2023 0648   BASOSABS 0.1 06/13/2014 0421    BMET    Component Value Date/Time   NA 132 (L) 09/07/2023 0412   NA 136 06/13/2014 0421   K 3.7 09/07/2023 0412   K 4.0 06/13/2014 0421   CL 98 09/07/2023 0412   CL 100 (L) 06/13/2014 0421   CO2 23 09/07/2023 0412   CO2 29 06/13/2014 0421   GLUCOSE 189 (H) 09/07/2023 0412   GLUCOSE 143 (H) 06/13/2014 0421   BUN 26 (H) 09/07/2023 0412   BUN 22 (H) 06/13/2014 0421   CREATININE 1.08 09/07/2023 0412   CREATININE 0.87 06/13/2014 0421   CALCIUM  8.1 (L) 09/07/2023 0412   CALCIUM  8.6 (L) 06/13/2014 0421   GFRNONAA >60 09/07/2023 0412   GFRNONAA >60 06/13/2014 0421   GFRAA >60 06/03/2018 0014   GFRAA >60 06/13/2014 0421    COAGS: Lab Results  Component Value Date   INR 1.4 (H) 09/06/2023   INR 1.4 (H) 09/06/2023   INR 1.1 02/27/2023     Non-Invasive Vascular Imaging:   EXAM:09/06/2023 MRI OF THE LEFT FOREFOOT WITHOUT AND WITH CONTRAST   TECHNIQUE: Multiplanar, multisequence MR imaging of the left forefoot was performed both before and after administration of intravenous contrast.   CONTRAST:  10mL GADAVIST  GADOBUTROL  1 MMOL/ML IV SOLN   COMPARISON:  Same day radiographs of the left foot dated 09/06/2023 at 7:16 a.m.   FINDINGS: Bones/Joint/Cartilage   Fracture of the base of the fifth metatarsal apophysis with erosive change along the lateral plantar margin. There is confluent T1 hypointense marrow signal abnormality about the fracture margins with mild T2/STIR hyperintensity and enhancement on postcontrast sequences. These findings are favored to reflect a subacute to chronic mildly displaced fracture of the fifth metatarsal base with osteomyelitis.   No convincing marrow signal abnormality identified elsewhere  to suggest osteomyelitis. Mild first MTP osteoarthritis. Moderate first through fifth TMT joint space narrowing. No joint effusion.   Ligaments   Collateral ligaments are intact.  Lisfranc ligament is intact.   Muscles and Tendons   Flexor and extensor compartment tendons are intact. Diffuse fatty atrophy of the intrinsic musculature of the foot likely reflects chronic denervation changes.   Soft tissue Soft tissue ulceration/wound extending along the lateral midfoot extends deep to the lateral plantar margin of the underlying fifth metatarsal base. There is surrounding soft tissue edema and cutaneous thickening with enhancement, consistent with cellulitis. No loculated fluid collection. Subcutaneous edema extending along the dorsal foot most pronounced laterally. A 4 mm nonenhancing T2 hyperintense cystic structure adjacent to the plantar aspect of the third digit flexor tendon at the level of the third PIP joint likely reflects a ganglion cyst (series 5, image 40).   IMPRESSION: 1. Soft tissue ulceration/wound extending along the lateral midfoot extends deep to the lateral plantar margin of the underlying fifth metatarsal base. There is a mildly displaced likely subacute to chronic fracture of the base of the fifth metatarsal with erosive change and marrow signal abnormality concerning for subacute to chronic osteomyelitis. 2. Soft tissue wound along lateral midfoot with surrounding edema and enhancement, consistent with cellulitis. No abscess. 3. Moderate first through fifth TMT joint space narrowing. 4. Diffuse fatty atrophy of the intrinsic musculature of the foot likely reflects chronic denervation changes.  Statin:  Yes.   Beta Blocker:  Yes.   Aspirin :  No. ACEI:  No. ARB:  No. CCB use:  No Other antiplatelets/anticoagulants:  No.    ASSESSMENT/PLAN: This is a 58 y.o. male who presents to [facility] emergency department with left lower extremity ulceration/wound  with infection.  Patient was seen by podiatry in the emergency department and after reviewing the MRI of his left lower extremity was deemed nonsalvageable.  Vascular surgery was consulted for amputation.  Vascular surgery plans on taking the patient to the operating room for left lower extremity above-the-knee amputation for left foot ulcer/wound with osteomyelitis.  I discussed at the bedside in detail with the patient the procedure, benefits, risks, complications.  Patient wishes to proceed as soon as possible.  I answered all the patient's questions today.  Patient was made n.p.o. midnight before the procedure.   -I discussed the case in detail with Dr. Selinda Gu MD and he agrees with the plan.   [name] [facility] 09/07/2023 7:33 AM

## 2023-09-07 NOTE — Anesthesia Preprocedure Evaluation (Signed)
 Anesthesia Evaluation  Patient identified by MRN, date of birth, ID band Patient awake    Reviewed: Allergy & Precautions, NPO status , Patient's Chart, lab work & pertinent test results  History of Anesthesia Complications Negative for: history of anesthetic complications  Airway Mallampati: III  TM Distance: >3 FB Neck ROM: full    Dental  (+) Poor Dentition, Dental Advidsory Given,    Pulmonary shortness of breath, sleep apnea , neg COPD, neg recent URI  2L [place]  Tachypnea         Cardiovascular Exercise Tolerance: Good hypertension, On Medications (-) angina + Peripheral Vascular Disease and +CHF (dCHF)  (-) Past MI and (-) Cardiac Stents + dysrhythmias (s/p cardioversion 2023) Atrial Fibrillation (-) Valvular Problems/Murmurs Rhythm:Regular Rate:Normal + Peripheral Edema    Neuro/Psych  PSYCHIATRIC DISORDERS Anxiety Depression     Neuromuscular disease (R sciatica)    GI/Hepatic Neg liver ROS, PUD,,,Lower GIB- Melena - resolved currently   Endo/Other  diabetes, Poorly Controlled, Type 2  Class 3 obesity  Renal/GU Renal diseaseAKI improved  negative genitourinary   Musculoskeletal   Abdominal  (+) + obese  Peds  Hematology  (+) Blood dyscrasia, anemia   Anesthesia Other Findings LEFT HAND INFECTION AND ABSCESS Sepsis is manifested by leukocytosis and tachypnea. S/p right BKA Decreased vision R>L  Past Medical History: No date: Anxiety No date: CHF (congestive heart failure) (HCC)     Comment:  EF 30-35% No date: Chronic diastolic (congestive) heart failure (HCC) No date: Depression No date: Diabetes mellitus without complication (HCC) No date: Hyperlipemia No date: Hypertension No date: Morbid obesity (HCC) No date: Peripheral edema  Past Surgical History: No date: APPENDECTOMY No date: BELOW KNEE LEG AMPUTATION     Comment:  Right leg No date: TONSILLECTOMY  BMI    Body Mass Index: 50.27 kg/m       Reproductive/Obstetrics negative OB ROS                              Anesthesia Physical Anesthesia Plan  ASA: 3  Anesthesia Plan: General   Post-op Pain Management: Ofirmev  IV (intra-op)* and Ketamine  IV*   Induction: Intravenous, Rapid sequence and Cricoid pressure planned  PONV Risk Score and Plan: 1 and Ondansetron , Dexamethasone , Midazolam  and Treatment may vary due to age or medical condition  Airway Management Planned: Oral ETT  Additional Equipment:   Intra-op Plan:   Post-operative Plan: Extubation in OR  Informed Consent: I have reviewed the patients History and Physical, chart, labs and discussed the procedure including the risks, benefits and alternatives for the proposed anesthesia with the patient or authorized representative who has indicated his/her understanding and acceptance.     Dental advisory given  Plan Discussed with: Anesthesiologist and CRNA  Anesthesia Plan Comments:         Anesthesia Quick Evaluation

## 2023-09-07 NOTE — Progress Notes (Signed)
 Correction to charting of blood administation and IV fluids/ IV meds.  Blood is infusing in 18 gauge right forearm.  Regular IV fluid & IV meds given in right arm antecubital site.

## 2023-09-07 NOTE — Interval H&P Note (Signed)
 History and Physical Interval Note:  09/07/2023 12:58 PM  Russell [name] [place]  has presented today for surgery, with the diagnosis of Osteomyelitis of left lower extremity.  The various methods of treatment have been discussed with the patient and family. After consideration of risks, benefits and other options for treatment, the patient has consented to  Procedure(s): AMPUTATION, ABOVE KNEE (Left) as a surgical intervention.  The patient's history has been reviewed, patient examined, no change in status, stable for surgery.  I have reviewed the patient's chart and labs.  Questions were answered to the patient's satisfaction.     Alistar Mcenery

## 2023-09-07 NOTE — Progress Notes (Signed)
 Initial Nutrition Assessment  DOCUMENTATION CODES:   Morbid obesity  INTERVENTION:   -Once diet is advanced add:   -MVI with minerals daily -500 mg vitamin C  BID -220 mg zinc  sulfate daily x 14 days -1 packet Juven BID, each packet provides 95 calories, 2.5 grams of protein (collagen), and 9.8 grams of carbohydrate (3 grams sugar); also contains 7 grams of L-arginine and L-glutamine, 300 mg vitamin C , 15 mg vitamin E, 1.2 mcg vitamin B-12, 9.5 mg zinc , 200 mg calcium , and 1.5 g  Calcium  Beta-hydroxy-Beta-methylbutyrate to support wound healing  -Ensure Max po daily, each supplement provides 150 kcal and 30 grams of protein.   -Double protein portions with meals -RD provided Plate Method and Carbohydrate Counting for People with Diabetes handout from [facility] Nutrition Care Manual; attached to AVS/ discharge summary  -RD will refer pt to 's Nutrition and Diabetes Education for further education and support at discharge  -RD will draw labs to assess for potential micronutrient deficiencies which may impede wound healing: vitamin A  NUTRITION DIAGNOSIS:   Increased nutrient needs related to post-op healing as evidenced by estimated needs.  GOAL:   Patient will meet greater than or equal to 90% of their needs  MONITOR:   PO intake, Supplement acceptance, Diet advancement  REASON FOR ASSESSMENT:   Consult Assessment of nutrition requirement/status, Wound healing  ASSESSMENT:   Pt with PMH of decubitus ulcer of sacrum and left foot ulcer, left hand osteomyelitis S/P transradial amputation in 2/25, right BKA, PAD, history of GI bleed, history of a flutter, HTN, morbid obesity, OSA not on CPAP, depression, uncontrolled type II DM with neuropathy,chronic HFpEF, chronic back pain, presented with foul-smelling wound infection on his left foot.  Pt admitted with sepsis secondary to cellulitis and acute osteomyelitis of lt foot with pathological fracture of fifth metatarsal.    Reviewed I/O's: -721 ml x 24 hours  UOP: 1.4 L x 24 hours  Per vascular surgery, plan for lt AKA today.   Pt unavailable at time of visit. Pt down in OR at times of visits. No family present to provide additional history. RD unable to obtain further nutrition-related history or complete nutrition-focused physical exam at this time.    Reviewed wt hx; pt has experienced a 9.6% wt loss over the past 6 months, which is not significant for time frame. It is concerning given increased nutritional needs for wound healing and uncontrolled DM. Suspect some wt loss may be related to uncontrolled DM.   Medications reviewed and include lovenox , protonix , and 0.9% sodium chloride  infusion @ 75 ml/hr.   Lab Results  Component Value Date   HGBA1C 10.1 (H) 09/06/2023   PTA DM medications are 20 units insulin  glargine-yfgn daily and 3 units insulin  humulin  TID. Per vascular surgery notes, pt has not been following up for PCP and has been using OTC insulin  in attempt to manage blood sugar PTA.   Labs reviewed: Na: 132, CBGS: 176-418  (inpatient orders for glycemic control are 0-20 units insulin  aspart every 4 hours and 10 units insulin  glargine-yfgn daily).    Diet Order:   Diet Order             Diet NPO time specified Except for: Ice Chips, Sips with Meds  Diet effective midnight                   EDUCATION NEEDS:   No education needs have been identified at this time  Skin:  Skin Assessment: Reviewed  RN Assessment  Last BM:  09/07/23 (type 3)  Height:   Ht Readings from Last 1 Encounters:  09/07/23 5' 11 (1.803 m)    Weight:   Wt Readings from Last 1 Encounters:  09/07/23 (!) 155.1 kg    Ideal Body Weight:  64.6 kg (adjusted for rt BKA and lt AKA)  BMI:  Body mass index is 47.7 kg/m.  Estimated Nutritional Needs:   Kcal:  1900-2100  Protein:  115-130 grams  Fluid:  1.9-2.1 L    Margery ORN, RD, LDN, CDCES Registered Dietitian III Certified Diabetes Care and  Education Specialist If unable to reach this RD, please use RD Inpatient group chat on secure chat between hours of 8am-4 pm daily

## 2023-09-07 NOTE — Transfer of Care (Signed)
 Immediate Anesthesia Transfer of Care Note  Patient: Russell [name]  Procedure(s) Performed: AMPUTATION, ABOVE KNEE (Left: Knee)  Patient Location: PACU  Anesthesia Type:General  Level of Consciousness: awake and drowsy  Airway & Oxygen Therapy: Patient Spontanous Breathing and Patient connected to face mask oxygen  Post-op Assessment: Report given to RN and Post -op Vital signs reviewed and stable  Post vital signs: Reviewed and stable  Last Vitals:  Vitals Value Taken Time  BP 109/51 09/07/23 15:17  Temp 36.4 C 09/07/23 15:15  Pulse 86 09/07/23 15:21  Resp 22 09/07/23 15:21  SpO2 98 % 09/07/23 15:21  Vitals shown include unfiled device data.  Last Pain:  Vitals:   09/07/23 1115  TempSrc:   PainSc: 8       Patients Stated Pain Goal: 0 (09/06/23 9366)  Complications: No notable events documented.

## 2023-09-07 NOTE — Progress Notes (Signed)
 PROGRESS NOTE    Russell [name]  FMW:[id number] DOB: [birth year] DOA: 09/06/2023 PCP: Eliverto [name], MD    Brief Narrative:   HPI: Patient with PMH of decubitus ulcer of sacrum and left foot ulcer, left hand osteomyelitis S/P transradial amputation in 2/25, right BKA, PAD, history of GI bleed, history of a flutter, HTN, morbid obesity, OSA not on CPAP, depression, uncontrolled type II DM with neuropathy,chronic HFpEF, chronic back pain, presented to the hospital with foul-smelling wound infection on his left foot. 5/25 was seen at [facility] ER for left foot ulcer.  Was prescribed pain medication.  Was referred to podiatry.  Also had difficulty with urination and was referred to urology. Since then the patient has been doing dressing changes.  Has not seen podiatry or urology.  At [facility] health. Presents with concern that the 1 ulcer that he had in his foot is now turning into multiple ulcers and they are getting worse.  Denies any fever or chills.  No nausea no vomiting.  No fall no trauma no injury reported recently. Had a fall 4 months ago since then he reports he has some chronic sacral pain and unable to sit up in the wheelchair and has been laying on his side. Has not seen PCP as well lately. Ran out of his insulin  and is only using [employer] brand short acting insulin . Reports dietary indiscretion with regards to his diabetes and therefore reports he has to occasionally use 80 units of insulin  a day. He reports that he has chronic neuropathy and does not feel any sensation in his legs below the knee.  If I break my leg, I will not feel it. He feels like he has some shortness of breath secondary to swelling. He does not use CPAP at nighttime.  Does not use oxygen at home. Denies any SI.     Assessment & Plan:   Principal Problem:   Acute osteomyelitis of metatarsal bone of left foot (HCC) Active Problems:   Sepsis due to cellulitis (HCC)   Dyslipidemia   Type 2 diabetes mellitus  with peripheral neuropathy (HCC)   Anxiety and depression   Morbid obesity (HCC)   PAD (peripheral artery disease) (HCC)   HLD (hyperlipidemia)   Hyperglycemia   Essential hypertension   OSA (obstructive sleep apnea)   Chronic respiratory failure with hypoxia (HCC)  Sepsis secondary to cellulitis and acute osteomyelitis of the left foot with pathological fracture of fifth metatarsal. Concern for soft tissue gas. Discussed with podiatry Dr. Ashley.  Appreciate consultation. Recommend MRI foot. CK mildly elevated.  655 ESR 92.  CRP pending.  Meeting SIRS criteria with leukocytosis and tachypnea with evidence of infection in her foot. Most likely will require amputation.  Patient is aware. Plan: Seen in consultation by vascular surgery.  Patient slated for AKA today  Sacral decubitus ulcer. Wound care consult. Continue foam dressing.   Uncontrolled type 2 diabetes mellitus with hyperglycemia, with diabetic neuropathy as well as diabetic foot infection. On Lyrica  at home.  Will continue although low threshold to reduce the dose given his sleep apnea..  Hemoglobin A1c greater than 10.  Poor control. Plan: Basal bolus regimen Carb modified diet when advanced  Morbid obesity. Body mass index is 44.63 kg/m.  Obesity hypoventilation syndrome. Noncompliant with CPAP. Suspected chronic respiratory failure in the setting of OSA. Placing the patient at high risk for poor outcome. Patient reports that he is losing weight on weight but reports diet indiscretion. Recommend outpatient follow-up with PCP.  Ordered CPAP however patient refused   HLD. On statin. Continue to hold.  Consider restarting 7/10   PAD. Here with aspirin .  Will continue   History of atrial flutter. History of GI bleed. No reoccurrence of atrial flutter per chart review. Had multiple admission with GI bleed therefore not on any anticoagulation. Continue to monitor   Depression. Patient is on Cymbalta ,  Wellbutrin . Continue Cymbalta    Chronic pain syndrome. Home regimen includes OxyContin  10 mg twice daily as well as oxycodone  as needed. Continue home regimen    DVT prophylaxis: On hold for the OR Code Status: Full Family Communication: None Disposition Plan: Status is: Inpatient Remains inpatient appropriate because: Gangrenous lower extremity.  Amputation.   Level of care: Progressive  Consultants:  Vascular surgery  Procedures:  AKA 7/9  Antimicrobials: Zosyn  Zyvox    Subjective: Seen and examined.  Sitting up on edge of bed.  Appears somewhat depressed but no pain complaints.  Objective: Vitals:   09/06/23 2330 09/07/23 0341 09/07/23 0719 09/07/23 1115  BP: 117/70 126/69 112/74 136/77  Pulse: 72 74 71 70  Resp: 16 20 16 20   Temp: 98.5 F (36.9 C) 98.3 F (36.8 C) 98.7 F (37.1 C) 97.8 F (36.6 C)  TempSrc:      SpO2: 94% 94% 98% 94%  Weight:    (!) 155.1 kg  Height:    5' 11 (1.803 m)    Intake/Output Summary (Last 24 hours) at 09/07/2023 1513 Last data filed at 09/07/2023 1509 Gross per 24 hour  Intake 2094.55 ml  Output 3600 ml  Net -1505.45 ml   Filed Weights   09/06/23 0633 09/06/23 1940 09/07/23 1115  Weight: (!) 145.2 kg (!) 155.2 kg (!) 155.1 kg    Examination:  General exam: NAD.  Appears depressed Respiratory system: Clear to auscultation. Respiratory effort normal. Cardiovascular system: S1 S2, RRR, no murmurs Gastrointestinal system: Obese, Soft, NT/ND, normal bowel sounds Central nervous system: Alert and oriented. No focal neurological deficits. Extremities: Right upper extremity amputation, right lower extremity BKA, erythematous left lower extremity up to level of mid thigh Skin: No rashes, lesions or ulcers Psychiatry: Judgement and insight appear normal. Mood & affect appropriate.     Data Reviewed: I have personally reviewed following labs and imaging studies  CBC: Recent Labs  Lab 09/06/23 0648 09/07/23 0412  WBC  14.6* 11.2*  NEUTROABS 13.0*  --   HGB 9.0* 8.0*  HCT 29.8* 26.5*  MCV 69.1* 69.0*  PLT 260 241   Basic Metabolic Panel: Recent Labs  Lab 09/06/23 0648 09/06/23 1018 09/06/23 2016 09/07/23 0412  NA 129* 130* 129* 132*  K 4.6 4.3 3.9 3.7  CL 96* 98 97* 98  CO2 25 23 23 23   GLUCOSE 540* 506* 415* 189*  BUN 24* 24* 26* 26*  CREATININE 1.12 1.10 1.00 1.08  CALCIUM  8.4* 8.0* 8.1* 8.1*   GFR: Estimated Creatinine Clearance: 111.7 mL/min (by C-G formula based on SCr of 1.08 mg/dL). Liver Function Tests: Recent Labs  Lab 09/06/23 0648  AST 36  ALT 18  ALKPHOS 112  BILITOT 0.5  PROT 7.5  ALBUMIN  2.4*   No results for input(s): LIPASE, AMYLASE in the last 168 hours. No results for input(s): AMMONIA in the last 168 hours. Coagulation Profile: Recent Labs  Lab 09/06/23 0648 09/06/23 1020  INR 1.4* 1.4*   Cardiac Enzymes: Recent Labs  Lab 09/06/23 1020  CKTOTAL 655*   BNP (last 3 results) No results for input(s): PROBNP in the last  8760 hours. HbA1C: Recent Labs    09/06/23 1020  HGBA1C 10.1*   CBG: Recent Labs  Lab 09/06/23 1953 09/06/23 2331 09/07/23 0342 09/07/23 0822 09/07/23 1117  GLUCAP 418* 265* 177* 176* 208*   Lipid Profile: No results for input(s): CHOL, HDL, LDLCALC, TRIG, CHOLHDL, LDLDIRECT in the last 72 hours. Thyroid  Function Tests: No results for input(s): TSH, T4TOTAL, FREET4, T3FREE, THYROIDAB in the last 72 hours. Anemia Panel: No results for input(s): VITAMINB12, FOLATE, FERRITIN, TIBC, IRON , RETICCTPCT in the last 72 hours. Sepsis Labs: Recent Labs  Lab 09/06/23 0648  PROCALCITON 3.40  LATICACIDVEN 1.8    Recent Results (from the past 240 hours)  Culture, blood (Routine x 2)     Status: None (Preliminary result)   Collection Time: 09/06/23  6:48 AM   Specimen: BLOOD RIGHT ARM  Result Value Ref Range Status   Specimen Description   Final    BLOOD RIGHT ARM Performed at [facility], [address] [place], [place] [address]    Special Requests   Final    BOTTLES DRAWN AEROBIC AND ANAEROBIC Blood Culture adequate volume Performed at [facility], [address], [place], [place] [address]    Culture   Final    NO GROWTH 1 DAY Performed at [facility], [address], [place], [place] [address]    Report Status PENDING  Incomplete  Culture, blood (Routine x 2)     Status: None (Preliminary result)   Collection Time: 09/06/23  9:16 AM   Specimen: BLOOD  Result Value Ref Range Status   Specimen Description BLOOD BLOOD RIGHT WRIST  Final   Special Requests   Final    BOTTLES DRAWN AEROBIC AND ANAEROBIC Blood Culture adequate volume   Culture   Final    NO GROWTH < 24 HOURS Performed at [facility], [address] [place], [place] [address]    Report Status PENDING  Incomplete  Aerobic Culture w Gram Stain (superficial specimen)     Status: None (Preliminary result)   Collection Time: 09/06/23 10:18 AM   Specimen: Foot; Wound  Result Value Ref Range Status   Specimen Description   Final    FOOT Performed at [facility], [address], [place], [place] [address]    Special Requests   Final    LT FOOT Performed at [facility], [address] [place], [place] [address]    Gram Stain   Final    NO WBC SEEN RARE GRAM NEGATIVE RODS FEW GRAM POSITIVE COCCI IN PAIRS    Culture   Final    ABUNDANT PROTEUS MIRABILIS CULTURE REINCUBATED FOR BETTER GROWTH SUSCEPTIBILITIES TO FOLLOW Performed at [facility], [address], [place], [place] [address]    Report Status PENDING  Incomplete  MRSA Next Gen by PCR, Nasal     Status: Abnormal   Collection Time: 09/07/23  4:41 AM   Specimen: Nasal Mucosa; Nasal Swab  Result Value Ref Range Status   MRSA by PCR Next Gen DETECTED (A) NOT DETECTED Final    Comment: RESULT CALLED TO, READ BACK BY AND VERIFIED WITH: ALLEAN BROCKS RN [phone] 09/07/23  HNM (NOTE) The GeneXpert MRSA Assay (FDA approved for NASAL specimens only), is one component of a comprehensive MRSA colonization surveillance program. It is not intended to diagnose MRSA infection nor to guide or monitor treatment for MRSA infections. Test performance is not FDA approved in patients less than 2 years  old. Performed at [facility], [address] [place], [place] [address]          Radiology Studies: MR FOOT LEFT W WO CONTRAST Result Date: 09/06/2023 CLINICAL DATA:  Diabetic foot wound. EXAM: MRI OF THE LEFT FOREFOOT WITHOUT AND WITH CONTRAST TECHNIQUE: Multiplanar, multisequence MR imaging of the left forefoot was performed both before and after administration of intravenous contrast. CONTRAST:  10mL GADAVIST  GADOBUTROL  1 MMOL/ML IV SOLN COMPARISON:  Same day radiographs of the left foot dated 09/06/2023 at 7:16 a.m. FINDINGS: Bones/Joint/Cartilage Fracture of the base of the fifth metatarsal apophysis with erosive change along the lateral plantar margin. There is confluent T1 hypointense marrow signal abnormality about the fracture margins with mild T2/STIR hyperintensity and enhancement on postcontrast sequences. These findings are favored to reflect a subacute to chronic mildly displaced fracture of the fifth metatarsal base with osteomyelitis. No convincing marrow signal abnormality identified elsewhere to suggest osteomyelitis. Mild first MTP osteoarthritis. Moderate first through fifth TMT joint space narrowing. No joint effusion. Ligaments Collateral ligaments are intact.  Lisfranc ligament is intact. Muscles and Tendons Flexor and extensor compartment tendons are intact. Diffuse fatty atrophy of the intrinsic musculature of the foot likely reflects chronic denervation changes. Soft tissue Soft tissue ulceration/wound extending along the lateral midfoot extends deep to the lateral plantar margin of the underlying fifth metatarsal base. There is surrounding soft  tissue edema and cutaneous thickening with enhancement, consistent with cellulitis. No loculated fluid collection. Subcutaneous edema extending along the dorsal foot most pronounced laterally. A 4 mm nonenhancing T2 hyperintense cystic structure adjacent to the plantar aspect of the third digit flexor tendon at the level of the third PIP joint likely reflects a ganglion cyst (series 5, image 40). IMPRESSION: 1. Soft tissue ulceration/wound extending along the lateral midfoot extends deep to the lateral plantar margin of the underlying fifth metatarsal base. There is a mildly displaced likely subacute to chronic fracture of the base of the fifth metatarsal with erosive change and marrow signal abnormality concerning for subacute to chronic osteomyelitis. 2. Soft tissue wound along lateral midfoot with surrounding edema and enhancement, consistent with cellulitis. No abscess. 3. Moderate first through fifth TMT joint space narrowing. 4. Diffuse fatty atrophy of the intrinsic musculature of the foot likely reflects chronic denervation changes. Electronically Signed   By: Harrietta Sherry M.D.   On: 09/06/2023 13:38   DG Chest Portable 1 View Result Date: 09/06/2023 EXAM: 1 VIEW XRAY OF THE CHEST 09/06/2023 07:16:34 AM COMPARISON: 10/23/2022 CLINICAL HISTORY: Shortness of breath, leg swelling, and wounds on legs. Patient unable to rotate or move leg. FINDINGS: LUNGS AND PLEURA: Mild dependent atelectasis is present at the left base. No airspace consolidation is present. No pleural effusion. No pneumothorax. HEART AND MEDIASTINUM: No acute abnormality of the cardiac and mediastinal silhouettes. BONES AND SOFT TISSUES: No acute osseous abnormality. IMPRESSION: 1. No acute findings. 2. Mild dependent atelectasis at the left base. Electronically signed by: Lonni Necessary MD 09/06/2023 07:42 AM EDT RP Workstation: [id number]   DG Foot Complete Left Result Date: 09/06/2023 EXAM: 3 or more VIEW(S) XRAY OF THE LEFT  FOOT 09/06/2023 07:16:34 AM COMPARISON: None available. CLINICAL HISTORY: Shortness of breath, leg swelling, wounds on legs, and inability to rotate or move leg. FINDINGS: BONES AND JOINTS: A remote healed distal fibular fracture is present. Erosive changes are present at the base of the fifth metatarsal. No other acute osseous changes are present. SOFT TISSUES: Extensive soft tissue swelling is present about the  foot. Ulceration and soft tissue gas was present over the lateral and plantar surface of the foot. IMPRESSION: 1. Extensive soft tissue swelling with ulceration and soft tissue gas over the lateral and plantar surface of the foot. 2. Erosive changes at the base of the fifth metatarsal are concerning for osteomyelitis. 3. Remote healed distal fibular fracture. Electronically signed by: Lonni Necessary MD 09/06/2023 07:40 AM EDT RP Workstation: [id number]        Scheduled Meds:  [START ON 09/08/2023] vitamin C   500 mg Oral BID   chlorhexidine   60 mL Topical Once   And   [START ON 09/08/2023] chlorhexidine   60 mL Topical Once   [MAR Hold] Chlorhexidine  Gluconate Cloth  6 each Topical Daily   [MAR Hold] DULoxetine   90 mg Oral Daily   [MAR Hold] enoxaparin  (LOVENOX ) injection  70 mg Subcutaneous Q24H   [MAR Hold] insulin  aspart  0-20 Units Subcutaneous Q4H   [MAR Hold] insulin  glargine-yfgn  10 Units Subcutaneous QHS   [START ON 09/08/2023] multivitamin with minerals  1 tablet Oral Daily   [MAR Hold] mupirocin  ointment  1 Application Nasal BID   [START ON 09/08/2023] nutrition supplement (JUVEN)  1 packet Oral BID BM   [MAR Hold] pantoprazole   40 mg Oral BID   [MAR Hold] pregabalin   150 mg Oral TID   [START ON 09/08/2023] Ensure Max Protein  11 oz Oral QHS   [MAR Hold] tamsulosin   0.4 mg Oral QPC supper   [START ON 09/08/2023] zinc  sulfate (50mg  elemental zinc )  220 mg Oral Daily   Continuous Infusions:  sodium chloride      [MAR Hold] linezolid  (ZYVOX ) IV 600 mg (09/07/23 0851)   [MAR  Hold] piperacillin -tazobactam (ZOSYN )  IV 3.375 g (09/07/23 0511)     LOS: 1 day     Calvin KATHEE Robson, MD [facility]   If 7PM-7AM, please contact night-coverage  09/07/2023, 3:13 PM

## 2023-09-07 NOTE — Inpatient Diabetes Management (Signed)
 Inpatient Diabetes Program Recommendations  AACE/ADA: New Consensus Statement on Inpatient Glycemic Control (2015)  Target Ranges:  Prepandial:   less than 140 mg/dL      Peak postprandial:   less than 180 mg/dL (1-2 hours)      Critically ill patients:  140 - 180 mg/dL   Lab Results  Component Value Date   GLUCAP 176 (H) 09/07/2023   HGBA1C 10.1 (H) 09/06/2023    Review of Glycemic Control  Latest Reference Range & Units 09/06/23 12:31 09/06/23 19:53 09/06/23 23:31 09/07/23 03:42 09/07/23 08:22  Glucose-Capillary 70 - 99 mg/dL 519 (H) 581 (H) 734 (H) 177 (H) 176 (H)   Diabetes history: DM 2 Outpatient Diabetes medications:  Novolog  tid with meals Semglee  20 units q HS Current orders for Inpatient glycemic control:  Semglee  10 units daily Novolog  0-20 units q 4 hours  Inpatient Diabetes Program Recommendations:    Patient is currently NPO. A1C is >10% but is improved from 2024.  Will continue to follow.   Thanks,  Randall Bullocks, RN, BC-ADM Inpatient Diabetes Coordinator Pager [phone]  (8a-5p)

## 2023-09-08 ENCOUNTER — Telehealth (HOSPITAL_COMMUNITY): Payer: Self-pay

## 2023-09-08 ENCOUNTER — Encounter: Payer: Self-pay | Admitting: Vascular Surgery

## 2023-09-08 ENCOUNTER — Other Ambulatory Visit (HOSPITAL_COMMUNITY): Payer: Self-pay

## 2023-09-08 DIAGNOSIS — M86172 Other acute osteomyelitis, left ankle and foot: Secondary | ICD-10-CM | POA: Diagnosis not present

## 2023-09-08 LAB — CBC
HCT: 24.2 % — ABNORMAL LOW (ref 39.0–52.0)
Hemoglobin: 7.6 g/dL — ABNORMAL LOW (ref 13.0–17.0)
MCH: 22.9 pg — ABNORMAL LOW (ref 26.0–34.0)
MCHC: 31.4 g/dL (ref 30.0–36.0)
MCV: 72.9 fL — ABNORMAL LOW (ref 80.0–100.0)
Platelets: 258 K/uL (ref 150–400)
RBC: 3.32 MIL/uL — ABNORMAL LOW (ref 4.22–5.81)
RDW: 18.6 % — ABNORMAL HIGH (ref 11.5–15.5)
WBC: 12.1 K/uL — ABNORMAL HIGH (ref 4.0–10.5)
nRBC: 0.2 % (ref 0.0–0.2)

## 2023-09-08 LAB — BPAM RBC
Blood Product Expiration Date: 202508092359
Blood Product Expiration Date: 202508092359
ISSUE DATE / TIME: 202507091429
ISSUE DATE / TIME: 202507091429
Unit Type and Rh: 5100
Unit Type and Rh: 5100

## 2023-09-08 LAB — GLUCOSE, CAPILLARY
Glucose-Capillary: 130 mg/dL — ABNORMAL HIGH (ref 70–99)
Glucose-Capillary: 201 mg/dL — ABNORMAL HIGH (ref 70–99)
Glucose-Capillary: 295 mg/dL — ABNORMAL HIGH (ref 70–99)
Glucose-Capillary: 322 mg/dL — ABNORMAL HIGH (ref 70–99)
Glucose-Capillary: 335 mg/dL — ABNORMAL HIGH (ref 70–99)
Glucose-Capillary: 339 mg/dL — ABNORMAL HIGH (ref 70–99)
Glucose-Capillary: 364 mg/dL — ABNORMAL HIGH (ref 70–99)

## 2023-09-08 LAB — TYPE AND SCREEN
ABO/RH(D): O NEG
Antibody Screen: NEGATIVE
Unit division: 0
Unit division: 0

## 2023-09-08 LAB — BASIC METABOLIC PANEL WITH GFR
Anion gap: 7 (ref 5–15)
BUN: 29 mg/dL — ABNORMAL HIGH (ref 6–20)
CO2: 24 mmol/L (ref 22–32)
Calcium: 7.5 mg/dL — ABNORMAL LOW (ref 8.9–10.3)
Chloride: 99 mmol/L (ref 98–111)
Creatinine, Ser: 1.21 mg/dL (ref 0.61–1.24)
GFR, Estimated: >60 mL/min (ref >60)
Glucose, Bld: 362 mg/dL — ABNORMAL HIGH (ref 70–99)
Potassium: 4.7 mmol/L (ref 3.5–5.1)
Sodium: 130 mmol/L — ABNORMAL LOW (ref 135–145)

## 2023-09-08 LAB — PREPARE RBC (CROSSMATCH)

## 2023-09-08 MED ORDER — BACLOFEN 10 MG PO TABS
10.0000 mg | ORAL_TABLET | Freq: Four times a day (QID) | ORAL | Status: DC | PRN
  Administered 2023-09-08 – 2023-09-11 (×2): 10 mg via ORAL
  Filled 2023-09-08 (×2): qty 1

## 2023-09-08 MED ORDER — INSULIN ASPART 100 UNIT/ML IJ SOLN
5.0000 [IU] | Freq: Three times a day (TID) | INTRAMUSCULAR | Status: DC
  Administered 2023-09-08 – 2023-09-12 (×11): 5 [IU] via SUBCUTANEOUS
  Filled 2023-09-08 (×13): qty 1

## 2023-09-08 MED ORDER — FINASTERIDE 5 MG PO TABS
5.0000 mg | ORAL_TABLET | Freq: Every day | ORAL | Status: DC
  Administered 2023-09-08 – 2023-09-13 (×6): 5 mg via ORAL
  Filled 2023-09-08 (×6): qty 1

## 2023-09-08 MED ORDER — ATORVASTATIN CALCIUM 20 MG PO TABS
40.0000 mg | ORAL_TABLET | Freq: Every day | ORAL | Status: DC
  Administered 2023-09-08 – 2023-09-13 (×6): 40 mg via ORAL
  Filled 2023-09-08 (×6): qty 2

## 2023-09-08 MED ORDER — METHOCARBAMOL 500 MG PO TABS
750.0000 mg | ORAL_TABLET | Freq: Four times a day (QID) | ORAL | Status: DC
  Administered 2023-09-08 – 2023-09-12 (×13): 750 mg via ORAL
  Filled 2023-09-08 (×15): qty 2

## 2023-09-08 MED ORDER — INSULIN GLARGINE-YFGN 100 UNIT/ML ~~LOC~~ SOLN
17.0000 [IU] | Freq: Every day | SUBCUTANEOUS | Status: DC
  Filled 2023-09-08: qty 0.17

## 2023-09-08 MED ORDER — ASPIRIN 81 MG PO TBEC
81.0000 mg | DELAYED_RELEASE_TABLET | Freq: Every day | ORAL | Status: DC
  Administered 2023-09-08 – 2023-09-13 (×6): 81 mg via ORAL
  Filled 2023-09-08 (×6): qty 1

## 2023-09-08 MED ORDER — SODIUM CHLORIDE 0.9 % IV SOLN
3.0000 g | Freq: Four times a day (QID) | INTRAVENOUS | Status: DC
  Administered 2023-09-08 – 2023-09-13 (×19): 3 g via INTRAVENOUS
  Filled 2023-09-08 (×22): qty 8

## 2023-09-08 MED ORDER — INSULIN GLARGINE-YFGN 100 UNIT/ML ~~LOC~~ SOLN
20.0000 [IU] | Freq: Every day | SUBCUTANEOUS | Status: DC
  Administered 2023-09-08 – 2023-09-12 (×5): 20 [IU] via SUBCUTANEOUS
  Filled 2023-09-08 (×6): qty 0.2

## 2023-09-08 NOTE — Telephone Encounter (Signed)
 Pharmacy Patient Advocate Encounter  Insurance verification completed.    The patient is insured through [facility]. Patient has Medicare and is not eligible for a copay card, but may be able to apply for patient assistance or Medicare RX Payment Plan (Patient Must reach out to their plan, if eligible for payment plan), if available.    Ran test claim for Dexcom G7 and the current 30 day co-pay is $0.00.  Ran test claim for Lantus  pen and the current 30 day co-pay is $0.00.  Ran test claim for Humalog Kwikpen and the current 30 day co-pay is $0.00.  This test claim was processed through [facility]- copay amounts may vary at other pharmacies due to pharmacy/plan contracts, or as the patient moves through the different stages of their insurance plan.

## 2023-09-08 NOTE — Progress Notes (Signed)
 Nutrition Follow-up  DOCUMENTATION CODES:   Morbid obesity  INTERVENTION:   -Continue MVI with minerals daily -Continue 500 mg vitamin C  BID -220 mg zinc  sulfate daily x 14 days -Continue 1 packet Juven BID, each packet provides 95 calories, 2.5 grams of protein (collagen), and 9.8 grams of carbohydrate (3 grams sugar); also contains 7 grams of L-arginine and L-glutamine, 300 mg vitamin C , 15 mg vitamin E, 1.2 mcg vitamin B-12, 9.5 mg zinc , 200 mg calcium , and 1.5 g  Calcium  Beta-hydroxy-Beta-methylbutyrate to support wound healing  -Contiue Ensure Max po daily, each supplement provides 150 kcal and 30 grams of protein.   -Continue double protein portions with meals -RD will draw labs to assess for potential micronutrient deficiencies which may impede wound healing: vitamin A  NUTRITION DIAGNOSIS:   Increased nutrient needs related to post-op healing as evidenced by estimated needs.  Ongoing  GOAL:   Patient will meet greater than or equal to 90% of their needs  Progressing   MONITOR:   PO intake, Supplement acceptance, Diet advancement  REASON FOR ASSESSMENT:   Consult Assessment of nutrition requirement/status, Wound healing  ASSESSMENT:   Pt with PMH of decubitus ulcer of sacrum and left foot ulcer, left hand osteomyelitis S/P transradial amputation in 2/25, right BKA, PAD, history of GI bleed, history of a flutter, HTN, morbid obesity, OSA not on CPAP, depression, uncontrolled type II DM with neuropathy,chronic HFpEF, chronic back pain, presented with foul-smelling wound infection on his left foot.  7/- s/p lt AKA  Reviewed I/O's: +5 ml x 24 hours and -715 ml since admission  UOP: 1.2 L x 24 hours  Pt receiving nursing care at time of visit.   Pt receiving nursing care at time of visit.   Pt on a carb modified diet. Noted meal completions 50%.   Reviewed wt hx; pt has experienced a 9.6% wt loss over the past 6 months, which is not significant for time frame. It  is concerning given increased nutritional needs for wound healing and uncontrolled DM. Suspect some wt loss may be related to uncontrolled DM.   Medications reviewed and include lovenox  and protonix .   Labs reviewed: Na: 130, CBGS: 295-374 (inpatient orders for glycemic control are 0-20 units insulin  aspart every 4 hours, 5 units insulin  aspart TID with meals, and 17 units insulin  glargine-yfgn daily).  Vitamin A pending. Noted DM coordinator recommendation to increase to 25 units of semglee  and add 5 units novolog  meal coverage.   NUTRITION - FOCUSED PHYSICAL EXAM:  Flowsheet Row Most Recent Value  Orbital Region No depletion  Upper Arm Region No depletion  Thoracic and Lumbar Region No depletion  Buccal Region No depletion  Temple Region No depletion  Clavicle Bone Region No depletion  Clavicle and Acromion Bone Region No depletion  Scapular Bone Region No depletion  Dorsal Hand No depletion  Patellar Region Unable to assess  Anterior Thigh Region Unable to assess  Posterior Calf Region No depletion  Edema (RD Assessment) Moderate  Hair Reviewed  Eyes Reviewed  Mouth Reviewed  Skin Reviewed  Nails Reviewed     Diet Order:   Diet Order             Diet Carb Modified Fluid consistency: Thin; Room service appropriate? Yes  Diet effective now                   EDUCATION NEEDS:   No education needs have been identified at this time  Skin:  Skin  Assessment: Skin Integrity Issues: Skin Integrity Issues:: Incisions Incisions: closed lt leg  Last BM:  09/07/23 (type 3)  Height:   Ht Readings from Last 1 Encounters:  09/07/23 5' 11 (1.803 m)    Weight:   Wt Readings from Last 1 Encounters:  09/07/23 (!) 155.1 kg    Ideal Body Weight:  64.6 kg (adjusted for rt BKA and lt AKA)  BMI:  Body mass index is 47.7 kg/m.  Estimated Nutritional Needs:   Kcal:  1900-2100  Protein:  115-130 grams  Fluid:  1.9-2.1 L    Margery ORN, RD, LDN, CDCES Registered  Dietitian III Certified Diabetes Care and Education Specialist If unable to reach this RD, please use RD Inpatient group chat on secure chat between hours of 8am-4 pm daily

## 2023-09-08 NOTE — Inpatient Diabetes Management (Addendum)
 Inpatient Diabetes Program Recommendations  AACE/ADA: New Consensus Statement on Inpatient Glycemic Control (2015)  Target Ranges:  Prepandial:   less than 140 mg/dL      Peak postprandial:   less than 180 mg/dL (1-2 hours)      Critically ill patients:  140 - 180 mg/dL   Lab Results  Component Value Date   GLUCAP 295 (H) 09/08/2023   HGBA1C 10.1 (H) 09/06/2023    Review of Glycemic Control  Latest Reference Range & Units 09/07/23 17:33 09/07/23 20:56 09/08/23 00:08 09/08/23 04:16 09/08/23 07:45  Glucose-Capillary 70 - 99 mg/dL 677 (H) 625 (H) 664 (H) 322 (H) 295 (H)   Diabetes history: DM 2 Outpatient Diabetes medications:  Semglee  20 units daily Novolog  prn  Current orders for Inpatient glycemic control:  Novolog  0-20 units q 4 hours Semglee  17 units daily  Inpatient Diabetes Program Recommendations:    A1C indicates poor control of DM.  Consider further increase of Semglee  to 25 units daily and add Novolog  meal coverage 5 units tid with meals (hold if patient eats less than 50% or NPO).   Addendum:  Spoke to patient regarding home glycemic control. He states that he stopped taking long acting insulin  and was only taking Regular insulin  at home prior to admit.  Note patient has new left leg amputation and also right BKA.  He also has left upper extremity amputation.  He states that he has a difficult time with checking blood sugars at home with his meter due to having only one hand.  Requested benefits check for CGM for discharge.  Also patient will need new prescriptions for both long acting and short acting insulins at discharge.  He states that he can use insulin  pen and prefers these for insulin  administration.   Thanks,   Randall Bullocks, RN, BC-ADM Inpatient Diabetes Coordinator Pager [phone]  (8a-5p)

## 2023-09-08 NOTE — Care Management Important Message (Signed)
 Important Message  Patient Details  Name: Russell [name] MRN: [id number] Date of Birth: 1965-06-25   Important Message Given:  Yes - Medicare IM     [name] 09/08/2023, 2:11 PM

## 2023-09-08 NOTE — Plan of Care (Signed)
  Problem: Skin Integrity: Goal: Risk for impaired skin integrity will decrease Outcome: Progressing   Problem: Education: Goal: Knowledge of General Education information will improve Description: Including pain rating scale, medication(s)/side effects and non-pharmacologic comfort measures Outcome: Progressing   Problem: Activity: Goal: Risk for activity intolerance will decrease Outcome: Progressing   Problem: Nutrition: Goal: Adequate nutrition will be maintained Outcome: Progressing   Problem: Pain Managment: Goal: General experience of comfort will improve and/or be controlled Outcome: Progressing   Problem: Safety: Goal: Ability to remain free from injury will improve Outcome: Progressing   Problem: Skin Integrity: Goal: Risk for impaired skin integrity will decrease Outcome: Progressing

## 2023-09-08 NOTE — Progress Notes (Signed)
 PROGRESS NOTE    Russell [name]  FMW:[id number] DOB: [birth year] DOA: 09/06/2023 PCP: Eliverto [name], MD    Brief Narrative:   HPI: Patient with PMH of decubitus ulcer of sacrum and left foot ulcer, left hand osteomyelitis S/P transradial amputation in 2/25, right BKA, PAD, history of GI bleed, history of a flutter, HTN, morbid obesity, OSA not on CPAP, depression, uncontrolled type II DM with neuropathy,chronic HFpEF, chronic back pain, presented to the hospital with foul-smelling wound infection on his left foot. 5/25 was seen at [facility] ER for left foot ulcer.  Was prescribed pain medication.  Was referred to podiatry.  Also had difficulty with urination and was referred to urology. Since then the patient has been doing dressing changes.  Has not seen podiatry or urology.  At [facility] health. Presents with concern that the 1 ulcer that he had in his foot is now turning into multiple ulcers and they are getting worse.  Denies any fever or chills.  No nausea no vomiting.  No fall no trauma no injury reported recently. Had a fall 4 months ago since then he reports he has some chronic sacral pain and unable to sit up in the wheelchair and has been laying on his side. Has not seen PCP as well lately. Ran out of his insulin  and is only using [employer] brand short acting insulin . Reports dietary indiscretion with regards to his diabetes and therefore reports he has to occasionally use 80 units of insulin  a day. He reports that he has chronic neuropathy and does not feel any sensation in his legs below the knee.  If I break my leg, I will not feel it. He feels like he has some shortness of breath secondary to swelling. He does not use CPAP at nighttime.  Does not use oxygen at home. Denies any SI.     Assessment & Plan:   Principal Problem:   Acute osteomyelitis of metatarsal bone of left foot (HCC) Active Problems:   Sepsis due to cellulitis (HCC)   Dyslipidemia   Type 2 diabetes mellitus  with peripheral neuropathy (HCC)   Anxiety and depression   Morbid obesity (HCC)   PAD (peripheral artery disease) (HCC)   HLD (hyperlipidemia)   Hyperglycemia   Essential hypertension   OSA (obstructive sleep apnea)   Chronic respiratory failure with hypoxia (HCC)  Sepsis secondary to cellulitis and acute osteomyelitis of the left foot with pathological fracture of fifth metatarsal. Concern for soft tissue gas. Discussed with podiatry Dr. Ashley.  Appreciate consultation. Recommend MRI foot. CK mildly elevated.  655 ESR 92.  CRP pending.  Meeting SIRS criteria with leukocytosis and tachypnea with evidence of infection in her foot. Most likely will require amputation.  Patient is aware. Plan: Status post AKA.  Postoperative day #1.  Diet as tolerated.  Pain medication as needed.  First dressing change over the weekend per vascular surgery  Sacral decubitus ulcer. Wound care consult. Continue foam dressing.   Uncontrolled type 2 diabetes mellitus with hyperglycemia, with diabetic neuropathy as well as diabetic foot infection. On Lyrica  at home.  Will continue although low threshold to reduce the dose given his sleep apnea..  Hemoglobin A1c greater than 10.  Poor control. Plan: Basal bolus regimen Augment to achieve blood glucose 140-180 Continue carb modified diet  Morbid obesity. Body mass index is 44.63 kg/m.  Obesity hypoventilation syndrome. Noncompliant with CPAP. Suspected chronic respiratory failure in the setting of OSA. Placing the patient at high risk for poor  outcome. Patient reports that he is losing weight on weight but reports diet indiscretion. Recommend outpatient follow-up with PCP. Ordered CPAP however patient refused Will need outpatient sleep study   HLD. On statin. Continue to hold.   Consider restarting 7/10   PAD. Continue aspirin    History of atrial flutter. History of GI bleed. No reoccurrence of atrial flutter per chart review. Had  multiple admission with GI bleed therefore not on any anticoagulation. Continue to monitor   Depression. Patient is on Cymbalta , Wellbutrin . Continue Cymbalta    Chronic pain syndrome. Home regimen includes OxyContin  10 mg twice daily as well as oxycodone  as needed. Continue home regimen.  Augment as necessary to achieve pain control    DVT prophylaxis: SQ Lovenox  Code Status: Full Family Communication: None Disposition Plan: Status is: Inpatient Remains inpatient appropriate because: Gangrenous lower extremity.  Status post amputation.  Will need skilled nursing facility.   Level of care: Progressive  Consultants:  Vascular surgery  Procedures:  AKA 7/9  Antimicrobials: Zosyn  Zyvox    Subjective: Seen and examined.  Pain moderate.  Reports phantom knee pain on left lower extremity  Objective: Vitals:   09/07/23 2309 09/08/23 0301 09/08/23 0744 09/08/23 1159  BP: (!) 117/55 127/70 128/68 132/65  Pulse: 81 73 74 84  Resp: 18 19 15 15   Temp: 98 F (36.7 C) 97.9 F (36.6 C) 97.8 F (36.6 C) 97.8 F (36.6 C)  TempSrc:   Oral Oral  SpO2: (!) 86% 100% 99% 99%  Weight:      Height:        Intake/Output Summary (Last 24 hours) at 09/08/2023 1439 Last data filed at 09/08/2023 1433 Gross per 24 hour  Intake 2775 ml  Output 2430 ml  Net 345 ml   Filed Weights   09/06/23 0633 09/06/23 1940 09/07/23 1115  Weight: (!) 145.2 kg (!) 155.2 kg (!) 155.1 kg    Examination:  General exam: No acute distress Respiratory system: Clear to auscultation. Respiratory effort normal. Cardiovascular system: S1 S2, RRR, no murmurs Gastrointestinal system: Obese, Soft, NT/ND, normal bowel sounds Central nervous system: Alert and oriented. No focal neurological deficits. Extremities: Right upper extremity amputation, right lower extremity BKA, left lower extremity status post AKA Skin: No rashes, lesions or ulcers Psychiatry: Judgement and insight appear normal. Mood & affect  appropriate.     Data Reviewed: I have personally reviewed following labs and imaging studies  CBC: Recent Labs  Lab 09/06/23 0648 09/07/23 0412 09/07/23 2047 09/08/23 0405  WBC 14.6* 11.2* 10.4 12.1*  NEUTROABS 13.0*  --   --   --   HGB 9.0* 8.0* 8.6* 7.6*  HCT 29.8* 26.5* 27.9* 24.2*  MCV 69.1* 69.0* 73.6* 72.9*  PLT 260 241 256 258   Basic Metabolic Panel: Recent Labs  Lab 09/06/23 0648 09/06/23 1018 09/06/23 2016 09/07/23 0412 09/08/23 0918  NA 129* 130* 129* 132* 130*  K 4.6 4.3 3.9 3.7 4.7  CL 96* 98 97* 98 99  CO2 25 23 23 23 24   GLUCOSE 540* 506* 415* 189* 362*  BUN 24* 24* 26* 26* 29*  CREATININE 1.12 1.10 1.00 1.08 1.21  CALCIUM  8.4* 8.0* 8.1* 8.1* 7.5*   GFR: Estimated Creatinine Clearance: 99.7 mL/min (by C-G formula based on SCr of 1.21 mg/dL). Liver Function Tests: Recent Labs  Lab 09/06/23 0648  AST 36  ALT 18  ALKPHOS 112  BILITOT 0.5  PROT 7.5  ALBUMIN  2.4*   No results for input(s): LIPASE, AMYLASE in the last  168 hours. No results for input(s): AMMONIA in the last 168 hours. Coagulation Profile: Recent Labs  Lab 09/06/23 0648 09/06/23 1020  INR 1.4* 1.4*   Cardiac Enzymes: Recent Labs  Lab 09/06/23 1020  CKTOTAL 655*   BNP (last 3 results) No results for input(s): PROBNP in the last 8760 hours. HbA1C: Recent Labs    09/06/23 1020  HGBA1C 10.1*   CBG: Recent Labs  Lab 09/07/23 2056 09/08/23 0008 09/08/23 0416 09/08/23 0745 09/08/23 1200  GLUCAP 374* 335* 322* 295* 364*   Lipid Profile: No results for input(s): CHOL, HDL, LDLCALC, TRIG, CHOLHDL, LDLDIRECT in the last 72 hours. Thyroid  Function Tests: No results for input(s): TSH, T4TOTAL, FREET4, T3FREE, THYROIDAB in the last 72 hours. Anemia Panel: No results for input(s): VITAMINB12, FOLATE, FERRITIN, TIBC, IRON , RETICCTPCT in the last 72 hours. Sepsis Labs: Recent Labs  Lab 09/06/23 0648  PROCALCITON 3.40   LATICACIDVEN 1.8    Recent Results (from the past 240 hours)  Culture, blood (Routine x 2)     Status: None (Preliminary result)   Collection Time: 09/06/23  6:48 AM   Specimen: BLOOD RIGHT ARM  Result Value Ref Range Status   Specimen Description   Final    BLOOD RIGHT ARM Performed at [facility], [address] [place], [place] [address]    Special Requests   Final    BOTTLES DRAWN AEROBIC AND ANAEROBIC Blood Culture adequate volume Performed at [facility], [address] [place], [place] [address]    Culture   Final    NO GROWTH 2 DAYS Performed at [facility], [address] [place], [place] [address]    Report Status PENDING  Incomplete  Culture, blood (Routine x 2)     Status: None (Preliminary result)   Collection Time: 09/06/23  9:16 AM   Specimen: BLOOD  Result Value Ref Range Status   Specimen Description BLOOD BLOOD RIGHT WRIST  Final   Special Requests   Final    BOTTLES DRAWN AEROBIC AND ANAEROBIC Blood Culture adequate volume   Culture   Final    NO GROWTH < 24 HOURS Performed at [facility], [address] [place], [place] [address]    Report Status PENDING  Incomplete  Aerobic Culture w Gram Stain (superficial specimen)     Status: None (Preliminary result)   Collection Time: 09/06/23 10:18 AM   Specimen: Foot; Wound  Result Value Ref Range Status   Specimen Description FOOT  Final   Special Requests LT FOOT  Final   Gram Stain   Final    NO WBC SEEN RARE GRAM NEGATIVE RODS FEW GRAM POSITIVE COCCI IN PAIRS    Culture   Final    ABUNDANT PROTEUS MIRABILIS CULTURE REINCUBATED FOR BETTER GROWTH SUSCEPTIBILITIES TO FOLLOW    Report Status PENDING  Incomplete   Organism ID, Bacteria PROTEUS MIRABILIS  Final      Susceptibility   Proteus mirabilis - MIC*    AMPICILLIN  <=2 SENSITIVE Sensitive     CEFEPIME  <=0.12 SENSITIVE Sensitive     CEFTAZIDIME <=1 SENSITIVE Sensitive     CEFTRIAXONE  <=0.25 SENSITIVE  Sensitive     CIPROFLOXACIN  <=0.25 SENSITIVE Sensitive     GENTAMICIN <=1 SENSITIVE Sensitive     IMIPENEM 2 SENSITIVE Sensitive     TRIMETH/SULFA <=20 SENSITIVE Sensitive     AMPICILLIN /SULBACTAM <=2 SENSITIVE Sensitive     PIP/TAZO Value in next row Sensitive ug/mL     <=4 SENSITIVEPerformed  at [facility], [address], [place], [place] [address]    * ABUNDANT PROTEUS MIRABILIS  MRSA Next Gen by PCR, Nasal     Status: Abnormal   Collection Time: 09/07/23  4:41 AM   Specimen: Nasal Mucosa; Nasal Swab  Result Value Ref Range Status   MRSA by PCR Next Gen DETECTED (A) NOT DETECTED Final    Comment: RESULT CALLED TO, READ BACK BY AND VERIFIED WITH: ALLEAN BROCKS RN [phone] 09/07/23 HNM (NOTE) The GeneXpert MRSA Assay (FDA approved for NASAL specimens only), is one component of a comprehensive MRSA colonization surveillance program. It is not intended to diagnose MRSA infection nor to guide or monitor treatment for MRSA infections. Test performance is not FDA approved in patients less than 31 years old. Performed at [facility], [address] [place], [place] [address]          Radiology Studies: No results found.       Scheduled Meds:  vitamin C   500 mg Oral BID   Chlorhexidine  Gluconate Cloth  6 each Topical Daily   DULoxetine   90 mg Oral Daily   enoxaparin  (LOVENOX ) injection  70 mg Subcutaneous Q24H   insulin  aspart  0-20 Units Subcutaneous Q4H   insulin  aspart  5 Units Subcutaneous TID WC   insulin  glargine-yfgn  17 Units Subcutaneous QHS   methocarbamol   750 mg Oral QID   multivitamin with minerals  1 tablet Oral Daily   mupirocin  ointment  1 Application Nasal BID   nutrition supplement (JUVEN)  1 packet Oral BID BM   pantoprazole   40 mg Oral BID   pregabalin   150 mg Oral TID   Ensure Max Protein  11 oz Oral QHS   tamsulosin   0.4 mg Oral QPC supper   zinc  sulfate (50mg  elemental zinc )  220 mg Oral Daily   Continuous Infusions:  linezolid   (ZYVOX ) IV 600 mg (09/08/23 0915)   piperacillin -tazobactam (ZOSYN )  IV 3.375 g (09/08/23 1426)     LOS: 2 days     Calvin KATHEE Robson, MD [facility]   If 7PM-7AM, please contact night-coverage  09/08/2023, 2:39 PM

## 2023-09-08 NOTE — Progress Notes (Signed)
 Orthopedic Tech Progress Note Patient Details:  [name] 12/31/1965 [id number]  Order for a L AKA shrinker/Vive AK Protocol was called into [facility]. I did confirm with Dr. Jhonny that these items were needed for the acute L AKA and not the chronic R BKA as listed in the order.   These items should arrive Friday, 7/11.  Patient ID: Russell [name], male   DOB: Mar 27, 1965, 58 y.o.   MRN: [id number]  Tinnie Ronal Brasil 09/08/2023, 2:52 PM

## 2023-09-08 NOTE — Evaluation (Signed)
 Occupational Therapy Evaluation Patient Details Name: Russell [name] MRN: [id number] DOB: May 02, 1965 Today's Date: 09/08/2023   History of Present Illness   Pt is a 58 y.o. male s/p L AKA on 09/07/23. PMH of left hand osteomyelitis S/P transradial amputation in 2/25, right BKA, decubitus ulcer of sacrum and left foot ulcer, PAD, history of GI bleed, history of A -flutter, HTN, morbid obesity, OSA not on CPAP, depression, uncontrolled type II DM with neuropathy, chronic HFpEF, chronic back pain     Clinical Impressions Pt admitted with above diagnosis. Pt has had multiple hospitalizations, including a recent 38-month stay at [facility] (per pt). Pt states he was walking with PT for short distances using RLE prothesis + L PFRW due to recent L transradial amputation 04/2023. Pt was discharged to a hotel for few days while his new ADA accessible apartment was renovated, and reports he was able to manage his BADLs from his power wheelchair. Pt is hopeful to discharge home to apartment with support from spouse + daughter.   Pt receive supine, is able to transition from supine > rolling > seated with MIN A, performs oral care seated with SBA for seated balance and MIN A to open containers due to unilateral UE. Anticipate pt will require up to MAX A for bed level LB ADLs given chronic R BKA / new L AKA / L transradial amputation and +2 for lateral or anterior/posterior transfers to drop arm bariatric BSC. Pt does have prothesis for R AKA, but does not have appropriate sleeve (lost since last year). Pt would benefit from skilled OT services to address noted impairments and functional limitations (see below for any additional details) in order to maximize safety and independence while minimizing falls risk and caregiver burden. Anticipate the need for follow up OT services upon acute hospital DC. Patient will benefit from continued inpatient follow up therapy, <3 hours/day.        If plan is discharge home,  recommend the following:   Two people to help with walking and/or transfers;A lot of help with bathing/dressing/bathroom;Assistance with cooking/housework;Direct supervision/assist for financial management;Supervision due to cognitive status;Direct supervision/assist for medications management;Help with stairs or ramp for entrance;Assist for transportation     Functional Status Assessment   Patient has had a recent decline in their functional status and demonstrates the ability to make significant improvements in function in a reasonable and predictable amount of time.     Equipment Recommendations   [employer];Hospital bed;BSC/3in1 (bari DA [facility])      Precautions/Restrictions   Precautions Precautions: Fall Precaution/Restrictions Comments: chronic R BKA, acute L AKA, L transradial amputation, foley     Mobility Bed Mobility Overal bed mobility: Needs Assistance Bed Mobility: Rolling, Sidelying to Sit Rolling: Min assist, Used rails Sidelying to sit: Min assist, HOB elevated, Used rails Supine to sit: Mod assist, HOB elevated, Used rails     General bed mobility comments: pt will benefit from use of [place] trapeze (order has been placed)    Transfers Overall transfer level: Needs assistance                 General transfer comment: anticipate +2 for safety due to body habitus      Balance Overall balance assessment: Needs assistance Sitting-balance support: Single extremity supported Sitting balance-Leahy Scale: Good Sitting balance - Comments: no LOB while performing oral care seated EOB       Standing balance comment: unable due to LE amputations  ADL either performed or assessed with clinical judgement   ADL Overall ADL's : Needs assistance/impaired     Grooming: Oral care;Minimal assistance;Sitting Grooming Details (indicate cue type and reason): [name] to open toothpaste container for efficiency Upper Body  Bathing: Moderate assistance;Sitting Upper Body Bathing Details (indicate cue type and reason): anticipate MOD A due to body habitus / use of unilateral UE Lower Body Bathing: Maximal assistance;Bed level Lower Body Bathing Details (indicate cue type and reason): anticipate bed level given current level of deficits and new L AKA Upper Body Dressing : Minimal assistance;Sitting   Lower Body Dressing: Maximal assistance;Sitting/lateral leans   Toilet Transfer: +2 for physical assistance;+2 for safety/equipment;BSC/3in1 Toilet Transfer Details (indicate cue type and reason): will need DA bari BSC and anticipate +2 for safety due to body habitus Toileting- Clothing Manipulation and Hygiene: Bed level;Maximal assistance Toileting - Clothing Manipulation Details (indicate cue type and reason): foley     Functional mobility during ADLs: +2 for physical assistance General ADL Comments: transfers not attempted secondary to fatigue/pain (pt recently back to bed after being up to [facility] / recliner).     Vision Baseline Vision/History: 4 Cataracts Ability to See in Adequate Light: 1 Impaired Additional Comments: pt reports recent cateract surgery with improved vision            Pertinent Vitals/Pain Pain Assessment Pain Assessment: Faces Faces Pain Scale: Hurts even more Pain Location: Left femoral amputation Pain Descriptors / Indicators: Aching, Dull, Discomfort Pain Intervention(s): Limited activity within patient's tolerance, Monitored during session, Premedicated before session, Repositioned     Extremity/Trunk Assessment Upper Extremity Assessment Upper Extremity Assessment: Right hand dominant;LUE deficits/detail;RUE deficits/detail RUE Sensation: decreased light touch;history of peripheral neuropathy LUE Deficits / Details: L transradial amputation 04/2023   Lower Extremity Assessment Lower Extremity Assessment: RLE deficits/detail;LLE deficits/detail RLE Deficits / Details: chronic  R BKA LLE Deficits / Details: acute L AKA, bandage intact start and end of session   Cervical / Trunk Assessment Cervical / Trunk Assessment: Normal (large body habitus)   Communication Communication Communication: No apparent difficulties   Cognition Arousal: Alert Behavior During Therapy: WFL for tasks assessed/performed Cognition: Cognition impaired     Awareness: Intellectual awareness impaired   Attention impairment (select first level of impairment): Sustained attention Executive functioning impairment (select all impairments): Initiation, Organization, Sequencing, Reasoning, Problem solving OT - Cognition Comments: questionable insight into deficits, pt states he plans to d/c home despite ongoing medical complications                 Following commands: Intact       Cueing  General Comments   Cueing Techniques: Verbal cues  L AKA dressing intact, NT in room end of session for vitals           Home Living Family/patient expects to be discharged to:: Private residence Living Arrangements: Alone Available Help at Discharge: Family Type of Home: Apartment (ADA accessible apartment) Home Access: Level entry     Home Layout: One level     Bathroom Shower/Tub: Tub/shower unit;Other (comment)   Bathroom Toilet: Handicapped height Bathroom Accessibility: Yes How Accessible: Accessible via wheelchair Home Equipment: Wheelchair - power;Shower seat   Additional Comments: pt states he has recently moved into new ADA accessible first floor apt. renovations are in progress for full w/c accessibility.      Prior Functioning/Environment Prior Level of Function : Needs assist             Mobility Comments: pt states he  sleeps in recliner, uses power wheel chair. recent prolonged hospitalization at [facility], pt states he was walking very short distances with RLE prosthesis (does not fit appropriately, and has not since Fall 2024) and L PFRW with PT. ADLs  Comments: pt states he was MOD I with ADLs (question accuracy of this as pt states he was d/c from [name] to hotel). he was only home at his new apt for few days before returning to [facility].    OT Problem List: Decreased strength;Decreased knowledge of use of DME or AE;Impaired vision/perception;Decreased range of motion;Decreased coordination;Decreased knowledge of precautions;Obesity;Decreased activity tolerance;Decreased cognition;Impaired UE functional use;Impaired balance (sitting and/or standing);Decreased safety awareness;Impaired sensation;Pain   OT Treatment/Interventions: Self-care/ADL training;Visual/perceptual remediation/compensation;Therapeutic exercise;Patient/family education;Balance training;Therapeutic activities;Energy conservation;DME and/or AE instruction;Cognitive remediation/compensation      OT Goals(Current goals can be found in the care plan section)   Acute Rehab OT Goals OT Goal Formulation: With patient Time For Goal Achievement: 09/22/23 Potential to Achieve Goals: Good   OT Frequency:  Min 2X/week       AM-PAC OT 6 Clicks Daily Activity     Outcome Measure Help from another person eating meals?: A Little Help from another person taking care of personal grooming?: A Little Help from another person toileting, which includes using toliet, bedpan, or urinal?: Total Help from another person bathing (including washing, rinsing, drying)?: A Lot Help from another person to put on and taking off regular upper body clothing?: A Lot Help from another person to put on and taking off regular lower body clothing?: A Lot 6 Click Score: 13   End of Session Nurse Communication: Mobility status  Activity Tolerance: Patient tolerated treatment well Patient left: in bed;with nursing/sitter in room;with bed alarm set;with call bell/phone within reach  OT Visit Diagnosis: Other abnormalities of gait and mobility (R26.89);Muscle weakness (generalized) (M62.81);Pain Pain -  Right/Left: Left Pain - part of body: Leg;Hip                Time: 8867-8842 OT Time Calculation (min): 25 min Charges:  OT General Charges $OT Visit: 1 Visit OT Evaluation $OT Eval High Complexity: 1 High OT Treatments $Self Care/Home Management : 8-22 mins  Tallon Gertz L. Emrys Mceachron, OTR/L  09/08/23, 1:39 PM

## 2023-09-08 NOTE — Progress Notes (Signed)
 Progress Note    09/08/2023 10:11 AM 1 Day Post-Op  Subjective: Russell [name] is a 58 year old male who is now postop day 1 left above-the-knee amputation.  Patient was sleeping when I arrived to examine him this morning.  He endorses pain to the incision site but is being managed well with pain medication.  No other complaints overnight.  Vitals all remained stable.   Vitals:   09/08/23 0301 09/08/23 0744  BP: 127/70 128/68  Pulse: 73 74  Resp: 19 15  Temp: 97.9 F (36.6 C) 97.8 F (36.6 C)  SpO2: 100% 99%   Physical Exam: Cardiac:  RRR, normal S1 and S2.  No rubs clicks gallops or murmurs noted. Lungs: Nonlabored breathing, clear throughout auscultation but diminished bases.  No rales rhonchi or wheezing noted Incisions: Left above-the-knee with dressing clean dry and intact Extremities: Patient has a history of right BKA left upper arm below the elbow amputation wound left above-the-knee amputation.  Patient's right upper extremity with palpable pulses. Abdomen: Positive bowel sounds throughout, morbid obesity with soft abdomen patient. Neurologic: Alert and oriented x 3, questions follows commands appropriately.  CBC    Component Value Date/Time   WBC 12.1 (H) 09/08/2023 0405   RBC 3.32 (L) 09/08/2023 0405   HGB 7.6 (L) 09/08/2023 0405   HGB 12.6 (L) 06/13/2014 0421   HCT 24.2 (L) 09/08/2023 0405   HCT 39.0 (L) 06/13/2014 0421   PLT 258 09/08/2023 0405   PLT 190 06/13/2014 0421   MCV 72.9 (L) 09/08/2023 0405   MCV 80 06/13/2014 0421   MCH 22.9 (L) 09/08/2023 0405   MCHC 31.4 09/08/2023 0405   RDW 18.6 (H) 09/08/2023 0405   RDW 15.1 (H) 06/13/2014 0421   LYMPHSABS 0.7 09/06/2023 0648   LYMPHSABS 2.1 06/13/2014 0421   MONOABS 0.7 09/06/2023 0648   MONOABS 0.8 06/13/2014 0421   EOSABS 0.0 09/06/2023 0648   EOSABS 0.4 06/13/2014 0421   BASOSABS 0.0 09/06/2023 0648   BASOSABS 0.1 06/13/2014 0421    BMET    Component Value Date/Time   NA 130 (L) 09/08/2023  0918   NA 136 06/13/2014 0421   K 4.7 09/08/2023 0918   K 4.0 06/13/2014 0421   CL 99 09/08/2023 0918   CL 100 (L) 06/13/2014 0421   CO2 24 09/08/2023 0918   CO2 29 06/13/2014 0421   GLUCOSE 362 (H) 09/08/2023 0918   GLUCOSE 143 (H) 06/13/2014 0421   BUN 29 (H) 09/08/2023 0918   BUN 22 (H) 06/13/2014 0421   CREATININE 1.21 09/08/2023 0918   CREATININE 0.87 06/13/2014 0421   CALCIUM  7.5 (L) 09/08/2023 0918   CALCIUM  8.6 (L) 06/13/2014 0421   GFRNONAA >60 09/08/2023 0918   GFRNONAA >60 06/13/2014 0421   GFRAA >60 06/03/2018 0014   GFRAA >60 06/13/2014 0421    INR    Component Value Date/Time   INR 1.4 (H) 09/06/2023 1020   INR 0.9 06/05/2014 2241     Intake/Output Summary (Last 24 hours) at 09/08/2023 1011 Last data filed at 09/08/2023 0743 Gross per 24 hour  Intake 2735 ml  Output 2930 ml  Net -195 ml     Assessment/Plan:  58 y.o. male is s/p left above-the-knee amputation 1 Day Post-Op   PLAN Advance diet as tolerated. Pain medication as needed First dressing change will be done over the weekend by vascular surgery. Plan is for patient to go back to his previous living arrangements.  DVT prophylaxis: Lovenox  70 mg subcu every 24  hours   Russell [name] [facility] 09/08/2023 10:11 AM

## 2023-09-08 NOTE — Evaluation (Signed)
 Physical Therapy Evaluation Patient Details Name: Russell [name] MRN: [id number] DOB: 11/10/1965 Today's Date: 09/08/2023  History of Present Illness  LEONCE BALE is a 39bnF who presents with left foot osteomyelitis. PMH: RLE and LUE. The patient is scheduled for a left above-the-knee amputation 09/07/23.  Clinical Impression  Pt in high degree of pain during evaluation 8.5/10 at best, worse with mobiilty. PT requires modA of LLE for A/ROM due to pain. Pt requires MaxA for scoot transfers, would he better if pt can get his prosthesis here. I have asked MD for an order for RLE shrinker which he has lost. Pt in recliner at end of session. I started a facility search for a bariatric bed side commode with drop arms. No symptoms of anemia in session despite Hb: 7.5. WIll continue to follow.       If plan is discharge home, recommend the following: Two people to help with walking and/or transfers;Two people to help with bathing/dressing/bathroom   Can travel by private vehicle   No    Equipment Recommendations    Recommendations for Other Services       Functional Status Assessment Patient has had a recent decline in their functional status and demonstrates the ability to make significant improvements in function in a reasonable and predictable amount of time.     Precautions / Restrictions Precautions Precautions: Fall      Mobility  Bed Mobility Overal bed mobility: Needs Assistance Bed Mobility: Supine to Sit     Supine to sit: HOB elevated, Mod assist          Transfers Overall transfer level: Needs assistance   Transfers: Bed to chair/wheelchair/BSC         Anterior-Posterior transfers: Max assist (posterior scoot from Left EOB to Rt EOB recliner.)        Ambulation/Gait                  Stairs            Wheelchair Mobility     Tilt Bed    Modified Rankin (Stroke Patients Only)       Balance                                              Pertinent Vitals/Pain Pain Assessment Pain Assessment: 0-10 Pain Score: 8  (8.5) Pain Location: Left femoral amputation Pain Intervention(s): Limited activity within patient's tolerance, Monitored during session    Home Living Family/patient expects to be discharged to:: Private residence Living Arrangements: Alone Available Help at Discharge: Family Type of Home: Apartment (handicapped accessible apartment owned by church) Home Access: Level entry       Home Layout: One level Home Equipment: Wheelchair - power;Shower seat (up walker) Additional Comments: sleeping on an inflatable mattress    Prior Function                       Extremity/Trunk Assessment                Communication        Cognition                                         Cueing  General Comments      Exercises     Assessment/Plan    PT Assessment Patient needs continued PT services  PT Problem List Decreased strength;Decreased range of motion;Decreased activity tolerance;Decreased balance;Decreased mobility;Decreased coordination;Decreased knowledge of use of DME       PT Treatment Interventions DME instruction;Gait training;Stair training;Functional mobility training;Therapeutic activities;Therapeutic exercise    PT Goals (Current goals can be found in the Care Plan section)  Acute Rehab PT Goals PT Goal Formulation: Patient unable to participate in goal setting    Frequency Min 3X/week     Co-evaluation               AM-PAC PT 6 Clicks Mobility  Outcome Measure Help needed turning from your back to your side while in a flat bed without using bedrails?: Total Help needed moving from lying on your back to sitting on the side of a flat bed without using bedrails?: Total Help needed moving to and from a bed to a chair (including a wheelchair)?: Total Help needed standing up from a chair using your arms (e.g.,  wheelchair or bedside chair)?: Total Help needed to walk in hospital room?: Total Help needed climbing 3-5 steps with a railing? : Total 6 Click Score: 6    End of Session   Activity Tolerance: Patient tolerated treatment well;Patient limited by fatigue;Patient limited by pain Patient left: in bed;with call bell/phone within reach;with nursing/sitter in room   PT Visit Diagnosis: Unsteadiness on feet (R26.81);Other abnormalities of gait and mobility (R26.89);Repeated falls (R29.6);Muscle weakness (generalized) (M62.81);Difficulty in walking, not elsewhere classified (R26.2)    Time: 0922-1001 PT Time Calculation (min) (ACUTE ONLY): 39 min   Charges:   PT Evaluation $PT Eval High Complexity: 1 High PT Treatments $Therapeutic Activity: 8-22 mins PT General Charges $$ ACUTE PT VISIT: 1 Visit       1:16 PM, 09/08/23 Peggye JAYSON Linear, PT, DPT Physical Therapist - [facility]  [phone] (ASCOM)    Merian Wroe C 09/08/2023, 1:11 PM

## 2023-09-09 DIAGNOSIS — Z89612 Acquired absence of left leg above knee: Secondary | ICD-10-CM

## 2023-09-09 DIAGNOSIS — M86172 Other acute osteomyelitis, left ankle and foot: Secondary | ICD-10-CM | POA: Diagnosis not present

## 2023-09-09 LAB — GLUCOSE, CAPILLARY
Glucose-Capillary: 102 mg/dL — ABNORMAL HIGH (ref 70–99)
Glucose-Capillary: 123 mg/dL — ABNORMAL HIGH (ref 70–99)
Glucose-Capillary: 147 mg/dL — ABNORMAL HIGH (ref 70–99)
Glucose-Capillary: 112 mg/dL — ABNORMAL HIGH (ref 70–99)

## 2023-09-09 LAB — SURGICAL PATHOLOGY

## 2023-09-09 MED ORDER — OXYCODONE HCL ER 15 MG PO T12A
15.0000 mg | EXTENDED_RELEASE_TABLET | Freq: Two times a day (BID) | ORAL | Status: DC
  Administered 2023-09-09 – 2023-09-12 (×6): 15 mg via ORAL
  Filled 2023-09-09 (×7): qty 1

## 2023-09-09 MED ORDER — OXYCODONE HCL ER 15 MG PO T12A: 15.0000 mg | EXTENDED_RELEASE_TABLET | Freq: Two times a day (BID) | ORAL | Status: DC

## 2023-09-09 MED ORDER — HYDROMORPHONE HCL 1 MG/ML IJ SOLN
0.5000 mg | INTRAMUSCULAR | Status: DC | PRN
  Administered 2023-09-09 (×4): 0.5 mg via INTRAVENOUS
  Filled 2023-09-09 (×4): qty 0.5

## 2023-09-09 NOTE — Progress Notes (Signed)
 Physical Therapy Treatment Patient Details Name: Russell [name] MRN: [id number] DOB: March 14, 1965 Today's Date: 09/09/2023   History of Present Illness Pt is a 58 y.o. male s/p L AKA on 09/07/23. PMH of left hand osteomyelitis S/P transradial amputation in 2/25, right BKA, decubitus ulcer of sacrum and left foot ulcer, PAD, history of GI bleed, history of A -flutter, HTN, morbid obesity, OSA not on CPAP, depression, uncontrolled type II DM with neuropathy, chronic HFpEF, chronic back pain    PT Comments  Pt seen with co-tx session for safety of pt.  Pt upright at EOB upon arrival to the room.  Pt guided in seated level exercises with the LE's in order to strengthen the hip flexors bilaterally and the R quads with SAQ's.  Pt also encouraged throughout the session to remain seated upright without any UE support in order to challenge core stability while seated.  Pt did well with sitting tolerance and was educated on pain management technique in sitting to alleviate the discomfort felt when placing increased weight on the L glute in sitting.  Pt able to perform seated twists and weight shifts well and scooting towards HOB without any physical assistance.  Pt then transitioned back to bed and left with all needs met and call bell within reach.     If plan is discharge home, recommend the following: Two people to help with walking and/or transfers;Two people to help with bathing/dressing/bathroom   Can travel by private vehicle     No  Equipment Recommendations       Recommendations for Other Services       Precautions / Restrictions Precautions Precautions: Fall Recall of Precautions/Restrictions: Impaired Precaution/Restrictions Comments: chronic R BKA, acute L AKA, L transradial amputation, foley Restrictions Weight Bearing Restrictions Per Provider Order: No     Mobility  Bed Mobility Overal bed mobility: Needs Assistance Bed Mobility: Sit to Supine       Sit to supine: Supervision         Transfers Overall transfer level: Needs assistance                Lateral/Scoot Transfers: Supervision General transfer comment: lateral scoot EOB    Ambulation/Gait               General Gait Details: deferred until pt is able to get prosthetic correctly donned.   Stairs             Wheelchair Mobility     Tilt Bed    Modified Rankin (Stroke Patients Only)       Balance Overall balance assessment: Needs assistance Sitting-balance support: No upper extremity supported Sitting balance-Leahy Scale: Good         Standing balance comment: unable due to LE amputations                            Communication Communication Communication: No apparent difficulties  Cognition Arousal: Alert Behavior During Therapy: WFL for tasks assessed/performed                             Following commands: Intact      Cueing Cueing Techniques: Verbal cues, Gestural cues  Exercises      General Comments        Pertinent Vitals/Pain Pain Assessment Pain Assessment: Faces Faces Pain Scale: Hurts little more Pain Location: LLE Pain Descriptors / Indicators: Aching, Discomfort, Grimacing Pain  Intervention(s): Limited activity within patient's tolerance, Monitored during session, Premedicated before session, Repositioned    Home Living                          Prior Function            PT Goals (current goals can now be found in the care plan section) Acute Rehab PT Goals PT Goal Formulation: Patient unable to participate in goal setting    Frequency    Min 3X/week      PT Plan      Co-evaluation   Reason for Co-Treatment: For patient/therapist safety;To address functional/ADL transfers   OT goals addressed during session: ADL's and self-care      AM-PAC PT 6 Clicks Mobility   Outcome Measure  Help needed turning from your back to your side while in a flat bed without using bedrails?: A  Lot Help needed moving from lying on your back to sitting on the side of a flat bed without using bedrails?: Total Help needed moving to and from a bed to a chair (including a wheelchair)?: Total Help needed standing up from a chair using your arms (e.g., wheelchair or bedside chair)?: Total Help needed to walk in hospital room?: Total Help needed climbing 3-5 steps with a railing? : Total 6 Click Score: 7    End of Session   Activity Tolerance: Patient tolerated treatment well;Patient limited by fatigue;Patient limited by pain Patient left: in bed;with call bell/phone within reach;with nursing/sitter in room   PT Visit Diagnosis: Unsteadiness on feet (R26.81);Other abnormalities of gait and mobility (R26.89);Repeated falls (R29.6);Muscle weakness (generalized) (M62.81);Difficulty in walking, not elsewhere classified (R26.2)     Time: 8578-8546 PT Time Calculation (min) (ACUTE ONLY): 32 min  Charges:    $Therapeutic Activity: 8-22 mins PT General Charges $$ ACUTE PT VISIT: 1 Visit                     Fonda Simpers, PT, DPT Physical Therapist - [facility]  09/09/23, 4:29 PM

## 2023-09-09 NOTE — Progress Notes (Signed)
 Occupational Therapy Treatment Patient Details Name: Russell [name] MRN: [id number] DOB: [birth year] Today's Date: 09/09/2023   History of present illness Pt is a 58 y.o. male s/p L AKA on 09/07/23. PMH of left hand osteomyelitis S/P transradial amputation in 2/25, right BKA, decubitus ulcer of sacrum and left foot ulcer, PAD, history of GI bleed, history of A -flutter, HTN, morbid obesity, OSA not on CPAP, depression, uncontrolled type II DM with neuropathy, chronic HFpEF, chronic back pain   OT comments  Russell [name] seen for OT treatment on this date. Upon arrival to room pt seated EOB, agreeable to tx. Pt requires MIN A for grooming task seated EOB. Pt demonstrated good sitting tolerance; required encouragement to avoid left lateral lean. Pt educated on desensitization of residual limb on LLE, and seated HEP. Pt required SUPERVISION for lateral scoot toward HOB and bed mobility. Pt demonstrates poor safety awareness and insight. Pt making good progress toward goals, will continue to follow POC. Discharge recommendation remains appropriate.        If plan is discharge home, recommend the following:  Two people to help with walking and/or transfers;A lot of help with bathing/dressing/bathroom;Assistance with cooking/housework;Direct supervision/assist for financial management;Supervision due to cognitive status;Direct supervision/assist for medications management;Help with stairs or ramp for entrance;Assist for transportation   Equipment Recommendations  [place] lift;Hospital bed;BSC/3in1    Recommendations for Other Services      Precautions / Restrictions Precautions Precautions: Fall Recall of Precautions/Restrictions: Impaired Precaution/Restrictions Comments: chronic R BKA, acute L AKA, L transradial amputation, foley Restrictions Weight Bearing Restrictions Per Provider Order: No       Mobility Bed Mobility Overal bed mobility: Needs Assistance Bed Mobility: Sit to Supine        Sit to supine: Supervision        Transfers Overall transfer level: Needs assistance                Lateral/Scoot Transfers: Supervision General transfer comment: lateral scoot EOB     Balance Overall balance assessment: Needs assistance Sitting-balance support: No upper extremity supported Sitting balance-Leahy Scale: Good                                     ADL either performed or assessed with clinical judgement   ADL Overall ADL's : Needs assistance/impaired                                       General ADL Comments: MIN A for grooming at EOB    Extremity/Trunk Assessment Upper Extremity Assessment Upper Extremity Assessment: LUE deficits/detail RUE Sensation: decreased light touch;history of peripheral neuropathy LUE Deficits / Details: L transradial amputation 04/2023   Lower Extremity Assessment Lower Extremity Assessment: RLE deficits/detail;LLE deficits/detail RLE Deficits / Details: chronic R BKA LLE Deficits / Details: acute L AKA, bandage intact start and end of session        Vision       Perception     Praxis     Communication Communication Communication: No apparent difficulties   Cognition Arousal: Alert Behavior During Therapy: WFL for tasks assessed/performed Cognition: No family/caregiver present to determine baseline                               Following commands:  Intact        Cueing   Cueing Techniques: Verbal cues, Gestural cues  Exercises      Shoulder Instructions       General Comments      Pertinent Vitals/ Pain       Pain Assessment Pain Assessment: Faces Faces Pain Scale: Hurts little more Pain Location: LLE Pain Descriptors / Indicators: Aching, Discomfort, Grimacing Pain Intervention(s): Limited activity within patient's tolerance, Monitored during session, Repositioned  Home Living                                          Prior  Functioning/Environment              Frequency  Min 2X/week        Progress Toward Goals  OT Goals(current goals can now be found in the care plan section)  Progress towards OT goals: Progressing toward goals  Acute Rehab OT Goals OT Goal Formulation: With patient Time For Goal Achievement: 09/23/23 Potential to Achieve Goals: Good ADL Goals Pt Will Perform Grooming: sitting;with modified independence Pt Will Perform Upper Body Dressing: with modified independence;sitting Pt Will Perform Lower Body Dressing: sitting/lateral leans;with mod assist Pt Will Transfer to Toilet: anterior/posterior transfer;bedside commode;with min assist;with transfer board Pt Will Perform Toileting - Clothing Manipulation and hygiene: with mod assist;with adaptive equipment;sitting/lateral leans  Plan      Co-evaluation    PT/OT/SLP Co-Evaluation/Treatment: Yes Reason for Co-Treatment: For patient/therapist safety;To address functional/ADL transfers   OT goals addressed during session: ADL's and self-care      AM-PAC OT 6 Clicks Daily Activity     Outcome Measure   Help from another person eating meals?: A Little Help from another person taking care of personal grooming?: A Little Help from another person toileting, which includes using toliet, bedpan, or urinal?: A Lot Help from another person bathing (including washing, rinsing, drying)?: A Lot Help from another person to put on and taking off regular upper body clothing?: A Little Help from another person to put on and taking off regular lower body clothing?: A Lot 6 Click Score: 15    End of Session    OT Visit Diagnosis: Other abnormalities of gait and mobility (R26.89);Muscle weakness (generalized) (M62.81);Pain Pain - Right/Left: Left Pain - part of body: Leg;Hip   Activity Tolerance Patient tolerated treatment well   Patient Left in bed;with call bell/phone within reach;with bed alarm set   Nurse Communication           Time: 8578-8546 OT Time Calculation (min): 32 min  Charges: OT General Charges $OT Visit: 1 Visit OT Treatments $Self Care/Home Management : 8-22 mins  Russell [name], Student OT   Russell [name] 09/09/2023, 4:13 PM

## 2023-09-09 NOTE — Plan of Care (Signed)
  Problem: Education: Goal: Ability to describe self-care measures that may prevent or decrease complications (Diabetes Survival Skills Education) will improve Outcome: Progressing   Problem: Nutritional: Goal: Maintenance of adequate nutrition will improve Outcome: Progressing   Problem: Skin Integrity: Goal: Risk for impaired skin integrity will decrease Outcome: Progressing   Problem: Education: Goal: Knowledge of General Education information will improve Description: Including pain rating scale, medication(s)/side effects and non-pharmacologic comfort measures Outcome: Progressing   Problem: Activity: Goal: Risk for activity intolerance will decrease Outcome: Progressing   Problem: Pain Managment: Goal: General experience of comfort will improve and/or be controlled Outcome: Progressing   Problem: Safety: Goal: Ability to remain free from injury will improve Outcome: Progressing

## 2023-09-09 NOTE — NC FL2 (Signed)
 [place]  MEDICAID FL2 LEVEL OF CARE FORM     IDENTIFICATION  Patient Name: Russell [name] Birthdate: [birth year] Sex: male Admission Date (Current Location): 09/06/2023  [facility] and [place] Number:  Chiropodist and Address:  [facility], [address] [place], [place] [address]      Provider Number: [id number]  Attending Physician Name and Address:  Jhonny Calvin NOVAK, MD  Relative Name and Phone Number:  Manav, Pierotti (Spouse)  [phone] [facility])    Current Level of Care: Hospital Recommended Level of Care: Skilled Nursing Facility Prior Approval Number:    Date Approved/Denied:   PASRR Number: [id number]  Discharge Plan: Home    Current Diagnoses: Patient Active Problem List   Diagnosis Date Noted   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) 09/09/2023   Acute osteomyelitis of metatarsal bone of left foot (HCC) 09/06/2023   OSA (obstructive sleep apnea) 09/06/2023   Chronic respiratory failure with hypoxia (HCC) 09/06/2023   Sepsis due to cellulitis (HCC) 02/27/2023   PAD (peripheral artery disease) (HCC) 11/10/2022   Generalized weakness 11/09/2022   Ambulatory dysfunction 11/09/2022   Residual hemorrhoidal skin tags 11/06/2022   Acute gastritis 11/06/2022   Major depressive disorder, recurrent episode, moderate (HCC) 10/28/2022   Dyslipidemia 10/24/2022   Anxiety and depression 10/24/2022   Essential hypertension 10/24/2022   Type 2 diabetes mellitus with peripheral neuropathy (HCC) 10/24/2022   Acute gastric ulcer with hemorrhage 10/24/2022   Acute urinary retention 10/17/2022   Acute respiratory failure with hypoxia (HCC) 10/15/2022   Multiple gastric ulcers 10/14/2022   Hyperosmolar hyperglycemic state (HHS) (HCC) 10/13/2022   HLD (hyperlipidemia) 10/13/2022   Acute on chronic diastolic CHF (congestive heart failure) (HCC) 10/13/2022   Depression with anxiety 10/13/2022   Hyperkalemia 10/13/2022    Elevated lactic acid level 10/13/2022   Rectal bleeding 10/13/2022   Acute blood loss anemia 10/13/2022   Wound infection_ left lower leg 10/13/2022   Upper GI bleed 10/13/2022   Hypovolemic shock (HCC) 10/13/2022   Epigastric abdominal pain 10/13/2022   Sciatica of right side 10/13/2022   AKI (acute kidney injury) (HCC) 10/13/2022   Hypotension 11/21/2020   Atrial flutter with rapid ventricular response (HCC) 11/21/2020   Chronic kidney disease, stage 4, severely decreased GFR (HCC) 06/14/2018   Chronic venous insufficiency 06/14/2018   Cellulitis of left lower extremity 02/01/2018   Cellulitis 05/18/2017   Hyperlipidemia, mixed 01/10/2017   Cellulitis and abscess of left leg 08/08/2016   Diabetes with ulcer of leg (HCC) 08/08/2016   Hyperglycemia 08/08/2016   Morbid obesity (HCC) 08/08/2016   Cellulitis and abscess of leg 08/08/2016   Adjustment disorder with depressed mood 11/21/2014   Other social stressor 11/21/2014   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   Diabetes (HCC) 11/21/2014    Orientation RESPIRATION BLADDER Height & Weight     Self, Time, Situation, Place  Normal External catheter, Incontinent Weight: (!) 155.1 kg Height:  5' 11 (180.3 cm)  BEHAVIORAL SYMPTOMS/MOOD NEUROLOGICAL BOWEL NUTRITION STATUS      Continent Diet (carb modified)  AMBULATORY STATUS COMMUNICATION OF NEEDS Skin   Limited Assist Verbally Surgical wounds (erythema to buttocks)                       Personal Care Assistance Level of Assistance  Bathing, Feeding, Dressing Bathing Assistance: Limited assistance Feeding assistance: Independent Dressing Assistance: Limited assistance     Functional Limitations Info  Sight, Hearing Sight Info:  Adequate Hearing Info: Adequate      SPECIAL CARE FACTORS FREQUENCY  PT (By licensed PT), OT (By licensed OT)     PT Frequency: Min 2x weekly OT Frequency: Min 2x weekly            Contractures Contractures Info: Not  present    Additional Factors Info  Code Status, Allergies Code Status Info: FULL Allergies Info: Fluoxetine, Shellfish Allergy, Sulfa Antibiotics, Vancomycin , Metformin           Current Medications (09/09/2023):  This is the current hospital active medication list Current Facility-Administered Medications  Medication Dose Route Frequency Provider Last Rate Last Admin   acetaminophen  (TYLENOL ) tablet 650 mg  650 mg Oral Q6H PRN Dew, Jason S, MD       Or   acetaminophen  (TYLENOL ) suppository 650 mg  650 mg Rectal Q6H PRN Dew, Jason S, MD       Ampicillin -Sulbactam (UNASYN ) 3 g in sodium chloride  0.9 % 100 mL IVPB  3 g Intravenous Q6H Jhonny Calvin NOVAK, MD   Stopped at 09/09/23 0953   ascorbic acid  (VITAMIN C ) tablet 500 mg  500 mg Oral BID Dew, Jason S, MD   500 mg at 09/09/23 9076   aspirin  EC tablet 81 mg  81 mg Oral Daily Sreenath, Sudheer B, MD   81 mg at 09/09/23 0923   atorvastatin  (LIPITOR) tablet 40 mg  40 mg Oral Daily Sreenath, Sudheer B, MD   40 mg at 09/09/23 9076   baclofen  (LIORESAL ) tablet 10 mg  10 mg Oral Q6H PRN Jhonny Calvin B, MD   10 mg at 09/08/23 2033   Chlorhexidine  Gluconate Cloth 2 % PADS 6 each  6 each Topical Daily Marea Selinda RAMAN, MD   6 each at 09/09/23 0819   DULoxetine  (CYMBALTA ) DR capsule 90 mg  90 mg Oral Daily Dew, Jason S, MD   90 mg at 09/09/23 9076   enoxaparin  (LOVENOX ) injection 70 mg  70 mg Subcutaneous Q24H Dew, Jason S, MD   70 mg at 09/08/23 2121   finasteride  (PROSCAR ) tablet 5 mg  5 mg Oral Daily Sreenath, Sudheer B, MD   5 mg at 09/09/23 9076   HYDROmorphone  (DILAUDID ) injection 0.5 mg  0.5 mg Intravenous Q3H PRN Sreenath, Sudheer B, MD   0.5 mg at 09/09/23 1343   insulin  aspart (novoLOG ) injection 0-20 Units  0-20 Units Subcutaneous Q4H Dew, Jason S, MD   3 Units at 09/09/23 1333   insulin  aspart (novoLOG ) injection 5 Units  5 Units Subcutaneous TID WC Jhonny Calvin B, MD   5 Units at 09/09/23 1333   insulin  glargine-yfgn (SEMGLEE )  injection 20 Units  20 Units Subcutaneous QHS Sreenath, Sudheer B, MD   20 Units at 09/08/23 2121   melatonin tablet 5 mg  5 mg Oral QHS PRN Dew, Jason S, MD   5 mg at 09/08/23 2303   methocarbamol  (ROBAXIN ) tablet 750 mg  750 mg Oral QID Sreenath, Sudheer B, MD   750 mg at 09/09/23 1332   multivitamin with minerals tablet 1 tablet  1 tablet Oral Daily Dew, Jason S, MD   1 tablet at 09/09/23 9076   mupirocin  ointment (BACTROBAN ) 2 % 1 Application  1 Application Nasal BID Marea Selinda RAMAN, MD   1 Application at 09/09/23 9076   nutrition supplement (JUVEN) (JUVEN) powder packet 1 packet  1 packet Oral BID BM Dew, Jason S, MD   1 packet at 09/09/23 1332   ondansetron  (ZOFRAN )  tablet 4 mg  4 mg Oral Q6H PRN Dew, Jason S, MD       Or   ondansetron  (ZOFRAN ) injection 4 mg  4 mg Intravenous Q6H PRN Dew, Jason S, MD   4 mg at 09/07/23 1123   oxyCODONE  (Oxy IR/ROXICODONE ) immediate release tablet 5-10 mg  5-10 mg Oral Q4H PRN Sreenath, Sudheer B, MD   10 mg at 09/09/23 1550   oxyCODONE  (OXYCONTIN ) 12 hr tablet 15 mg  15 mg Oral Q12H Sreenath, Sudheer B, MD       pantoprazole  (PROTONIX ) EC tablet 40 mg  40 mg Oral BID Dew, Jason S, MD   40 mg at 09/09/23 9076   pregabalin  (LYRICA ) capsule 150 mg  150 mg Oral TID Dew, Jason S, MD   150 mg at 09/09/23 9076   protein supplement (ENSURE MAX) liquid  11 oz Oral QHS Dew, Jason S, MD   11 oz at 09/08/23 2123   tamsulosin  (FLOMAX ) capsule 0.4 mg  0.4 mg Oral QPC supper Dew, Jason S, MD   0.4 mg at 09/08/23 1654   zinc  sulfate (50mg  elemental zinc ) capsule 220 mg  220 mg Oral Daily Dew, Jason S, MD   220 mg at 09/09/23 9076     Discharge Medications: Please see discharge summary for a list of discharge medications.  Relevant Imaging Results:  Relevant Lab Results:   Additional Information SS  [id number]  Chere Babson C Ellyce Lafevers, RN

## 2023-09-09 NOTE — Progress Notes (Signed)
 PROGRESS NOTE    Russell [name]  FMW:[id number] DOB: 1966-01-16 DOA: 09/06/2023 PCP: Eliverto [name], MD    Brief Narrative:   HPI: Patient with PMH of decubitus ulcer of sacrum and left foot ulcer, left hand osteomyelitis S/P transradial amputation in 2/25, right BKA, PAD, history of GI bleed, history of a flutter, HTN, morbid obesity, OSA not on CPAP, depression, uncontrolled type II DM with neuropathy,chronic HFpEF, chronic back pain, presented to the hospital with foul-smelling wound infection on his left foot. 5/25 was seen at [facility] ER for left foot ulcer.  Was prescribed pain medication.  Was referred to podiatry.  Also had difficulty with urination and was referred to urology. Since then the patient has been doing dressing changes.  Has not seen podiatry or urology.  At [facility] health. Presents with concern that the 1 ulcer that he had in his foot is now turning into multiple ulcers and they are getting worse.  Denies any fever or chills.  No nausea no vomiting.  No fall no trauma no injury reported recently. Had a fall 4 months ago since then he reports he has some chronic sacral pain and unable to sit up in the wheelchair and has been laying on his side. Has not seen PCP as well lately. Ran out of his insulin  and is only using [employer] brand short acting insulin . Reports dietary indiscretion with regards to his diabetes and therefore reports he has to occasionally use 80 units of insulin  a day. He reports that he has chronic neuropathy and does not feel any sensation in his legs below the knee.  If I break my leg, I will not feel it. He feels like he has some shortness of breath secondary to swelling. He does not use CPAP at nighttime.  Does not use oxygen at home. Denies any SI.     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 Concern for soft tissue gas. Discussed with podiatry Dr. Ashley.  Appreciate consultation. Recommend MRI foot. CK mildly elevated.  655 ESR 92.  CRP pending.  Meeting SIRS criteria with leukocytosis and tachypnea with evidence of infection in her foot. Most likely will require amputation.  Patient is aware. Plan: Status post AKA.  Postoperative day #2.  Tough time with pain control.  Patient appears opioid tolerant.  Will add long-acting narcotics in addition to scheduled and as needed immediate release pain medications.  Vascular surgery contacted.  Will perform first dressing change over the weekend.  Will need skilled nursing facility.  Continue antibiotics as ordered.  7-day course.  Sacral decubitus ulcer. Wound care consult. Continue foam dressing.   Uncontrolled type 2 diabetes mellitus with hyperglycemia, with diabetic neuropathy as well as diabetic foot infection. On Lyrica  at home.  Will continue although low threshold to reduce the dose given his sleep apnea..  Hemoglobin A1c greater than 10.  Poor control. Glycemic  control improved over interval Plan: Continue basal bolus regimen Augment to achieve blood glucose 140-180 Continue carb modified diet  Morbid obesity. Body mass index is 44.63 kg/m.  Obesity hypoventilation syndrome. Noncompliant with CPAP. Suspected chronic respiratory failure in the setting of OSA. Placing the patient at high risk for poor outcome. Patient reports that he is losing weight on weight but  reports diet indiscretion. Recommend outpatient follow-up with PCP. Ordered CPAP however patient refused Will need outpatient sleep study   HLD. On statin. Continue to hold.   Consider restarting 7/10   PAD. Continue aspirin    History of atrial flutter. History of GI bleed. No reoccurrence of atrial flutter per chart review. Had multiple admission with GI bleed therefore not on any anticoagulation. Continue to monitor   Depression. Patient is on Cymbalta , Wellbutrin . Continue Cymbalta    Chronic pain syndrome. Home regimen includes OxyContin  10 mg twice daily as well as oxycodone  as needed. Continue home regimen.  Augment as necessary to achieve pain control    DVT prophylaxis: SQ Lovenox  Code Status: Full Family Communication: None Disposition Plan: Status is: Inpatient Remains inpatient appropriate because: Gangrenous lower extremity.  Status post amputation.  Will need skilled nursing facility.   Level of care: Progressive  Consultants:  Vascular surgery  Procedures:  AKA 7/9  Antimicrobials: Zosyn  Zyvox    Subjective: Examined.  Pain moderate to severe.  Reports phantom limb pain  Objective: Vitals:   09/08/23 2322 09/09/23 0429 09/09/23 0750 09/09/23 1151  BP: 115/62 131/63 123/72 (!) 105/50  Pulse: 80 78 81 83  Resp: 20 20    Temp: 97.8 F (36.6 C) 97.6 F (36.4 C) 98 F (36.7 C) 97.9 F (36.6 C)  TempSrc:    Oral  SpO2: 96% 91% 93% 95%  Weight:      Height:        Intake/Output Summary (Last 24 hours) at 09/09/2023 1341 Last data filed at 09/09/2023 1057 Gross per 24 hour  Intake 900 ml  Output 1400 ml  Net -500 ml   Filed Weights   09/06/23 0633 09/06/23 1940 09/07/23 1115  Weight: (!) 145.2 kg (!) 155.2 kg (!) 155.1 kg    Examination:  General exam: Distressed due to pain Respiratory system: Lungs clear.  Normal work of breathing.  Room air Cardiovascular system: S1 S2, RRR, no murmurs Gastrointestinal system: Obese, Soft,  NT/ND, normal bowel sounds Central nervous system: Alert and oriented. No focal neurological deficits. Extremities: Right upper extremity amputation, right lower extremity BKA, left lower extremity status post AKA Skin: No rashes, lesions or ulcers Psychiatry: Judgement and insight appear normal. Mood & affect appropriate.     Data Reviewed: I have personally reviewed following labs and imaging studies  CBC: Recent Labs  Lab 09/06/23 0648 09/07/23 0412 09/07/23 2047 09/08/23 0405  WBC 14.6* 11.2* 10.4 12.1*  NEUTROABS 13.0*  --   --   --   HGB 9.0* 8.0* 8.6* 7.6*  HCT 29.8* 26.5* 27.9* 24.2*  MCV 69.1* 69.0* 73.6* 72.9*  PLT 260 241 256 258   Basic Metabolic Panel: Recent Labs  Lab 09/06/23 0648 09/06/23 1018 09/06/23 2016 09/07/23 0412 09/08/23 0918  NA 129* 130* 129* 132* 130*  K 4.6 4.3 3.9 3.7 4.7  CL 96* 98 97* 98 99  CO2 25 23 23 23 24   GLUCOSE 540* 506* 415* 189* 362*  BUN 24* 24* 26* 26* 29*  CREATININE 1.12 1.10 1.00 1.08 1.21  CALCIUM  8.4* 8.0* 8.1* 8.1* 7.5*   GFR:  Estimated Creatinine Clearance: 99.7 mL/min (by C-G formula based on SCr of 1.21 mg/dL). Liver Function Tests: Recent Labs  Lab 09/06/23 0648  AST 36  ALT 18  ALKPHOS 112  BILITOT 0.5  PROT 7.5  ALBUMIN  2.4*   No results for input(s): LIPASE, AMYLASE in the last 168 hours. No results for input(s): AMMONIA in the last 168 hours. Coagulation Profile: Recent Labs  Lab 09/06/23 0648 09/06/23 1020  INR 1.4* 1.4*   Cardiac Enzymes: Recent Labs  Lab 09/06/23 1020  CKTOTAL 655*   BNP (last 3 results) No results for input(s): PROBNP in the last 8760 hours. HbA1C: No results for input(s): HGBA1C in the last 72 hours.  CBG: Recent Labs  Lab 09/08/23 1526 09/08/23 2009 09/08/23 2303 09/09/23 0355 09/09/23 0750  GLUCAP 339* 201* 130* 102* 123*   Lipid Profile: No results for input(s): CHOL, HDL, LDLCALC, TRIG, CHOLHDL, LDLDIRECT in the last 72  hours. Thyroid  Function Tests: No results for input(s): TSH, T4TOTAL, FREET4, T3FREE, THYROIDAB in the last 72 hours. Anemia Panel: No results for input(s): VITAMINB12, FOLATE, FERRITIN, TIBC, IRON , RETICCTPCT in the last 72 hours. Sepsis Labs: Recent Labs  Lab 09/06/23 0648  PROCALCITON 3.40  LATICACIDVEN 1.8    Recent Results (from the past 240 hours)  Culture, blood (Routine x 2)     Status: None (Preliminary result)   Collection Time: 09/06/23  6:48 AM   Specimen: BLOOD RIGHT ARM  Result Value Ref Range Status   Specimen Description BLOOD RIGHT ARM  Final   Special Requests   Final    BOTTLES DRAWN AEROBIC AND ANAEROBIC Blood Culture adequate volume   Culture   Final    NO GROWTH 3 DAYS Performed at [facility], [address] [place], [place] [address]    Report Status PENDING  Incomplete  Culture, blood (Routine x 2)     Status: None (Preliminary result)   Collection Time: 09/06/23  9:16 AM   Specimen: BLOOD  Result Value Ref Range Status   Specimen Description BLOOD BLOOD RIGHT WRIST  Final   Special Requests   Final    BOTTLES DRAWN AEROBIC AND ANAEROBIC Blood Culture adequate volume   Culture   Final    NO GROWTH 3 DAYS Performed at [facility], [address], [place], [place] [address]    Report Status PENDING  Incomplete  Aerobic Culture w Gram Stain (superficial specimen)     Status: None (Preliminary result)   Collection Time: 09/06/23 10:18 AM   Specimen: Foot; Wound  Result Value Ref Range Status   Specimen Description   Final    FOOT Performed at [facility], [address], [place], [place] [address]    Special Requests   Final    LT FOOT Performed at [facility], [address] [place], [place] [address]    Gram Stain   Final    NO WBC SEEN RARE GRAM NEGATIVE RODS FEW GRAM POSITIVE COCCI IN PAIRS    Culture   Final    ABUNDANT PROTEUS MIRABILIS ABUNDANT STAPHYLOCOCCUS  AUREUS ABUNDANT ENTEROCOCCUS FAECALIS CULTURE REINCUBATED FOR BETTER GROWTH SUSCEPTIBILITIES TO FOLLOW Performed at [facility], [address] [place], [place] [address]    Report Status PENDING  Incomplete   Organism ID, Bacteria PROTEUS MIRABILIS  Final      Susceptibility   Proteus mirabilis - MIC*    AMPICILLIN  <=2 SENSITIVE Sensitive     CEFEPIME  <=0.12 SENSITIVE Sensitive  CEFTAZIDIME <=1 SENSITIVE Sensitive     CEFTRIAXONE  <=0.25 SENSITIVE Sensitive     CIPROFLOXACIN  <=0.25 SENSITIVE Sensitive     GENTAMICIN <=1 SENSITIVE Sensitive     IMIPENEM 2 SENSITIVE Sensitive     TRIMETH/SULFA <=20 SENSITIVE Sensitive     AMPICILLIN /SULBACTAM <=2 SENSITIVE Sensitive     PIP/TAZO <=4 SENSITIVE Sensitive ug/mL    * ABUNDANT PROTEUS MIRABILIS  MRSA Next Gen by PCR, Nasal     Status: Abnormal   Collection Time: 09/07/23  4:41 AM   Specimen: Nasal Mucosa; Nasal Swab  Result Value Ref Range Status   MRSA by PCR Next Gen DETECTED (A) NOT DETECTED Final    Comment: RESULT CALLED TO, READ BACK BY AND VERIFIED WITH: ALLEAN BROCKS RN [phone] 09/07/23 HNM (NOTE) The GeneXpert MRSA Assay (FDA approved for NASAL specimens only), is one component of a comprehensive MRSA colonization surveillance program. It is not intended to diagnose MRSA infection nor to guide or monitor treatment for MRSA infections. Test performance is not FDA approved in patients less than 43 years old. Performed at [facility], [address], [place], [place] [address]          Radiology Studies: No results found.       Scheduled Meds:  vitamin C   500 mg Oral BID   aspirin  EC  81 mg Oral Daily   atorvastatin   40 mg Oral Daily   Chlorhexidine  Gluconate Cloth  6 each Topical Daily   DULoxetine   90 mg Oral Daily   enoxaparin  (LOVENOX ) injection  70 mg Subcutaneous Q24H   finasteride   5 mg Oral Daily   insulin  aspart  0-20 Units Subcutaneous Q4H   insulin  aspart  5 Units Subcutaneous TID WC    insulin  glargine-yfgn  20 Units Subcutaneous QHS   methocarbamol   750 mg Oral QID   multivitamin with minerals  1 tablet Oral Daily   mupirocin  ointment  1 Application Nasal BID   nutrition supplement (JUVEN)  1 packet Oral BID BM   oxyCODONE   15 mg Oral Q12H   pantoprazole   40 mg Oral BID   pregabalin   150 mg Oral TID   Ensure Max Protein  11 oz Oral QHS   tamsulosin   0.4 mg Oral QPC supper   zinc  sulfate (50mg  elemental zinc )  220 mg Oral Daily   Continuous Infusions:  ampicillin -sulbactam (UNASYN ) IV 3 g (09/09/23 0922)     LOS: 3 days     Calvin KATHEE Robson, MD [facility]   If 7PM-7AM, please contact night-coverage  09/09/2023, 1:41 PM

## 2023-09-09 NOTE — Inpatient Diabetes Management (Signed)
 Inpatient Diabetes Program Recommendations  AACE/ADA: New Consensus Statement on Inpatient Glycemic Control (2015)  Target Ranges:  Prepandial:   less than 140 mg/dL      Peak postprandial:   less than 180 mg/dL (1-2 hours)      Critically ill patients:  140 - 180 mg/dL   Lab Results  Component Value Date   GLUCAP 123 (H) 09/09/2023   HGBA1C 10.1 (H) 09/06/2023    Review of Glycemic Control  Latest Reference Range & Units 09/08/23 15:26 09/08/23 20:09 09/08/23 23:03 09/09/23 03:55 09/09/23 07:50  Glucose-Capillary 70 - 99 mg/dL 660 (H) 798 (H) 869 (H) 102 (H) 123 (H)  Diabetes history: DM 2 Outpatient Diabetes medications:  Semglee  20 units daily Novolog  prn  Current orders for Inpatient glycemic control:  Novolog  0-20 units q 4 hours Semglee  20 units q HS Novolog  5 units tid with meals   Inpatient Diabetes Program Recommendations:    CBG's improved today.  Spoke to patient on 7/10- He is interested in CGM and also needs refills for both basal and rapid acting insulins.  Patient will likely require rehabilitation prior to d/c home alone.  CGM is available at $0 copay.   Will let patient know.    Thanks,  Randall Bullocks, RN, BC-ADM Inpatient Diabetes Coordinator Pager [phone]  (8a-5p)

## 2023-09-09 NOTE — Progress Notes (Signed)
 Progress Note    09/09/2023 10:54 AM 2 Days Post-Op  Subjective:  Russell [name] is a 58 year old male who is now postop day #2 left above-the-knee amputation. Patient was sleeping when I arrived to examine him this morning. He endorses pain to the incision site but is being managed well with pain medication. No other complaints overnight. Vitals all remained stable.    Vitals:   09/09/23 0429 09/09/23 0750  BP: 131/63 123/72  Pulse: 78 81  Resp: 20   Temp: 97.6 F (36.4 C) 98 F (36.7 C)  SpO2: 91% 93%   Physical Exam: Cardiac:  RRR, normal S1 and S2.  No rubs clicks gallops or murmurs noted. Lungs: Nonlabored breathing, clear throughout auscultation but diminished bases.  No rales rhonchi or wheezing noted Incisions: Left above-the-knee with dressing clean dry and intact Extremities: Patient has a history of right BKA left upper arm below the elbow amputation wound left above-the-knee amputation.  Patient's right upper extremity with palpable pulses. Abdomen: Positive bowel sounds throughout, morbid obesity with soft abdomen patient. Neurologic: Alert and oriented x 3, questions follows commands appropriately.  CBC    Component Value Date/Time   WBC 12.1 (H) 09/08/2023 0405   RBC 3.32 (L) 09/08/2023 0405   HGB 7.6 (L) 09/08/2023 0405   HGB 12.6 (L) 06/13/2014 0421   HCT 24.2 (L) 09/08/2023 0405   HCT 39.0 (L) 06/13/2014 0421   PLT 258 09/08/2023 0405   PLT 190 06/13/2014 0421   MCV 72.9 (L) 09/08/2023 0405   MCV 80 06/13/2014 0421   MCH 22.9 (L) 09/08/2023 0405   MCHC 31.4 09/08/2023 0405   RDW 18.6 (H) 09/08/2023 0405   RDW 15.1 (H) 06/13/2014 0421   LYMPHSABS 0.7 09/06/2023 0648   LYMPHSABS 2.1 06/13/2014 0421   MONOABS 0.7 09/06/2023 0648   MONOABS 0.8 06/13/2014 0421   EOSABS 0.0 09/06/2023 0648   EOSABS 0.4 06/13/2014 0421   BASOSABS 0.0 09/06/2023 0648   BASOSABS 0.1 06/13/2014 0421    BMET    Component Value Date/Time   NA 130 (L) 09/08/2023 0918    NA 136 06/13/2014 0421   K 4.7 09/08/2023 0918   K 4.0 06/13/2014 0421   CL 99 09/08/2023 0918   CL 100 (L) 06/13/2014 0421   CO2 24 09/08/2023 0918   CO2 29 06/13/2014 0421   GLUCOSE 362 (H) 09/08/2023 0918   GLUCOSE 143 (H) 06/13/2014 0421   BUN 29 (H) 09/08/2023 0918   BUN 22 (H) 06/13/2014 0421   CREATININE 1.21 09/08/2023 0918   CREATININE 0.87 06/13/2014 0421   CALCIUM  7.5 (L) 09/08/2023 0918   CALCIUM  8.6 (L) 06/13/2014 0421   GFRNONAA >60 09/08/2023 0918   GFRNONAA >60 06/13/2014 0421   GFRAA >60 06/03/2018 0014   GFRAA >60 06/13/2014 0421    INR    Component Value Date/Time   INR 1.4 (H) 09/06/2023 1020   INR 0.9 06/05/2014 2241     Intake/Output Summary (Last 24 hours) at 09/09/2023 1054 Last data filed at 09/09/2023 0900 Gross per 24 hour  Intake 900 ml  Output 1050 ml  Net -150 ml     Assessment/Plan:  58 y.o. male is s/p left above-the-knee amputation 2 Days Post-Op   PLAN Advance diet as tolerated. Patient having difficulty with pain this morning.  Hospitalist notified and adjusting to long-term acting pain medication.  Pain medication as needed First dressing change will be done over the weekend by vascular surgery. Plan is for patient to  go back to his previous living arrangements.   DVT prophylaxis: Lovenox  70 mg subcu every 24 hours    Russell [name] [facility] 09/09/2023 10:54 AM

## 2023-09-10 ENCOUNTER — Inpatient Hospital Stay

## 2023-09-10 DIAGNOSIS — M86172 Other acute osteomyelitis, left ankle and foot: Secondary | ICD-10-CM | POA: Diagnosis not present

## 2023-09-10 LAB — GLUCOSE, CAPILLARY
Glucose-Capillary: 100 mg/dL — ABNORMAL HIGH (ref 70–99)
Glucose-Capillary: 120 mg/dL — ABNORMAL HIGH (ref 70–99)
Glucose-Capillary: 126 mg/dL — ABNORMAL HIGH (ref 70–99)
Glucose-Capillary: 127 mg/dL — ABNORMAL HIGH (ref 70–99)
Glucose-Capillary: 127 mg/dL — ABNORMAL HIGH (ref 70–99)
Glucose-Capillary: 131 mg/dL — ABNORMAL HIGH (ref 70–99)
Glucose-Capillary: 94 mg/dL (ref 70–99)

## 2023-09-10 LAB — AEROBIC CULTURE W GRAM STAIN (SUPERFICIAL SPECIMEN): Gram Stain: NONE SEEN

## 2023-09-10 NOTE — Plan of Care (Signed)
  Problem: Nutritional: Goal: Maintenance of adequate nutrition will improve Outcome: Progressing   Problem: Education: Goal: Ability to describe self-care measures that may prevent or decrease complications (Diabetes Survival Skills Education) will improve Outcome: Not Progressing   Problem: Coping: Goal: Ability to adjust to condition or change in health will improve Outcome: Not Progressing   Problem: Health Behavior/Discharge Planning: Goal: Ability to identify and utilize available resources and services will improve Outcome: Not Progressing Goal: Ability to manage health-related needs will improve Outcome: Not Progressing   Problem: Skin Integrity: Goal: Risk for impaired skin integrity will decrease Outcome: Not Progressing   Problem: Tissue Perfusion: Goal: Adequacy of tissue perfusion will improve Outcome: Not Progressing   Problem: Education: Goal: Knowledge of General Education information will improve Description: Including pain rating scale, medication(s)/side effects and non-pharmacologic comfort measures Outcome: Not Progressing

## 2023-09-10 NOTE — Progress Notes (Signed)
 Dr. Jhonny notified of pt behaviors, pt attempting to transfer to [facility] while clearly not able to-stating he is going to transfer anyways despite RN and CNA uncomfortable with attempt. Pt also had a situation earlier in the morning where he stated he almost had a fall while dangling on the side of the bed and called to let secretary know. Pt agitated/yelling and asking why can't we lift him onto [facility] or find a strong man to lift him onto [facility]. Pt reminded multiple times that he is a high fall risk with everything he has going on/reduced ambulation and strength.  Pt still attempting to transfer so provider was notified of unsafe situation and 1:1 safety sitter ordered for safety of pt and staff. Pt was placed on bedpan and situation de-escalated. Will continue to monitor and provide safety as needed.  Yaneth Fairbairn S Elmer Boutelle

## 2023-09-10 NOTE — Progress Notes (Signed)
 [facility]  Daily Progress Note   Subjective  -   Says left AKA stump feel heavy.  Talks about pain but describes it as hard to lift not painful to lift.  Objective Vitals:   09/09/23 2333 09/10/23 0338 09/10/23 0801 09/10/23 1140  BP: 136/66 128/70 128/68 (!) 120/43  Pulse: 81 72 76 76  Resp: 20 20 19    Temp: 97.6 F (36.4 C) 97.6 F (36.4 C) 97.8 F (36.6 C) 98 F (36.7 C)  TempSrc:   Oral   SpO2: 100% 97% 92% 100%  Weight:      Height:        Intake/Output Summary (Last 24 hours) at 09/10/2023 1527 Last data filed at 09/10/2023 1030 Gross per 24 hour  Intake 180 ml  Output 1500 ml  Net -1320 ml    PULM  CTAB CV  RRR VASC  Dressing intact.  No strike through.  Leg warm  Laboratory CBC    Component Value Date/Time   WBC 12.1 (H) 09/08/2023 0405   HGB 7.6 (L) 09/08/2023 0405   HGB 12.6 (L) 06/13/2014 0421   HCT 24.2 (L) 09/08/2023 0405   HCT 39.0 (L) 06/13/2014 0421   PLT 258 09/08/2023 0405   PLT 190 06/13/2014 0421    BMET    Component Value Date/Time   NA 130 (L) 09/08/2023 0918   NA 136 06/13/2014 0421   K 4.7 09/08/2023 0918   K 4.0 06/13/2014 0421   CL 99 09/08/2023 0918   CL 100 (L) 06/13/2014 0421   CO2 24 09/08/2023 0918   CO2 29 06/13/2014 0421   GLUCOSE 362 (H) 09/08/2023 0918   GLUCOSE 143 (H) 06/13/2014 0421   BUN 29 (H) 09/08/2023 0918   BUN 22 (H) 06/13/2014 0421   CREATININE 1.21 09/08/2023 0918   CREATININE 0.87 06/13/2014 0421   CALCIUM  7.5 (L) 09/08/2023 0918   CALCIUM  8.6 (L) 06/13/2014 0421   GFRNONAA >60 09/08/2023 0918   GFRNONAA >60 06/13/2014 0421   GFRAA >60 06/03/2018 0014   GFRAA >60 06/13/2014 0421    Assessment/Planning: POD #3 s/p L AKA  Plan to get Ioban from OR to replace current dressing tomorrow.  He is good with the plan.   Norleen LITTIE Pereyra  09/10/2023, 3:27 PM

## 2023-09-10 NOTE — Plan of Care (Signed)

## 2023-09-10 NOTE — Progress Notes (Signed)
 PROGRESS NOTE    Russell [name]  FMW:[id number] DOB: [birth year] DOA: 09/06/2023 PCP: Eliverto [name], MD    Brief Narrative:   HPI: Patient with PMH of decubitus ulcer of sacrum and left foot ulcer, left hand osteomyelitis S/P transradial amputation in 2/25, right BKA, PAD, history of GI bleed, history of a flutter, HTN, morbid obesity, OSA not on CPAP, depression, uncontrolled type II DM with neuropathy,chronic HFpEF, chronic back pain, presented to the hospital with foul-smelling wound infection on his left foot. 5/25 was seen at [facility] ER for left foot ulcer.  Was prescribed pain medication.  Was referred to podiatry.  Also had difficulty with urination and was referred to urology. Since then the patient has been doing dressing changes.  Has not seen podiatry or urology.  At [facility] health. Presents with concern that the 1 ulcer that he had in his foot is now turning into multiple ulcers and they are getting worse.  Denies any fever or chills.  No nausea no vomiting.  No fall no trauma no injury reported recently. Had a fall 4 months ago since then he reports he has some chronic sacral pain and unable to sit up in the wheelchair and has been laying on his side. Has not seen PCP as well lately. Ran out of his insulin  and is only using [employer] brand short acting insulin . Reports dietary indiscretion with regards to his diabetes and therefore reports he has to occasionally use 80 units of insulin  a day. He reports that he has chronic neuropathy and does not feel any sensation in his legs below the knee.  If I break my leg, I will not feel it. He feels like he has some shortness of breath secondary to swelling. He does not use CPAP at nighttime.  Does not use oxygen at home. Denies any SI.     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 Concern for soft tissue gas. Discussed with podiatry Dr. Ashley.  Appreciate consultation. Recommend MRI foot. CK mildly elevated.  655 ESR 92.  CRP pending.  Meeting SIRS criteria with leukocytosis and tachypnea with evidence of infection in her foot. Most likely will require amputation.  Patient is aware. Plan: Status post AKA.  Postoperative day #3.  Pain control is been quite challenging.  Patient attempting to get out of bed and is major fall risk.  Secondary school teacher ordered.  Continue current narcotic and nonnarcotic pain regimen.  Patient appears quite opioid tolerant.  Continue antibiotics as ordered.  7-day course.   Sacral decubitus ulcer. Wound care consult. Continue foam dressing.   Uncontrolled type 2 diabetes mellitus with hyperglycemia, with diabetic neuropathy as well as diabetic foot infection. On Lyrica  at home.  Will continue although low threshold to reduce the dose given his sleep apnea..  Hemoglobin A1c greater than 10.  Poor control. Glycemic control improved over interval Plan: Continue basal  bolus regimen Augment to achieve blood glucose 140-180 Continue carb modified diet Improve glycemic control over interval  Morbid obesity. Body mass index is 44.63 kg/m.  Obesity hypoventilation syndrome. Noncompliant with CPAP. Suspected chronic respiratory failure in the setting of OSA. Placing the patient at high risk for poor outcome. Patient reports that he is losing weight on weight but reports diet  indiscretion. Recommend outpatient follow-up with PCP. Ordered CPAP however patient refused Will need outpatient sleep study   HLD. On statin. Can resume statin   PAD. Continue aspirin    History of atrial flutter. History of GI bleed. No reoccurrence of atrial flutter per chart review. Had multiple admission with GI bleed therefore not on any anticoagulation. Continue to monitor   Depression. Patient is on Cymbalta , Wellbutrin . Continue Cymbalta    Chronic pain syndrome. Home regimen includes OxyContin  10 mg twice daily as well as oxycodone  as needed. Continue home regimen.  Augment as necessary to achieve pain control    DVT prophylaxis: SQ Lovenox  Code Status: Full Family Communication: None Disposition Plan: Status is: Inpatient Remains inpatient appropriate because: Gangrenous lower extremity.  Status post amputation.  Will need skilled nursing facility.   Level of care: Progressive  Consultants:  Vascular surgery  Procedures:  AKA 7/9  Antimicrobials: Zosyn  Zyvox    Subjective: Seen and examined.  Very uncomfortable this morning.  Objective: Vitals:   09/09/23 2333 09/10/23 0338 09/10/23 0801 09/10/23 1140  BP: 136/66 128/70 128/68 (!) 120/43  Pulse: 81 72 76 76  Resp: 20 20 19    Temp: 97.6 F (36.4 C) 97.6 F (36.4 C) 97.8 F (36.6 C) 98 F (36.7 C)  TempSrc:   Oral   SpO2: 100% 97% 92% 100%  Weight:      Height:        Intake/Output Summary (Last 24 hours) at 09/10/2023 1358 Last data filed at 09/10/2023 1030 Gross per 24 hour  Intake 380 ml  Output 1800 ml  Net -1420 ml   Filed Weights   09/06/23 0633 09/06/23 1940 09/07/23 1115  Weight: (!) 145.2 kg (!) 155.2 kg (!) 155.1 kg    Examination:  General exam: Distressed due to pain Respiratory system: Lungs clear.  Normal work of breathing.  Room air Cardiovascular system: S1 S2, RRR, no murmurs Gastrointestinal system: Obese, Soft, NT/ND, normal bowel sounds Central nervous  system: Alert and oriented. No focal neurological deficits. Extremities: Right upper extremity amputation, right lower extremity BKA, left lower extremity status post AKA Skin: No rashes, lesions or ulcers Psychiatry: Judgement and insight appear normal. Mood & affect appropriate.     Data Reviewed: I have personally reviewed following labs and imaging studies  CBC: Recent Labs  Lab 09/06/23 0648 09/07/23 0412 09/07/23 2047 09/08/23 0405  WBC 14.6* 11.2* 10.4 12.1*  NEUTROABS 13.0*  --   --   --   HGB 9.0* 8.0* 8.6* 7.6*  HCT 29.8* 26.5* 27.9* 24.2*  MCV 69.1* 69.0* 73.6* 72.9*  PLT 260 241 256 258   Basic Metabolic Panel: Recent Labs  Lab 09/06/23 0648 09/06/23 1018 09/06/23 2016 09/07/23 0412 09/08/23 0918  NA 129* 130* 129* 132* 130*  K 4.6 4.3 3.9 3.7 4.7  CL 96* 98 97* 98 99  CO2 25 23 23 23 24   GLUCOSE 540* 506* 415* 189* 362*  BUN 24* 24* 26* 26* 29*  CREATININE 1.12 1.10 1.00 1.08 1.21  CALCIUM  8.4* 8.0* 8.1* 8.1* 7.5*   GFR: Estimated Creatinine Clearance: 99.7 mL/min (by C-G formula based on  SCr of 1.21 mg/dL). Liver Function Tests: Recent Labs  Lab 09/06/23 0648  AST 36  ALT 18  ALKPHOS 112  BILITOT 0.5  PROT 7.5  ALBUMIN  2.4*   No results for input(s): LIPASE, AMYLASE in the last 168 hours. No results for input(s): AMMONIA in the last 168 hours. Coagulation Profile: Recent Labs  Lab 09/06/23 0648 09/06/23 1020  INR 1.4* 1.4*   Cardiac Enzymes: Recent Labs  Lab 09/06/23 1020  CKTOTAL 655*   BNP (last 3 results) No results for input(s): PROBNP in the last 8760 hours. HbA1C: No results for input(s): HGBA1C in the last 72 hours.  CBG: Recent Labs  Lab 09/09/23 2039 09/10/23 0016 09/10/23 0339 09/10/23 0759 09/10/23 1129  GLUCAP 112* 127* 131* 127* 120*   Lipid Profile: No results for input(s): CHOL, HDL, LDLCALC, TRIG, CHOLHDL, LDLDIRECT in the last 72 hours. Thyroid  Function Tests: No results for  input(s): TSH, T4TOTAL, FREET4, T3FREE, THYROIDAB in the last 72 hours. Anemia Panel: No results for input(s): VITAMINB12, FOLATE, FERRITIN, TIBC, IRON , RETICCTPCT in the last 72 hours. Sepsis Labs: Recent Labs  Lab 09/06/23 0648  PROCALCITON 3.40  LATICACIDVEN 1.8    Recent Results (from the past 240 hours)  Culture, blood (Routine x 2)     Status: None (Preliminary result)   Collection Time: 09/06/23  6:48 AM   Specimen: BLOOD RIGHT ARM  Result Value Ref Range Status   Specimen Description BLOOD RIGHT ARM  Final   Special Requests   Final    BOTTLES DRAWN AEROBIC AND ANAEROBIC Blood Culture adequate volume   Culture   Final    NO GROWTH 4 DAYS Performed at [facility], [address], [place], [place] [address]    Report Status PENDING  Incomplete  Culture, blood (Routine x 2)     Status: None (Preliminary result)   Collection Time: 09/06/23  9:16 AM   Specimen: BLOOD  Result Value Ref Range Status   Specimen Description BLOOD BLOOD RIGHT WRIST  Final   Special Requests   Final    BOTTLES DRAWN AEROBIC AND ANAEROBIC Blood Culture adequate volume   Culture   Final    NO GROWTH 4 DAYS Performed at [facility], [address], [place], [place] [address]    Report Status PENDING  Incomplete  Aerobic Culture w Gram Stain (superficial specimen)     Status: None   Collection Time: 09/06/23 10:18 AM   Specimen: Foot; Wound  Result Value Ref Range Status   Specimen Description   Final    FOOT Performed at [facility], [address] [place], [place] [address]    Special Requests   Final    LT FOOT Performed at [facility], [address] [place], [place] [address]    Gram Stain   Final    NO WBC SEEN RARE GRAM NEGATIVE RODS FEW GRAM POSITIVE COCCI IN PAIRS Performed at [facility], [address], [place], [place] [address]    Culture   Final    ABUNDANT PROTEUS MIRABILIS ABUNDANT METHICILLIN  RESISTANT STAPHYLOCOCCUS AUREUS ABUNDANT ENTEROCOCCUS FAECALIS ESCHERICHIA COLI Confirmed Extended Spectrum Beta-Lactamase Producer (ESBL).  In bloodstream infections from ESBL organisms, carbapenems are preferred over piperacillin /tazobactam. They are shown to have a lower risk of mortality.    Report Status 09/10/2023 FINAL  Final   Organism ID, Bacteria PROTEUS MIRABILIS  Final   Organism ID, Bacteria METHICILLIN RESISTANT STAPHYLOCOCCUS AUREUS  Final   Organism ID,  Bacteria ENTEROCOCCUS FAECALIS  Final   Organism ID, Bacteria ESCHERICHIA COLI  Final      Susceptibility   Escherichia coli - MIC*    AMPICILLIN  >=32 RESISTANT Resistant     CEFEPIME  8 INTERMEDIATE Intermediate     CEFTAZIDIME RESISTANT Resistant     CEFTRIAXONE  >=64 RESISTANT Resistant     CIPROFLOXACIN  >=4 RESISTANT Resistant     GENTAMICIN <=1 SENSITIVE Sensitive     IMIPENEM <=0.25 SENSITIVE Sensitive     TRIMETH/SULFA <=20 SENSITIVE Sensitive     AMPICILLIN /SULBACTAM >=32 RESISTANT Resistant     PIP/TAZO 8 SENSITIVE Sensitive ug/mL    * ESCHERICHIA COLI   Enterococcus faecalis - MIC*    AMPICILLIN  <=2 SENSITIVE Sensitive     VANCOMYCIN  2 SENSITIVE Sensitive     GENTAMICIN SYNERGY SENSITIVE Sensitive     * ABUNDANT ENTEROCOCCUS FAECALIS   Methicillin resistant staphylococcus aureus - MIC*    CIPROFLOXACIN  >=8 RESISTANT Resistant     ERYTHROMYCIN >=8 RESISTANT Resistant     GENTAMICIN <=0.5 SENSITIVE Sensitive     OXACILLIN >=4 RESISTANT Resistant     TETRACYCLINE <=1 SENSITIVE Sensitive     VANCOMYCIN  <=0.5 SENSITIVE Sensitive     TRIMETH/SULFA >=320 RESISTANT Resistant     CLINDAMYCIN  <=0.25 SENSITIVE Sensitive     RIFAMPIN <=0.5 SENSITIVE Sensitive     Inducible Clindamycin  NEGATIVE Sensitive     LINEZOLID  2 SENSITIVE Sensitive     * ABUNDANT METHICILLIN RESISTANT STAPHYLOCOCCUS AUREUS   Proteus mirabilis - MIC*    AMPICILLIN  <=2 SENSITIVE Sensitive     CEFEPIME  <=0.12 SENSITIVE Sensitive      CEFTAZIDIME <=1 SENSITIVE Sensitive     CEFTRIAXONE  <=0.25 SENSITIVE Sensitive     CIPROFLOXACIN  <=0.25 SENSITIVE Sensitive     GENTAMICIN <=1 SENSITIVE Sensitive     IMIPENEM 2 SENSITIVE Sensitive     TRIMETH/SULFA <=20 SENSITIVE Sensitive     AMPICILLIN /SULBACTAM <=2 SENSITIVE Sensitive     PIP/TAZO <=4 SENSITIVE Sensitive ug/mL    * ABUNDANT PROTEUS MIRABILIS  MRSA Next Gen by PCR, Nasal     Status: Abnormal   Collection Time: 09/07/23  4:41 AM   Specimen: Nasal Mucosa; Nasal Swab  Result Value Ref Range Status   MRSA by PCR Next Gen DETECTED (A) NOT DETECTED Final    Comment: RESULT CALLED TO, READ BACK BY AND VERIFIED WITH: ALLEAN BROCKS RN [phone] 09/07/23 HNM (NOTE) The GeneXpert MRSA Assay (FDA approved for NASAL specimens only), is one component of a comprehensive MRSA colonization surveillance program. It is not intended to diagnose MRSA infection nor to guide or monitor treatment for MRSA infections. Test performance is not FDA approved in patients less than 88 years old. Performed at [facility], [address] [place], [place] [address]          Radiology Studies: DG Abd 1 View Result Date: 09/10/2023 CLINICAL DATA:  Constipation. EXAM: ABDOMEN - 1 VIEW COMPARISON:  11/21/2020 FINDINGS: No evidence of dilated bowel loops. Solid food stuff noted in stomach. No significant colonic stool burden seen. IMPRESSION: No acute findings.  No significant colonic stool burden. Electronically Signed   By: Norleen DELENA Kil M.D.   On: 09/10/2023 11:40         Scheduled Meds:  vitamin C   500 mg Oral BID   aspirin  EC  81 mg Oral Daily   atorvastatin   40 mg Oral Daily   Chlorhexidine  Gluconate Cloth  6 each Topical Daily   DULoxetine   90 mg Oral  Daily   enoxaparin  (LOVENOX ) injection  70 mg Subcutaneous Q24H   finasteride   5 mg Oral Daily   insulin  aspart  0-20 Units Subcutaneous Q4H   insulin  aspart  5 Units Subcutaneous TID WC   insulin  glargine-yfgn  20 Units  Subcutaneous QHS   methocarbamol   750 mg Oral QID   multivitamin with minerals  1 tablet Oral Daily   mupirocin  ointment  1 Application Nasal BID   nutrition supplement (JUVEN)  1 packet Oral BID BM   oxyCODONE   15 mg Oral Q12H   pantoprazole   40 mg Oral BID   pregabalin   150 mg Oral TID   Ensure Max Protein  11 oz Oral QHS   tamsulosin   0.4 mg Oral QPC supper   zinc  sulfate (50mg  elemental zinc )  220 mg Oral Daily   Continuous Infusions:  ampicillin -sulbactam (UNASYN ) IV 3 g (09/10/23 0945)     LOS: 4 days     Calvin KATHEE Robson, MD [facility]   If 7PM-7AM, please contact night-coverage  09/10/2023, 1:58 PM

## 2023-09-11 DIAGNOSIS — M86172 Other acute osteomyelitis, left ankle and foot: Secondary | ICD-10-CM | POA: Diagnosis not present

## 2023-09-11 LAB — CULTURE, BLOOD (ROUTINE X 2)
Culture: NO GROWTH
Culture: NO GROWTH
Special Requests: ADEQUATE
Special Requests: ADEQUATE

## 2023-09-11 LAB — GLUCOSE, CAPILLARY
Glucose-Capillary: 108 mg/dL — ABNORMAL HIGH (ref 70–99)
Glucose-Capillary: 115 mg/dL — ABNORMAL HIGH (ref 70–99)
Glucose-Capillary: 198 mg/dL — ABNORMAL HIGH (ref 70–99)
Glucose-Capillary: 178 mg/dL — ABNORMAL HIGH (ref 70–99)
Glucose-Capillary: 150 mg/dL — ABNORMAL HIGH (ref 70–99)

## 2023-09-11 LAB — VITAMIN A: Vitamin A (Retinoic Acid): 22 ug/dL (ref 20.1–62.0)

## 2023-09-11 MED ORDER — BISACODYL 10 MG RE SUPP: 10.0000 mg | Freq: Every day | RECTAL | Status: DC | PRN

## 2023-09-11 MED ORDER — SENNOSIDES-DOCUSATE SODIUM 8.6-50 MG PO TABS
1.0000 | ORAL_TABLET | Freq: Two times a day (BID) | ORAL | Status: DC
  Administered 2023-09-11 – 2023-09-12 (×2): 1 via ORAL
  Filled 2023-09-11 (×4): qty 1

## 2023-09-11 MED ORDER — POLYETHYLENE GLYCOL 3350 17 G PO PACK
17.0000 g | PACK | Freq: Every day | ORAL | Status: DC
  Filled 2023-09-11 (×2): qty 1

## 2023-09-11 MED ORDER — FLEET ENEMA RE ENEM: 1.0000 | ENEMA | Freq: Every day | RECTAL | Status: DC | PRN

## 2023-09-11 NOTE — Progress Notes (Signed)
 [facility]  Daily Progress Note   Subjective  -   No new leg complaints overnight but when he was transferred back to bed after using the bedside commode he felt pain in the anterior thigh now resolved.  Still unhappy with the AKA but unable to describe why.  Objective Vitals:   09/11/23 0341 09/11/23 0838 09/11/23 0938 09/11/23 1307  BP: (!) 134/59 (!) 137/48 132/62 (!) 146/61  Pulse: 73 75 76 78  Resp: 16     Temp: 98 F (36.7 C) 98.3 F (36.8 C) 97.7 F (36.5 C) 98.5 F (36.9 C)  TempSrc:      SpO2: 96% 97% 95% 91%  Weight:      Height:        Intake/Output Summary (Last 24 hours) at 09/11/2023 1309 Last data filed at 09/11/2023 1000 Gross per 24 hour  Intake 695 ml  Output 2000 ml  Net -1305 ml    PULM  CTAB CV  RRR VASC  L AKA dressing removed and skin closure looks perfect.  This without apparent issues.  Painted with Betadine and dry dressing and Ioban applied to secure.  Laboratory CBC    Component Value Date/Time   WBC 12.1 (H) 09/08/2023 0405   HGB 7.6 (L) 09/08/2023 0405   HGB 12.6 (L) 06/13/2014 0421   HCT 24.2 (L) 09/08/2023 0405   HCT 39.0 (L) 06/13/2014 0421   PLT 258 09/08/2023 0405   PLT 190 06/13/2014 0421    BMET    Component Value Date/Time   NA 130 (L) 09/08/2023 0918   NA 136 06/13/2014 0421   K 4.7 09/08/2023 0918   K 4.0 06/13/2014 0421   CL 99 09/08/2023 0918   CL 100 (L) 06/13/2014 0421   CO2 24 09/08/2023 0918   CO2 29 06/13/2014 0421   GLUCOSE 362 (H) 09/08/2023 0918   GLUCOSE 143 (H) 06/13/2014 0421   BUN 29 (H) 09/08/2023 0918   BUN 22 (H) 06/13/2014 0421   CREATININE 1.21 09/08/2023 0918   CREATININE 0.87 06/13/2014 0421   CALCIUM  7.5 (L) 09/08/2023 0918   CALCIUM  8.6 (L) 06/13/2014 0421   GFRNONAA >60 09/08/2023 0918   GFRNONAA >60 06/13/2014 0421   GFRAA >60 06/03/2018 0014   GFRAA >60 06/13/2014 0421    Assessment/Planning: POD #4 s/p L AKA  Wife was present for dressing change and  agrees it looks excellent.   Russell [name]  09/11/2023, 1:09 PM

## 2023-09-11 NOTE — Plan of Care (Signed)
  Problem: Metabolic: Goal: Ability to maintain appropriate glucose levels will improve Outcome: Progressing   Problem: Clinical Measurements: Goal: Will remain free from infection Outcome: Progressing   Problem: Coping: Goal: Level of anxiety will decrease Outcome: Progressing

## 2023-09-11 NOTE — Plan of Care (Signed)

## 2023-09-11 NOTE — Progress Notes (Signed)
 PROGRESS NOTE    Russell [name]  FMW:[id number] DOB: Oct 31, 1965 DOA: 59/09/2023 PCP: Eliverto [name], MD    Brief Narrative:   HPI: Patient with PMH of decubitus ulcer of sacrum and left foot ulcer, left hand osteomyelitis S/P transradial amputation in 2/25, right BKA, PAD, history of GI bleed, history of a flutter, HTN, morbid obesity, OSA not on CPAP, depression, uncontrolled type II DM with neuropathy,chronic HFpEF, chronic back pain, presented to the hospital with foul-smelling wound infection on his left foot. 5/25 was seen at [facility] ER for left foot ulcer.  Was prescribed pain medication.  Was referred to podiatry.  Also had difficulty with urination and was referred to urology. Since then the patient has been doing dressing changes.  Has not seen podiatry or urology.  At [facility] health. Presents with concern that the 1 ulcer that he had in his foot is now turning into multiple ulcers and they are getting worse.  Denies any fever or chills.  No nausea no vomiting.  No fall no trauma no injury reported recently. Had a fall 4 months ago since then he reports he has some chronic sacral pain and unable to sit up in the wheelchair and has been laying on his side. Has not seen PCP as well lately. Ran out of his insulin  and is only using [employer] brand short acting insulin . Reports dietary indiscretion with regards to his diabetes and therefore reports he has to occasionally use 80 units of insulin  a day. He reports that he has chronic neuropathy and does not feel any sensation in his legs below the knee.  If I break my leg, I will not feel it. He feels like he has some shortness of breath secondary to swelling. He does not use CPAP at nighttime.  Does not use oxygen at home. Denies any SI.     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 Concern for soft tissue gas. Discussed with podiatry Dr. Ashley.  Appreciate consultation. Recommend MRI foot. CK mildly elevated.  655 ESR 92.  CRP pending.  Meeting SIRS criteria with leukocytosis and tachypnea with evidence of infection in her foot. Most likely will require amputation.  Patient is aware. Plan: Status post AKA.  Postoperative day #4.  Pain control postoperatively challenging.  Patient remains to be a major fall risk.  Attempt to minimize narcotic regimen.  Bowel regimen.  Sacral decubitus ulcer. Wound care consult. Continue foam dressing.   Uncontrolled type 2 diabetes mellitus with hyperglycemia, with diabetic neuropathy as well as diabetic foot infection. On Lyrica  at home.  Will continue although low threshold to reduce the dose given his sleep apnea..  Hemoglobin A1c greater than 10.  Poor control. Glycemic control improved over interval Plan: Continue basal bolus regimen Adjust to achieve blood glucose 140-180 Continue carb modified diet Improve glycemic control over interval  Morbid obesity. Body mass index is  44.63 kg/m.  Obesity hypoventilation syndrome. Noncompliant with CPAP. Suspected chronic respiratory failure in the setting of OSA. Placing the patient at high risk for poor outcome. Patient reports that he is losing weight on weight but reports diet indiscretion. Recommend outpatient follow-up with PCP. Ordered CPAP however patient refused Will need outpatient sleep study   HLD. On statin. Can  resume statin   PAD. Continue aspirin    History of atrial flutter. History of GI bleed. No reoccurrence of atrial flutter per chart review. Had multiple admission with GI bleed therefore not on any anticoagulation. Continue to monitor   Depression. Patient is on Cymbalta , Wellbutrin . Continue Cymbalta    Chronic pain syndrome. Home regimen includes OxyContin  10 mg twice daily as well as oxycodone  as needed. Continue home regimen.  Augment as necessary to achieve pain control    DVT prophylaxis: SQ Lovenox  Code Status: Full Family Communication: None Disposition Plan: Status is: Inpatient Remains inpatient appropriate because: Gangrenous lower extremity.  Status post amputation.  Will need skilled nursing facility.   Level of care: Progressive  Consultants:  Vascular surgery  Procedures:  AKA 7/9  Antimicrobials: Zosyn  Zyvox    Subjective: Seen and examined.  Slightly more comfortable appearing this morning.  Objective: Vitals:   09/10/23 2343 09/11/23 0341 09/11/23 0838 09/11/23 0938  BP: 134/63 (!) 134/59 (!) 137/48 132/62  Pulse: 79 73 75 76  Resp: 18 16    Temp: 98.4 F (36.9 C) 98 F (36.7 C) 98.3 F (36.8 C) 97.7 F (36.5 C)  TempSrc:      SpO2: 97% 96% 97% 95%  Weight:      Height:        Intake/Output Summary (Last 24 hours) at 09/11/2023 1202 Last data filed at 09/11/2023 1000 Gross per 24 hour  Intake 695 ml  Output 2000 ml  Net -1305 ml   Filed Weights   09/06/23 0633 09/06/23 1940 09/07/23 1115  Weight: (!) 145.2 kg (!) 155.2 kg (!) 155.1 kg    Examination:  General exam: Mild distress.  Appears uncomfortable Respiratory system: Lungs clear.  Normal work of breathing.  Room air Cardiovascular system: S1 S2, RRR, no murmurs Gastrointestinal system: Obese, Soft, NT/ND, normal bowel sounds Central nervous system: Alert and oriented. No focal neurological deficits. Extremities: Right upper extremity amputation, right lower extremity  BKA, left lower extremity status post AKA Skin: No rashes, lesions or ulcers Psychiatry: Judgement and insight appear normal. Mood & affect appropriate.     Data Reviewed: I have personally reviewed following labs and imaging studies  CBC: Recent Labs  Lab 09/06/23 0648 09/07/23 0412 09/07/23 2047 09/08/23 0405  WBC 14.6* 11.2* 10.4 12.1*  NEUTROABS 13.0*  --   --   --   HGB 9.0* 8.0* 8.6* 7.6*  HCT 29.8* 26.5* 27.9* 24.2*  MCV 69.1* 69.0* 73.6* 72.9*  PLT 260 241 256 258   Basic Metabolic Panel: Recent Labs  Lab 09/06/23 0648 09/06/23 1018 09/06/23 2016 09/07/23 0412 09/08/23 0918  NA 129* 130* 129* 132* 130*  K 4.6 4.3 3.9 3.7 4.7  CL 96* 98 97* 98 99  CO2 25 23 23 23 24   GLUCOSE 540* 506* 415* 189* 362*  BUN 24* 24* 26* 26* 29*  CREATININE 1.12 1.10 1.00 1.08 1.21  CALCIUM  8.4* 8.0* 8.1* 8.1* 7.5*   GFR: Estimated Creatinine Clearance: 99.7 mL/min (by C-G formula based on SCr of 1.21 mg/dL). Liver Function Tests: Recent Labs  Lab 09/06/23 0648  AST 36  ALT 18  ALKPHOS 112  BILITOT 0.5  PROT 7.5  ALBUMIN  2.4*   No results for input(s): LIPASE, AMYLASE in the last 168 hours. No results for input(s): AMMONIA in the last 168 hours. Coagulation Profile: Recent Labs  Lab 09/06/23 0648 09/06/23 1020  INR 1.4* 1.4*   Cardiac Enzymes: Recent Labs  Lab 09/06/23 1020  CKTOTAL 655*   BNP (last 3 results) No results for input(s): PROBNP in the last 8760 hours. HbA1C: No results for input(s): HGBA1C in the last 72 hours.  CBG: Recent Labs  Lab 09/10/23 1611 09/10/23 2023 09/10/23 2347 09/11/23 0344 09/11/23 0843  GLUCAP 126* 94 100* 108* 115*   Lipid Profile: No results for input(s): CHOL, HDL, LDLCALC, TRIG, CHOLHDL, LDLDIRECT in the last 72 hours. Thyroid  Function Tests: No results for input(s): TSH, T4TOTAL, FREET4, T3FREE, THYROIDAB in the last 72 hours. Anemia Panel: No results for input(s): VITAMINB12,  FOLATE, FERRITIN, TIBC, IRON , RETICCTPCT in the last 72 hours. Sepsis Labs: Recent Labs  Lab 09/06/23 0648  PROCALCITON 3.40  LATICACIDVEN 1.8    Recent Results (from the past 240 hours)  Culture, blood (Routine x 2)     Status: None   Collection Time: 09/06/23  6:48 AM   Specimen: BLOOD RIGHT ARM  Result Value Ref Range Status   Specimen Description BLOOD RIGHT ARM  Final   Special Requests   Final    BOTTLES DRAWN AEROBIC AND ANAEROBIC Blood Culture adequate volume   Culture   Final    NO GROWTH 5 DAYS Performed at [facility], [place], [address], [place], [place] [address]    Report Status 09/11/2023 FINAL  Final  Culture, blood (Routine x 2)     Status: None   Collection Time: 09/06/23  9:16 AM   Specimen: BLOOD  Result Value Ref Range Status   Specimen Description BLOOD BLOOD RIGHT WRIST  Final   Special Requests   Final    BOTTLES DRAWN AEROBIC AND ANAEROBIC Blood Culture adequate volume   Culture   Final    NO GROWTH 5 DAYS Performed at [facility], [address], [place], [place] [address]    Report Status 09/11/2023 FINAL  Final  Aerobic Culture w Gram Stain (superficial specimen)     Status: None   Collection Time: 09/06/23 10:18 AM   Specimen: Foot; Wound  Result Value Ref Range Status   Specimen Description   Final    FOOT Performed at [facility], [address], [place], [place] [address]    Special Requests   Final    LT FOOT Performed at [facility] [address] [place], [place] [address]    Gram Stain   Final    NO WBC SEEN RARE GRAM NEGATIVE RODS FEW GRAM POSITIVE COCCI IN PAIRS Performed at [facility], [address] [place], [place] [address]    Culture   Final    ABUNDANT PROTEUS MIRABILIS ABUNDANT METHICILLIN RESISTANT STAPHYLOCOCCUS AUREUS ABUNDANT ENTEROCOCCUS FAECALIS ESCHERICHIA COLI Confirmed Extended Spectrum Beta-Lactamase Producer (ESBL).  In bloodstream infections  from ESBL organisms, carbapenems are preferred over piperacillin /tazobactam. They are shown to have a lower risk of mortality.    Report Status 09/10/2023 FINAL  Final   Organism ID, Bacteria PROTEUS MIRABILIS  Final   Organism ID, Bacteria METHICILLIN RESISTANT STAPHYLOCOCCUS AUREUS  Final   Organism ID, Bacteria ENTEROCOCCUS FAECALIS  Final   Organism ID, Bacteria ESCHERICHIA COLI  Final      Susceptibility  Escherichia coli - MIC*    AMPICILLIN  >=32 RESISTANT Resistant     CEFEPIME  8 INTERMEDIATE Intermediate     CEFTAZIDIME RESISTANT Resistant     CEFTRIAXONE  >=64 RESISTANT Resistant     CIPROFLOXACIN  >=4 RESISTANT Resistant     GENTAMICIN <=1 SENSITIVE Sensitive     IMIPENEM <=0.25 SENSITIVE Sensitive     TRIMETH/SULFA <=20 SENSITIVE Sensitive     AMPICILLIN /SULBACTAM >=32 RESISTANT Resistant     PIP/TAZO 8 SENSITIVE Sensitive ug/mL    * ESCHERICHIA COLI   Enterococcus faecalis - MIC*    AMPICILLIN  <=2 SENSITIVE Sensitive     VANCOMYCIN  2 SENSITIVE Sensitive     GENTAMICIN SYNERGY SENSITIVE Sensitive     * ABUNDANT ENTEROCOCCUS FAECALIS   Methicillin resistant staphylococcus aureus - MIC*    CIPROFLOXACIN  >=8 RESISTANT Resistant     ERYTHROMYCIN >=8 RESISTANT Resistant     GENTAMICIN <=0.5 SENSITIVE Sensitive     OXACILLIN >=4 RESISTANT Resistant     TETRACYCLINE <=1 SENSITIVE Sensitive     VANCOMYCIN  <=0.5 SENSITIVE Sensitive     TRIMETH/SULFA >=320 RESISTANT Resistant     CLINDAMYCIN  <=0.25 SENSITIVE Sensitive     RIFAMPIN <=0.5 SENSITIVE Sensitive     Inducible Clindamycin  NEGATIVE Sensitive     LINEZOLID  2 SENSITIVE Sensitive     * ABUNDANT METHICILLIN RESISTANT STAPHYLOCOCCUS AUREUS   Proteus mirabilis - MIC*    AMPICILLIN  <=2 SENSITIVE Sensitive     CEFEPIME  <=0.12 SENSITIVE Sensitive     CEFTAZIDIME <=1 SENSITIVE Sensitive     CEFTRIAXONE  <=0.25 SENSITIVE Sensitive     CIPROFLOXACIN  <=0.25 SENSITIVE Sensitive     GENTAMICIN <=1 SENSITIVE Sensitive      IMIPENEM 2 SENSITIVE Sensitive     TRIMETH/SULFA <=20 SENSITIVE Sensitive     AMPICILLIN /SULBACTAM <=2 SENSITIVE Sensitive     PIP/TAZO <=4 SENSITIVE Sensitive ug/mL    * ABUNDANT PROTEUS MIRABILIS  MRSA Next Gen by PCR, Nasal     Status: Abnormal   Collection Time: 09/07/23  4:41 AM   Specimen: Nasal Mucosa; Nasal Swab  Result Value Ref Range Status   MRSA by PCR Next Gen DETECTED (A) NOT DETECTED Final    Comment: RESULT CALLED TO, READ BACK BY AND VERIFIED WITH: ALLEAN BROCKS RN [phone] 09/07/23 HNM (NOTE) The GeneXpert MRSA Assay (FDA approved for NASAL specimens only), is one component of a comprehensive MRSA colonization surveillance program. It is not intended to diagnose MRSA infection nor to guide or monitor treatment for MRSA infections. Test performance is not FDA approved in patients less than 37 years old. Performed at [facility], [address] [place], [place] [address]          Radiology Studies: DG Abd 1 View Result Date: 09/10/2023 CLINICAL DATA:  Constipation. EXAM: ABDOMEN - 1 VIEW COMPARISON:  11/21/2020 FINDINGS: No evidence of dilated bowel loops. Solid food stuff noted in stomach. No significant colonic stool burden seen. IMPRESSION: No acute findings.  No significant colonic stool burden. Electronically Signed   By: Norleen DELENA Kil M.D.   On: 09/10/2023 11:40         Scheduled Meds:  vitamin C   500 mg Oral BID   aspirin  EC  81 mg Oral Daily   atorvastatin   40 mg Oral Daily   Chlorhexidine  Gluconate Cloth  6 each Topical Daily   DULoxetine   90 mg Oral Daily   enoxaparin  (LOVENOX ) injection  70 mg Subcutaneous Q24H   finasteride   5 mg Oral Daily   insulin   aspart  0-20 Units Subcutaneous Q4H   insulin  aspart  5 Units Subcutaneous TID WC   insulin  glargine-yfgn  20 Units Subcutaneous QHS   methocarbamol   750 mg Oral QID   multivitamin with minerals  1 tablet Oral Daily   mupirocin  ointment  1 Application Nasal BID   nutrition supplement  (JUVEN)  1 packet Oral BID BM   oxyCODONE   15 mg Oral Q12H   pantoprazole   40 mg Oral BID   pregabalin   150 mg Oral TID   Ensure Max Protein  11 oz Oral QHS   tamsulosin   0.4 mg Oral QPC supper   zinc  sulfate (50mg  elemental zinc )  220 mg Oral Daily   Continuous Infusions:  ampicillin -sulbactam (UNASYN ) IV 3 g (09/11/23 0944)     LOS: 5 days     Calvin KATHEE Robson, MD [facility]   If 7PM-7AM, please contact night-coverage  09/11/2023, 12:02 PM

## 2023-09-12 DIAGNOSIS — M86172 Other acute osteomyelitis, left ankle and foot: Secondary | ICD-10-CM | POA: Diagnosis not present

## 2023-09-12 LAB — GLUCOSE, CAPILLARY
Glucose-Capillary: 148 mg/dL — ABNORMAL HIGH (ref 70–99)
Glucose-Capillary: 124 mg/dL — ABNORMAL HIGH (ref 70–99)
Glucose-Capillary: 134 mg/dL — ABNORMAL HIGH (ref 70–99)
Glucose-Capillary: 133 mg/dL — ABNORMAL HIGH (ref 70–99)
Glucose-Capillary: 83 mg/dL (ref 70–99)

## 2023-09-12 MED ORDER — OXYCODONE HCL ER 10 MG PO T12A
10.0000 mg | EXTENDED_RELEASE_TABLET | Freq: Two times a day (BID) | ORAL | Status: DC
  Administered 2023-09-12: 10 mg via ORAL
  Filled 2023-09-12 (×2): qty 1

## 2023-09-12 MED ORDER — ORAL CARE MOUTH RINSE: 15.0000 mL | OROMUCOSAL | Status: DC | PRN

## 2023-09-12 MED ORDER — METHOCARBAMOL 500 MG PO TABS
500.0000 mg | ORAL_TABLET | Freq: Three times a day (TID) | ORAL | Status: DC
  Administered 2023-09-12 (×2): 500 mg via ORAL
  Filled 2023-09-12 (×3): qty 1

## 2023-09-12 MED ORDER — SODIUM CHLORIDE 0.9% FLUSH: 10.0000 mL | INTRAVENOUS | Status: DC | PRN

## 2023-09-12 NOTE — Plan of Care (Signed)

## 2023-09-12 NOTE — Progress Notes (Signed)
 Occupational Therapy Treatment Patient Details Name: Russell [name] MRN: [id number] DOB: 11/29/1965 Today's Date: 09/12/2023   History of present illness Pt is a 58 y.o. male s/p L AKA on 09/07/23. PMH of left hand osteomyelitis S/P transradial amputation in 2/25, right BKA, decubitus ulcer of sacrum and left foot ulcer, PAD, history of GI bleed, history of A -flutter, HTN, morbid obesity, OSA not on CPAP, depression, uncontrolled type II DM with neuropathy, chronic HFpEF, chronic back pain   OT comments  Russell [name] seen for OT treatment on this date. Upon arrival to room pt in bed, agreeable to tx. Pt requires CGA to scoot bilaterally along bed; completed grooming task at EOB w/ SETUP and SUPERVISION. Attempted lateral scoot t/f to [facility], however, pt quickly fatigued after 1 onto BSC, requiring MAX A +2 physical assist to scoot back toward HOB. Extensive education on d/c recs provided. Pt making progress toward goals, will continue to follow POC. Discharge recommendation remains appropriate.        If plan is discharge home, recommend the following:  Two people to help with walking and/or transfers;A lot of help with bathing/dressing/bathroom;Assistance with cooking/housework;Direct supervision/assist for financial management;Supervision due to cognitive status;Direct supervision/assist for medications management;Help with stairs or ramp for entrance;Assist for transportation   Equipment Recommendations  [place] lift;Hospital bed;BSC/3in1    Recommendations for Other Services      Precautions / Restrictions Precautions Precautions: Fall Recall of Precautions/Restrictions: Impaired Precaution/Restrictions Comments: chronic R BKA, acute L AKA, L transradial amputation, foley Restrictions Weight Bearing Restrictions Per Provider Order: No       Mobility Bed Mobility Overal bed mobility: Needs Assistance Bed Mobility: Supine to Sit, Sit to Supine, Rolling Rolling: Min assist, +2 for  physical assistance, Used rails   Supine to sit: Mod assist, Used rails Sit to supine: Min assist, +2 for physical assistance        Transfers Overall transfer level: Needs assistance   Transfers: Bed to chair/wheelchair/BSC            Lateral/Scoot Transfers: Contact guard assist, Max assist, +2 physical assistance General transfer comment: CGA to scoot L and R at EOB, with fatigue increased to maxAx2 to safely scoot back onto EOB and towards HOB     Balance Overall balance assessment: Needs assistance Sitting-balance support: No upper extremity supported Sitting balance-Leahy Scale: Good                                     ADL either performed or assessed with clinical judgement   ADL Overall ADL's : Needs assistance/impaired                                       General ADL Comments: SETUP/SUPERVISION for face washing seated EOB; MAX A +2 physical for attempted BSC t/f, MAX A for pericare bed level    Extremity/Trunk Assessment Upper Extremity Assessment Upper Extremity Assessment: Right hand dominant;LUE deficits/detail RUE Sensation: decreased light touch;history of peripheral neuropathy LUE Deficits / Details: L transradial amputation 04/2023   Lower Extremity Assessment Lower Extremity Assessment: RLE deficits/detail;LLE deficits/detail RLE Deficits / Details: chronic R BKA LLE Deficits / Details: acute L AKA, bandage intact start and end of session        Vision       Perception     Praxis  Communication Communication Communication: No apparent difficulties   Cognition Arousal: Alert Behavior During Therapy: WFL for tasks assessed/performed Cognition: No family/caregiver present to determine baseline                               Following commands: Intact        Cueing   Cueing Techniques: Verbal cues, Gestural cues  Exercises      Shoulder Instructions       General Comments       Pertinent Vitals/ Pain       Pain Assessment Pain Assessment: Faces Faces Pain Scale: Hurts little more Pain Location: LLE Pain Descriptors / Indicators: Aching, Discomfort, Grimacing Pain Intervention(s): Limited activity within patient's tolerance, Monitored during session, Repositioned  Home Living                                          Prior Functioning/Environment              Frequency  Min 2X/week        Progress Toward Goals  OT Goals(current goals can now be found in the care plan section)  Progress towards OT goals: Progressing toward goals  Acute Rehab OT Goals OT Goal Formulation: With patient Time For Goal Achievement: 09/26/23 Potential to Achieve Goals: Good ADL Goals Pt Will Perform Grooming: sitting;with modified independence Pt Will Perform Upper Body Dressing: with modified independence;sitting Pt Will Perform Lower Body Dressing: sitting/lateral leans;with mod assist Pt Will Transfer to Toilet: anterior/posterior transfer;bedside commode;with min assist;with transfer board Pt Will Perform Toileting - Clothing Manipulation and hygiene: with mod assist;with adaptive equipment;sitting/lateral leans  Plan      Co-evaluation    PT/OT/SLP Co-Evaluation/Treatment: Yes Reason for Co-Treatment: For patient/therapist safety;To address functional/ADL transfers PT goals addressed during session: Mobility/safety with mobility;Balance OT goals addressed during session: ADL's and self-care;Proper use of Adaptive equipment and DME      AM-PAC OT 6 Clicks Daily Activity     Outcome Measure   Help from another person eating meals?: A Little Help from another person taking care of personal grooming?: A Little Help from another person toileting, which includes using toliet, bedpan, or urinal?: A Lot Help from another person bathing (including washing, rinsing, drying)?: A Lot Help from another person to put on and taking off regular  upper body clothing?: A Little Help from another person to put on and taking off regular lower body clothing?: A Lot 6 Click Score: 15    End of Session Equipment Utilized During Treatment: Gait belt  OT Visit Diagnosis: Other abnormalities of gait and mobility (R26.89);Muscle weakness (generalized) (M62.81);Pain Pain - Right/Left: Left Pain - part of body: Leg;Hip   Activity Tolerance Patient tolerated treatment well;Patient limited by fatigue   Patient Left in bed;with call bell/phone within reach;with bed alarm set   Nurse Communication          Time: 8673-8596 OT Time Calculation (min): 37 min  Charges: OT General Charges $OT Visit: 1 Visit OT Treatments $Self Care/Home Management : 8-22 mins  [place], Student OT   [employer] 09/12/2023, 4:03 PM

## 2023-09-12 NOTE — Progress Notes (Signed)
 PROGRESS NOTE    Russell [name]  FMW:[id number] DOB: 1965-12-13 DOA: 09/06/2023 PCP: Eliverto [name], MD    Brief Narrative:   HPI: Patient with PMH of decubitus ulcer of sacrum and left foot ulcer, left hand osteomyelitis S/P transradial amputation in 2/25, right BKA, PAD, history of GI bleed, history of a flutter, HTN, morbid obesity, OSA not on CPAP, depression, uncontrolled type II DM with neuropathy,chronic HFpEF, chronic back pain, presented to the hospital with foul-smelling wound infection on his left foot. 5/25 was seen at [facility] ER for left foot ulcer.  Was prescribed pain medication.  Was referred to podiatry.  Also had difficulty with urination and was referred to urology. Since then the patient has been doing dressing changes.  Has not seen podiatry or urology.  At [facility] health. Presents with concern that the 1 ulcer that he had in his foot is now turning into multiple ulcers and they are getting worse.  Denies any fever or chills.  No nausea no vomiting.  No fall no trauma no injury reported recently. Had a fall 4 months ago since then he reports he has some chronic sacral pain and unable to sit up in the wheelchair and has been laying on his side. Has not seen PCP as well lately. Ran out of his insulin  and is only using [employer] brand short acting insulin . Reports dietary indiscretion with regards to his diabetes and therefore reports he has to occasionally use 80 units of insulin  a day. He reports that he has chronic neuropathy and does not feel any sensation in his legs below the knee.  If I break my leg, I will not feel it. He feels like he has some shortness of breath secondary to swelling. He does not use CPAP at nighttime.  Does not use oxygen at home. Denies any SI.     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 Concern for soft tissue gas. Discussed with podiatry Dr. Ashley.  Appreciate consultation. Recommend MRI foot. CK mildly elevated.  655 ESR 92.  CRP pending.  Meeting SIRS criteria with leukocytosis and tachypnea with evidence of infection in her foot. Most likely will require amputation.  Patient is aware. Plan: Status post AKA.  Postoperative day #5.  Pain control postoperatively challenging.  Patient remains to be a major fall risk.  Attempt to minimize narcotic regimen.  Bowel regimen.  Having Bms.  Will need SNF  Sacral decubitus ulcer. Wound care consult. Continue foam dressing.   Uncontrolled type 2 diabetes mellitus with hyperglycemia, with diabetic neuropathy as well as diabetic foot infection. On Lyrica  at home.  Will continue although low threshold to reduce the dose given his sleep apnea..  Hemoglobin A1c greater than 10.  Poor control. Glycemic control improved over interval Plan: Continue basal bolus regimen Adjust to achieve blood glucose 140-180 Continue carb modified diet Adequate glycemic control over interval  Morbid obesity. Body mass index is 44.63 kg/m.  Obesity hypoventilation syndrome. Noncompliant with CPAP. Suspected chronic respiratory failure in the setting of OSA. Placing the patient at high risk for poor outcome. Patient reports that he is losing weight on weight but reports diet indiscretion. Recommend outpatient follow-up with PCP. Ordered CPAP however patient refused Will need outpatient sleep  study   HLD. On statin. Can resume statin   PAD. Continue aspirin    History of atrial flutter. History of GI bleed. No reoccurrence of atrial flutter per chart review. Had multiple admission with GI bleed therefore not on any anticoagulation. Continue to monitor   Depression. Patient is on Cymbalta , Wellbutrin . Continue Cymbalta    Chronic pain syndrome. Home regimen includes OxyContin  10 mg twice daily as well as oxycodone  as needed. Continue home regimen.  Augment as necessary to achieve pain control    DVT prophylaxis: SQ Lovenox  Code Status: Full Family Communication: None Disposition Plan: Status is: Inpatient Remains inpatient appropriate because: Gangrenous lower extremity.  Status post amputation.  Will need skilled nursing facility.   Level of care: Progressive  Consultants:  Vascular surgery  Procedures:  AKA 7/9  Antimicrobials: Zosyn  Zyvox    Subjective: Seen and examined. Appears more comfortable this AM.  Objective: Vitals:   09/12/23 0354 09/12/23 0751 09/12/23 1142 09/12/23 1612  BP: (!) 164/67 (!) 155/69 (!) 156/70 (!) 154/73  Pulse: 71 73 71 72  Resp: 18 16 16 18   Temp: 98 F (36.7 C) 97.9 F (36.6 C) 97.9 F (36.6 C) 98.5 F (36.9 C)  TempSrc:    Oral  SpO2: 97% 95% 99% 91%  Weight:      Height:        Intake/Output Summary (Last 24 hours) at 09/12/2023 1738 Last data filed at 09/12/2023 1640 Gross per 24 hour  Intake 1280 ml  Output 2100 ml  Net -820 ml   Filed Weights   09/06/23 9366 09/06/23 1940 09/07/23 1115  Weight: (!) 145.2 kg (!) 155.2 kg (!) 155.1 kg    Examination:  General exam: Appears fatigued. Respiratory system: Lungs clear.  Normal work of breathing.  Room air Cardiovascular system: S1 S2, RRR, no murmurs Gastrointestinal system: Obese, Soft, NT/ND, normal bowel sounds Central nervous system: Alert and oriented. No focal neurological deficits. Extremities: Right upper extremity amputation, right lower  extremity BKA, left lower extremity status post AKA Skin: No rashes, lesions or ulcers Psychiatry: Judgement and insight appear normal. Mood & affect appropriate.     Data Reviewed: I have personally reviewed following labs and imaging studies  CBC: Recent Labs  Lab 09/06/23 0648 09/07/23 0412 09/07/23 2047 09/08/23 0405  WBC 14.6* 11.2* 10.4 12.1*  NEUTROABS 13.0*  --   --   --   HGB 9.0* 8.0* 8.6* 7.6*  HCT 29.8* 26.5* 27.9* 24.2*  MCV 69.1* 69.0* 73.6* 72.9*  PLT 260 241 256 258   Basic Metabolic Panel: Recent Labs  Lab 09/06/23 0648 09/06/23 1018 09/06/23 2016 09/07/23 0412 09/08/23 0918  NA 129* 130* 129* 132* 130*  K 4.6 4.3 3.9 3.7 4.7  CL 96* 98 97* 98 99  CO2 25 23 23 23 24   GLUCOSE 540* 506* 415* 189* 362*  BUN 24* 24* 26* 26* 29*  CREATININE 1.12 1.10 1.00 1.08 1.21  CALCIUM  8.4* 8.0* 8.1* 8.1* 7.5*   GFR: Estimated Creatinine Clearance: 99.7 mL/min (by C-G formula based on SCr of 1.21 mg/dL). Liver Function Tests: Recent Labs  Lab 09/06/23 0648  AST 36  ALT 18  ALKPHOS 112  BILITOT 0.5  PROT 7.5  ALBUMIN  2.4*   No results for input(s): LIPASE, AMYLASE in the last 168 hours. No results for input(s): AMMONIA in the last 168 hours. Coagulation Profile: Recent Labs  Lab 09/06/23 0648 09/06/23 1020  INR 1.4* 1.4*   Cardiac Enzymes: Recent Labs  Lab 09/06/23 1020  CKTOTAL 655*   BNP (last 3 results) No results for input(s): PROBNP in the last 8760 hours. HbA1C: No results for input(s): HGBA1C in the last 72 hours.  CBG: Recent Labs  Lab 09/11/23 2127 09/12/23 0400 09/12/23 0752 09/12/23 1144 09/12/23 1613  GLUCAP 150* 124* 134* 133* 83   Lipid Profile: No results for input(s): CHOL, HDL, LDLCALC, TRIG, CHOLHDL, LDLDIRECT in the last 72 hours. Thyroid  Function Tests: No results for input(s): TSH, T4TOTAL, FREET4, T3FREE, THYROIDAB in the last 72 hours. Anemia Panel: No results for input(s):  VITAMINB12, FOLATE, FERRITIN, TIBC, IRON , RETICCTPCT in the last 72 hours. Sepsis Labs: Recent Labs  Lab 09/06/23 0648  PROCALCITON 3.40  LATICACIDVEN 1.8    Recent Results (from the past 240 hours)  Culture, blood (Routine x 2)     Status: None   Collection Time: 09/06/23  6:48 AM   Specimen: BLOOD RIGHT ARM  Result Value Ref Range Status   Specimen Description BLOOD RIGHT ARM  Final   Special Requests   Final    BOTTLES DRAWN AEROBIC AND ANAEROBIC Blood Culture adequate volume   Culture   Final    NO GROWTH 5 DAYS Performed at [facility], [address], [place], [place] [address]    Report Status 09/11/2023 FINAL  Final  Culture, blood (Routine x 2)     Status: None   Collection Time: 09/06/23  9:16 AM   Specimen: BLOOD  Result Value Ref Range Status   Specimen Description BLOOD BLOOD RIGHT WRIST  Final   Special Requests   Final    BOTTLES DRAWN AEROBIC AND ANAEROBIC Blood Culture adequate volume   Culture   Final    NO GROWTH 5 DAYS Performed at [facility], [address] [place], [place] [address]    Report Status 09/11/2023 FINAL  Final  Aerobic Culture w Gram Stain (superficial specimen)     Status: None   Collection Time: 09/06/23 10:18 AM   Specimen: Foot; Wound  Result Value Ref Range Status   Specimen Description   Final    FOOT Performed at [facility], [address] [place], [place] [address]    Special Requests   Final    LT FOOT Performed at [facility], [address] [place], [place] [address]    Gram Stain   Final    NO WBC SEEN RARE GRAM NEGATIVE RODS FEW GRAM POSITIVE COCCI IN PAIRS Performed at [facility], [address], [place], [place] [address]    Culture   Final    ABUNDANT PROTEUS MIRABILIS ABUNDANT METHICILLIN RESISTANT STAPHYLOCOCCUS AUREUS ABUNDANT ENTEROCOCCUS FAECALIS ESCHERICHIA COLI Confirmed Extended Spectrum Beta-Lactamase Producer (ESBL).  In bloodstream  infections from ESBL organisms, carbapenems are preferred over piperacillin /tazobactam. They are shown to have a lower risk of mortality.    Report Status 09/10/2023 FINAL  Final   Organism ID, Bacteria PROTEUS MIRABILIS  Final   Organism ID, Bacteria METHICILLIN RESISTANT STAPHYLOCOCCUS AUREUS  Final   Organism ID, Bacteria ENTEROCOCCUS FAECALIS  Final   Organism ID, Bacteria ESCHERICHIA COLI  Final  Susceptibility   Escherichia coli - MIC*    AMPICILLIN  >=32 RESISTANT Resistant     CEFEPIME  8 INTERMEDIATE Intermediate     CEFTAZIDIME RESISTANT Resistant     CEFTRIAXONE  >=64 RESISTANT Resistant     CIPROFLOXACIN  >=4 RESISTANT Resistant     GENTAMICIN <=1 SENSITIVE Sensitive     IMIPENEM <=0.25 SENSITIVE Sensitive     TRIMETH/SULFA <=20 SENSITIVE Sensitive     AMPICILLIN /SULBACTAM >=32 RESISTANT Resistant     PIP/TAZO 8 SENSITIVE Sensitive ug/mL    * ESCHERICHIA COLI   Enterococcus faecalis - MIC*    AMPICILLIN  <=2 SENSITIVE Sensitive     VANCOMYCIN  2 SENSITIVE Sensitive     GENTAMICIN SYNERGY SENSITIVE Sensitive     * ABUNDANT ENTEROCOCCUS FAECALIS   Methicillin resistant staphylococcus aureus - MIC*    CIPROFLOXACIN  >=8 RESISTANT Resistant     ERYTHROMYCIN >=8 RESISTANT Resistant     GENTAMICIN <=0.5 SENSITIVE Sensitive     OXACILLIN >=4 RESISTANT Resistant     TETRACYCLINE <=1 SENSITIVE Sensitive     VANCOMYCIN  <=0.5 SENSITIVE Sensitive     TRIMETH/SULFA >=320 RESISTANT Resistant     CLINDAMYCIN  <=0.25 SENSITIVE Sensitive     RIFAMPIN <=0.5 SENSITIVE Sensitive     Inducible Clindamycin  NEGATIVE Sensitive     LINEZOLID  2 SENSITIVE Sensitive     * ABUNDANT METHICILLIN RESISTANT STAPHYLOCOCCUS AUREUS   Proteus mirabilis - MIC*    AMPICILLIN  <=2 SENSITIVE Sensitive     CEFEPIME  <=0.12 SENSITIVE Sensitive     CEFTAZIDIME <=1 SENSITIVE Sensitive     CEFTRIAXONE  <=0.25 SENSITIVE Sensitive     CIPROFLOXACIN  <=0.25 SENSITIVE Sensitive     GENTAMICIN <=1 SENSITIVE Sensitive      IMIPENEM 2 SENSITIVE Sensitive     TRIMETH/SULFA <=20 SENSITIVE Sensitive     AMPICILLIN /SULBACTAM <=2 SENSITIVE Sensitive     PIP/TAZO <=4 SENSITIVE Sensitive ug/mL    * ABUNDANT PROTEUS MIRABILIS  MRSA Next Gen by PCR, Nasal     Status: Abnormal   Collection Time: 09/07/23  4:41 AM   Specimen: Nasal Mucosa; Nasal Swab  Result Value Ref Range Status   MRSA by PCR Next Gen DETECTED (A) NOT DETECTED Final    Comment: RESULT CALLED TO, READ BACK BY AND VERIFIED WITH: ALLEAN BROCKS RN [phone] 09/07/23 HNM (NOTE) The GeneXpert MRSA Assay (FDA approved for NASAL specimens only), is one component of a comprehensive MRSA colonization surveillance program. It is not intended to diagnose MRSA infection nor to guide or monitor treatment for MRSA infections. Test performance is not FDA approved in patients less than 71 years old. Performed at [facility], [address] [place], [place] [address]          Radiology Studies: No results found.        Scheduled Meds:  vitamin C   500 mg Oral BID   aspirin  EC  81 mg Oral Daily   atorvastatin   40 mg Oral Daily   Chlorhexidine  Gluconate Cloth  6 each Topical Daily   DULoxetine   90 mg Oral Daily   enoxaparin  (LOVENOX ) injection  70 mg Subcutaneous Q24H   finasteride   5 mg Oral Daily   insulin  aspart  0-20 Units Subcutaneous Q4H   insulin  aspart  5 Units Subcutaneous TID WC   insulin  glargine-yfgn  20 Units Subcutaneous QHS   methocarbamol   500 mg Oral TID   multivitamin with minerals  1 tablet Oral Daily   nutrition supplement (JUVEN)  1 packet Oral BID BM   oxyCODONE   10 mg Oral Q12H   pantoprazole   40 mg Oral BID   polyethylene glycol  17 g Oral Daily   pregabalin   150 mg Oral TID   Ensure Max Protein  11 oz Oral QHS   senna-docusate  1 tablet Oral BID   tamsulosin   0.4 mg Oral QPC supper   zinc  sulfate (50mg  elemental zinc )  220 mg Oral Daily   Continuous Infusions:  ampicillin -sulbactam (UNASYN ) IV 3 g (09/12/23  1647)     LOS: 6 days     Calvin KATHEE Robson, MD [facility]   If 7PM-7AM, please contact night-coverage  09/12/2023, 5:38 PM

## 2023-09-12 NOTE — Progress Notes (Signed)

## 2023-09-12 NOTE — TOC Progression Note (Signed)
 Transition of Care [facility]) - Progression Note    Patient Details  Name: Russell [name] MRN: [id number] Date of Birth: 01-17-1966  Transition of Care [facility]) CM/SW Contact  Tomasa JAYSON Childes, RN Phone Number: 09/12/2023, 3:46 PM  Clinical Narrative:    Attempt to reach patient to discuss SNF. Unable to speak with patient.       Barriers to Discharge: Continued Medical Work up  Expected Discharge Plan and Services In-house Referral: Clinical Social Work     Living arrangements for the past 2 months: Single Family Home                                       Social Determinants of Health (SDOH) Interventions SDOH Screenings   Food Insecurity: Unknown (09/07/2023)  Housing: Low Risk  (09/07/2023)  Transportation Needs: No Transportation Needs (09/07/2023)  Utilities: Not At Risk (09/07/2023)  Financial Resource Strain: Low Risk  (04/04/2023)   Received from [facility]  Tobacco Use: Low Risk  (09/07/2023)    Readmission Risk Interventions    03/03/2023   12:27 PM 10/25/2022    3:55 PM  Readmission Risk Prevention Plan  Transportation Screening Complete Complete  PCP or Specialist Appt within 3-5 Days  Complete  HRI or Home Care Consult  Complete  Social Work Consult for Recovery Care Planning/Counseling  Complete  Palliative Care Screening  Not Applicable  Medication Review Ocean[facility]) Complete Complete  PCP or Specialist appointment within 3-5 days of discharge Complete   HRI or Home Care Consult Complete   Palliative Care Screening Not Applicable   Skilled Nursing Facility Not Applicable

## 2023-09-12 NOTE — Progress Notes (Signed)
 Progress Note    09/12/2023 10:04 AM 5 Days Post-Op  Subjective:  Russell [name] is a 58 year old male who is now postop day #5 left above-the-knee amputation. Patient was resting when I arrived to examine him this morning. He endorses pain to the incision site but is being managed well with pain medication. No other complaints overnight. Vitals all remained stable.    Vitals:   09/12/23 0354 09/12/23 0751  BP: (!) 164/67 (!) 155/69  Pulse: 71 73  Resp: 18 16  Temp: 98 F (36.7 C) 97.9 F (36.6 C)  SpO2: 97% 95%   Physical Exam: Cardiac:  RRR, normal S1 and S2.  No rubs clicks gallops or murmurs noted. Lungs: Nonlabored breathing, clear throughout auscultation but diminished bases.  No rales rhonchi or wheezing noted Incisions: Left above-the-knee with dressing clean dry and intact Extremities: Patient has a history of right BKA left upper arm below the elbow amputation wound left above-the-knee amputation.  Patient's right upper extremity with palpable pulses. Abdomen: Positive bowel sounds throughout, morbid obesity with soft abdomen patient. Neurologic: Alert and oriented x 3, questions follows commands appropriately.  CBC    Component Value Date/Time   WBC 12.1 (H) 09/08/2023 0405   RBC 3.32 (L) 09/08/2023 0405   HGB 7.6 (L) 09/08/2023 0405   HGB 12.6 (L) 06/13/2014 0421   HCT 24.2 (L) 09/08/2023 0405   HCT 39.0 (L) 06/13/2014 0421   PLT 258 09/08/2023 0405   PLT 190 06/13/2014 0421   MCV 72.9 (L) 09/08/2023 0405   MCV 80 06/13/2014 0421   MCH 22.9 (L) 09/08/2023 0405   MCHC 31.4 09/08/2023 0405   RDW 18.6 (H) 09/08/2023 0405   RDW 15.1 (H) 06/13/2014 0421   LYMPHSABS 0.7 09/06/2023 0648   LYMPHSABS 2.1 06/13/2014 0421   MONOABS 0.7 09/06/2023 0648   MONOABS 0.8 06/13/2014 0421   EOSABS 0.0 09/06/2023 0648   EOSABS 0.4 06/13/2014 0421   BASOSABS 0.0 09/06/2023 0648   BASOSABS 0.1 06/13/2014 0421    BMET    Component Value Date/Time   NA 130 (L)  09/08/2023 0918   NA 136 06/13/2014 0421   K 4.7 09/08/2023 0918   K 4.0 06/13/2014 0421   CL 99 09/08/2023 0918   CL 100 (L) 06/13/2014 0421   CO2 24 09/08/2023 0918   CO2 29 06/13/2014 0421   GLUCOSE 362 (H) 09/08/2023 0918   GLUCOSE 143 (H) 06/13/2014 0421   BUN 29 (H) 09/08/2023 0918   BUN 22 (H) 06/13/2014 0421   CREATININE 1.21 09/08/2023 0918   CREATININE 0.87 06/13/2014 0421   CALCIUM  7.5 (L) 09/08/2023 0918   CALCIUM  8.6 (L) 06/13/2014 0421   GFRNONAA >60 09/08/2023 0918   GFRNONAA >60 06/13/2014 0421   GFRAA >60 06/03/2018 0014   GFRAA >60 06/13/2014 0421    INR    Component Value Date/Time   INR 1.4 (H) 09/06/2023 1020   INR 0.9 06/05/2014 2241     Intake/Output Summary (Last 24 hours) at 09/12/2023 1004 Last data filed at 09/12/2023 0900 Gross per 24 hour  Intake 2100 ml  Output 2500 ml  Net -400 ml     Assessment/Plan:  58 y.o. male is s/p left above-the-knee amputatio  5 Days Post-Op   PLAN Okay per vascular surgery for patient to be discharged home when medically stable.  Patient states he lives with his wife at home at this time.  Dressing change has been completed over the weekend by Dr. Norleen Pereyra vascular  surgery.  No complications to note.  No drainage or infection noted.  Continue daily dressing changes.  DVT prophylaxis: Lovenox  70 mg subcutaneous every 24 hours.   [name] [facility] 09/12/2023 10:04 AM

## 2023-09-12 NOTE — Progress Notes (Signed)
 Physical Therapy Treatment Patient Details Name: Russell [name] MRN: [id number] DOB: Jul 23, 1965 Today's Date: 09/12/2023   History of Present Illness Pt is a 58 y.o. male s/p L AKA on 09/07/23. PMH of left hand osteomyelitis S/P transradial amputation in 2/25, right BKA, decubitus ulcer of sacrum and left foot ulcer, PAD, history of GI bleed, history of A -flutter, HTN, morbid obesity, OSA not on CPAP, depression, uncontrolled type II DM with neuropathy, chronic HFpEF, chronic back pain    PT Comments  Patient alert, though appeared fatigued, intermittently unaware of situation or does not answer questions appropriately. Self directive of session with limited ability to re-direct or educate. Co-treat with OT to attempt OOB transfer to [facility]. With encouragement pt comes up into sitting, [name] for weight shift. He was able to scoot R and L at EOB with CGA-supervision, more effortful and difficulty noted to the L. R lateral scoot to [facility] initiated but pt terminated due to feeling like his skin was getting pinched, fatigued. Attempted R BKA prosthetic as well but unable to safely wear without sock or pin. maxAx2 to return to EOB and towards [facility] via lateral scoot. Assisted pt to sidely on his L prior to exit. The patient would benefit from further skilled PT intervention to continue to progress towards goals.    If plan is discharge home, recommend the following: Two people to help with walking and/or transfers;Two people to help with bathing/dressing/bathroom   Can travel by private vehicle     No  Equipment Recommendations  Other (comment) (TBD)    Recommendations for Other Services       Precautions / Restrictions Precautions Precautions: Fall Recall of Precautions/Restrictions: Impaired Precaution/Restrictions Comments: chronic R BKA, acute L AKA, L transradial amputation, foley Restrictions Weight Bearing Restrictions Per Provider Order: No     Mobility  Bed Mobility Overal bed mobility:  Needs Assistance Bed Mobility: Sit to Supine, Rolling Rolling: Min assist, +2 for physical assistance, Used rails Sidelying to sit: Min assist, HOB elevated, Used rails Supine to sit: Mod assist, Used rails          Transfers Overall transfer level: Needs assistance   Transfers: Bed to chair/wheelchair/BSC            Lateral/Scoot Transfers: Contact guard assist, Max assist, +2 physical assistance General transfer comment: CGA to scoot L and R at EOB, with fatigue increased to maxAx2 to safely scoot back onto EOB and towards [facility]    Ambulation/Gait                   Stairs             Wheelchair Mobility     Tilt Bed    Modified Rankin (Stroke Patients Only)       Balance Overall balance assessment: Needs assistance Sitting-balance support: No upper extremity supported Sitting balance-Leahy Scale: Good                                      Communication Communication Communication: No apparent difficulties  Cognition Arousal: Alert Behavior During Therapy: WFL for tasks assessed/performed                             Following commands: Intact      Cueing Cueing Techniques: Verbal cues, Gestural cues  Exercises      General Comments  Pertinent Vitals/Pain Pain Assessment Pain Assessment: Faces Faces Pain Scale: Hurts little more Pain Location: LLE Pain Descriptors / Indicators: Aching, Discomfort, Grimacing Pain Intervention(s): Limited activity within patient's tolerance, Monitored during session, Repositioned    Home Living                          Prior Function            PT Goals (current goals can now be found in the care plan section) Progress towards PT goals: Progressing toward goals    Frequency    Min 3X/week      PT Plan      Co-evaluation PT/OT/SLP Co-Evaluation/Treatment: Yes Reason for Co-Treatment: For patient/therapist safety;To address functional/ADL  transfers PT goals addressed during session: Mobility/safety with mobility;Balance OT goals addressed during session: ADL's and self-care;Proper use of Adaptive equipment and DME      AM-PAC PT 6 Clicks Mobility   Outcome Measure  Help needed turning from your back to your side while in a flat bed without using bedrails?: A Lot Help needed moving from lying on your back to sitting on the side of a flat bed without using bedrails?: Total Help needed moving to and from a bed to a chair (including a wheelchair)?: Total Help needed standing up from a chair using your arms (e.g., wheelchair or bedside chair)?: Total Help needed to walk in hospital room?: Total Help needed climbing 3-5 steps with a railing? : Total 6 Click Score: 7    End of Session Equipment Utilized During Treatment: Gait belt Activity Tolerance: Patient limited by fatigue Patient left: in bed;with call bell/phone within reach;with nursing/sitter in room Nurse Communication: Mobility status PT Visit Diagnosis: Unsteadiness on feet (R26.81);Other abnormalities of gait and mobility (R26.89);Repeated falls (R29.6);Muscle weakness (generalized) (M62.81);Difficulty in walking, not elsewhere classified (R26.2)     Time: 1325-1403 PT Time Calculation (min) (ACUTE ONLY): 38 min  Charges:    $Therapeutic Activity: 8-22 mins PT General Charges $$ ACUTE PT VISIT: 1 Visit                    Doyal Shams PT, DPT 2:56 PM,09/12/23

## 2023-09-13 DIAGNOSIS — M86172 Other acute osteomyelitis, left ankle and foot: Secondary | ICD-10-CM | POA: Diagnosis not present

## 2023-09-13 LAB — CBC
HCT: 21.6 % — ABNORMAL LOW (ref 39.0–52.0)
Hemoglobin: 6.5 g/dL — ABNORMAL LOW (ref 13.0–17.0)
MCH: 22.5 pg — ABNORMAL LOW (ref 26.0–34.0)
MCHC: 30.1 g/dL (ref 30.0–36.0)
MCV: 74.7 fL — ABNORMAL LOW (ref 80.0–100.0)
Platelets: 338 K/uL (ref 150–400)
RBC: 2.89 MIL/uL — ABNORMAL LOW (ref 4.22–5.81)
RDW: 20.8 % — ABNORMAL HIGH (ref 11.5–15.5)
WBC: 8.2 K/uL (ref 4.0–10.5)
nRBC: 1 % — ABNORMAL HIGH (ref 0.0–0.2)

## 2023-09-13 LAB — GLUCOSE, CAPILLARY
Glucose-Capillary: 142 mg/dL — ABNORMAL HIGH (ref 70–99)
Glucose-Capillary: 123 mg/dL — ABNORMAL HIGH (ref 70–99)
Glucose-Capillary: 119 mg/dL — ABNORMAL HIGH (ref 70–99)

## 2023-09-13 LAB — PREPARE RBC (CROSSMATCH)

## 2023-09-13 MED ORDER — FUROSEMIDE 10 MG/ML IJ SOLN
40.0000 mg | Freq: Once | INTRAMUSCULAR | Status: AC
  Administered 2023-09-13: 40 mg via INTRAVENOUS
  Filled 2023-09-13: qty 4

## 2023-09-13 MED ORDER — SODIUM CHLORIDE 0.9% IV SOLUTION: Freq: Once | INTRAVENOUS | Status: AC

## 2023-09-13 MED ORDER — AMOXICILLIN-POT CLAVULANATE 875-125 MG PO TABS: 1.0000 | ORAL_TABLET | Freq: Two times a day (BID) | ORAL | 0 refills | Status: AC

## 2023-09-13 NOTE — OR Nursing (Signed)
  Dr.Sudheer and 2A Charge Nurse - Norleen Pander, informed of the following:    1000: Patient refused the following medications: Ampicillin  IV, (stated he doesn't have infection), Insulin  5 units (stated he won't take Insulin  unless his blood glucose is >150), Robaxin , Juven, Oxycodone  - scheduled, Miralax , and Senokot, (stated he doesn't have constipation issues at present). Patient signed blood consent but still was not sure about receiving the blood transfusion. Patient was informed his current Hgb. = 6.5.  1045: Norleen Pander, 2A, CN re-assessed patient for Ampicillin  IV administration. Patient now agrees to receive medication. Ampicillin  IV started at 1045.  1150: Re-evaluated patient for blood transfusion administration. Patient stated in a raised and anxious voice/tone, he does not want to receive the blood transfusion. He further stated he just wants to go home and feels fine.

## 2023-09-13 NOTE — Progress Notes (Signed)
 Patient leaving via EMS transport today. Discharge instructions reviewed with patient and EMS.

## 2023-09-13 NOTE — TOC Progression Note (Addendum)
 Transition of Care [facility]) - Progression Note    Patient Details  Name: Russell [name] MRN: [id number] Date of Birth: 1965/12/27  Transition of Care [facility] CM/SW Contact  Tomasa JAYSON Childes, RN Phone Number: 09/13/2023, 1:09 PM  Clinical Narrative:    Retrieved a message from patient's wife stating she would like him in a facility close to her.   Spoke with patient at the bedside. Patient became very upset at the mention of SNF. I can't get ya'll to do nothing in here so I'm not going to a rehab, and it's my choice! Patient was advised of safety, and that his wife had called about SNF for him as well. Patient called his wife and put her on speaker.  Patient inquired with his wife why she asked about SNF when he had already told her he was going home. Patient wife stated she thought he wanted to go to a rehab. Patient wife stated patient can come home and that she had cleaned and gotten everything ready for him. Patient mentioned the names of two gentlemen that would assit with his care. Patient states he has all needed DME including a wheelchair.RNCM advised of level of care patient requires. Patient is adamant he is going home. He has refused HH stating he will call [employer] and his MD if he needs any additional help. He denied any other questions. Patient requested his bed be turned catty corned. He was advised to push his call button for assistance.  MD made aware of patient's choice to return home.   3:40pm Request for hospital bed, trapeze and hoyer lift sent to [facility] from [employer]. DME may not be delivered today. Face sheet and medical necessity forms printed to floor to be added to EMS packet.   4:29pm EMS arranged for 6:30 pm  Attempt to reach patient's wife. No answer. Unable to leave a message.   TOC signing off.        Barriers to Discharge: Continued Medical Work up  Expected Discharge Plan and Services In-house Referral: Clinical Social Work     Living arrangements for the past 2  months: Single Family Home                                       Social Determinants of Health (SDOH) Interventions SDOH Screenings   Food Insecurity: Unknown (09/07/2023)  Housing: Low Risk  (09/07/2023)  Transportation Needs: No Transportation Needs (09/07/2023)  Utilities: Not At Risk (09/07/2023)  Financial Resource Strain: Low Risk  (04/04/2023)   Received from [facility]  Tobacco Use: Low Risk  (09/07/2023)    Readmission Risk Interventions    03/03/2023   12:27 PM 10/25/2022    3:55 PM  Readmission Risk Prevention Plan  Transportation Screening Complete Complete  PCP or Specialist Appt within 3-5 Days  Complete  HRI or Home Care Consult  Complete  Social Work Consult for Recovery Care Planning/Counseling  Complete  Palliative Care Screening  Not Applicable  Medication Review [employer]) Complete Complete  PCP or Specialist appointment within 3-5 days of discharge Complete   HRI or Home Care Consult Complete   Palliative Care Screening Not Applicable   Skilled Nursing Facility Not Applicable

## 2023-09-13 NOTE — Discharge Summary (Signed)
 Physician Discharge Summary  Russell [name] FMW:[id number] DOB: Jul 18, 1965 DOA: 09/06/2023  PCP: Eliverto [name], MD  Admit date: 09/06/2023 Discharge date: 09/13/2023  Admitted From: Home Disposition:  Home  Recommendations for Outpatient Follow-up:  Follow up with PCP in 1-2 weeks   Home Health:No (offered, patient refused)  Equipment/Devices:Bariatric hospital bed, hoyer lift, trapeze  Discharge Condition:Stable  CODE STATUS:Full  Diet recommendation: Carb mod  Brief/Interim Summary:  HPI: Patient with PMH of decubitus ulcer of sacrum and left foot ulcer, left hand osteomyelitis S/P transradial amputation in 2/25, right BKA, PAD, history of GI bleed, history of a flutter, HTN, morbid obesity, OSA not on CPAP, depression, uncontrolled type II DM with neuropathy,chronic HFpEF, chronic back pain, presented to the hospital with foul-smelling wound infection on his left foot. 5/25 was seen at [facility] ER for left foot ulcer.  Was prescribed pain medication.  Was referred to podiatry.  Also had difficulty with urination and was referred to urology. Since then the patient has been doing dressing changes.  Has not seen podiatry or urology.  At [facility]. Presents with concern that the 1 ulcer that he had in his foot is now turning into multiple ulcers and they are getting worse.  Denies any fever or chills.  No nausea no vomiting.  No fall no trauma no injury reported recently. Had a fall 4 months ago since then he reports he has some chronic sacral pain and unable to sit up in the wheelchair and has been laying on his side. Has not seen PCP as well lately. Ran out of his insulin  and is only using [employer] brand short acting insulin . Reports dietary indiscretion with regards to his diabetes and therefore reports he has to occasionally use 80 units of insulin  a day. He reports that he has chronic neuropathy and does not feel any sensation in his legs below the knee.  If I break my leg, I  will not feel it. He feels like he has some shortness of breath secondary to swelling. He does not use CPAP at nighttime.  Does not use oxygen at home. Denies any SI.   On day of discharge patient became irate.  Calling the police multiple times.  Demanding to leave the hospital.  I had a lengthy discussion with the patient the with charge RN Norleen Pander at bedside.  Patient's spouse and daughter-in-law were also present at bedside.  I explained the reasons for the patient's continued hospitalization.  I explained that we have a strong recommendation for skilled nursing facility and that we were extreme concerned about his safety at home.  In addition the patient did have a postoperative drop in his hemoglobin to 6.5 warranting blood transfusion.  He also had a Foley catheter in place which needed to be removed and patient was able to successfully void post catheter removal.  Patient did agree to blood transfusion.  He will discharge home in the care of his family.  He adamantly refuses home health services despite my repeated encouragement as well as the encouragement of physical and Occupational Therapy to agree to skilled nursing facility.  His safety at home is greatly in question.  However he is decisional and understands the potential consequences of his actions.  Patient's wife and daughter-in-law are also in agreement.  Patient is very high risk for subsequent deterioration and hospital readmission.  I have notified vascular surgery as well.     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 Concern for soft tissue gas. Discussed with podiatry Dr. Ashley.  Appreciate consultation. Recommend MRI foot. CK mildly elevated.  655 ESR 92.  CRP pending.  Meeting SIRS criteria with leukocytosis and tachypnea with evidence of infection in her foot. Most likely will require amputation.  Patient is aware. Plan: Status post AKA.  Postoperative day # 6.  Postoperative pain control has been quite challenging.  Remains to be a major fall risk.  At time of discharge she adamantly refused narcotic administration.  He is having bowel movements.  We have made a strong recommendation for skilled nursing facility however patient is decisional and he demands to go home.  He also elected to decline home health services p.  He stated he will reach out to his primary care physician postdischarge if needed.    I tried to explain that his primary care physician would have a difficulty setting up some of the services after discharge.  He continued adamantly refused and demanded to discharge home.  Very high risk for hospital readmission  Discharge Instructions  Discharge Instructions     Amb Referral to Nutrition and Diabetic Education   Complete by: As directed    Amb ref to Medical Nutrition Therapy-MNT   Complete by: As directed    Diet - low sodium heart healthy   Complete by: As directed    Discharge wound care:   Complete by: As directed    Wound care  Daily      Comments: Vascular surgery dressing changes to AKA   Removal dressing   Cover incision line with Xeroform gauze then   Cover with 4 x 4's then   Cover with ABD pads then   Wrap with Kerlix then   With Ace bandage snugly to help reduce swelling.   These dressing changes are daily dressing changes.   Increase activity  slowly   Complete by: As directed       Allergies as of 09/13/2023       Reactions   Fluoxetine    Other reaction(s): Hallucination   Shellfish Allergy Hives   Sulfa Antibiotics Hives   Vancomycin     Other reaction(s): Red Man Syndrome   Metformin Nausea Only   Milk-related Compounds Other (See Comments)   GI upset        Medication List     STOP taking these medications    ketorolac  0.5 % ophthalmic solution Commonly known as: ACULAR    ofloxacin 0.3 % ophthalmic solution Commonly known as: OCUFLOX   pantoprazole  40 MG tablet Commonly known as: PROTONIX    prednisoLONE acetate 1 % ophthalmic suspension Commonly known as: PRED FORTE       TAKE these medications    acetaminophen  325 MG tablet Commonly known as: TYLENOL  Take 2 tablets (650 mg total) by mouth every 6 (six) hours as needed for mild pain (pain score 1-3) (or Fever >/= 101).   amoxicillin -clavulanate  875-125 MG tablet Commonly known as: AUGMENTIN  Take 1 tablet by mouth 2 (two) times daily for 3 days.   aspirin  EC 81 MG tablet Take 81 mg by mouth.  Take 1 tablet (81 mg total) by mouth once daily   atorvastatin  40 MG tablet Commonly known as: LIPITOR Take 40 mg by mouth daily.   baclofen  10 MG tablet Commonly known as: LIORESAL  Take by mouth.  One tablet, oral, Every 6 Hours - PRN   buPROPion  150 MG 24 hr tablet Commonly known as: WELLBUTRIN  XL Take 1 tablet (150 mg total) by mouth daily.   cetirizine 10 MG tablet Commonly known as: ZYRTEC Take 10 mg by mouth daily as needed for allergies.   diphenhydrAMINE  25 mg capsule Commonly known as: BENADRYL  Take 1 capsule (25 mg total) by mouth every 6 (six) hours as needed for allergies or itching (30 minutes prior to starting Vancomycin ).   DULoxetine  30 MG capsule Commonly known as: CYMBALTA  Take 90 mg by mouth daily.   finasteride  5 MG tablet Commonly known as: PROSCAR  Take 5 mg by mouth daily.   furosemide  80 MG tablet Commonly  known as: LASIX  Take 80 mg by mouth 2 (two) times daily.   HumuLIN  R 100 UNIT/ML injection Generic drug: insulin  regular Inject 0.1 mLs (10 Units total) into the skin 3 (three) times daily with meals.   Insulin  Aspart FlexPen 100 UNIT/ML Commonly known as: NOVOLOG  Inject into the skin.  Inject subcutaneously Before meals and at bedtime, sliding scale   insulin  glargine-yfgn 100 UNIT/ML injection Commonly known as: SEMGLEE  Inject 0.2 mLs (20 Units total) into the skin at bedtime.   Insulin  Pen Needle 33G X 5 MM Misc 1 Dose by Does not apply route 2 (two) times daily.   melatonin 5 MG Tabs Take 1 tablet (5 mg total) by mouth at bedtime.   metoprolol  succinate 100 MG 24 hr tablet Commonly known as: TOPROL -XL Take 100 mg by mouth daily.   Oxycodone  HCl 10 MG Tabs Take by mouth.   polyethylene glycol 17 g packet Commonly known as: MIRALAX  / GLYCOLAX  Take 17 g by mouth daily as needed.   potassium chloride  10 MEQ CR capsule Commonly known as: MICRO-K  Take by mouth 2 (two) times daily. :1 Tablet(s) By Mouth Twice Daily   pregabalin  150 MG capsule Commonly known as: LYRICA  Take 150 mg by mouth 3 (three) times daily.   senna-docusate 8.6-50 MG tablet Commonly known as: Senokot-S Take 2 tablets by mouth daily as needed for mild constipation.   tamsulosin  0.4 MG Caps capsule Commonly known as: FLOMAX  Take 0.8 mg by mouth daily.               Durable Medical Equipment  (From admission, onward)           Start     Ordered   09/13/23 1325  For home use only DME Trapeze  Once       Question:  Length of Need  Answer:  Lifetime   09/13/23 1324   09/13/23 1325  For home use only DME Other see comment  Once       Comments: Deitra lift  Question:  Length of Need  Answer:  Lifetime   09/13/23 1324   09/13/23 1324  For home use only DME Hospital bed  Once       Question Answer Comment  Length of Need Lifetime   Patient has (list medical condition): Gas gangrene.   Morbid obesity  The above medical condition requires: Patient requires the ability to reposition frequently   Bed type Heavy-duty, semi-electric (for patients >350 lbs.)   Support Surface: Low Air loss Mattress      09/13/23 1324              Discharge Care Instructions  (From admission, onward)           Start     Ordered   09/13/23 0000  Discharge wound care:       Comments: Wound care  Daily      Comments: Vascular surgery dressing changes to AKA   Removal dressing   Cover incision line with Xeroform gauze then   Cover with 4 x 4's then   Cover with ABD pads then   Wrap with Kerlix then   With Ace bandage snugly to help reduce swelling.   These dressing changes are daily dressing changes.   09/13/23 1643            Follow-up Information     Brown, Fallon E, NP Follow up in 4 week(s).   Specialty: Vascular Surgery Why: Post Op staple removal. Contact information: [address] [place] [address] [phone]                Allergies  Allergen Reactions   Fluoxetine     Other reaction(s): Hallucination   Shellfish Allergy Hives   Sulfa Antibiotics Hives   Vancomycin      Other reaction(s): Red Man Syndrome   Metformin Nausea Only   Milk-Related Compounds Other (See Comments)    GI upset    Consultations: Vascular surgery   Procedures/Studies: DG Abd 1 View Result Date: 09/10/2023 CLINICAL DATA:  Constipation. EXAM: ABDOMEN - 1 VIEW COMPARISON:  11/21/2020 FINDINGS: No evidence of dilated bowel loops. Solid food stuff noted in stomach. No significant colonic stool burden seen. IMPRESSION: No acute findings.  No significant colonic stool burden. Electronically Signed   By: Norleen DELENA Kil M.D.   On: 09/10/2023 11:40   MR FOOT LEFT W WO CONTRAST Result Date: 09/06/2023 CLINICAL DATA:  Diabetic foot wound. EXAM: MRI OF THE LEFT FOREFOOT WITHOUT AND WITH CONTRAST TECHNIQUE: Multiplanar, multisequence MR imaging of the  left forefoot was performed both before and after administration of intravenous contrast. CONTRAST:  10mL GADAVIST  GADOBUTROL  1 MMOL/ML IV SOLN COMPARISON:  Same day radiographs of the left foot dated 09/06/2023 at 7:16 a.m. FINDINGS: Bones/Joint/Cartilage Fracture of the base of the fifth metatarsal apophysis with erosive change along the lateral plantar margin. There is confluent T1 hypointense marrow signal abnormality about the fracture margins with mild T2/STIR hyperintensity and enhancement on postcontrast sequences. These findings are favored to reflect a subacute to chronic mildly displaced fracture of the fifth metatarsal base with osteomyelitis. No convincing marrow signal abnormality identified elsewhere to suggest osteomyelitis. Mild first MTP osteoarthritis. Moderate first through fifth TMT joint space narrowing. No joint effusion. Ligaments Collateral ligaments are intact.  Lisfranc ligament is intact. Muscles and Tendons Flexor and extensor compartment tendons are intact. Diffuse fatty atrophy of the intrinsic musculature of the foot likely reflects chronic denervation changes. Soft tissue Soft tissue ulceration/wound extending along the lateral midfoot extends deep to the lateral plantar margin of the underlying fifth metatarsal base. There is surrounding soft tissue edema and cutaneous thickening with enhancement, consistent with cellulitis. No loculated fluid collection. Subcutaneous edema extending along the dorsal foot most pronounced laterally. A 4 mm nonenhancing T2 hyperintense cystic structure adjacent to the  plantar aspect of the third digit flexor tendon at the level of the third PIP joint likely reflects a ganglion cyst (series 5, image 40). IMPRESSION: 1. Soft tissue ulceration/wound extending along the lateral midfoot extends deep to the lateral plantar margin of the underlying fifth metatarsal base. There is a mildly displaced likely subacute to chronic fracture of the base of the fifth  metatarsal with erosive change and marrow signal abnormality concerning for subacute to chronic osteomyelitis. 2. Soft tissue wound along lateral midfoot with surrounding edema and enhancement, consistent with cellulitis. No abscess. 3. Moderate first through fifth TMT joint space narrowing. 4. Diffuse fatty atrophy of the intrinsic musculature of the foot likely reflects chronic denervation changes. Electronically Signed   By: Harrietta Sherry M.D.   On: 09/06/2023 13:38   DG Chest Portable 1 View Result Date: 09/06/2023 EXAM: 1 VIEW XRAY OF THE CHEST 09/06/2023 07:16:34 AM COMPARISON: 10/23/2022 CLINICAL HISTORY: Shortness of breath, leg swelling, and wounds on legs. Patient unable to rotate or move leg. FINDINGS: LUNGS AND PLEURA: Mild dependent atelectasis is present at the left base. No airspace consolidation is present. No pleural effusion. No pneumothorax. HEART AND MEDIASTINUM: No acute abnormality of the cardiac and mediastinal silhouettes. BONES AND SOFT TISSUES: No acute osseous abnormality. IMPRESSION: 1. No acute findings. 2. Mild dependent atelectasis at the left base. Electronically signed by: Lonni Necessary MD 09/06/2023 07:42 AM EDT RP Workstation: [id number]   DG Foot Complete Left Result Date: 09/06/2023 EXAM: 3 or more VIEW(S) XRAY OF THE LEFT FOOT 09/06/2023 07:16:34 AM COMPARISON: None available. CLINICAL HISTORY: Shortness of breath, leg swelling, wounds on legs, and inability to rotate or move leg. FINDINGS: BONES AND JOINTS: A remote healed distal fibular fracture is present. Erosive changes are present at the base of the fifth metatarsal. No other acute osseous changes are present. SOFT TISSUES: Extensive soft tissue swelling is present about the foot. Ulceration and soft tissue gas was present over the lateral and plantar surface of the foot. IMPRESSION: 1. Extensive soft tissue swelling with ulceration and soft tissue gas over the lateral and plantar surface of the foot. 2.  Erosive changes at the base of the fifth metatarsal are concerning for osteomyelitis. 3. Remote healed distal fibular fracture. Electronically signed by: Lonni Necessary MD 09/06/2023 07:40 AM EDT RP Workstation: [id number]      Subjective: Seen and examined on day of discharge.  Medically stable.  High risk but decisional and elects to discharge home.  Discharge Exam: Vitals:   09/13/23 0759 09/13/23 1622  BP: 136/61 (!) 154/66  Pulse: 73 71  Resp: 18 20  Temp: 98.1 F (36.7 C) 98.4 F (36.9 C)  SpO2: 93% 93%   Vitals:   09/13/23 0019 09/13/23 0432 09/13/23 0759 09/13/23 1622  BP: 132/75 (!) 141/65 136/61 (!) 154/66  Pulse: 72 76 73 71  Resp: 18 20 18 20   Temp: 98.2 F (36.8 C)  98.1 F (36.7 C) 98.4 F (36.9 C)  TempSrc: Oral  Oral Oral  SpO2: 94% 90% 93% 93%  Weight:      Height:        General: Pt is alert, awake, not in acute distress Cardiovascular: RRR, S1/S2 +, no rubs, no gallops Respiratory: CTA bilaterally, no wheezing, no rhonchi Abdominal: Soft, NT, ND, bowel sounds + Extremities: Bilateral lower extremity amputations.  Left upper extremity amputation    The results of significant diagnostics from this hospitalization (including imaging, microbiology, ancillary and laboratory) are listed below for reference.  Microbiology: Recent Results (from the past 240 hours)  Culture, blood (Routine x 2)     Status: None   Collection Time: 09/06/23  6:48 AM   Specimen: BLOOD RIGHT ARM  Result Value Ref Range Status   Specimen Description BLOOD RIGHT ARM  Final   Special Requests   Final    BOTTLES DRAWN AEROBIC AND ANAEROBIC Blood Culture adequate volume   Culture   Final    NO GROWTH 5 DAYS Performed at [facility], [address] [place], [place] [address]    Report Status 09/11/2023 FINAL  Final  Culture, blood (Routine x 2)     Status: None   Collection Time: 09/06/23  9:16 AM   Specimen: BLOOD  Result Value Ref Range Status    Specimen Description BLOOD BLOOD RIGHT WRIST  Final   Special Requests   Final    BOTTLES DRAWN AEROBIC AND ANAEROBIC Blood Culture adequate volume   Culture   Final    NO GROWTH 5 DAYS Performed at [facility], [address] [place], [place] [address]    Report Status 09/11/2023 FINAL  Final  Aerobic Culture w Gram Stain (superficial specimen)     Status: None   Collection Time: 09/06/23 10:18 AM   Specimen: Foot; Wound  Result Value Ref Range Status   Specimen Description   Final    FOOT Performed at [facility], [address], [place], [place] [address]    Special Requests   Final    LT FOOT Performed at [facility], [address] [place], [place] [address]    Gram Stain   Final    NO WBC SEEN RARE GRAM NEGATIVE RODS FEW GRAM POSITIVE COCCI IN PAIRS Performed at [facility], [address] [place], [place] [address]    Culture   Final    ABUNDANT PROTEUS MIRABILIS ABUNDANT METHICILLIN RESISTANT STAPHYLOCOCCUS AUREUS ABUNDANT ENTEROCOCCUS FAECALIS ESCHERICHIA COLI Confirmed Extended Spectrum Beta-Lactamase Producer (ESBL).  In bloodstream infections from ESBL organisms, carbapenems are preferred over piperacillin /tazobactam. They are shown to have a lower risk of mortality.    Report Status 09/10/2023 FINAL  Final   Organism ID, Bacteria PROTEUS MIRABILIS  Final   Organism ID, Bacteria METHICILLIN RESISTANT STAPHYLOCOCCUS AUREUS  Final   Organism ID, Bacteria ENTEROCOCCUS FAECALIS  Final   Organism ID, Bacteria ESCHERICHIA COLI  Final      Susceptibility   Escherichia coli - MIC*    AMPICILLIN  >=32 RESISTANT Resistant     CEFEPIME  8 INTERMEDIATE Intermediate     CEFTAZIDIME RESISTANT Resistant     CEFTRIAXONE  >=64 RESISTANT Resistant     CIPROFLOXACIN  >=4 RESISTANT Resistant     GENTAMICIN <=1 SENSITIVE Sensitive     IMIPENEM <=0.25 SENSITIVE Sensitive     TRIMETH/SULFA <=20 SENSITIVE Sensitive     AMPICILLIN /SULBACTAM  >=32 RESISTANT Resistant     PIP/TAZO 8 SENSITIVE Sensitive ug/mL    * ESCHERICHIA COLI   Enterococcus faecalis - MIC*    AMPICILLIN  <=2 SENSITIVE Sensitive     VANCOMYCIN  2 SENSITIVE Sensitive     GENTAMICIN SYNERGY SENSITIVE Sensitive     * ABUNDANT ENTEROCOCCUS FAECALIS   Methicillin resistant staphylococcus aureus - MIC*    CIPROFLOXACIN  >=8 RESISTANT Resistant     ERYTHROMYCIN >=8 RESISTANT Resistant     GENTAMICIN <=0.5 SENSITIVE Sensitive     OXACILLIN >=4 RESISTANT Resistant     TETRACYCLINE <=1 SENSITIVE Sensitive     VANCOMYCIN  <=0.5 SENSITIVE  Sensitive     TRIMETH/SULFA >=320 RESISTANT Resistant     CLINDAMYCIN  <=0.25 SENSITIVE Sensitive     RIFAMPIN <=0.5 SENSITIVE Sensitive     Inducible Clindamycin  NEGATIVE Sensitive     LINEZOLID  2 SENSITIVE Sensitive     * ABUNDANT METHICILLIN RESISTANT STAPHYLOCOCCUS AUREUS   Proteus mirabilis - MIC*    AMPICILLIN  <=2 SENSITIVE Sensitive     CEFEPIME  <=0.12 SENSITIVE Sensitive     CEFTAZIDIME <=1 SENSITIVE Sensitive     CEFTRIAXONE  <=0.25 SENSITIVE Sensitive     CIPROFLOXACIN  <=0.25 SENSITIVE Sensitive     GENTAMICIN <=1 SENSITIVE Sensitive     IMIPENEM 2 SENSITIVE Sensitive     TRIMETH/SULFA <=20 SENSITIVE Sensitive     AMPICILLIN /SULBACTAM <=2 SENSITIVE Sensitive     PIP/TAZO <=4 SENSITIVE Sensitive ug/mL    * ABUNDANT PROTEUS MIRABILIS  MRSA Next Gen by PCR, Nasal     Status: Abnormal   Collection Time: 09/07/23  4:41 AM   Specimen: Nasal Mucosa; Nasal Swab  Result Value Ref Range Status   MRSA by PCR Next Gen DETECTED (A) NOT DETECTED Final    Comment: RESULT CALLED TO, READ BACK BY AND VERIFIED WITH: ALLEAN BROCKS RN [phone] 09/07/23 HNM (NOTE) The GeneXpert MRSA Assay (FDA approved for NASAL specimens only), is one component of a comprehensive MRSA colonization surveillance program. It is not intended to diagnose MRSA infection nor to guide or monitor treatment for MRSA infections. Test performance is not FDA approved in  patients less than 15 years old. Performed at [facility], [address] [place], [place] [address]      Labs: BNP (last 3 results) Recent Labs    10/12/22 2216 10/23/22 2242  BNP 47.0 190.6*   Basic Metabolic Panel: Recent Labs  Lab 09/06/23 2016 09/07/23 0412 09/08/23 0918  NA 129* 132* 130*  K 3.9 3.7 4.7  CL 97* 98 99  CO2 23 23 24   GLUCOSE 415* 189* 362*  BUN 26* 26* 29*  CREATININE 1.00 1.08 1.21  CALCIUM  8.1* 8.1* 7.5*   Liver Function Tests: No results for input(s): AST, ALT, ALKPHOS, BILITOT, PROT, ALBUMIN  in the last 168 hours. No results for input(s): LIPASE, AMYLASE in the last 168 hours. No results for input(s): AMMONIA in the last 168 hours. CBC: Recent Labs  Lab 09/07/23 0412 09/07/23 2047 09/08/23 0405 09/13/23 0528  WBC 11.2* 10.4 12.1* 8.2  HGB 8.0* 8.6* 7.6* 6.5*  HCT 26.5* 27.9* 24.2* 21.6*  MCV 69.0* 73.6* 72.9* 74.7*  PLT 241 256 258 338   Cardiac Enzymes: No results for input(s): CKTOTAL, CKMB, CKMBINDEX, TROPONINI in the last 168 hours. BNP: Invalid input(s): POCBNP CBG: Recent Labs  Lab 09/12/23 1144 09/12/23 1613 09/13/23 0115 09/13/23 0437 09/13/23 0801  GLUCAP 133* 83 142* 123* 119*   D-Dimer No results for input(s): DDIMER in the last 72 hours. Hgb A1c No results for input(s): HGBA1C in the last 72 hours. Lipid Profile No results for input(s): CHOL, HDL, LDLCALC, TRIG, CHOLHDL, LDLDIRECT in the last 72 hours. Thyroid  function studies No results for input(s): TSH, T4TOTAL, T3FREE, THYROIDAB in the last 72 hours.  Invalid input(s): FREET3 Anemia work up No results for input(s): VITAMINB12, FOLATE, FERRITIN, TIBC, IRON , RETICCTPCT in the last 72 hours. Urinalysis    Component Value Date/Time   COLORURINE YELLOW (A) 11/20/2016 1307   APPEARANCEUR CLEAR (A) 11/20/2016 1307   APPEARANCEUR Clear 08/13/2013 1624   LABSPEC 1.022 11/20/2016  1307   LABSPEC 1.012 08/13/2013 1624  PHURINE 6.0 11/20/2016 1307   GLUCOSEU >=500 (A) 11/20/2016 1307   GLUCOSEU 150 mg/dL 93/84/7984 8375   HGBUR SMALL (A) 11/20/2016 1307   BILIRUBINUR NEGATIVE 11/20/2016 1307   BILIRUBINUR Negative 08/13/2013 1624   KETONESUR 5 (A) 11/20/2016 1307   PROTEINUR NEGATIVE 11/20/2016 1307   NITRITE NEGATIVE 11/20/2016 1307   LEUKOCYTESUR NEGATIVE 11/20/2016 1307   LEUKOCYTESUR Negative 08/13/2013 1624   Sepsis Labs Recent Labs  Lab 09/07/23 0412 09/07/23 2047 09/08/23 0405 09/13/23 0528  WBC 11.2* 10.4 12.1* 8.2   Microbiology Recent Results (from the past 240 hours)  Culture, blood (Routine x 2)     Status: None   Collection Time: 09/06/23  6:48 AM   Specimen: BLOOD RIGHT ARM  Result Value Ref Range Status   Specimen Description BLOOD RIGHT ARM  Final   Special Requests   Final    BOTTLES DRAWN AEROBIC AND ANAEROBIC Blood Culture adequate volume   Culture   Final    NO GROWTH 5 DAYS Performed at [facility], [address] [place], [place] [address]    Report Status 09/11/2023 FINAL  Final  Culture, blood (Routine x 2)     Status: None   Collection Time: 09/06/23  9:16 AM   Specimen: BLOOD  Result Value Ref Range Status   Specimen Description BLOOD BLOOD RIGHT WRIST  Final   Special Requests   Final    BOTTLES DRAWN AEROBIC AND ANAEROBIC Blood Culture adequate volume   Culture   Final    NO GROWTH 5 DAYS Performed at [facility], [address], [place], [place] [address]    Report Status 09/11/2023 FINAL  Final  Aerobic Culture w Gram Stain (superficial specimen)     Status: None   Collection Time: 09/06/23 10:18 AM   Specimen: Foot; Wound  Result Value Ref Range Status   Specimen Description   Final    FOOT Performed at [facility], [address], [place], [place] [address]    Special Requests   Final    LT FOOT Performed at [facility], [address] [place], [place]  [address]    Gram Stain   Final    NO WBC SEEN RARE GRAM NEGATIVE RODS FEW GRAM POSITIVE COCCI IN PAIRS Performed at [facility], [address], [place], [place] [address]    Culture   Final    ABUNDANT PROTEUS MIRABILIS ABUNDANT METHICILLIN RESISTANT STAPHYLOCOCCUS AUREUS ABUNDANT ENTEROCOCCUS FAECALIS ESCHERICHIA COLI Confirmed Extended Spectrum Beta-Lactamase Producer (ESBL).  In bloodstream infections from ESBL organisms, carbapenems are preferred over piperacillin /tazobactam. They are shown to have a lower risk of mortality.    Report Status 09/10/2023 FINAL  Final   Organism ID, Bacteria PROTEUS MIRABILIS  Final   Organism ID, Bacteria METHICILLIN RESISTANT STAPHYLOCOCCUS AUREUS  Final   Organism ID, Bacteria ENTEROCOCCUS FAECALIS  Final   Organism ID, Bacteria ESCHERICHIA COLI  Final      Susceptibility   Escherichia coli - MIC*    AMPICILLIN  >=32 RESISTANT Resistant     CEFEPIME  8 INTERMEDIATE Intermediate     CEFTAZIDIME RESISTANT Resistant     CEFTRIAXONE  >=64 RESISTANT Resistant     CIPROFLOXACIN  >=4 RESISTANT Resistant     GENTAMICIN <=1 SENSITIVE Sensitive     IMIPENEM <=0.25 SENSITIVE Sensitive     TRIMETH/SULFA <=20 SENSITIVE Sensitive     AMPICILLIN /SULBACTAM >=32 RESISTANT Resistant     PIP/TAZO 8 SENSITIVE Sensitive ug/mL    * ESCHERICHIA COLI  Enterococcus faecalis - MIC*    AMPICILLIN  <=2 SENSITIVE Sensitive     VANCOMYCIN  2 SENSITIVE Sensitive     GENTAMICIN SYNERGY SENSITIVE Sensitive     * ABUNDANT ENTEROCOCCUS FAECALIS   Methicillin resistant staphylococcus aureus - MIC*    CIPROFLOXACIN  >=8 RESISTANT Resistant     ERYTHROMYCIN >=8 RESISTANT Resistant     GENTAMICIN <=0.5 SENSITIVE Sensitive     OXACILLIN >=4 RESISTANT Resistant     TETRACYCLINE <=1 SENSITIVE Sensitive     VANCOMYCIN  <=0.5 SENSITIVE Sensitive     TRIMETH/SULFA >=320 RESISTANT Resistant     CLINDAMYCIN  <=0.25 SENSITIVE Sensitive     RIFAMPIN <=0.5 SENSITIVE Sensitive      Inducible Clindamycin  NEGATIVE Sensitive     LINEZOLID  2 SENSITIVE Sensitive     * ABUNDANT METHICILLIN RESISTANT STAPHYLOCOCCUS AUREUS   Proteus mirabilis - MIC*    AMPICILLIN  <=2 SENSITIVE Sensitive     CEFEPIME  <=0.12 SENSITIVE Sensitive     CEFTAZIDIME <=1 SENSITIVE Sensitive     CEFTRIAXONE  <=0.25 SENSITIVE Sensitive     CIPROFLOXACIN  <=0.25 SENSITIVE Sensitive     GENTAMICIN <=1 SENSITIVE Sensitive     IMIPENEM 2 SENSITIVE Sensitive     TRIMETH/SULFA <=20 SENSITIVE Sensitive     AMPICILLIN /SULBACTAM <=2 SENSITIVE Sensitive     PIP/TAZO <=4 SENSITIVE Sensitive ug/mL    * ABUNDANT PROTEUS MIRABILIS  MRSA Next Gen by PCR, Nasal     Status: Abnormal   Collection Time: 09/07/23  4:41 AM   Specimen: Nasal Mucosa; Nasal Swab  Result Value Ref Range Status   MRSA by PCR Next Gen DETECTED (A) NOT DETECTED Final    Comment: RESULT CALLED TO, READ BACK BY AND VERIFIED WITH: ALLEAN BROCKS RN [phone] 09/07/23 HNM (NOTE) The GeneXpert MRSA Assay (FDA approved for NASAL specimens only), is one component of a comprehensive MRSA colonization surveillance program. It is not intended to diagnose MRSA infection nor to guide or monitor treatment for MRSA infections. Test performance is not FDA approved in patients less than 11 years old. Performed at [facility], [address] [place], [place] [address]      Time coordinating discharge: 50 minutes  SIGNED:   Calvin KATHEE Robson, MD  [facility] 09/13/2023, 4:44 PM Pager   If 7PM-7AM, please contact night-coverage

## 2023-09-13 NOTE — Progress Notes (Signed)
 Patient has generalized weakness which requires repositioning  positioned in ways not feasible with a normal bed. Generalized weakness frequently requires immediate changes in body position which cannot be achieved with a normal bed.

## 2023-09-13 NOTE — Plan of Care (Signed)

## 2023-09-13 NOTE — Inpatient Diabetes Management (Signed)
 Inpatient Diabetes Program Recommendations  AACE/ADA: New Consensus Statement on Inpatient Glycemic Control  Target Ranges:  Prepandial:   less than 140 mg/dL      Peak postprandial:   less than 180 mg/dL (1-2 hours)      Critically ill patients:  140 - 180 mg/dL    Latest Reference Range & Units 09/12/23 07:52 09/12/23 11:44 09/12/23 16:13 09/13/23 01:15 09/13/23 04:37 09/13/23 08:01  Glucose-Capillary 70 - 99 mg/dL 865 (H) 866 (H) 83 857 (H) 123 (H) 119 (H)   Review of Glycemic Control  Diabetes history: DM2 Outpatient Diabetes medications: Semglee  20 units daily, Novolog  as needed Current orders for Inpatient glycemic control: Semglee  20 units at bedtime, Novolog  0-20 units Q4H, Novolog  5 units TID with meals  NOTE: Inpatient DM team talked with patient on 09/08/23 and he wanted to use CGM sensors.  Per [facility] pharmacy benefit check, patient's insurance covers Dexcom G7 sensors with $0 copay. Went by to talk with patient on 09/12/23 about Dexcom G7 and patient said he was too sleepy to look at it. Patient went ahead and downloaded Dexcom G7 app on his android phone. Asked patient to watch the videos about the Dexcom G7 on the app.  Patient stated yesterday that he was going home from the hospital and not going to SNF.  Spoke with patient at bedside regarding Dexcom G7 CGM sensors. Patient initially sleeping but easily woke up to name. Patient states he is trying to sleep and he does not want to go over the Dexcom G7 CGM sensor right now and that he will look at it at home when he gets ready to start using it. Patient has not watched any of the videos on the Dexcom G7 app.  Explained that he needs to watch each video on the app before he starts using the sensor. Asked patient to allow me to discuss the Dexcom G7 and how to apply a sensor. Educated patient on Dexcom G7 CGM regarding application and changing CGM sensor (alternate every 10 days on back of arms), 30 minute warm-up, how to scan sensor to  start a new sensor, and how to use app to check glucose.   Provided patient with Dexcom G7 sensor sample (1).  Provided educational packet regarding Dexcom G7 CGM. Patient state he can use QR codes. Asked that he use the QR codes on the education packet so he can watch videos on the Dexcom G7 prior to applying a sensor at home.  Patient verbalized understanding of information and has no questions at this time.  Thanks,  Earnie Gainer, RN, MSN, CDCES Diabetes Coordinator Inpatient Diabetes Program [phone] (Team Pager from 8am to 5pm)

## 2023-09-14 LAB — BPAM RBC
Blood Product Expiration Date: 202508192359
ISSUE DATE / TIME: 202507151617
Unit Type and Rh: 5100

## 2023-09-14 LAB — TYPE AND SCREEN
ABO/RH(D): O NEG
Antibody Screen: NEGATIVE
Unit division: 0

## 2023-09-14 NOTE — Anesthesia Postprocedure Evaluation (Signed)
 Anesthesia Post Note  Patient: Russell [name]  Procedure(s) Performed: AMPUTATION, ABOVE KNEE (Left: Knee)  Patient location during evaluation: PACU Anesthesia Type: General Level of consciousness: awake and alert Pain management: pain level controlled Vital Signs Assessment: post-procedure vital signs reviewed and stable Respiratory status: spontaneous breathing, nonlabored ventilation, respiratory function stable and patient connected to nasal cannula oxygen Cardiovascular status: blood pressure returned to baseline and stable Postop Assessment: no apparent nausea or vomiting Anesthetic complications: no   No notable events documented.   Last Vitals:  Vitals:   09/13/23 1656 09/13/23 1849  BP: (!) 153/71 (!) 158/55  Pulse: 70 69  Resp: 20 (!) 22  Temp: 36.5 C 36.7 C  SpO2: 99% 98%    Last Pain:  Vitals:   09/13/23 1849  TempSrc: Axillary  PainSc:                  Prentice Murphy

## 2023-09-16 ENCOUNTER — Emergency Department
Admission: EM | Admit: 2023-09-16 | Discharge: 2023-09-23 | Disposition: A | Discharge status: Home / Self Care | Attending: Emergency Medicine | Admitting: Emergency Medicine

## 2023-09-16 ENCOUNTER — Other Ambulatory Visit: Payer: Self-pay

## 2023-09-16 DIAGNOSIS — M545 Low back pain, unspecified: Secondary | ICD-10-CM | POA: Diagnosis not present

## 2023-09-16 DIAGNOSIS — I10 Essential (primary) hypertension: Secondary | ICD-10-CM | POA: Diagnosis not present

## 2023-09-16 DIAGNOSIS — I110 Hypertensive heart disease with heart failure: Secondary | ICD-10-CM | POA: Diagnosis not present

## 2023-09-16 DIAGNOSIS — E119 Type 2 diabetes mellitus without complications: Secondary | ICD-10-CM | POA: Diagnosis not present

## 2023-09-16 DIAGNOSIS — Z89619 Acquired absence of unspecified leg above knee: Secondary | ICD-10-CM

## 2023-09-16 DIAGNOSIS — W06XXXA Fall from bed, initial encounter: Secondary | ICD-10-CM | POA: Diagnosis not present

## 2023-09-16 DIAGNOSIS — R627 Adult failure to thrive: Secondary | ICD-10-CM | POA: Insufficient documentation

## 2023-09-16 DIAGNOSIS — Z751 Person awaiting admission to adequate facility elsewhere: Secondary | ICD-10-CM | POA: Insufficient documentation

## 2023-09-16 DIAGNOSIS — Z89612 Acquired absence of left leg above knee: Secondary | ICD-10-CM | POA: Diagnosis not present

## 2023-09-16 DIAGNOSIS — W19XXXA Unspecified fall, initial encounter: Secondary | ICD-10-CM

## 2023-09-16 LAB — CBG MONITORING, ED: Glucose-Capillary: 131 mg/dL — ABNORMAL HIGH (ref 70–99)

## 2023-09-16 MED ORDER — OXYCODONE HCL 5 MG PO TABS
10.0000 mg | ORAL_TABLET | Freq: Once | ORAL | Status: AC
  Administered 2023-09-16: 10 mg via ORAL
  Filled 2023-09-16: qty 2

## 2023-09-16 NOTE — ED Provider Notes (Signed)
 [facility] Provider Note    Event Date/Time   First MD Initiated Contact with Patient 09/16/23 2257     (approximate)   History   Fall   HPI Russell [name] is a 58 y.o. 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  Patient had recent hospitalization for infection in his leg requiring further surgical debridement.  At time of discharge they wanted him to go to a rehab facility but patient declined.  Since going home, he has not been able to move around.  Reports sliding out of his bed multiple times with poor pain control.  He states coming back in here as he needs to go to a rehab facility at this time.  Denies any new or worsening pain symptoms.  No head injury.     Physical Exam   Triage Vital Signs: ED Triage Vitals  Encounter Vitals Group     BP 09/16/23 2234 (!) 153/73     Girls Systolic BP Percentile --      Girls Diastolic BP Percentile --      Boys Systolic BP Percentile --      Boys Diastolic BP Percentile --      Pulse Rate 09/16/23 2234 83     Resp 09/16/23 2234 17     Temp 09/16/23 2234 98.5 F (36.9 C)     Temp Source 09/16/23 2234 Oral     SpO2 09/16/23 2234 100 %     Weight 09/16/23 2235 (!) 328 lb (148.8 kg)     Height 09/16/23 2235 5' 11 (1.803 m)     Head Circumference --      Peak Flow --      Pain Score 09/16/23 2235 9     Pain Loc --      Pain Education --      Exclude from Growth Chart --     Most recent vital signs: Vitals:   09/16/23 2234  BP: (!) 153/73  Pulse: 83  Resp: 17  Temp: 98.5 F (36.9 C)  SpO2: 100%   I have reviewed the vital signs. General:  Awake, alert, no acute distress. Head:  Normocephalic, Atraumatic. EENT:  PERRL, EOMI, Oral mucosa pink and moist, Neck is supple. Cardiovascular: Regular rate, 2+ distal  pulses. Respiratory:  Normal respiratory effort, symmetrical expansion, no distress.   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  Interacting appropriately.   Skin: Recent surgical site wound to left AKA is well-healing without signs of infection. Psych: Appropriate affect.    ED Results / Procedures / Treatments   Labs (all labs ordered are listed, but only abnormal results are displayed) Labs Reviewed  CBC WITH DIFFERENTIAL/PLATELET - Abnormal; Notable for the following components:      Result Value   RBC 3.37 (*)    Hemoglobin 7.5 (*)    HCT 24.9 (*)    MCV 73.9 (*)    MCH 22.3 (*)    RDW 20.3 (*)    nRBC 0.3 (*)    All other components within normal limits  BASIC METABOLIC PANEL WITH GFR - Abnormal; Notable for the following components:   Glucose, Bld 151 (*)    Calcium  8.2 (*)    All other components within normal limits  CBG  MONITORING, ED - Abnormal; Notable for the following components:   Glucose-Capillary 131 (*)    All other components within normal limits     EKG    RADIOLOGY    PROCEDURES:  Critical Care performed: No  Procedures   MEDICATIONS ORDERED IN ED: Medications  oxyCODONE  (Oxy IR/ROXICODONE ) immediate release tablet 10 mg (10 mg Oral Given 09/16/23 2345)     IMPRESSION / MDM / ASSESSMENT AND PLAN / ED COURSE  I reviewed the triage vital signs and the nursing notes.                              Differential diagnosis includes, but is not limited to, failure to thrive, poor pain management  Patient's presentation is most consistent with acute complicated illness / injury requiring diagnostic workup.  Patient is a 58 year old male presenting today after recent admission for AKA with ongoing pain and inability to take care of himself.  Notes suggest that they had offered rehab admission but patient declined and went home.  He has not able to take care of  himself and multiple times fell out of his bed in his chair.  Denies any specific injury specifically no head injury.  Offered x-rays of his lower extremity but patient declined.  His wound site is well-healing with no signs of infection.  Given oxycodone  for pain management.  Laboratory workup reassuring.  Will place patient under boarder status and have social worker see him in the morning as no indication for admission and will start working on rehab placement.  The patient is on the cardiac monitor to evaluate for evidence of arrhythmia and/or significant heart rate changes. Clinical Course as of 09/17/23 0134  Sat Sep 17, 2023  0112 CBC with Differential(!) Largely reassuring. Improved from 4 days ago [DW]    Clinical Course User Index [DW] Malvina Alm DASEN, MD     FINAL CLINICAL IMPRESSION(S) / ED DIAGNOSES   Final diagnoses:  Fall, initial encounter  Failure to thrive in adult     Rx / DC Orders   ED Discharge Orders     None        Note:  This document was prepared using Dragon voice recognition software and may include unintentional dictation errors.   Malvina Alm DASEN, MD 09/17/23 [phone]

## 2023-09-16 NOTE — ED Triage Notes (Signed)
 Pt BIB [facility] from home for a fall from approx 3 feet. Pt was in hospital bed following AKA of l. Leg. Pt states I was trying to pee and slipped off the mattress. Pt also states I should've gone to rehab. I want to go now. Pt c/o 9/10 pain to l. Leg.

## 2023-09-17 LAB — CBC WITH DIFFERENTIAL/PLATELET
Abs Immature Granulocytes: 0.06 K/uL (ref 0.00–0.07)
Basophils Absolute: 0 K/uL (ref 0.0–0.1)
Basophils Relative: 0 %
Eosinophils Absolute: 0.1 K/uL (ref 0.0–0.5)
Eosinophils Relative: 1 %
HCT: 24.9 % — ABNORMAL LOW (ref 39.0–52.0)
Hemoglobin: 7.5 g/dL — ABNORMAL LOW (ref 13.0–17.0)
Immature Granulocytes: 1 %
Lymphocytes Relative: 20 %
Lymphs Abs: 1.6 K/uL (ref 0.7–4.0)
MCH: 22.3 pg — ABNORMAL LOW (ref 26.0–34.0)
MCHC: 30.1 g/dL (ref 30.0–36.0)
MCV: 73.9 fL — ABNORMAL LOW (ref 80.0–100.0)
Monocytes Absolute: 0.5 K/uL (ref 0.1–1.0)
Monocytes Relative: 6 %
Neutro Abs: 5.6 K/uL (ref 1.7–7.7)
Neutrophils Relative %: 72 %
Platelets: 256 K/uL (ref 150–400)
RBC: 3.37 MIL/uL — ABNORMAL LOW (ref 4.22–5.81)
RDW: 20.3 % — ABNORMAL HIGH (ref 11.5–15.5)
WBC: 7.9 K/uL (ref 4.0–10.5)
nRBC: 0.3 % — ABNORMAL HIGH (ref 0.0–0.2)

## 2023-09-17 LAB — BASIC METABOLIC PANEL WITH GFR
Anion gap: 12 (ref 5–15)
BUN: 11 mg/dL (ref 6–20)
CO2: 24 mmol/L (ref 22–32)
Calcium: 8.2 mg/dL — ABNORMAL LOW (ref 8.9–10.3)
Chloride: 102 mmol/L (ref 98–111)
Creatinine, Ser: 0.64 mg/dL (ref 0.61–1.24)
GFR, Estimated: >60 mL/min
Glucose, Bld: 151 mg/dL — ABNORMAL HIGH (ref 70–99)
Potassium: 4.1 mmol/L (ref 3.5–5.1)
Sodium: 138 mmol/L (ref 135–145)

## 2023-09-17 LAB — CBG MONITORING, ED
Glucose-Capillary: 250 mg/dL — ABNORMAL HIGH (ref 70–99)
Glucose-Capillary: 294 mg/dL — ABNORMAL HIGH (ref 70–99)
Glucose-Capillary: 264 mg/dL — ABNORMAL HIGH (ref 70–99)

## 2023-09-17 MED ORDER — POLYETHYLENE GLYCOL 3350 17 G PO PACK
17.0000 g | PACK | Freq: Every day | ORAL | Status: DC | PRN
  Administered 2023-09-20 – 2023-09-23 (×3): 17 g via ORAL
  Filled 2023-09-17 (×3): qty 1

## 2023-09-17 MED ORDER — SENNA 8.6 MG PO TABS
1.0000 | ORAL_TABLET | Freq: Every day | ORAL | Status: DC
  Administered 2023-09-17 – 2023-09-23 (×7): 8.6 mg via ORAL
  Filled 2023-09-17 (×7): qty 1

## 2023-09-17 MED ORDER — PREGABALIN 75 MG PO CAPS
150.0000 mg | ORAL_CAPSULE | Freq: Three times a day (TID) | ORAL | Status: DC
  Administered 2023-09-17 – 2023-09-23 (×20): 150 mg via ORAL
  Filled 2023-09-17 (×20): qty 2

## 2023-09-17 MED ORDER — ATORVASTATIN CALCIUM 20 MG PO TABS
40.0000 mg | ORAL_TABLET | Freq: Every day | ORAL | Status: DC
  Administered 2023-09-17 – 2023-09-23 (×7): 40 mg via ORAL
  Filled 2023-09-17 (×7): qty 2

## 2023-09-17 MED ORDER — BACLOFEN 10 MG PO TABS
10.0000 mg | ORAL_TABLET | Freq: Four times a day (QID) | ORAL | Status: DC | PRN
  Administered 2023-09-17 – 2023-09-22 (×10): 10 mg via ORAL
  Filled 2023-09-17 (×10): qty 1

## 2023-09-17 MED ORDER — LORATADINE 10 MG PO TABS
10.0000 mg | ORAL_TABLET | Freq: Every day | ORAL | Status: DC
  Administered 2023-09-17 – 2023-09-23 (×7): 10 mg via ORAL
  Filled 2023-09-17 (×7): qty 1

## 2023-09-17 MED ORDER — TAMSULOSIN HCL 0.4 MG PO CAPS
0.8000 mg | ORAL_CAPSULE | Freq: Every day | ORAL | Status: DC
  Administered 2023-09-17 – 2023-09-23 (×7): 0.8 mg via ORAL
  Filled 2023-09-17 (×7): qty 2

## 2023-09-17 MED ORDER — MELATONIN 5 MG PO TABS
5.0000 mg | ORAL_TABLET | Freq: Every day | ORAL | Status: DC
  Administered 2023-09-17 – 2023-09-22 (×6): 5 mg via ORAL
  Filled 2023-09-17 (×6): qty 1

## 2023-09-17 MED ORDER — DULOXETINE HCL 60 MG PO CPEP
90.0000 mg | ORAL_CAPSULE | Freq: Every day | ORAL | Status: DC
  Administered 2023-09-17 – 2023-09-23 (×7): 90 mg via ORAL
  Filled 2023-09-17 (×7): qty 1

## 2023-09-17 MED ORDER — METOPROLOL SUCCINATE ER 50 MG PO TB24
100.0000 mg | ORAL_TABLET | Freq: Every day | ORAL | Status: DC
  Administered 2023-09-17 – 2023-09-23 (×7): 100 mg via ORAL
  Filled 2023-09-17 (×7): qty 2

## 2023-09-17 MED ORDER — POTASSIUM CHLORIDE CRYS ER 20 MEQ PO TBCR
10.0000 meq | EXTENDED_RELEASE_TABLET | Freq: Two times a day (BID) | ORAL | Status: DC
  Administered 2023-09-17 – 2023-09-23 (×14): 10 meq via ORAL
  Filled 2023-09-17 (×14): qty 1

## 2023-09-17 MED ORDER — FINASTERIDE 5 MG PO TABS
5.0000 mg | ORAL_TABLET | Freq: Every day | ORAL | Status: DC
  Administered 2023-09-17 – 2023-09-23 (×7): 5 mg via ORAL
  Filled 2023-09-17 (×7): qty 1

## 2023-09-17 MED ORDER — INSULIN GLARGINE-YFGN 100 UNIT/ML ~~LOC~~ SOLN
20.0000 [IU] | Freq: Every day | SUBCUTANEOUS | Status: DC
  Administered 2023-09-17 – 2023-09-22 (×6): 20 [IU] via SUBCUTANEOUS
  Filled 2023-09-17 (×7): qty 0.2

## 2023-09-17 MED ORDER — OXYCODONE HCL 5 MG PO TABS
10.0000 mg | ORAL_TABLET | Freq: Once | ORAL | Status: AC
  Administered 2023-09-17: 10 mg via ORAL
  Filled 2023-09-17: qty 2

## 2023-09-17 MED ORDER — INSULIN ASPART 100 UNIT/ML IJ SOLN
10.0000 [IU] | Freq: Three times a day (TID) | INTRAMUSCULAR | Status: DC
  Administered 2023-09-17 – 2023-09-23 (×20): 10 [IU] via SUBCUTANEOUS
  Filled 2023-09-17 (×21): qty 1

## 2023-09-17 MED ORDER — OXYCODONE HCL 5 MG PO TABS
10.0000 mg | ORAL_TABLET | Freq: Four times a day (QID) | ORAL | Status: DC | PRN
  Administered 2023-09-17 – 2023-09-23 (×18): 10 mg via ORAL
  Filled 2023-09-17 (×18): qty 2

## 2023-09-17 MED ORDER — BUPROPION HCL ER (XL) 150 MG PO TB24
150.0000 mg | ORAL_TABLET | Freq: Every day | ORAL | Status: DC
  Administered 2023-09-17 – 2023-09-23 (×7): 150 mg via ORAL
  Filled 2023-09-17 (×7): qty 1

## 2023-09-17 MED ORDER — ASPIRIN 81 MG PO TBEC
81.0000 mg | DELAYED_RELEASE_TABLET | Freq: Every day | ORAL | Status: DC
  Administered 2023-09-17 – 2023-09-23 (×7): 81 mg via ORAL
  Filled 2023-09-17 (×7): qty 1

## 2023-09-17 MED ORDER — FUROSEMIDE 40 MG PO TABS
80.0000 mg | ORAL_TABLET | Freq: Two times a day (BID) | ORAL | Status: DC
  Administered 2023-09-17 – 2023-09-23 (×14): 80 mg via ORAL
  Filled 2023-09-17 (×14): qty 2

## 2023-09-17 MED ORDER — ACETAMINOPHEN 325 MG PO TABS
650.0000 mg | ORAL_TABLET | Freq: Four times a day (QID) | ORAL | Status: DC | PRN
  Administered 2023-09-20: 650 mg via ORAL
  Filled 2023-09-17: qty 2

## 2023-09-17 MED ORDER — DIPHENHYDRAMINE HCL 25 MG PO CAPS: 25.0000 mg | ORAL_CAPSULE | Freq: Four times a day (QID) | ORAL | Status: DC | PRN

## 2023-09-17 NOTE — ED Notes (Signed)
 Pt repositioned by this RN.  Pt c/o lower back pain. Advised pt not take meds from home and that the doctor could order them for him. Pt verbalized understanding.  NAD

## 2023-09-17 NOTE — ED Provider Notes (Signed)
 Emergency Medicine Observation Re-evaluation Note  Russell [name] is a 58 y.o. male, seen on rounds today.  Pt initially presented to the ED for complaints of Fall Currently, the patient is awaiting skilled nursing facility placement for transitions of care  Physical Exam  BP (!) 153/73 (BP Location: Right Arm)   Pulse 83   Temp 98.5 F (36.9 C) (Oral)   Resp 17   Ht 5' 11 (1.803 m)   Wt (!) 148.8 kg   SpO2 100%   BMI 45.75 kg/m  Physical Exam General: Well-appearing in no distress working on a computer Cardiac: Appears well-perfused Lungs: No respiratory distress Psych: Denies SI HI or AVHS Has suprapubic catheter all in place, left AKA with sutures and staples in place  ED Course / MDM  EKG:   I have reviewed the labs performed to date as well as medications administered while in observation.  Recent changes in the last 24 hours include TOC will come back tomorrow. Denies any sacral discomfort   Plan  Current plan is for SNF placement-- If patient has a prolonged ED boarding course, may need to discuss with vascular surgery plan for sutures/staples.     Nicholaus Rolland FORBES, MD 09/17/23 [phone]

## 2023-09-17 NOTE — ED Notes (Signed)
 This tech obtained labs from pt.   Pt also asked about food, this tech asked pt's nurse and got and OK to give pt some food.   Tech then provided pt with sandwich tray and beverage.  No other needs at the moment.

## 2023-09-17 NOTE — TOC Progression Note (Signed)
 Transition of Care [facility]) - Progression Note    Patient Details  Name: Russell [name] MRN: [id number] Date of Birth: 09-16-1965  [facility] CM/SW Contact  [place], [place] Phone Number: 09/17/2023, 1:39 PM  Clinical Narrative:     CSW met with patient at bedside to complete readmission screening. Patient advised that he needed to go to a SNF for rehab. CSW and patient discussed choice. Patient had no preferences and advised CSW to choose the closest facility to the hospital.   FL2 completed and signed.        Expected Discharge Plan and Services                                               Social Determinants of Health (SDOH) Interventions SDOH Screenings   Food Insecurity: Unknown (09/07/2023)  Housing: Low Risk  (09/07/2023)  Transportation Needs: No Transportation Needs (09/07/2023)  Utilities: Not At Risk (09/07/2023)  Financial Resource Strain: Low Risk  (04/04/2023)   Received from [facility]  Tobacco Use: Low Risk  (09/07/2023)    Readmission Risk Interventions    03/03/2023   12:27 PM 10/25/2022    3:55 PM  Readmission Risk Prevention Plan  Transportation Screening Complete Complete  PCP or Specialist Appt within 3-5 Days  Complete  HRI or Home Care Consult  Complete  Social Work Consult for Recovery Care Planning/Counseling  Complete  Palliative Care Screening  Not Applicable  Medication Review Oceanographer) Complete Complete  PCP or Specialist appointment within 3-5 days of discharge Complete   HRI or Home Care Consult Complete   Palliative Care Screening Not Applicable   Skilled Nursing Facility Not Applicable

## 2023-09-17 NOTE — ED Notes (Signed)
 Sprite zero and sandwich provided

## 2023-09-17 NOTE — [place] FL2 (Signed)
 [place]  MEDICAID [id number] LEVEL OF CARE FORM     IDENTIFICATION  Patient Name: Russell [name] Birthdate: [birth year] Sex: male Admission Date (Current Location): 09/16/2023  [facility] and [place] Number:  Chiropodist and Address:  [facility], [address] [place], [place] [address]      Provider Number: [id number]  Attending Physician Name and Address:  No att. 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) 09/09/2023   Acute osteomyelitis of metatarsal bone of left foot (HCC) 09/06/2023   OSA (obstructive sleep apnea) 09/06/2023   Chronic respiratory failure with hypoxia (HCC) 09/06/2023   Sepsis due to cellulitis (HCC) 02/27/2023   PAD (peripheral artery disease) (HCC) 11/10/2022   Generalized weakness 11/09/2022   Ambulatory dysfunction 11/09/2022   Residual hemorrhoidal skin tags 11/06/2022   Acute gastritis 11/06/2022   Major depressive disorder, recurrent episode, moderate (HCC) 10/28/2022   Dyslipidemia 10/24/2022   Anxiety and depression 10/24/2022   Essential hypertension 10/24/2022   Type 2 diabetes mellitus with peripheral neuropathy (HCC) 10/24/2022   Acute gastric ulcer with hemorrhage 10/24/2022   Acute urinary retention 10/17/2022   Acute respiratory failure with hypoxia (HCC) 10/15/2022   Multiple gastric ulcers 10/14/2022   Hyperosmolar hyperglycemic state (HHS) (HCC) 10/13/2022   HLD (hyperlipidemia) 10/13/2022   Acute on chronic diastolic CHF (congestive heart failure) (HCC) 10/13/2022   Depression with anxiety 10/13/2022   Hyperkalemia 10/13/2022   Elevated  lactic acid level 10/13/2022   Rectal bleeding 10/13/2022   Acute blood loss anemia 10/13/2022   Wound infection_ left lower leg 10/13/2022   Upper GI bleed 10/13/2022   Hypovolemic shock (HCC) 10/13/2022   Epigastric abdominal pain 10/13/2022   Sciatica of right side 10/13/2022   AKI (acute kidney injury) (HCC) 10/13/2022   Hypotension 11/21/2020   Atrial flutter with rapid ventricular response (HCC) 11/21/2020   Chronic kidney disease, stage 4, severely decreased GFR (HCC) 06/14/2018   Chronic venous insufficiency 06/14/2018   Cellulitis of left lower extremity 02/01/2018   Cellulitis 05/18/2017   Hyperlipidemia, mixed 01/10/2017   Cellulitis and abscess of left leg 08/08/2016   Diabetes with ulcer of leg (HCC) 08/08/2016   Hyperglycemia 08/08/2016   Morbid obesity (HCC) 08/08/2016   Cellulitis and abscess of leg 08/08/2016   Adjustment disorder with depressed mood 11/21/2014   Other social stressor 11/21/2014   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   Diabetes (HCC) 11/21/2014    Orientation RESPIRATION BLADDER Height & Weight     Self, Time, Situation, Place  Normal Incontinent (Urethral Catheter) Weight: (!) 328 lb (148.8 kg) Height:  5' 11 (180.3 cm)  BEHAVIORAL SYMPTOMS/MOOD NEUROLOGICAL BOWEL NUTRITION STATUS     (Poor attention span at times) Continent Diet  AMBULATORY STATUS COMMUNICATION OF NEEDS Skin   Extensive Assist Verbally Surgical wounds (Decubitus Ulcer of Sacrum; Left foot ulcer; Left hand osteomyelitis; transradial amputation)                       Personal Care Assistance Level of Assistance  Bathing, Feeding, Dressing Bathing Assistance: Limited assistance Feeding assistance: Limited assistance Dressing Assistance: Limited assistance  Functional Limitations Info  Sight, Hearing, Speech Sight Info: Adequate Hearing Info: Adequate Speech Info: Adequate    SPECIAL CARE FACTORS FREQUENCY  PT (By licensed PT), OT (By licensed OT)      PT Frequency: Min 2x weekly OT Frequency: Min 2x weekly            Contractures Contractures Info: Not present    Additional Factors Info  Code Status, Allergies Code Status Info: FULL Allergies Info: Fluoxentine, Shellfish Allery, Sulfa Antibiotics, Vancomycin , Metformin           Current Medications (09/17/2023):  This is the current hospital active medication list Current Facility-Administered Medications  Medication Dose Route Frequency Provider Last Rate Last Admin   acetaminophen  (TYLENOL ) tablet 650 mg  650 mg Oral Q6H PRN Malvina Alm DASEN, MD       aspirin  EC tablet 81 mg  81 mg Oral Daily Malvina Alm DASEN, MD   81 mg at 09/17/23 1100   atorvastatin  (LIPITOR) tablet 40 mg  40 mg Oral Daily Malvina Alm DASEN, MD   40 mg at 09/17/23 1101   baclofen  (LIORESAL ) tablet 10 mg  10 mg Oral Q6H PRN Malvina Alm DASEN, MD   10 mg at 09/17/23 1113   buPROPion  (WELLBUTRIN  XL) 24 hr tablet 150 mg  150 mg Oral Daily Malvina Alm DASEN, MD   150 mg at 09/17/23 1100   diphenhydrAMINE  (BENADRYL ) capsule 25 mg  25 mg Oral Q6H PRN Malvina Alm DASEN, MD       DULoxetine  (CYMBALTA ) DR capsule 90 mg  90 mg Oral Daily Malvina Alm DASEN, MD   90 mg at 09/17/23 1101   finasteride  (PROSCAR ) tablet 5 mg  5 mg Oral Daily Malvina Alm DASEN, MD   5 mg at 09/17/23 1103   furosemide  (LASIX ) tablet 80 mg  80 mg Oral BID Malvina Alm DASEN, MD   80 mg at 09/17/23 1101   insulin  aspart (novoLOG ) injection 10 Units  10 Units Subcutaneous TID WC Malvina Alm DASEN, MD   10 Units at 09/17/23 1116   insulin  glargine-yfgn (SEMGLEE ) injection 20 Units  20 Units Subcutaneous QHS Malvina Alm DASEN, MD       loratadine  (CLARITIN ) tablet 10 mg  10 mg Oral Daily Malvina Alm DASEN, MD   10 mg at 09/17/23 1100   melatonin tablet 5 mg  5 mg Oral QHS Malvina Alm DASEN, MD       metoprolol  succinate (TOPROL -XL) 24 hr tablet 100 mg  100 mg Oral Daily Malvina Alm DASEN, MD   100 mg at 09/17/23 1102   oxyCODONE  (Oxy IR/ROXICODONE ) immediate release tablet 10 mg   10 mg Oral Q6H PRN Malvina Alm DASEN, MD   10 mg at 09/17/23 0834   polyethylene glycol (MIRALAX  / GLYCOLAX ) packet 17 g  17 g Oral Daily PRN Malvina Alm DASEN, MD       potassium chloride  SA (KLOR-CON  M) CR tablet 10 mEq  10 mEq Oral BID Malvina Alm DASEN, MD   10 mEq at 09/17/23 1100   pregabalin  (LYRICA ) capsule 150 mg  150 mg Oral TID Malvina Alm DASEN, MD   150 mg at 09/17/23 1102   senna (SENOKOT) tablet 8.6 mg  1 tablet Oral Daily Paduchowski, Kevin, MD   8.6 mg at 09/17/23 1100   tamsulosin  (FLOMAX ) capsule 0.8 mg  0.8 mg Oral Daily Malvina Alm DASEN, MD   0.8 mg at 09/17/23 1102   Current Outpatient Medications  Medication Sig Dispense Refill  acetaminophen  (TYLENOL ) 325 MG tablet Take 2 tablets (650 mg total) by mouth every 6 (six) hours as needed for mild pain (pain score 1-3) (or Fever >/= 101).     amoxicillin -clavulanate (AUGMENTIN ) 875-125 MG tablet Take 1 tablet by mouth 2 (two) times daily for 3 days. 6 tablet 0   aspirin  EC 81 MG tablet Take 81 mg by mouth.  Take 1 tablet (81 mg total) by mouth once daily     atorvastatin  (LIPITOR) 40 MG tablet Take 40 mg by mouth daily.      baclofen  (LIORESAL ) 10 MG tablet Take by mouth.  One tablet, oral, Every 6 Hours - PRN     cetirizine (ZYRTEC) 10 MG tablet Take 10 mg by mouth daily as needed for allergies.     diclofenac Sodium (VOLTAREN) 1 % GEL Apply 2 g topically daily as needed (for pain).     diphenhydrAMINE  (BENADRYL ) 25 mg capsule Take 1 capsule (25 mg total) by mouth every 6 (six) hours as needed for allergies or itching (30 minutes prior to starting Vancomycin ).     DULoxetine  (CYMBALTA ) 30 MG capsule Take 90 mg by mouth daily.     furosemide  (LASIX ) 80 MG tablet Take 80 mg by mouth 2 (two) times daily.     insulin  regular (NOVOLIN R) 100 units/mL injection Inject 80 Units into the skin 3 (three) times daily before meals.     loperamide (IMODIUM A-D) 2 MG tablet Take 2 mg by mouth as needed for diarrhea or loose stools.     melatonin 5 MG  TABS Take 1 tablet (5 mg total) by mouth at bedtime.     metoprolol  succinate (TOPROL -XL) 100 MG 24 hr tablet Take 100 mg by mouth daily.     Oxycodone  HCl 10 MG TABS Take by mouth.     oxymetazoline  (AFRIN) 0.05 % nasal spray Place 1 spray into both nostrils 2 (two) times daily as needed for congestion.     polyethylene glycol (MIRALAX  / GLYCOLAX ) 17 g packet Take 17 g by mouth daily as needed.     potassium chloride  (MICRO-K ) 10 MEQ CR capsule Take by mouth 2 (two) times daily. :1 Tablet(s) By Mouth Twice Daily     pregabalin  (LYRICA ) 150 MG capsule Take 150 mg by mouth 3 (three) times daily.     senna (SENOKOT) 8.6 MG TABS tablet Take 2 tablets by mouth daily.     tamsulosin  (FLOMAX ) 0.4 MG CAPS capsule Take 0.8 mg by mouth daily.     buPROPion  (WELLBUTRIN  XL) 150 MG 24 hr tablet Take 1 tablet (150 mg total) by mouth daily. 30 tablet 0   finasteride  (PROSCAR ) 5 MG tablet Take 5 mg by mouth daily. (Patient not taking: Reported on 09/17/2023)     gabapentin  (NEURONTIN ) 600 MG tablet Take 600 mg by mouth 3 (three) times daily.     HUMULIN  R 100 UNIT/ML injection Inject 0.1 mLs (10 Units total) into the skin 3 (three) times daily with meals. (Patient not taking: Reported on 09/17/2023)     insulin  glargine-yfgn (SEMGLEE ) 100 UNIT/ML injection Inject 0.2 mLs (20 Units total) into the skin at bedtime. (Patient not taking: Reported on 09/17/2023) 10 mL 11     Discharge Medications: Please see discharge summary for a list of discharge medications.  Relevant Imaging Results:  Relevant Lab Results:   Additional Information [phone]  [place], [place]

## 2023-09-18 LAB — CBG MONITORING, ED
Glucose-Capillary: 210 mg/dL — ABNORMAL HIGH (ref 70–99)
Glucose-Capillary: 271 mg/dL — ABNORMAL HIGH (ref 70–99)
Glucose-Capillary: 272 mg/dL — ABNORMAL HIGH (ref 70–99)
Glucose-Capillary: 211 mg/dL — ABNORMAL HIGH (ref 70–99)

## 2023-09-18 NOTE — ED Notes (Signed)
 Patient moved to a hospital bed for comfort. Given pain medication for leg pain.

## 2023-09-18 NOTE — Progress Notes (Signed)
 Physical Therapy Evaluation Patient Details Name: Russell [name] MRN: [id number] DOB: 02-Oct-1965 Today's Date: 09/18/2023  History of Present Illness  Pt is a 58 y/o male admitted secondary to fall off of his bed at home. Pt requesting SNF placement. PMH including but not limited to s/p L AKA on 09/07/23, left hand osteomyelitis S/P transradial amputation in 2/25, right BKA, decubitus ulcer of sacrum and left foot ulcer, PAD, history of GI bleed, history of A -flutter, HTN, morbid obesity, OSA not on CPAP, depression, uncontrolled type II DM with neuropathy, chronic HFpEF, chronic back pain   Clinical Impression  Pt presented supine in bed with HOB elevated, awake and willing to participate in therapy session. Prior to admission, pt had been back in his new handicap apartment after very recent admission to hospital [facility]) following a prolonged admission at another hospital. Pt lives alone. He reported that he and his wife are separated but that she would be willing to assist if needed. Pt requires assistance for ADLs/IADLs and functional mobility with various DME including a hospital bed with trapeze bar; however, pt reporting that the bed is very small and the frame is not sturdy. Pt feels he needs more help than he is able to get at home and would like to go to SNF for rehab. Ultimately his goal is to obtain bilateral prostheses and eventually stand and walk again. At the time of evaluation, pt able to complete bed mobility with min A and laterally scoot at EOB on stretcher in ED with CGA for safety. Pt would continue to benefit from skilled physical therapy services at this time while admitted and after d/c to address the below listed limitations in order to improve overall safety and independence with functional mobility.       If plan is discharge home, recommend the following: Two people to help with walking and/or transfers;Two people to help with bathing/dressing/bathroom;Assistance with  cooking/housework;Assist for transportation   Can travel by private vehicle   No    Equipment Recommendations Other (comment) (defer to next venue of care)  Recommendations for Other Services       Functional Status Assessment Patient has had a recent decline in their functional status and demonstrates the ability to make significant improvements in function in a reasonable and predictable amount of time.     Precautions / Restrictions Precautions Precautions: Fall Precaution/Restrictions Comments: chronic R BKA, acute L AKA, L transradial amputation, foley Restrictions Other Position/Activity Restrictions: No WB'ing in MD orders but maintained NWB'ing through L residual limb      Mobility  Bed Mobility Overal bed mobility: Needs Assistance Bed Mobility: Supine to Sit, Sit to Sidelying, Rolling Rolling: Contact guard assist, Used rails   Supine to sit: Min assist, +2 for safety/equipment, HOB elevated   Sit to sidelying: Contact guard assist, +2 for safety/equipment General bed mobility comments: pt on stretcher in ED; able to roll to reposition himself with supervision; min A needed for trunk elevation to achieve an upright sitting position at EOB; CGA to return to sidelying and supine    Transfers Overall transfer level: Needs assistance Equipment used: None               General transfer comment: pt able to laterally scoot EOB towards his R side several scoots with CGA for safety    Ambulation/Gait                  Stairs  Wheelchair Mobility     Tilt Bed    Modified Rankin (Stroke Patients Only)       Balance Overall balance assessment: Needs assistance Sitting-balance support: Feet unsupported, No upper extremity supported Sitting balance-Leahy Scale: Fair Sitting balance - Comments: pt able to sit edge of stretcher with no UE supports and no LOB statically and with some small weight shifts                                      Pertinent Vitals/Pain Pain Assessment Pain Assessment: Faces Faces Pain Scale: Hurts little more Pain Location: L residual limb Pain Descriptors / Indicators: Guarding, Sore, Throbbing Pain Intervention(s): Monitored during session, Repositioned    Home Living Family/patient expects to be discharged to:: Skilled nursing facility                   Additional Comments: pt would like to d/c to SNF    Prior Function Prior Level of Function : Needs assist             Mobility Comments: pt states he sleeps in recliner, uses power wheel chair. recent prolonged hospitalization at [facility], pt states he was walking very short distances with RLE prosthesis (does not fit appropriately, and has not since Fall 2024) and L PFRW with PT. ADLs Comments: pt states he was MOD I with ADLs (question accuracy of this as pt states he was d/c from [name] to hotel). he was only home at his new apt for few days before returning to [facility].     Extremity/Trunk Assessment   Upper Extremity Assessment Upper Extremity Assessment: Defer to OT evaluation    Lower Extremity Assessment Lower Extremity Assessment: RLE deficits/detail;LLE deficits/detail RLE Deficits / Details: chronic R BKA RLE Sensation: decreased light touch;history of peripheral neuropathy LLE Deficits / Details: L AKA, surgical site clean and sutures/staples intact; very large residual limb LLE Sensation: history of peripheral neuropathy;decreased light touch       Communication   Communication Communication: No apparent difficulties    Cognition Arousal: Alert Behavior During Therapy: WFL for tasks assessed/performed   PT - Cognitive impairments: No apparent impairments                       PT - Cognition Comments: cognition not formally assessed but [facility] for general conversation Following commands: Intact       Cueing Cueing Techniques: Verbal cues, Gestural cues     General Comments       Exercises     Assessment/Plan    PT Assessment Patient needs continued PT services  PT Problem List Decreased strength;Decreased range of motion;Decreased activity tolerance;Decreased balance;Decreased mobility;Decreased coordination;Decreased knowledge of use of DME;Decreased safety awareness;Decreased knowledge of precautions;Pain       PT Treatment Interventions DME instruction;Functional mobility training;Therapeutic activities;Therapeutic exercise;Balance training;Neuromuscular re-education;Patient/family education    PT Goals (Current goals can be found in the Care Plan section)  Acute Rehab PT Goals Patient Stated Goal: go to rehab PT Goal Formulation: With patient Time For Goal Achievement: 10/02/23 Potential to Achieve Goals: Good    Frequency Min 2X/week     Co-evaluation PT/OT/SLP Co-Evaluation/Treatment: Yes Reason for Co-Treatment: For patient/therapist safety;To address functional/ADL transfers PT goals addressed during session: Mobility/safety with mobility;Balance;Strengthening/ROM         AM-PAC PT 6 Clicks Mobility  Outcome Measure Help needed turning from your back  to your side while in a flat bed without using bedrails?: A Little Help needed moving from lying on your back to sitting on the side of a flat bed without using bedrails?: A Little Help needed moving to and from a bed to a chair (including a wheelchair)?: Total Help needed standing up from a chair using your arms (e.g., wheelchair or bedside chair)?: Total Help needed to walk in hospital room?: Total Help needed climbing 3-5 steps with a railing? : Total 6 Click Score: 10    End of Session   Activity Tolerance: Patient tolerated treatment well Patient left: with call bell/phone within reach;Other (comment) (on stretcher in ED) Nurse Communication: Mobility status PT Visit Diagnosis: Other abnormalities of gait and mobility (R26.89)    Time: 9245-9179 PT Time Calculation (min) (ACUTE  ONLY): 26 min   Charges:   PT Evaluation $PT Eval Moderate Complexity: 1 Mod   PT General Charges $$ ACUTE PT VISIT: 1 Visit         Russell [name], DPT  Acute Rehabilitation Services Office [phone]   Russell HERO [place] Diana 09/18/2023, 10:14 AM

## 2023-09-18 NOTE — ED Notes (Signed)
 Introduced myself to patient. Patient is resting in bed listening to music. Reports pain in the left leg with muscle spasm. Advised he could have his pain medication every 6 hours.

## 2023-09-18 NOTE — Progress Notes (Signed)
 Occupational Therapy Evaluation Patient Details Name: Russell [name] MRN: [id number] DOB: 1965/11/13 Today's Date: 09/18/2023   History of Present Illness   Pt is a 58 y/o male admitted secondary to fall off of his bed at home. Pt requesting SNF placement. 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 PTA, pt reports being back in his new handicap apartment after very recent admission to hospital [facility]) following a prolonged admission at another hospital and a lengthy rehab stay. Pt lives alone. He reported that he and his wife are separated, but that she would be willing to assist if needed. Pt requires assistance for ADLs/IADLs and functional mobility with various DME including a hospital bed with trapeze bar; however, pt reporting that the bed is very small and the frame is not sturdy. Pt feels he needs more help than he is able to get at home and would like to go to SNF for rehab. He is currently Min A to reach EOB for trunkal elevation, able to lateral scoot to [facility] and weight shift for brief removal with CGA. He demo self feeding with MOD I at bed level. UB dressing with Min/Mod A and anticipate Max A for LB ADL management. Pt would benefit from skilled OT services to address noted impairments and functional limitations to maximize safety and independence while minimizing falls risk and caregiver burden. Do anticipate the need for follow up OT services upon acute hospital DC.      If plan is discharge home, recommend the following:   Two people to help with walking and/or transfers;A lot of help with  bathing/dressing/bathroom;Assistance with cooking/housework;Direct supervision/assist for financial management;Supervision due to cognitive status;Direct supervision/assist for medications management;Help with stairs or ramp for entrance;Assist for transportation     Functional Status Assessment   Patient has had a recent decline in their functional status and demonstrates the ability to make significant improvements in function in a reasonable and predictable amount of time.     Equipment Recommendations   [employer];Hospital bed;BSC/3in1;Other (comment) (defer)     Recommendations for Other Services         Precautions/Restrictions   Precautions Precautions: Fall Precaution/Restrictions Comments: chronic R BKA, acute L AKA, L transradial amputation, foley Restrictions Other Position/Activity Restrictions: No WB'ing in MD orders but maintained NWB'ing through L residual limb     Mobility Bed Mobility Overal bed mobility: Needs Assistance Bed Mobility: Supine to Sit, Sit to Sidelying, Rolling Rolling: Contact guard assist, Used rails Sidelying to sit: Min assist, HOB elevated     Sit to sidelying: Contact guard assist, +2 for safety/equipment General bed mobility comments: bed mobility performed in ED stretcher, able to roll in bed and reposition with supervision, Min A for trunkal elevation to safely reach EOB; able to lateral scoot with CGA x2    Transfers                   General transfer comment: pt able to lateral scoot at EOB to R side via small scoots with CGA provided for safety from high ED gurney      Balance Overall balance assessment: Needs assistance  Sitting-balance support: Feet unsupported, No upper extremity supported Sitting balance-Leahy Scale: Fair Sitting balance - Comments: CGA       Standing balance comment: NA, bil amputations and did not have prosthetic                           ADL either performed or assessed  with clinical judgement   ADL Overall ADL's : Needs assistance/impaired                 Upper Body Dressing : Minimal assistance;Sitting;Moderate assistance Upper Body Dressing Details (indicate cue type and reason): to don gown at bed level                   General ADL Comments: anticipate Max A for LB ADL management and peri-care; set up for grooming tasks anticipated     Vision         Perception         Praxis         Pertinent Vitals/Pain Pain Assessment Pain Assessment: Faces Faces Pain Scale: Hurts little more Pain Location: L residual limb Pain Descriptors / Indicators: Guarding, Sore, Throbbing Pain Intervention(s): Monitored during session, Repositioned     Extremity/Trunk Assessment Upper Extremity Assessment Upper Extremity Assessment: LUE deficits/detail RUE Sensation: decreased light touch;history of peripheral neuropathy LUE Deficits / Details: L transradial amputation 04/2023   Lower Extremity Assessment Lower Extremity Assessment: RLE deficits/detail;LLE deficits/detail RLE Deficits / Details: chronic R BKA RLE Sensation: decreased light touch;history of peripheral neuropathy LLE Deficits / Details: L AKA, surgical site clean and sutures/staples intact; very large residual limb LLE Sensation: history of peripheral neuropathy;decreased light touch   Cervical / Trunk Assessment Cervical / Trunk Assessment: Normal   Communication Communication Communication: No apparent difficulties   Cognition Arousal: Alert Behavior During Therapy: WFL for tasks assessed/performed                                 Following commands: Intact       Cueing  General Comments   Cueing Techniques: Verbal cues;Gestural cues      Exercises     Shoulder Instructions      Home Living Family/patient expects to be discharged to:: Skilled nursing facility                                 Additional Comments: pt would  like to d/c to SNF      Prior Functioning/Environment Prior Level of Function : Needs assist             Mobility Comments: pt states he sleeps in recliner, uses power wheel chair. recent prolonged hospitalization at [facility], pt states he was walking very short distances with RLE prosthesis (does not fit appropriately, and has not since Fall 2024) and L PFRW with PT. ADLs Comments: pt states he was MOD I with ADLs (question accuracy of this as pt states he was d/c from [name] to hotel). he was only home at his new apt for few days before returning to [facility].    OT Problem List: Decreased strength;Decreased knowledge of use of DME or AE;Impaired vision/perception;Decreased range of motion;Decreased coordination;Decreased knowledge of precautions;Obesity;Decreased activity tolerance;Decreased cognition;Impaired UE functional use;Impaired balance (sitting and/or standing);Decreased safety awareness;Impaired sensation;Pain   OT Treatment/Interventions: Self-care/ADL training;Visual/perceptual remediation/compensation;Therapeutic exercise;Patient/family education;Balance training;Therapeutic  activities;Energy conservation;DME and/or AE instruction;Cognitive remediation/compensation      OT Goals(Current goals can be found in the care plan section)   Acute Rehab OT Goals Patient Stated Goal: go to rehab OT Goal Formulation: With patient Time For Goal Achievement: 10/02/23 Potential to Achieve Goals: Good   OT Frequency:  Min 2X/week    Co-evaluation PT/OT/SLP Co-Evaluation/Treatment: Yes Reason for Co-Treatment: For patient/therapist safety;To address functional/ADL transfers PT goals addressed during session: Mobility/safety with mobility;Balance;Strengthening/ROM OT goals addressed during session: ADL's and self-care;Proper use of Adaptive equipment and DME      AM-PAC OT 6 Clicks Daily Activity     Outcome Measure Help from another person eating meals?: A Little Help from another  person taking care of personal grooming?: A Little Help from another person toileting, which includes using toliet, bedpan, or urinal?: A Lot Help from another person bathing (including washing, rinsing, drying)?: A Lot Help from another person to put on and taking off regular upper body clothing?: A Little Help from another person to put on and taking off regular lower body clothing?: A Lot 6 Click Score: 15   End of Session Nurse Communication: Mobility status  Activity Tolerance: Patient tolerated treatment well;Patient limited by fatigue Patient left: in bed;with call bell/phone within reach;with bed alarm set  OT Visit Diagnosis: Other abnormalities of gait and mobility (R26.89);Muscle weakness (generalized) (M62.81);Pain Pain - Right/Left: Left Pain - part of body: Leg                Time: 9245-9180 OT Time Calculation (min): 25 min Charges:  OT General Charges $OT Visit: 1 Visit OT Evaluation $OT Eval Moderate Complexity: 1 Mod  Taleeya Blondin, OTR/L  09/18/23, 12:49 PM   Morley Gaumer E Ricka Westra 09/18/2023, 12:37 PM

## 2023-09-18 NOTE — ED Provider Notes (Signed)
-----------------------------------------   4:05 AM on 09/18/2023 -----------------------------------------   Blood pressure 106/63, pulse 68, temperature 98.1 F (36.7 C), temperature source Oral, resp. rate 18, height 1.803 m (5' 11), weight (!) 148.8 kg, SpO2 100%.  The patient is calm and cooperative at this time.  There have been no acute events since the last update.  Awaiting disposition plan from [facility] team.   Gordan Huxley, MD 09/18/23 [phone]

## 2023-09-19 LAB — CBG MONITORING, ED
Glucose-Capillary: 173 mg/dL — ABNORMAL HIGH (ref 70–99)
Glucose-Capillary: 213 mg/dL — ABNORMAL HIGH (ref 70–99)
Glucose-Capillary: 185 mg/dL — ABNORMAL HIGH (ref 70–99)

## 2023-09-19 MED ORDER — MINERAL OIL RE ENEM
1.0000 | ENEMA | Freq: Once | RECTAL | Status: AC
  Administered 2023-09-19: 1 via RECTAL

## 2023-09-19 MED ORDER — BISACODYL 10 MG RE SUPP
10.0000 mg | Freq: Once | RECTAL | Status: AC
  Administered 2023-09-19: 10 mg via RECTAL
  Filled 2023-09-19: qty 1

## 2023-09-19 NOTE — ED Notes (Signed)
 Pt states has been trying to have bowel movement since getting Dulcolax at 5pm but feels like he has a large ball of stool that he cannot get out. MD notified.

## 2023-09-19 NOTE — ED Notes (Signed)
 RN at bedside to answer call bell. Pt diaphoretic and reporting feeling unwell after his nap. Pt afebrile and denies CP or SOB. EDP made aware

## 2023-09-19 NOTE — ED Notes (Signed)
 Pt had voided softball sized stool. Enema performed and held in for 10 mins. Pt denies urge to have bowel movement and attempts anyway - only output enema solution.

## 2023-09-19 NOTE — TOC Progression Note (Signed)
 Transition of Care [facility]) - Progression Note    Patient Details  Name: Russell [name] MRN: [id number] Date of Birth: 30-Sep-1965  Transition of [facility]) CM/SW Contact  [name], LCSW Phone Number: 09/19/2023, 12:04 PM  Clinical Narrative:     SW contacted [name] at [phone] at [facility].  No Answer. SW left voice mail.  SW contacted [name] at [phone].  Admissions went out for lunch.  SW left contact information to contact sw regarding accepted placement.        Expected Discharge Plan and Services                                               Social Determinants of Health (SDOH) Interventions SDOH Screenings   Food Insecurity: Unknown (09/07/2023)  Housing: Low Risk  (09/07/2023)  Transportation Needs: No Transportation Needs (09/07/2023)  Utilities: Not At Risk (09/07/2023)  Financial Resource Strain: Low Risk  (04/04/2023)   Received from [facility]  Tobacco Use: Low Risk  (09/07/2023)    Readmission Risk Interventions    03/03/2023   12:27 PM 10/25/2022    3:55 PM  Readmission Risk Prevention Plan  Transportation Screening Complete Complete  PCP or Specialist Appt within 3-5 Days  Complete  HRI or Home Care Consult  Complete  Social Work Consult for Recovery Care Planning/Counseling  Complete  Palliative Care Screening  Not Applicable  Medication Review Oceanographer) Complete Complete  PCP or Specialist appointment within 3-5 days of discharge Complete   HRI or Home Care Consult Complete   Palliative Care Screening Not Applicable   Skilled Nursing Facility Not Applicable

## 2023-09-19 NOTE — ED Provider Notes (Signed)
-----------------------------------------   8:23 AM on 09/19/2023 -----------------------------------------   Blood pressure (!) 103/59, pulse 60, temperature 98.6 F (37 C), temperature source Oral, resp. rate 17, height 5' 11 (1.803 m), weight (!) 148.8 kg, SpO2 99%.  The patient is calm and cooperative at this time.  There have been no acute events since the last update.  Awaiting disposition plan from case management/social work.    Ura Yingling, Josette SAILOR, DO 09/19/23 [phone]

## 2023-09-19 NOTE — ED Provider Notes (Signed)
 Emergency Medicine Observation Re-evaluation Note  Russell [name] is a 58 y.o. male, seen on rounds today.  Pt initially presented to the ED for complaints of Fall Currently, the patient is awaiting transitions of care.  His only complaint today is that he feels constipated  Physical Exam  BP (!) 101/50   Pulse 67   Temp 98.3 F (36.8 C) (Oral)   Resp 16   Ht 5' 11 (1.803 m)   Wt (!) 148.8 kg   SpO2 96%   BMI 45.75 kg/m  Physical Exam General: Well-appearing in no distress working on computer Cardiac: Well-perfused Lungs: No respiratory distress Psych: No SI HI  Staples and sutures still in place on AKI  ED Course / MDM  EKG:   I have reviewed the labs performed to date as well as medications administered while in observation.  Recent changes in the last 24 hours include ordered a suppository given patient's preference  Plan  Current plan is for [facility] for long-term placement    Nicholaus Rolland FORBES, MD 09/19/23 [phone]

## 2023-09-19 NOTE — ED Notes (Signed)
 Pt had a large bm. Pt cleaned with warm peri wipes, clean chux placed, pt given warm blankets, call light within reach. Pt has no further needs at this time.

## 2023-09-20 LAB — CBG MONITORING, ED
Glucose-Capillary: 142 mg/dL — ABNORMAL HIGH (ref 70–99)
Glucose-Capillary: 210 mg/dL — ABNORMAL HIGH (ref 70–99)
Glucose-Capillary: 213 mg/dL — ABNORMAL HIGH (ref 70–99)
Glucose-Capillary: 219 mg/dL — ABNORMAL HIGH (ref 70–99)
Glucose-Capillary: 295 mg/dL — ABNORMAL HIGH (ref 70–99)

## 2023-09-20 NOTE — ED Notes (Signed)
 Pt had BM and changed by this RN and Tax inspector

## 2023-09-20 NOTE — Progress Notes (Signed)
 Physical Therapy Treatment Patient Details Name: TRESHAWN ALLEN MRN: [id number] DOB: 1966/02/22 Today's Date: 09/20/2023   History of Present Illness Pt is a 58 y/o male admitted secondary to fall off of his bed at home. Pt requesting SNF placement. 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 Confirms PTA in early July before his L AKA, he was ambulating household distances using a RW, reliant on power w/c for community distances. Was independent with his ADL's. Currently since last admission has been only able to mobilize in bed, needed assist with all mobility and ADL's.   To date, pt is only supervision for mobility supine to sitting EOB to the L. Performed multiple therex EOB with excellent sitting balance appreciated. Completed bathing ADL's in sitting. Difficulty with R side of body due to L trans radial amputation. SBA provided for all bathing. ModA needed for L lateral scooting. Difficulty going to L due to L trans radial amputation and inability to grip bed rail. Once close enough to grab with RUE able to scoot with no physical assist. Overall good understanding however of head/hips relationship to assist. Body habitus and lack of functional limbs are biggest barrier to completing activities. Unable to trial standing as pt does not have needed stockings, and parts of socket to safely don prosthesis in room. Pt very motivated with good tolerance for all activities today. Pt below prior baseline, has goals to stand and ambulate with B prosthetics or at least transfer one day. Will benefit from skilled PT services to address deficits and reduce  falls risk to maximize independence with ADL's. All needs in reach sitting EOB. RN notified.      If plan is discharge home, recommend the following: Two people to help with walking and/or transfers;Two people to help with bathing/dressing/bathroom;Assistance with cooking/housework;Assist for transportation   Can travel by private vehicle     No  Equipment Recommendations  Other (comment) (TBD by next venue of care)    Recommendations for Other Services       Precautions / Restrictions Precautions Precautions: Fall Recall of Precautions/Restrictions: Impaired Precaution/Restrictions Comments: chronic R BKA, acute L AKA, L transradial amputation, foley Restrictions Weight Bearing Restrictions Per Provider Order: No Other Position/Activity Restrictions: No WB'ing in MD orders but maintained NWB'ing through L residual limb     Mobility  Bed Mobility Overal bed mobility: Needs Assistance Bed Mobility: Supine to Sit       Sit to supine: Supervision, HOB elevated, Used rails   General bed mobility comments: increased time, bouts of momentum and bed features but no physical assist today. Patient Response: Cooperative  Transfers Overall transfer level: Needs assistance                Lateral/Scoot Transfers: Mod assist General transfer comment: 2-3 L lateral scoots. Needs modA on chuck pad. Difficulty going to the L due to L transradial amputation. Heavy L lean to use RUE on bed rail to pull himself up with more success and no physical support.    Ambulation/Gait               General Gait Details: deferred  until pt is able to get prosthetic correctly donned.   Stairs             Wheelchair Mobility     Tilt Bed Tilt Bed Patient Response: Cooperative  Modified Rankin (Stroke Patients Only)       Balance Overall balance assessment: Needs assistance Sitting-balance support: Feet unsupported, No upper extremity supported Sitting balance-Leahy  Scale: Good Sitting balance - Comments: able to lean anteriorly outside of BOS without UE support and no LOB.                                    Communication Communication Communication: No apparent difficulties  Cognition Arousal: Alert Behavior During Therapy: WFL for tasks assessed/performed   PT - Cognitive impairments: No apparent impairments                       PT - Cognition Comments: very pleasant and cooperative. Able to follow all commands. Very motivated to participate and improve. Following commands: Intact      Cueing Cueing Techniques: Verbal cues, Gestural cues  Exercises General Exercises - Lower Extremity Long Arc Quad: AROM, Strengthening, Right, 10 reps, Seated Other Exercises Other Exercises: scap retractions, BUE shoulder flexion x10/exercise. BUE alternating reaching anterior to BOS in varying planes (forward, inferior, superior, R/L lateral) Seated bathing with wash cloth to face, arm pits, neck. Brushed teeth EOB    General Comments        Pertinent Vitals/Pain Pain Assessment Pain Assessment: Faces Faces Pain Scale: No hurt    Home Living                          Prior Function            PT Goals (current goals can now be found in the care plan section) Acute Rehab PT Goals Patient Stated Goal: go to rehab PT Goal Formulation: With patient Time For Goal Achievement: 10/02/23 Potential to Achieve Goals: Good Progress towards PT goals: Progressing toward goals    Frequency    Min 2X/week      PT Plan      Co-evaluation              AM-PAC PT 6 Clicks Mobility   Outcome Measure  Help needed turning from your back to your side while in a flat bed without using bedrails?: A Little Help needed moving from lying on your back to sitting on the side of a flat bed without using bedrails?: A Little Help needed moving to and from a bed to a chair (including a wheelchair)?: Total Help needed  standing up from a chair using your arms (e.g., wheelchair or bedside chair)?: Total Help needed to walk in hospital room?: Total Help needed climbing 3-5 steps with a railing? : Total 6 Click Score: 10    End of Session   Activity Tolerance: Patient tolerated treatment well Patient left: with call bell/phone within reach;with bed alarm set (seated EOB) Nurse Communication: Mobility status PT Visit Diagnosis: Other abnormalities of gait and mobility (R26.89)     Time: 8647-8575 PT Time Calculation (min) (ACUTE ONLY): 32 min  Charges:    $Therapeutic Exercise: 8-22 mins $Therapeutic Activity: 8-22 mins PT General Charges $$ ACUTE PT VISIT: 1 Visit  Dorina HERO. Fairly IV, PT, DPT Physical Therapist- [facility] 09/20/2023, 2:58 PM

## 2023-09-20 NOTE — ED Provider Notes (Signed)
 Emergency Medicine Observation Re-evaluation Note  Russell [name] is a 58 y.o. male, seen on rounds today.  Pt initially presented to the ED for complaints of Fall  Currently, the patient is is no acute distress. Denies any concerns at this time.  Physical Exam  Blood pressure (!) 106/51, pulse 63, temperature 98.6 F (37 C), temperature source Oral, resp. rate 15, height 5' 11 (1.803 m), weight (!) 148.8 kg, SpO2 96%.  Physical Exam: General: No apparent distress Psych: Resting comfortably     ED Course / MDM   Clinical Course as of 09/20/23 0641  Sat Sep 17, 2023  0112 CBC with Differential(!) Largely reassuring. Improved from 4 days ago [DW]  Mon Sep 19, 2023  1610 Patient examined, denies any chest pain shortness of breath his only complaint is constipation.  Will order him a suppository [HD]    Clinical Course User Index [DW] Malvina Alm DASEN, MD [HD] Nicholaus Rolland FORBES, MD    I have reviewed the labs performed to date as well as medications administered while in observation.  Recent changes in the last 24 hours include: No acute events overnight.  Plan   Current plan: Patient awaiting TOC placement   Suzanne Kirsch, MD 09/20/23 [phone]

## 2023-09-20 NOTE — ED Notes (Signed)
 Pt called out. Pt had a BM. This NT cleaned pt up. Pt has a complete bed change. Pt is resting comfortably at this time.

## 2023-09-20 NOTE — ED Notes (Addendum)
 RN taking over care of pt at this time. Pt reported to ED d/t fall while trying to pee. Pt slipped off the mattress and is reporting back pain. 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 Pt endorsing 8/10 back pain at this time and requesting pain meds when possible. Pt ABCs intact. RR even and unlabored. Pt looks mildly uncomfortable. Bed in lowest locked position. Call bell in reach. Denies needs at this time.   Past Medical History:  Diagnosis Date   Anxiety    CHF (congestive heart failure) (HCC)    EF 30-35%   Chronic diastolic (congestive) heart failure (HCC)    Depression    Diabetes mellitus without complication (HCC)    Hyperlipemia    Hypertension    Morbid obesity (HCC)    Peripheral edema

## 2023-09-21 LAB — CBG MONITORING, ED
Glucose-Capillary: 171 mg/dL — ABNORMAL HIGH (ref 70–99)
Glucose-Capillary: 148 mg/dL — ABNORMAL HIGH (ref 70–99)
Glucose-Capillary: 183 mg/dL — ABNORMAL HIGH (ref 70–99)
Glucose-Capillary: 177 mg/dL — ABNORMAL HIGH (ref 70–99)

## 2023-09-21 NOTE — Progress Notes (Signed)
 Occupational Therapy Treatment Patient Details Name: Russell [name] MRN: [id number] DOB: 10-Dec-1965 Today's Date: 09/21/2023   History of present illness Pt is a 58 y/o male admitted secondary to fall off of his bed at home. Pt requesting SNF placement. 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 Upon arrival to room pt sidelying in bed, eager and agreeable to tx. Pt requires MIN A sidelying>sit, good sitting balance. MOD A x2 lateral scoot bed<>chair, assist for navigating ~1-2 inch height difference from bed to chair. Pt making good progress toward goals, will continue to follow POC. Discharge recommendation remains appropriate.       If plan is discharge home, recommend the following:  Two people to help with walking and/or transfers;A lot of help with bathing/dressing/bathroom;Assistance with cooking/housework;Direct supervision/assist for financial management;Supervision due to cognitive status;Direct supervision/assist for medications management;Help with stairs or ramp for entrance;Assist for transportation   Equipment Recommendations  [place] lift;Hospital bed;BSC/3in1    Recommendations for Other Services      Precautions / Restrictions Precautions Precautions: Fall Precaution/Restrictions Comments: chronic R BKA, acute L AKA, L transradial amputation, foley Restrictions Weight Bearing Restrictions Per Provider Order: No       Mobility Bed Mobility Overal bed mobility: Needs Assistance Bed Mobility: Supine to Sit, Sit to Supine     Supine to sit: Min assist Sit to supine: Supervision        Transfers Overall  transfer level: Needs assistance Equipment used: None Transfers: Bed to chair/wheelchair/BSC            Lateral/Scoot Transfers: Mod assist, +2 safety/equipment       Balance Overall balance assessment: Needs assistance Sitting-balance support: Feet unsupported, No upper extremity supported Sitting balance-Leahy Scale: Good                                     ADL either performed or assessed with clinical judgement   ADL Overall ADL's : Needs assistance/impaired                                       General ADL Comments: MOD A x2 for simulated BSC t/f.     Communication Communication Communication: No apparent difficulties   Cognition Arousal: Alert Behavior During Therapy: WFL for tasks assessed/performed Cognition: No apparent impairments                               Following commands: Intact        Cueing      Exercises      Shoulder Instructions       General Comments      Pertinent Vitals/ Pain       Pain Assessment Pain Assessment: Faces Faces Pain Scale: Hurts little more Pain Location: L residual limb, R hip Pain Descriptors / Indicators: Guarding, Sore, Throbbing Pain Intervention(s): Limited activity within patient's tolerance, Premedicated before session  Home Living  Prior Functioning/Environment              Frequency  Min 2X/week        Progress Toward Goals  OT Goals(current goals can now be found in the care plan section)  Progress towards OT goals: Progressing toward goals  Acute Rehab OT Goals OT Goal Formulation: With patient Time For Goal Achievement: 10/02/23 Potential to Achieve Goals: Good  Plan      Co-evaluation    PT/OT/SLP Co-Evaluation/Treatment: Yes Reason for Co-Treatment: For patient/therapist safety;To address functional/ADL transfers PT goals addressed during session: Mobility/safety with  mobility;Balance;Strengthening/ROM OT goals addressed during session: ADL's and self-care;Proper use of Adaptive equipment and DME      AM-PAC OT 6 Clicks Daily Activity     Outcome Measure   Help from another person eating meals?: A Little Help from another person taking care of personal grooming?: A Little Help from another person toileting, which includes using toliet, bedpan, or urinal?: A Lot Help from another person bathing (including washing, rinsing, drying)?: A Lot Help from another person to put on and taking off regular upper body clothing?: A Little Help from another person to put on and taking off regular lower body clothing?: A Lot 6 Click Score: 15    End of Session    OT Visit Diagnosis: Other abnormalities of gait and mobility (R26.89);Muscle weakness (generalized) (M62.81);Pain Pain - Right/Left: Left Pain - part of body: Leg   Activity Tolerance Patient tolerated treatment well;Patient limited by fatigue   Patient Left in bed;with call bell/phone within reach;with bed alarm set   Nurse Communication Mobility status        Time: 8891-8862 OT Time Calculation (min): 29 min  Charges: OT General Charges $OT Visit: 1 Visit OT Treatments $Self Care/Home Management : 8-22 mins  Elston Slot, M.S. OTR/L  09/21/23, 2:37 PM  ascom [phone]

## 2023-09-21 NOTE — Progress Notes (Signed)
 Physical Therapy Treatment Patient Details Name: Russell [name] MRN: [id number] DOB: [birth year] Today's Date: 09/21/2023   History of Present Illness Pt is a 58 y/o male admitted secondary to fall off of his bed at home. Pt requesting SNF placement. 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 [name] and bouts of momentum to sit EOB. Does rely on modA+2 or maxA+1 for R and L lateral scoots to recliner and from recliner back to bed. When pt is able to reach/pull with his RUE, pt only needs CGA. Pt returning to supine performing listed therex as below. Reviewed importance of joint and muscle length integrity as pt has lofty goals for walking with B prosthetics one day. Pt understanding with good motivation throughout session. Pt left with R BKA knee extended on towel roll with all needs in reach. D/c recs remain appropriate.      If plan is discharge home, recommend the following: Two people to help with walking and/or transfers;Two people to help with bathing/dressing/bathroom;Assistance with cooking/housework;Assist for transportation   Can travel by private vehicle     No  Equipment Recommendations  Other (comment) (TBD by next venue of care)    Recommendations for Other Services       Precautions / Restrictions Precautions Precautions: Fall Recall of Precautions/Restrictions: Impaired Precaution/Restrictions Comments: chronic R BKA, acute L AKA, L transradial amputation, foley Restrictions Weight Bearing Restrictions Per Provider Order: No Other Position/Activity Restrictions: No WB'ing in MD orders but maintained NWB'ing  through L residual limb     Mobility  Bed Mobility Overal bed mobility: Needs Assistance Bed Mobility: Supine to Sit     Supine to sit: Min assist, HOB elevated, Used rails       Patient Response: Cooperative  Transfers Overall transfer level: Needs assistance Equipment used: None Transfers: Bed to chair/wheelchair/BSC            Lateral/Scoot Transfers: Mod assist, +2 physical assistance General transfer comment: No physical assist needed when RUE can reach arm rest for recliner. Otherwise needs modA+2 on chuck pad for lateral scooting    Ambulation/Gait               General Gait Details: deferred until pt is able to get prosthetic correctly donned.   Stairs             Wheelchair Mobility     Tilt Bed Tilt Bed Patient Response: Cooperative  Modified Rankin (Stroke Patients Only)       Balance Overall balance assessment: Needs assistance Sitting-balance support: Feet unsupported, No upper extremity supported Sitting balance-Leahy Scale: Good         Standing balance comment: NA, bil amputations and did not have prosthetic                            Communication Communication Communication: No apparent difficulties  Cognition Arousal: Alert Behavior During Therapy: WFL for tasks assessed/performed   PT - Cognitive impairments: No apparent impairments  PT - Cognition Comments: very pleasant and cooperative. Able to follow all commands. Very motivated to participate and improve. Following commands: Intact      Cueing Cueing Techniques: Verbal cues, Gestural cues  Exercises General Exercises - Lower Extremity Short Arc Quad: AROM, Strengthening, Right, 10 reps, Supine (manual resistance provided) Hip ABduction/ADduction: AROM, Strengthening, Right, 10 reps, Supine (manual resistance provided.) Straight Leg Raises: AROM, Strengthening, Right, 10 reps, Supine (manual resistance provided) Hip  Flexion/Marching: AROM, Strengthening, Right, 5 reps, Supine    General Comments        Pertinent Vitals/Pain Pain Assessment Pain Assessment: Faces Faces Pain Scale: Hurts little more Pain Location: L residual limb, R hip Pain Descriptors / Indicators: Guarding, Sore, Throbbing Pain Intervention(s): Limited activity within patient's tolerance, Monitored during session, Repositioned    Home Living                          Prior Function            PT Goals (current goals can now be found in the care plan section) Acute Rehab PT Goals Patient Stated Goal: go to rehab PT Goal Formulation: With patient Time For Goal Achievement: 10/02/23 Potential to Achieve Goals: Good Progress towards PT goals: Progressing toward goals    Frequency    Min 2X/week      PT Plan      Co-evaluation PT/OT/SLP Co-Evaluation/Treatment: Yes Reason for Co-Treatment: For patient/therapist safety;To address functional/ADL transfers PT goals addressed during session: Mobility/safety with mobility;Balance;Strengthening/ROM OT goals addressed during session: ADL's and self-care;Proper use of Adaptive equipment and DME      AM-PAC PT 6 Clicks Mobility   Outcome Measure  Help needed turning from your back to your side while in a flat bed without using bedrails?: A Lot Help needed moving from lying on your back to sitting on the side of a flat bed without using bedrails?: A Lot Help needed moving to and from a bed to a chair (including a wheelchair)?: A Lot Help needed standing up from a chair using your arms (e.g., wheelchair or bedside chair)?: Total Help needed to walk in hospital room?: Total Help needed climbing 3-5 steps with a railing? : Total 6 Click Score: 9    End of Session   Activity Tolerance: Patient tolerated treatment well Patient left: in bed;with call bell/phone within reach;with bed alarm set Nurse Communication: Mobility status PT Visit Diagnosis: Other  abnormalities of gait and mobility (R26.89)     Time: 8879-8851 PT Time Calculation (min) (ACUTE ONLY): 28 min  Charges:    $Therapeutic Exercise: 8-22 mins PT General Charges $$ ACUTE PT VISIT: 1 Visit                    Dorina HERO. Fairly IV, PT, DPT Physical Therapist- [facility] 09/21/2023, 1:12 PM

## 2023-09-21 NOTE — ED Notes (Signed)
 Pt sleeping comfortably in ED stretcher. Pt ABCs intact. RR even and unlabored. Pt in NAD. Bed in lowest locked position. Call bell in reach.

## 2023-09-21 NOTE — ED Notes (Signed)
 Pt ABCs intact. RR even and unlabored. Pt in NAD. Bed in lowest locked position. Call bell in reach. Denies needs at this time.

## 2023-09-21 NOTE — ED Provider Notes (Signed)
 Emergency Medicine Observation Re-evaluation Note  Russell [name] is a 58 y.o. male, seen on rounds today.  Pt initially presented to the ED for complaints of Fall  Currently, the patient is resting in bed. No reported issues from nursing team.   Physical Exam  BP (!) 113/54   Pulse 64   Temp 97.8 F (36.6 C) (Oral)   Resp 16   Ht 5' 11 (1.803 m)   Wt (!) 148.8 kg   SpO2 98%   BMI 45.75 kg/m  Physical Exam General: Resting in bed  ED Course / MDM   BGL 148 to 295 over last 24 hours  Plan  Current plan is for dispo per social work.    Levander Slate, MD 09/21/23 [phone]

## 2023-09-21 NOTE — ED Notes (Signed)
 Pt sleeping peacefully in bed. Pt ABCs intact. RR even and unlabored. Pt in NAD. Bed in lowest locked position. Call bell in reach.

## 2023-09-21 NOTE — TOC CM/SW Note (Signed)
..  Transition of Care [facility]) - Inpatient Brief Assessment   Patient Details  Name: Russell [name] MRN: [id number] Date of Birth: 1965/07/15  Transition of Care [facility]) CM/SW Contact:    [name], LCSW Phone Number: 09/21/2023, 5:16 PM   Clinical Narrative:  SW received message that pt was denied authourization for SNF for rehab.  SW met with pt.  SW expressed to pt that his insurance  [facility]) denied authorization for rehab at facility however pt can appeal.  Pt would like for SW to assist pt with appeal process. SW contacted [employer] at [phone].  SW was told that since pt is dual this is not the right number. New number provided [phone] and sw was transferred.  SW was on hold and then call went to survey.  SW will follow up to assist pt with appeal since [facility] is now closed  Transition of Care Asessment:

## 2023-09-22 LAB — CBG MONITORING, ED
Glucose-Capillary: 195 mg/dL — ABNORMAL HIGH (ref 70–99)
Glucose-Capillary: 229 mg/dL — ABNORMAL HIGH (ref 70–99)
Glucose-Capillary: 202 mg/dL — ABNORMAL HIGH (ref 70–99)
Glucose-Capillary: 175 mg/dL — ABNORMAL HIGH (ref 70–99)

## 2023-09-22 NOTE — TOC CM/SW Note (Signed)
..  Transition of Care [facility]) - Inpatient Brief Assessment   Patient Details  Name: SHAWNMICHAEL PARENTEAU MRN: [id number] Date of Birth: 1965/04/24  Transition of Care [facility]) CM/SW Contact:    [name], LCSW Phone Number: 09/22/2023, 2:44 PM   Clinical Narrative:  SW meet with pt at bedside to call [facility] to appeal decision to deny SNF placement for Rehab.  SW called [phone].  SW and pt spoke with representative.  [employer] representative stated that request was denied on 09/21/23 because treatment is not consist with clinical evidence. SW and pt submitted request for appeal.  Request for appeal submitted verbally.  Reference ID: [id number]. Request to expedited by sw.  [employer] rep stated that request has been expedited and response will be know in 73 hours. [employer] will reach out to provider regarding decision and mail pt letter however pt can call on Monday, 09/26/23 with results.  SW also assisted pt with updating his address and contact number with [employer]. New address: [address], [place], [place] [address].  Phone number: [phone].    Transition of Care Asessment:

## 2023-09-22 NOTE — TOC CM/SW Note (Signed)
..  Transition of Care [facility]) - Inpatient Brief Assessment   Patient Details  Name: JAKELL TRUSTY MRN: [id number] Date of Birth: 1965-06-03  Transition of Care [facility]) CM/SW Contact:    [name], LCSW Phone Number: 09/22/2023, 3:08 PM   Clinical Narrative:  SW met with pt to discuss alternatives if denied for placement.  Pt continued to say why would they deny me.  Pt stated that he tried [facility] and fell at home.  EMT had to come get his off the floor and told pt if he falls again they will send him to ED and this is reason for his current visit. SW expressed to pt that sw will follow up with him Monday to call regarding decision for SNF rehab placement.  Transition of Care Asessment:

## 2023-09-22 NOTE — ED Provider Notes (Signed)
-----------------------------------------   7:24 AM on 09/22/2023 -----------------------------------------   Blood pressure (!) 113/54, pulse 64, temperature 97.8 F (36.6 C), temperature source Oral, resp. rate 16, height 5' 11 (1.803 m), weight (!) 148.8 kg, SpO2 98%.  The patient is calm and cooperative at this time.  There have been no acute events since the last update.  Awaiting disposition plan from case management/social work.    Suzanne Kirsch, MD 09/22/23 [phone]

## 2023-09-23 LAB — CBG MONITORING, ED
Glucose-Capillary: 217 mg/dL — ABNORMAL HIGH (ref 70–99)
Glucose-Capillary: 214 mg/dL — ABNORMAL HIGH (ref 70–99)
Glucose-Capillary: 123 mg/dL — ABNORMAL HIGH (ref 70–99)

## 2023-09-23 NOTE — Progress Notes (Signed)
 Physical Therapy Treatment Patient Details Name: Russell [name] MRN: [id number] DOB: 03-03-1965 Today's Date: 09/23/2023   History of Present Illness Pt is a 58 y/o male admitted secondary to fall off of his bed at home. Pt requesting SNF placement. 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  Pt noted he is awaiting discharge at any moment.  Pt agreeable to transition over to the recliner however in his waiting.  Room setup performed by OT/PT and pt was able to perform transfer without any physical assistance.  Pt then left with all needs met and in room with OT for assistance with hygiene.     If plan is discharge home, recommend the following: Two people to help with walking and/or transfers;Two people to help with bathing/dressing/bathroom;Assistance with cooking/housework;Assist for transportation   Can travel by private vehicle     No  Equipment Recommendations  Other (comment) (TBD by next venue of care)    Recommendations for Other Services       Precautions / Restrictions Precautions Precautions: Fall Recall of Precautions/Restrictions: Impaired Precaution/Restrictions Comments: chronic R BKA, acute L AKA, L transradial amputation, foley Restrictions Weight Bearing Restrictions Per Provider Order: No Other Position/Activity Restrictions: No WB'ing in MD orders but maintained NWB'ing through L residual limb     Mobility  Bed Mobility Overal bed mobility: Needs Assistance Bed Mobility: Supine to Sit     Supine to sit: Contact guard, Used rails, HOB elevated     General bed mobility comments:  Increased time and effort, no physical assistance to complete    Transfers Overall transfer level: Needs assistance Equipment used: None Transfers: Bed to chair/wheelchair/BSC            Lateral/Scoot Transfers: Contact guard assist, +2 safety/equipment General transfer comment: Reached with RUE towards direction of chair    Ambulation/Gait               General Gait Details: deferred until pt is able to get prosthetic correctly donned.   Stairs             Wheelchair Mobility     Tilt Bed    Modified Rankin (Stroke Patients Only)       Balance Overall balance assessment: Needs assistance Sitting-balance support: Single extremity supported Sitting balance-Leahy Scale: Good Sitting balance - Comments: Steady reaching outside BOS during transfers       Standing balance comment: NA, bil amputations and did not have prosthetic                            Communication Communication Communication: No apparent difficulties  Cognition Arousal: Alert Behavior During Therapy: WFL for tasks assessed/performed                             Following commands: Intact      Cueing Cueing Techniques: Verbal cues  Exercises      General Comments General comments (skin integrity, edema, etc.): LLE post op incisions D/C/I pre/post session      Pertinent Vitals/Pain Pain  Assessment Pain Assessment: Faces Faces Pain Scale: Hurts a little bit Pain Location: Back Pain Descriptors / Indicators: Discomfort, Grimacing Pain Intervention(s): Limited activity within patient's tolerance, Repositioned    Home Living                          Prior Function            PT Goals (current goals can now be found in the care plan section) Acute Rehab PT Goals Patient Stated Goal: go to rehab PT Goal Formulation: With patient Time For Goal Achievement: 10/02/23 Potential to Achieve Goals: Good Progress towards PT goals:  Progressing toward goals    Frequency    Min 2X/week      PT Plan      Co-evaluation   Reason for Co-Treatment: For patient/therapist safety;To address functional/ADL transfers PT goals addressed during session: Mobility/safety with mobility;Balance;Strengthening/ROM OT goals addressed during session: ADL's and self-care;Proper use of Adaptive equipment and DME      AM-PAC PT 6 Clicks Mobility   Outcome Measure  Help needed turning from your back to your side while in a flat bed without using bedrails?: A Lot Help needed moving from lying on your back to sitting on the side of a flat bed without using bedrails?: A Lot Help needed moving to and from a bed to a chair (including a wheelchair)?: A Lot Help needed standing up from a chair using your arms (e.g., wheelchair or bedside chair)?: Total Help needed to walk in hospital room?: Total Help needed climbing 3-5 steps with a railing? : Total 6 Click Score: 9    End of Session   Activity Tolerance: Patient tolerated treatment well Patient left: with call bell/phone within reach;in chair Nurse Communication: Mobility status PT Visit Diagnosis: Other abnormalities of gait and mobility (R26.89)     Time: 8580-8571 PT Time Calculation (min) (ACUTE ONLY): 9 min  Charges:    $Therapeutic Exercise: 8-22 mins PT General Charges $$ ACUTE PT VISIT: 1 Visit                     Fonda Simpers, PT, DPT Physical Therapist - [facility]  09/23/23, 3:45 PM

## 2023-09-23 NOTE — ED Notes (Signed)
 This Rn just called down to check the status of pt's breakfast tray. Was advised by dining that the patient's tray will be sent up shortly.

## 2023-09-23 NOTE — ED Provider Notes (Signed)
 Emergency Medicine Observation Re-evaluation Note  Russell [name] is a 58 y.o. male, seen on rounds today.  Pt initially presented to the ED for complaints of Fall  Currently, the patient is calm, no acute complaints.  Physical Exam  Blood pressure (!) 124/58, pulse 66, temperature 98.7 F (37.1 C), resp. rate 18, height 5' 11 (1.803 m), weight (!) 148.8 kg, SpO2 100%. Physical Exam General: NAD Lungs: CTAB Psych: not agitated  ED Course / MDM  EKG:    I have reviewed the labs performed to date as well as medications administered while in observation.  Recent changes in the last 24 hours include no acute events overnight.    Plan  Current plan is for dc to snf today   Viviann Pastor, MD 09/23/23 1401

## 2023-09-23 NOTE — TOC CM/SW Note (Signed)
..  Transition of Care [facility]) - Inpatient Brief Assessment   Patient Details  Name: Russell [name] MRN: [id number] Date of Birth: [birth year]  Transition of Care [facility]) CM/SW Contact:    [name], LCSW Phone Number: 09/23/2023, 11:50 AM   Clinical Narrative:  SW received call from pt.  Pt stated that he spoke with [facility] today and was told that his appeal was approved.  SW verified approval.  Message states [employer] Appeal Decision to approve services for authorization [phone]. Care coordinator will f/u with you on payment level. NRD is 09/23/2023. Disclaimer to follow.  SW contacted [facility].  Admissions will call sw back regarding placement for today.   Transition of Care Asessment:

## 2023-09-23 NOTE — TOC CM/SW Note (Signed)
..  Transition of Care [facility]) - Inpatient Brief Assessment   Patient Details  Name: Russell [name] MRN: [id number] Date of Birth: 11-08-1965  Transition of Care [facility]) CM/SW Contact:    [name], LCSW Phone Number: 09/23/2023, 12:17 PM   Clinical Narrative:  SW received message from [facility].  Ronal stated that pt can be admitted today.  SW sent message to staff that pt can be discharge to [place] today and assistance with transportation is needed. SW notified pt as well.   Transition of Care Asessment:

## 2023-09-23 NOTE — ED Provider Notes (Signed)
 Emergency Medicine Observation Re-evaluation Note   BP 123/69   Pulse 62   Temp 98.2 F (36.8 C) (Oral)   Resp 18   Ht 5' 11 (1.803 m)   Wt (!) 148.8 kg   SpO2 94%   BMI 45.75 kg/m    ED Course / MDM   No reported events during my shift at the time of this note.   Pt is awaiting dispo from consultants   Ginnie Shams MD    Shams Ginnie, MD 09/23/23 [phone]

## 2023-09-23 NOTE — ED Notes (Signed)
 Pt sitting up and eating lunch

## 2023-09-23 NOTE — Progress Notes (Signed)
 Occupational Therapy Treatment Patient Details Name: Russell [name] MRN: [id number] DOB: 01-11-1966 Today's Date: 09/23/2023   History of present illness Pt is a 58 y/o male admitted secondary to fall off of his bed at home. Pt requesting SNF placement. 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 Pt agreeable to tx to progress in independence of BADL tasks. Pt completed transfers from EOB<>recliner with CGA +2 for safety, via lateral scooting utilizing the drop down arm rest. Pt completed seated ADL tasks with setup assistance. Pt edu on what to expect of rehab and the benefits of skilled therapy services. Pt endorses excitement for rehab and his eagerness to obtain a new prothesis. Pt making good progress toward goals, will continue to follow POC. Discharge recommendation remains appropriate.        If plan is discharge home, recommend the following:  Two people to help with walking and/or transfers;A lot of help with bathing/dressing/bathroom;Assistance with cooking/housework;Direct supervision/assist for financial management;Supervision due to cognitive status;Direct supervision/assist for medications management;Help with stairs or ramp for entrance;Assist for transportation   Equipment Recommendations  Nadine lift;Hospital bed;BSC/3in1    Recommendations for Other Services      Precautions / Restrictions Precautions Precautions: Fall Recall of Precautions/Restrictions: Impaired Precaution/Restrictions Comments: chronic R BKA, acute L AKA, L transradial amputation, foley Restrictions Weight Bearing  Restrictions Per Provider Order: No Other Position/Activity Restrictions: No WB'ing in MD orders but maintained NWB'ing through L residual limb       Mobility Bed Mobility Overal bed mobility: Needs Assistance Bed Mobility: Supine to Sit     Supine to sit: Contact guard, Used rails, HOB elevated     General bed mobility comments: Increased time and effort, no physical assistance to complete    Transfers Overall transfer level: Needs assistance Equipment used: None Transfers: Bed to chair/wheelchair/BSC            Lateral/Scoot Transfers: Contact guard assist, +2 safety/equipment General transfer comment: Reached with RUE towards direction of chair     Balance Overall balance assessment: Needs assistance Sitting-balance support: Single extremity supported Sitting balance-Leahy Scale: Good Sitting balance - Comments: Steady reaching outside BOS during transfers                                   ADL either performed or assessed with clinical judgement   ADL Overall ADL's : Needs assistance/impaired     Grooming: Wash/dry hands;Wash/dry face;Oral care;Set up;Sitting                   Toilet Transfer: BSC/3in1;Contact guard assist;+2 for safety/equipment Toilet Transfer Details (indicate cue type and reason): simulated transfer into recliner         Functional mobility during ADLs: Contact guard assist;+2 for safety/equipment General ADL Comments: Simulated BSC t/f from the EOB<>recliner, required setup/positioning of recliner for safe scooting into chair    Extremity/Trunk Assessment              Vision       Perception  Praxis     Communication Communication Communication: No apparent difficulties   Cognition Arousal: Alert Behavior During Therapy: WFL for tasks assessed/performed Cognition: No apparent impairments             OT - Cognition Comments: A/Ox4                 Following commands: Intact         Cueing   Cueing Techniques: Verbal cues  Exercises Exercises: Other exercises Other Exercises Other Exercises: Edu: Limb positioning, safe ADL completion, transfer technique    Shoulder Instructions       General Comments LLE post op incisions D/C/I pre/post session    Pertinent Vitals/ Pain       Pain Assessment Pain Assessment: Faces Faces Pain Scale: Hurts a little bit Pain Location: Back Pain Descriptors / Indicators: Discomfort, Grimacing Pain Intervention(s): Limited activity within patient's tolerance, Repositioned, Monitored during session  Home Living                                          Prior Functioning/Environment              Frequency  Min 2X/week        Progress Toward Goals  OT Goals(current goals can now be found in the care plan section)  Progress towards OT goals: Progressing toward goals  Acute Rehab OT Goals OT Goal Formulation: With patient Time For Goal Achievement: 10/02/23 Potential to Achieve Goals: Good ADL Goals Pt Will Perform Grooming: with modified independence;sitting Pt Will Perform Lower Body Bathing: with mod assist;sitting/lateral leans Pt Will Perform Upper Body Dressing: with modified independence;sitting Pt Will Perform Lower Body Dressing: with mod assist;sitting/lateral leans  Plan      Co-evaluation    PT/OT/SLP Co-Evaluation/Treatment: Yes Reason for Co-Treatment: For patient/therapist safety;To address functional/ADL transfers PT goals addressed during session: Mobility/safety with mobility;Balance;Strengthening/ROM OT goals addressed during session: ADL's and self-care;Proper use of Adaptive equipment and DME      AM-PAC OT 6 Clicks Daily Activity     Outcome Measure   Help from another person eating meals?: A Little Help from another person taking care of personal grooming?: A Little Help from another person toileting, which includes using toliet, bedpan, or urinal?: A  Lot Help from another person bathing (including washing, rinsing, drying)?: A Lot Help from another person to put on and taking off regular upper body clothing?: A Little Help from another person to put on and taking off regular lower body clothing?: A Lot 6 Click Score: 15    End of Session    OT Visit Diagnosis: Other abnormalities of gait and mobility (R26.89);Muscle weakness (generalized) (M62.81);Pain Pain - Right/Left: Left Pain - part of body: Leg   Activity Tolerance Patient tolerated treatment well   Patient Left in chair;with call bell/phone within reach   Nurse Communication Mobility status        Time: 8570-8560 OT Time Calculation (min): 10 min  Charges: OT General Charges $OT Visit: 1 Visit OT Treatments $Self Care/Home Management : 8-22 mins  Larraine Colas M.S. OTR/L  09/23/23, 3:38 PM

## 2023-09-23 NOTE — ED Notes (Signed)
 Pt still has yet to receive a breakfast tray from dietary. Dietary states that they will re-enter their diet order for breakfast.

## 2023-09-23 NOTE — ED Notes (Signed)
 [facility] and spoke with david @4 :30pm for pt transport to [facility]. Will be here in about 2hrs.. pt room 25

## 2023-09-23 NOTE — ED Notes (Signed)
 Foley catheter is staying in per Dr Viviann

## 2023-09-23 NOTE — ED Notes (Signed)
Pt cleaned up after having a bowel movement

## 2023-10-05 ENCOUNTER — Emergency Department
Admission: EM | Admit: 2023-10-05 | Discharge: 2023-10-09 | Disposition: A | Discharge status: Home / Self Care | Attending: Emergency Medicine | Admitting: Emergency Medicine

## 2023-10-05 ENCOUNTER — Other Ambulatory Visit: Payer: Self-pay

## 2023-10-05 ENCOUNTER — Emergency Department

## 2023-10-05 DIAGNOSIS — M79605 Pain in left leg: Secondary | ICD-10-CM | POA: Diagnosis present

## 2023-10-05 DIAGNOSIS — W06XXXA Fall from bed, initial encounter: Secondary | ICD-10-CM | POA: Insufficient documentation

## 2023-10-05 LAB — CBC WITH DIFFERENTIAL/PLATELET
Abs Immature Granulocytes: 0.02 K/uL (ref 0.00–0.07)
Basophils Absolute: 0 K/uL (ref 0.0–0.1)
Basophils Relative: 0 %
Eosinophils Absolute: 0.1 K/uL (ref 0.0–0.5)
Eosinophils Relative: 1 %
HCT: 26.6 % — ABNORMAL LOW (ref 39.0–52.0)
Hemoglobin: 7.7 g/dL — ABNORMAL LOW (ref 13.0–17.0)
Immature Granulocytes: 0 %
Lymphocytes Relative: 21 %
Lymphs Abs: 1.2 K/uL (ref 0.7–4.0)
MCH: 21.2 pg — ABNORMAL LOW (ref 26.0–34.0)
MCHC: 28.9 g/dL — ABNORMAL LOW (ref 30.0–36.0)
MCV: 73.1 fL — ABNORMAL LOW (ref 80.0–100.0)
Monocytes Absolute: 0.3 K/uL (ref 0.1–1.0)
Monocytes Relative: 6 %
Neutro Abs: 4 K/uL (ref 1.7–7.7)
Neutrophils Relative %: 72 %
Platelets: 240 K/uL (ref 150–400)
RBC: 3.64 MIL/uL — ABNORMAL LOW (ref 4.22–5.81)
RDW: 17.8 % — ABNORMAL HIGH (ref 11.5–15.5)
WBC: 5.6 K/uL (ref 4.0–10.5)
nRBC: 0 % (ref 0.0–0.2)

## 2023-10-05 LAB — COMPREHENSIVE METABOLIC PANEL WITH GFR
ALT: 10 U/L (ref 0–44)
AST: 24 U/L (ref 15–41)
Albumin: 2.7 g/dL — ABNORMAL LOW (ref 3.5–5.0)
Alkaline Phosphatase: 113 U/L (ref 38–126)
Anion gap: 11 (ref 5–15)
BUN: 42 mg/dL — ABNORMAL HIGH (ref 6–20)
CO2: 27 mmol/L (ref 22–32)
Calcium: 8.6 mg/dL — ABNORMAL LOW (ref 8.9–10.3)
Chloride: 99 mmol/L (ref 98–111)
Creatinine, Ser: 1.41 mg/dL — ABNORMAL HIGH (ref 0.61–1.24)
GFR, Estimated: 57 mL/min — ABNORMAL LOW (ref >60)
Glucose, Bld: 162 mg/dL — ABNORMAL HIGH (ref 70–99)
Potassium: 4.6 mmol/L (ref 3.5–5.1)
Sodium: 137 mmol/L (ref 135–145)
Total Bilirubin: 0.3 mg/dL (ref 0.0–1.2)
Total Protein: 7.2 g/dL (ref 6.5–8.1)

## 2023-10-05 NOTE — ED Triage Notes (Signed)
 Pt arrives via [facility] from [facility] with C/O, a fall this morning (pt rolled out of bed) pain in the left leg at amputation site. Amputation about was 2 weeks ago. Pt would like to speak to a Child psychotherapist for placement. Pt states he fells unsafe at [facility].  115/83 62 98 %
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
## 2023-10-06 MED ORDER — POTASSIUM CHLORIDE CRYS ER 20 MEQ PO TBCR
10.0000 meq | EXTENDED_RELEASE_TABLET | Freq: Two times a day (BID) | ORAL | Status: DC
  Administered 2023-10-06 – 2023-10-09 (×8): 10 meq via ORAL
  Filled 2023-10-06 (×8): qty 1

## 2023-10-06 MED ORDER — FUROSEMIDE 40 MG PO TABS
80.0000 mg | ORAL_TABLET | Freq: Two times a day (BID) | ORAL | Status: DC
  Administered 2023-10-06 – 2023-10-09 (×8): 80 mg via ORAL
  Filled 2023-10-06 (×8): qty 2

## 2023-10-06 MED ORDER — BUPROPION HCL ER (XL) 150 MG PO TB24
150.0000 mg | ORAL_TABLET | Freq: Every day | ORAL | Status: DC
  Administered 2023-10-06 – 2023-10-09 (×4): 150 mg via ORAL
  Filled 2023-10-06 (×4): qty 1

## 2023-10-06 MED ORDER — PREGABALIN 75 MG PO CAPS
150.0000 mg | ORAL_CAPSULE | Freq: Three times a day (TID) | ORAL | Status: DC
Start: 2023-10-06 — End: 2023-10-09
  Administered 2023-10-06 – 2023-10-09 (×9): 150 mg via ORAL
  Filled 2023-10-06 (×10): qty 2

## 2023-10-06 MED ORDER — GABAPENTIN 600 MG PO TABS: 600.0000 mg | ORAL_TABLET | Freq: Three times a day (TID) | ORAL | Status: DC

## 2023-10-06 MED ORDER — ASPIRIN 81 MG PO TBEC
81.0000 mg | DELAYED_RELEASE_TABLET | Freq: Every day | ORAL | Status: DC
  Administered 2023-10-06 – 2023-10-09 (×4): 81 mg via ORAL
  Filled 2023-10-06 (×4): qty 1

## 2023-10-06 MED ORDER — POLYETHYLENE GLYCOL 3350 17 G PO PACK
17.0000 g | PACK | Freq: Every day | ORAL | Status: DC | PRN
  Filled 2023-10-06: qty 1

## 2023-10-06 MED ORDER — INSULIN ASPART 100 UNIT/ML IJ SOLN
10.0000 [IU] | Freq: Three times a day (TID) | INTRAMUSCULAR | Status: DC
  Administered 2023-10-06 – 2023-10-09 (×10): 10 [IU] via SUBCUTANEOUS
  Filled 2023-10-06 (×11): qty 10

## 2023-10-06 MED ORDER — DULOXETINE HCL 60 MG PO CPEP
90.0000 mg | ORAL_CAPSULE | Freq: Every day | ORAL | Status: DC
  Administered 2023-10-06 – 2023-10-08 (×3): 90 mg via ORAL
  Filled 2023-10-06 (×3): qty 1

## 2023-10-06 MED ORDER — INSULIN GLARGINE-YFGN 100 UNIT/ML ~~LOC~~ SOLN
20.0000 [IU] | Freq: Every day | SUBCUTANEOUS | Status: DC
  Administered 2023-10-06 – 2023-10-08 (×3): 20 [IU] via SUBCUTANEOUS
  Filled 2023-10-06 (×4): qty 0.2

## 2023-10-06 MED ORDER — ACETAMINOPHEN 325 MG PO TABS
650.0000 mg | ORAL_TABLET | Freq: Four times a day (QID) | ORAL | Status: DC | PRN
  Administered 2023-10-08: 650 mg via ORAL
  Filled 2023-10-06: qty 2

## 2023-10-06 MED ORDER — BACLOFEN 10 MG PO TABS
15.0000 mg | ORAL_TABLET | Freq: Three times a day (TID) | ORAL | Status: DC
  Administered 2023-10-06 – 2023-10-09 (×11): 15 mg via ORAL
  Filled 2023-10-06 (×12): qty 2

## 2023-10-06 MED ORDER — TAMSULOSIN HCL 0.4 MG PO CAPS
0.8000 mg | ORAL_CAPSULE | Freq: Every day | ORAL | Status: DC
  Administered 2023-10-06 – 2023-10-09 (×4): 0.8 mg via ORAL
  Filled 2023-10-06 (×4): qty 2

## 2023-10-06 MED ORDER — SENNA 8.6 MG PO TABS
2.0000 | ORAL_TABLET | Freq: Every day | ORAL | Status: DC
  Administered 2023-10-06 – 2023-10-09 (×4): 17.2 mg via ORAL
  Filled 2023-10-06 (×4): qty 2

## 2023-10-06 MED ORDER — ATORVASTATIN CALCIUM 20 MG PO TABS
40.0000 mg | ORAL_TABLET | Freq: Every day | ORAL | Status: DC
  Administered 2023-10-06 – 2023-10-09 (×4): 40 mg via ORAL
  Filled 2023-10-06 (×4): qty 2

## 2023-10-06 MED ORDER — FINASTERIDE 5 MG PO TABS
5.0000 mg | ORAL_TABLET | Freq: Every day | ORAL | Status: DC
  Administered 2023-10-06 – 2023-10-09 (×4): 5 mg via ORAL
  Filled 2023-10-06 (×5): qty 1

## 2023-10-06 MED ORDER — MELATONIN 5 MG PO TABS
5.0000 mg | ORAL_TABLET | Freq: Every day | ORAL | Status: DC
  Administered 2023-10-06 – 2023-10-08 (×3): 5 mg via ORAL
  Filled 2023-10-06 (×3): qty 1

## 2023-10-06 MED ORDER — OXYCODONE HCL 5 MG PO TABS
15.0000 mg | ORAL_TABLET | Freq: Four times a day (QID) | ORAL | Status: DC | PRN
  Administered 2023-10-06 – 2023-10-09 (×10): 15 mg via ORAL
  Filled 2023-10-06 (×10): qty 3

## 2023-10-06 MED ORDER — METOPROLOL SUCCINATE ER 50 MG PO TB24
100.0000 mg | ORAL_TABLET | Freq: Every day | ORAL | Status: DC
  Administered 2023-10-06 – 2023-10-08 (×3): 100 mg via ORAL
  Filled 2023-10-06 (×4): qty 2

## 2023-10-06 NOTE — ED Provider Notes (Signed)
-----------------------------------------   5:01 AM on 10/06/2023 -----------------------------------------   Blood pressure (!) 101/48, pulse (!) 57, temperature 98.3 F (36.8 C), temperature source Oral, resp. rate 20, height 5' 11 (1.803 m), weight (!) 148.8 kg, SpO2 94%.  The patient is calm and cooperative at this time.  There have been no acute events since the last update.  Awaiting disposition plan from case management/social work.  Spoke to [employer] by phone and confirmed, reordered all of his home medications.   Maurissa Ambrose, Josette SAILOR, DO 10/06/23 [phone]

## 2023-10-06 NOTE — TOC CM/SW Note (Addendum)
..  Transition of Care [facility]) - Inpatient Brief Assessment   Patient Details  Name: Russell [name] MRN: [id number] Date of Birth: [birth year]  Transition of Care [facility]) CM/SW Contact:    Russell DELENA Fischer, LCSW Phone Number: 10/06/2023, 9:49 AM   Clinical Narrative:  9:49am- sw review pt chart. SW submitted fl2 to new SNF for rehab.  Awaiting response  11:59- Approved PlanAuthID: [id number] Dates:8/9-8/12/2023 Next Review Date: 10/10/2023   Transition of Care Asessment:

## 2023-10-06 NOTE — TOC CM/SW Note (Signed)
..  Transition of Care [facility]) - Inpatient Brief Assessment   Patient Details  Name: NOE GOYER MRN: [id number] Date of Birth: 1966-02-28  Transition of Care [facility]) CM/SW Contact:    [name], LCSW Phone Number: 10/06/2023, 5:41 PM   Clinical Narrative:  SW met with pt at bedside.  SW expressed to pt that he was accepted to [facility] since pt did not want to return to [facility]. Pt stated that he didn't believe that facility had enough staff, they missed up on his medication 3x and didn't use hoyer lift to get him back on the bed after he fell off.  Pt stated that is okay with going to that facility.  Pt asked about [facility] and [facility]. SW expressed that they offer outpatient rehab and pt requested inpatient.  SW contacted [employer] and left message for Darice.  SW will follow up with staff regarding possible discharge tomorrow.   Transition of Care Asessment:

## 2023-10-07 LAB — CBG MONITORING, ED: Glucose-Capillary: 260 mg/dL — ABNORMAL HIGH (ref 70–99)

## 2023-10-07 NOTE — ED Notes (Signed)
 toc
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
## 2023-10-07 NOTE — ED Provider Notes (Signed)
-----------------------------------------   9:57 AM on 10/07/2023 -----------------------------------------   Blood pressure (!) 151/61, pulse (!) 58, temperature (!) 97.4 F (36.3 C), temperature source Oral, resp. rate 18, height 5' 11 (1.803 m), weight (!) 148.8 kg, SpO2 98%.  The patient is calm and cooperative at this time.  There have been no acute events since the last update.  Awaiting disposition plan from case management/social work.    Story Conti, Josette SAILOR, DO 10/07/23 [phone]

## 2023-10-07 NOTE — ED Notes (Addendum)
 RN to bedside to administer medications to pt. Pty began yelling at this RN that he has not seen a doctor. This RN explained to him that he has seen the EDP and he is medically cleared for d/c. Pt states he has concerns that his urine has not been checked. RN assessed for any urinary complaints. RN asked pt if he had any burning or pain to which he responded with no. MD made aware. Pt educated that he has been seen by EDP and showed him notes of the encounter. Pt continues to state that no one has checked his catheter and that he is constipated. Pt educated that these issues will be addressed out patient because the MD has medically cleared him for d/c. Pt begins cursing and yelling increasing at this RN. Mel RN enters room at this time and continues to try to verbally de escalate pt. Pt continues yelling at Rns. RN states this is not productive and states that we will be back.

## 2023-10-07 NOTE — ED Notes (Signed)
 MD at bedside. Pt continues to state that he needs to be seen by a MD. Pt arguing with MD. Pt continues to state that he has not used the bathroom in 3 days and has a catheter. Pt interrupting MD. MD explaining to pt about placement. Pt continues to interrupt md. MD explains that these issues will not be addressed in the ED due to these issues will be addressed outpatient. Pt threatening MD that he will call insurance company and report them for refusing to treat them. Pt continues to state that you ain't taking me no where. MD requesting that pt not interrupt him while speaking. MD continues to educate pt on how the emergency department works. Pt slamming hand on table and threatening and cursing at MD. Pt states let's see you do it. To MD after MD states that we can get you home. Pt requesting to speak to administrator. Pt educated that MD is Wellsite geologist of ED. Pt states you are not a Wellsite geologist. Pt continues to curse at ED staff.
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
## 2023-10-07 NOTE — Discharge Instructions (Addendum)
 Transportation services [employer] will be the transportation benefit provider for [employer] [place]  members.  To schedule a ride, members can call [phone], 2 days before their appointment.  [email or web address]

## 2023-10-07 NOTE — ED Notes (Signed)
 Pt concerned about prostate exam and requesting to see MD. Pt educated that MD has already assessed pt and he is ready for d/c once placement is found. Pt stating he does not know if he wants to go to [place] facility and requesting to speak to SW. MD made aware of pt requesting to speak to physician. RN explained to pt that sutures and prostate exam will not be performed by EDP and that he is medically cleared for d/c.

## 2023-10-08 LAB — CBG MONITORING, ED: Glucose-Capillary: 267 mg/dL — ABNORMAL HIGH (ref 70–99)

## 2023-10-08 NOTE — ED Notes (Signed)
 Pt rolled and cleaned at this time with this RN and Katrinka,  RN after pt reporting having a bowel movement in his adult brief and in need of changing.

## 2023-10-08 NOTE — TOC Progression Note (Signed)
 Transition of Care [facility]) - Progression Note    Patient Details  Name: KNOX CERVI MRN: [id number] Date of Birth: 01-10-1966  Transition of Care [facility]) CM/SW Contact  [place] L Kavya Haag, [place] Phone Number: 10/08/2023, 5:08 PM  Clinical Narrative:      CSW met with patient. No family at bedside. Patient was visibly pleasant today. Patient advised that he will accept the bed at [facility]. He acknowledged that he has needs and that he needs help.   Shara will be started. Bed has been accepted in [facility].                    Expected Discharge Plan and Services                                               Social Drivers of Health (SDOH) Interventions SDOH Screenings   Food Insecurity: Unknown (09/07/2023)  Housing: Low Risk  (09/07/2023)  Transportation Needs: No Transportation Needs (09/07/2023)  Utilities: Not At Risk (09/07/2023)  Financial Resource Strain: Low Risk  (04/04/2023)   Received from [facility]  Tobacco Use: Low Risk  (10/05/2023)    Readmission Risk Interventions    03/03/2023   12:27 PM 10/25/2022    3:55 PM  Readmission Risk Prevention Plan  Transportation Screening Complete Complete  PCP or Specialist Appt within 3-5 Days  Complete  HRI or Home Care Consult  Complete  Social Work Consult for Recovery Care Planning/Counseling  Complete  Palliative Care Screening  Not Applicable  Medication Review [employer]) Complete Complete  PCP or Specialist appointment within 3-5 days of discharge Complete   HRI or Home Care Consult Complete   Palliative Care Screening Not Applicable   Skilled Nursing Facility Not Applicable

## 2023-10-08 NOTE — ED Notes (Signed)
 RN taking over care for pt at this time. Pt Pt ABCs intact. RR even and unlabored. Pt in NAD.   Past Medical History:  Diagnosis Date   Anxiety    CHF (congestive heart failure) (HCC)    EF 30-35%   Chronic diastolic (congestive) heart failure (HCC)    Depression    Diabetes mellitus without complication (HCC)    Hyperlipemia    Hypertension    Morbid obesity (HCC)    Peripheral edema

## 2023-10-08 NOTE — ED Notes (Signed)
 Pt resting comfortably in bed. Pt ABCs intact. RR even and unlabored. Pt in NAD. Call bell in reach. Denies needs at this time.

## 2023-10-08 NOTE — ED Notes (Signed)
 This NT and Bailey NT changed pts brief. Pt is now resting comfortably in bed.

## 2023-10-08 NOTE — ED Notes (Signed)
 This NT and RN Suzen emptied pts catheter bag as it was full.

## 2023-10-08 NOTE — ED Notes (Signed)
Pt given snack and diet coke.

## 2023-10-08 NOTE — ED Notes (Signed)
 Pt reporting phantom pain in his L toe. Pt requesting something for pain. ABCs intact. RR even and unlabored. Pt in NAD.  Denies further needs at this time.

## 2023-10-08 NOTE — ED Notes (Signed)
 Introduced self to pt and plugged up bed so pt could adjust the bed positions.

## 2023-10-08 NOTE — ED Provider Notes (Signed)
-----------------------------------------   1:44 PM on 10/08/2023 ----------------------------------------- I have seen and evaluated the patient in the emergency department.  Patient is much more pleasant currently.  States he apologized for yelling yesterday but was in a lot of pain.  Patient states after reconsidering he would now like to go to [place] skilled nursing facility.  I have communicated this with the social worker who will work on placement.   Dorothyann Drivers, MD 10/08/23 1344

## 2023-10-09 LAB — CBG MONITORING, ED
Glucose-Capillary: 143 mg/dL — ABNORMAL HIGH (ref 70–99)
Glucose-Capillary: 227 mg/dL — ABNORMAL HIGH (ref 70–99)

## 2023-10-09 MED ORDER — OXYCODONE HCL 15 MG PO TABS: 15.0000 mg | ORAL_TABLET | Freq: Four times a day (QID) | ORAL | 0 refills | Status: DC | PRN

## 2023-10-09 NOTE — ED Provider Notes (Signed)
-----------------------------------------   10:28 AM on 10/09/2023 ----------------------------------------- Social work was able to arrange acceptance to [place] skilled nursing facility today.  Patient will be discharged once transportation arrives.   Dorothyann Drivers, MD 10/09/23 1028

## 2023-10-09 NOTE — ED Provider Notes (Signed)
-----------------------------------------   10:58 AM on 10/09/2023 -----------------------------------------  Emergency department care summary.  Patient first checked into the emergency department 10/05/2023.  Patient has a past medical history of 3 extremity amputations, he left his skilled nursing facility due to a fall that morning and states he is not getting adequate care at that facility and was unwilling to return to that facility.  Patient had labs showing essentially reassuring results as follows.  Labs: No concerning findings on comprehensive metabolic panel Reassuring CBC showing chronic anemia unchanged from historical values  Imaging: X-ray of the left femur showing no acute findings.  Patient was seen by social work, ultimately was able to obtain placement at a skilled nursing facility and [place].  Patient's home medications were continued including pain medication.  A prescription for the patient controlled substances have been provided.  There are no acute medical concerns and the patient is medically cleared for discharge to his care facility at this time.     Dorothyann Drivers, MD 10/09/23 1102

## 2023-10-09 NOTE — ED Notes (Signed)
 [facility] will pick patient up at 3:30

## 2023-10-09 NOTE — ED Notes (Signed)
 Pt had a BM so pt's brief was changed and pt repositioned in the bed. No other needs at this time.

## 2023-10-09 NOTE — TOC Progression Note (Addendum)
 Transition of Care [facility]) - Progression Note    Patient Details  Name: RICHMOND COLDREN MRN: [id number] Date of Birth: 03/02/1965  Transition of Care [facility]) CM/SW Contact  [place] L Vici Novick, [place] Phone Number: 10/09/2023, 9:33 AM  Clinical Narrative:     CSW spoke with Isaiah, Admissions Coordinator, with [facility]. CSW advised that patient is now advising that he will discharge to the facility. Patient's room number is #503-B  CSW will notify medical team and arrange transportation. Patient moved to the hallway. CSW spoke with Mr. Minder and advised him that he is able to transport today. Patient is stilling willing to go.                     Expected Discharge Plan and Services                                               Social Drivers of Health (SDOH) Interventions SDOH Screenings   Food Insecurity: Unknown (09/07/2023)  Housing: Low Risk  (09/07/2023)  Transportation Needs: No Transportation Needs (09/07/2023)  Utilities: Not At Risk (09/07/2023)  Financial Resource Strain: Low Risk  (04/04/2023)   Received from [facility]  Tobacco Use: Low Risk  (10/05/2023)    Readmission Risk Interventions    03/03/2023   12:27 PM 10/25/2022    3:55 PM  Readmission Risk Prevention Plan  Transportation Screening Complete Complete  PCP or Specialist Appt within 3-5 Days  Complete  HRI or Home Care Consult  Complete  Social Work Consult for Recovery Care Planning/Counseling  Complete  Palliative Care Screening  Not Applicable  Medication Review Oceanographer) Complete Complete  PCP or Specialist appointment within 3-5 days of discharge Complete   HRI or Home Care Consult Complete   Palliative Care Screening Not Applicable   Skilled Nursing Facility Not Applicable

## 2023-10-11 ENCOUNTER — Ambulatory Visit (INDEPENDENT_AMBULATORY_CARE_PROVIDER_SITE_OTHER): Admitting: Nurse Practitioner

## 2023-10-17 ENCOUNTER — Ambulatory Visit (INDEPENDENT_AMBULATORY_CARE_PROVIDER_SITE_OTHER): Admitting: Nurse Practitioner

## 2023-10-21 ENCOUNTER — Telehealth (INDEPENDENT_AMBULATORY_CARE_PROVIDER_SITE_OTHER): Payer: Self-pay

## 2023-10-21 NOTE — Telephone Encounter (Signed)
 While I can provide an order for suture/staple removal, if he has any wound issues he will have to be assessed in office.  We will not give wound care orders without evaluating the wound.  In addition, if he wants a prosthetic, he will need to be seen so that we can send the order.

## 2023-10-21 NOTE — Telephone Encounter (Signed)
 The suture/staple removal order was faxed over with the recommendations from Orvin Daring, NP.

## 2023-10-21 NOTE — Telephone Encounter (Signed)
 A message was left from [facility] to have an order to remove the patient's sutures and staples from his left leg amputation. The patient states he refuses to come back to our office. Please advise.

## 2023-10-25 ENCOUNTER — Ambulatory Visit (INDEPENDENT_AMBULATORY_CARE_PROVIDER_SITE_OTHER): Admitting: Nurse Practitioner

## 2024-01-26 ENCOUNTER — Emergency Department
Admission: EM | Admit: 2024-01-26 | Discharge: 2024-01-26 | Disposition: A | Discharge status: Home / Self Care | Attending: Emergency Medicine | Admitting: Emergency Medicine

## 2024-01-26 ENCOUNTER — Other Ambulatory Visit: Payer: Self-pay

## 2024-01-26 DIAGNOSIS — S61350A Open bite of right index finger with damage to nail, initial encounter: Secondary | ICD-10-CM | POA: Diagnosis not present

## 2024-01-26 DIAGNOSIS — R339 Retention of urine, unspecified: Secondary | ICD-10-CM | POA: Diagnosis not present

## 2024-01-26 DIAGNOSIS — E1122 Type 2 diabetes mellitus with diabetic chronic kidney disease: Secondary | ICD-10-CM | POA: Insufficient documentation

## 2024-01-26 DIAGNOSIS — E1165 Type 2 diabetes mellitus with hyperglycemia: Secondary | ICD-10-CM | POA: Insufficient documentation

## 2024-01-26 DIAGNOSIS — S6991XA Unspecified injury of right wrist, hand and finger(s), initial encounter: Secondary | ICD-10-CM | POA: Diagnosis present

## 2024-01-26 DIAGNOSIS — M79609 Pain in unspecified limb: Secondary | ICD-10-CM

## 2024-01-26 DIAGNOSIS — N189 Chronic kidney disease, unspecified: Secondary | ICD-10-CM | POA: Insufficient documentation

## 2024-01-26 DIAGNOSIS — R739 Hyperglycemia, unspecified: Secondary | ICD-10-CM

## 2024-01-26 DIAGNOSIS — S61459A Open bite of unspecified hand, initial encounter: Secondary | ICD-10-CM

## 2024-01-26 DIAGNOSIS — X58XXXA Exposure to other specified factors, initial encounter: Secondary | ICD-10-CM | POA: Diagnosis not present

## 2024-01-26 LAB — COMPREHENSIVE METABOLIC PANEL WITH GFR
ALT: 13 U/L (ref 0–44)
AST: 16 U/L (ref 15–41)
Albumin: 3.5 g/dL (ref 3.5–5.0)
Alkaline Phosphatase: 169 U/L — ABNORMAL HIGH (ref 38–126)
Anion gap: 10 (ref 5–15)
BUN: 16 mg/dL (ref 6–20)
CO2: 25 mmol/L (ref 22–32)
Calcium: 9.2 mg/dL (ref 8.9–10.3)
Chloride: 98 mmol/L (ref 98–111)
Creatinine, Ser: 0.72 mg/dL (ref 0.61–1.24)
GFR, Estimated: >60 mL/min (ref >60)
Glucose, Bld: 463 mg/dL — ABNORMAL HIGH (ref 70–99)
Potassium: 4.4 mmol/L (ref 3.5–5.1)
Sodium: 132 mmol/L — ABNORMAL LOW (ref 135–145)
Total Bilirubin: 0.4 mg/dL (ref 0.0–1.2)
Total Protein: 7.4 g/dL (ref 6.5–8.1)

## 2024-01-26 LAB — CBC WITH DIFFERENTIAL/PLATELET
Abs Immature Granulocytes: 0.03 K/uL (ref 0.00–0.07)
Basophils Absolute: 0 K/uL (ref 0.0–0.1)
Basophils Relative: 0 %
Eosinophils Absolute: 0.1 K/uL (ref 0.0–0.5)
Eosinophils Relative: 1 %
HCT: 36.7 % — ABNORMAL LOW (ref 39.0–52.0)
Hemoglobin: 11.3 g/dL — ABNORMAL LOW (ref 13.0–17.0)
Immature Granulocytes: 0 %
Lymphocytes Relative: 15 %
Lymphs Abs: 1.2 K/uL (ref 0.7–4.0)
MCH: 20.8 pg — ABNORMAL LOW (ref 26.0–34.0)
MCHC: 30.8 g/dL (ref 30.0–36.0)
MCV: 67.5 fL — ABNORMAL LOW (ref 80.0–100.0)
Monocytes Absolute: 0.3 K/uL (ref 0.1–1.0)
Monocytes Relative: 4 %
Neutro Abs: 6.3 K/uL (ref 1.7–7.7)
Neutrophils Relative %: 80 %
Platelets: 230 K/uL (ref 150–400)
RBC: 5.44 MIL/uL (ref 4.22–5.81)
RDW: 19.4 % — ABNORMAL HIGH (ref 11.5–15.5)
WBC: 8 K/uL (ref 4.0–10.5)
nRBC: 0 % (ref 0.0–0.2)

## 2024-01-26 LAB — URINALYSIS, W/ REFLEX TO CULTURE (INFECTION SUSPECTED)
Bacteria, UA: NONE SEEN
Bilirubin Urine: NEGATIVE
Glucose, UA: >=500 mg/dL — AB
Ketones, ur: 5 mg/dL — AB
Leukocytes,Ua: NEGATIVE
Nitrite: NEGATIVE
Protein, ur: 100 mg/dL — AB
Specific Gravity, Urine: 1.027 (ref 1.005–1.030)
pH: 5 (ref 5.0–8.0)

## 2024-01-26 LAB — CBG MONITORING, ED
Glucose-Capillary: 394 mg/dL — ABNORMAL HIGH (ref 70–99)
Glucose-Capillary: 431 mg/dL — ABNORMAL HIGH (ref 70–99)

## 2024-01-26 MED ORDER — AMOXICILLIN-POT CLAVULANATE 875-125 MG PO TABS: 1.0000 | ORAL_TABLET | Freq: Two times a day (BID) | ORAL | 0 refills | Status: DC

## 2024-01-26 MED ORDER — AMOXICILLIN-POT CLAVULANATE 875-125 MG PO TABS
1.0000 | ORAL_TABLET | Freq: Once | ORAL | Status: AC
  Administered 2024-01-26: 1 via ORAL
  Filled 2024-01-26: qty 1

## 2024-01-26 MED ORDER — INSULIN ASPART 100 UNIT/ML IJ SOLN
5.0000 [IU] | Freq: Once | INTRAMUSCULAR | Status: AC
  Administered 2024-01-26: 5 [IU] via INTRAVENOUS
  Filled 2024-01-26: qty 5

## 2024-01-26 MED ORDER — SODIUM CHLORIDE 0.9 % IV BOLUS
1000.0000 mL | Freq: Once | INTRAVENOUS | Status: AC
  Administered 2024-01-26: 1000 mL via INTRAVENOUS

## 2024-01-26 MED ORDER — OXYCODONE HCL 5 MG PO TABS
10.0000 mg | ORAL_TABLET | Freq: Once | ORAL | Status: AC
Start: 2024-01-26 — End: 2024-01-26
  Administered 2024-01-26: 10 mg via ORAL
  Filled 2024-01-26: qty 2
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
## 2024-01-26 NOTE — Discharge Instructions (Addendum)
 You were seen in the emergency department today for evaluation of your limb pain and inability to urinate.  We placed a Foley catheter in you.  Please follow-up with a urologist for further evaluation of your inability to pee.  Please ensure the catheter does not stay in place for more than 3 weeks.  Your blood sugar was high, please make sure you are taking your diabetes medications as directed.  Continue to follow-up with pain management for further evaluation of your extremity pain.  I have sent a prescription for antibiotics for your hand wound.  Please return to the ER if those show signs of a developing infection or if you develop any other new or worsening symptoms.

## 2024-01-26 NOTE — ED Triage Notes (Signed)
 Pt coming from home via EMS. Pt family called EMS bc pt c/o not feeling good and feeling pain all around body. EMS states pt has been crying since they arrived saying he is hurting. Pt reports pain began last night. Pt has Left ARM BEA, Right leg BKA, Left leg AKA.

## 2024-01-27 ENCOUNTER — Emergency Department
Admission: EM | Admit: 2024-01-27 | Discharge: 2024-01-27 | Disposition: A | Discharge status: Home / Self Care | Source: Home / Self Care | Attending: Emergency Medicine | Admitting: Emergency Medicine

## 2024-01-27 DIAGNOSIS — E871 Hypo-osmolality and hyponatremia: Secondary | ICD-10-CM | POA: Insufficient documentation

## 2024-01-27 DIAGNOSIS — R739 Hyperglycemia, unspecified: Secondary | ICD-10-CM

## 2024-01-27 DIAGNOSIS — E1165 Type 2 diabetes mellitus with hyperglycemia: Secondary | ICD-10-CM | POA: Insufficient documentation

## 2024-01-27 LAB — COMPREHENSIVE METABOLIC PANEL WITH GFR
ALT: 12 U/L (ref 0–44)
AST: 18 U/L (ref 15–41)
Albumin: 3.6 g/dL (ref 3.5–5.0)
Alkaline Phosphatase: 151 U/L — ABNORMAL HIGH (ref 38–126)
Anion gap: 12 (ref 5–15)
BUN: 16 mg/dL (ref 6–20)
CO2: 23 mmol/L (ref 22–32)
Calcium: 9 mg/dL (ref 8.9–10.3)
Chloride: 97 mmol/L — ABNORMAL LOW (ref 98–111)
Creatinine, Ser: 0.78 mg/dL (ref 0.61–1.24)
GFR, Estimated: >60 mL/min (ref >60)
Glucose, Bld: 381 mg/dL — ABNORMAL HIGH (ref 70–99)
Potassium: 4.3 mmol/L (ref 3.5–5.1)
Sodium: 132 mmol/L — ABNORMAL LOW (ref 135–145)
Total Bilirubin: 0.3 mg/dL (ref 0.0–1.2)
Total Protein: 7.3 g/dL (ref 6.5–8.1)

## 2024-01-27 LAB — CBC WITH DIFFERENTIAL/PLATELET
Abs Immature Granulocytes: 0.03 K/uL (ref 0.00–0.07)
Basophils Absolute: 0 K/uL (ref 0.0–0.1)
Basophils Relative: 0 %
Eosinophils Absolute: 0.1 K/uL (ref 0.0–0.5)
Eosinophils Relative: 1 %
HCT: 36.2 % — ABNORMAL LOW (ref 39.0–52.0)
Hemoglobin: 11.3 g/dL — ABNORMAL LOW (ref 13.0–17.0)
Immature Granulocytes: 0 %
Lymphocytes Relative: 16 %
Lymphs Abs: 1.4 K/uL (ref 0.7–4.0)
MCH: 21 pg — ABNORMAL LOW (ref 26.0–34.0)
MCHC: 31.2 g/dL (ref 30.0–36.0)
MCV: 67.4 fL — ABNORMAL LOW (ref 80.0–100.0)
Monocytes Absolute: 0.4 K/uL (ref 0.1–1.0)
Monocytes Relative: 5 %
Neutro Abs: 6.4 K/uL (ref 1.7–7.7)
Neutrophils Relative %: 78 %
Platelets: 235 K/uL (ref 150–400)
RBC: 5.37 MIL/uL (ref 4.22–5.81)
RDW: 19.8 % — ABNORMAL HIGH (ref 11.5–15.5)
Smear Review: NORMAL
WBC: 8.4 K/uL (ref 4.0–10.5)
nRBC: 0 % (ref 0.0–0.2)

## 2024-01-27 LAB — BLOOD GAS, VENOUS
Acid-Base Excess: 3.4 mmol/L — ABNORMAL HIGH (ref 0.0–2.0)
Bicarbonate: 28.5 mmol/L — ABNORMAL HIGH (ref 20.0–28.0)
O2 Saturation: 90.5 %
Patient temperature: 37
pCO2, Ven: 44 mmHg (ref 44–60)
pH, Ven: 7.42 (ref 7.25–7.43)
pO2, Ven: 59 mmHg — ABNORMAL HIGH (ref 32–45)

## 2024-01-27 LAB — CBG MONITORING, ED: Glucose-Capillary: 294 mg/dL — ABNORMAL HIGH (ref 70–99)

## 2024-01-27 LAB — LACTIC ACID, PLASMA: Lactic Acid, Venous: 1.1 mmol/L (ref 0.5–1.9)

## 2024-01-27 MED ORDER — LACTATED RINGERS IV BOLUS
1000.0000 mL | Freq: Once | INTRAVENOUS | Status: AC
  Administered 2024-01-27: 1000 mL via INTRAVENOUS

## 2024-01-27 MED ORDER — FENTANYL CITRATE (PF) 50 MCG/ML IJ SOSY
50.0000 ug | PREFILLED_SYRINGE | Freq: Once | INTRAMUSCULAR | Status: AC
  Administered 2024-01-27: 50 ug via INTRAVENOUS
  Filled 2024-01-27: qty 1
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
## 2024-01-27 NOTE — ED Triage Notes (Signed)
 Pt presents to the ED via [facility] from home with hyperglycemia. Pt's CBG was 514. Pt has been out of test strips for his glucometer and has been unable to take his blood sugar. Pt has a hx of diabetes. Pt was seen here yesterday. Started antibiotics today for infection in hir right hand  CBG 514  126/75 HR 77 100% RA

## 2024-01-30 ENCOUNTER — Inpatient Hospital Stay
Admission: EM | Admit: 2024-01-30 | Discharge: 2024-02-09 | DRG: 377 | Disposition: A | Discharge status: Skilled Nursing Facility | Attending: Hospitalist | Admitting: Hospitalist

## 2024-01-30 ENCOUNTER — Other Ambulatory Visit: Payer: Self-pay

## 2024-01-30 DIAGNOSIS — Z89212 Acquired absence of left upper limb below elbow: Secondary | ICD-10-CM | POA: Diagnosis not present

## 2024-01-30 DIAGNOSIS — F319 Bipolar disorder, unspecified: Secondary | ICD-10-CM | POA: Insufficient documentation

## 2024-01-30 DIAGNOSIS — M86141 Other acute osteomyelitis, right hand: Secondary | ICD-10-CM | POA: Diagnosis not present

## 2024-01-30 DIAGNOSIS — Z794 Long term (current) use of insulin: Secondary | ICD-10-CM | POA: Diagnosis not present

## 2024-01-30 DIAGNOSIS — K922 Gastrointestinal hemorrhage, unspecified: Principal | ICD-10-CM | POA: Diagnosis present

## 2024-01-30 DIAGNOSIS — Z7982 Long term (current) use of aspirin: Secondary | ICD-10-CM | POA: Diagnosis not present

## 2024-01-30 DIAGNOSIS — M549 Dorsalgia, unspecified: Secondary | ICD-10-CM | POA: Diagnosis not present

## 2024-01-30 DIAGNOSIS — Z89612 Acquired absence of left leg above knee: Secondary | ICD-10-CM | POA: Diagnosis not present

## 2024-01-30 DIAGNOSIS — K264 Chronic or unspecified duodenal ulcer with hemorrhage: Secondary | ICD-10-CM

## 2024-01-30 DIAGNOSIS — Z89511 Acquired absence of right leg below knee: Secondary | ICD-10-CM | POA: Diagnosis not present

## 2024-01-30 DIAGNOSIS — K921 Melena: Secondary | ICD-10-CM

## 2024-01-30 DIAGNOSIS — G8929 Other chronic pain: Secondary | ICD-10-CM | POA: Diagnosis not present

## 2024-01-30 DIAGNOSIS — B957 Other staphylococcus as the cause of diseases classified elsewhere: Secondary | ICD-10-CM | POA: Diagnosis not present

## 2024-01-30 DIAGNOSIS — D62 Acute posthemorrhagic anemia: Secondary | ICD-10-CM | POA: Diagnosis present

## 2024-01-30 DIAGNOSIS — R739 Hyperglycemia, unspecified: Secondary | ICD-10-CM

## 2024-01-30 DIAGNOSIS — D500 Iron deficiency anemia secondary to blood loss (chronic): Secondary | ICD-10-CM

## 2024-01-30 DIAGNOSIS — K269 Duodenal ulcer, unspecified as acute or chronic, without hemorrhage or perforation: Secondary | ICD-10-CM

## 2024-01-30 DIAGNOSIS — M533 Sacrococcygeal disorders, not elsewhere classified: Secondary | ICD-10-CM | POA: Diagnosis not present

## 2024-01-30 DIAGNOSIS — Z79899 Other long term (current) drug therapy: Secondary | ICD-10-CM | POA: Diagnosis not present

## 2024-01-30 DIAGNOSIS — M869 Osteomyelitis, unspecified: Secondary | ICD-10-CM

## 2024-01-30 LAB — PROTIME-INR
INR: 1.1 (ref 0.8–1.2)
Prothrombin Time: 15 s (ref 11.4–15.2)

## 2024-01-30 LAB — CBC WITH DIFFERENTIAL/PLATELET
Abs Immature Granulocytes: 0.07 K/uL (ref 0.00–0.07)
Basophils Absolute: 0 K/uL (ref 0.0–0.1)
Basophils Relative: 0 %
Eosinophils Absolute: 0.1 K/uL (ref 0.0–0.5)
Eosinophils Relative: 2 %
HCT: 19.2 % — ABNORMAL LOW (ref 39.0–52.0)
Hemoglobin: 6 g/dL — ABNORMAL LOW (ref 13.0–17.0)
Immature Granulocytes: 1 %
Lymphocytes Relative: 28 %
Lymphs Abs: 2.2 K/uL (ref 0.7–4.0)
MCH: 21.2 pg — ABNORMAL LOW (ref 26.0–34.0)
MCHC: 31.3 g/dL (ref 30.0–36.0)
MCV: 67.8 fL — ABNORMAL LOW (ref 80.0–100.0)
Monocytes Absolute: 0.4 K/uL (ref 0.1–1.0)
Monocytes Relative: 6 %
Neutro Abs: 5.1 K/uL (ref 1.7–7.7)
Neutrophils Relative %: 63 %
Platelets: 209 K/uL (ref 150–400)
RBC: 2.83 MIL/uL — ABNORMAL LOW (ref 4.22–5.81)
RDW: 19.4 % — ABNORMAL HIGH (ref 11.5–15.5)
WBC: 8 K/uL (ref 4.0–10.5)
nRBC: 0 % (ref 0.0–0.2)

## 2024-01-30 LAB — RETICULOCYTES
Immature Retic Fract: 29.5 % — ABNORMAL HIGH (ref 2.3–15.9)
RBC.: 2.8 MIL/uL — ABNORMAL LOW (ref 4.22–5.81)
Retic Count, Absolute: 64.7 K/uL (ref 19.0–186.0)
Retic Ct Pct: 2.3 % (ref 0.4–3.1)

## 2024-01-30 LAB — URINALYSIS, ROUTINE W REFLEX MICROSCOPIC
Bacteria, UA: NONE SEEN
Bilirubin Urine: NEGATIVE
Glucose, UA: >=500 mg/dL — AB
Hgb urine dipstick: NEGATIVE
Ketones, ur: NEGATIVE mg/dL
Leukocytes,Ua: NEGATIVE
Nitrite: NEGATIVE
Protein, ur: NEGATIVE mg/dL
RBC / HPF: 0 RBC/hpf (ref 0–5)
Specific Gravity, Urine: 1.016 (ref 1.005–1.030)
pH: 5 (ref 5.0–8.0)

## 2024-01-30 LAB — HEMOGLOBIN AND HEMATOCRIT, BLOOD
HCT: 19.1 % — ABNORMAL LOW (ref 39.0–52.0)
Hemoglobin: 6.1 g/dL — ABNORMAL LOW (ref 13.0–17.0)

## 2024-01-30 LAB — CBC
HCT: 18.7 % — ABNORMAL LOW (ref 39.0–52.0)
Hemoglobin: 5.9 g/dL — ABNORMAL LOW (ref 13.0–17.0)
MCH: 22.7 pg — ABNORMAL LOW (ref 26.0–34.0)
MCHC: 31.6 g/dL (ref 30.0–36.0)
MCV: 71.9 fL — ABNORMAL LOW (ref 80.0–100.0)
Platelets: 223 K/uL (ref 150–400)
RBC: 2.6 MIL/uL — ABNORMAL LOW (ref 4.22–5.81)
RDW: 21.4 % — ABNORMAL HIGH (ref 11.5–15.5)
WBC: 9.3 K/uL (ref 4.0–10.5)
nRBC: 0 % (ref 0.0–0.2)

## 2024-01-30 LAB — BASIC METABOLIC PANEL WITH GFR
Anion gap: 11 (ref 5–15)
Anion gap: 10 (ref 5–15)
BUN: 62 mg/dL — ABNORMAL HIGH (ref 6–20)
BUN: 59 mg/dL — ABNORMAL HIGH (ref 6–20)
CO2: 20 mmol/L — ABNORMAL LOW (ref 22–32)
CO2: 21 mmol/L — ABNORMAL LOW (ref 22–32)
Calcium: 8.3 mg/dL — ABNORMAL LOW (ref 8.9–10.3)
Calcium: 8.1 mg/dL — ABNORMAL LOW (ref 8.9–10.3)
Chloride: 96 mmol/L — ABNORMAL LOW (ref 98–111)
Chloride: 101 mmol/L (ref 98–111)
Creatinine, Ser: 1.14 mg/dL (ref 0.61–1.24)
Creatinine, Ser: 1.02 mg/dL (ref 0.61–1.24)
GFR, Estimated: >60 mL/min (ref >60)
GFR, Estimated: >60 mL/min (ref >60)
Glucose, Bld: 613 mg/dL (ref 70–99)
Glucose, Bld: 446 mg/dL — ABNORMAL HIGH (ref 70–99)
Potassium: 4.6 mmol/L (ref 3.5–5.1)
Potassium: 4.7 mmol/L (ref 3.5–5.1)
Sodium: 127 mmol/L — ABNORMAL LOW (ref 135–145)
Sodium: 132 mmol/L — ABNORMAL LOW (ref 135–145)

## 2024-01-30 LAB — IRON AND TIBC
Iron: 26 ug/dL — ABNORMAL LOW (ref 45–182)
Saturation Ratios: 9 % — ABNORMAL LOW (ref 17.9–39.5)
TIBC: 280 ug/dL (ref 250–450)
UIBC: 254 ug/dL

## 2024-01-30 LAB — APTT: aPTT: 26 s (ref 24–36)

## 2024-01-30 LAB — PREPARE RBC (CROSSMATCH)

## 2024-01-30 LAB — CBG MONITORING, ED
Glucose-Capillary: 446 mg/dL — ABNORMAL HIGH (ref 70–99)
Glucose-Capillary: 445 mg/dL — ABNORMAL HIGH (ref 70–99)
Glucose-Capillary: 407 mg/dL — ABNORMAL HIGH (ref 70–99)

## 2024-01-30 LAB — HIV ANTIBODY (ROUTINE TESTING W REFLEX): HIV Screen 4th Generation wRfx: NONREACTIVE

## 2024-01-30 LAB — OSMOLALITY: Osmolality: 319 mosm/kg — ABNORMAL HIGH (ref 275–295)

## 2024-01-30 LAB — FERRITIN: Ferritin: 26 ng/mL (ref 24–336)

## 2024-01-30 MED ORDER — LOPERAMIDE HCL 2 MG PO CAPS: 2.0000 mg | ORAL_CAPSULE | ORAL | Status: DC | PRN

## 2024-01-30 MED ORDER — INSULIN ASPART 100 UNIT/ML IJ SOLN
0.0000 [IU] | Freq: Three times a day (TID) | INTRAMUSCULAR | Status: DC
  Administered 2024-01-31: 11 [IU] via SUBCUTANEOUS
  Administered 2024-01-31: 15 [IU] via SUBCUTANEOUS
  Filled 2024-01-30: qty 1
  Filled 2024-01-30: qty 11

## 2024-01-30 MED ORDER — MELATONIN 5 MG PO TABS
5.0000 mg | ORAL_TABLET | Freq: Every day | ORAL | Status: DC
  Administered 2024-01-30 – 2024-02-08 (×10): 5 mg via ORAL
  Filled 2024-01-30 (×10): qty 1

## 2024-01-30 MED ORDER — INSULIN ASPART 100 UNIT/ML IJ SOLN
0.0000 [IU] | Freq: Three times a day (TID) | INTRAMUSCULAR | Status: DC
  Administered 2024-01-30: 10 [IU] via SUBCUTANEOUS
  Administered 2024-01-30: 20 [IU] via SUBCUTANEOUS
  Filled 2024-01-30: qty 10
  Filled 2024-01-30: qty 20

## 2024-01-30 MED ORDER — PANTOPRAZOLE SODIUM 40 MG IV SOLR
80.0000 mg | Freq: Once | INTRAVENOUS | Status: AC
  Administered 2024-01-30: 80 mg via INTRAVENOUS
  Filled 2024-01-30: qty 20

## 2024-01-30 MED ORDER — HYDROMORPHONE HCL 1 MG/ML IJ SOLN
0.5000 mg | INTRAMUSCULAR | Status: DC | PRN
  Administered 2024-01-30 – 2024-01-31 (×2): 0.5 mg via INTRAVENOUS
  Administered 2024-01-31: 1 mg via INTRAVENOUS
  Administered 2024-01-31: 0.5 mg via INTRAVENOUS
  Administered 2024-02-01: 1 mg via INTRAVENOUS
  Administered 2024-02-01: 0.5 mg via INTRAVENOUS
  Administered 2024-02-01 – 2024-02-03 (×5): 1 mg via INTRAVENOUS
  Filled 2024-01-30 (×11): qty 1

## 2024-01-30 MED ORDER — FUROSEMIDE 40 MG PO TABS
80.0000 mg | ORAL_TABLET | Freq: Two times a day (BID) | ORAL | Status: DC
  Administered 2024-01-31: 80 mg via ORAL
  Filled 2024-01-30: qty 2

## 2024-01-30 MED ORDER — ATORVASTATIN CALCIUM 20 MG PO TABS
40.0000 mg | ORAL_TABLET | Freq: Every day | ORAL | Status: DC
  Administered 2024-01-31 – 2024-02-09 (×10): 40 mg via ORAL
  Filled 2024-01-30 (×10): qty 2

## 2024-01-30 MED ORDER — PREGABALIN 75 MG PO CAPS
150.0000 mg | ORAL_CAPSULE | Freq: Three times a day (TID) | ORAL | Status: DC
  Administered 2024-01-30 – 2024-02-04 (×16): 150 mg via ORAL
  Filled 2024-01-30 (×16): qty 2

## 2024-01-30 MED ORDER — BACLOFEN 10 MG PO TABS
15.0000 mg | ORAL_TABLET | Freq: Three times a day (TID) | ORAL | Status: DC
  Administered 2024-01-30 – 2024-02-04 (×15): 15 mg via ORAL
  Filled 2024-01-30 (×2): qty 1.5
  Filled 2024-01-30: qty 2
  Filled 2024-01-30 (×2): qty 1.5
  Filled 2024-01-30 (×2): qty 2
  Filled 2024-01-30: qty 1.5
  Filled 2024-01-30 (×3): qty 2
  Filled 2024-01-30: qty 1.5
  Filled 2024-01-30 (×2): qty 2
  Filled 2024-01-30 (×2): qty 1.5

## 2024-01-30 MED ORDER — INSULIN GLARGINE-YFGN 100 UNIT/ML ~~LOC~~ SOLN
20.0000 [IU] | Freq: Once | SUBCUTANEOUS | Status: AC
  Administered 2024-01-30: 20 [IU] via SUBCUTANEOUS
  Filled 2024-01-30: qty 0.2

## 2024-01-30 MED ORDER — METOPROLOL TARTRATE 5 MG/5ML IV SOLN: 5.0000 mg | INTRAVENOUS | Status: DC | PRN

## 2024-01-30 MED ORDER — INSULIN ASPART 100 UNIT/ML IJ SOLN
20.0000 [IU] | Freq: Once | INTRAMUSCULAR | Status: AC
  Administered 2024-01-30: 20 [IU] via SUBCUTANEOUS
  Filled 2024-01-30: qty 20

## 2024-01-30 MED ORDER — CHLORHEXIDINE GLUCONATE CLOTH 2 % EX PADS
6.0000 | MEDICATED_PAD | Freq: Every day | CUTANEOUS | Status: DC
  Administered 2024-02-01 – 2024-02-09 (×8): 6 via TOPICAL
  Filled 2024-01-30 (×2): qty 6

## 2024-01-30 MED ORDER — LACTATED RINGERS IV BOLUS
1000.0000 mL | Freq: Once | INTRAVENOUS | Status: AC
  Administered 2024-01-30: 1000 mL via INTRAVENOUS

## 2024-01-30 MED ORDER — INSULIN GLARGINE-YFGN 100 UNIT/ML ~~LOC~~ SOLN
20.0000 [IU] | Freq: Every day | SUBCUTANEOUS | Status: DC
  Administered 2024-01-30 – 2024-01-31 (×2): 20 [IU] via SUBCUTANEOUS
  Filled 2024-01-30 (×2): qty 0.2

## 2024-01-30 MED ORDER — IRON SUCROSE 500 MG IVPB - SIMPLE MED
500.0000 mg | Freq: Once | INTRAVENOUS | Status: AC
  Administered 2024-01-31: 500 mg via INTRAVENOUS
  Filled 2024-01-30: qty 500

## 2024-01-30 MED ORDER — INSULIN ASPART 100 UNIT/ML IJ SOLN
10.0000 [IU] | Freq: Once | INTRAMUSCULAR | Status: AC
  Administered 2024-01-30: 10 [IU] via INTRAVENOUS
  Filled 2024-01-30: qty 10

## 2024-01-30 MED ORDER — SENNOSIDES-DOCUSATE SODIUM 8.6-50 MG PO TABS
2.0000 | ORAL_TABLET | Freq: Every evening | ORAL | Status: DC | PRN
  Administered 2024-02-04 – 2024-02-05 (×2): 2 via ORAL
  Filled 2024-01-30 (×2): qty 2

## 2024-01-30 MED ORDER — FINASTERIDE 5 MG PO TABS
5.0000 mg | ORAL_TABLET | Freq: Every day | ORAL | Status: DC
  Administered 2024-01-31 – 2024-02-09 (×10): 5 mg via ORAL
  Filled 2024-01-30 (×10): qty 1

## 2024-01-30 MED ORDER — ONDANSETRON HCL 4 MG PO TABS: 4.0000 mg | ORAL_TABLET | Freq: Four times a day (QID) | ORAL | Status: DC | PRN

## 2024-01-30 MED ORDER — ASPIRIN 81 MG PO TBEC
81.0000 mg | DELAYED_RELEASE_TABLET | Freq: Every day | ORAL | Status: DC
  Administered 2024-01-31: 81 mg via ORAL
  Filled 2024-01-30: qty 1

## 2024-01-30 MED ORDER — TAMSULOSIN HCL 0.4 MG PO CAPS
0.8000 mg | ORAL_CAPSULE | Freq: Every day | ORAL | Status: DC
  Administered 2024-01-31 – 2024-02-09 (×10): 0.8 mg via ORAL
  Filled 2024-01-30 (×10): qty 2

## 2024-01-30 MED ORDER — HYDROMORPHONE HCL 1 MG/ML IJ SOLN
1.0000 mg | Freq: Once | INTRAMUSCULAR | Status: AC
  Administered 2024-01-30: 1 mg via INTRAVENOUS
  Filled 2024-01-30: qty 1

## 2024-01-30 MED ORDER — INSULIN ASPART 100 UNIT/ML IJ SOLN: 0.0000 [IU] | Freq: Every day | INTRAMUSCULAR | Status: DC

## 2024-01-30 MED ORDER — PANTOPRAZOLE SODIUM 40 MG IV SOLR
40.0000 mg | Freq: Two times a day (BID) | INTRAVENOUS | Status: DC
  Administered 2024-01-30 – 2024-02-04 (×10): 40 mg via INTRAVENOUS
  Filled 2024-01-30 (×10): qty 10

## 2024-01-30 MED ORDER — GABAPENTIN 600 MG PO TABS: 600.0000 mg | ORAL_TABLET | Freq: Three times a day (TID) | ORAL | Status: DC

## 2024-01-30 MED ORDER — ONDANSETRON HCL 4 MG/2ML IJ SOLN
4.0000 mg | Freq: Four times a day (QID) | INTRAMUSCULAR | Status: DC | PRN
  Administered 2024-02-03 – 2024-02-06 (×4): 4 mg via INTRAVENOUS
  Filled 2024-01-30 (×4): qty 2

## 2024-01-30 MED ORDER — OXYCODONE HCL 5 MG PO TABS
15.0000 mg | ORAL_TABLET | Freq: Four times a day (QID) | ORAL | Status: DC | PRN
  Administered 2024-02-01 – 2024-02-03 (×4): 15 mg via ORAL
  Filled 2024-01-30 (×4): qty 3

## 2024-01-30 MED ORDER — BISACODYL 10 MG RE SUPP
10.0000 mg | Freq: Every day | RECTAL | Status: DC | PRN
  Administered 2024-02-07: 10 mg via RECTAL
  Filled 2024-01-30: qty 1

## 2024-01-30 MED ORDER — SODIUM CHLORIDE 0.9 % IV SOLN
10.0000 mL/h | Freq: Once | INTRAVENOUS | Status: AC
  Administered 2024-01-30: 10 mL/h via INTRAVENOUS

## 2024-01-30 NOTE — ED Triage Notes (Signed)
 BIBEMS from home. Pt c/o sharp and constant tail bone pain as well as flank and nub pain. He states pain is 10/10 and has been going on for 3-4 days. Pt states the power wheelchair he uses to get around has stopped working, he also lives alone and has no one to help him care for himself.   EMS VS: 96 HR, 96% on RA, 154/ 68 BP

## 2024-01-30 NOTE — ED Notes (Signed)
 This RN gave pt's wife Si Jachim an update on pt.

## 2024-01-30 NOTE — ED Provider Notes (Signed)
 [facility] Provider Note    Event Date/Time   First MD Initiated Contact with Patient 01/30/24 [phone]     (approximate)   History   Back Pain   HPI  Russell [name] is a 58 y.o. male who presents to the ED for evaluation of Back Pain   I reviewed 2 ED visits in the past week as well as a PCP visit from 1 month ago.  History of DM and multiple amputations: Right BKA, left AKA and left forearm.  Chronic pain syndrome.  Started on Augmentin  on 11/27 due to right fingers infection  Patient presents to the ED due to poorly controlled chronic pain.  Reports chronic tailbone pain and phantom pain.  Reports compliance to Augmentin  but reports pulling out the Foley catheter at home because it was too small.  Denies fevers.  Does report dark/black color diarrhea at home of the past couple days while taking BC powders for his chronic back pain.  Has not been taking his insulin  at home because his motorized wheelchair is no longer working and he has difficulty getting to his medication   Physical Exam   Triage Vital Signs: ED Triage Vitals  Encounter Vitals Group     BP      Girls Systolic BP Percentile      Girls Diastolic BP Percentile      Boys Systolic BP Percentile      Boys Diastolic BP Percentile      Pulse      Resp      Temp      Temp src      SpO2      Weight      Height      Head Circumference      Peak Flow      Pain Score      Pain Loc      Pain Education      Exclude from Growth Chart     Most recent vital signs: Vitals:   01/30/24 0812  BP: (!) 122/58  Pulse: 88  Resp: 16  Temp: 98.2 F (36.8 C)  SpO2: 100%    General: Awake, no distress.  CV:  Good peripheral perfusion.  Resp:  Normal effort.  Abd:  No distention.  Mild epigastric discomfort with palpation, otherwise nontender MSK:  No deformity noted.  Neuro:  No focal deficits appreciated. Other:     ED Results / Procedures / Treatments   Labs (all labs ordered are  listed, but only abnormal results are displayed) Labs Reviewed  CBC WITH DIFFERENTIAL/PLATELET - Abnormal; Notable for the following components:      Result Value   RBC 2.83 (*)    Hemoglobin 6.0 (*)    HCT 19.2 (*)    MCV 67.8 (*)    MCH 21.2 (*)    RDW 19.4 (*)    All other components within normal limits  BASIC METABOLIC PANEL WITH GFR - Abnormal; Notable for the following components:   Sodium 127 (*)    Chloride 96 (*)    CO2 20 (*)    Glucose, Bld 613 (*)    BUN 62 (*)    Calcium  8.3 (*)    All other components within normal limits  URINALYSIS, ROUTINE W REFLEX MICROSCOPIC  PROTIME-INR  APTT  OSMOLALITY  TYPE AND SCREEN  PREPARE RBC (CROSSMATCH)    EKG   RADIOLOGY   Official radiology report(s): No results found.  PROCEDURES and  INTERVENTIONS:  .Critical Care  Performed by: Claudene Rover, MD Authorized by: Claudene Rover, MD   Critical care provider statement:    Critical care time (minutes):  30   Critical care time was exclusive of:  Separately billable procedures and treating other patients   Critical care was necessary to treat or prevent imminent or life-threatening deterioration of the following conditions:  Circulatory failure and endocrine crisis   Critical care was time spent personally by me on the following activities:  Development of treatment plan with patient or surrogate, discussions with consultants, evaluation of patient's response to treatment, examination of patient, ordering and review of laboratory studies, ordering and review of radiographic studies, ordering and performing treatments and interventions, pulse oximetry, re-evaluation of patient's condition and review of old charts   Medications  pantoprazole  (PROTONIX ) injection 80 mg (has no administration in time range)  0.9 %  sodium chloride  infusion (has no administration in time range)  insulin  aspart (novoLOG ) injection 10 Units (has no administration in time range)  lactated ringers   bolus 1,000 mL (has no administration in time range)  HYDROmorphone  (DILAUDID ) injection 1 mg (1 mg Intravenous Given 01/30/24 0823)     IMPRESSION / MDM / ASSESSMENT AND PLAN / ED COURSE  I reviewed the triage vital signs and the nursing notes.  Differential diagnosis includes, but is not limited to, chronic pain syndrome, malingering, C. difficile, upper GI bleeding, DKA, HHS  {Patient presents with symptoms of an acute illness or injury that is potentially life-threatening.  Patient presents with signs of upper GI bleeding, poorly controlled chronic pain and hyperglycemia requiring medical admission.  Hemodynamically stable, primarily complaining of chronic sacral pain without additional injuries.  Blood work with microcytic anemia with significant drop in just the past 3 days, initiate transfusion of 2 units PRBC.  Start high-dose PPI.   He is hyperglycemia to 600 with pseudohyponatremia with intact renal function, start IV fluids and small dose IV insulin  bolus.   Add on serum osmolality.   Consult GI medicine for admission  Clinical Course as of 01/30/24 1144  Mon Jan 30, 2024  9182 Reassessed and clarified history [DS]  36 Reassessed and discussed my concerns with lower hemoglobin, elevated glucose.  He confirms taking BC powders at home due to his chronic back pain.  We discussed need for PRBC transfusion and he is agreeable [DS]  1011 I send a secure chat message to GI Dr. Jinny [DS]  1030 I consulted medicine who agrees to admit [DS]    Clinical Course User Index [DS] Claudene Rover, MD     FINAL CLINICAL IMPRESSION(S) / ED DIAGNOSES   Final diagnoses:  Upper GI bleeding  Blood loss anemia  Hyperglycemia     Rx / DC Orders   ED Discharge Orders     None        Note:  This document was prepared using Dragon voice recognition software and may include unintentional dictation errors.   Claudene Rover, MD 01/30/24 [phone]

## 2024-01-30 NOTE — ED Notes (Signed)
Pt given a full liquid diet tray.

## 2024-01-30 NOTE — ED Notes (Addendum)
 Dr. Laurita notified of pt's CBG reading of 445

## 2024-01-30 NOTE — ED Notes (Signed)
 Per Dr. Laurita, we will administer 10 units Novolog 

## 2024-01-30 NOTE — ED Notes (Signed)
 Pt refusing to keep his cardiac leads on. Electrodes replaced several times and pt removes them. Pt states he does not want to have all the cords on him.

## 2024-01-30 NOTE — ED Notes (Signed)
 Pt sitting on side of bed and refuses to have side rails put up.

## 2024-01-30 NOTE — ED Notes (Addendum)
 Consent for blood transfusion obtained from patient, form left in designated cubby on providers desk.

## 2024-01-30 NOTE — H&P (Signed)
 History and Physical    Russell [name] FMW:[id number] DOB: 05/06/1965 DOA: 01/30/2024  PCP: Eliverto [name], MD (Confirm with patient/family/NH records and if not entered, this has to be entered at [facility] point of entry) Patient coming from: Home  I have personally briefly reviewed patient's old medical records in [facility]  Chief Complaint: Black stool  HPI: Russell [name] is a 58 y.o. male with medical history significant of PVD status post right-sided BKA, left-sided AKA, left-sided forearm amputation, GI bleed secondary to peptic ulcer, chronic HFrEF with recovered LVEF, HTN, HLD, BPH, morbid obesity, presented with black tarry stool.  Patient reported poorly controlled chronic back pain and  tailbone pain for which he has been taking OTC BC powders and last 3 days he has seen a large amount of black tarry stool but denied any epigastric pain no nauseous vomiting.  He denied any chest pain no lightheadedness or palpitations. ED Course: Afebrile, blood pressure 120/50 O2 saturation 100% on room air.  Blood work showed hemoglobin 6.0 compared to baseline of 11.3 of last month, WBC 8.0 BUN 62 creatinine 1.4 glucose 613 bicarb 20.  Patient was given PPI, and Lantus  and NovoLog  in the ED.  Review of Systems: As per HPI otherwise 14 point review of systems negative.    Past Medical History:  Diagnosis Date   Anxiety    CHF (congestive heart failure) (HCC)    EF 30-35%   Chronic diastolic (congestive) heart failure (HCC)    Depression    Diabetes mellitus without complication (HCC)    Hyperlipemia    Hypertension    Morbid obesity (HCC)    Peripheral edema     Past Surgical History:  Procedure Laterality Date   AMPUTATION Left 09/07/2023   Procedure: AMPUTATION, ABOVE KNEE;  Surgeon: Marea Selinda RAMAN, MD;  Location: [facility] ORS;  Service: General;  Laterality: Left;   APPENDECTOMY     BELOW KNEE LEG AMPUTATION     Right leg   BIOPSY  10/14/2022   Procedure: BIOPSY;   Surgeon: Aundria, Ladell POUR, MD;  Location: [facility] ENDOSCOPY;  Service: Gastroenterology;;   COLONOSCOPY N/A 10/16/2022   Procedure: COLONOSCOPY;  Surgeon: Therisa Bi, MD;  Location: [facility] ENDOSCOPY;  Service: Gastroenterology;  Laterality: N/A;   ESOPHAGOGASTRODUODENOSCOPY (EGD) WITH PROPOFOL  N/A 10/26/2022   Procedure: ESOPHAGOGASTRODUODENOSCOPY (EGD) WITH PROPOFOL ;  Surgeon: Maryruth Ole DASEN, MD;  Location: [facility] ENDOSCOPY;  Service: Endoscopy;  Laterality: N/A;   ESOPHAGOGASTRODUODENOSCOPY (EGD) WITH PROPOFOL  N/A 10/14/2022   Procedure: ESOPHAGOGASTRODUODENOSCOPY (EGD) WITH PROPOFOL ;  Surgeon: Toledo, Ladell POUR, MD;  Location: [facility] ENDOSCOPY;  Service: Gastroenterology;  Laterality: N/A;   INCISION AND DRAINAGE ABSCESS Left 02/27/2023   Procedure: INCISION AND DRAINAGE ABSCESS LEFT FINGER AND ARM;  Surgeon: Robbin Redell Ned, MD;  Location: [facility] ORS;  Service: Orthopedics;  Laterality: Left;   TONSILLECTOMY       reports that he has never smoked. He has never used smokeless tobacco. He reports that he does not drink alcohol and does not use drugs.  Allergies  Allergen Reactions   Fluoxetine     Other reaction(s): Hallucination   Shellfish Allergy Hives   Sulfa Antibiotics Hives   Vancomycin      Other reaction(s): Red Man Syndrome   Metformin Nausea Only    Family History  Problem Relation Age of Onset   COPD Mother    Diabetes Father    CAD Father    Kidney cancer Brother      Prior to Admission  medications   Medication Sig Start Date End Date Taking? Authorizing Provider  acetaminophen  (TYLENOL ) 325 MG tablet Take 2 tablets (650 mg total) by mouth every 6 (six) hours as needed for mild pain (pain score 1-3) (or Fever >/= 101). 03/02/23   Barbarann Nest, MD  amoxicillin -clavulanate (AUGMENTIN ) 875-125 MG tablet Take 1 tablet by mouth 2 (two) times daily for 7 days. 01/26/24 02/02/24  Levander Slate, MD  aspirin  EC 81 MG tablet Take 81 mg by mouth.  Take 1 tablet (81 mg total) by  mouth once daily 04/22/23 04/22/24  [provider]  atorvastatin  (LIPITOR) 40 MG tablet Take 40 mg by mouth daily.     [provider]  baclofen  (LIORESAL ) 10 MG tablet Take 15 mg by mouth every 8 (eight) hours. 06/30/23   [provider]  bisacodyl  (DULCOLAX) 10 MG suppository Place 10 mg rectally daily as needed for moderate constipation.    [provider]  buPROPion  (WELLBUTRIN  XL) 150 MG 24 hr tablet Take 1 tablet (150 mg total) by mouth daily. 11/06/22 10/06/23  Trudy Anthony HERO, MD  cetirizine (ZYRTEC) 10 MG tablet Take 10 mg by mouth daily as needed for allergies. 06/23/20   [provider]  diclofenac Sodium (VOLTAREN) 1 % GEL Apply 2 g topically daily as needed (for pain).    [provider]  diphenhydrAMINE  (BENADRYL ) 25 mg capsule Take 1 capsule (25 mg total) by mouth every 6 (six) hours as needed for allergies or itching (30 minutes prior to starting Vancomycin ). Patient not taking: Reported on 10/06/2023 03/02/23   Barbarann Nest, MD  DULoxetine  (CYMBALTA ) 30 MG capsule Take 90 mg by mouth daily.    [provider]  finasteride  (PROSCAR ) 5 MG tablet Take 5 mg by mouth daily. 08/17/23   [provider]  furosemide  (LASIX ) 80 MG tablet Take 80 mg by mouth 2 (two) times daily. 12/08/22   [provider]  gabapentin  (NEURONTIN ) 600 MG tablet Take 600 mg by mouth 3 (three) times daily. 06/30/23   [provider]  HUMULIN  R 100 UNIT/ML injection Inject 0.1 mLs (10 Units total) into the skin 3 (three) times daily with meals. 11/16/22   Awanda City, MD  insulin  glargine, 1 Unit Dial, (TOUJEO  SOLOSTAR) 300 UNIT/ML Solostar Pen Inject 20 Units into the skin at bedtime.    [provider]  loperamide (IMODIUM A-D) 2 MG tablet Take 2 mg by mouth as needed for diarrhea or loose stools.    [provider]  melatonin 5 MG TABS Take 1 tablet (5 mg total) by mouth at bedtime. 11/16/22   Awanda City, MD  metoprolol   succinate (TOPROL -XL) 100 MG 24 hr tablet Take 100 mg by mouth daily. 12/08/22   [provider]  mupirocin  ointment (BACTROBAN ) 2 % Apply to penis topically as needed.    [provider]  oxyCODONE  (ROXICODONE ) 15 MG immediate release tablet Take 15 mg by mouth every 6 (six) hours as needed.    [provider]  oxyCODONE  (ROXICODONE ) 15 MG immediate release tablet Take 1 tablet (15 mg total) by mouth every 6 (six) hours as needed for pain. 10/09/23   Dorothyann Drivers, MD  oxymetazoline  (AFRIN) 0.05 % nasal spray Place 1 spray into both nostrils 2 (two) times daily as needed for congestion.    [provider]  polyethylene glycol (MIRALAX  / GLYCOLAX ) 17 g packet Take 17 g by mouth daily as needed. 11/16/22   Awanda City, MD  potassium chloride  (MICRO-K )  10 MEQ CR capsule Take by mouth 2 (two) times daily. :1 Tablet(s) By Mouth Twice Daily    [provider]  pregabalin  (LYRICA ) 150 MG capsule Take 150 mg by mouth 3 (three) times daily. 08/15/23   [provider]  senna (SENOKOT) 8.6 MG TABS tablet Take 2 tablets by mouth daily.    [provider]  senna-docusate (SENOKOT-S) 8.6-50 MG tablet Take 2 tablets by mouth 2 (two) times daily. 09/16/23   [provider]  tamsulosin  (FLOMAX ) 0.4 MG CAPS capsule Take 0.8 mg by mouth daily.    [provider]    Physical Exam: Vitals:   01/30/24 0813 01/30/24 1128 01/30/24 1156 01/30/24 1200  BP:  (!) 118/59 (!) 131/55 (!) 121/54  Pulse:  93 84 90  Resp:  15 16   Temp:  98.1 F (36.7 C) 98.2 F (36.8 C)   TempSrc:  Oral Oral   SpO2:  100%  100%  Weight: 123.8 kg       Constitutional: NAD, calm, comfortable Vitals:   01/30/24 0813 01/30/24 1128 01/30/24 1156 01/30/24 1200  BP:  (!) 118/59 (!) 131/55 (!) 121/54  Pulse:  93 84 90  Resp:  15 16   Temp:  98.1 F (36.7 C) 98.2 F (36.8 C)   TempSrc:  Oral Oral   SpO2:  100%  100%  Weight: 123.8 kg      Eyes: PERRL, lids  and conjunctivae normal ENMT: Mucous membranes are moist. Posterior pharynx clear of any exudate or lesions.Normal dentition.  Neck: normal, supple, no masses, no thyromegaly Respiratory: clear to auscultation bilaterally, no wheezing, no crackles. Normal respiratory effort. No accessory muscle use.  Cardiovascular: Regular rate and rhythm, no murmurs / rubs / gallops. No extremity edema. 2+ pedal pulses. No carotid bruits.  Abdomen: no tenderness, no masses palpated. No hepatosplenomegaly. Bowel sounds positive.  Musculoskeletal: no clubbing / cyanosis. No joint deformity upper and lower extremities. Good ROM, no contractures. Normal muscle tone.  Skin: no rashes, lesions, ulcers. No induration Neurologic: CN 2-12 grossly intact. Sensation intact, DTR normal. Strength 5/5 in all 4.  Psychiatric: Normal judgment and insight. Alert and oriented x 3. Normal mood.     Labs on Admission: I have personally reviewed following labs and imaging studies  CBC: Recent Labs  Lab 01/26/24 1525 01/27/24 1734 01/30/24 0808  WBC 8.0 8.4 8.0  NEUTROABS 6.3 6.4 5.1  HGB 11.3* 11.3* 6.0*  HCT 36.7* 36.2* 19.2*  MCV 67.5* 67.4* 67.8*  PLT 230 235 209   Basic Metabolic Panel: Recent Labs  Lab 01/26/24 1525 01/27/24 1734 01/30/24 0808  NA 132* 132* 127*  K 4.4 4.3 4.6  CL 98 97* 96*  CO2 25 23 20*  GLUCOSE 463* 381* 613*  BUN 16 16 62*  CREATININE 0.72 0.78 1.14  CALCIUM  9.2 9.0 8.3*   GFR: Estimated Creatinine Clearance: 93.5 mL/min (by C-G formula based on SCr of 1.14 mg/dL). Liver Function Tests: Recent Labs  Lab 01/26/24 1525 01/27/24 1734  AST 16 18  ALT 13 12  ALKPHOS 169* 151*  BILITOT 0.4 0.3  PROT 7.4 7.3  ALBUMIN  3.5 3.6   No results for input(s): LIPASE, AMYLASE in the last 168 hours. No results for input(s): AMMONIA in the last 168 hours. Coagulation Profile: Recent Labs  Lab 01/30/24 0942  INR 1.1   Cardiac Enzymes: No results for input(s): CKTOTAL,  CKMB, CKMBINDEX, TROPONINI in the last 168 hours. BNP (last 3 results) No results for  input(s): PROBNP in the last 8760 hours. HbA1C: No results for input(s): HGBA1C in the last 72 hours. CBG: Recent Labs  Lab 01/26/24 1635 01/26/24 1709 01/27/24 2028 01/30/24 1152  GLUCAP 431* 394* 294* 446*   Lipid Profile: No results for input(s): CHOL, HDL, LDLCALC, TRIG, CHOLHDL, LDLDIRECT in the last 72 hours. Thyroid  Function Tests: No results for input(s): TSH, T4TOTAL, FREET4, T3FREE, THYROIDAB in the last 72 hours. Anemia Panel: Recent Labs    01/30/24 0808  FERRITIN 26  TIBC 280  IRON  26*  RETICCTPCT 2.3   Urine analysis:    Component Value Date/Time   COLORURINE STRAW (A) 01/26/2024 1757   APPEARANCEUR CLEAR (A) 01/26/2024 1757   APPEARANCEUR Clear 08/13/2013 1624   LABSPEC 1.027 01/26/2024 1757   LABSPEC 1.012 08/13/2013 1624   PHURINE 5.0 01/26/2024 1757   GLUCOSEU >=500 (A) 01/26/2024 1757   GLUCOSEU 150 mg/dL 93/84/7984 8375   HGBUR SMALL (A) 01/26/2024 1757   BILIRUBINUR NEGATIVE 01/26/2024 1757   BILIRUBINUR Negative 08/13/2013 1624   KETONESUR 5 (A) 01/26/2024 1757   PROTEINUR 100 (A) 01/26/2024 1757   NITRITE NEGATIVE 01/26/2024 1757   LEUKOCYTESUR NEGATIVE 01/26/2024 1757   LEUKOCYTESUR Negative 08/13/2013 1624    Radiological Exams on Admission: No results found.  EKG: None  Assessment/Plan Principal Problem:   GI bleed Active Problems:   Acute blood loss anemia  (please populate well all problems here in Problem List. (For example, if patient is on BP meds at home and you resume or decide to hold them, it is a problem that needs to be her. Same for CAD, COPD, HLD and so on)  Hematochezia - Clinically suspect recurrent duodenal ulcer secondary to NSAIDs use - Continue PPI twice daily - N.p.o., IV fluid, GI consulted - Agreed with PRBC x 2, recheck H&H this afternoon and tonight, and consider further transfusion for  hemodynamic instability. - Iron  study  IDDM with hyperglycemia - Secondary to nonadherence to insulin  regimen - Resume home regimen of Lantus   -SSI-  Morbid obesity - BMI= 38 - Outpatient GLP-1 agonist therapy evaluation  OSA - CPAP at bedtime  History of chronic HFrEF with normalized LVEF - Euvolemic - Hold off Lasix  today, change home regimen of metoprolol  to as needed Lopressor  today until H&H stabilized.  BPH - Continue Flomax  and Proscar   DVT prophylaxis: None Code Status: Full code Family Communication: Wife at bedside Disposition Plan: Patient is sick with lower GI bleed most likely secondary to recurrent peptic ulcer, requiring inpatient GI consultation and intervention and transfusion, expect more than 2 midnight hospital stay. Consults called: GI Admission status: PCU admit   Cort ONEIDA Mana MD [facility] Pager [phone]  01/30/2024, 12:38 PM

## 2024-01-30 NOTE — ED Notes (Signed)
 Pt refusing anymore meds or insulin  until he can eat. Provider made aware of same.

## 2024-01-30 NOTE — Inpatient Diabetes Management (Addendum)
 Inpatient Diabetes Program Recommendations  AACE/ADA: New Consensus Statement on Inpatient Glycemic Control (2015)  Target Ranges:  Prepandial:   less than 140 mg/dL      Peak postprandial:   less than 180 mg/dL (1-2 hours)      Critically ill patients:  140 - 180 mg/dL   Lab Results  Component Value Date   GLUCAP 294 (H) 01/27/2024   HGBA1C 10.1 (H) 09/06/2023    Latest Reference Range & Units 01/26/24 15:25 01/27/24 17:34 01/30/24 08:08  Glucose 70 - 99 mg/dL 536 (H) 618 (H) 386 (HH)  (HH): Data is critically high (H): Data is abnormally high  Review of Glycemic Control  Diabetes history: DM2 Outpatient Diabetes medications:  Toujeo  20 units daily Humulin  Rn 10 units tid meal coverage  Current orders for Inpatient glycemic control:  Novolog  5 units x 1 @ 16:37 01/29/24  Inpatient Diabetes Program Recommendations:   Per notes, pt has not taken insulin  x 2 days. Noted lab glucose 613 @ 0808 am Please consider: -Semglee  20 units now and daily -Novolog  0-15 units tid, 0-5 units hs correction -Novolog  5 units tid meal coverage if eats 50%  Thank you, Kelcy Baeten E. Tzipora Mcinroy, RN, MSN, CNS, CDCES  Diabetes Coordinator Inpatient Glycemic Control Team Team Pager [phone] (8am-5pm) 01/30/2024 10:26 AM

## 2024-01-30 NOTE — ED Notes (Signed)
 Dr Devon contacted at this time and made aware of pt's CBG of 407.

## 2024-01-30 NOTE — ED Notes (Signed)
 Called dietary to confirm they was sending pt a full liquid diet. They confirmed same.

## 2024-01-30 NOTE — Consult Note (Signed)
 Russell Copping, MD [facility]  [address] [place], [place] [address] Phone: [phone] Fax : [phone]  Consultation  Referring Provider:     Dr. Claudene Primary Care Physician:  Russell [name], MD Primary Gastroenterologist:  Dr. Aundria         Reason for Consultation:     Anemia  Date of Admission:  01/30/2024 Date of Consultation:  01/30/2024         HPI:   Russell [name] is a 58 y.o. male who presents to the emergency department for evaluation of back pain.  The patient has had 2 recent visits to the emergency department as well as his PCP within the last month.  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  The patient also has a history of chronic pain syndrome.  It appears that the patient had been in the ER and had a Foley catheter placed and he pulled out the Foley catheter at home.  The patient has reported to the ED physician dark/black stools at home for the last few days.  He states that he has been taking BC powders for chronic pain. When I came to see the patient today he seems focused on getting another urinary catheter in and does not want his peak about his GI bleeding.  The patient has been told that his hemoglobin was low and that it is likely that he is having a GI bleed with the black stools and he insisted that it was the Pepto-Bismol he was taking and also stated that he knows that his BCs caused it and that it was not something he was worried about.  He kept going back to the need for a Foley catheter and stated that if he does not get 1 he is going to burst. The patient's wife was in the room who does not live with him nor does she take care of him on a regular basis interjected which angered the patient whereupon he yelled at her.  The patient also had significant iron  deficiency anemia with a iron  level of 26 with a saturation of 9.  The patient's most recent labs have  shown:  Component     Latest Ref Rng 09/17/2023 10/05/2023 01/26/2024 01/27/2024 01/30/2024  Hemoglobin     13.0 - 17.0 g/dL 7.5 (L)  7.7 (L)  88.6 (L)  11.3 (L)  6.0 (L)   HCT     39.0 - 52.0 % 24.9 (L)  26.6 (L)  36.7 (L)  36.2 (L)  19.2 (L)    I reiterated to the patient that he may be having a GI bleed due to the anemia and black stools but he does not want to discuss this and is fixated on having a Foley catheter placed.  Past Medical History:  Diagnosis Date   Anxiety    CHF (congestive heart failure) (HCC)    EF 30-35%   Chronic diastolic (congestive) heart failure (HCC)    Depression    Diabetes mellitus without complication (HCC)    Hyperlipemia    Hypertension    Morbid obesity (HCC)    Peripheral edema     Past Surgical History:  Procedure Laterality Date   AMPUTATION Left 09/07/2023   Procedure: AMPUTATION, ABOVE KNEE;  Surgeon: Russell Selinda RAMAN, MD;  Location: [facility] ORS;  Service: General;  Laterality: Left;   APPENDECTOMY     BELOW  KNEE LEG AMPUTATION     Right leg   BIOPSY  10/14/2022   Procedure: BIOPSY;  Surgeon: Russell [name], Russell POUR, MD;  Location: [facility] ENDOSCOPY;  Service: Gastroenterology;;   COLONOSCOPY N/A 10/16/2022   Procedure: COLONOSCOPY;  Surgeon: Russell Bi, MD;  Location: [facility] ENDOSCOPY;  Service: Gastroenterology;  Laterality: N/A;   ESOPHAGOGASTRODUODENOSCOPY (EGD) WITH PROPOFOL  N/A 10/26/2022   Procedure: ESOPHAGOGASTRODUODENOSCOPY (EGD) WITH PROPOFOL ;  Surgeon: Russell Ole DASEN, MD;  Location: [facility] ENDOSCOPY;  Service: Endoscopy;  Laterality: N/A;   ESOPHAGOGASTRODUODENOSCOPY (EGD) WITH PROPOFOL  N/A 10/14/2022   Procedure: ESOPHAGOGASTRODUODENOSCOPY (EGD) WITH PROPOFOL ;  Surgeon: Russell [name], Russell POUR, MD;  Location: [facility] ENDOSCOPY;  Service: Gastroenterology;  Laterality: N/A;   INCISION AND DRAINAGE ABSCESS Left 02/27/2023   Procedure: INCISION AND DRAINAGE ABSCESS LEFT FINGER AND ARM;  Surgeon: Russell Redell Ned, MD;  Location: [facility] ORS;  Service:  Orthopedics;  Laterality: Left;   TONSILLECTOMY      Prior to Admission medications   Medication Sig Start Date End Date Taking? Authorizing Provider  acetaminophen  (TYLENOL ) 325 MG tablet Take 2 tablets (650 mg total) by mouth every 6 (six) hours as needed for mild pain (pain score 1-3) (or Fever >/= 101). 03/02/23   Russell Nest, MD  amoxicillin -clavulanate (AUGMENTIN ) 875-125 MG tablet Take 1 tablet by mouth 2 (two) times daily for 7 days. 01/26/24 02/02/24  Russell Slate, MD  aspirin  EC 81 MG tablet Take 81 mg by mouth.  Take 1 tablet (81 mg total) by mouth once daily 04/22/23 04/22/24  [provider]  atorvastatin  (LIPITOR) 40 MG tablet Take 40 mg by mouth daily.     [provider]  baclofen  (LIORESAL ) 10 MG tablet Take 15 mg by mouth every 8 (eight) hours. 06/30/23   [provider]  bisacodyl  (DULCOLAX) 10 MG suppository Place 10 mg rectally daily as needed for moderate constipation.    [provider]  buPROPion  (WELLBUTRIN  XL) 150 MG 24 hr tablet Take 1 tablet (150 mg total) by mouth daily. 11/06/22 10/06/23  Russell Anthony HERO, MD  cetirizine (ZYRTEC) 10 MG tablet Take 10 mg by mouth daily as needed for allergies. 06/23/20   [provider]  diclofenac Sodium (VOLTAREN) 1 % GEL Apply 2 g topically daily as needed (for pain).    [provider]  diphenhydrAMINE  (BENADRYL ) 25 mg capsule Take 1 capsule (25 mg total) by mouth every 6 (six) hours as needed for allergies or itching (30 minutes prior to starting Vancomycin ). Patient not taking: Reported on 10/06/2023 03/02/23   Russell Nest, MD  DULoxetine  (CYMBALTA ) 30 MG capsule Take 90 mg by mouth daily.    [provider]  finasteride  (PROSCAR ) 5 MG tablet Take 5 mg by mouth daily. 08/17/23   [provider]  furosemide  (LASIX ) 80 MG tablet Take 80 mg by mouth 2 (two) times daily. 12/08/22   [provider]  gabapentin  (NEURONTIN ) 600 MG tablet Take 600 mg by mouth 3  (three) times daily. 06/30/23   [provider]  HUMULIN  R 100 UNIT/ML injection Inject 0.1 mLs (10 Units total) into the skin 3 (three) times daily with meals. 11/16/22   Awanda City, MD  insulin  glargine, 1 Unit Dial, (TOUJEO  SOLOSTAR) 300 UNIT/ML Solostar Pen Inject 20 Units into the skin at bedtime.    [provider]  loperamide (IMODIUM A-D) 2 MG tablet Take 2 mg by mouth as needed for diarrhea or loose stools.    [provider]  melatonin 5 MG TABS Take  1 tablet (5 mg total) by mouth at bedtime. 11/16/22   Awanda City, MD  metoprolol  succinate (TOPROL -XL) 100 MG 24 hr tablet Take 100 mg by mouth daily. 12/08/22   [provider]  mupirocin  ointment (BACTROBAN ) 2 % Apply to penis topically as needed.    [provider]  oxyCODONE  (ROXICODONE ) 15 MG immediate release tablet Take 15 mg by mouth every 6 (six) hours as needed.    [provider]  oxyCODONE  (ROXICODONE ) 15 MG immediate release tablet Take 1 tablet (15 mg total) by mouth every 6 (six) hours as needed for pain. 10/09/23   Dorothyann Drivers, MD  oxymetazoline  (AFRIN) 0.05 % nasal spray Place 1 spray into both nostrils 2 (two) times daily as needed for congestion.    [provider]  polyethylene glycol (MIRALAX  / GLYCOLAX ) 17 g packet Take 17 g by mouth daily as needed. 11/16/22   Awanda City, MD  potassium chloride  (MICRO-K ) 10 MEQ CR capsule Take by mouth 2 (two) times daily. :1 Tablet(s) By Mouth Twice Daily    [provider]  pregabalin  (LYRICA ) 150 MG capsule Take 150 mg by mouth 3 (three) times daily. 08/15/23   [provider]  senna (SENOKOT) 8.6 MG TABS tablet Take 2 tablets by mouth daily.    [provider]  senna-docusate (SENOKOT-S) 8.6-50 MG tablet Take 2 tablets by mouth 2 (two) times daily. 09/16/23   [provider]  tamsulosin  (FLOMAX ) 0.4 MG CAPS capsule Take 0.8 mg by mouth daily.    [provider]    Family History   Problem Relation Age of Onset   COPD Mother    Diabetes Father    CAD Father    Kidney cancer Brother      Social History   Tobacco Use   Smoking status: Never   Smokeless tobacco: Never  Vaping Use   Vaping status: Never Used  Substance Use Topics   Alcohol use: No   Drug use: No    Allergies as of 01/30/2024 - Review Complete 01/30/2024  Allergen Reaction Noted   Fluoxetine  10/04/2017   Shellfish allergy Hives 11/20/2014   Sulfa antibiotics Hives 07/07/2015   Vancomycin   02/15/2018   Metformin Nausea Only 08/03/2017    Review of Systems:    All systems reviewed and negative except where noted in HPI.   Physical Exam:  Vital signs in last 24 hours: Temp:  [98.1 F (36.7 C)-98.2 F (36.8 C)] 98.2 F (36.8 C) (12/01 1156) Pulse Rate:  [84-93] 90 (12/01 1200) Resp:  [15-16] 16 (12/01 1156) BP: (118-131)/(54-59) 121/54 (12/01 1200) SpO2:  [100 %] 100 % (12/01 1200) Weight:  [123.8 kg] 123.8 kg (12/01 0813)   General:   Patient sitting up in bed appears uncomfortable  Head:  Normocephalic and atraumatic. Eyes:   No icterus.   Conjunctiva pink. PERRLA. Ears:  Normal auditory acuity. Neck:  Supple; no masses or thyroidomegaly Lungs: Respirations even and unlabored. Lungs clear to auscultation bilaterally.   No wheezes, crackles, or rhonchi.  Heart:  Regular rate and rhythm;  Without murmur, clicks, rubs or gallops Abdomen:  Soft, nondistended, nontender. Normal bowel sounds. No appreciable masses or hepatomegaly.  No rebound or guarding.  Rectal:  Not performed. Msk:  Symmetrical without gross deformities.    Extremities: Bilateral amputations of the lower extremities Neurologic:  Alert and oriented x3;  grossly normal neurologically. Skin:  Intact without significant lesions or rashes. Cervical Nodes:  No significant cervical adenopathy. Psych:  The patient appears to be fixated on his urinary issues  LAB RESULTS: Recent Labs    01/27/24 1734 01/30/24 0808   WBC 8.4 8.0  HGB 11.3* 6.0*  HCT 36.2* 19.2*  PLT 235 209   BMET Recent Labs    01/27/24 1734 01/30/24 0808  NA 132* 127*  K 4.3 4.6  CL 97* 96*  CO2 23 20*  GLUCOSE 381* 613*  BUN 16 62*  CREATININE 0.78 1.14  CALCIUM  9.0 8.3*   LFT Recent Labs    01/27/24 1734  PROT 7.3  ALBUMIN  3.6  AST 18  ALT 12  ALKPHOS 151*  BILITOT 0.3   PT/INR Recent Labs    01/30/24 0942  LABPROT 15.0  INR 1.1    STUDIES: No results found.    Impression / Plan:   Assessment: Principal Problem:   GI bleed Active Problems:   Acute blood loss anemia   Russell [name] is a 58 y.o. y/o male with significant anemia from 3 days ago but it appears that his previous hemoglobin and hematocrit have been low dating back to 2024 where he came in with a hemoglobin of 3.9 but is usually between 7 and 8. I have discussed the risks of the patient continuing to have a GI bleed with his black stools and the possible need for an upper endoscopy.  Plan:  The patient appears not to be concerned about the GI bleeding and is more fixated on his bladder issues.  The patient does not want to discuss nor does he want to consider any GI intervention at this time.  The patient will be treated for his other issues by the hospital team and the conversation on whether to move forward with any endoscopic evaluation will be taken up again when he feels that his other issues have been addressed.  Thank you for involving me in the care of this patient.      LOS: 0 days   Russell Copping, MD, MD. NOLIA 01/30/2024, 1:43 PM,  Pager [phone] 7am-5pm  Check AMION for 5pm -7am coverage and on weekends   Note: This dictation was prepared with Dragon dictation along with smaller phrase technology. Any transcriptional errors that result from this process are unintentional.

## 2024-01-30 NOTE — ED Notes (Signed)
 Bladder scan performed by this RN, 226 ml of urine in bladder.

## 2024-01-31 ENCOUNTER — Inpatient Hospital Stay: Admit: 2024-01-31

## 2024-01-31 ENCOUNTER — Inpatient Hospital Stay

## 2024-01-31 DIAGNOSIS — K922 Gastrointestinal hemorrhage, unspecified: Secondary | ICD-10-CM

## 2024-01-31 DIAGNOSIS — K921 Melena: Secondary | ICD-10-CM

## 2024-01-31 DIAGNOSIS — D62 Acute posthemorrhagic anemia: Secondary | ICD-10-CM | POA: Diagnosis not present

## 2024-01-31 DIAGNOSIS — K264 Chronic or unspecified duodenal ulcer with hemorrhage: Secondary | ICD-10-CM | POA: Diagnosis not present

## 2024-01-31 LAB — BLOOD GAS, ARTERIAL
Acid-base deficit: 3.9 mmol/L — ABNORMAL HIGH (ref 0.0–2.0)
Bicarbonate: 21.6 mmol/L (ref 20.0–28.0)
FIO2: 0.36 %
O2 Saturation: 98 %
Patient temperature: 37
pCO2 arterial: 40 mmHg (ref 32–48)
pH, Arterial: 7.34 — ABNORMAL LOW (ref 7.35–7.45)
pO2, Arterial: 97 mmHg (ref 83–108)

## 2024-01-31 LAB — URINALYSIS, W/ REFLEX TO CULTURE (INFECTION SUSPECTED)
Bacteria, UA: NONE SEEN
Bilirubin Urine: NEGATIVE
Glucose, UA: 150 mg/dL — AB
Hgb urine dipstick: NEGATIVE
Ketones, ur: NEGATIVE mg/dL
Leukocytes,Ua: NEGATIVE
Nitrite: NEGATIVE
Protein, ur: NEGATIVE mg/dL
Specific Gravity, Urine: 1.016 (ref 1.005–1.030)
pH: 5 (ref 5.0–8.0)

## 2024-01-31 LAB — MAGNESIUM: Magnesium: 1.9 mg/dL (ref 1.7–2.4)

## 2024-01-31 LAB — BASIC METABOLIC PANEL WITH GFR
Anion gap: 10 (ref 5–15)
BUN: 63 mg/dL — ABNORMAL HIGH (ref 6–20)
CO2: 20 mmol/L — ABNORMAL LOW (ref 22–32)
Calcium: 7.9 mg/dL — ABNORMAL LOW (ref 8.9–10.3)
Chloride: 102 mmol/L (ref 98–111)
Creatinine, Ser: 1.14 mg/dL (ref 0.61–1.24)
GFR, Estimated: >60 mL/min (ref >60)
Glucose, Bld: 379 mg/dL — ABNORMAL HIGH (ref 70–99)
Potassium: 4.7 mmol/L (ref 3.5–5.1)
Sodium: 131 mmol/L — ABNORMAL LOW (ref 135–145)

## 2024-01-31 LAB — HEMOGLOBIN AND HEMATOCRIT, BLOOD
HCT: 21.2 % — ABNORMAL LOW (ref 39.0–52.0)
HCT: 23.1 % — ABNORMAL LOW (ref 39.0–52.0)
Hemoglobin: 7.1 g/dL — ABNORMAL LOW (ref 13.0–17.0)
Hemoglobin: 7.8 g/dL — ABNORMAL LOW (ref 13.0–17.0)

## 2024-01-31 LAB — PHOSPHORUS: Phosphorus: 4.4 mg/dL (ref 2.5–4.6)

## 2024-01-31 LAB — CBC
HCT: 19.5 % — ABNORMAL LOW (ref 39.0–52.0)
Hemoglobin: 6.3 g/dL — ABNORMAL LOW (ref 13.0–17.0)
MCH: 23.7 pg — ABNORMAL LOW (ref 26.0–34.0)
MCHC: 32.3 g/dL (ref 30.0–36.0)
MCV: 73.3 fL — ABNORMAL LOW (ref 80.0–100.0)
Platelets: 216 K/uL (ref 150–400)
RBC: 2.66 MIL/uL — ABNORMAL LOW (ref 4.22–5.81)
RDW: 22.2 % — ABNORMAL HIGH (ref 11.5–15.5)
WBC: 11.9 K/uL — ABNORMAL HIGH (ref 4.0–10.5)
nRBC: 0 % (ref 0.0–0.2)

## 2024-01-31 LAB — LACTIC ACID, PLASMA
Lactic Acid, Venous: 2 mmol/L (ref 0.5–1.9)
Lactic Acid, Venous: 1.1 mmol/L (ref 0.5–1.9)

## 2024-01-31 LAB — GLUCOSE, CAPILLARY
Glucose-Capillary: 281 mg/dL — ABNORMAL HIGH (ref 70–99)
Glucose-Capillary: 323 mg/dL — ABNORMAL HIGH (ref 70–99)
Glucose-Capillary: 273 mg/dL — ABNORMAL HIGH (ref 70–99)

## 2024-01-31 LAB — PREPARE RBC (CROSSMATCH)

## 2024-01-31 LAB — PROCALCITONIN: Procalcitonin: 0.26 ng/mL

## 2024-01-31 LAB — PRO BRAIN NATRIURETIC PEPTIDE: Pro Brain Natriuretic Peptide: 104 pg/mL (ref <300.0)

## 2024-01-31 LAB — CBG MONITORING, ED
Glucose-Capillary: 303 mg/dL — ABNORMAL HIGH (ref 70–99)
Glucose-Capillary: 271 mg/dL — ABNORMAL HIGH (ref 70–99)

## 2024-01-31 MED ORDER — SODIUM CHLORIDE 0.9 % IV SOLN: 250.0000 mL | INTRAVENOUS | Status: AC

## 2024-01-31 MED ORDER — IOHEXOL 350 MG/ML SOLN
100.0000 mL | Freq: Once | INTRAVENOUS | Status: AC | PRN
  Administered 2024-01-31: 100 mL via INTRAVENOUS

## 2024-01-31 MED ORDER — AMIODARONE IV BOLUS ONLY 150 MG/100ML
150.0000 mg | Freq: Once | INTRAVENOUS | Status: AC
  Administered 2024-01-31: 150 mg via INTRAVENOUS
  Filled 2024-01-31: qty 100

## 2024-01-31 MED ORDER — NOREPINEPHRINE 4 MG/250ML-% IV SOLN
INTRAVENOUS | Status: AC
  Administered 2024-01-31: 10 ug/min via INTRAVENOUS
  Filled 2024-01-31: qty 250

## 2024-01-31 MED ORDER — AMIODARONE HCL IN DEXTROSE 360-4.14 MG/200ML-% IV SOLN
60.0000 mg/h | INTRAVENOUS | Status: AC
  Administered 2024-01-31 (×2): 60 mg/h via INTRAVENOUS
  Filled 2024-01-31: qty 200

## 2024-01-31 MED ORDER — NOREPINEPHRINE 16 MG/250ML-% IV SOLN
0.0000 ug/min | INTRAVENOUS | Status: DC
  Administered 2024-01-31: 12 ug/min via INTRAVENOUS
  Administered 2024-02-01: 18 ug/min via INTRAVENOUS
  Filled 2024-01-31 (×2): qty 250

## 2024-01-31 MED ORDER — SODIUM CHLORIDE 0.9% IV SOLUTION
Freq: Once | INTRAVENOUS | Status: AC
  Filled 2024-01-31: qty 250

## 2024-01-31 MED ORDER — INSULIN ASPART 100 UNIT/ML IJ SOLN
0.0000 [IU] | INTRAMUSCULAR | Status: DC
  Administered 2024-01-31: 11 [IU] via SUBCUTANEOUS
  Administered 2024-02-01: 15 [IU] via SUBCUTANEOUS
  Administered 2024-02-01: 11 [IU] via SUBCUTANEOUS
  Administered 2024-02-01: 15 [IU] via SUBCUTANEOUS
  Administered 2024-02-01: 7 [IU] via SUBCUTANEOUS
  Administered 2024-02-01: 3 [IU] via SUBCUTANEOUS
  Administered 2024-02-01: 4 [IU] via SUBCUTANEOUS
  Administered 2024-02-02: 3 [IU] via SUBCUTANEOUS
  Administered 2024-02-02 (×2): 7 [IU] via SUBCUTANEOUS
  Administered 2024-02-02 (×2): 4 [IU] via SUBCUTANEOUS
  Administered 2024-02-02 (×2): 3 [IU] via SUBCUTANEOUS
  Administered 2024-02-03: 4 [IU] via SUBCUTANEOUS
  Administered 2024-02-03: 3 [IU] via SUBCUTANEOUS
  Administered 2024-02-03: 4 [IU] via SUBCUTANEOUS
  Administered 2024-02-03: 11 [IU] via SUBCUTANEOUS
  Administered 2024-02-03: 4 [IU] via SUBCUTANEOUS
  Administered 2024-02-04: 7 [IU] via SUBCUTANEOUS
  Administered 2024-02-04: 4 [IU] via SUBCUTANEOUS
  Administered 2024-02-04: 3 [IU] via SUBCUTANEOUS
  Administered 2024-02-04: 4 [IU] via SUBCUTANEOUS
  Filled 2024-01-31: qty 7
  Filled 2024-01-31 (×3): qty 4
  Filled 2024-01-31: qty 7
  Filled 2024-01-31: qty 15
  Filled 2024-01-31 (×2): qty 7
  Filled 2024-01-31 (×2): qty 3
  Filled 2024-01-31: qty 11
  Filled 2024-01-31: qty 5
  Filled 2024-01-31: qty 3
  Filled 2024-01-31: qty 4
  Filled 2024-01-31: qty 5
  Filled 2024-01-31: qty 3
  Filled 2024-01-31: qty 15
  Filled 2024-01-31: qty 3
  Filled 2024-01-31: qty 4
  Filled 2024-01-31: qty 15
  Filled 2024-01-31: qty 4
  Filled 2024-01-31: qty 3

## 2024-01-31 MED ORDER — SODIUM CHLORIDE 0.9% IV SOLUTION: Freq: Once | INTRAVENOUS | Status: DC

## 2024-01-31 MED ORDER — FUROSEMIDE 10 MG/ML IJ SOLN
80.0000 mg | Freq: Once | INTRAMUSCULAR | Status: AC
  Administered 2024-01-31: 80 mg via INTRAVENOUS
  Filled 2024-01-31: qty 8

## 2024-01-31 MED ORDER — LACTATED RINGERS IV BOLUS: 500.0000 mL | Freq: Once | INTRAVENOUS | Status: DC

## 2024-01-31 MED ORDER — DILTIAZEM HCL 25 MG/5ML IV SOLN: 5.0000 mg | Freq: Once | INTRAVENOUS | Status: DC

## 2024-01-31 MED ORDER — NOREPINEPHRINE 4 MG/250ML-% IV SOLN: 0.0000 ug/min | INTRAVENOUS | Status: DC

## 2024-01-31 MED ORDER — SODIUM CHLORIDE 0.9% IV SOLUTION
Freq: Once | INTRAVENOUS | Status: DC
  Filled 2024-01-31: qty 250

## 2024-01-31 MED ORDER — AMIODARONE HCL IN DEXTROSE 360-4.14 MG/200ML-% IV SOLN
30.0000 mg/h | INTRAVENOUS | Status: DC
  Administered 2024-01-31 – 2024-02-01 (×4): 30 mg/h via INTRAVENOUS
  Filled 2024-01-31 (×4): qty 200

## 2024-01-31 NOTE — Plan of Care (Signed)
 Problem: Education: Goal: Ability to describe self-care measures that may prevent or decrease complications (Diabetes Survival Skills Education) will improve 01/31/2024 1948 by Cena Almarie NOVAK, RN Outcome: Progressing 01/31/2024 1948 by Cena Almarie NOVAK, RN Outcome: Progressing 01/31/2024 1948 by Cena Almarie NOVAK, RN Outcome: Progressing Goal: Individualized Educational Video(s) 01/31/2024 1948 by Cena Almarie NOVAK, RN Outcome: Progressing 01/31/2024 1948 by Cena Almarie NOVAK, RN Outcome: Progressing 01/31/2024 1948 by Cena Almarie NOVAK, RN Outcome: Progressing   Problem: Coping: Goal: Ability to adjust to condition or change in health will improve 01/31/2024 1948 by Cena Almarie NOVAK, RN Outcome: Progressing 01/31/2024 1948 by Cena Almarie NOVAK, RN Outcome: Progressing 01/31/2024 1948 by Cena Almarie NOVAK, RN Outcome: Progressing   Problem: Fluid Volume: Goal: Ability to maintain a balanced intake and output will improve 01/31/2024 1948 by Cena Almarie NOVAK, RN Outcome: Progressing 01/31/2024 1948 by Cena Almarie NOVAK, RN Outcome: Progressing 01/31/2024 1948 by Cena Almarie NOVAK, RN Outcome: Progressing   Problem: Health Behavior/Discharge Planning: Goal: Ability to identify and utilize available resources and services will improve 01/31/2024 1948 by Cena Almarie NOVAK, RN Outcome: Progressing 01/31/2024 1948 by Cena Almarie NOVAK, RN Outcome: Progressing 01/31/2024 1948 by Cena Almarie NOVAK, RN Outcome: Progressing Goal: Ability to manage health-related needs will improve 01/31/2024 1948 by Cena Almarie NOVAK, RN Outcome: Progressing 01/31/2024 1948 by Cena Almarie NOVAK, RN Outcome: Progressing 01/31/2024 1948 by Cena Almarie NOVAK, RN Outcome: Progressing   Problem: Metabolic: Goal: Ability to maintain appropriate glucose levels will improve 01/31/2024 1948 by Cena Almarie NOVAK, RN Outcome:  Progressing 01/31/2024 1948 by Cena Almarie NOVAK, RN Outcome: Progressing 01/31/2024 1948 by Cena Almarie NOVAK, RN Outcome: Progressing   Problem: Nutritional: Goal: Maintenance of adequate nutrition will improve 01/31/2024 1948 by Cena Almarie NOVAK, RN Outcome: Progressing 01/31/2024 1948 by Cena Almarie NOVAK, RN Outcome: Progressing 01/31/2024 1948 by Cena Almarie NOVAK, RN Outcome: Progressing Goal: Progress toward achieving an optimal weight will improve 01/31/2024 1948 by Cena Almarie NOVAK, RN Outcome: Progressing 01/31/2024 1948 by Cena Almarie NOVAK, RN Outcome: Progressing 01/31/2024 1948 by Cena Almarie NOVAK, RN Outcome: Progressing   Problem: Skin Integrity: Goal: Risk for impaired skin integrity will decrease 01/31/2024 1948 by Cena Almarie NOVAK, RN Outcome: Progressing 01/31/2024 1948 by Cena Almarie NOVAK, RN Outcome: Progressing 01/31/2024 1948 by Cena Almarie NOVAK, RN Outcome: Progressing   Problem: Tissue Perfusion: Goal: Adequacy of tissue perfusion will improve 01/31/2024 1948 by Cena Almarie NOVAK, RN Outcome: Progressing 01/31/2024 1948 by Cena Almarie NOVAK, RN Outcome: Progressing 01/31/2024 1948 by Cena Almarie NOVAK, RN Outcome: Progressing   Problem: Education: Goal: Knowledge of General Education information will improve Description: Including pain rating scale, medication(s)/side effects and non-pharmacologic comfort measures 01/31/2024 1948 by Cena Almarie NOVAK, RN Outcome: Progressing 01/31/2024 1948 by Cena Almarie NOVAK, RN Outcome: Progressing 01/31/2024 1948 by Cena Almarie NOVAK, RN Outcome: Progressing   Problem: Health Behavior/Discharge Planning: Goal: Ability to manage health-related needs will improve 01/31/2024 1948 by Cena Almarie NOVAK, RN Outcome: Progressing 01/31/2024 1948 by Cena Almarie NOVAK, RN Outcome: Progressing 01/31/2024 1948 by Cena Almarie NOVAK,  RN Outcome: Progressing   Problem: Clinical Measurements: Goal: Ability to maintain clinical measurements within normal limits will improve 01/31/2024 1948 by Cena Almarie NOVAK, RN Outcome: Progressing 01/31/2024 1948 by Cena Almarie NOVAK, RN Outcome: Progressing 01/31/2024 1948 by Cena Almarie NOVAK, RN Outcome: Progressing Goal: Will remain free from infection 01/31/2024 1948 by Cena Almarie NOVAK, RN Outcome: Progressing 01/31/2024 1948 by Cena Almarie NOVAK, RN Outcome: Progressing 01/31/2024 1948 by  Cena Almarie NOVAK, RN Outcome: Progressing Goal: Diagnostic test results will improve 01/31/2024 1948 by Cena Almarie NOVAK, RN Outcome: Progressing 01/31/2024 1948 by Cena Almarie NOVAK, RN Outcome: Progressing 01/31/2024 1948 by Cena Almarie NOVAK, RN Outcome: Progressing Goal: Respiratory complications will improve 01/31/2024 1948 by Cena Almarie NOVAK, RN Outcome: Progressing 01/31/2024 1948 by Cena Almarie NOVAK, RN Outcome: Progressing 01/31/2024 1948 by Cena Almarie NOVAK, RN Outcome: Progressing Goal: Cardiovascular complication will be avoided 01/31/2024 1948 by Cena Almarie NOVAK, RN Outcome: Progressing 01/31/2024 1948 by Cena Almarie NOVAK, RN Outcome: Progressing 01/31/2024 1948 by Cena Almarie NOVAK, RN Outcome: Progressing   Problem: Activity: Goal: Risk for activity intolerance will decrease 01/31/2024 1948 by Cena Almarie NOVAK, RN Outcome: Progressing 01/31/2024 1948 by Cena Almarie NOVAK, RN Outcome: Progressing 01/31/2024 1948 by Cena Almarie NOVAK, RN Outcome: Progressing   Problem: Nutrition: Goal: Adequate nutrition will be maintained 01/31/2024 1948 by Cena Almarie NOVAK, RN Outcome: Progressing 01/31/2024 1948 by Cena Almarie NOVAK, RN Outcome: Progressing 01/31/2024 1948 by Cena Almarie NOVAK, RN Outcome: Progressing   Problem: Coping: Goal: Level of anxiety will decrease 01/31/2024 1948  by Cena Almarie NOVAK, RN Outcome: Progressing 01/31/2024 1948 by Cena Almarie NOVAK, RN Outcome: Progressing 01/31/2024 1948 by Cena Almarie NOVAK, RN Outcome: Progressing   Problem: Elimination: Goal: Will not experience complications related to bowel motility 01/31/2024 1948 by Cena Almarie NOVAK, RN Outcome: Progressing 01/31/2024 1948 by Cena Almarie NOVAK, RN Outcome: Progressing 01/31/2024 1948 by Cena Almarie NOVAK, RN Outcome: Progressing Goal: Will not experience complications related to urinary retention 01/31/2024 1948 by Cena Almarie NOVAK, RN Outcome: Progressing 01/31/2024 1948 by Cena Almarie NOVAK, RN Outcome: Progressing 01/31/2024 1948 by Cena Almarie NOVAK, RN Outcome: Progressing   Problem: Pain Managment: Goal: General experience of comfort will improve and/or be controlled 01/31/2024 1948 by Cena Almarie NOVAK, RN Outcome: Progressing 01/31/2024 1948 by Cena Almarie NOVAK, RN Outcome: Progressing 01/31/2024 1948 by Cena Almarie NOVAK, RN Outcome: Progressing   Problem: Safety: Goal: Ability to remain free from injury will improve 01/31/2024 1948 by Cena Almarie NOVAK, RN Outcome: Progressing 01/31/2024 1948 by Cena Almarie NOVAK, RN Outcome: Progressing 01/31/2024 1948 by Cena Almarie NOVAK, RN Outcome: Progressing   Problem: Skin Integrity: Goal: Risk for impaired skin integrity will decrease 01/31/2024 1948 by Cena Almarie NOVAK, RN Outcome: Progressing 01/31/2024 1948 by Cena Almarie NOVAK, RN Outcome: Progressing 01/31/2024 1948 by Cena Almarie NOVAK, RN Outcome: Progressing

## 2024-01-31 NOTE — ED Notes (Addendum)
 Bolus of fluids NS 1000 NS given to patient before transfer due to his BP dropping and patient becoming more un-alert. Marsa, MD was notified of this decision.

## 2024-01-31 NOTE — Progress Notes (Signed)
   01/31/24 1600  Spiritual Encounters  Type of Visit Initial  Care provided to: Pt and family  Conversation partners present during encounter Nurse  Referral source Family  Reason for visit End-of-life  OnCall Visit No  Spiritual Framework  Presenting Themes Coping tools  Community/Connection Family  Patient Stress Factors Exhausted  Family Stress Factors Health changes;Lack of caregivers  Advance Directives (For Healthcare)  Does Patient Have a Medical Advance Directive? No  Would patient like information on creating a medical advance directive? No - Patient declined  Mental Health Advance Directives  Does Patient Have a Mental Health Advance Directive? No  Does patient want to make changes to mental health advance directive? No - Patient declined   Met with family and patient about next steps around loss and death. Offered support and compassion as PA discussed home care and DNR.   Nazier Neyhart Chaplain Intern-[facility]

## 2024-01-31 NOTE — Hospital Course (Addendum)
 Hospital course / significant events:   Russell [name] Maiden, 58 y.o. male with medical history significant of PVD status post right-sided BKA, left-sided AKA, left-sided forearm amputation, GI bleed secondary to peptic ulcer, chronic HFrEF with recovered LVEF, HTN, HLD, BPH, morbid obesity, presented with black tarry stool.   HPI: poorly controlled chronic back pain and  tailbone pain for which he has been taking OTC BC powders and last 3 days he has seen a large amount of black tarry stool but denied any epigastric pain no N/V, no CP, no lightheadedness or palpitations.   12/01: to ED. Hgb 6.0 (compared to 11.3 a month prior). GI consulted, PRBC transfusion underway  12/02: 4 unit PRBC and hypotensive. CTA ordered. Vasc consult. Afib RVR, cardiology consult. ICU consult and they have assumed care on pressors  Consultants:  Gastroenterology  Cardiology ICU Vascular surgery  Procedures/Surgeries: Central line place

## 2024-01-31 NOTE — ED Notes (Addendum)
 271 CBG.

## 2024-01-31 NOTE — Procedures (Signed)
 Central Venous Catheter Insertion Procedure Note  FLOR HOUDESHELL  [id number]  [birth year]  Date:01/31/24  Time:3:06 PM   Provider Performing:Kennadee Walthour Terrace   Procedure: Insertion of Non-tunneled Central Venous [id number]) with US  guidance (23062)   Indication(s) Medication administration and Difficult access  Consent Risks of the procedure as well as the alternatives and risks of each were explained to the patient and/or caregiver.  Consent for the procedure was obtained and is signed in the bedside chart  Anesthesia Topical only with 1% lidocaine    Timeout Verified patient identification, verified procedure, site/side was marked, verified correct patient position, special equipment/implants available, medications/allergies/relevant history reviewed, required imaging and test results available.  Sterile Technique Maximal sterile technique including full sterile barrier drape, hand hygiene, sterile gown, sterile gloves, mask, hair covering, sterile ultrasound probe cover (if used).  Procedure Description Area of catheter insertion was cleaned with chlorhexidine  and draped in sterile fashion.  With real-time ultrasound guidance a central venous catheter was placed into the right internal jugular vein. Nonpulsatile blood flow and easy flushing noted in all ports.  The catheter was sutured in place and sterile dressing applied.  Complications/Tolerance None; patient tolerated the procedure well. Chest X-ray is ordered to verify placement for internal jugular or subclavian cannulation.   Chest x-ray is not ordered for femoral cannulation.  EBL Minimal  Specimen(s) None   [place], PA-C Pulmonary/Critical Care PCCM Team Contact Info: [phone]

## 2024-01-31 NOTE — Progress Notes (Signed)
 Full progress note to follow  GIB, HH not improving after 2 unit PRBC, pending repeat after 4th unit PRBC. Afib RVR new onset now. Hx afib previous cardioversion  S:Pt reports hx afib s/p cardioversion, he states not on anything blood thinners at home. No chest pain now.    O: BP (!) 115/57 (BP Location: Left Arm)   Pulse 86   Temp 98 F (36.7 C) (Oral)   Resp 11   Wt 123.8 kg   SpO2 100%   BMI 38.08 kg/m  Tele (+)Afib RVR Irreg tachycardic Obesity limits exam lungs clear anterior Pt alerts to voice, but somnolent, briefly answers questions and then falls back to sleep   A/P:  Afib RVR and active GIB --> risk for low output heart failure, high risk hypotensive shock  Cardiology has seen pt --> amio, echo, CXR and BNP since difficult to examine for volume overload CHF and no recent echo ICU to consult, high risk shock --> Dr Parris aware GI aware --> consider CTA possible embolization, will work on getting rate control right now, will alert vascular team as heads up  Blood bank to hold additional 2 units ahead           CRITICAL CARE Performed by: Laneta Blunt   Total critical care time: 35 minutes  Critical care time was exclusive of separately billable procedures and treating other patients.  Critical care was necessary to treat or prevent imminent or life-threatening deterioration.  Critical care was time spent personally by me on the following activities: development of treatment plan with patient and/or surrogate as well as nursing, discussions with consultants, evaluation of patient's response to treatment, examination of patient, obtaining history from patient or surrogate, ordering and performing treatments and interventions, ordering and review of laboratory studies, ordering and review of radiographic studies, pulse oximetry and re-evaluation of patient's condition.

## 2024-01-31 NOTE — Plan of Care (Signed)

## 2024-01-31 NOTE — ED Notes (Addendum)
 Pt called out stating he had a bowel movent. Pt found to be sitting in very large, dark black puddle of stool. Pt cleaned, linens changed, and patient positioned for comfort. Call bell within reach, bed in low locked position. Dr. Devon paged at this time and made aware of concerning amount of black stool and of pt's hgb of 5.9.

## 2024-01-31 NOTE — Consult Note (Signed)
 NAME:  Russell [name], MRN:  [id number], DOB:  07/25/1965, LOS: 1 ADMISSION DATE:  01/30/2024, CONSULTATION DATE:  01/31/2024 REFERRING MD:  Dr. Laneta Blunt, CHIEF COMPLAINT:  GI bleed   History of Present Illness:  Russell [name] is a 58 year old male with a medical history significant for peripheral vascular disease s/p right sided BKA, left sided AKA, left sided forearm amputation, chronic HFrEF with recovered LVEF, Type II Diabetes Mellitus, BPH, hypertension, hyperlipidemia and a GI bleed secondary to peptic ulcer, who presented to [facility] ED with concern for black tarry stool. At time of arrival he had complaints of tailbone pain, reporting he has been taking [employer] powder for 3 days. Upon arrival he was hemodynamically stable with a  hemoglobin was 6.0.   ED Course Vitals on Arrival: Temp: 98.4, BP 152/77, HR 78, RR 21, SpO2 100%   Pertinent Diagnostics/Labs: Hgb: 6.0 RBC: 2.83 HCT: 19.2 Sodium: 127 CO2: 20 Glucose: 613  Medications Administered: Dilaudid  1mg  IV >> Dilaudid  0.5mg  >> 1mg  >> 0.5mg  Protonix  80mg  LR Bolus NaCl infusion Novolog  + Semglee  Baclofen  Lyrica  150mg  Melatonin 5mg  Finasteride  Flomas Lasix  80mg  Aspirin  81mg  Amiodarone  bolus 150mg  Amiodarone  gtt 4 units pRBCs  Pertinent Medical History  Paroxysmal Atrial Fibrillation Chronic Diastolic Heart Failure Right BKA, Left AKA Left forearm amputation Hx of GIB s/t peptic ulcer  Significant Hospital Events: Including procedures, antibiotic start and stop dates in addition to other pertinent events   01/31/24: H&H not improving after 2 units pRBCs, pending repeat after 4th unit pRBCs. Developed afib rvr. Placed on amiodarone  gtt. Later developed hypotension requiring levophed  gtt. Admitted to the ICU.  Interim History / Subjective:  See above listed under significant hospital events.  Objective    Blood pressure (!) 81/54, pulse (!) 101, temperature 98 F (36.7 C), temperature  source Oral, resp. rate 11, weight 123.8 kg, SpO2 97%.        Intake/Output Summary (Last 24 hours) at 01/31/2024 1304 Last data filed at 01/30/2024 1831 Gross per 24 hour  Intake 315 ml  Output 2300 ml  Net -1985 ml   Filed Weights   01/30/24 0813  Weight: 123.8 kg    Examination: General: ill appearing male, lying in bed, reporting significant pain and very emotional HENT: atraumatic, normocephalic, supple, no jvd Lungs: clear to auscultation bilaterally, equal throughout Cardiovascular: irregularly irregular, S1 S2, no m/r/g Abdomen: soft, mild distention, tenderness of left side, no rebound/guarding, bowel sounds x 4 Extremities: left forearm amputation, right leg BKA, left left AKA; all well perfused with femoral pulses present, radial pulses 2+, no edema Neuro: alert and oriented x 4 but requires redirection, no focal neuro deficits, follows commands, PERRL GU: deferred  Resolved problem list   Assessment and Plan   #Acute Metabolic Encephalopathy #Bipolar Disorder #Chronic Pain Hx: Bipolar Disorder, Anxiety, Depression - Correct metabolic derangements - Avoid sedating medications as able - Encourage family support at bedside - Control pain as able with prn Dilaudid   #Acute Hypoxic Respiratory Failure s/t Hypovolemic Shock - Supplemental oxygen to maintain sats >92% - Currently on 4L nasal cannula - Duonebs prn - CXR as indicated  #Atrial Fibrillation with RVR #Hypovolemic Shock s/t acute GIB Hx: Acute on Chronic Diastolic CHF, Afib with previous cardioversion - Continuous cardiac monitoring - Vasopressors to maintain MAP goal >65 - Started on levophed  gtt - Wean pressors as able - Continue amiodarone  gtt - IV lasix  80 mg x 1 - ECHO pending - Cardiology following; appreciate input  #  Acute Metabolic Acidosis s/t Hemorrhagic Shock with Lactic Acidosis #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 #Hyponatremia Hx: CKD Stage 4 - Strict I/O - Trend BMP and monitor renal  function - Avoid nephrotoxins as able - Ensure renal perfusion - ICU electrolyte replacement protocol  - Pharmacy to assist with replacement - Trend lactic - ABG as indicated  #Acute Gastrointestinal Bleed Hx: Gastric Ulcers - Continue PPI - Diet: NPO - Constipation protocol prn - Antiemetics prn - Vascular following; appreciate input - CTA Abdomen and Pelvis to localize bleeding source with possible embolization   #Acute Blood Loss Anemia s/t GI Bleed - Trend CBC  - Trend H&H - Hgb 7.1 - Transfuse if <7; will receive another unit pRBCs to make 5 total - Monitor for s/sx of bleeding - Hold AC for now   #Type II Diabetes Mellitus #DKA ~ Resolved - ICU hypo/hyperglycemia protocol - SSI - CBG Q4h - AG closed, no longer requiring insulin  gtt  #Leukocytosis likely reactive in setting of Shock - Trend WBC and monitor fever curve - Blood cultures pending - Hold off on abx for now  12/2: Family updated with plan of care in the conference room.  Labs   CBC: Recent Labs  Lab 01/26/24 1525 01/27/24 1734 01/30/24 0808 01/30/24 1512 01/30/24 2220 01/31/24 0450  WBC 8.0 8.4 8.0  --  9.3 11.9*  NEUTROABS 6.3 6.4 5.1  --   --   --   HGB 11.3* 11.3* 6.0* 6.1* 5.9* 6.3*  HCT 36.7* 36.2* 19.2* 19.1* 18.7* 19.5*  MCV 67.5* 67.4* 67.8*  --  71.9* 73.3*  PLT 230 235 209  --  223 216    Basic Metabolic Panel: Recent Labs  Lab 01/26/24 1525 01/27/24 1734 01/30/24 0808 01/30/24 2220 01/31/24 0317  NA 132* 132* 127* 132* 131*  K 4.4 4.3 4.6 4.7 4.7  CL 98 97* 96* 101 102  CO2 25 23 20* 21* 20*  GLUCOSE 463* 381* 613* 446* 379*  BUN 16 16 62* 59* 63*  CREATININE 0.72 0.78 1.14 1.02 1.14  CALCIUM  9.2 9.0 8.3* 8.1* 7.9*   GFR: Estimated Creatinine Clearance: 93.5 mL/min (by C-G formula based on SCr of 1.14 mg/dL). Recent Labs  Lab 01/27/24 1734 01/30/24 0808 01/30/24 2220 01/31/24 0450  WBC 8.4 8.0 9.3 11.9*  LATICACIDVEN 1.1  --   --   --     Liver Function  Tests: Recent Labs  Lab 01/26/24 1525 01/27/24 1734  AST 16 18  ALT 13 12  ALKPHOS 169* 151*  BILITOT 0.4 0.3  PROT 7.4 7.3  ALBUMIN  3.5 3.6   No results for input(s): LIPASE, AMYLASE in the last 168 hours. No results for input(s): AMMONIA in the last 168 hours.  ABG    Component Value Date/Time   PHART 7.48 (H) 08/09/2016 1559   PCO2ART 46 08/09/2016 1559   PO2ART 75 (L) 08/09/2016 1559   HCO3 28.5 (H) 01/27/2024 1746   ACIDBASEDEF 3.5 (H) 10/13/2022 0516   O2SAT 90.5 01/27/2024 1746     Coagulation Profile: Recent Labs  Lab 01/30/24 0942  INR 1.1    Cardiac Enzymes: No results for input(s): CKTOTAL, CKMB, CKMBINDEX, TROPONINI in the last 168 hours.  HbA1C: Hemoglobin A1C  Date/Time Value Ref Range Status  06/07/2014 04:16 AM 10.4 (H) % Final    Comment:    4.0-6.0 NOTE: New Reference Range  05/07/14   06/05/2014 10:41 PM 10.4 (H) % Final    Comment:    4.0-6.0 NOTE:  New Reference Range  05/07/14    Hgb A1c MFr Bld  Date/Time Value Ref Range Status  09/06/2023 10:20 AM 10.1 (H) 4.8 - 5.6 % Final    Comment:    (NOTE) Diagnosis of Diabetes The following HbA1c ranges recommended by the American Diabetes Association (ADA) may be used as an aid in the diagnosis of diabetes mellitus.  Hemoglobin             Suggested A1C NGSP%              Diagnosis  <5.7                   Non Diabetic  5.7-6.4                Pre-Diabetic  >6.4                   Diabetic  <7.0                   Glycemic control for                       adults with diabetes.    10/13/2022 02:29 AM 12.0 (H) 4.8 - 5.6 % Final    Comment:    (NOTE)         Prediabetes: 5.7 - 6.4         Diabetes: >6.4         Glycemic control for adults with diabetes: <7.0     CBG: Recent Labs  Lab 01/30/24 1152 01/30/24 1601 01/30/24 2112 01/31/24 0810 01/31/24 1129  GLUCAP 446* 445* 407* 303* 271*    Review of Systems:   Endorses left sided abdominal pain and  sacral pain. Denies chest pain, palpitations, shortness of breath, headache, vision changes, nausea or vomiting.   Past Medical History:  He,  has a past medical history of Anxiety, CHF (congestive heart failure) (HCC), Chronic diastolic (congestive) heart failure (HCC), Depression, Diabetes mellitus without complication (HCC), Hyperlipemia, Hypertension, Morbid obesity (HCC), and Peripheral edema.   Surgical History:   Past Surgical History:  Procedure Laterality Date   AMPUTATION Left 09/07/2023   Procedure: AMPUTATION, ABOVE KNEE;  Surgeon: Marea Selinda RAMAN, MD;  Location: [facility] ORS;  Service: General;  Laterality: Left;   APPENDECTOMY     BELOW KNEE LEG AMPUTATION     Right leg   BIOPSY  10/14/2022   Procedure: BIOPSY;  Surgeon: Aundria, Ladell POUR, MD;  Location: [facility] ENDOSCOPY;  Service: Gastroenterology;;   COLONOSCOPY N/A 10/16/2022   Procedure: COLONOSCOPY;  Surgeon: Therisa Bi, MD;  Location: [facility] ENDOSCOPY;  Service: Gastroenterology;  Laterality: N/A;   ESOPHAGOGASTRODUODENOSCOPY (EGD) WITH PROPOFOL  N/A 10/26/2022   Procedure: ESOPHAGOGASTRODUODENOSCOPY (EGD) WITH PROPOFOL ;  Surgeon: Maryruth Ole DASEN, MD;  Location: [facility] ENDOSCOPY;  Service: Endoscopy;  Laterality: N/A;   ESOPHAGOGASTRODUODENOSCOPY (EGD) WITH PROPOFOL  N/A 10/14/2022   Procedure: ESOPHAGOGASTRODUODENOSCOPY (EGD) WITH PROPOFOL ;  Surgeon: Toledo, Ladell POUR, MD;  Location: [facility] ENDOSCOPY;  Service: Gastroenterology;  Laterality: N/A;   INCISION AND DRAINAGE ABSCESS Left 02/27/2023   Procedure: INCISION AND DRAINAGE ABSCESS LEFT FINGER AND ARM;  Surgeon: Robbin Redell Ned, MD;  Location: [facility] ORS;  Service: Orthopedics;  Laterality: Left;   TONSILLECTOMY       Social History:   reports that he has never smoked. He has never used smokeless tobacco. He reports that he does not drink alcohol and does not use drugs.   Family History:  His family history includes CAD in his father; COPD in his mother; Diabetes in his  father; Kidney cancer in his brother.   Allergies Allergies  Allergen Reactions   Fluoxetine     Other reaction(s): Hallucination   Shellfish Allergy Hives   Sulfa Antibiotics Hives   Vancomycin      Other reaction(s): Red Man Syndrome   Metformin Nausea Only     Home Medications  Prior to Admission medications   Medication Sig Start Date End Date Taking? Authorizing Provider  amoxicillin -clavulanate (AUGMENTIN ) 875-125 MG tablet Take 1 tablet by mouth 2 (two) times daily for 7 days. 01/26/24 02/02/24 Yes Ray, Nilsa, MD  aspirin  EC 81 MG tablet Take 81 mg by mouth.  Take 1 tablet (81 mg total) by mouth once daily 04/22/23 04/22/24 Yes [provider]  atorvastatin  (LIPITOR) 40 MG tablet Take 40 mg by mouth daily.    Yes [provider]  baclofen  (LIORESAL ) 10 MG tablet Take 15 mg by mouth every 8 (eight) hours. 06/30/23  Yes [provider]  cetirizine (ZYRTEC) 10 MG tablet Take 10 mg by mouth daily as needed for allergies. 06/23/20  Yes [provider]  DULoxetine  (CYMBALTA ) 30 MG capsule Take 90 mg by mouth daily.   Yes [provider]  finasteride  (PROSCAR ) 5 MG tablet Take 5 mg by mouth daily. 08/17/23  Yes [provider]  furosemide  (LASIX ) 80 MG tablet Take 80 mg by mouth 2 (two) times daily. 12/08/22  Yes [provider]  gabapentin  (NEURONTIN ) 600 MG tablet Take 600 mg by mouth 3 (three) times daily. 06/30/23  Yes [provider]  insulin  degludec (TRESIBA) 200 UNIT/ML FlexTouch Pen Inject 20 Units into the skin at bedtime. 11/11/23  Yes [provider]  insulin  glargine, 1 Unit Dial, (TOUJEO  SOLOSTAR) 300 UNIT/ML Solostar Pen Inject 20 Units into the skin at bedtime.   Yes [provider]  insulin  lispro (HUMALOG) 100 UNIT/ML KwikPen Inject 20 Units into the skin 3 (three) times daily. 11/11/23 11/10/24 Yes [provider]  loperamide (IMODIUM A-D) 2 MG tablet Take 2 mg by mouth as needed for  diarrhea or loose stools.   Yes [provider]  melatonin 5 MG TABS Take 1 tablet (5 mg total) by mouth at bedtime. 11/16/22  Yes Awanda City, MD  metoprolol  succinate (TOPROL -XL) 50 MG 24 hr tablet Take 50 mg by mouth at bedtime. 01/05/24  Yes [provider]  MOUNJARO 2.5 MG/0.5ML Pen Inject 2.5 mg into the skin once a week. 11/11/23  Yes [provider]  MOVANTIK 25 MG TABS tablet Take 25 mg by mouth daily. 10/18/23  Yes [provider]  nitrofurantoin  (MACRODANTIN ) 50 MG capsule Take 50 mg by mouth every 6 (six) hours. 10/26/23  Yes [provider]  oxyCODONE  (ROXICODONE ) 15 MG immediate release tablet Take 15 mg by mouth every 6 (six) hours as needed.   Yes [provider]  oxyCODONE  (ROXICODONE ) 15 MG immediate release tablet Take 1 tablet (15 mg total) by mouth every 6 (six) hours as needed for pain. 10/09/23  Yes Dorothyann Drivers, MD  potassium chloride  (MICRO-K ) 10 MEQ CR capsule Take by mouth 2 (two) times daily. :1 Tablet(s) By Mouth Twice Daily   Yes [provider]  pregabalin  (LYRICA ) 150 MG capsule Take 150 mg by mouth 3 (three) times daily. 08/15/23  Yes [provider]  senna (SENOKOT) 8.6 MG TABS tablet Take 2 tablets by mouth daily.   Yes [provider]  senna-docusate (SENOKOT-S) 8.6-50 MG tablet Take 2 tablets by mouth 2 (two) times daily. 09/16/23  Yes [provider]  tamsulosin  (FLOMAX ) 0.4 MG CAPS capsule Take 0.8 mg by mouth daily.   Yes [provider]  acetaminophen  (TYLENOL ) 325 MG tablet Take 2 tablets (650 mg total) by mouth every 6 (six) hours as needed for mild pain (pain score 1-3) (or Fever >/= 101). 03/02/23   Barbarann Nest, MD  bisacodyl  (DULCOLAX) 10 MG suppository Place 10 mg rectally daily as needed for moderate constipation.    [provider]  buPROPion  (WELLBUTRIN  XL) 150 MG 24 hr tablet Take 1 tablet (150 mg total) by mouth daily. 11/06/22 10/06/23  Trudy Anthony HERO, MD  diclofenac Sodium (VOLTAREN) 1 % GEL Apply 2 g topically daily as needed (for pain).    [provider]  diphenhydrAMINE  (BENADRYL ) 25 mg capsule Take 1 capsule (25 mg total) by mouth every 6 (six) hours as needed for allergies or itching (30 minutes prior to starting Vancomycin ). Patient not taking: Reported on 10/06/2023 03/02/23   Barbarann Nest, MD  HUMULIN  R 100 UNIT/ML injection Inject 0.1 mLs (10 Units total) into the skin 3 (three) times daily with meals. 11/16/22   Awanda City, MD  mupirocin  ointment (BACTROBAN ) 2 % Apply to penis topically as needed.    [provider]  oxymetazoline  (AFRIN) 0.05 % nasal spray Place 1 spray into both nostrils 2 (two) times daily as needed for congestion.    [provider]  polyethylene glycol (MIRALAX  / GLYCOLAX ) 17 g packet Take 17 g by mouth daily as needed. 11/16/22   Awanda City, MD     Critical care time: 75 minutes      Webb Weed, PA-C [facility] P[facility] Team Contact Info: [phone]

## 2024-01-31 NOTE — IPAL (Signed)
  Interdisciplinary Goals of Care Family Meeting   Date carried out: 01/31/2024  Location of the meeting: Conference room  Member's involved: Family Member or next of kin and Other: Physician Assistant  Discussion: We discussed goals of care for Russell [name] .  Discussed at length the status of the patient's critical illness. Family was updated regarding his current status and plan of care. His wife, mother and brother were present for the conversation. It was expressed that they do not want to see him suffer. Given his extensive medical history and his previously expressed wishes, it was decided to change his code status to DNR/DNI. All questions were answered for each family member and they all agreed to the goals of care.   Code status:   Code Status: Limited: Do not attempt resuscitation (DNR) -DNR-LIMITED -Do Not Intubate/DNI    Disposition: Continue current acute care  Time spent for the meeting: 60 minutes    Robet Kim, PA-C  01/31/2024, 5:12 PM

## 2024-01-31 NOTE — ED Notes (Signed)
 RN notified provider of pt rate and rhythm change.

## 2024-01-31 NOTE — Progress Notes (Signed)
 PROGRESS NOTE    [name]   FMW:[id number] DOB: 03/06/1965  DOA: 01/30/2024 Date of Service: 01/31/24 which is hospital day 1  PCP: Eliverto [name], MD    Hospital course / significant events:   Russell [name] is a 58 y.o. male with medical history significant of PVD status post right-sided BKA, left-sided AKA, left-sided forearm amputation, GI bleed secondary to peptic ulcer, chronic HFrEF with recovered LVEF, HTN, HLD, BPH, morbid obesity, presented with black tarry stool.   HPI: poorly controlled chronic back pain and  tailbone pain for which he has been taking OTC BC powders and last 3 days he has seen a large amount of black tarry stool but denied any epigastric pain no N/V, no CP, no lightheadedness or palpitations.   12/01: to ED. Hgb 6.0 (compared to 11.3 a month prior). GI consulted, PRBC transfusion underway  12/02: 4 unit PRBC and hypotensive. CTA ordered. Vasc consult. Afib RVR, cardiology consult. ICU consult and they have assumed care on pressors     Consultants:  Gastroenterology  Cardiology ICU Vascular surgery  Procedures/Surgeries: Central line place       ASSESSMENT & PLAN:   Hematochezia suspect ABLA d/t recurrent duodenal ulcer d/t NSAIDs S/p 4 units PRBC S/p Venofer  500 mg  To ICU now on pressors   IDDM with  Hyperglycemia d/t nonadherence to insulin  regimen SSI   OSA CPAP at bedtime   History of chronic HFrEF with normalized LVEF CAD Volume up Cardiology following  home regimen of metoprolol  to as needed Lopressor  d/t low BP Statin Hold ASA   BPH Continue Flomax  and Proscar   Chronic pain Chronic opiate use  IV dilaudid  here prn but holding for low BP Hold home oxycodone  Hold home lyrica   Chronic constipation Opioid induced constipation  Prn bowel regimen  Mental health  ?wellbutrin  need to confirm if taking  ?cymbalta  need to confirm if taking    Class 2 obesity based on BMI: Body mass index is 38.08  kg/m.SABRA Significantly low or high BMI is associated with higher medical risk.  Underweight - under 18  overweight - 25 to 29 obese - 30 or more Class 1 obesity: BMI of 30.0 to 34 Class 2 obesity: BMI of 35.0 to 39 Class 3 obesity: BMI of 40.0 to 49 Super Morbid Obesity: BMI 50-59 Super-super Morbid Obesity: BMI 60+ Healthy nutrition and physical activity advised as adjunct to other disease management and risk reduction treatments              Subjective / Brief ROS:  Patient reports back pain Denies CP/SOB, denies dizziness Pain controlled.  Denies new weakness. CLD today and tolerated this.   Family Communication: ICU spoke w/ family     Objective Findings:  Vitals:   01/31/24 0928 01/31/24 1130 01/31/24 1210 01/31/24 1400  BP: (!) 115/57 95/60 (!) 81/54 (!) 128/93  Pulse: 86 (!) 101  (!) 134  Resp: 11 11  17   Temp: 98 F (36.7 C)   98.2 F (36.8 C)  TempSrc: Oral   Axillary  SpO2: 100% 97%  100%  Weight:        Intake/Output Summary (Last 24 hours) at 01/31/2024 1640 Last data filed at 01/30/2024 1831 Gross per 24 hour  Intake 315 ml  Output 800 ml  Net -485 ml   Filed Weights   01/30/24 0813  Weight: 123.8 kg    Examination:  Physical Exam Constitutional:      Comments: Alerts to voice  and answers coherently but falls right back to sleep. Reports back pain   Cardiovascular:     Rate and Rhythm: Tachycardia present. Rhythm irregular.  Pulmonary:     Comments: Occasional apneic Abdominal:     General: Bowel sounds are normal. There is no distension.  Skin:    Coloration: Skin is pale.  Neurological:     Mental Status: He is oriented to person, place, and time.  Psychiatric:        Behavior: Behavior normal.          Scheduled Medications:   sodium chloride    Intravenous Once   sodium chloride    Intravenous Once   atorvastatin   40 mg Oral Daily   baclofen   15 mg Oral Q8H   Chlorhexidine  Gluconate Cloth  6 each Topical Daily    finasteride   5 mg Oral Daily   insulin  aspart  0-20 Units Subcutaneous Q4H   insulin  glargine-yfgn  20 Units Subcutaneous QHS   melatonin  5 mg Oral QHS   pantoprazole  (PROTONIX ) IV  40 mg Intravenous Q12H   pregabalin   150 mg Oral TID   tamsulosin   0.8 mg Oral Daily    Continuous Infusions:  sodium chloride      amiodarone  60 mg/hr (01/31/24 1506)   amiodarone      norepinephrine  (LEVOPHED ) Adult infusion 12 mcg/min (01/31/24 1515)    PRN Medications:  bisacodyl , HYDROmorphone  (DILAUDID ) injection, iohexol , loperamide, metoprolol  tartrate, ondansetron  **OR** ondansetron  (ZOFRAN ) IV, oxyCODONE , senna-docusate  Antimicrobials from admission:  Anti-infectives (From admission, onward)    None           Data Reviewed:  I have personally reviewed the following...  CBC: Recent Labs  Lab 01/26/24 1525 01/27/24 1734 01/30/24 0808 01/30/24 1512 01/30/24 2220 01/31/24 0450 01/31/24 1302  WBC 8.0 8.4 8.0  --  9.3 11.9*  --   NEUTROABS 6.3 6.4 5.1  --   --   --   --   HGB 11.3* 11.3* 6.0* 6.1* 5.9* 6.3* 7.1*  HCT 36.7* 36.2* 19.2* 19.1* 18.7* 19.5* 21.2*  MCV 67.5* 67.4* 67.8*  --  71.9* 73.3*  --   PLT 230 235 209  --  223 216  --    Basic Metabolic Panel: Recent Labs  Lab 01/26/24 1525 01/27/24 1734 01/30/24 0808 01/30/24 2220 01/31/24 0317  NA 132* 132* 127* 132* 131*  K 4.4 4.3 4.6 4.7 4.7  CL 98 97* 96* 101 102  CO2 25 23 20* 21* 20*  GLUCOSE 463* 381* 613* 446* 379*  BUN 16 16 62* 59* 63*  CREATININE 0.72 0.78 1.14 1.02 1.14  CALCIUM  9.2 9.0 8.3* 8.1* 7.9*   GFR: Estimated Creatinine Clearance: 93.5 mL/min (by C-G formula based on SCr of 1.14 mg/dL). Liver Function Tests: Recent Labs  Lab 01/26/24 1525 01/27/24 1734  AST 16 18  ALT 13 12  ALKPHOS 169* 151*  BILITOT 0.4 0.3  PROT 7.4 7.3  ALBUMIN  3.5 3.6   No results for input(s): LIPASE, AMYLASE in the last 168 hours. No results for input(s): AMMONIA in the last 168 hours. Coagulation  Profile: Recent Labs  Lab 01/30/24 0942  INR 1.1   Cardiac Enzymes: No results for input(s): CKTOTAL, CKMB, CKMBINDEX, TROPONINI in the last 168 hours. BNP (last 3 results) Recent Labs    01/31/24 1135  PROBNP 104.0   HbA1C: No results for input(s): HGBA1C in the last 72 hours. CBG: Recent Labs  Lab 01/30/24 1601 01/30/24 2112 01/31/24 0810 01/31/24 1129 01/31/24 1244  GLUCAP 445* 407* 303* 271* 281*   Lipid Profile: No results for input(s): CHOL, HDL, LDLCALC, TRIG, CHOLHDL, LDLDIRECT in the last 72 hours. Thyroid  Function Tests: No results for input(s): TSH, T4TOTAL, FREET4, T3FREE, THYROIDAB in the last 72 hours. Anemia Panel: Recent Labs    01/30/24 0808  FERRITIN 26  TIBC 280  IRON  26*  RETICCTPCT 2.3   Most Recent Urinalysis On File:     Component Value Date/Time   COLORURINE COLORLESS (A) 01/30/2024 1600   APPEARANCEUR CLEAR (A) 01/30/2024 1600   APPEARANCEUR Clear 08/13/2013 1624   LABSPEC 1.016 01/30/2024 1600   LABSPEC 1.012 08/13/2013 1624   PHURINE 5.0 01/30/2024 1600   GLUCOSEU >=500 (A) 01/30/2024 1600   GLUCOSEU 150 mg/dL 93/84/7984 8375   HGBUR NEGATIVE 01/30/2024 1600   BILIRUBINUR NEGATIVE 01/30/2024 1600   BILIRUBINUR Negative 08/13/2013 1624   KETONESUR NEGATIVE 01/30/2024 1600   PROTEINUR NEGATIVE 01/30/2024 1600   NITRITE NEGATIVE 01/30/2024 1600   LEUKOCYTESUR NEGATIVE 01/30/2024 1600   LEUKOCYTESUR Negative 08/13/2013 1624   Sepsis Labs: @LABRCNTIP (procalcitonin:4,lacticidven:4) Microbiology: Recent Results (from the past 240 hours)  Blood culture (routine x 2)     Status: None (Preliminary result)   Collection Time: 01/27/24  5:34 PM   Specimen: BLOOD  Result Value Ref Range Status   Specimen Description BLOOD LEFT ANTECUBITAL  Final   Special Requests   Final    BOTTLES DRAWN AEROBIC AND ANAEROBIC Blood Culture adequate volume   Culture   Final    NO GROWTH 4 DAYS Performed at [facility], [address] [place], [place] [address]    Report Status PENDING  Incomplete  Blood culture (routine x 2)     Status: None (Preliminary result)   Collection Time: 01/27/24  5:39 PM   Specimen: BLOOD  Result Value Ref Range Status   Specimen Description BLOOD BLOOD RIGHT HAND  Final   Special Requests   Final    BOTTLES DRAWN AEROBIC AND ANAEROBIC Blood Culture results may not be optimal due to an inadequate volume of blood received in culture bottles   Culture   Final    NO GROWTH 4 DAYS Performed at [facility], [address], [place], [place] [address]    Report Status PENDING  Incomplete      Radiology Studies last 3 days: DG Chest Port 1 View Result Date: 01/31/2024 EXAM: 1 VIEW(S) XRAY OF THE CHEST 01/31/2024 03:05:27 PM COMPARISON: None available. CLINICAL HISTORY: Encounter for central line placement. FINDINGS: LINES, TUBES AND DEVICES: Interval placement of right internal jugular catheter with distal tip in expected position of SVC. LUNGS AND PLEURA: No focal pulmonary opacity. No pleural effusion. No pneumothorax. HEART AND MEDIASTINUM: The distal tip of the right internal jugular catheter is in the expected position of the SVC. No acute abnormality of the cardiac and mediastinal silhouettes. BONES AND SOFT TISSUES: No acute osseous abnormality. IMPRESSION: 1. Right internal jugular catheter with the distal tip in the expected position within the superior vena cava. Electronically signed by: Lynwood Seip MD 01/31/2024 03:26 PM EST RP Workstation: [id number]   DG Chest Port 1 View Result Date: 01/31/2024 EXAM: 1 VIEW(S) XRAY OF THE CHEST 01/31/2024 10:43:58 AM COMPARISON: 09/06/2023 CLINICAL HISTORY: SOB (shortness of breath); Atrial fibrillation with RVR (HCC) FINDINGS: LUNGS AND PLEURA: Low lung volumes. No focal pulmonary opacity. No pleural effusion. No pneumothorax. HEART AND MEDIASTINUM: No acute abnormality of the cardiac and mediastinal silhouettes.  BONES AND SOFT TISSUES: No acute osseous  abnormality. IMPRESSION: 1. Low lung volumes, No acute cardiopulmonary process. Electronically signed by: Dayne Hassell MD 01/31/2024 10:58 AM EST RP Workstation: [id number]         Laneta Blunt, DO [facility] 01/31/2024, 4:40 PM    Dictation software may have been used to generate the above note. Typos may occur and escape review in typed/dictated notes. Please contact Dr Blunt directly for clarity if needed.  Staff may message me via secure chat in Epic  but this may not receive an immediate response,  please page me for urgent matters!  If 7PM-7AM, please contact night coverage [email or web address]

## 2024-01-31 NOTE — Consult Note (Signed)
 Hospital Consult    Reason for Consult:  GI Bleeding with black tarry stools. Requesting Physician:  Dr Laneta Blunt MD  MRN #:  [id number]  History of Present Illness: This is a 58 y.o. male with medical history significant of PVD status post right-sided BKA, left-sided AKA, left-sided forearm amputation, GI bleed secondary to peptic ulcer, chronic HFrEF with recovered LVEF, HTN, HLD, BPH, morbid obesity, presented with black tarry stool. He complains of tailbone pain in which he has been taking BC Goodies Powder for the past 3 days. He started having black tarry stools and presented to [facility] emergency room. Upon work up he was found to have a HGB of 6.0 and was ordered 4 units of Packed red blood cells.   Past Medical History:  Diagnosis Date   Anxiety    CHF (congestive heart failure) (HCC)    EF 30-35%   Chronic diastolic (congestive) heart failure (HCC)    Depression    Diabetes mellitus without complication (HCC)    Hyperlipemia    Hypertension    Morbid obesity (HCC)    Peripheral edema     Past Surgical History:  Procedure Laterality Date   AMPUTATION Left 09/07/2023   Procedure: AMPUTATION, ABOVE KNEE;  Surgeon: Marea Selinda RAMAN, MD;  Location: [facility] ORS;  Service: General;  Laterality: Left;   APPENDECTOMY     BELOW KNEE LEG AMPUTATION     Right leg   BIOPSY  10/14/2022   Procedure: BIOPSY;  Surgeon: Aundria, Ladell POUR, MD;  Location: [facility] ENDOSCOPY;  Service: Gastroenterology;;   COLONOSCOPY N/A 10/16/2022   Procedure: COLONOSCOPY;  Surgeon: Therisa Bi, MD;  Location: [facility] ENDOSCOPY;  Service: Gastroenterology;  Laterality: N/A;   ESOPHAGOGASTRODUODENOSCOPY (EGD) WITH PROPOFOL  N/A 10/26/2022   Procedure: ESOPHAGOGASTRODUODENOSCOPY (EGD) WITH PROPOFOL ;  Surgeon: Maryruth Ole DASEN, MD;  Location: [facility] ENDOSCOPY;  Service: Endoscopy;  Laterality: N/A;   ESOPHAGOGASTRODUODENOSCOPY (EGD) WITH PROPOFOL  N/A 10/14/2022   Procedure: ESOPHAGOGASTRODUODENOSCOPY (EGD) WITH  PROPOFOL ;  Surgeon: Toledo, Ladell POUR, MD;  Location: [facility] ENDOSCOPY;  Service: Gastroenterology;  Laterality: N/A;   INCISION AND DRAINAGE ABSCESS Left 02/27/2023   Procedure: INCISION AND DRAINAGE ABSCESS LEFT FINGER AND ARM;  Surgeon: Robbin Redell Ned, MD;  Location: [facility] ORS;  Service: Orthopedics;  Laterality: Left;   TONSILLECTOMY      Allergies  Allergen Reactions   Fluoxetine     Other reaction(s): Hallucination   Shellfish Allergy Hives   Sulfa Antibiotics Hives   Vancomycin      Other reaction(s): Red Man Syndrome   Metformin Nausea Only    Prior to Admission medications   Medication Sig Start Date End Date Taking? Authorizing Provider  amoxicillin -clavulanate (AUGMENTIN ) 875-125 MG tablet Take 1 tablet by mouth 2 (two) times daily for 7 days. 01/26/24 02/02/24 Yes Ray, Nilsa, MD  aspirin  EC 81 MG tablet Take 81 mg by mouth.  Take 1 tablet (81 mg total) by mouth once daily 04/22/23 04/22/24 Yes [provider]  atorvastatin  (LIPITOR) 40 MG tablet Take 40 mg by mouth daily.    Yes [provider]  baclofen  (LIORESAL ) 10 MG tablet Take 15 mg by mouth every 8 (eight) hours. 06/30/23  Yes [provider]  cetirizine (ZYRTEC) 10 MG tablet Take 10 mg by mouth daily as needed for allergies. 06/23/20  Yes [provider]  DULoxetine  (CYMBALTA ) 30 MG capsule Take 90 mg by mouth daily.   Yes [provider]  finasteride  (PROSCAR ) 5 MG tablet Take 5 mg by mouth daily.  08/17/23  Yes [provider]  furosemide  (LASIX ) 80 MG tablet Take 80 mg by mouth 2 (two) times daily. 12/08/22  Yes [provider]  gabapentin  (NEURONTIN ) 600 MG tablet Take 600 mg by mouth 3 (three) times daily. 06/30/23  Yes [provider]  insulin  degludec (TRESIBA) 200 UNIT/ML FlexTouch Pen Inject 20 Units into the skin at bedtime. 11/11/23  Yes [provider]  insulin  glargine, 1 Unit Dial, (TOUJEO  SOLOSTAR) 300 UNIT/ML Solostar Pen Inject 20  Units into the skin at bedtime.   Yes [provider]  insulin  lispro (HUMALOG) 100 UNIT/ML KwikPen Inject 20 Units into the skin 3 (three) times daily. 11/11/23 11/10/24 Yes [provider]  loperamide (IMODIUM A-D) 2 MG tablet Take 2 mg by mouth as needed for diarrhea or loose stools.   Yes [provider]  melatonin 5 MG TABS Take 1 tablet (5 mg total) by mouth at bedtime. 11/16/22  Yes Awanda City, MD  metoprolol  succinate (TOPROL -XL) 50 MG 24 hr tablet Take 50 mg by mouth at bedtime. 01/05/24  Yes [provider]  MOUNJARO 2.5 MG/0.5ML Pen Inject 2.5 mg into the skin once a week. 11/11/23  Yes [provider]  MOVANTIK 25 MG TABS tablet Take 25 mg by mouth daily. 10/18/23  Yes [provider]  nitrofurantoin  (MACRODANTIN ) 50 MG capsule Take 50 mg by mouth every 6 (six) hours. 10/26/23  Yes [provider]  oxyCODONE  (ROXICODONE ) 15 MG immediate release tablet Take 15 mg by mouth every 6 (six) hours as needed.   Yes [provider]  oxyCODONE  (ROXICODONE ) 15 MG immediate release tablet Take 1 tablet (15 mg total) by mouth every 6 (six) hours as needed for pain. 10/09/23  Yes Dorothyann Drivers, MD  potassium chloride  (MICRO-K ) 10 MEQ CR capsule Take by mouth 2 (two) times daily. :1 Tablet(s) By Mouth Twice Daily   Yes [provider]  pregabalin  (LYRICA ) 150 MG capsule Take 150 mg by mouth 3 (three) times daily. 08/15/23  Yes [provider]  senna (SENOKOT) 8.6 MG TABS tablet Take 2 tablets by mouth daily.   Yes [provider]  senna-docusate (SENOKOT-S) 8.6-50 MG tablet Take 2 tablets by mouth 2 (two) times daily. 09/16/23  Yes [provider]  tamsulosin  (FLOMAX ) 0.4 MG CAPS capsule Take 0.8 mg by mouth daily.   Yes [provider]  acetaminophen  (TYLENOL ) 325 MG tablet Take 2 tablets (650 mg total) by mouth every 6 (six) hours as needed for mild pain (pain score 1-3) (or Fever >/= 101).  03/02/23   Barbarann Nest, MD  bisacodyl  (DULCOLAX) 10 MG suppository Place 10 mg rectally daily as needed for moderate constipation.    [provider]  buPROPion  (WELLBUTRIN  XL) 150 MG 24 hr tablet Take 1 tablet (150 mg total) by mouth daily. 11/06/22 10/06/23  Trudy Anthony HERO, MD  diclofenac Sodium (VOLTAREN) 1 % GEL Apply 2 g topically daily as needed (for pain).    [provider]  diphenhydrAMINE  (BENADRYL ) 25 mg capsule Take 1 capsule (25 mg total) by mouth every 6 (six) hours as needed for allergies or itching (30 minutes prior to starting Vancomycin ). Patient not taking: Reported on 10/06/2023 03/02/23   Barbarann Nest, MD  HUMULIN  R 100 UNIT/ML injection Inject 0.1 mLs (10 Units total) into the skin 3 (three) times daily with meals. 11/16/22   Awanda City, MD  mupirocin  ointment (BACTROBAN ) 2 % Apply to penis topically as needed.    [provider]  oxymetazoline  (AFRIN) 0.05 % nasal spray Place 1 spray into both nostrils 2 (two) times daily as needed for congestion.    [provider]  polyethylene glycol (MIRALAX  / GLYCOLAX ) 17 g packet Take 17 g by mouth daily as needed. 11/16/22   Awanda City, MD    Social History   Socioeconomic History   Marital status: Legally Separated    Spouse name: Not on file   Number of children: Not on file   Years of education: Not on file   Highest education level: Not on file  Occupational History   Not on file  Tobacco Use   Smoking status: Never   Smokeless tobacco: Never  Vaping Use   Vaping status: Never Used  Substance and Sexual Activity   Alcohol use: No   Drug use: No   Sexual activity: Not Currently  Other Topics Concern   Not on file  Social History Narrative   Living with mother now   Wheelchair bound at baseline   Social Drivers of Health   Financial Resource Strain: Low Risk  (04/04/2023)   [facility]   Overall Financial Resource Strain (CARDIA)    Difficulty of  Paying Living Expenses: Not hard at all  Food Insecurity: Unknown (09/07/2023)   Hunger Vital Sign    Worried About Running Out of Food in the Last Year: Not on file    Ran Out of Food in the Last Year: Patient declined  Transportation Needs: No Transportation Needs (09/07/2023)   PRAPARE - Administrator, Civil Service (Medical): No    Lack of Transportation (Non-Medical): No  Physical Activity: Not on file  Stress: Not on file  Social Connections: Not on file  Intimate Partner Violence: Not At Risk (09/07/2023)   Humiliation, Afraid, Rape, and Kick questionnaire    Fear of Current or Ex-Partner: No    Emotionally Abused: No    Physically Abused: No    Sexually Abused: No     Family History  Problem Relation Age of Onset   COPD Mother    Diabetes Father    CAD Father    Kidney cancer Brother     ROS: Otherwise negative unless mentioned in HPI  Physical Examination  Vitals:   01/31/24 0800 01/31/24 0928  BP: 126/68 (!) 115/57  Pulse: 83 86  Resp: 11 11  Temp: 98.2 F (36.8 C) 98 F (36.7 C)  SpO2: 100% 100%   Body mass index is 38.08 kg/m.  General:  WDWN in NAD Gait: Not observed HENT: WNL, normocephalic Pulmonary: normal non-labored breathing, without Rales, rhonchi,  wheezing Cardiac: irregular, tachycardic in th e140's, without  Murmurs, rubs or gallops; without carotid bruits Abdomen: Positive bowel sounds, soft, positive epigastric tenderness/ND, no masses Skin: without rashes Vascular Exam/Pulses: Patient HX of left AKA and right BKA. Also right forearm amputation. Femoral pulses are palpable.  Extremities: without ischemic changes, without Gangrene , without cellulitis; without open wounds;  Musculoskeletal: no muscle wasting or atrophy  Neurologic: A&O X 3;  No focal weakness or paresthesias are detected; speech is fluent/normal. Patient is morbidly Obese. Psychiatric:  The pt has Normal affect. Lymph:  Unremarkable  CBC    Component Value  Date/Time   WBC 11.9 (H) 01/31/2024 0450   RBC 2.66 (L) 01/31/2024 0450   HGB 6.3 (L) 01/31/2024 0450   HGB 12.6 (L) 06/13/2014 0421   HCT 19.5 (L) 01/31/2024 0450   HCT 39.0 (L)  06/13/2014 0421   PLT 216 01/31/2024 0450   PLT 190 06/13/2014 0421   MCV 73.3 (L) 01/31/2024 0450   MCV 80 06/13/2014 0421   MCH 23.7 (L) 01/31/2024 0450   MCHC 32.3 01/31/2024 0450   RDW 22.2 (H) 01/31/2024 0450   RDW 15.1 (H) 06/13/2014 0421   LYMPHSABS 2.2 01/30/2024 0808   LYMPHSABS 2.1 06/13/2014 0421   MONOABS 0.4 01/30/2024 0808   MONOABS 0.8 06/13/2014 0421   EOSABS 0.1 01/30/2024 0808   EOSABS 0.4 06/13/2014 0421   BASOSABS 0.0 01/30/2024 0808   BASOSABS 0.1 06/13/2014 0421    BMET    Component Value Date/Time   NA 131 (L) 01/31/2024 0317   NA 136 06/13/2014 0421   K 4.7 01/31/2024 0317   K 4.0 06/13/2014 0421   CL 102 01/31/2024 0317   CL 100 (L) 06/13/2014 0421   CO2 20 (L) 01/31/2024 0317   CO2 29 06/13/2014 0421   GLUCOSE 379 (H) 01/31/2024 0317   GLUCOSE 143 (H) 06/13/2014 0421   BUN 63 (H) 01/31/2024 0317   BUN 22 (H) 06/13/2014 0421   CREATININE 1.14 01/31/2024 0317   CREATININE 0.87 06/13/2014 0421   CALCIUM  7.9 (L) 01/31/2024 0317   CALCIUM  8.6 (L) 06/13/2014 0421   GFRNONAA >60 01/31/2024 0317   GFRNONAA >60 06/13/2014 0421   GFRAA >60 06/03/2018 0014   GFRAA >60 06/13/2014 0421    COAGS: Lab Results  Component Value Date   INR 1.1 01/30/2024   INR 1.4 (H) 09/06/2023   INR 1.4 (H) 09/06/2023     Non-Invasive Vascular Imaging:   CTA of the Abdomen and Pelvis Ordered.   Statin:  Yes.   Beta Blocker:  Yes.   Aspirin :  Yes.   ACEI:  No. ARB:  No. CCB use:  No Other antiplatelets/anticoagulants:  No.    ASSESSMENT/PLAN: This is a 58 y.o. male who presents to [facility] emergency department for tailbone pain and black tarry stools over the past 3 days. Patient was taking OTC Goodies Powder to help with his chronic back pain and tail bone pain.   Upon exam the  patient was tachycardic in the 140's with low blood pressures of 81/54. He arrived with a HGB of 6.0 and was ordered 4 units of packed red blood cells.    Vascular Surgery recommends stabilizing the patient and sending for a CTA of the abdomen and pelvis to isolate a bleeding source. If a source can be located we can discuss possible embolization at that time depending upon how stable the patient is to tolerate possible GI Embolization.    -I discussed the case in detail with Dr Cordella Shawl MD and he agrees with the plan.    [name] [facility] 01/31/2024 10:28 AM

## 2024-01-31 NOTE — Inpatient Diabetes Management (Signed)
 Inpatient Diabetes Program Recommendations  AACE/ADA: New Consensus Statement on Inpatient Glycemic Control (2015)  Target Ranges:  Prepandial:   less than 140 mg/dL      Peak postprandial:   less than 180 mg/dL (1-2 hours)      Critically ill patients:  140 - 180 mg/dL   Lab Results  Component Value Date   GLUCAP 303 (H) 01/31/2024   HGBA1C 10.1 (H) 09/06/2023    Review of Glycemic Control Diabetes history: DM2 Outpatient Diabetes medications:  Toujeo  20 units daily Humulin  Rn 10 units tid meal coverage Current orders for Inpatient glycemic control:  Semglee  20 units daily Novolog  0-15 units tid, 0-5 units hs correction  Inpatient Diabetes Program Recommendations:   Please consider: -Change Novolog  0-20 units q 4 hrs. While NPO -Increase Semglee  to 30 units daily  Thank you, Alizza Sacra E. Charmon Thorson, RN, MSN, CNS, CDCES  Diabetes Coordinator Inpatient Glycemic Control Team Team Pager [phone] (8am-5pm) 01/31/2024 10:23 AM

## 2024-01-31 NOTE — Consult Note (Cosign Needed Addendum)
 [facility] CLINIC CARDIOLOGY CONSULT NOTE       Patient ID: Russell [name] MRN: [id number] DOB/AGE: [birth year] 58 y.o.  Admit date: 01/30/2024 Referring Physician Dr. Laneta Blunt Primary Physician Olmedo, [name], MD  Primary Cardiologist [name] (previously seen by [name], [name]) Reason for Consultation AF RVR  HPI: Russell [name] is a 58 y.o. male  with a past medical history of paroxysmal atrial fibrillation not on AC, chronic diastolic heart failure, hx R BKA and L AKA, L forearm amputation, hx GIB 2/2 peptic ulcer who presented to the ED on 01/30/2024 for back pain, black tarry stool. Given transfusion overnight for significant anemia. Went into AF RVR this AM. Cardiology was consulted for further evaluation.   Patient initially came to ED yesterday for lower back pain and black tarry stool. Workup in the ED notable for creatinine 1.14, potassium 4.6, hemoglobin 6.0 (from 11.3 on 01/27/24), WBC 8.0. Has received one unit without much improvement in Hgb. This morning developed AF RVR - rate 130s on tele.   At the time of my evaluation, patient is resting in bed. Somnolent on exam but does wake and answer questions but quickly falls back asleep. Denies any CP, SOB, palpitation symptoms. Reports lower abdominal pain. On supplemental O2.    Review of systems complete and found to be negative unless listed above    Past Medical History:  Diagnosis Date   Anxiety    CHF (congestive heart failure) (HCC)    EF 30-35%   Chronic diastolic (congestive) heart failure (HCC)    Depression    Diabetes mellitus without complication (HCC)    Hyperlipemia    Hypertension    Morbid obesity (HCC)    Peripheral edema     Past Surgical History:  Procedure Laterality Date   AMPUTATION Left 09/07/2023   Procedure: AMPUTATION, ABOVE KNEE;  Surgeon: Marea Selinda RAMAN, MD;  Location: [facility] ORS;  Service: General;  Laterality: Left;   APPENDECTOMY     BELOW KNEE LEG AMPUTATION     Right leg    BIOPSY  10/14/2022   Procedure: BIOPSY;  Surgeon: Aundria, Ladell POUR, MD;  Location: [facility] ENDOSCOPY;  Service: Gastroenterology;;   COLONOSCOPY N/A 10/16/2022   Procedure: COLONOSCOPY;  Surgeon: Therisa Bi, MD;  Location: [facility] ENDOSCOPY;  Service: Gastroenterology;  Laterality: N/A;   ESOPHAGOGASTRODUODENOSCOPY (EGD) WITH PROPOFOL  N/A 10/26/2022   Procedure: ESOPHAGOGASTRODUODENOSCOPY (EGD) WITH PROPOFOL ;  Surgeon: Maryruth Ole DASEN, MD;  Location: [facility] ENDOSCOPY;  Service: Endoscopy;  Laterality: N/A;   ESOPHAGOGASTRODUODENOSCOPY (EGD) WITH PROPOFOL  N/A 10/14/2022   Procedure: ESOPHAGOGASTRODUODENOSCOPY (EGD) WITH PROPOFOL ;  Surgeon: Toledo, Ladell POUR, MD;  Location: [facility] ENDOSCOPY;  Service: Gastroenterology;  Laterality: N/A;   INCISION AND DRAINAGE ABSCESS Left 02/27/2023   Procedure: INCISION AND DRAINAGE ABSCESS LEFT FINGER AND ARM;  Surgeon: Robbin Redell Ned, MD;  Location: [facility] ORS;  Service: Orthopedics;  Laterality: Left;   TONSILLECTOMY      (Not in a hospital admission)  Social History   Socioeconomic History   Marital status: Legally Separated    Spouse name: Not on file   Number of children: Not on file   Years of education: Not on file   Highest education level: Not on file  Occupational History   Not on file  Tobacco Use   Smoking status: Never   Smokeless tobacco: Never  Vaping Use   Vaping status: Never Used  Substance and Sexual Activity   Alcohol use: No   Drug use: No  Sexual activity: Not Currently  Other Topics Concern   Not on file  Social History Narrative   Living with mother now   Wheelchair bound at baseline   Social Drivers of Health   Financial Resource Strain: Low Risk  (04/04/2023)   Received from [facility]   Overall Financial Resource Strain (CARDIA)    Difficulty of Paying Living Expenses: Not hard at all  Food Insecurity: Unknown (09/07/2023)   Hunger Vital Sign    Worried About Running Out of Food in the Last Year:  Not on file    Ran Out of Food in the Last Year: Patient declined  Transportation Needs: No Transportation Needs (09/07/2023)   PRAPARE - Administrator, Civil Service (Medical): No    Lack of Transportation (Non-Medical): No  Physical Activity: Not on file  Stress: Not on file  Social Connections: Not on file  Intimate Partner Violence: Not At Risk (09/07/2023)   Humiliation, Afraid, Rape, and Kick questionnaire    Fear of Current or Ex-Partner: No    Emotionally Abused: No    Physically Abused: No    Sexually Abused: No    Family History  Problem Relation Age of Onset   COPD Mother    Diabetes Father    CAD Father    Kidney cancer Brother      Vitals:   01/31/24 0545 01/31/24 0603 01/31/24 0800 01/31/24 0928  BP: 110/64 118/60 126/68 (!) 115/57  Pulse: 85 84 83 86  Resp: 13 14 11 11   Temp: 98.1 F (36.7 C) 98 F (36.7 C) 98.2 F (36.8 C) 98 F (36.7 C)  TempSrc: Oral Oral Oral Oral  SpO2: 100% 100% 100% 100%  Weight:        PHYSICAL EXAM General: Chronically ill appearing male, well nourished, in no acute distress. HEENT: Normocephalic and atraumatic. Neck: No JVD.  Lungs: Normal respiratory effort on 2L [place]. Clear bilaterally to auscultation. No wheezes, crackles, rhonchi.  Heart: Irregularly irregular, elevated rate. Normal S1 and S2 without gallops or murmurs.  Abdomen: Non-distended appearing.  Msk: Normal strength and tone for age. Extremities: S/p L AKA and R BKA. Neuro: Alert and oriented X 3. Somnolent. Psych: Answers questions appropriately.   Labs: Basic Metabolic Panel: Recent Labs    01/30/24 2220 01/31/24 0317  NA 132* 131*  K 4.7 4.7  CL 101 102  CO2 21* 20*  GLUCOSE 446* 379*  BUN 59* 63*  CREATININE 1.02 1.14  CALCIUM  8.1* 7.9*   Liver Function Tests: No results for input(s): AST, ALT, ALKPHOS, BILITOT, PROT, ALBUMIN  in the last 72 hours. No results for input(s): LIPASE, AMYLASE in the last 72  hours. CBC: Recent Labs    01/30/24 0808 01/30/24 1512 01/30/24 2220 01/31/24 0450  WBC 8.0  --  9.3 11.9*  NEUTROABS 5.1  --   --   --   HGB 6.0*   < > 5.9* 6.3*  HCT 19.2*   < > 18.7* 19.5*  MCV 67.8*  --  71.9* 73.3*  PLT 209  --  223 216   < > = values in this interval not displayed.   Cardiac Enzymes: No results for input(s): CKTOTAL, CKMB, CKMBINDEX, TROPONINIHS in the last 72 hours. BNP: No results for input(s): BNP in the last 72 hours. D-Dimer: No results for input(s): DDIMER in the last 72 hours. Hemoglobin A1C: No results for input(s): HGBA1C in the last 72 hours. Fasting Lipid Panel: No results for input(s):  CHOL, HDL, LDLCALC, TRIG, CHOLHDL, LDLDIRECT in the last 72 hours. Thyroid  Function Tests: No results for input(s): TSH, T4TOTAL, T3FREE, THYROIDAB in the last 72 hours.  Invalid input(s): FREET3 Anemia Panel: Recent Labs    01/30/24 0808  FERRITIN 26  TIBC 280  IRON  26*  RETICCTPCT 2.3     Radiology: No results found.  ECHO ordered  TELEMETRY (personally reviewed): atrial fibrillation rate 130s  EKG (personally reviewed): none for review this admission  Data reviewed by me 01/31/2024: last 24h vitals tele labs imaging I/O ED provider note, admission H&P  Principal Problem:   GI bleed Active Problems:   Acute blood loss anemia   Melena    ASSESSMENT AND PLAN:  JEPTHA HINNENKAMP is a 58 y.o. male  with a past medical history of paroxysmal atrial fibrillation not on AC, chronic diastolic heart failure, hx R BKA and L AKA, L forearm amputation, hx GIB 2/2 peptic ulcer who presented to the ED on 01/30/2024 for back pain, black tarry stool. Given transfusion overnight for significant anemia. Went into AF RVR this AM. Cardiology was consulted for further evaluation.   # Atrial fibrillation RVR # Paroxysmal atrial fibrillation # Suspected acute on chronic HFpEF # GI bleed, ABLA Patient presented with back pain  and episode of black tarry stool, found to have Hgb down to 6.0 from 11.3 three days prior. Transfused overnight with not much response. 01/31/2024 converted to AF RVR.  -Echo ordered, further recommendations pending these results. -Start IV amiodarone  bolus and infusion.  -Ultimately patient would benefit from anticoagulation. -CXR with apparent vascular congestion so will give dose of IV lasix . BNP ordered. -Continue aspirin  81 mg daily, atorvastatin  80 mg daily.  -GI following, appreciate recs.  -Further management per primary team, GI.   This patient's plan of care was discussed and created with Dr. Florencio and he is in agreement.  Signed: Danita Bloch, PA-C  01/31/2024, 10:09 AM [facility]

## 2024-01-31 NOTE — Plan of Care (Signed)
 Problem: Education: Goal: Ability to describe self-care measures that may prevent or decrease complications (Diabetes Survival Skills Education) will improve 01/31/2024 1948 by Cena Almarie NOVAK, RN Outcome: Progressing 01/31/2024 1948 by Cena Almarie NOVAK, RN Outcome: Progressing Goal: Individualized Educational Video(s) 01/31/2024 1948 by Cena Almarie NOVAK, RN Outcome: Progressing 01/31/2024 1948 by Cena Almarie NOVAK, RN Outcome: Progressing   Problem: Coping: Goal: Ability to adjust to condition or change in health will improve 01/31/2024 1948 by Cena Almarie NOVAK, RN Outcome: Progressing 01/31/2024 1948 by Cena Almarie NOVAK, RN Outcome: Progressing   Problem: Fluid Volume: Goal: Ability to maintain a balanced intake and output will improve 01/31/2024 1948 by Cena Almarie NOVAK, RN Outcome: Progressing 01/31/2024 1948 by Cena Almarie NOVAK, RN Outcome: Progressing   Problem: Health Behavior/Discharge Planning: Goal: Ability to identify and utilize available resources and services will improve 01/31/2024 1948 by Cena Almarie NOVAK, RN Outcome: Progressing 01/31/2024 1948 by Cena Almarie NOVAK, RN Outcome: Progressing Goal: Ability to manage health-related needs will improve 01/31/2024 1948 by Cena Almarie NOVAK, RN Outcome: Progressing 01/31/2024 1948 by Cena Almarie NOVAK, RN Outcome: Progressing   Problem: Metabolic: Goal: Ability to maintain appropriate glucose levels will improve 01/31/2024 1948 by Cena Almarie NOVAK, RN Outcome: Progressing 01/31/2024 1948 by Cena Almarie NOVAK, RN Outcome: Progressing   Problem: Nutritional: Goal: Maintenance of adequate nutrition will improve 01/31/2024 1948 by Cena Almarie NOVAK, RN Outcome: Progressing 01/31/2024 1948 by Cena Almarie NOVAK, RN Outcome: Progressing Goal: Progress toward achieving an optimal weight will improve 01/31/2024 1948 by Cena Almarie NOVAK, RN Outcome:  Progressing 01/31/2024 1948 by Cena Almarie NOVAK, RN Outcome: Progressing   Problem: Skin Integrity: Goal: Risk for impaired skin integrity will decrease 01/31/2024 1948 by Cena Almarie NOVAK, RN Outcome: Progressing 01/31/2024 1948 by Cena Almarie NOVAK, RN Outcome: Progressing   Problem: Tissue Perfusion: Goal: Adequacy of tissue perfusion will improve 01/31/2024 1948 by Cena Almarie NOVAK, RN Outcome: Progressing 01/31/2024 1948 by Cena Almarie NOVAK, RN Outcome: Progressing   Problem: Education: Goal: Knowledge of General Education information will improve Description: Including pain rating scale, medication(s)/side effects and non-pharmacologic comfort measures 01/31/2024 1948 by Cena Almarie NOVAK, RN Outcome: Progressing 01/31/2024 1948 by Cena Almarie NOVAK, RN Outcome: Progressing   Problem: Health Behavior/Discharge Planning: Goal: Ability to manage health-related needs will improve 01/31/2024 1948 by Cena Almarie NOVAK, RN Outcome: Progressing 01/31/2024 1948 by Cena Almarie NOVAK, RN Outcome: Progressing   Problem: Clinical Measurements: Goal: Ability to maintain clinical measurements within normal limits will improve 01/31/2024 1948 by Cena Almarie NOVAK, RN Outcome: Progressing 01/31/2024 1948 by Cena Almarie NOVAK, RN Outcome: Progressing Goal: Will remain free from infection 01/31/2024 1948 by Cena Almarie NOVAK, RN Outcome: Progressing 01/31/2024 1948 by Cena Almarie NOVAK, RN Outcome: Progressing Goal: Diagnostic test results will improve 01/31/2024 1948 by Cena Almarie NOVAK, RN Outcome: Progressing 01/31/2024 1948 by Cena Almarie NOVAK, RN Outcome: Progressing Goal: Respiratory complications will improve 01/31/2024 1948 by Cena Almarie NOVAK, RN Outcome: Progressing 01/31/2024 1948 by Cena Almarie NOVAK, RN Outcome: Progressing Goal: Cardiovascular complication will be avoided 01/31/2024 1948 by Cena Almarie NOVAK, RN Outcome: Progressing 01/31/2024 1948 by Cena Almarie NOVAK, RN Outcome: Progressing   Problem: Activity: Goal: Risk for activity intolerance will decrease 01/31/2024 1948 by Cena Almarie NOVAK, RN Outcome: Progressing 01/31/2024 1948 by Cena Almarie NOVAK, RN Outcome: Progressing   Problem: Nutrition: Goal: Adequate nutrition will be maintained 01/31/2024 1948 by Cena Almarie NOVAK, RN Outcome: Progressing 01/31/2024 1948 by Cena Almarie NOVAK, RN Outcome: Progressing  Problem: Coping: Goal: Level of anxiety will decrease 01/31/2024 1948 by Cena Almarie NOVAK, RN Outcome: Progressing 01/31/2024 1948 by Cena Almarie NOVAK, RN Outcome: Progressing   Problem: Elimination: Goal: Will not experience complications related to bowel motility 01/31/2024 1948 by Cena Almarie NOVAK, RN Outcome: Progressing 01/31/2024 1948 by Cena Almarie NOVAK, RN Outcome: Progressing Goal: Will not experience complications related to urinary retention 01/31/2024 1948 by Cena Almarie NOVAK, RN Outcome: Progressing 01/31/2024 1948 by Cena Almarie NOVAK, RN Outcome: Progressing   Problem: Pain Managment: Goal: General experience of comfort will improve and/or be controlled 01/31/2024 1948 by Cena Almarie NOVAK, RN Outcome: Progressing 01/31/2024 1948 by Cena Almarie NOVAK, RN Outcome: Progressing   Problem: Safety: Goal: Ability to remain free from injury will improve 01/31/2024 1948 by Cena Almarie NOVAK, RN Outcome: Progressing 01/31/2024 1948 by Cena Almarie NOVAK, RN Outcome: Progressing   Problem: Skin Integrity: Goal: Risk for impaired skin integrity will decrease 01/31/2024 1948 by Cena Almarie NOVAK, RN Outcome: Progressing 01/31/2024 1948 by Cena Almarie NOVAK, RN Outcome: Progressing

## 2024-01-31 NOTE — Progress Notes (Signed)
 [name], MD [facility]   [address] [place], [place] [address] Phone: [phone] Fax : [phone]   Subjective: The patient was seen today and appeared quite upset with his situation.  He was talking to the nurse about not wanting to be intubated and he does not want to suffer anymore.  The patients hemoglobin continues to be low despite transfusions.  The patient's most recent hemoglobin was 7.1 after the patient has received numerous blood transfusions.  It does not appear that he has had any further black stools.   Objective: Vital signs in last 24 hours: Vitals:   01/31/24 0928 01/31/24 1130 01/31/24 1210 01/31/24 1400  BP: (!) 115/57 95/60 (!) 81/54 (!) 128/93  Pulse: 86 (!) 101  (!) 134  Resp: 11 11  17   Temp: 98 F (36.7 C)   98.2 F (36.8 C)  TempSrc: Oral   Axillary  SpO2: 100% 97%  100%  Weight:       Weight change:   Intake/Output Summary (Last 24 hours) at 01/31/2024 1727 Last data filed at 01/30/2024 1831 Gross per 24 hour  Intake --  Output 800 ml  Net -800 ml     Exam: General: The patient is laying in bed in emotional distress but no physical distress.  He is hypotensive   Lab Results: @LABTEST2 @ Micro Results: Recent Results (from the past 240 hours)  Blood culture (routine x 2)     Status: None (Preliminary result)   Collection Time: 01/27/24  5:34 PM   Specimen: BLOOD  Result Value Ref Range Status   Specimen Description BLOOD LEFT ANTECUBITAL  Final   Special Requests   Final    BOTTLES DRAWN AEROBIC AND ANAEROBIC Blood Culture adequate volume   Culture   Final    NO GROWTH 4 DAYS Performed at [facility], [address], [place], [place] [address]    Report Status PENDING  Incomplete  Blood culture (routine x 2)     Status: None (Preliminary result)   Collection Time: 01/27/24  5:39 PM   Specimen: BLOOD  Result Value Ref Range Status   Specimen Description BLOOD BLOOD RIGHT HAND  Final   Special Requests    Final    BOTTLES DRAWN AEROBIC AND ANAEROBIC Blood Culture results may not be optimal due to an inadequate volume of blood received in culture bottles   Culture   Final    NO GROWTH 4 DAYS Performed at [facility], [address] [place], [place] [address]    Report Status PENDING  Incomplete   Studies/Results: DG Chest Port 1 View Result Date: 01/31/2024 EXAM: 1 VIEW(S) XRAY OF THE CHEST 01/31/2024 03:05:27 PM COMPARISON: None available. CLINICAL HISTORY: Encounter for central line placement. FINDINGS: LINES, TUBES AND DEVICES: Interval placement of right internal jugular catheter with distal tip in expected position of SVC. LUNGS AND PLEURA: No focal pulmonary opacity. No pleural effusion. No pneumothorax. HEART AND MEDIASTINUM: The distal tip of the right internal jugular catheter is in the expected position of the SVC. No acute abnormality of the cardiac and mediastinal silhouettes. BONES AND SOFT TISSUES: No acute osseous abnormality. IMPRESSION: 1. Right internal jugular catheter with the distal tip in the expected position within the superior vena cava. Electronically signed by: Lynwood Seip MD 01/31/2024 03:26 PM EST RP Workstation: [id number]   DG Chest Port 1 View Result Date: 01/31/2024 EXAM: 1 VIEW(S) XRAY OF THE CHEST 01/31/2024 10:43:58 AM COMPARISON: 09/06/2023 CLINICAL HISTORY: SOB (  shortness of breath); Atrial fibrillation with RVR (HCC) FINDINGS: LUNGS AND PLEURA: Low lung volumes. No focal pulmonary opacity. No pleural effusion. No pneumothorax. HEART AND MEDIASTINUM: No acute abnormality of the cardiac and mediastinal silhouettes. BONES AND SOFT TISSUES: No acute osseous abnormality. IMPRESSION: 1. Low lung volumes, No acute cardiopulmonary process. Electronically signed by: Dayne Hassell MD 01/31/2024 10:58 AM EST RP Workstation: [id number]   Medications: I have reviewed the patient's current medications. Scheduled Meds:  sodium chloride    Intravenous Once   sodium  chloride   Intravenous Once   atorvastatin   40 mg Oral Daily   baclofen   15 mg Oral Q8H   Chlorhexidine  Gluconate Cloth  6 each Topical Daily   finasteride   5 mg Oral Daily   insulin  aspart  0-20 Units Subcutaneous Q4H   insulin  glargine-yfgn  20 Units Subcutaneous QHS   melatonin  5 mg Oral QHS   pantoprazole  (PROTONIX ) IV  40 mg Intravenous Q12H   pregabalin   150 mg Oral TID   tamsulosin   0.8 mg Oral Daily   Continuous Infusions:  sodium chloride      amiodarone      norepinephrine  (LEVOPHED ) Adult infusion 12 mcg/min (01/31/24 1515)   PRN Meds:.bisacodyl , HYDROmorphone  (DILAUDID ) injection, iohexol , loperamide , metoprolol  tartrate, ondansetron  **OR** ondansetron  (ZOFRAN ) IV, oxyCODONE , senna-docusate   Assessment: Principal Problem:   GI bleed Active Problems:   Acute blood loss anemia   Melena    Plan: The patient is in the ICU with a GI bleed and had recently been made a DNR/DNI.  The patient has a CT angiography ordered and has had vascular surgery involved in case the source of the patient's bleeding is found.  The patient is not stable from a GI point of view at this point for any endoscopic intervention.   LOS: 1 day   [name], MD.FACG 01/31/2024, 5:27 PM Pager [phone] 7am-5pm  Check AMION for 5pm -7am coverage and on weekends

## 2024-02-01 ENCOUNTER — Encounter
Admission: EM | Disposition: A | Discharge status: Skilled Nursing Facility | Payer: Self-pay | Source: Home / Self Care | Attending: Hospitalist

## 2024-02-01 ENCOUNTER — Inpatient Hospital Stay: Admitting: Anesthesiology

## 2024-02-01 ENCOUNTER — Inpatient Hospital Stay: Admit: 2024-02-01 | Discharge: 2024-02-01 | Disposition: A | Attending: Osteopathic Medicine

## 2024-02-01 DIAGNOSIS — K921 Melena: Secondary | ICD-10-CM

## 2024-02-01 DIAGNOSIS — K269 Duodenal ulcer, unspecified as acute or chronic, without hemorrhage or perforation: Secondary | ICD-10-CM

## 2024-02-01 HISTORY — PX: ESOPHAGOGASTRODUODENOSCOPY: SHX5428

## 2024-02-01 HISTORY — PX: HEMOSTASIS CLIP PLACEMENT: SHX6857

## 2024-02-01 LAB — ECHOCARDIOGRAM COMPLETE
AR max vel: 2.75 cm2
AV Area VTI: 2.89 cm2
AV Area mean vel: 2.74 cm2
AV Mean grad: 10 mmHg
AV Peak grad: 19.4 mmHg
Ao pk vel: 2.2 m/s
Area-P 1/2: 3.19 cm2
MV VTI: 2.91 cm2
S' Lateral: 3.4 cm
Weight: 4368 [oz_av]

## 2024-02-01 LAB — CBC
HCT: 20.9 % — ABNORMAL LOW (ref 39.0–52.0)
Hemoglobin: 6.8 g/dL — ABNORMAL LOW (ref 13.0–17.0)
MCH: 25.3 pg — ABNORMAL LOW (ref 26.0–34.0)
MCHC: 32.5 g/dL (ref 30.0–36.0)
MCV: 77.7 fL — ABNORMAL LOW (ref 80.0–100.0)
Platelets: 219 K/uL (ref 150–400)
RBC: 2.69 MIL/uL — ABNORMAL LOW (ref 4.22–5.81)
RDW: 22.5 % — ABNORMAL HIGH (ref 11.5–15.5)
WBC: 11.6 K/uL — ABNORMAL HIGH (ref 4.0–10.5)
nRBC: 0.4 % — ABNORMAL HIGH (ref 0.0–0.2)

## 2024-02-01 LAB — CULTURE, BLOOD (ROUTINE X 2)
Culture: NO GROWTH
Culture: NO GROWTH
Special Requests: ADEQUATE

## 2024-02-01 LAB — PHOSPHORUS: Phosphorus: 3.9 mg/dL (ref 2.5–4.6)

## 2024-02-01 LAB — BASIC METABOLIC PANEL WITH GFR
Anion gap: 10 (ref 5–15)
BUN: 49 mg/dL — ABNORMAL HIGH (ref 6–20)
CO2: 23 mmol/L (ref 22–32)
Calcium: 8.1 mg/dL — ABNORMAL LOW (ref 8.9–10.3)
Chloride: 103 mmol/L (ref 98–111)
Creatinine, Ser: 1.06 mg/dL (ref 0.61–1.24)
GFR, Estimated: >60 mL/min (ref >60)
Glucose, Bld: 359 mg/dL — ABNORMAL HIGH (ref 70–99)
Potassium: 3.4 mmol/L — ABNORMAL LOW (ref 3.5–5.1)
Sodium: 136 mmol/L (ref 135–145)

## 2024-02-01 LAB — LACTIC ACID, PLASMA: Lactic Acid, Venous: 1.5 mmol/L (ref 0.5–1.9)

## 2024-02-01 LAB — HEMOGLOBIN AND HEMATOCRIT, BLOOD
HCT: 21.1 % — ABNORMAL LOW (ref 39.0–52.0)
HCT: 22.9 % — ABNORMAL LOW (ref 39.0–52.0)
HCT: 21.8 % — ABNORMAL LOW (ref 39.0–52.0)
Hemoglobin: 6.8 g/dL — ABNORMAL LOW (ref 13.0–17.0)
Hemoglobin: 7.4 g/dL — ABNORMAL LOW (ref 13.0–17.0)
Hemoglobin: 6.9 g/dL — ABNORMAL LOW (ref 13.0–17.0)

## 2024-02-01 LAB — GLUCOSE, CAPILLARY
Glucose-Capillary: 132 mg/dL — ABNORMAL HIGH (ref 70–99)
Glucose-Capillary: 143 mg/dL — ABNORMAL HIGH (ref 70–99)
Glucose-Capillary: 159 mg/dL — ABNORMAL HIGH (ref 70–99)
Glucose-Capillary: 211 mg/dL — ABNORMAL HIGH (ref 70–99)
Glucose-Capillary: 275 mg/dL — ABNORMAL HIGH (ref 70–99)
Glucose-Capillary: 325 mg/dL — ABNORMAL HIGH (ref 70–99)
Glucose-Capillary: 340 mg/dL — ABNORMAL HIGH (ref 70–99)

## 2024-02-01 LAB — MAGNESIUM: Magnesium: 2.1 mg/dL (ref 1.7–2.4)

## 2024-02-01 LAB — PREPARE RBC (CROSSMATCH)

## 2024-02-01 SURGERY — EGD (ESOPHAGOGASTRODUODENOSCOPY)
Anesthesia: General

## 2024-02-01 MED ORDER — KETAMINE HCL 50 MG/5ML IJ SOSY
PREFILLED_SYRINGE | INTRAMUSCULAR | Status: AC
  Filled 2024-02-01: qty 5

## 2024-02-01 MED ORDER — SODIUM CHLORIDE 0.9% IV SOLUTION: Freq: Once | INTRAVENOUS | Status: AC

## 2024-02-01 MED ORDER — LIDOCAINE HCL (PF) 2 % IJ SOLN
INTRAMUSCULAR | Status: AC
  Filled 2024-02-01: qty 10

## 2024-02-01 MED ORDER — PROPOFOL 1000 MG/100ML IV EMUL
INTRAVENOUS | Status: AC
  Filled 2024-02-01: qty 100

## 2024-02-01 MED ORDER — KETAMINE HCL 10 MG/ML IJ SOLN
INTRAMUSCULAR | Status: DC | PRN
  Administered 2024-02-01 (×2): 20 mg via INTRAVENOUS
  Administered 2024-02-01: 10 mg via INTRAVENOUS

## 2024-02-01 MED ORDER — INSULIN GLARGINE-YFGN 100 UNIT/ML ~~LOC~~ SOLN
30.0000 [IU] | Freq: Every day | SUBCUTANEOUS | Status: DC
  Administered 2024-02-01 – 2024-02-08 (×8): 30 [IU] via SUBCUTANEOUS
  Filled 2024-02-01 (×9): qty 0.3

## 2024-02-01 MED ORDER — POTASSIUM CHLORIDE 10 MEQ/100ML IV SOLN
10.0000 meq | INTRAVENOUS | Status: AC
  Administered 2024-02-01: 10 meq via INTRAVENOUS
  Filled 2024-02-01 (×2): qty 100

## 2024-02-01 MED ORDER — FENTANYL CITRATE (PF) 100 MCG/2ML IJ SOLN
INTRAMUSCULAR | Status: AC
  Filled 2024-02-01: qty 2

## 2024-02-01 MED ORDER — GLYCOPYRROLATE 0.2 MG/ML IJ SOLN
INTRAMUSCULAR | Status: DC | PRN
  Administered 2024-02-01 (×2): .2 mg via INTRAVENOUS

## 2024-02-01 MED ORDER — SODIUM CHLORIDE 0.9 % IV SOLN: INTRAVENOUS | Status: DC

## 2024-02-01 MED ORDER — GLYCOPYRROLATE 0.2 MG/ML IJ SOLN
INTRAMUSCULAR | Status: AC
  Filled 2024-02-01: qty 1

## 2024-02-01 MED ORDER — DEXMEDETOMIDINE HCL IN NACL 80 MCG/20ML IV SOLN
INTRAVENOUS | Status: AC
  Filled 2024-02-01: qty 20

## 2024-02-01 NOTE — Progress Notes (Signed)
 Progress Note    02/01/2024 3:22 PM * Day of Surgery *  Subjective:  Russell [name] a 58 yo male who presented to [facility] emergency department with 3 days of black tarry stools.  He also complains of tailbone pain which she has been taking BC Goody's powder daily.  On exam this morning patient endorses tailbone pain.  States it is tender to him.  He denies having any more dark tarry stools this morning.  No other complaints overnight.  Vitals are remained stable.   Vitals:   02/01/24 1446 02/01/24 1519  BP: 123/71 (!) 182/81  Pulse: 74 77  Resp: 18 20  Temp: (!) 96.5 F (35.8 C) 98.2 F (36.8 C)  SpO2: 100% 100%   Physical Exam: Cardiac:  RRR, normal S1 and S2.  No murmurs appreciated. Lungs: Nonlabored breathing, clear on auscultation throughout except in the bases bilaterally.  No rales rhonchi or wheezing appreciated. Incisions: None Extremities: Patient with a history of right lower extremity BKA, left lower extremity AKA, and left-sided forearm amputation.  All stumps and extremities are warm to touch. Abdomen: Positive bowel sounds throughout, soft, slight distention. Neurologic: Alert and oriented x 3, answers all questions and follows commands.  CBC    Component Value Date/Time   WBC 11.6 (H) 02/01/2024 0410   RBC 2.69 (L) 02/01/2024 0410   HGB 7.4 (L) 02/01/2024 1348   HGB 12.6 (L) 06/13/2014 0421   HCT 22.9 (L) 02/01/2024 1348   HCT 39.0 (L) 06/13/2014 0421   PLT 219 02/01/2024 0410   PLT 190 06/13/2014 0421   MCV 77.7 (L) 02/01/2024 0410   MCV 80 06/13/2014 0421   MCH 25.3 (L) 02/01/2024 0410   MCHC 32.5 02/01/2024 0410   RDW 22.5 (H) 02/01/2024 0410   RDW 15.1 (H) 06/13/2014 0421   LYMPHSABS 2.2 01/30/2024 0808   LYMPHSABS 2.1 06/13/2014 0421   MONOABS 0.4 01/30/2024 0808   MONOABS 0.8 06/13/2014 0421   EOSABS 0.1 01/30/2024 0808   EOSABS 0.4 06/13/2014 0421   BASOSABS 0.0 01/30/2024 0808   BASOSABS 0.1 06/13/2014 0421    BMET    Component Value  Date/Time   NA 136 02/01/2024 0410   NA 136 06/13/2014 0421   K 3.4 (L) 02/01/2024 0410   K 4.0 06/13/2014 0421   CL 103 02/01/2024 0410   CL 100 (L) 06/13/2014 0421   CO2 23 02/01/2024 0410   CO2 29 06/13/2014 0421   GLUCOSE 359 (H) 02/01/2024 0410   GLUCOSE 143 (H) 06/13/2014 0421   BUN 49 (H) 02/01/2024 0410   BUN 22 (H) 06/13/2014 0421   CREATININE 1.06 02/01/2024 0410   CREATININE 0.87 06/13/2014 0421   CALCIUM  8.1 (L) 02/01/2024 0410   CALCIUM  8.6 (L) 06/13/2014 0421   GFRNONAA >60 02/01/2024 0410   GFRNONAA >60 06/13/2014 0421   GFRAA >60 06/03/2018 0014   GFRAA >60 06/13/2014 0421    INR    Component Value Date/Time   INR 1.1 01/30/2024 0942   INR 0.9 06/05/2014 2241     Intake/Output Summary (Last 24 hours) at 02/01/2024 1522 Last data filed at 02/01/2024 1330 Gross per 24 hour  Intake 1529.83 ml  Output 7275 ml  Net -5745.17 ml     Assessment/Plan:  58 y.o. male is s/p SEE ABOVE  * Day of Surgery *   PLAN Patient underwent CTA of the abdomen last night which did not show any active bleeding in the stomach or small bowel.  Therefore there is  no active source of bleeding and follow-up vascular surgery does not recommend any intervention or embolization at this time. Patient did undergo upper endoscopy later today via GI services.  They may be able to identify a prior bleeding issue.  If they are to establish an area that was bleeding and the patient starts to bleed in the future we would be glad to provide embolization to that area. Vascular surgery will sign off at this time as the patient is not actively bleeding.   DVT prophylaxis: Held due to GI bleeding.   [name] [facility] 02/01/2024 3:22 PM

## 2024-02-01 NOTE — Discharge Instructions (Signed)
 Some PCP options in [place] area- not a comprehensive list  [facility]- [phone] [facility]- [phone] [facility]- [phone] [facility]- [phone] [facility]- [phone] Nichole Molly- [phone]  or [facility] Physician Referral Line [phone]

## 2024-02-01 NOTE — Op Note (Signed)
 [facility] Gastroenterology Patient Name: Russell [name] Procedure Date: 02/01/2024 1:31 PM MRN: [id number] Account #: [id number] Date of Birth: [birth year] Admit Type: Outpatient Age: 58 Room: [facility] ENDO ROOM 4 Gender: Male Note Status: Finalized Instrument Name: Barnie GI Scope [id number] Procedure:             Upper GI endoscopy Indications:           Melena Providers:             [name] MD, MD Referring MD:          Eliverto [name] Medicines:             Monitored Anesthesia Care Complications:         No immediate complications. Procedure:             Pre-Anesthesia Assessment:                        - Prior to the procedure, a History and Physical was                         performed, and patient medications and allergies were                         reviewed. The patient's tolerance of previous                         anesthesia was also reviewed. The risks and benefits                         of the procedure and the sedation options and risks                         were discussed with the patient. All questions were                         answered, and informed consent was obtained. Prior                         Anticoagulants: The patient has taken no anticoagulant                         or antiplatelet agents. ASA Grade Assessment: II - A                         patient with mild systemic disease. After reviewing                         the risks and benefits, the patient was deemed in                         satisfactory condition to undergo the procedure.                        - Prior to the procedure, a History and Physical was                         performed, and patient medications and allergies were  reviewed. The patient's tolerance of previous                         anesthesia was also reviewed. The risks and benefits                         of the procedure and the sedation options and risks                          were discussed with the patient. All questions were                         answered, and informed consent was obtained. Prior                         Anticoagulants: The patient has taken no anticoagulant                         or antiplatelet agents. ASA Grade Assessment: IV - A                         patient with severe systemic disease that is a                         constant threat to life. After reviewing the risks and                         benefits, the patient was deemed in satisfactory                         condition to undergo the procedure.                        After obtaining informed consent, the endoscope was                         passed under direct vision. Throughout the procedure,                         the patient's blood pressure, pulse, and oxygen                         saturations were monitored continuously. The Endoscope                         was introduced through the mouth, and advanced to the                         second part of duodenum. The upper GI endoscopy was                         accomplished without difficulty. The patient tolerated                         the procedure well. Findings:      A small hiatal hernia was present.      The entire examined stomach was normal.      Many non-bleeding cratered  duodenal ulcers with a flat pigmented spot       (Forrest Class IIc) were found in the second portion of the duodenum.       For hemostasis, two hemostatic clips were successfully placed (MR       conditional). Clip manufacturer: [employer]. There was no       bleeding at the end of the procedure. Impression:            - Small hiatal hernia.                        - Normal stomach.                        - Non-bleeding duodenal ulcers with a flat pigmented                         spot (Forrest Class IIc). Clips (MR conditional) were                         placed. Clip manufacturer: [employer].                         - No specimens collected. Recommendation:        - Return patient to ICU.                        - Clear liquid diet.                        - Continue present medications.                        - If any further bleeding then vascular surgery for                         embolization at the site of the clips placed. Procedure Code(s):     --- Professional ---                        [phone], Esophagogastroduodenoscopy, flexible,                         transoral; with control of bleeding, any method Diagnosis Code(s):     --- Professional ---                        K92.1, Melena (includes Hematochezia)                        K26.9, Duodenal ulcer, unspecified as acute or                         chronic, without hemorrhage or perforation CPT copyright 2022 [employer]. All rights reserved. The codes documented in this report are preliminary and upon coder review may  be revised to meet current compliance requirements. [name] MD, MD 02/01/2024 3:16:10 PM This report has been signed electronically. Number of Addenda: 0 Note Initiated On: 02/01/2024 1:31 PM Estimated Blood Loss:  Estimated blood loss: none.      [facility]

## 2024-02-01 NOTE — Transfer of Care (Signed)
 Immediate Anesthesia Transfer of Care Note  Patient: Russell [name]  Procedure(s) Performed: EGD (ESOPHAGOGASTRODUODENOSCOPY) CONTROL OF HEMORRHAGE, GI TRACT, ENDOSCOPIC, BY CLIPPING OR OVERSEWING  Patient Location: Endoscopy Unit  Anesthesia Type:General  Level of Consciousness: drowsy and patient cooperative  Airway & Oxygen Therapy: Patient Spontanous Breathing and Patient connected to face mask oxygen  Post-op Assessment: Report given to RN and Post -op Vital signs reviewed and stable  Post vital signs: Reviewed and stable  Last Vitals:  Vitals Value Taken Time  BP 150/80 02/01/24 15:36  Temp 36.8 C 02/01/24 15:19  Pulse 76 02/01/24 15:36  Resp 16 02/01/24 15:36  SpO2 95 % 02/01/24 15:36    Last Pain:  Vitals:   02/01/24 1519  TempSrc: Temporal  PainSc:          Complications: No notable events documented.

## 2024-02-01 NOTE — Plan of Care (Signed)
  Problem: Coping: Goal: Ability to adjust to condition or change in health will improve Outcome: Progressing   Problem: Fluid Volume: Goal: Ability to maintain a balanced intake and output will improve Outcome: Progressing   Problem: Nutritional: Goal: Maintenance of adequate nutrition will improve Outcome: Progressing   Problem: Skin Integrity: Goal: Risk for impaired skin integrity will decrease Outcome: Progressing   Problem: Tissue Perfusion: Goal: Adequacy of tissue perfusion will improve Outcome: Progressing   Problem: Clinical Measurements: Goal: Cardiovascular complication will be avoided Outcome: Progressing   Problem: Coping: Goal: Level of anxiety will decrease Outcome: Progressing   Problem: Pain Managment: Goal: General experience of comfort will improve and/or be controlled Outcome: Progressing   Problem: Safety: Goal: Ability to remain free from injury will improve Outcome: Progressing   Problem: Skin Integrity: Goal: Risk for impaired skin integrity will decrease Outcome: Progressing

## 2024-02-01 NOTE — Anesthesia Preprocedure Evaluation (Addendum)
 Anesthesia Evaluation  Patient identified by MRN, date of birth, ID band Patient awake    Reviewed: Allergy & Precautions, NPO status , Patient's Chart, lab work & pertinent test results  History of Anesthesia Complications Negative for: history of anesthetic complications  Airway Mallampati: III  TM Distance: >3 FB Neck ROM: full    Dental  (+) Poor Dentition, Dental Advidsory Given,    Pulmonary shortness of breath, sleep apnea , neg COPD, neg recent URI  2L [place]  3L [place]         Cardiovascular Exercise Tolerance: Good hypertension, On Medications (-) angina + Peripheral Vascular Disease and +CHF (chronic HFrEF with recovered LVEF)  (-) Past MI and (-) Cardiac Stents + dysrhythmias (s/p cardioversion 2023) Atrial Fibrillation (-) Valvular Problems/Murmurs Rhythm:Regular Rate:Normal + Peripheral Edema 6mcg/min NE gtt  ECHO 02/01/2024:  1. Left ventricular ejection fraction, by estimation, is 55 to 60%. The  left ventricle has normal function. The left ventricle has no regional  wall motion abnormalities. Left ventricular diastolic parameters are  consistent with Grade II diastolic  dysfunction (pseudonormalization).   2. Right ventricular systolic function is normal. The right ventricular  size is normal.   3. The mitral valve is normal in structure. No evidence of mitral valve  regurgitation.   4. The aortic valve is normal in structure. Aortic valve regurgitation is  not visualized.     Neuro/Psych  PSYCHIATRIC DISORDERS Anxiety Depression     Neuromuscular disease (R sciatica)    GI/Hepatic Neg liver ROS, PUD,,,Lower GIB- Melena - resolved currently   Endo/Other  diabetes, Poorly Controlled, Type 2  Class 3 obesity  Renal/GU Renal diseaseAKI improved  negative genitourinary   Musculoskeletal   Abdominal  (+) + obese  Peds  Hematology  (+) Blood dyscrasia, anemia   Anesthesia Other Findings H/o GI bleed  secondary to peptic ulcer. Admitted to ICU s/p 1 unit prbc today, 1 FFP today, and 4 prbc prior to today. Hbg now 7.4. NE gtt from hypotension has been weaned to 6mcg/min. He is on amiodarone  gtt for afib. Complaints of tailbone pain but in no other distress.  Past Medical History: hx R BKA and L AKA, L forearm amputation No date: Anxiety No date: CHF (congestive heart failure) (HCC)     Comment:  EF 30-35% No date: Chronic diastolic (congestive) heart failure (HCC) No date: Depression No date: Diabetes mellitus without complication (HCC) No date: Hyperlipemia No date: Hypertension No date: Morbid obesity (HCC) No date: Peripheral edema  Past Surgical History: No date: APPENDECTOMY No date: BELOW KNEE LEG AMPUTATION     Comment:  Right leg No date: TONSILLECTOMY  BMI    Body Mass Index: 50.27 kg/m      Reproductive/Obstetrics negative OB ROS                              Anesthesia Physical Anesthesia Plan  ASA: 4  Anesthesia Plan: General   Post-op Pain Management:    Induction: Intravenous  PONV Risk Score and Plan: 1 and TIVA  Airway Management Planned: Simple Face Mask  Additional Equipment:   Intra-op Plan:   Post-operative Plan:   Informed Consent: I have reviewed the patients History and Physical, chart, labs and discussed the procedure including the risks, benefits and alternatives for the proposed anesthesia with the patient or authorized representative who has indicated his/her understanding and acceptance.   Patient has DNR.  Discussed DNR with  patient and Suspend DNR.     Plan Discussed with: Anesthesiologist and CRNA  Anesthesia Plan Comments:         Anesthesia Quick Evaluation

## 2024-02-01 NOTE — Progress Notes (Signed)
 PHARMACY CONSULT NOTE - ELECTROLYTES  Pharmacy Consult for Electrolyte Monitoring and Replacement   Recent Labs: Potassium (mmol/L)  Date Value  02/01/2024 3.4 (L)  06/13/2014 4.0   Magnesium  (mg/dL)  Date Value  87/96/7974 2.1  11/08/2011 1.7 (L)   Calcium  (mg/dL)  Date Value  87/96/7974 8.1 (L)   Calcium , Total (mg/dL)  Date Value  95/85/7983 8.6 (L)   Albumin  (g/dL)  Date Value  88/71/7974 3.6  06/05/2014 3.5   Phosphorus (mg/dL)  Date Value  87/96/7974 3.9   Sodium (mmol/L)  Date Value  02/01/2024 136  06/13/2014 136   Weight: 123.8 kg (273 lb) Estimated Creatinine Clearance: 100.5 mL/min (by C-G formula based on SCr of 1.06 mg/dL).  Assessment  Russell [name] is a 58 y.o. male presenting with hemorrhagic shock secondary to GI bleeding. PMH significant for paroxysmal atrial fibrillation not on AC, chronic diastolic heart failure, hx R BKA and L AKA, L forearm amputation, hx GIB 2/2 peptic ulcer . Pharmacy has been consulted to monitor and replace electrolytes.  Diet: NPO MIVF: N/A Pertinent medications: Amiodarone   Goal of Therapy: Electrolytes within normal limits  Plan:  K 3.4, give Kcl 10 mEq IV q1h x 2 Re-check electrolytes tomorrow AM  Thank you for allowing pharmacy to be a part of this patient's care.  Marolyn KATHEE Mare 02/01/2024 11:58 AM

## 2024-02-01 NOTE — Progress Notes (Signed)
 NAME:  Russell [name], MRN:  [id number], DOB:  1965/05/11, LOS: 2 ADMISSION DATE:  01/30/2024, CONSULTATION DATE:  01/31/2024 REFERRING MD:  Dr. Laneta Blunt, CHIEF COMPLAINT:  GI bleed   History of Present Illness:  Russell [name] is a 58 year old male with a medical history significant for peripheral vascular disease s/p right sided BKA, left sided AKA, left sided forearm amputation, chronic HFrEF with recovered LVEF, Type II Diabetes Mellitus, BPH, hypertension, hyperlipidemia and a GI bleed secondary to peptic ulcer, who presented to [facility] ED with concern for black tarry stool. At time of arrival he had complaints of tailbone pain, reporting he has been taking [employer] powder for 3 days. Upon arrival he was hemodynamically stable with a  hemoglobin was 6.0.   ED Course Vitals on Arrival: Temp: 98.4, BP 152/77, HR 78, RR 21, SpO2 100%   Pertinent Diagnostics/Labs: Hgb: 6.0 RBC: 2.83 HCT: 19.2 Sodium: 127 CO2: 20 Glucose: 613  Medications Administered: Dilaudid  1mg  IV >> Dilaudid  0.5mg  >> 1mg  >> 0.5mg  Protonix  80mg  LR Bolus NaCl infusion Novolog  + Semglee  Baclofen  Lyrica  150mg  Melatonin 5mg  Finasteride  Flomas Lasix  80mg  Aspirin  81mg  Amiodarone  bolus 150mg  Amiodarone  gtt 4 units pRBCs  Pertinent Medical History  Paroxysmal Atrial Fibrillation Chronic Diastolic Heart Failure Right BKA, Left AKA Left forearm amputation Hx of GIB s/t peptic ulcer  Significant Hospital Events: Including procedures, antibiotic start and stop dates in addition to other pertinent events   01/31/24: H&H not improving after 2 units pRBCs, pending repeat after 4th unit pRBCs. Developed afib rvr. Placed on amiodarone  gtt. Later developed hypotension requiring levophed  gtt. Admitted to the ICU. 02/01/24- patient s/p prbc transfusion.  Patient going for EGD today. No bleeding episodes noted clinically all day today.   Interim History / Subjective:  See above listed under  significant hospital events.  Objective    Blood pressure (!) 127/55, pulse 71, temperature 98.5 F (36.9 C), temperature source Axillary, resp. rate 11, weight 123.8 kg, SpO2 100%.    FiO2 (%):  [28 %] 28 %   Intake/Output Summary (Last 24 hours) at 02/01/2024 1029 Last data filed at 02/01/2024 9180 Gross per 24 hour  Intake 1215.17 ml  Output 6650 ml  Net -5434.83 ml   Filed Weights   01/30/24 0813  Weight: 123.8 kg    Examination: General: ill appearing male, lying in bed, reporting significant pain and very emotional HENT: atraumatic, normocephalic, supple, no jvd Lungs: clear to auscultation bilaterally, equal throughout Cardiovascular: irregularly irregular, S1 S2, no m/r/g Abdomen: soft, mild distention, tenderness of left side, no rebound/guarding, bowel sounds x 4 Extremities: left forearm amputation, right leg BKA, left left AKA; all well perfused with femoral pulses present, radial pulses 2+, no edema Neuro: alert and oriented x 4 but requires redirection, no focal neuro deficits, follows commands, PERRL GU: deferred   Assessment and Plan    #Hypovolemic Shock s/t acute GIB    -s/p blood transfusion Hx: Acute on Chronic Diastolic CHF, Afib with previous cardioversion - Continuous cardiac monitoring - Vasopressors to maintain MAP goal >65 - Started on levophed  gtt - Wean pressors as able - Continue amiodarone  gtt - ECHO pending - Cardiology following; appreciate input  #Acute Metabolic Encephalopathy- resolved #Bipolar Disorder #Chronic Pain Hx: Bipolar Disorder, Anxiety, Depression - Correct metabolic derangements - Avoid sedating medications as able - Encourage family support at bedside - Control pain as able with prn Dilaudid   #Acute Hypoxic Respiratory Failure s/t Hypovolemic Shock - Supplemental oxygen  to maintain sats >92% - Currently on 4L nasal cannula - Duonebs prn - CXR as indicated  #Atrial Fibrillation with RVR   #Acute Metabolic  Acidosis s/t Hemorrhagic Shock with Lactic Acidosis #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 #Hyponatremia Hx: CKD Stage 4 - Strict I/O - Trend BMP and monitor renal function - Avoid nephrotoxins as able - Ensure renal perfusion - ICU electrolyte replacement protocol  - Pharmacy to assist with replacement - Trend lactic - ABG as indicated  #Acute Gastrointestinal Bleed Hx: Gastric Ulcers - Continue PPI - Diet: NPO - Constipation protocol prn - Antiemetics prn - Vascular following; appreciate input - CTA Abdomen and Pelvis to localize bleeding source with possible embolization   #Acute Blood Loss Anemia s/t GI Bleed - Trend CBC  - Trend H&H - Hgb 7.1 - Transfuse if <7; will receive another unit pRBCs to make 5 total - Monitor for s/sx of bleeding - Hold AC for now   #Type II Diabetes Mellitus #DKA ~ Resolved - ICU hypo/hyperglycemia protocol - SSI - CBG Q4h - AG closed, no longer requiring insulin  gtt  #Leukocytosis likely reactive in setting of Shock - Trend WBC and monitor fever curve - Blood cultures pending - Hold off on abx for now    Labs   CBC: Recent Labs  Lab 01/26/24 1525 01/27/24 1734 01/30/24 0808 01/30/24 1512 01/30/24 2220 01/31/24 0450 01/31/24 1302 01/31/24 1740 01/31/24 2313 02/01/24 0410  WBC 8.0 8.4 8.0  --  9.3 11.9*  --   --   --  11.6*  NEUTROABS 6.3 6.4 5.1  --   --   --   --   --   --   --   HGB 11.3* 11.3* 6.0*   < > 5.9* 6.3* 7.1* 7.8* 6.8* 6.8*  HCT 36.7* 36.2* 19.2*   < > 18.7* 19.5* 21.2* 23.1* 21.1* 20.9*  MCV 67.5* 67.4* 67.8*  --  71.9* 73.3*  --   --   --  77.7*  PLT 230 235 209  --  223 216  --   --   --  219   < > = values in this interval not displayed.    Basic Metabolic Panel: Recent Labs  Lab 01/27/24 1734 01/30/24 0808 01/30/24 2220 01/31/24 0317 01/31/24 1740 02/01/24 0410  NA 132* 127* 132* 131*  --  136  K 4.3 4.6 4.7 4.7  --  3.4*  CL 97* 96* 101 102  --  103  CO2 23 20* 21* 20*  --  23  GLUCOSE 381* 613* 446*  379*  --  359*  BUN 16 62* 59* 63*  --  49*  CREATININE 0.78 1.14 1.02 1.14  --  1.06  CALCIUM  9.0 8.3* 8.1* 7.9*  --  8.1*  MG  --   --   --   --  1.9 2.1  PHOS  --   --   --   --  4.4 3.9   GFR: Estimated Creatinine Clearance: 100.5 mL/min (by C-G formula based on SCr of 1.06 mg/dL). Recent Labs  Lab 01/27/24 1734 01/30/24 0808 01/30/24 2220 01/31/24 0450 01/31/24 1324 01/31/24 1740 01/31/24 2313 02/01/24 0410  PROCALCITON  --   --   --   --   --  0.26  --   --   WBC 8.4 8.0 9.3 11.9*  --   --   --  11.6*  LATICACIDVEN 1.1  --   --   --  2.0* 1.1 1.5  --  Liver Function Tests: Recent Labs  Lab 01/26/24 1525 01/27/24 1734  AST 16 18  ALT 13 12  ALKPHOS 169* 151*  BILITOT 0.4 0.3  PROT 7.4 7.3  ALBUMIN  3.5 3.6   No results for input(s): LIPASE, AMYLASE in the last 168 hours. No results for input(s): AMMONIA in the last 168 hours.  ABG    Component Value Date/Time   PHART 7.34 (L) 01/31/2024 1404   PCO2ART 40 01/31/2024 1404   PO2ART 97 01/31/2024 1404   HCO3 21.6 01/31/2024 1404   ACIDBASEDEF 3.9 (H) 01/31/2024 1404   O2SAT 98 01/31/2024 1404     Coagulation Profile: Recent Labs  Lab 01/30/24 0942  INR 1.1    Cardiac Enzymes: No results for input(s): CKTOTAL, CKMB, CKMBINDEX, TROPONINI in the last 168 hours.  HbA1C: Hemoglobin A1C  Date/Time Value Ref Range Status  06/07/2014 04:16 AM 10.4 (H) % Final    Comment:    4.0-6.0 NOTE: New Reference Range  05/07/14   06/05/2014 10:41 PM 10.4 (H) % Final    Comment:    4.0-6.0 NOTE: New Reference Range  05/07/14    Hgb A1c MFr Bld  Date/Time Value Ref Range Status  09/06/2023 10:20 AM 10.1 (H) 4.8 - 5.6 % Final    Comment:    (NOTE) Diagnosis of Diabetes The following HbA1c ranges recommended by the American Diabetes Association (ADA) may be used as an aid in the diagnosis of diabetes mellitus.  Hemoglobin             Suggested A1C NGSP%              Diagnosis  <5.7                    Non Diabetic  5.7-6.4                Pre-Diabetic  >6.4                   Diabetic  <7.0                   Glycemic control for                       adults with diabetes.    10/13/2022 02:29 AM 12.0 (H) 4.8 - 5.6 % Final    Comment:    (NOTE)         Prediabetes: 5.7 - 6.4         Diabetes: >6.4         Glycemic control for adults with diabetes: <7.0     CBG: Recent Labs  Lab 01/31/24 1835 01/31/24 1941 02/01/24 0022 02/01/24 0420 02/01/24 0852  GLUCAP 323* 273* 340* 325* 275*    Review of Systems:   Endorses left sided abdominal pain and sacral pain. Denies chest pain, palpitations, shortness of breath, headache, vision changes, nausea or vomiting.   Past Medical History:  He,  has a past medical history of Anxiety, CHF (congestive heart failure) (HCC), Chronic diastolic (congestive) heart failure (HCC), Depression, Diabetes mellitus without complication (HCC), Hyperlipemia, Hypertension, Morbid obesity (HCC), and Peripheral edema.   Surgical History:   Past Surgical History:  Procedure Laterality Date   AMPUTATION Left 09/07/2023   Procedure: AMPUTATION, ABOVE KNEE;  Surgeon: Marea Selinda RAMAN, MD;  Location: [facility] ORS;  Service: General;  Laterality: Left;   APPENDECTOMY     BELOW KNEE LEG AMPUTATION     Right  leg   BIOPSY  10/14/2022   Procedure: BIOPSY;  Surgeon: Aundria, Ladell POUR, MD;  Location: [facility] ENDOSCOPY;  Service: Gastroenterology;;   COLONOSCOPY N/A 10/16/2022   Procedure: COLONOSCOPY;  Surgeon: Therisa Bi, MD;  Location: [facility] ENDOSCOPY;  Service: Gastroenterology;  Laterality: N/A;   ESOPHAGOGASTRODUODENOSCOPY (EGD) WITH PROPOFOL  N/A 10/26/2022   Procedure: ESOPHAGOGASTRODUODENOSCOPY (EGD) WITH PROPOFOL ;  Surgeon: Maryruth Ole DASEN, MD;  Location: [facility] ENDOSCOPY;  Service: Endoscopy;  Laterality: N/A;   ESOPHAGOGASTRODUODENOSCOPY (EGD) WITH PROPOFOL  N/A 10/14/2022   Procedure: ESOPHAGOGASTRODUODENOSCOPY (EGD) WITH PROPOFOL ;  Surgeon: Toledo,  Ladell POUR, MD;  Location: [facility] ENDOSCOPY;  Service: Gastroenterology;  Laterality: N/A;   INCISION AND DRAINAGE ABSCESS Left 02/27/2023   Procedure: INCISION AND DRAINAGE ABSCESS LEFT FINGER AND ARM;  Surgeon: Robbin Redell Ned, MD;  Location: [facility] ORS;  Service: Orthopedics;  Laterality: Left;   TONSILLECTOMY       Social History:   reports that he has never smoked. He has never used smokeless tobacco. He reports that he does not drink alcohol and does not use drugs.   Family History:  His family history includes CAD in his father; COPD in his mother; Diabetes in his father; Kidney cancer in his brother.   Allergies Allergies  Allergen Reactions   Fluoxetine     Other reaction(s): Hallucination   Shellfish Allergy Hives   Sulfa Antibiotics Hives   Vancomycin      Other reaction(s): Red Man Syndrome   Metformin Nausea Only     Home Medications  Prior to Admission medications   Medication Sig Start Date End Date Taking? Authorizing Provider  amoxicillin -clavulanate (AUGMENTIN ) 875-125 MG tablet Take 1 tablet by mouth 2 (two) times daily for 7 days. 01/26/24 02/02/24 Yes Ray, Nilsa, MD  aspirin  EC 81 MG tablet Take 81 mg by mouth.  Take 1 tablet (81 mg total) by mouth once daily 04/22/23 04/22/24 Yes [provider]  atorvastatin  (LIPITOR) 40 MG tablet Take 40 mg by mouth daily.    Yes [provider]  baclofen  (LIORESAL ) 10 MG tablet Take 15 mg by mouth every 8 (eight) hours. 06/30/23  Yes [provider]  cetirizine (ZYRTEC) 10 MG tablet Take 10 mg by mouth daily as needed for allergies. 06/23/20  Yes [provider]  DULoxetine  (CYMBALTA ) 30 MG capsule Take 90 mg by mouth daily.   Yes [provider]  finasteride  (PROSCAR ) 5 MG tablet Take 5 mg by mouth daily. 08/17/23  Yes [provider]  furosemide  (LASIX ) 80 MG tablet Take 80 mg by mouth 2 (two) times daily. 12/08/22  Yes [provider]  gabapentin  (NEURONTIN ) 600 MG  tablet Take 600 mg by mouth 3 (three) times daily. 06/30/23  Yes [provider]  insulin  degludec (TRESIBA) 200 UNIT/ML FlexTouch Pen Inject 20 Units into the skin at bedtime. 11/11/23  Yes [provider]  insulin  glargine, 1 Unit Dial, (TOUJEO  SOLOSTAR) 300 UNIT/ML Solostar Pen Inject 20 Units into the skin at bedtime.   Yes [provider]  insulin  lispro (HUMALOG) 100 UNIT/ML KwikPen Inject 20 Units into the skin 3 (three) times daily. 11/11/23 11/10/24 Yes [provider]  loperamide (IMODIUM A-D) 2 MG tablet Take 2 mg by mouth as needed for diarrhea or loose stools.   Yes [provider]  melatonin 5 MG TABS Take 1 tablet (5 mg total) by mouth at bedtime. 11/16/22  Yes Awanda City, MD  metoprolol  succinate (TOPROL -XL) 50 MG 24 hr tablet Take 50 mg by mouth at  bedtime. 01/05/24  Yes [provider]  MOUNJARO 2.5 MG/0.5ML Pen Inject 2.5 mg into the skin once a week. 11/11/23  Yes [provider]  MOVANTIK 25 MG TABS tablet Take 25 mg by mouth daily. 10/18/23  Yes [provider]  nitrofurantoin  (MACRODANTIN ) 50 MG capsule Take 50 mg by mouth every 6 (six) hours. 10/26/23  Yes [provider]  oxyCODONE  (ROXICODONE ) 15 MG immediate release tablet Take 15 mg by mouth every 6 (six) hours as needed.   Yes [provider]  oxyCODONE  (ROXICODONE ) 15 MG immediate release tablet Take 1 tablet (15 mg total) by mouth every 6 (six) hours as needed for pain. 10/09/23  Yes Dorothyann Drivers, MD  potassium chloride  (MICRO-K ) 10 MEQ CR capsule Take by mouth 2 (two) times daily. :1 Tablet(s) By Mouth Twice Daily   Yes [provider]  pregabalin  (LYRICA ) 150 MG capsule Take 150 mg by mouth 3 (three) times daily. 08/15/23  Yes [provider]  senna (SENOKOT) 8.6 MG TABS tablet Take 2 tablets by mouth daily.   Yes [provider]  senna-docusate (SENOKOT-S) 8.6-50 MG tablet Take 2 tablets by mouth 2 (two) times  daily. 09/16/23  Yes [provider]  tamsulosin  (FLOMAX ) 0.4 MG CAPS capsule Take 0.8 mg by mouth daily.   Yes [provider]  acetaminophen  (TYLENOL ) 325 MG tablet Take 2 tablets (650 mg total) by mouth every 6 (six) hours as needed for mild pain (pain score 1-3) (or Fever >/= 101). 03/02/23   Barbarann Nest, MD  bisacodyl  (DULCOLAX) 10 MG suppository Place 10 mg rectally daily as needed for moderate constipation.    [provider]  buPROPion  (WELLBUTRIN  XL) 150 MG 24 hr tablet Take 1 tablet (150 mg total) by mouth daily. 11/06/22 10/06/23  Trudy Anthony HERO, MD  diclofenac Sodium (VOLTAREN) 1 % GEL Apply 2 g topically daily as needed (for pain).    [provider]  diphenhydrAMINE  (BENADRYL ) 25 mg capsule Take 1 capsule (25 mg total) by mouth every 6 (six) hours as needed for allergies or itching (30 minutes prior to starting Vancomycin ). Patient not taking: Reported on 10/06/2023 03/02/23   Barbarann Nest, MD  HUMULIN  R 100 UNIT/ML injection Inject 0.1 mLs (10 Units total) into the skin 3 (three) times daily with meals. 11/16/22   Awanda City, MD  mupirocin  ointment (BACTROBAN ) 2 % Apply to penis topically as needed.    [provider]  oxymetazoline  (AFRIN) 0.05 % nasal spray Place 1 spray into both nostrils 2 (two) times daily as needed for congestion.    [provider]  polyethylene glycol (MIRALAX  / GLYCOLAX ) 17 g packet Take 17 g by mouth daily as needed. 11/16/22   Awanda City, MD     Critical care provider statement:   Total critical care time: 33 minutes   Performed by: Parris MD   Critical care time was exclusive of separately billable procedures and treating other patients.   Critical care was necessary to treat or prevent imminent or life-threatening deterioration.   Critical care was time spent personally by me on the following activities: development of treatment plan with patient and/or surrogate as well as nursing, discussions  with consultants, evaluation of patient's response to treatment, examination of patient, obtaining history from patient or surrogate, ordering and performing treatments and interventions, ordering and review of laboratory studies, ordering and review of radiographic studies, pulse oximetry and re-evaluation of patient's condition.    Jnaya Butrick, M.D.  Pulmonary &  Critical Care Medicine

## 2024-02-01 NOTE — Progress Notes (Signed)
*  PRELIMINARY RESULTS* Echocardiogram 2D Echocardiogram has been performed.  Russell [name] 02/01/2024, 8:48 AM

## 2024-02-01 NOTE — Progress Notes (Signed)
 [facility] CLINIC CARDIOLOGY PROGRESS NOTE       Patient ID: Russell [name] MRN: [id number] DOB/AGE: Feb 28, 1966 58 y.o.  Admit date: 01/30/2024 Referring Physician Dr. Laneta Blunt Primary Physician Olmedo, [name], MD  Primary Cardiologist [name] (previously seen by [name], [name]) Reason for Consultation AF RVR  HPI: NEVEN FINA is a 58 y.o. male  with a past medical history of paroxysmal atrial fibrillation not on AC, chronic diastolic heart failure, hx R BKA and L AKA, L forearm amputation, hx GIB 2/2 peptic ulcer who presented to the ED on 01/30/2024 for back pain, black tarry stool. Given transfusion overnight for significant anemia. Went into AF RVR this AM. Cardiology was consulted for further evaluation.   Interval history: -Patient seen and examined this AM, resting comfortably in hospital bed with family at bedside.  -States he is feeling better overall today. Got some pain medication earlier which helped his back and side pain.  -HR improved now in NSR on IV amio.  -Hgb 6.8 on AM labs.  Review of systems complete and found to be negative unless listed above    Past Medical History:  Diagnosis Date   Anxiety    CHF (congestive heart failure) (HCC)    EF 30-35%   Chronic diastolic (congestive) heart failure (HCC)    Depression    Diabetes mellitus without complication (HCC)    Hyperlipemia    Hypertension    Morbid obesity (HCC)    Peripheral edema     Past Surgical History:  Procedure Laterality Date   AMPUTATION Left 09/07/2023   Procedure: AMPUTATION, ABOVE KNEE;  Surgeon: Marea Selinda RAMAN, MD;  Location: [facility] ORS;  Service: General;  Laterality: Left;   APPENDECTOMY     BELOW KNEE LEG AMPUTATION     Right leg   BIOPSY  10/14/2022   Procedure: BIOPSY;  Surgeon: Aundria, Ladell POUR, MD;  Location: [facility] ENDOSCOPY;  Service: Gastroenterology;;   COLONOSCOPY N/A 10/16/2022   Procedure: COLONOSCOPY;  Surgeon: Therisa Bi, MD;  Location[facility] ENDOSCOPY;   Service: Gastroenterology;  Laterality: N/A;   ESOPHAGOGASTRODUODENOSCOPY (EGD) WITH PROPOFOL  N/A 10/26/2022   Procedure: ESOPHAGOGASTRODUODENOSCOPY (EGD) WITH PROPOFOL ;  Surgeon: Maryruth Ole DASEN, MD;  Location: [facility] ENDOSCOPY;  Service: Endoscopy;  Laterality: N/A;   ESOPHAGOGASTRODUODENOSCOPY (EGD) WITH PROPOFOL  N/A 10/14/2022   Procedure: ESOPHAGOGASTRODUODENOSCOPY (EGD) WITH PROPOFOL ;  Surgeon: Toledo, Ladell POUR, MD;  Location: [facility] ENDOSCOPY;  Service: Gastroenterology;  Laterality: N/A;   INCISION AND DRAINAGE ABSCESS Left 02/27/2023   Procedure: INCISION AND DRAINAGE ABSCESS LEFT FINGER AND ARM;  Surgeon: Robbin Redell Ned, MD;  Location: [facility] ORS;  Service: Orthopedics;  Laterality: Left;   TONSILLECTOMY      Medications Prior to Admission  Medication Sig Dispense Refill Last Dose/Taking   amoxicillin -clavulanate (AUGMENTIN ) 875-125 MG tablet Take 1 tablet by mouth 2 (two) times daily for 7 days. 14 tablet 0 Unknown   aspirin  EC 81 MG tablet Take 81 mg by mouth.  Take 1 tablet (81 mg total) by mouth once daily   Unknown   atorvastatin  (LIPITOR) 40 MG tablet Take 40 mg by mouth daily.    Unknown   baclofen  (LIORESAL ) 10 MG tablet Take 15 mg by mouth every 8 (eight) hours.   Unknown   cetirizine (ZYRTEC) 10 MG tablet Take 10 mg by mouth daily as needed for allergies.   Unknown   DULoxetine  (CYMBALTA ) 30 MG capsule Take 90 mg by mouth daily.   Unknown   finasteride  (PROSCAR ) 5  MG tablet Take 5 mg by mouth daily.   Unknown   furosemide  (LASIX ) 80 MG tablet Take 80 mg by mouth 2 (two) times daily.   Unknown   gabapentin  (NEURONTIN ) 600 MG tablet Take 600 mg by mouth 3 (three) times daily.   Unknown   insulin  degludec (TRESIBA) 200 UNIT/ML FlexTouch Pen Inject 20 Units into the skin at bedtime.   Unknown   insulin  glargine, 1 Unit Dial, (TOUJEO  SOLOSTAR) 300 UNIT/ML Solostar Pen Inject 20 Units into the skin at bedtime.   Unknown   insulin  lispro (HUMALOG) 100 UNIT/ML KwikPen Inject 20  Units into the skin 3 (three) times daily.   Unknown   loperamide (IMODIUM A-D) 2 MG tablet Take 2 mg by mouth as needed for diarrhea or loose stools.   Unknown   melatonin 5 MG TABS Take 1 tablet (5 mg total) by mouth at bedtime.   Unknown   metoprolol  succinate (TOPROL -XL) 50 MG 24 hr tablet Take 50 mg by mouth at bedtime.   Unknown   MOUNJARO 2.5 MG/0.5ML Pen Inject 2.5 mg into the skin once a week.   Unknown   MOVANTIK 25 MG TABS tablet Take 25 mg by mouth daily.   Unknown   nitrofurantoin  (MACRODANTIN ) 50 MG capsule Take 50 mg by mouth every 6 (six) hours.   Unknown   oxyCODONE  (ROXICODONE ) 15 MG immediate release tablet Take 15 mg by mouth every 6 (six) hours as needed.   Unknown   oxyCODONE  (ROXICODONE ) 15 MG immediate release tablet Take 1 tablet (15 mg total) by mouth every 6 (six) hours as needed for pain. 30 tablet 0 Unknown   potassium chloride  (MICRO-K ) 10 MEQ CR capsule Take by mouth 2 (two) times daily. :1 Tablet(s) By Mouth Twice Daily   Unknown   pregabalin  (LYRICA ) 150 MG capsule Take 150 mg by mouth 3 (three) times daily.   Unknown   senna (SENOKOT) 8.6 MG TABS tablet Take 2 tablets by mouth daily.   Unknown   senna-docusate (SENOKOT-S) 8.6-50 MG tablet Take 2 tablets by mouth 2 (two) times daily.   Unknown   tamsulosin  (FLOMAX ) 0.4 MG CAPS capsule Take 0.8 mg by mouth daily.   Unknown   acetaminophen  (TYLENOL ) 325 MG tablet Take 2 tablets (650 mg total) by mouth every 6 (six) hours as needed for mild pain (pain score 1-3) (or Fever >/= 101).   Unknown   bisacodyl  (DULCOLAX) 10 MG suppository Place 10 mg rectally daily as needed for moderate constipation.   Unknown   buPROPion  (WELLBUTRIN  XL) 150 MG 24 hr tablet Take 1 tablet (150 mg total) by mouth daily. 30 tablet 0    diclofenac Sodium (VOLTAREN) 1 % GEL Apply 2 g topically daily as needed (for pain).   Unknown   diphenhydrAMINE  (BENADRYL ) 25 mg capsule Take 1 capsule (25 mg total) by mouth every 6 (six) hours as needed for  allergies or itching (30 minutes prior to starting Vancomycin ). (Patient not taking: Reported on 10/06/2023)   Unknown   HUMULIN  R 100 UNIT/ML injection Inject 0.1 mLs (10 Units total) into the skin 3 (three) times daily with meals.   Unknown   mupirocin  ointment (BACTROBAN ) 2 % Apply to penis topically as needed.   Unknown   oxymetazoline  (AFRIN) 0.05 % nasal spray Place 1 spray into both nostrils 2 (two) times daily as needed for congestion.   Unknown   polyethylene glycol (MIRALAX  / GLYCOLAX ) 17 g packet Take 17 g by mouth daily as  needed.   Unknown   Social History   Socioeconomic History   Marital status: Legally Separated    Spouse name: Not on file   Number of children: Not on file   Years of education: Not on file   Highest education level: Not on file  Occupational History   Not on file  Tobacco Use   Smoking status: Never   Smokeless tobacco: Never  Vaping Use   Vaping status: Never Used  Substance and Sexual Activity   Alcohol use: No   Drug use: No   Sexual activity: Not Currently  Other Topics Concern   Not on file  Social History Narrative   Living with mother now   Wheelchair bound at baseline   Social Drivers of Health   Financial Resource Strain: Low Risk  (04/04/2023)   Received from [facility]   Overall Financial Resource Strain (CARDIA)    Difficulty of Paying Living Expenses: Not hard at all  Food Insecurity: Unknown (09/07/2023)   Hunger Vital Sign    Worried About Running Out of Food in the Last Year: Not on file    Ran Out of Food in the Last Year: Patient declined  Transportation Needs: No Transportation Needs (09/07/2023)   PRAPARE - Administrator, Civil Service (Medical): No    Lack of Transportation (Non-Medical): No  Physical Activity: Not on file  Stress: Not on file  Social Connections: Not on file  Intimate Partner Violence: Not At Risk (09/07/2023)   Humiliation, Afraid, Rape, and Kick questionnaire    Fear of  Current or Ex-Partner: No    Emotionally Abused: No    Physically Abused: No    Sexually Abused: No    Family History  Problem Relation Age of Onset   COPD Mother    Diabetes Father    CAD Father    Kidney cancer Brother      Vitals:   02/01/24 0745 02/01/24 0800 02/01/24 0830 02/01/24 0845  BP: (!) 142/54 (!) 125/53 (!) 123/55 (!) 129/54  Pulse: 73 73 71 71  Resp: 19 11 12 12   Temp:    98.5 F (36.9 C)  TempSrc:    Oral  SpO2: 100% 100% 100%   Weight:        PHYSICAL EXAM General: Chronically ill appearing male, well nourished, in no acute distress. HEENT: Normocephalic and atraumatic. Neck: No JVD.  Lungs: Normal respiratory effort on 2L . Clear bilaterally to auscultation. No wheezes, crackles, rhonchi.  Heart: HRRR. Normal S1 and S2 without gallops or murmurs.  Abdomen: Non-distended appearing.  Msk: Normal strength and tone for age. Extremities: S/p L AKA and R BKA. Neuro: Alert and oriented X 3. Somnolent. Psych: Answers questions appropriately.   Labs: Basic Metabolic Panel: Recent Labs    01/31/24 0317 01/31/24 1740 02/01/24 0410  NA 131*  --  136  K 4.7  --  3.4*  CL 102  --  103  CO2 20*  --  23  GLUCOSE 379*  --  359*  BUN 63*  --  49*  CREATININE 1.14  --  1.06  CALCIUM  7.9*  --  8.1*  MG  --  1.9 2.1  PHOS  --  4.4 3.9   Liver Function Tests: No results for input(s): AST, ALT, ALKPHOS, BILITOT, PROT, ALBUMIN  in the last 72 hours. No results for input(s): LIPASE, AMYLASE in the last 72 hours. CBC: Recent Labs    01/30/24 0808 01/30/24 1512  01/31/24 0450 01/31/24 1302 01/31/24 2313 02/01/24 0410  WBC 8.0   < > 11.9*  --   --  11.6*  NEUTROABS 5.1  --   --   --   --   --   HGB 6.0*   < > 6.3*   < > 6.8* 6.8*  HCT 19.2*   < > 19.5*   < > 21.1* 20.9*  MCV 67.8*   < > 73.3*  --   --  77.7*  PLT 209   < > 216  --   --  219   < > = values in this interval not displayed.   Cardiac Enzymes: No results for input(s):  CKTOTAL, CKMB, CKMBINDEX, TROPONINIHS in the last 72 hours. BNP: No results for input(s): BNP in the last 72 hours. D-Dimer: No results for input(s): DDIMER in the last 72 hours. Hemoglobin A1C: No results for input(s): HGBA1C in the last 72 hours. Fasting Lipid Panel: No results for input(s): CHOL, HDL, LDLCALC, TRIG, CHOLHDL, LDLDIRECT in the last 72 hours. Thyroid  Function Tests: No results for input(s): TSH, T4TOTAL, T3FREE, THYROIDAB in the last 72 hours.  Invalid input(s): FREET3 Anemia Panel: Recent Labs    01/30/24 0808  FERRITIN 26  TIBC 280  IRON  26*  RETICCTPCT 2.3     Radiology: CT Angio Abd/Pel w/ and/or w/o Result Date: 01/31/2024 CLINICAL DATA:  Persistent GI bleed with melena. EXAM: CT ANGIOGRAPHY ABDOMEN AND PELVIS WITH CONTRAST AND WITHOUT CONTRAST TECHNIQUE: Multidetector CT imaging of the abdomen and pelvis was performed using the standard protocol during bolus administration of intravenous contrast. Multiplanar reconstructed images and MIPs were obtained and reviewed to evaluate the vascular anatomy. RADIATION DOSE REDUCTION: This exam was performed according to the departmental dose-optimization program which includes automated exposure control, adjustment of the mA and/or kV according to patient size and/or use of iterative reconstruction technique. CONTRAST:  OMNIPAQUE  IOHEXOL  350 MG/ML SOLN COMPARISON:  CT abdomen pelvis 10/24/2022 FINDINGS: VASCULAR Aorta: Normal abdominal aorta with normal caliber and no significant atherosclerosis. No dissection. Celiac: Normally patent. Normally patent branch vessels and branch anatomy. SMA: Normally patent. Renals: Normally patent bilateral single renal arteries. IMA: Normally patent IMA. Inflow: Normally patent bilateral iliac arteries. Proximal Outflow: Normally patent bilateral common femoral arteries and femoral bifurcations. Veins: Venous phase imaging demonstrates normal patency of  venous structures in the abdomen and pelvis. No mesenteric venous thrombosis. The portal vein is normally patent. No gastric or esophageal varices identified. Review of the MIP images confirms the above findings. NON-VASCULAR Lower chest: No acute abnormality.  No hiatal hernia. Hepatobiliary: Underlying hepatic steatosis. No overt cirrhosis. No hepatic masses or biliary dilatation. The gallbladder is unremarkable. Pancreas: Diffusely atrophic pancreas without mass or inflammation. No pancreatic ductal dilatation. Spleen: Normal in size without focal abnormality. Adrenals/Urinary Tract: Normal adrenal glands. Kidneys demonstrate no hydronephrosis, focal lesion or calculi bilaterally. The urinary bladder is tremendously distended measuring up to roughly 20.7 x 14 x 15.1 cm for estimated volume of 2275 mL. Stomach/Bowel: Stomach and duodenum demonstrate no evidence of ulcer, mass or lesion. No evidence of active bleeding into the gastrointestinal tract on arterial or venous phases of imaging. Bowel shows no evidence of obstruction, ileus, inflammation or lesion. The appendix is surgically absent. No free intraperitoneal air. Lymphatic: No enlarged lymph nodes identified. Reproductive: Prostate is unremarkable. Other: Irregular subcutaneous soft tissue density in the subcutaneous fat of the upper right abdomen at roughly the level of the gallbladder may relate to a subcutaneous  injection site or focal trauma. No ascites. Musculoskeletal: Degenerative disc disease of the lumbar spine, most severely at L5-S1. IMPRESSION: 1. No evidence of active bleeding into the gastrointestinal tract on arterial or venous phases of imaging. 2. No evidence of peptic ulcer disease of the upper gastrointestinal tract by CT or upper tract varices. 3. Hepatic steatosis. 4. Tremendously distended urinary bladder with estimated volume of 2275 mL. 5. Irregular subcutaneous soft tissue density in the subcutaneous fat of the upper right abdomen  at roughly the level of the gallbladder may relate to a subcutaneous injection site or focal trauma. Electronically Signed   By: Marcey Moan M.D.   On: 01/31/2024 18:47   DG Chest Port 1 View Result Date: 01/31/2024 EXAM: 1 VIEW(S) XRAY OF THE CHEST 01/31/2024 03:05:27 PM COMPARISON: None available. CLINICAL HISTORY: Encounter for central line placement. FINDINGS: LINES, TUBES AND DEVICES: Interval placement of right internal jugular catheter with distal tip in expected position of SVC. LUNGS AND PLEURA: No focal pulmonary opacity. No pleural effusion. No pneumothorax. HEART AND MEDIASTINUM: The distal tip of the right internal jugular catheter is in the expected position of the SVC. No acute abnormality of the cardiac and mediastinal silhouettes. BONES AND SOFT TISSUES: No acute osseous abnormality. IMPRESSION: 1. Right internal jugular catheter with the distal tip in the expected position within the superior vena cava. Electronically signed by: Lynwood Seip MD 01/31/2024 03:26 PM EST RP Workstation: [id number]   DG Chest Port 1 View Result Date: 01/31/2024 EXAM: 1 VIEW(S) XRAY OF THE CHEST 01/31/2024 10:43:58 AM COMPARISON: 09/06/2023 CLINICAL HISTORY: SOB (shortness of breath); Atrial fibrillation with RVR (HCC) FINDINGS: LUNGS AND PLEURA: Low lung volumes. No focal pulmonary opacity. No pleural effusion. No pneumothorax. HEART AND MEDIASTINUM: No acute abnormality of the cardiac and mediastinal silhouettes. BONES AND SOFT TISSUES: No acute osseous abnormality. IMPRESSION: 1. Low lung volumes, No acute cardiopulmonary process. Electronically signed by: Katheleen Faes MD 01/31/2024 10:58 AM EST RP Workstation: [id number]    ECHO pending  TELEMETRY (personally reviewed): NSR rate 70s  EKG (personally reviewed): none for review this admission  Data reviewed by me 02/01/2024: last 24h vitals tele labs imaging I/O ED provider note, admission H&P, hospitalist progress note  Principal Problem:   GI  bleed Active Problems:   Acute blood loss anemia   Melena   Hematochezia    ASSESSMENT AND PLAN:  RAUL TORRANCE is a 58 y.o. male  with a past medical history of paroxysmal atrial fibrillation not on AC, chronic diastolic heart failure, hx R BKA and L AKA, L forearm amputation, hx GIB 2/2 peptic ulcer who presented to the ED on 01/30/2024 for back pain, black tarry stool. Given transfusion overnight for significant anemia. Went into AF RVR this AM. Cardiology was consulted for further evaluation.   # Atrial fibrillation RVR # Paroxysmal atrial fibrillation # Suspected acute on chronic HFpEF # GI bleed, ABLA Patient presented with back pain and episode of black tarry stool, found to have Hgb down to 6.0 from 11.3 three days prior. Transfused overnight with not much response. 01/31/2024 converted to AF RVR.  -Echo pending, further recommendations pending these results. -Continue IV amiodarone  infusion.  -Ultimately patient would benefit from anticoagulation once bleeding managed and Hgb stabilized. -Defer further lasix  dosing. -Continue aspirin  81 mg daily, atorvastatin  80 mg daily.  -GI following, appreciate recs.  -Further management per primary team, GI.   This patient's plan of care was discussed and created with Dr. Florencio and he is  in agreement.  Signed: Danita Bloch, PA-C  02/01/2024, 8:50 AM [facility] Cardiology

## 2024-02-01 NOTE — TOC Initial Note (Signed)
 Transition of Care [facility]) - Initial/Assessment Note    Patient Details  Name: Russell [name] MRN: [id number] Date of Birth: 1965/09/15  [facility] CM/SW Contact:    Corrie JINNY Ruts, LCSW Phone Number: 02/01/2024, 3:26 PM  Clinical Narrative:                 Chart reviewed. I received a consult for motorized chair does not work and patient is unable to get medications with out mobility of wheel chair. I was able to speak with the patient at bedside today. The patient reports that he does not have a PCP. Patient was interested in PCP resources. The patient reports that he lives alone but can not go back into the home because he ruined it. SW offered patient shelter resources. The patient declined and said who would want to take me, I dont have a arm or leg The patient was interested in SNF placement.   The patient reports that he would use public transportation or insurance transportation to get around. The patient reports that his ex wife will assist him during D/C. The patient reports that he uses [facility]. The patient reports that her has [facility] and has been a residence of [facility] in the past. The patient reports that he would like to return to [facility]. The patient consented to sending out SNF referrals. The patient reports that her uses a motorized wheel chair that is broken. The patient reports that he go the wheel chair from [facility] and has been calling them all day.   SW will add PCP resources to AVS. ICM will follow patient until D/C/     Barriers to Discharge: Continued Medical Work up   Patient Goals and CMS Choice            Expected Discharge Plan and Services                                              Prior Living Arrangements/Services   Lives with:: Self Patient language and need for interpreter reviewed:: Yes        Need for Family Participation in Patient Care: Yes (Comment)      Criminal Activity/Legal Involvement Pertinent to Current Situation/Hospitalization: No - Comment as needed  Activities of Daily Living      Permission Sought/Granted                  Emotional Assessment Appearance:: Appears stated age Attitude/Demeanor/Rapport: Gracious Affect (typically observed): Calm Orientation: : Oriented to Self, Oriented to Place, Oriented to  Time, Oriented to Situation Alcohol / Substance Use: Not Applicable Psych Involvement: No (comment)  Admission diagnosis:  Blood loss anemia [D50.0] Upper GI bleeding [K92.2] GI bleed [K92.2] Hyperglycemia [R73.9] Patient Active Problem List   Diagnosis Date Noted   DU (duodenal ulcer) 02/01/2024   Blood in stool 02/01/2024   Hematochezia 01/31/2024   GI bleed 01/30/2024   Melena 01/30/2024   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) 09/09/2023   Acute osteomyelitis of metatarsal bone of left foot (HCC) 09/06/2023   OSA (obstructive sleep apnea) 09/06/2023   Chronic respiratory failure with hypoxia (HCC) 09/06/2023   Sepsis due to cellulitis (HCC) 02/27/2023   PAD (peripheral artery disease) 11/10/2022   Generalized weakness 11/09/2022   Ambulatory  dysfunction 11/09/2022   Residual hemorrhoidal skin tags 11/06/2022   Acute gastritis 11/06/2022   Major depressive disorder, recurrent episode, moderate (HCC) 10/28/2022   Dyslipidemia 10/24/2022   Anxiety and depression 10/24/2022   Essential hypertension 10/24/2022   Type 2 diabetes mellitus with peripheral neuropathy (HCC) 10/24/2022   Acute gastric ulcer with hemorrhage 10/24/2022   Acute urinary retention 10/17/2022   Acute respiratory failure with hypoxia (HCC) 10/15/2022   Multiple gastric ulcers 10/14/2022   Hyperosmolar hyperglycemic state (HHS) (HCC) 10/13/2022   HLD (hyperlipidemia) 10/13/2022   Acute on chronic diastolic CHF (congestive heart failure) (HCC) 10/13/2022   Depression with anxiety 10/13/2022    Hyperkalemia 10/13/2022   Elevated lactic acid level 10/13/2022   Rectal bleeding 10/13/2022   Acute blood loss anemia 10/13/2022   Wound infection_ left lower leg 10/13/2022   Upper GI bleed 10/13/2022   Hypovolemic shock (HCC) 10/13/2022   Epigastric abdominal pain 10/13/2022   Sciatica of right side 10/13/2022   AKI (acute kidney injury) 10/13/2022   Hypotension 11/21/2020   Atrial flutter with rapid ventricular response (HCC) 11/21/2020   Chronic kidney disease, stage 4, severely decreased GFR (HCC) 06/14/2018   Chronic venous insufficiency 06/14/2018   Cellulitis of left lower extremity 02/01/2018   Cellulitis 05/18/2017   Hyperlipidemia, mixed 01/10/2017   Cellulitis and abscess of left leg 08/08/2016   Diabetes with ulcer of leg (HCC) 08/08/2016   Hyperglycemia 08/08/2016   Morbid obesity (HCC) 08/08/2016   Cellulitis and abscess of leg 08/08/2016   Adjustment disorder with depressed mood 11/21/2014   Other social stressor 11/21/2014   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 ([address]), [place] - [address] [place] ([place]) [place] [address] Phone: [phone] Fax: [phone]     Social Drivers of Health (SDOH) Social History: SDOH Screenings   Food Insecurity: Unknown (09/07/2023)  Housing: Low Risk  (09/07/2023)  Transportation Needs: No Transportation Needs (09/07/2023)  Utilities: Not At Risk (09/07/2023)  Financial Resource Strain: Low Risk  (04/04/2023)   Received from [facility]  Tobacco Use: Low Risk  (01/27/2024)   SDOH Interventions:     Readmission Risk Interventions    03/03/2023   12:27 PM 10/25/2022    3:55 PM  Readmission Risk Prevention Plan  Transportation Screening Complete Complete  PCP or Specialist Appt within 3-5 Days  Complete  HRI or Home Care Consult  Complete  Social Work  Consult for Recovery Care Planning/Counseling  Complete  Palliative Care Screening  Not Applicable  Medication Review [employer]) Complete Complete  PCP or Specialist appointment within 3-5 days of discharge Complete   HRI or Home Care Consult Complete   Palliative Care Screening Not Applicable   Skilled Nursing Facility Not Applicable

## 2024-02-02 ENCOUNTER — Inpatient Hospital Stay

## 2024-02-02 ENCOUNTER — Encounter: Payer: Self-pay | Admitting: Gastroenterology

## 2024-02-02 DIAGNOSIS — D500 Iron deficiency anemia secondary to blood loss (chronic): Secondary | ICD-10-CM

## 2024-02-02 DIAGNOSIS — K921 Melena: Secondary | ICD-10-CM | POA: Diagnosis not present

## 2024-02-02 DIAGNOSIS — K269 Duodenal ulcer, unspecified as acute or chronic, without hemorrhage or perforation: Secondary | ICD-10-CM

## 2024-02-02 DIAGNOSIS — F319 Bipolar disorder, unspecified: Secondary | ICD-10-CM | POA: Insufficient documentation

## 2024-02-02 LAB — CBC
HCT: 23 % — ABNORMAL LOW (ref 39.0–52.0)
Hemoglobin: 7.4 g/dL — ABNORMAL LOW (ref 13.0–17.0)
MCH: 26.4 pg (ref 26.0–34.0)
MCHC: 32.2 g/dL (ref 30.0–36.0)
MCV: 82.1 fL (ref 80.0–100.0)
Platelets: 208 K/uL (ref 150–400)
RBC: 2.8 MIL/uL — ABNORMAL LOW (ref 4.22–5.81)
RDW: 21 % — ABNORMAL HIGH (ref 11.5–15.5)
WBC: 11.8 K/uL — ABNORMAL HIGH (ref 4.0–10.5)
nRBC: 0 % (ref 0.0–0.2)

## 2024-02-02 LAB — HEMOGLOBIN AND HEMATOCRIT, BLOOD
HCT: 23.2 % — ABNORMAL LOW (ref 39.0–52.0)
HCT: 22.9 % — ABNORMAL LOW (ref 39.0–52.0)
HCT: 22.5 % — ABNORMAL LOW (ref 39.0–52.0)
HCT: 25.2 % — ABNORMAL LOW (ref 39.0–52.0)
Hemoglobin: 7.4 g/dL — ABNORMAL LOW (ref 13.0–17.0)
Hemoglobin: 7.4 g/dL — ABNORMAL LOW (ref 13.0–17.0)
Hemoglobin: 7.1 g/dL — ABNORMAL LOW (ref 13.0–17.0)
Hemoglobin: 7.9 g/dL — ABNORMAL LOW (ref 13.0–17.0)

## 2024-02-02 LAB — BASIC METABOLIC PANEL WITH GFR
Anion gap: 7 (ref 5–15)
BUN: 20 mg/dL (ref 6–20)
CO2: 25 mmol/L (ref 22–32)
Calcium: 7.9 mg/dL — ABNORMAL LOW (ref 8.9–10.3)
Chloride: 106 mmol/L (ref 98–111)
Creatinine, Ser: 0.67 mg/dL (ref 0.61–1.24)
GFR, Estimated: >60 mL/min (ref >60)
Glucose, Bld: 125 mg/dL — ABNORMAL HIGH (ref 70–99)
Potassium: 3.3 mmol/L — ABNORMAL LOW (ref 3.5–5.1)
Sodium: 138 mmol/L (ref 135–145)

## 2024-02-02 LAB — PREPARE FRESH FROZEN PLASMA

## 2024-02-02 LAB — BPAM FFP
Blood Product Expiration Date: 202512082359
ISSUE DATE / TIME: 202512031318
Unit Type and Rh: 5100

## 2024-02-02 LAB — GLUCOSE, CAPILLARY
Glucose-Capillary: 139 mg/dL — ABNORMAL HIGH (ref 70–99)
Glucose-Capillary: 140 mg/dL — ABNORMAL HIGH (ref 70–99)
Glucose-Capillary: 169 mg/dL — ABNORMAL HIGH (ref 70–99)
Glucose-Capillary: 183 mg/dL — ABNORMAL HIGH (ref 70–99)
Glucose-Capillary: 227 mg/dL — ABNORMAL HIGH (ref 70–99)
Glucose-Capillary: 232 mg/dL — ABNORMAL HIGH (ref 70–99)

## 2024-02-02 LAB — PREPARE RBC (CROSSMATCH)

## 2024-02-02 LAB — MAGNESIUM: Magnesium: 2.1 mg/dL (ref 1.7–2.4)

## 2024-02-02 LAB — MRSA NEXT GEN BY PCR, NASAL: MRSA by PCR Next Gen: NOT DETECTED

## 2024-02-02 LAB — PHOSPHORUS: Phosphorus: 3.1 mg/dL (ref 2.5–4.6)

## 2024-02-02 MED ORDER — POTASSIUM CHLORIDE 20 MEQ PO PACK
40.0000 meq | PACK | Freq: Once | ORAL | Status: AC
  Administered 2024-02-02: 40 meq via ORAL
  Filled 2024-02-02: qty 2

## 2024-02-02 MED ORDER — DULOXETINE HCL 30 MG PO CPEP
90.0000 mg | ORAL_CAPSULE | Freq: Every day | ORAL | Status: DC
  Administered 2024-02-02 – 2024-02-09 (×8): 90 mg via ORAL
  Filled 2024-02-02 (×8): qty 3

## 2024-02-02 MED ORDER — AMIODARONE HCL 200 MG PO TABS
400.0000 mg | ORAL_TABLET | Freq: Two times a day (BID) | ORAL | Status: AC
  Administered 2024-02-02 – 2024-02-08 (×14): 400 mg via ORAL
  Filled 2024-02-02 (×14): qty 2

## 2024-02-02 MED ORDER — SALINE SPRAY 0.65 % NA SOLN
1.0000 | NASAL | Status: DC | PRN
  Administered 2024-02-02: 1 via NASAL
  Filled 2024-02-02: qty 44

## 2024-02-02 MED ORDER — LINEZOLID 600 MG/300ML IV SOLN
600.0000 mg | Freq: Two times a day (BID) | INTRAVENOUS | Status: DC
  Administered 2024-02-02 – 2024-02-07 (×10): 600 mg via INTRAVENOUS
  Filled 2024-02-02 (×10): qty 300

## 2024-02-02 MED ORDER — ORAL CARE MOUTH RINSE: 15.0000 mL | OROMUCOSAL | Status: DC | PRN

## 2024-02-02 MED ORDER — AMIODARONE HCL 200 MG PO TABS
200.0000 mg | ORAL_TABLET | Freq: Every day | ORAL | Status: DC
  Administered 2024-02-09: 200 mg via ORAL
  Filled 2024-02-02: qty 1

## 2024-02-02 MED ORDER — PIPERACILLIN-TAZOBACTAM 3.375 G IVPB
3.3750 g | Freq: Three times a day (TID) | INTRAVENOUS | Status: DC
  Administered 2024-02-02 – 2024-02-08 (×16): 3.375 g via INTRAVENOUS
  Filled 2024-02-02 (×16): qty 50

## 2024-02-02 MED ORDER — SODIUM CHLORIDE 0.9% IV SOLUTION: Freq: Once | INTRAVENOUS | Status: DC

## 2024-02-02 NOTE — Progress Notes (Signed)
 NAME:  Russell [name], MRN:  [id number], DOB:  [birth year], LOS: 3 ADMISSION DATE:  01/30/2024, CONSULTATION DATE:  01/31/2024 REFERRING MD:  Dr. Laneta Blunt, CHIEF COMPLAINT:  GI bleed   History of Present Illness:  Russell [name] is a 58 year old male with a medical history significant for peripheral vascular disease s/p right sided BKA, left sided AKA, left sided forearm amputation, chronic HFrEF with recovered LVEF, Type II Diabetes Mellitus, BPH, hypertension, hyperlipidemia and a GI bleed secondary to peptic ulcer, who presented to [facility] ED with concern for black tarry stool. At time of arrival he had complaints of tailbone pain, reporting he has been taking [employer] powder for 3 days. Upon arrival he was hemodynamically stable with a  hemoglobin was 6.0.   ED Course Vitals on Arrival: Temp: 98.4, BP 152/77, HR 78, RR 21, SpO2 100%   Pertinent Diagnostics/Labs: Hgb: 6.0 RBC: 2.83 HCT: 19.2 Sodium: 127 CO2: 20 Glucose: 613  Medications Administered: Dilaudid  1mg  IV >> Dilaudid  0.5mg  >> 1mg  >> 0.5mg  Protonix  80mg  LR Bolus NaCl infusion Novolog  + Semglee  Baclofen  Lyrica  150mg  Melatonin 5mg  Finasteride  Flomas Lasix  80mg  Aspirin  81mg  Amiodarone  bolus 150mg  Amiodarone  gtt 4 units pRBCs  Pertinent Medical History  Paroxysmal Atrial Fibrillation Chronic Diastolic Heart Failure Right BKA, Left AKA Left forearm amputation Hx of GIB s/t peptic ulcer  Significant Hospital Events: Including procedures, antibiotic start and stop dates in addition to other pertinent events   01/31/24: H&H not improving after 2 units pRBCs, pending repeat after 4th unit pRBCs. Developed afib rvr. Placed on amiodarone  gtt. Later developed hypotension requiring levophed  gtt. Admitted to the ICU. 02/01/24- patient s/p prbc transfusion.  Patient going for EGD today. No bleeding episodes noted clinically all day today.  02/02/24- Off levophed  this am, does have chronic pain syndrome  today worse at sacrum and gluteal area.  He has chronic opiod induced constipation. Optimizing today for step down transfer. Psychiatry consult has been placed due to psychiatric history.   Interim History / Subjective:  See above listed under significant hospital events.  Objective    Blood pressure 127/61, pulse 71, temperature 98.5 F (36.9 C), resp. rate (!) 4, weight 123.8 kg, SpO2 100%.        Intake/Output Summary (Last 24 hours) at 02/02/2024 1030 Last data filed at 02/02/2024 0803 Gross per 24 hour  Intake 2502.19 ml  Output 1835 ml  Net 667.19 ml   Filed Weights   01/30/24 0813  Weight: 123.8 kg    Examination: General: ill appearing male, lying in bed, reporting significant pain and very emotional HENT: atraumatic, normocephalic, supple, no jvd Lungs: clear to auscultation bilaterally, equal throughout Cardiovascular: irregularly irregular, S1 S2, no m/r/g Abdomen: soft, mild distention, tenderness of left side, no rebound/guarding, bowel sounds x 4 Extremities: left forearm amputation, right leg BKA, left left AKA; all well perfused with femoral pulses present, radial pulses 2+, no edema Neuro: alert and oriented x 4 but requires redirection, no focal neuro deficits, follows commands, PERRL GU: deferred   Assessment and Plan    #Hypovolemic Shock s/t acute GIB    -s/p blood transfusion Hx: Acute on Chronic Diastolic CHF, Afib with previous cardioversion - Continuous cardiac monitoring - Vasopressors to maintain MAP goal >65 - Started on levophed  gtt - Wean pressors as able - Continue amiodarone  gtt - ECHO pending - Cardiology following; appreciate input  #Acute Metabolic Encephalopathy- resolved #Bipolar Disorder #Chronic Pain Hx: Bipolar Disorder, Anxiety, Depression - Correct metabolic  derangements - Avoid sedating medications as able - Encourage family support at bedside - Control pain as able with prn Dilaudid   #Acute Hypoxic Respiratory  Failure s/t Hypovolemic Shock - Supplemental oxygen to maintain sats >92% - Currently on 4L nasal cannula - Duonebs prn - CXR as indicated  #Atrial Fibrillation with RVR   #Acute Metabolic Acidosis s/t Hemorrhagic Shock with Lactic Acidosis #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 #Hyponatremia Hx: CKD Stage 4 - Strict I/O - Trend BMP and monitor renal function - Avoid nephrotoxins as able - Ensure renal perfusion - ICU electrolyte replacement protocol  - Pharmacy to assist with replacement - Trend lactic - ABG as indicated  #Acute Gastrointestinal Bleed Hx: Gastric Ulcers - Continue PPI - Diet: NPO - Constipation protocol prn - Antiemetics prn - Vascular following; appreciate input - CTA Abdomen and Pelvis to localize bleeding source with possible embolization   #Acute Blood Loss Anemia s/t GI Bleed - Trend CBC  - Trend H&H - Hgb 7.1 - Transfuse if <7; will receive another unit pRBCs to make 5 total - Monitor for s/sx of bleeding - Hold AC for now   #Type II Diabetes Mellitus #DKA ~ Resolved - ICU hypo/hyperglycemia protocol - SSI - CBG Q4h - AG closed, no longer requiring insulin  gtt  #Leukocytosis likely reactive in setting of Shock - Trend WBC and monitor fever curve - Blood cultures pending - Hold off on abx for now    Labs   CBC: Recent Labs  Lab 01/26/24 1525 01/27/24 1734 01/30/24 0808 01/30/24 1512 01/30/24 2220 01/31/24 0450 01/31/24 1302 02/01/24 0410 02/01/24 1348 02/01/24 1847 02/02/24 0157 02/02/24 0504 02/02/24 0813  WBC 8.0 8.4 8.0  --  9.3 11.9*  --  11.6*  --   --   --  11.8*  --   NEUTROABS 6.3 6.4 5.1  --   --   --   --   --   --   --   --   --   --   HGB 11.3* 11.3* 6.0*   < > 5.9* 6.3*   < > 6.8* 7.4* 6.9* 7.4* 7.4* 7.4*  HCT 36.7* 36.2* 19.2*   < > 18.7* 19.5*   < > 20.9* 22.9* 21.8* 23.2* 23.0* 22.9*  MCV 67.5* 67.4* 67.8*  --  71.9* 73.3*  --  77.7*  --   --   --  82.1  --   PLT 230 235 209  --  223 216  --  219  --   --   --  208   --    < > = values in this interval not displayed.    Basic Metabolic Panel: Recent Labs  Lab 01/30/24 0808 01/30/24 2220 01/31/24 0317 01/31/24 1740 02/01/24 0410 02/02/24 0504  NA 127* 132* 131*  --  136 138  K 4.6 4.7 4.7  --  3.4* 3.3*  CL 96* 101 102  --  103 106  CO2 20* 21* 20*  --  23 25  GLUCOSE 613* 446* 379*  --  359* 125*  BUN 62* 59* 63*  --  49* 20  CREATININE 1.14 1.02 1.14  --  1.06 0.67  CALCIUM  8.3* 8.1* 7.9*  --  8.1* 7.9*  MG  --   --   --  1.9 2.1 2.1  PHOS  --   --   --  4.4 3.9 3.1   GFR: Estimated Creatinine Clearance: 133.2 mL/min (by C-G formula based on SCr of 0.67  mg/dL). Recent Labs  Lab 01/27/24 1734 01/30/24 0808 01/30/24 2220 01/31/24 0450 01/31/24 1324 01/31/24 1740 01/31/24 2313 02/01/24 0410 02/02/24 0504  PROCALCITON  --   --   --   --   --  0.26  --   --   --   WBC 8.4   < > 9.3 11.9*  --   --   --  11.6* 11.8*  LATICACIDVEN 1.1  --   --   --  2.0* 1.1 1.5  --   --    < > = values in this interval not displayed.    Liver Function Tests: Recent Labs  Lab 01/26/24 1525 01/27/24 1734  AST 16 18  ALT 13 12  ALKPHOS 169* 151*  BILITOT 0.4 0.3  PROT 7.4 7.3  ALBUMIN  3.5 3.6   No results for input(s): LIPASE, AMYLASE in the last 168 hours. No results for input(s): AMMONIA in the last 168 hours.  ABG    Component Value Date/Time   PHART 7.34 (L) 01/31/2024 1404   PCO2ART 40 01/31/2024 1404   PO2ART 97 01/31/2024 1404   HCO3 21.6 01/31/2024 1404   ACIDBASEDEF 3.9 (H) 01/31/2024 1404   O2SAT 98 01/31/2024 1404     Coagulation Profile: Recent Labs  Lab 01/30/24 0942  INR 1.1    Cardiac Enzymes: No results for input(s): CKTOTAL, CKMB, CKMBINDEX, TROPONINI in the last 168 hours.  HbA1C: Hemoglobin A1C  Date/Time Value Ref Range Status  06/07/2014 04:16 AM 10.4 (H) % Final    Comment:    4.0-6.0 NOTE: New Reference Range  05/07/14   06/05/2014 10:41 PM 10.4 (H) % Final    Comment:     4.0-6.0 NOTE: New Reference Range  05/07/14    Hgb A1c MFr Bld  Date/Time Value Ref Range Status  09/06/2023 10:20 AM 10.1 (H) 4.8 - 5.6 % Final    Comment:    (NOTE) Diagnosis of Diabetes The following HbA1c ranges recommended by the American Diabetes Association (ADA) may be used as an aid in the diagnosis of diabetes mellitus.  Hemoglobin             Suggested A1C NGSP%              Diagnosis  <5.7                   Non Diabetic  5.7-6.4                Pre-Diabetic  >6.4                   Diabetic  <7.0                   Glycemic control for                       adults with diabetes.    10/13/2022 02:29 AM 12.0 (H) 4.8 - 5.6 % Final    Comment:    (NOTE)         Prediabetes: 5.7 - 6.4         Diabetes: >6.4         Glycemic control for adults with diabetes: <7.0     CBG: Recent Labs  Lab 02/01/24 1547 02/01/24 1941 02/01/24 2357 02/02/24 0322 02/02/24 0734  GLUCAP 159* 132* 143* 139* 140*    Review of Systems:   Endorses left sided abdominal pain and sacral pain. Denies chest pain, palpitations,  shortness of breath, headache, vision changes, nausea or vomiting.   Past Medical History:  He,  has a past medical history of Anxiety, CHF (congestive heart failure) (HCC), Chronic diastolic (congestive) heart failure (HCC), Depression, Diabetes mellitus without complication (HCC), Hyperlipemia, Hypertension, Morbid obesity (HCC), and Peripheral edema.   Surgical History:   Past Surgical History:  Procedure Laterality Date   AMPUTATION Left 09/07/2023   Procedure: AMPUTATION, ABOVE KNEE;  Surgeon: Marea Selinda RAMAN, MD;  Location: [facility] ORS;  Service: General;  Laterality: Left;   APPENDECTOMY     BELOW KNEE LEG AMPUTATION     Right leg   BIOPSY  10/14/2022   Procedure: BIOPSY;  Surgeon: Aundria, Ladell POUR, MD;  Location: [facility] ENDOSCOPY;  Service: Gastroenterology;;   COLONOSCOPY N/A 10/16/2022   Procedure: COLONOSCOPY;  Surgeon: Therisa Bi, MD;  Location: [facility]  ENDOSCOPY;  Service: Gastroenterology;  Laterality: N/A;   ESOPHAGOGASTRODUODENOSCOPY (EGD) WITH PROPOFOL  N/A 10/26/2022   Procedure: ESOPHAGOGASTRODUODENOSCOPY (EGD) WITH PROPOFOL ;  Surgeon: Maryruth Ole DASEN, MD;  Location: [facility] ENDOSCOPY;  Service: Endoscopy;  Laterality: N/A;   ESOPHAGOGASTRODUODENOSCOPY (EGD) WITH PROPOFOL  N/A 10/14/2022   Procedure: ESOPHAGOGASTRODUODENOSCOPY (EGD) WITH PROPOFOL ;  Surgeon: Toledo, Ladell POUR, MD;  Location: [facility] ENDOSCOPY;  Service: Gastroenterology;  Laterality: N/A;   INCISION AND DRAINAGE ABSCESS Left 02/27/2023   Procedure: INCISION AND DRAINAGE ABSCESS LEFT FINGER AND ARM;  Surgeon: Robbin Redell Ned, MD;  Location: [facility] ORS;  Service: Orthopedics;  Laterality: Left;   TONSILLECTOMY       Social History:   reports that he has never smoked. He has never used smokeless tobacco. He reports that he does not drink alcohol and does not use drugs.   Family History:  His family history includes CAD in his father; COPD in his mother; Diabetes in his father; Kidney cancer in his brother.   Allergies Allergies  Allergen Reactions   Fluoxetine     Other reaction(s): Hallucination   Shellfish Allergy Hives   Sulfa Antibiotics Hives   Vancomycin      Other reaction(s): Red Man Syndrome   Metformin Nausea Only     Home Medications  Prior to Admission medications   Medication Sig Start Date End Date Taking? Authorizing Provider  amoxicillin -clavulanate (AUGMENTIN ) 875-125 MG tablet Take 1 tablet by mouth 2 (two) times daily for 7 days. 01/26/24 02/02/24 Yes Ray, Nilsa, MD  aspirin  EC 81 MG tablet Take 81 mg by mouth.  Take 1 tablet (81 mg total) by mouth once daily 04/22/23 04/22/24 Yes [provider]  atorvastatin  (LIPITOR) 40 MG tablet Take 40 mg by mouth daily.    Yes [provider]  baclofen  (LIORESAL ) 10 MG tablet Take 15 mg by mouth every 8 (eight) hours. 06/30/23  Yes [provider]  cetirizine (ZYRTEC) 10 MG tablet Take  10 mg by mouth daily as needed for allergies. 06/23/20  Yes [provider]  DULoxetine  (CYMBALTA ) 30 MG capsule Take 90 mg by mouth daily.   Yes [provider]  finasteride  (PROSCAR ) 5 MG tablet Take 5 mg by mouth daily. 08/17/23  Yes [provider]  furosemide  (LASIX ) 80 MG tablet Take 80 mg by mouth 2 (two) times daily. 12/08/22  Yes [provider]  gabapentin  (NEURONTIN ) 600 MG tablet Take 600 mg by mouth 3 (three) times daily. 06/30/23  Yes [provider]  insulin  degludec (TRESIBA) 200 UNIT/ML FlexTouch Pen Inject 20 Units into the skin at bedtime. 11/11/23  Yes [provider]  insulin  glargine, 1 Unit  Dial, (TOUJEO  SOLOSTAR) 300 UNIT/ML Solostar Pen Inject 20 Units into the skin at bedtime.   Yes [provider]  insulin  lispro (HUMALOG) 100 UNIT/ML KwikPen Inject 20 Units into the skin 3 (three) times daily. 11/11/23 11/10/24 Yes [provider]  loperamide (IMODIUM A-D) 2 MG tablet Take 2 mg by mouth as needed for diarrhea or loose stools.   Yes [provider]  melatonin 5 MG TABS Take 1 tablet (5 mg total) by mouth at bedtime. 11/16/22  Yes Awanda City, MD  metoprolol  succinate (TOPROL -XL) 50 MG 24 hr tablet Take 50 mg by mouth at bedtime. 01/05/24  Yes [provider]  MOUNJARO 2.5 MG/0.5ML Pen Inject 2.5 mg into the skin once a week. 11/11/23  Yes [provider]  MOVANTIK 25 MG TABS tablet Take 25 mg by mouth daily. 10/18/23  Yes [provider]  nitrofurantoin  (MACRODANTIN ) 50 MG capsule Take 50 mg by mouth every 6 (six) hours. 10/26/23  Yes [provider]  oxyCODONE  (ROXICODONE ) 15 MG immediate release tablet Take 15 mg by mouth every 6 (six) hours as needed.   Yes [provider]  oxyCODONE  (ROXICODONE ) 15 MG immediate release tablet Take 1 tablet (15 mg total) by mouth every 6 (six) hours as needed for pain. 10/09/23  Yes Dorothyann Drivers, MD  potassium chloride   (MICRO-K ) 10 MEQ CR capsule Take by mouth 2 (two) times daily. :1 Tablet(s) By Mouth Twice Daily   Yes [provider]  pregabalin  (LYRICA ) 150 MG capsule Take 150 mg by mouth 3 (three) times daily. 08/15/23  Yes [provider]  senna (SENOKOT) 8.6 MG TABS tablet Take 2 tablets by mouth daily.   Yes [provider]  senna-docusate (SENOKOT-S) 8.6-50 MG tablet Take 2 tablets by mouth 2 (two) times daily. 09/16/23  Yes [provider]  tamsulosin  (FLOMAX ) 0.4 MG CAPS capsule Take 0.8 mg by mouth daily.   Yes [provider]  acetaminophen  (TYLENOL ) 325 MG tablet Take 2 tablets (650 mg total) by mouth every 6 (six) hours as needed for mild pain (pain score 1-3) (or Fever >/= 101). 03/02/23   Barbarann Nest, MD  bisacodyl  (DULCOLAX) 10 MG suppository Place 10 mg rectally daily as needed for moderate constipation.    [provider]  buPROPion  (WELLBUTRIN  XL) 150 MG 24 hr tablet Take 1 tablet (150 mg total) by mouth daily. 11/06/22 10/06/23  Trudy Anthony HERO, MD  diclofenac Sodium (VOLTAREN) 1 % GEL Apply 2 g topically daily as needed (for pain).    [provider]  diphenhydrAMINE  (BENADRYL ) 25 mg capsule Take 1 capsule (25 mg total) by mouth every 6 (six) hours as needed for allergies or itching (30 minutes prior to starting Vancomycin ). Patient not taking: Reported on 10/06/2023 03/02/23   Barbarann Nest, MD  HUMULIN  R 100 UNIT/ML injection Inject 0.1 mLs (10 Units total) into the skin 3 (three) times daily with meals. 11/16/22   Awanda City, MD  mupirocin  ointment (BACTROBAN ) 2 % Apply to penis topically as needed.    [provider]  oxymetazoline  (AFRIN) 0.05 % nasal spray Place 1 spray into both nostrils 2 (two) times daily as needed for congestion.    [provider]  polyethylene glycol (MIRALAX  / GLYCOLAX ) 17 g packet Take 17 g by mouth daily as needed. 11/16/22   Awanda City, MD     Critical care provider statement:   Total  critical care time: 33 minutes   Performed by: Parris MD  Critical care time was exclusive of separately billable procedures and treating other patients.   Critical care was necessary to treat or prevent imminent or life-threatening deterioration.   Critical care was time spent personally by me on the following activities: development of treatment plan with patient and/or surrogate as well as nursing, discussions with consultants, evaluation of patient's response to treatment, examination of patient, obtaining history from patient or surrogate, ordering and performing treatments and interventions, ordering and review of laboratory studies, ordering and review of radiographic studies, pulse oximetry and re-evaluation of patient's condition.    Pharell Rolfson, M.D.  Pulmonary & Critical Care Medicine

## 2024-02-02 NOTE — Care Management Important Message (Signed)
 Important Message  Patient Details  Name: Russell [name] MRN: [id number] Date of Birth: 1965-11-24   Important Message Given:  Yes - Medicare IM     Nikan Ellingson W, CMA 02/02/2024, 10:59 AM
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
## 2024-02-02 NOTE — Progress Notes (Signed)
 Osteomyelitis of Right finger wound Discussed case with Pharmacist reviewing previous ESBL/MRSA wound infection in foot and allergies as well as labs and medications this admission. - started Zosyn  and Linezolid  empirically - Infectious disease consultation placed - Orthopedics already following - updated patient on plan of care, all questions and concerns addressed   Jenita Ruth Rust-Chester, AGACNP-BC Acute Care Nurse Practitioner [facility]   [phone] / [phone] Please see Amion for details.

## 2024-02-02 NOTE — Progress Notes (Signed)
  Chaplain On-Call responded to a page from [employer], who reported that the patient's Nurse Nena requested the Chaplain to see the patient.  Chaplain arrived on the Unit and received social and medical updates from [employer]. Nena described that the patient and his wife had an escalating argument in the patient's room, and patient's wife also arguing with Staff at the Nurses' desk.  Chaplain met the patient, who has intense spiritual and emotional distress. He stated that he needs peace and calm for his blood pressure stress, and that his family is not providing that support for him. He stated also that a Psychiatrist is scheduled to see him today, and he hopes for relief from his anxiety and fluctuating emotions.  Chaplain provided prayer and spiritual and emotional support.  Chaplain Bebe Ardean EMERSON Hershal., [facility]

## 2024-02-02 NOTE — Anesthesia Postprocedure Evaluation (Signed)
 Anesthesia Post Note  Patient: Russell [name]  Procedure(s) Performed: EGD (ESOPHAGOGASTRODUODENOSCOPY) CONTROL OF HEMORRHAGE, GI TRACT, ENDOSCOPIC, BY CLIPPING OR OVERSEWING  Patient location during evaluation: ICU Anesthesia Type: General Level of consciousness: awake and alert Pain management: pain level controlled Vital Signs Assessment: post-procedure vital signs reviewed and stable Respiratory status: spontaneous breathing, nonlabored ventilation, respiratory function stable and patient connected to nasal cannula oxygen Cardiovascular status: blood pressure returned to baseline and stable Postop Assessment: no apparent nausea or vomiting Anesthetic complications: no   No notable events documented.   Last Vitals:  Vitals:   02/02/24 0645 02/02/24 0700  BP: (!) 117/55 (!) 121/55  Pulse: 68 67  Resp: (!) 9 10  Temp:    SpO2: 96% 97%    Last Pain:  Vitals:   02/02/24 0400  TempSrc: Axillary  PainSc:                  [place] [facility]

## 2024-02-02 NOTE — Plan of Care (Signed)
 Patient alert and oriented.on 2 liters of oxygen. Levophed  titrated off this am.  Tolerating diet. Adequate urine output throughout shift. Blood products given per orders. No active bleeding noted during my shift. Continue to assess.

## 2024-02-02 NOTE — Plan of Care (Signed)
  Problem: Fluid Volume: Goal: Ability to maintain a balanced intake and output will improve Outcome: Progressing   Problem: Health Behavior/Discharge Planning: Goal: Ability to identify and utilize available resources and services will improve Outcome: Progressing   Problem: Metabolic: Goal: Ability to maintain appropriate glucose levels will improve Outcome: Progressing   Problem: Nutritional: Goal: Maintenance of adequate nutrition will improve Outcome: Progressing   Problem: Tissue Perfusion: Goal: Adequacy of tissue perfusion will improve Outcome: Progressing

## 2024-02-02 NOTE — Consult Note (Signed)
 ORTHOPAEDIC CONSULTATION  REQUESTING PHYSICIAN: Parris Manna, MD  Chief Complaint:   Right hand finger wounds  History of Present Illness: Russell [name] is a 58 y.o. male with a history of diabetes, peripheral vascular disease status post right BKA, left AKA, left forearm amputation with chronic heart failure who is admitted to the ICU due to concern of a GI bleed and anemia.  The patient suffered an injury to the tip of the middle finger about a month ago.  It seems to be healing relatively routinely but recently the scab fell off and he began to have an oozing wound at the tip of the finger once again.  He also has more chronic wound at the tip of the index finger.  Due to the patient's previous amputations he is very resistant to the idea of having any part of his fingers amputated and wants to do everything possible to maintain as much finger length as he can.  Past Medical History:  Diagnosis Date   Anxiety    CHF (congestive heart failure) (HCC)    EF 30-35%   Chronic diastolic (congestive) heart failure (HCC)    Depression    Diabetes mellitus without complication (HCC)    Hyperlipemia    Hypertension    Morbid obesity (HCC)    Peripheral edema    Past Surgical History:  Procedure Laterality Date   AMPUTATION Left 09/07/2023   Procedure: AMPUTATION, ABOVE KNEE;  Surgeon: Marea Selinda RAMAN, MD;  Location: [facility] ORS;  Service: General;  Laterality: Left;   APPENDECTOMY     BELOW KNEE LEG AMPUTATION     Right leg   BIOPSY  10/14/2022   Procedure: BIOPSY;  Surgeon: Aundria, Ladell POUR, MD;  Location: [facility] ENDOSCOPY;  Service: Gastroenterology;;   COLONOSCOPY N/A 10/16/2022   Procedure: COLONOSCOPY;  Surgeon: Therisa Bi, MD;  Location: [facility] ENDOSCOPY;  Service: Gastroenterology;  Laterality: N/A;   ESOPHAGOGASTRODUODENOSCOPY N/A 02/01/2024   Procedure: EGD (ESOPHAGOGASTRODUODENOSCOPY);  Surgeon: Jinny Carmine, MD;  Location:  [facility] ENDOSCOPY;  Service: Endoscopy;  Laterality: N/A;   ESOPHAGOGASTRODUODENOSCOPY (EGD) WITH PROPOFOL  N/A 10/26/2022   Procedure: ESOPHAGOGASTRODUODENOSCOPY (EGD) WITH PROPOFOL ;  Surgeon: Maryruth Ole DASEN, MD;  Location: [facility] ENDOSCOPY;  Service: Endoscopy;  Laterality: N/A;   ESOPHAGOGASTRODUODENOSCOPY (EGD) WITH PROPOFOL  N/A 10/14/2022   Procedure: ESOPHAGOGASTRODUODENOSCOPY (EGD) WITH PROPOFOL ;  Surgeon: Toledo, Ladell POUR, MD;  Location: [facility] ENDOSCOPY;  Service: Gastroenterology;  Laterality: N/A;   HEMOSTASIS CLIP PLACEMENT  02/01/2024   Procedure: CONTROL OF HEMORRHAGE, GI TRACT, ENDOSCOPIC, BY CLIPPING OR OVERSEWING;  Surgeon: Jinny Carmine, MD;  Location: [facility] ENDOSCOPY;  Service: Endoscopy;;   INCISION AND DRAINAGE ABSCESS Left 02/27/2023   Procedure: INCISION AND DRAINAGE ABSCESS LEFT FINGER AND ARM;  Surgeon: Robbin Redell Ned, MD;  Location: [facility] ORS;  Service: Orthopedics;  Laterality: Left;   TONSILLECTOMY     Social History   Socioeconomic History   Marital status: Legally Separated    Spouse name: Not on file   Number of children: Not on file   Years of education: Not on file   Highest education level: Not on file  Occupational History   Not on file  Tobacco Use   Smoking status: Never   Smokeless tobacco: Never  Vaping Use   Vaping status: Never Used  Substance and Sexual Activity   Alcohol use: No   Drug use: No   Sexual activity: Not Currently  Other Topics Concern   Not on file  Social History Narrative   Living with  mother now   Wheelchair bound at baseline   Social Drivers of Health   Financial Resource Strain: Low Risk  (04/04/2023)   Received from [facility]   Overall Financial Resource Strain (CARDIA)    Difficulty of Paying Living Expenses: Not hard at all  Food Insecurity: Unknown (09/07/2023)   Hunger Vital Sign    Worried About Running Out of Food in the Last Year: Not on file    Ran Out of Food in the Last Year: Patient  declined  Transportation Needs: No Transportation Needs (09/07/2023)   PRAPARE - Administrator, Civil Service (Medical): No    Lack of Transportation (Non-Medical): No  Physical Activity: Not on file  Stress: Not on file  Social Connections: Not on file   Family History  Problem Relation Age of Onset   COPD Mother    Diabetes Father    CAD Father    Kidney cancer Brother    Allergies  Allergen Reactions   Fluoxetine     Other reaction(s): Hallucination   Shellfish Allergy Hives   Sulfa Antibiotics Hives   Vancomycin      Other reaction(s): Red Man Syndrome   Metformin Nausea Only   Prior to Admission medications   Medication Sig Start Date End Date Taking? Authorizing Provider  amoxicillin -clavulanate (AUGMENTIN ) 875-125 MG tablet Take 1 tablet by mouth 2 (two) times daily for 7 days. 01/26/24 02/02/24 Yes Ray, Nilsa, MD  aspirin  EC 81 MG tablet Take 81 mg by mouth.  Take 1 tablet (81 mg total) by mouth once daily 04/22/23 04/22/24 Yes [provider]  atorvastatin  (LIPITOR) 40 MG tablet Take 40 mg by mouth daily.    Yes [provider]  baclofen  (LIORESAL ) 10 MG tablet Take 15 mg by mouth every 8 (eight) hours. 06/30/23  Yes [provider]  cetirizine (ZYRTEC) 10 MG tablet Take 10 mg by mouth daily as needed for allergies. 06/23/20  Yes [provider]  DULoxetine  (CYMBALTA ) 30 MG capsule Take 90 mg by mouth daily.   Yes [provider]  finasteride  (PROSCAR ) 5 MG tablet Take 5 mg by mouth daily. 08/17/23  Yes [provider]  furosemide  (LASIX ) 80 MG tablet Take 80 mg by mouth 2 (two) times daily. 12/08/22  Yes [provider]  gabapentin  (NEURONTIN ) 600 MG tablet Take 600 mg by mouth 3 (three) times daily. 06/30/23  Yes [provider]  insulin  degludec (TRESIBA) 200 UNIT/ML FlexTouch Pen Inject 20 Units into the skin at bedtime. 11/11/23  Yes [provider]  insulin  glargine, 1 Unit Dial, (TOUJEO   SOLOSTAR) 300 UNIT/ML Solostar Pen Inject 20 Units into the skin at bedtime.   Yes [provider]  insulin  lispro (HUMALOG) 100 UNIT/ML KwikPen Inject 20 Units into the skin 3 (three) times daily. 11/11/23 11/10/24 Yes [provider]  loperamide (IMODIUM A-D) 2 MG tablet Take 2 mg by mouth as needed for diarrhea or loose stools.   Yes [provider]  melatonin 5 MG TABS Take 1 tablet (5 mg total) by mouth at bedtime. 11/16/22  Yes Awanda City, MD  metoprolol  succinate (TOPROL -XL) 50 MG 24 hr tablet Take 50 mg by mouth at bedtime. 01/05/24  Yes [provider]  MOUNJARO 2.5 MG/0.5ML Pen Inject 2.5 mg into the skin once a week. 11/11/23  Yes [provider]  MOVANTIK 25 MG TABS tablet Take 25 mg by mouth daily. 10/18/23  Yes [provider]  nitrofurantoin  (MACRODANTIN ) 50  MG capsule Take 50 mg by mouth every 6 (six) hours. 10/26/23  Yes [provider]  oxyCODONE  (ROXICODONE ) 15 MG immediate release tablet Take 15 mg by mouth every 6 (six) hours as needed.   Yes [provider]  oxyCODONE  (ROXICODONE ) 15 MG immediate release tablet Take 1 tablet (15 mg total) by mouth every 6 (six) hours as needed for pain. 10/09/23  Yes Dorothyann Drivers, MD  potassium chloride  (MICRO-K ) 10 MEQ CR capsule Take by mouth 2 (two) times daily. :1 Tablet(s) By Mouth Twice Daily   Yes [provider]  pregabalin  (LYRICA ) 150 MG capsule Take 150 mg by mouth 3 (three) times daily. 08/15/23  Yes [provider]  senna (SENOKOT) 8.6 MG TABS tablet Take 2 tablets by mouth daily.   Yes [provider]  senna-docusate (SENOKOT-S) 8.6-50 MG tablet Take 2 tablets by mouth 2 (two) times daily. 09/16/23  Yes [provider]  tamsulosin  (FLOMAX ) 0.4 MG CAPS capsule Take 0.8 mg by mouth daily.   Yes [provider]  acetaminophen  (TYLENOL ) 325 MG tablet Take 2 tablets (650 mg total) by mouth every 6 (six) hours as needed for mild  pain (pain score 1-3) (or Fever >/= 101). 03/02/23   Barbarann Nest, MD  bisacodyl  (DULCOLAX) 10 MG suppository Place 10 mg rectally daily as needed for moderate constipation.    [provider]  buPROPion  (WELLBUTRIN  XL) 150 MG 24 hr tablet Take 1 tablet (150 mg total) by mouth daily. 11/06/22 10/06/23  Trudy Anthony HERO, MD  diclofenac Sodium (VOLTAREN) 1 % GEL Apply 2 g topically daily as needed (for pain).    [provider]  diphenhydrAMINE  (BENADRYL ) 25 mg capsule Take 1 capsule (25 mg total) by mouth every 6 (six) hours as needed for allergies or itching (30 minutes prior to starting Vancomycin ). Patient not taking: Reported on 10/06/2023 03/02/23   Barbarann Nest, MD  HUMULIN  R 100 UNIT/ML injection Inject 0.1 mLs (10 Units total) into the skin 3 (three) times daily with meals. 11/16/22   Awanda City, MD  mupirocin  ointment (BACTROBAN ) 2 % Apply to penis topically as needed.    [provider]  oxymetazoline  (AFRIN) 0.05 % nasal spray Place 1 spray into both nostrils 2 (two) times daily as needed for congestion.    [provider]  polyethylene glycol (MIRALAX  / GLYCOLAX ) 17 g packet Take 17 g by mouth daily as needed. 11/16/22   Awanda City, MD   Recent Labs    01/30/24 2220 01/31/24 9682 01/31/24 0450 01/31/24 1302 02/01/24 0410 02/01/24 1348 02/01/24 1847 02/02/24 0157 02/02/24 0504 02/02/24 0813 02/02/24 1315  WBC 9.3  --  11.9*  --  11.6*  --   --   --  11.8*  --   --   HGB 5.9*  --  6.3*   < > 6.8*   < > 6.9* 7.4* 7.4* 7.4* 7.1*  HCT 18.7*  --  19.5*   < > 20.9*   < > 21.8* 23.2* 23.0* 22.9* 22.5*  PLT 223  --  216  --  219  --   --   --  208  --   --   K 4.7   < >  --   --  3.4*  --   --   --  3.3*  --   --   CL 101   < >  --   --  103  --   --   --  106  --   --   CO2 21*   < >  --   --  23  --   --   --  25  --   --   BUN 59*   < >  --   --  49*  --   --   --  20  --   --   CREATININE 1.02   < >  --   --  1.06  --   --   --  0.67  --   --    GLUCOSE 446*   < >  --   --  359*  --   --   --  125*  --   --   CALCIUM  8.1*   < >  --   --  8.1*  --   --   --  7.9*  --   --    < > = values in this interval not displayed.   ECHOCARDIOGRAM COMPLETE Result Date: 02/01/2024    ECHOCARDIOGRAM REPORT   Patient Name:   Russell [name] Date of Exam: 02/01/2024 Medical Rec #:  [id number]       Height:       71.0 in Accession #:    [id number]      Weight:       273.0 lb Date of Birth:  June 08, 1965        BSA:          2.407 m Patient Age:    58 years        BP:           129/59 mmHg Patient Gender: M               HR:           74 bpm. Exam Location:  [facility] Procedure: 2D Echo, Cardiac Doppler and Color Doppler (Both Spectral and Color            Flow Doppler were utilized during procedure). Indications:     Atrial Fibrillation I48.91  History:         Patient has no prior history of Echocardiogram examinations.                  CHF; Risk Factors:Diabetes.  Sonographer:     Christopher Furnace Referring Phys:  [id number] LANETA BLUNT Diagnosing Phys: Dwayne D Callwood MD  Sonographer Comments: Patient is obese and suboptimal apical window. IMPRESSIONS  1. Left ventricular ejection fraction, by estimation, is 55 to 60%. The left ventricle has normal function. The left ventricle has no regional wall motion abnormalities. Left ventricular diastolic parameters are consistent with Grade II diastolic dysfunction (pseudonormalization).  2. Right ventricular systolic function is normal. The right ventricular size is normal.  3. The mitral valve is normal in structure. No evidence of mitral valve regurgitation.  4. The aortic valve is normal in structure. Aortic valve regurgitation is not visualized. FINDINGS  Left Ventricle: Left ventricular ejection fraction, by estimation, is 55 to 60%. The left ventricle has normal function. The left ventricle has no regional wall motion abnormalities. Strain was performed and the global longitudinal strain is indeterminate. The left ventricular  internal cavity size was normal in size. There is borderline concentric left ventricular hypertrophy. Left ventricular diastolic parameters are consistent with Grade II diastolic dysfunction (pseudonormalization). Right Ventricle: The right ventricular size is normal. No increase in right ventricular wall thickness. Right ventricular systolic function is normal. Left Atrium:  Left atrial size was normal in size. Right Atrium: Right atrial size was normal in size. Pericardium: There is no evidence of pericardial effusion. Mitral Valve: The mitral valve is normal in structure. No evidence of mitral valve regurgitation. MV peak gradient, 5.0 mmHg. The mean mitral valve gradient is 2.0 mmHg. Tricuspid Valve: The tricuspid valve is normal in structure. Tricuspid valve regurgitation is trivial. Aortic Valve: The aortic valve is normal in structure. Aortic valve regurgitation is not visualized. Aortic valve mean gradient measures 10.0 mmHg. Aortic valve peak gradient measures 19.4 mmHg. Aortic valve area, by VTI measures 2.89 cm. Pulmonic Valve: The pulmonic valve was normal in structure. Pulmonic valve regurgitation is not visualized. Aorta: The ascending aorta was not well visualized. IAS/Shunts: No atrial level shunt detected by color flow Doppler. Additional Comments: 3D was performed not requiring image post processing on an independent workstation and was indeterminate.  LEFT VENTRICLE PLAX 2D LVIDd:         4.90 cm   Diastology LVIDs:         3.40 cm   LV e' medial:    10.70 cm/s LV PW:         1.10 cm   LV E/e' medial:  9.7 LV IVS:        1.20 cm   LV e' lateral:   7.72 cm/s LVOT diam:     2.20 cm   LV E/e' lateral: 13.5 LV SV:         109 LV SV Index:   45 LVOT Area:     3.80 cm LV IVRT:       79 msec  RIGHT VENTRICLE RV Basal diam:  4.20 cm     PULMONARY VEINS RV Mid diam:    1.80 cm     Diastolic Velocity: 31.80 cm/s RV S prime:     20.90 cm/s  S/D Velocity:       1.30 TAPSE (M-mode): 3.7 cm      Systolic  Velocity:  42.90 cm/s LEFT ATRIUM           Index        RIGHT ATRIUM           Index LA diam:      2.90 cm 1.20 cm/m   RA Area:     18.00 cm LA Vol (A4C): 79.1 ml 32.87 ml/m  RA Volume:   38.40 ml  15.96 ml/m  AORTIC VALVE AV Area (Vmax):    2.75 cm AV Area (Vmean):   2.74 cm AV Area (VTI):     2.89 cm AV Vmax:           220.00 cm/s AV Vmean:          143.000 cm/s AV VTI:            0.377 m AV Peak Grad:      19.4 mmHg AV Mean Grad:      10.0 mmHg LVOT Vmax:         159.00 cm/s LVOT Vmean:        103.000 cm/s LVOT VTI:          0.287 m LVOT/AV VTI ratio: 0.76  AORTA Ao Root diam: 3.00 cm MITRAL VALVE                TRICUSPID VALVE MV Area (PHT): 3.19 cm     TR Peak grad:   6.6 mmHg MV Area VTI:   2.91 cm     TR  Vmax:        128.00 cm/s MV Peak grad:  5.0 mmHg MV Mean grad:  2.0 mmHg     SHUNTS MV Vmax:       1.12 m/s     Systemic VTI:  0.29 m MV Vmean:      71.5 cm/s    Systemic Diam: 2.20 cm MV Decel Time: 238 msec MV E velocity: 104.00 cm/s MV A velocity: 85.90 cm/s MV E/A ratio:  1.21 Cara JONETTA Lovelace MD Electronically signed by Cara JONETTA Lovelace MD Signature Date/Time: 02/01/2024/1:24:12 PM    Final    CT Angio Abd/Pel w/ and/or w/o Result Date: 01/31/2024 CLINICAL DATA:  Persistent GI bleed with melena. EXAM: CT ANGIOGRAPHY ABDOMEN AND PELVIS WITH CONTRAST AND WITHOUT CONTRAST TECHNIQUE: Multidetector CT imaging of the abdomen and pelvis was performed using the standard protocol during bolus administration of intravenous contrast. Multiplanar reconstructed images and MIPs were obtained and reviewed to evaluate the vascular anatomy. RADIATION DOSE REDUCTION: This exam was performed according to the departmental dose-optimization program which includes automated exposure control, adjustment of the mA and/or kV according to patient size and/or use of iterative reconstruction technique. CONTRAST:  OMNIPAQUE  IOHEXOL  350 MG/ML SOLN COMPARISON:  CT abdomen pelvis 10/24/2022 FINDINGS: VASCULAR Aorta:  Normal abdominal aorta with normal caliber and no significant atherosclerosis. No dissection. Celiac: Normally patent. Normally patent branch vessels and branch anatomy. SMA: Normally patent. Renals: Normally patent bilateral single renal arteries. IMA: Normally patent IMA. Inflow: Normally patent bilateral iliac arteries. Proximal Outflow: Normally patent bilateral common femoral arteries and femoral bifurcations. Veins: Venous phase imaging demonstrates normal patency of venous structures in the abdomen and pelvis. No mesenteric venous thrombosis. The portal vein is normally patent. No gastric or esophageal varices identified. Review of the MIP images confirms the above findings. NON-VASCULAR Lower chest: No acute abnormality.  No hiatal hernia. Hepatobiliary: Underlying hepatic steatosis. No overt cirrhosis. No hepatic masses or biliary dilatation. The gallbladder is unremarkable. Pancreas: Diffusely atrophic pancreas without mass or inflammation. No pancreatic ductal dilatation. Spleen: Normal in size without focal abnormality. Adrenals/Urinary Tract: Normal adrenal glands. Kidneys demonstrate no hydronephrosis, focal lesion or calculi bilaterally. The urinary bladder is tremendously distended measuring up to roughly 20.7 x 14 x 15.1 cm for estimated volume of 2275 mL. Stomach/Bowel: Stomach and duodenum demonstrate no evidence of ulcer, mass or lesion. No evidence of active bleeding into the gastrointestinal tract on arterial or venous phases of imaging. Bowel shows no evidence of obstruction, ileus, inflammation or lesion. The appendix is surgically absent. No free intraperitoneal air. Lymphatic: No enlarged lymph nodes identified. Reproductive: Prostate is unremarkable. Other: Irregular subcutaneous soft tissue density in the subcutaneous fat of the upper right abdomen at roughly the level of the gallbladder may relate to a subcutaneous injection site or focal trauma. No ascites. Musculoskeletal: Degenerative  disc disease of the lumbar spine, most severely at L5-S1. IMPRESSION: 1. No evidence of active bleeding into the gastrointestinal tract on arterial or venous phases of imaging. 2. No evidence of peptic ulcer disease of the upper gastrointestinal tract by CT or upper tract varices. 3. Hepatic steatosis. 4. Tremendously distended urinary bladder with estimated volume of 2275 mL. 5. Irregular subcutaneous soft tissue density in the subcutaneous fat of the upper right abdomen at roughly the level of the gallbladder may relate to a subcutaneous injection site or focal trauma. Electronically Signed   By: Marcey Moan M.D.   On: 01/31/2024 18:47     Positive ROS:  All other systems have been reviewed and were otherwise negative with the exception of those mentioned in the HPI and as above.  Physical Exam: BP (!) 104/52   Pulse 67   Temp 98.1 F (36.7 C) (Oral)   Resp 12   Wt 123.8 kg   SpO2 100%   BMI 38.08 kg/m  General:  Alert, no acute distress Psychiatric:  Patient is competent for consent with normal mood and affect    Orthopedic Exam:  Right hand: He has a wound at the tip of the middle finger with a healthy appearing granular base.  There may be the small amount of bone exposed through the wound but this also has a granular base.  There is no significant erythema, drainage, signs of infection about this wound.  He is overall fairly well-appearing.  He also has a wounds of the index finger with some loss of the length of the tip of the finger as well as destruction of most of the nailbed.  This wound also is fairly well-appearing without any significant erythema or drainage.  He is able to make a full composite fist.  He lacks terminal extension of the DIP joints of the index and middle fingers.  His sensation to light touch is diminished.  The fingers are otherwise warm and well-perfused.  Imaging:  As above: Radiographs of the right hand demonstrates either previous partial amputation of  the index finger distal phalanx through the midportion of the bone or destruction from osteomyelitis.  The tip of the middle finger distal phalanx appears fairly normal although there may be some signs of early osteomyelitis as there does appear to be some cortical defects  Assessment/Plan: Right index finger tip and middle finger tip wounds - Recommend local wound care consultation; likely benefit from daily dressing changes - An Occupational Therapy consultation may be of benefit to work on range of motion of the hand as well as to train the patient on optimal hand use while trying to protect the finger wounds.  Additionally they may be able to offer some assistance in creating custom protective orthosis for the fingertips - Overall, the wounds are well-appearing without acute infection.  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  This can be treated with antibiotics as per primary - I discussed with the patient that he may eventually need a fingertip amputation if the wounds become infected or persistent without any significant healing.  He was resistant to this idea and would like to maintain as much finger length as possible.  At this time, I do not think a partial finger amputation is needed but the status of his wounds will need to be watched closely - The patient can follow-up as an outpatient with orthopedics but I would also recommend outpatient wound care consultation - Please feel free to reach back out if there are any changes to the wounds or concern for an acute or developing infection   Jackquline CANDIE Barrack, MD Orthopaedic Surgery [facility]

## 2024-02-02 NOTE — Consult Note (Addendum)
 WOC Nurse Consult Note: Reason for Consult: Consult requested for finger wounds.  Performed remotely after review of progress notes and photos in the EMR.  Right tip of  2 fingers with full thickness skin loss, middle is red and moist, other has yellow dry tissue.  Unknown etiology but noted as present on admission in the H&P.  Pt was previously treated with antibiotics for coverage.  He has a history of previous amputations related to vascular insufficiency.   Dressing procedure/placement/frequency: Topical treatment orders provided for bedside nurses to perform as follows to promote moist healing: Apply Xeroform  gauze and foam dressings to finger wounds Q day.  Please re-consult if further assistance is needed.  Thank-you,  Stephane Fought MSN, RN, CWOCN, CWCN-AP, CNS Contact Mon-Fri 0700-1500: [phone]

## 2024-02-02 NOTE — Progress Notes (Addendum)
 This patient underwent an EGD yesterday for recurrent melena.  The recurrent melena has considerably decreased.  The patient had an EGD with a duodenal ulcer seen with a pigmented vessel at the site that was clipped with 2 hemoclips.  There is no old blood or fresh blood seen throughout the exam indicating that the bleeding had stopped.  If the patient should have any further bleeding vascular surgery should attempt to embolize the area where the clips were placed.  Nothing further to do from GI point of view at this time.  The patient should be treated with a PPI and avoid any further NSAID use.  I will sign off.  Please call if any further GI concerns or questions.  We would like to thank you for the opportunity to participate in the care of Russell [name].

## 2024-02-02 NOTE — Progress Notes (Signed)
 [facility] CLINIC CARDIOLOGY PROGRESS NOTE       Patient ID: Russell [name] MRN: [id number] DOB/AGE: 02-May-1965 58 y.o.  Admit date: 01/30/2024 Referring Physician Dr. Laneta Blunt Primary Physician Olmedo, [name], MD  Primary Cardiologist [name] (previously seen by [name], [name]) Reason for Consultation AF RVR  HPI: Russell [name] is a 58 y.o. male  with a past medical history of paroxysmal atrial fibrillation not on AC, chronic diastolic heart failure, hx R BKA and L AKA, L forearm amputation, hx GIB 2/2 peptic ulcer who presented to the ED on 01/30/2024 for back pain, black tarry stool. Given transfusion overnight for significant anemia. Went into AF RVR this AM. Cardiology was consulted for further evaluation.   Interval history: -Patient seen and examined this AM, resting comfortably in hospital bed. -Feeling well today without complaints.  -Remaining in NSR. BP up this AM so weaning off levo.  -Hgb 7.4 on AM labs.  Review of systems complete and found to be negative unless listed above    Past Medical History:  Diagnosis Date   Anxiety    CHF (congestive heart failure) (HCC)    EF 30-35%   Chronic diastolic (congestive) heart failure (HCC)    Depression    Diabetes mellitus without complication (HCC)    Hyperlipemia    Hypertension    Morbid obesity (HCC)    Peripheral edema     Past Surgical History:  Procedure Laterality Date   AMPUTATION Left 09/07/2023   Procedure: AMPUTATION, ABOVE KNEE;  Surgeon: Marea Selinda RAMAN, MD;  Location: [facility] ORS;  Service: General;  Laterality: Left;   APPENDECTOMY     BELOW KNEE LEG AMPUTATION     Right leg   BIOPSY  10/14/2022   Procedure: BIOPSY;  Surgeon: Aundria, Ladell POUR, MD;  Location: [facility] ENDOSCOPY;  Service: Gastroenterology;;   COLONOSCOPY N/A 10/16/2022   Procedure: COLONOSCOPY;  Surgeon: Therisa Bi, MD;  Location: [facility] ENDOSCOPY;  Service: Gastroenterology;  Laterality: N/A;   ESOPHAGOGASTRODUODENOSCOPY (EGD)  WITH PROPOFOL  N/A 10/26/2022   Procedure: ESOPHAGOGASTRODUODENOSCOPY (EGD) WITH PROPOFOL ;  Surgeon: Maryruth Ole DASEN, MD;  Location: [facility] ENDOSCOPY;  Service: Endoscopy;  Laterality: N/A;   ESOPHAGOGASTRODUODENOSCOPY (EGD) WITH PROPOFOL  N/A 10/14/2022   Procedure: ESOPHAGOGASTRODUODENOSCOPY (EGD) WITH PROPOFOL ;  Surgeon: Toledo, Ladell POUR, MD;  Location: [facility] ENDOSCOPY;  Service: Gastroenterology;  Laterality: N/A;   INCISION AND DRAINAGE ABSCESS Left 02/27/2023   Procedure: INCISION AND DRAINAGE ABSCESS LEFT FINGER AND ARM;  Surgeon: Robbin Redell Ned, MD;  Location: [facility] ORS;  Service: Orthopedics;  Laterality: Left;   TONSILLECTOMY      Medications Prior to Admission  Medication Sig Dispense Refill Last Dose/Taking   amoxicillin -clavulanate (AUGMENTIN ) 875-125 MG tablet Take 1 tablet by mouth 2 (two) times daily for 7 days. 14 tablet 0 Unknown   aspirin  EC 81 MG tablet Take 81 mg by mouth.  Take 1 tablet (81 mg total) by mouth once daily   Unknown   atorvastatin  (LIPITOR) 40 MG tablet Take 40 mg by mouth daily.    Unknown   baclofen  (LIORESAL ) 10 MG tablet Take 15 mg by mouth every 8 (eight) hours.   Unknown   cetirizine (ZYRTEC) 10 MG tablet Take 10 mg by mouth daily as needed for allergies.   Unknown   DULoxetine  (CYMBALTA ) 30 MG capsule Take 90 mg by mouth daily.   Unknown   finasteride  (PROSCAR ) 5 MG tablet Take 5 mg by mouth daily.   Unknown   furosemide  (LASIX ) 80  MG tablet Take 80 mg by mouth 2 (two) times daily.   Unknown   gabapentin  (NEURONTIN ) 600 MG tablet Take 600 mg by mouth 3 (three) times daily.   Unknown   insulin  degludec (TRESIBA) 200 UNIT/ML FlexTouch Pen Inject 20 Units into the skin at bedtime.   Unknown   insulin  glargine, 1 Unit Dial, (TOUJEO  SOLOSTAR) 300 UNIT/ML Solostar Pen Inject 20 Units into the skin at bedtime.   Unknown   insulin  lispro (HUMALOG) 100 UNIT/ML KwikPen Inject 20 Units into the skin 3 (three) times daily.   Unknown   loperamide (IMODIUM A-D)  2 MG tablet Take 2 mg by mouth as needed for diarrhea or loose stools.   Unknown   melatonin 5 MG TABS Take 1 tablet (5 mg total) by mouth at bedtime.   Unknown   metoprolol  succinate (TOPROL -XL) 50 MG 24 hr tablet Take 50 mg by mouth at bedtime.   Unknown   MOUNJARO 2.5 MG/0.5ML Pen Inject 2.5 mg into the skin once a week.   Unknown   MOVANTIK 25 MG TABS tablet Take 25 mg by mouth daily.   Unknown   nitrofurantoin  (MACRODANTIN ) 50 MG capsule Take 50 mg by mouth every 6 (six) hours.   Unknown   oxyCODONE  (ROXICODONE ) 15 MG immediate release tablet Take 15 mg by mouth every 6 (six) hours as needed.   Unknown   oxyCODONE  (ROXICODONE ) 15 MG immediate release tablet Take 1 tablet (15 mg total) by mouth every 6 (six) hours as needed for pain. 30 tablet 0 Unknown   potassium chloride  (MICRO-K ) 10 MEQ CR capsule Take by mouth 2 (two) times daily. :1 Tablet(s) By Mouth Twice Daily   Unknown   pregabalin  (LYRICA ) 150 MG capsule Take 150 mg by mouth 3 (three) times daily.   Unknown   senna (SENOKOT) 8.6 MG TABS tablet Take 2 tablets by mouth daily.   Unknown   senna-docusate (SENOKOT-S) 8.6-50 MG tablet Take 2 tablets by mouth 2 (two) times daily.   Unknown   tamsulosin  (FLOMAX ) 0.4 MG CAPS capsule Take 0.8 mg by mouth daily.   Unknown   acetaminophen  (TYLENOL ) 325 MG tablet Take 2 tablets (650 mg total) by mouth every 6 (six) hours as needed for mild pain (pain score 1-3) (or Fever >/= 101).   Unknown   bisacodyl  (DULCOLAX) 10 MG suppository Place 10 mg rectally daily as needed for moderate constipation.   Unknown   buPROPion  (WELLBUTRIN  XL) 150 MG 24 hr tablet Take 1 tablet (150 mg total) by mouth daily. 30 tablet 0    diclofenac Sodium (VOLTAREN) 1 % GEL Apply 2 g topically daily as needed (for pain).   Unknown   diphenhydrAMINE  (BENADRYL ) 25 mg capsule Take 1 capsule (25 mg total) by mouth every 6 (six) hours as needed for allergies or itching (30 minutes prior to starting Vancomycin ). (Patient not  taking: Reported on 10/06/2023)   Unknown   HUMULIN  R 100 UNIT/ML injection Inject 0.1 mLs (10 Units total) into the skin 3 (three) times daily with meals.   Unknown   mupirocin  ointment (BACTROBAN ) 2 % Apply to penis topically as needed.   Unknown   oxymetazoline  (AFRIN) 0.05 % nasal spray Place 1 spray into both nostrils 2 (two) times daily as needed for congestion.   Unknown   polyethylene glycol (MIRALAX  / GLYCOLAX ) 17 g packet Take 17 g by mouth daily as needed.   Unknown   Social History   Socioeconomic History   Marital status:  Legally Separated    Spouse name: Not on file   Number of children: Not on file   Years of education: Not on file   Highest education level: Not on file  Occupational History   Not on file  Tobacco Use   Smoking status: Never   Smokeless tobacco: Never  Vaping Use   Vaping status: Never Used  Substance and Sexual Activity   Alcohol use: No   Drug use: No   Sexual activity: Not Currently  Other Topics Concern   Not on file  Social History Narrative   Living with mother now   Wheelchair bound at baseline   Social Drivers of Health   Financial Resource Strain: Low Risk  (04/04/2023)   Received from [facility]   Overall Financial Resource Strain (CARDIA)    Difficulty of Paying Living Expenses: Not hard at all  Food Insecurity: Unknown (09/07/2023)   Hunger Vital Sign    Worried About Running Out of Food in the Last Year: Not on file    Ran Out of Food in the Last Year: Patient declined  Transportation Needs: No Transportation Needs (09/07/2023)   PRAPARE - Administrator, Civil Service (Medical): No    Lack of Transportation (Non-Medical): No  Physical Activity: Not on file  Stress: Not on file  Social Connections: Not on file  Intimate Partner Violence: Not At Risk (09/07/2023)   Humiliation, Afraid, Rape, and Kick questionnaire    Fear of Current or Ex-Partner: No    Emotionally Abused: No    Physically Abused: No     Sexually Abused: No    Family History  Problem Relation Age of Onset   COPD Mother    Diabetes Father    CAD Father    Kidney cancer Brother      Vitals:   02/02/24 0615 02/02/24 0630 02/02/24 0645 02/02/24 0700  BP: (!) 120/52 (!) 115/53 (!) 117/55 (!) 121/55  Pulse: 66 67 68 67  Resp: (!) 9 (!) 8 (!) 9 10  Temp:      TempSrc:      SpO2: 97% 98% 96% 97%  Weight:        PHYSICAL EXAM General: Chronically ill appearing male, well nourished, in no acute distress. HEENT: Normocephalic and atraumatic. Neck: No JVD.  Lungs: Normal respiratory effort on 2L [place]. Clear bilaterally to auscultation. No wheezes, crackles, rhonchi.  Heart: HRRR. Normal S1 and S2 without gallops or murmurs.  Abdomen: Non-distended appearing.  Msk: Normal strength and tone for age. Extremities: S/p L AKA and R BKA. Neuro: Alert and oriented X 3. Somnolent. Psych: Answers questions appropriately.   Labs: Basic Metabolic Panel: Recent Labs    02/01/24 0410 02/02/24 0504  NA 136 138  K 3.4* 3.3*  CL 103 106  CO2 23 25  GLUCOSE 359* 125*  BUN 49* 20  CREATININE 1.06 0.67  CALCIUM  8.1* 7.9*  MG 2.1 2.1  PHOS 3.9 3.1   Liver Function Tests: No results for input(s): AST, ALT, ALKPHOS, BILITOT, PROT, ALBUMIN  in the last 72 hours. No results for input(s): LIPASE, AMYLASE in the last 72 hours. CBC: Recent Labs    01/30/24 0808 01/30/24 1512 02/01/24 0410 02/01/24 1348 02/02/24 0157 02/02/24 0504  WBC 8.0   < > 11.6*  --   --  11.8*  NEUTROABS 5.1  --   --   --   --   --   HGB 6.0*   < >  6.8*   < > 7.4* 7.4*  HCT 19.2*   < > 20.9*   < > 23.2* 23.0*  MCV 67.8*   < > 77.7*  --   --  82.1  PLT 209   < > 219  --   --  208   < > = values in this interval not displayed.   Cardiac Enzymes: No results for input(s): CKTOTAL, CKMB, CKMBINDEX, TROPONINIHS in the last 72 hours. BNP: No results for input(s): BNP in the last 72 hours. D-Dimer: No results for input(s):  DDIMER in the last 72 hours. Hemoglobin A1C: No results for input(s): HGBA1C in the last 72 hours. Fasting Lipid Panel: No results for input(s): CHOL, HDL, LDLCALC, TRIG, CHOLHDL, LDLDIRECT in the last 72 hours. Thyroid  Function Tests: No results for input(s): TSH, T4TOTAL, T3FREE, THYROIDAB in the last 72 hours.  Invalid input(s): FREET3 Anemia Panel: Recent Labs    01/30/24 0808  FERRITIN 26  TIBC 280  IRON  26*  RETICCTPCT 2.3     Radiology: ECHOCARDIOGRAM COMPLETE Result Date: 02/01/2024    ECHOCARDIOGRAM REPORT   Patient Name:   Russell [name] Date of Exam: 02/01/2024 Medical Rec #:  [id number]       Height:       71.0 in Accession #:    [id number]      Weight:       273.0 lb Date of Birth:  [birth year]        BSA:          2.407 m Patient Age:    58 years        BP:           129/59 mmHg Patient Gender: M               HR:           74 bpm. Exam Location:  [facility] Procedure: 2D Echo, Cardiac Doppler and Color Doppler (Both Spectral and Color            Flow Doppler were utilized during procedure). Indications:     Atrial Fibrillation I48.91  History:         Patient has no prior history of Echocardiogram examinations.                  CHF; Risk Factors:Diabetes.  Sonographer:     Christopher Furnace Referring Phys:  [id number] LANETA BLUNT Diagnosing Phys: Dwayne D Callwood MD  Sonographer Comments: Patient is obese and suboptimal apical window. IMPRESSIONS  1. Left ventricular ejection fraction, by estimation, is 55 to 60%. The left ventricle has normal function. The left ventricle has no regional wall motion abnormalities. Left ventricular diastolic parameters are consistent with Grade II diastolic dysfunction (pseudonormalization).  2. Right ventricular systolic function is normal. The right ventricular size is normal.  3. The mitral valve is normal in structure. No evidence of mitral valve regurgitation.  4. The aortic valve is normal in structure. Aortic valve regurgitation  is not visualized. FINDINGS  Left Ventricle: Left ventricular ejection fraction, by estimation, is 55 to 60%. The left ventricle has normal function. The left ventricle has no regional wall motion abnormalities. Strain was performed and the global longitudinal strain is indeterminate. The left ventricular internal cavity size was normal in size. There is borderline concentric left ventricular hypertrophy. Left ventricular diastolic parameters are consistent with Grade II diastolic dysfunction (pseudonormalization). Right Ventricle: The right ventricular size is normal. No increase in right ventricular wall thickness. Right  ventricular systolic function is normal. Left Atrium: Left atrial size was normal in size. Right Atrium: Right atrial size was normal in size. Pericardium: There is no evidence of pericardial effusion. Mitral Valve: The mitral valve is normal in structure. No evidence of mitral valve regurgitation. MV peak gradient, 5.0 mmHg. The mean mitral valve gradient is 2.0 mmHg. Tricuspid Valve: The tricuspid valve is normal in structure. Tricuspid valve regurgitation is trivial. Aortic Valve: The aortic valve is normal in structure. Aortic valve regurgitation is not visualized. Aortic valve mean gradient measures 10.0 mmHg. Aortic valve peak gradient measures 19.4 mmHg. Aortic valve area, by VTI measures 2.89 cm. Pulmonic Valve: The pulmonic valve was normal in structure. Pulmonic valve regurgitation is not visualized. Aorta: The ascending aorta was not well visualized. IAS/Shunts: No atrial level shunt detected by color flow Doppler. Additional Comments: 3D was performed not requiring image post processing on an independent workstation and was indeterminate.  LEFT VENTRICLE PLAX 2D LVIDd:         4.90 cm   Diastology LVIDs:         3.40 cm   LV e' medial:    10.70 cm/s LV PW:         1.10 cm   LV E/e' medial:  9.7 LV IVS:        1.20 cm   LV e' lateral:   7.72 cm/s LVOT diam:     2.20 cm   LV E/e'  lateral: 13.5 LV SV:         109 LV SV Index:   45 LVOT Area:     3.80 cm LV IVRT:       79 msec  RIGHT VENTRICLE RV Basal diam:  4.20 cm     PULMONARY VEINS RV Mid diam:    1.80 cm     Diastolic Velocity: 31.80 cm/s RV S prime:     20.90 cm/s  S/D Velocity:       1.30 TAPSE (M-mode): 3.7 cm      Systolic Velocity:  42.90 cm/s LEFT ATRIUM           Index        RIGHT ATRIUM           Index LA diam:      2.90 cm 1.20 cm/m   RA Area:     18.00 cm LA Vol (A4C): 79.1 ml 32.87 ml/m  RA Volume:   38.40 ml  15.96 ml/m  AORTIC VALVE AV Area (Vmax):    2.75 cm AV Area (Vmean):   2.74 cm AV Area (VTI):     2.89 cm AV Vmax:           220.00 cm/s AV Vmean:          143.000 cm/s AV VTI:            0.377 m AV Peak Grad:      19.4 mmHg AV Mean Grad:      10.0 mmHg LVOT Vmax:         159.00 cm/s LVOT Vmean:        103.000 cm/s LVOT VTI:          0.287 m LVOT/AV VTI ratio: 0.76  AORTA Ao Root diam: 3.00 cm MITRAL VALVE                TRICUSPID VALVE MV Area (PHT): 3.19 cm     TR Peak grad:   6.6 mmHg MV Area VTI:  2.91 cm     TR Vmax:        128.00 cm/s MV Peak grad:  5.0 mmHg MV Mean grad:  2.0 mmHg     SHUNTS MV Vmax:       1.12 m/s     Systemic VTI:  0.29 m MV Vmean:      71.5 cm/s    Systemic Diam: 2.20 cm MV Decel Time: 238 msec MV E velocity: 104.00 cm/s MV A velocity: 85.90 cm/s MV E/A ratio:  1.21 Cara JONETTA Lovelace MD Electronically signed by Cara JONETTA Lovelace MD Signature Date/Time: 02/01/2024/1:24:12 PM    Final    CT Angio Abd/Pel w/ and/or w/o Result Date: 01/31/2024 CLINICAL DATA:  Persistent GI bleed with melena. EXAM: CT ANGIOGRAPHY ABDOMEN AND PELVIS WITH CONTRAST AND WITHOUT CONTRAST TECHNIQUE: Multidetector CT imaging of the abdomen and pelvis was performed using the standard protocol during bolus administration of intravenous contrast. Multiplanar reconstructed images and MIPs were obtained and reviewed to evaluate the vascular anatomy. RADIATION DOSE REDUCTION: This exam was performed according to  the departmental dose-optimization program which includes automated exposure control, adjustment of the mA and/or kV according to patient size and/or use of iterative reconstruction technique. CONTRAST:  OMNIPAQUE  IOHEXOL  350 MG/ML SOLN COMPARISON:  CT abdomen pelvis 10/24/2022 FINDINGS: VASCULAR Aorta: Normal abdominal aorta with normal caliber and no significant atherosclerosis. No dissection. Celiac: Normally patent. Normally patent branch vessels and branch anatomy. SMA: Normally patent. Renals: Normally patent bilateral single renal arteries. IMA: Normally patent IMA. Inflow: Normally patent bilateral iliac arteries. Proximal Outflow: Normally patent bilateral common femoral arteries and femoral bifurcations. Veins: Venous phase imaging demonstrates normal patency of venous structures in the abdomen and pelvis. No mesenteric venous thrombosis. The portal vein is normally patent. No gastric or esophageal varices identified. Review of the MIP images confirms the above findings. NON-VASCULAR Lower chest: No acute abnormality.  No hiatal hernia. Hepatobiliary: Underlying hepatic steatosis. No overt cirrhosis. No hepatic masses or biliary dilatation. The gallbladder is unremarkable. Pancreas: Diffusely atrophic pancreas without mass or inflammation. No pancreatic ductal dilatation. Spleen: Normal in size without focal abnormality. Adrenals/Urinary Tract: Normal adrenal glands. Kidneys demonstrate no hydronephrosis, focal lesion or calculi bilaterally. The urinary bladder is tremendously distended measuring up to roughly 20.7 x 14 x 15.1 cm for estimated volume of 2275 mL. Stomach/Bowel: Stomach and duodenum demonstrate no evidence of ulcer, mass or lesion. No evidence of active bleeding into the gastrointestinal tract on arterial or venous phases of imaging. Bowel shows no evidence of obstruction, ileus, inflammation or lesion. The appendix is surgically absent. No free intraperitoneal air. Lymphatic: No  enlarged lymph nodes identified. Reproductive: Prostate is unremarkable. Other: Irregular subcutaneous soft tissue density in the subcutaneous fat of the upper right abdomen at roughly the level of the gallbladder may relate to a subcutaneous injection site or focal trauma. No ascites. Musculoskeletal: Degenerative disc disease of the lumbar spine, most severely at L5-S1. IMPRESSION: 1. No evidence of active bleeding into the gastrointestinal tract on arterial or venous phases of imaging. 2. No evidence of peptic ulcer disease of the upper gastrointestinal tract by CT or upper tract varices. 3. Hepatic steatosis. 4. Tremendously distended urinary bladder with estimated volume of 2275 mL. 5. Irregular subcutaneous soft tissue density in the subcutaneous fat of the upper right abdomen at roughly the level of the gallbladder may relate to a subcutaneous injection site or focal trauma. Electronically Signed   By: Marcey Moan M.D.   On: 01/31/2024  18:47   DG Chest Port 1 View Result Date: 01/31/2024 EXAM: 1 VIEW(S) XRAY OF THE CHEST 01/31/2024 03:05:27 PM COMPARISON: None available. CLINICAL HISTORY: Encounter for central line placement. FINDINGS: LINES, TUBES AND DEVICES: Interval placement of right internal jugular catheter with distal tip in expected position of SVC. LUNGS AND PLEURA: No focal pulmonary opacity. No pleural effusion. No pneumothorax. HEART AND MEDIASTINUM: The distal tip of the right internal jugular catheter is in the expected position of the SVC. No acute abnormality of the cardiac and mediastinal silhouettes. BONES AND SOFT TISSUES: No acute osseous abnormality. IMPRESSION: 1. Right internal jugular catheter with the distal tip in the expected position within the superior vena cava. Electronically signed by: Lynwood Seip MD 01/31/2024 03:26 PM EST RP Workstation: [id number]   DG Chest Port 1 View Result Date: 01/31/2024 EXAM: 1 VIEW(S) XRAY OF THE CHEST 01/31/2024 10:43:58 AM COMPARISON:  09/06/2023 CLINICAL HISTORY: SOB (shortness of breath); Atrial fibrillation with RVR (HCC) FINDINGS: LUNGS AND PLEURA: Low lung volumes. No focal pulmonary opacity. No pleural effusion. No pneumothorax. HEART AND MEDIASTINUM: No acute abnormality of the cardiac and mediastinal silhouettes. BONES AND SOFT TISSUES: No acute osseous abnormality. IMPRESSION: 1. Low lung volumes, No acute cardiopulmonary process. Electronically signed by: Katheleen Faes MD 01/31/2024 10:58 AM EST RP Workstation: [id number]    ECHO as above  TELEMETRY (personally reviewed): NSR rate 70s  EKG (personally reviewed): none for review this admission  Data reviewed by me 02/02/2024: last 24h vitals tele labs imaging I/O ED provider note, admission H&P, hospitalist progress note  Principal Problem:   GI bleed Active Problems:   Acute blood loss anemia   Upper GI bleeding   Melena   Hematochezia   DU (duodenal ulcer)   Blood in stool    ASSESSMENT AND PLAN:  Russell [name] is a 58 y.o. male  with a past medical history of paroxysmal atrial fibrillation not on AC, chronic diastolic heart failure, hx R BKA and L AKA, L forearm amputation, hx GIB 2/2 peptic ulcer who presented to the ED on 01/30/2024 for back pain, black tarry stool. Given transfusion overnight for significant anemia. Went into AF RVR this AM. Cardiology was consulted for further evaluation.   # Atrial fibrillation RVR # Paroxysmal atrial fibrillation # Suspected acute on chronic HFpEF # GI bleed, ABLA Patient presented with back pain and episode of black tarry stool, found to have Hgb down to 6.0 from 11.3 three days prior. Transfused overnight with not much response. 01/31/2024 converted to AF RVR. Echo this admission with EF 55-60%, no WMAs, grade II diastolic dysfunction.  -Tranition to PO amiodarone  loading with 400 mg BID for 7 days followed by 200 daily.  -Ultimately patient would benefit from anticoagulation once bleeding managed and Hgb  stabilized.  -Defer further lasix  dosing. -Continue aspirin  81 mg daily, atorvastatin  80 mg daily.  -GI following, appreciate recs.  -Further management per primary team, GI.   This patient's plan of care was discussed and created with Dr. Florencio and he is in agreement.  Signed: Danita Bloch, PA-C  02/02/2024, 7:49 AM [facility]

## 2024-02-03 DIAGNOSIS — K264 Chronic or unspecified duodenal ulcer with hemorrhage: Secondary | ICD-10-CM | POA: Diagnosis not present

## 2024-02-03 LAB — TYPE AND SCREEN
ABO/RH(D): O NEG
Antibody Screen: NEGATIVE
Unit division: 0
Unit division: 0
Unit division: 0
Unit division: 0
Unit division: 0
Unit division: 0
Unit division: 0
Unit division: 0
Unit division: 0
Unit division: 0

## 2024-02-03 LAB — BPAM RBC
Blood Product Expiration Date: 202601062359
Blood Product Expiration Date: 202512062359
Blood Product Expiration Date: 202512312359
Blood Product Expiration Date: 202512312359
Blood Product Expiration Date: 202512312359
Blood Product Expiration Date: 202512312359
Blood Product Expiration Date: 202601042359
Blood Product Expiration Date: 202601062359
Blood Product Expiration Date: 202601062359
Blood Product Unit Number: 202601042359
ISSUE DATE / TIME: 202512011136
ISSUE DATE / TIME: 202512011500
ISSUE DATE / TIME: 202512020253
ISSUE DATE / TIME: 202512020542
ISSUE DATE / TIME: 202512021352
ISSUE DATE / TIME: 202512030831
ISSUE DATE / TIME: 202512041411
PRODUCT CODE: 202601042359
PRODUCT CODE: 202601042359
Unit Type and Rh: 202512032034
Unit Type and Rh: 202601062359
Unit Type and Rh: 5100
Unit Type and Rh: 5100
Unit Type and Rh: 5100
Unit Type and Rh: 5100
Unit Type and Rh: 5100
Unit Type and Rh: 5100
Unit Type and Rh: 5100
Unit Type and Rh: 5100
Unit Type and Rh: 5100
Unit Type and Rh: 5100
Unit Type and Rh: 9500

## 2024-02-03 LAB — BASIC METABOLIC PANEL WITH GFR
Anion gap: 5 (ref 5–15)
BUN: 14 mg/dL (ref 6–20)
CO2: 27 mmol/L (ref 22–32)
Calcium: 8 mg/dL — ABNORMAL LOW (ref 8.9–10.3)
Chloride: 104 mmol/L (ref 98–111)
Creatinine, Ser: 0.6 mg/dL — ABNORMAL LOW (ref 0.61–1.24)
GFR, Estimated: >60 mL/min (ref >60)
Glucose, Bld: 145 mg/dL — ABNORMAL HIGH (ref 70–99)
Potassium: 4 mmol/L (ref 3.5–5.1)
Sodium: 135 mmol/L (ref 135–145)

## 2024-02-03 LAB — CBC
HCT: 24.2 % — ABNORMAL LOW (ref 39.0–52.0)
Hemoglobin: 8 g/dL — ABNORMAL LOW (ref 13.0–17.0)
MCH: 26.5 pg (ref 26.0–34.0)
MCHC: 33.1 g/dL (ref 30.0–36.0)
MCV: 80.1 fL (ref 80.0–100.0)
Platelets: 178 K/uL (ref 150–400)
RBC: 3.02 MIL/uL — ABNORMAL LOW (ref 4.22–5.81)
RDW: 19.5 % — ABNORMAL HIGH (ref 11.5–15.5)
WBC: 5.7 K/uL (ref 4.0–10.5)
nRBC: 0 % (ref 0.0–0.2)

## 2024-02-03 LAB — GLUCOSE, CAPILLARY
Glucose-Capillary: 146 mg/dL — ABNORMAL HIGH (ref 70–99)
Glucose-Capillary: 163 mg/dL — ABNORMAL HIGH (ref 70–99)
Glucose-Capillary: 187 mg/dL — ABNORMAL HIGH (ref 70–99)
Glucose-Capillary: 200 mg/dL — ABNORMAL HIGH (ref 70–99)
Glucose-Capillary: 226 mg/dL — ABNORMAL HIGH (ref 70–99)
Glucose-Capillary: 234 mg/dL — ABNORMAL HIGH (ref 70–99)

## 2024-02-03 LAB — MAGNESIUM: Magnesium: 2.1 mg/dL (ref 1.7–2.4)

## 2024-02-03 LAB — PREPARE FRESH FROZEN PLASMA

## 2024-02-03 LAB — BPAM FFP
Blood Product Expiration Date: 202512062359
ISSUE DATE / TIME: 202512041659
Unit Type and Rh: 8400

## 2024-02-03 LAB — PREPARE RBC (CROSSMATCH)

## 2024-02-03 MED ORDER — OXYCODONE HCL 5 MG PO TABS
15.0000 mg | ORAL_TABLET | Freq: Four times a day (QID) | ORAL | Status: DC | PRN
  Administered 2024-02-03 – 2024-02-04 (×3): 15 mg via ORAL
  Filled 2024-02-03 (×3): qty 3

## 2024-02-03 MED ORDER — ENOXAPARIN SODIUM 60 MG/0.6ML IJ SOSY
60.0000 mg | PREFILLED_SYRINGE | INTRAMUSCULAR | Status: DC
  Administered 2024-02-03 – 2024-02-08 (×6): 60 mg via SUBCUTANEOUS
  Filled 2024-02-03 (×6): qty 0.6

## 2024-02-03 NOTE — Consult Note (Signed)
 Infectious Disease     Reason for Consult:finger osteomyelitis    Referring Physician: Dr Awanda Date of Admission:  01/30/2024   Principal Problem:   GI bleed Active Problems:   Acute blood loss anemia   Upper GI bleeding   Melena   Hematochezia   DU (duodenal ulcer)   Blood in stool   Duodenal ulcer   Blood loss anemia   Bipolar disorder (HCC)   HPI: Russell [name] is a 58 y.o. male With complicated medical issues who was admitted now December 1 with black tarry stools.  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  We are consulted for right hand finger wounds.  He had had an injury about a month ago and it scabbed over and began to ooze.  He has been seen by hand surgery.  There is a small amount of bone exposed through the wound he also has wound on his index finger.  Radiographs show either previous partial amputation of the index finger distal phalanx or destruction from osteomyelitis.  The tip of the middle finger distal phalanx was relatively normal but there may be some early signs of osteomyelitis.  Orthopedics recommended local care and he may need to undergo finger tip amputation which patient is not interested in Reports fingers a little better since admit  Past Medical History:  Diagnosis Date   Anxiety    CHF (congestive heart failure) (HCC)    EF 30-35%   Chronic diastolic (congestive) heart failure (HCC)    Depression    Diabetes mellitus without complication (HCC)    Hyperlipemia    Hypertension    Morbid obesity (HCC)    Peripheral edema    Past Surgical History:  Procedure Laterality Date   AMPUTATION Left 09/07/2023   Procedure: AMPUTATION, ABOVE KNEE;  Surgeon: Marea Selinda RAMAN, MD;  Location: [facility] ORS;  Service: General;  Laterality: Left;   APPENDECTOMY     BELOW KNEE LEG AMPUTATION     Right leg   BIOPSY  10/14/2022   Procedure: BIOPSY;  Surgeon: Aundria, Ladell POUR, MD;  Location: [facility] ENDOSCOPY;  Service:  Gastroenterology;;   COLONOSCOPY N/A 10/16/2022   Procedure: COLONOSCOPY;  Surgeon: Therisa Bi, MD;  Location: [facility] ENDOSCOPY;  Service: Gastroenterology;  Laterality: N/A;   ESOPHAGOGASTRODUODENOSCOPY N/A 02/01/2024   Procedure: EGD (ESOPHAGOGASTRODUODENOSCOPY);  Surgeon: Jinny Carmine, MD;  Location: [facility] ENDOSCOPY;  Service: Endoscopy;  Laterality: N/A;   ESOPHAGOGASTRODUODENOSCOPY (EGD) WITH PROPOFOL  N/A 10/26/2022   Procedure: ESOPHAGOGASTRODUODENOSCOPY (EGD) WITH PROPOFOL ;  Surgeon: Maryruth Ole DASEN, MD;  Location: [facility] ENDOSCOPY;  Service: Endoscopy;  Laterality: N/A;   ESOPHAGOGASTRODUODENOSCOPY (EGD) WITH PROPOFOL  N/A 10/14/2022   Procedure: ESOPHAGOGASTRODUODENOSCOPY (EGD) WITH PROPOFOL ;  Surgeon: Toledo, Ladell POUR, MD;  Location: [facility] ENDOSCOPY;  Service: Gastroenterology;  Laterality: N/A;   HEMOSTASIS CLIP PLACEMENT  02/01/2024   Procedure: CONTROL OF HEMORRHAGE, GI TRACT, ENDOSCOPIC, BY CLIPPING OR OVERSEWING;  Surgeon: Jinny Carmine, MD;  Location: [facility] ENDOSCOPY;  Service: Endoscopy;;   INCISION AND DRAINAGE ABSCESS Left 02/27/2023   Procedure: INCISION AND DRAINAGE ABSCESS LEFT FINGER AND ARM;  Surgeon: Robbin Redell Ned, MD;  Location: [facility] ORS;  Service: Orthopedics;  Laterality: Left;   TONSILLECTOMY     Social History   Tobacco Use   Smoking status: Never   Smokeless tobacco: Never  Vaping Use   Vaping status: Never Used  Substance Use Topics   Alcohol use: No   Drug use: No   Family  History  Problem Relation Age of Onset   COPD Mother    Diabetes Father    CAD Father    Kidney cancer Brother     Allergies:  Allergies  Allergen Reactions   Fluoxetine     Other reaction(s): Hallucination   Shellfish Allergy Hives   Sulfa Antibiotics Hives   Vancomycin      Other reaction(s): Red Man Syndrome   Metformin Nausea Only    Current antibiotics: Antibiotics Given (last 72 hours)     Date/Time Action Medication Dose Rate   02/02/24 2148 New Bag/Given    piperacillin -tazobactam (ZOSYN ) IVPB 3.375 g 3.375 g 12.5 mL/hr   02/02/24 2220 New Bag/Given   linezolid  (ZYVOX ) IVPB 600 mg 600 mg 300 mL/hr   02/03/24 0503 New Bag/Given   piperacillin -tazobactam (ZOSYN ) IVPB 3.375 g 3.375 g 12.5 mL/hr   02/03/24 9071 New Bag/Given   linezolid  (ZYVOX ) IVPB 600 mg 600 mg 300 mL/hr       MEDICATIONS:  sodium chloride    Intravenous Once   amiodarone   400 mg Oral BID   Followed by   NOREEN ON 02/09/2024] amiodarone   200 mg Oral Daily   atorvastatin   40 mg Oral Daily   baclofen   15 mg Oral Q8H   Chlorhexidine  Gluconate Cloth  6 each Topical Daily   DULoxetine   90 mg Oral Daily   finasteride   5 mg Oral Daily   insulin  aspart  0-20 Units Subcutaneous Q4H   insulin  glargine-yfgn  30 Units Subcutaneous QHS   melatonin  5 mg Oral QHS   pantoprazole  (PROTONIX ) IV  40 mg Intravenous Q12H   pregabalin   150 mg Oral TID   tamsulosin   0.8 mg Oral Daily    Review of Systems - 11 systems reviewed and negative per HPI   OBJECTIVE: Temp:  [97.7 F (36.5 C)-98.6 F (37 C)] 97.7 F (36.5 C) (12/05 1235) Pulse Rate:  [60-80] 63 (12/05 1235) Resp:  [10-25] 18 (12/05 0324) BP: (103-136)/(49-69) 109/50 (12/05 1235) SpO2:  [93 %-100 %] 94 % (12/05 1235) Physical Exam  Constitutional: He is oriented to person, place, and time. He appears well-developed and well-nourished. No distress. obese HENT: anicterc Mouth/Throat: Oropharynx is clear and moist. No oropharyngeal exudate.  Cardiovascular: Normal rate, regular rhythm and normal heart sounds.  Pulmonary/Chest: Effort normal and breath sounds normal. No respiratory distress. He has no wheezes.  Abdominal: Soft. Bowel sounds are normal. He exhibits no distension. There is no tenderness.  Neurological: He is alert and oriented to person, place, and time.  Skin: See photo -     12/4   L UE amputation, Bil LE amputation  Psychiatric: He has a normal mood and affect. His behavior is normal.      LABS: Results for orders placed or performed during the hospital encounter of 01/30/24 (from the past 48 hours)  Glucose, capillary     Status: Abnormal   Collection Time: 02/01/24  3:47 PM  Result Value Ref Range   Glucose-Capillary 159 (H) 70 - 99 mg/dL    Comment: Glucose reference range applies only to samples taken after fasting for at least 8 hours.  Hemoglobin and hematocrit, blood     Status: Abnormal   Collection Time: 02/01/24  6:47 PM  Result Value Ref Range   Hemoglobin 6.9 (L) 13.0 - 17.0 g/dL   HCT 78.1 (L) 60.9 - 47.9 %    Comment: Performed at [facility], [address], [place], [place] [address]  Prepare RBC (  crossmatch)     Status: None   Collection Time: 02/01/24  7:09 PM  Result Value Ref Range   Order Confirmation      ORDER PROCESSED BY BLOOD BANK Performed at [facility], [address] [place], [place] [address]   Glucose, capillary     Status: Abnormal   Collection Time: 02/01/24  7:41 PM  Result Value Ref Range   Glucose-Capillary 132 (H) 70 - 99 mg/dL    Comment: Glucose reference range applies only to samples taken after fasting for at least 8 hours.  Glucose, capillary     Status: Abnormal   Collection Time: 02/01/24 11:57 PM  Result Value Ref Range   Glucose-Capillary 143 (H) 70 - 99 mg/dL    Comment: Glucose reference range applies only to samples taken after fasting for at least 8 hours.  Hemoglobin and hematocrit, blood     Status: Abnormal   Collection Time: 02/02/24  1:57 AM  Result Value Ref Range   Hemoglobin 7.4 (L) 13.0 - 17.0 g/dL   HCT 76.7 (L) 60.9 - 47.9 %    Comment: Performed at [facility], [address] [place], [place] [address]  Glucose, capillary     Status: Abnormal   Collection Time: 02/02/24  3:22 AM  Result Value Ref Range   Glucose-Capillary 139 (H) 70 - 99 mg/dL    Comment: Glucose reference range applies only to samples taken after fasting for at least 8 hours.  Basic metabolic  panel with GFR     Status: Abnormal   Collection Time: 02/02/24  5:04 AM  Result Value Ref Range   Sodium 138 135 - 145 mmol/L   Potassium 3.3 (L) 3.5 - 5.1 mmol/L   Chloride 106 98 - 111 mmol/L   CO2 25 22 - 32 mmol/L   Glucose, Bld 125 (H) 70 - 99 mg/dL    Comment: Glucose reference range applies only to samples taken after fasting for at least 8 hours.   BUN 20 6 - 20 mg/dL   Creatinine, Ser 9.32 0.61 - 1.24 mg/dL   Calcium  7.9 (L) 8.9 - 10.3 mg/dL   GFR, Estimated >39 >39 mL/min    Comment: (NOTE) Calculated using the CKD-EPI Creatinine Equation (2021)    Anion gap 7 5 - 15    Comment: Performed at [facility], [address] [place], [place] [address]  CBC     Status: Abnormal   Collection Time: 02/02/24  5:04 AM  Result Value Ref Range   WBC 11.8 (H) 4.0 - 10.5 K/uL   RBC 2.80 (L) 4.22 - 5.81 MIL/uL   Hemoglobin 7.4 (L) 13.0 - 17.0 g/dL   HCT 76.9 (L) 60.9 - 47.9 %   MCV 82.1 80.0 - 100.0 fL   MCH 26.4 26.0 - 34.0 pg   MCHC 32.2 30.0 - 36.0 g/dL   RDW 78.9 (H) 88.4 - 84.4 %   Platelets 208 150 - 400 K/uL   nRBC 0.0 0.0 - 0.2 %    Comment: Performed at [facility], [address], [place], [place] [address]  Magnesium      Status: None   Collection Time: 02/02/24  5:04 AM  Result Value Ref Range   Magnesium  2.1 1.7 - 2.4 mg/dL    Comment: Performed at [facility], [address], [place], [place] [address]  Phosphorus     Status: None   Collection Time: 02/02/24  5:04 AM  Result Value Ref Range  Phosphorus 3.1 2.5 - 4.6 mg/dL    Comment: Performed at [facility], [address] [place], [place] [address]  Glucose, capillary     Status: Abnormal   Collection Time: 02/02/24  7:34 AM  Result Value Ref Range   Glucose-Capillary 140 (H) 70 - 99 mg/dL    Comment: Glucose reference range applies only to samples taken after fasting for at least 8 hours.  Hemoglobin and hematocrit, blood     Status: Abnormal   Collection Time:  02/02/24  8:13 AM  Result Value Ref Range   Hemoglobin 7.4 (L) 13.0 - 17.0 g/dL   HCT 77.0 (L) 60.9 - 47.9 %    Comment: Performed at [facility], [address] [place], [place] [address]  Glucose, capillary     Status: Abnormal   Collection Time: 02/02/24 11:45 AM  Result Value Ref Range   Glucose-Capillary 169 (H) 70 - 99 mg/dL    Comment: Glucose reference range applies only to samples taken after fasting for at least 8 hours.  Hemoglobin and hematocrit, blood     Status: Abnormal   Collection Time: 02/02/24  1:15 PM  Result Value Ref Range   Hemoglobin 7.1 (L) 13.0 - 17.0 g/dL   HCT 77.4 (L) 60.9 - 47.9 %    Comment: Performed at [facility], [address], [place], [place] [address]  Prepare RBC (crossmatch)     Status: None   Collection Time: 02/02/24  1:48 PM  Result Value Ref Range   Order Confirmation      DUPLICATE REQUEST BB SAMPLE OR UNITS ALREADY AVAILABLE Performed at [facility], [address], [place], [place] [address]   Prepare fresh frozen plasma     Status: None   Collection Time: 02/02/24  1:49 PM  Result Value Ref Range   Unit Number [id number]    Blood Component Type THW PLS APHR    Unit division A0    Status of Unit ISSUED,FINAL    Transfusion Status      OK TO TRANSFUSE Performed at [facility], [address] [place], [place] [address]   Glucose, capillary     Status: Abnormal   Collection Time: 02/02/24  3:45 PM  Result Value Ref Range   Glucose-Capillary 183 (H) 70 - 99 mg/dL    Comment: Glucose reference range applies only to samples taken after fasting for at least 8 hours.  MRSA Next Gen by PCR, Nasal     Status: None   Collection Time: 02/02/24  5:08 PM   Specimen: Nasal Mucosa; Nasal Swab  Result Value Ref Range   MRSA by PCR Next Gen NOT DETECTED NOT DETECTED    Comment: (NOTE) The GeneXpert MRSA Assay (FDA approved for NASAL specimens only), is one component of a comprehensive MRSA  colonization surveillance program. It is not intended to diagnose MRSA infection nor to guide or monitor treatment for MRSA infections. Test performance is not FDA approved in patients less than 78 years old. Performed at [facility], [address] [place], [place] [address]   Glucose, capillary     Status: Abnormal   Collection Time: 02/02/24  7:46 PM  Result Value Ref Range   Glucose-Capillary 232 (H) 70 - 99 mg/dL    Comment: Glucose reference range applies only to samples taken after fasting for at least 8 hours.  Hemoglobin and hematocrit, blood     Status: Abnormal   Collection Time: 02/02/24  9:44 PM  Result Value Ref Range   Hemoglobin 7.9 (L) 13.0 - 17.0 g/dL   HCT 74.7 (L) 60.9 - 47.9 %    Comment: Performed at [facility], [address] [place], [place] [address]  Glucose, capillary     Status: Abnormal   Collection Time: 02/02/24 11:35 PM  Result Value Ref Range   Glucose-Capillary 227 (H) 70 - 99 mg/dL    Comment: Glucose reference range applies only to samples taken after fasting for at least 8 hours.  Basic metabolic panel with GFR     Status: Abnormal   Collection Time: 02/03/24  4:25 AM  Result Value Ref Range   Sodium 135 135 - 145 mmol/L   Potassium 4.0 3.5 - 5.1 mmol/L   Chloride 104 98 - 111 mmol/L   CO2 27 22 - 32 mmol/L   Glucose, Bld 145 (H) 70 - 99 mg/dL    Comment: Glucose reference range applies only to samples taken after fasting for at least 8 hours.   BUN 14 6 - 20 mg/dL   Creatinine, Ser 9.39 (L) 0.61 - 1.24 mg/dL   Calcium  8.0 (L) 8.9 - 10.3 mg/dL   GFR, Estimated >39 >39 mL/min    Comment: (NOTE) Calculated using the CKD-EPI Creatinine Equation (2021)    Anion gap 5 5 - 15    Comment: Performed at [facility], [address] [place], [place] [address]  CBC     Status: Abnormal   Collection Time: 02/03/24  4:25 AM  Result Value Ref Range   WBC 5.7 4.0 - 10.5 K/uL   RBC 3.02 (L) 4.22 - 5.81 MIL/uL    Hemoglobin 8.0 (L) 13.0 - 17.0 g/dL   HCT 75.7 (L) 60.9 - 47.9 %   MCV 80.1 80.0 - 100.0 fL   MCH 26.5 26.0 - 34.0 pg   MCHC 33.1 30.0 - 36.0 g/dL   RDW 80.4 (H) 88.4 - 84.4 %   Platelets 178 150 - 400 K/uL   nRBC 0.0 0.0 - 0.2 %    Comment: Performed at [facility], [address] [place], [place] [address]  Magnesium      Status: None   Collection Time: 02/03/24  4:25 AM  Result Value Ref Range   Magnesium  2.1 1.7 - 2.4 mg/dL    Comment: Performed at [facility], [address] [place], [place] [address]  Glucose, capillary     Status: Abnormal   Collection Time: 02/03/24  4:37 AM  Result Value Ref Range   Glucose-Capillary 146 (H) 70 - 99 mg/dL    Comment: Glucose reference range applies only to samples taken after fasting for at least 8 hours.  Glucose, capillary     Status: Abnormal   Collection Time: 02/03/24  8:35 AM  Result Value Ref Range   Glucose-Capillary 163 (H) 70 - 99 mg/dL    Comment: Glucose reference range applies only to samples taken after fasting for at least 8 hours.  Glucose, capillary     Status: Abnormal   Collection Time: 02/03/24 12:32 PM  Result Value Ref Range   Glucose-Capillary 234 (H) 70 - 99 mg/dL    Comment: Glucose reference range applies only to samples taken after fasting for at least 8 hours.   No components found for: ESR, C REACTIVE PROTEIN MICRO: Recent Results (from the past 720 hours)  Blood culture (routine x 2)     Status: None   Collection Time: 01/27/24  5:34 PM  Specimen: BLOOD  Result Value Ref Range Status   Specimen Description BLOOD LEFT ANTECUBITAL  Final   Special Requests   Final    BOTTLES DRAWN AEROBIC AND ANAEROBIC Blood Culture adequate volume   Culture   Final    NO GROWTH 5 DAYS Performed at [facility], [address] [place], [place] [address]    Report Status 02/01/2024 FINAL  Final  Blood culture (routine x 2)     Status: None   Collection Time: 01/27/24  5:39 PM    Specimen: BLOOD  Result Value Ref Range Status   Specimen Description BLOOD BLOOD RIGHT HAND  Final   Special Requests   Final    BOTTLES DRAWN AEROBIC AND ANAEROBIC Blood Culture results may not be optimal due to an inadequate volume of blood received in culture bottles   Culture   Final    NO GROWTH 5 DAYS Performed at [facility], [address] [place], [place] [address]    Report Status 02/01/2024 FINAL  Final  Culture, blood (Routine X 2) w Reflex to ID Panel     Status: None (Preliminary result)   Collection Time: 01/31/24  7:21 PM   Specimen: BLOOD  Result Value Ref Range Status   Specimen Description BLOOD BLOOD RIGHT HAND  Final   Special Requests   Final    AEROBIC BOTTLE ONLY Blood Culture results may not be optimal due to an inadequate volume of blood received in culture bottles   Culture   Final    NO GROWTH < 24 HOURS Performed at [facility], [address], [place], [place] [address]    Report Status PENDING  Incomplete  Culture, blood (Routine X 2) w Reflex to ID Panel     Status: None (Preliminary result)   Collection Time: 02/01/24  2:39 AM   Specimen: BLOOD  Result Value Ref Range Status   Specimen Description BLOOD LEFT ANTECUBITAL  Final   Special Requests   Final    BOTTLES DRAWN AEROBIC AND ANAEROBIC Blood Culture adequate volume   Culture   Final    NO GROWTH < 12 HOURS Performed at [facility], [address] [place], [place] [address]    Report Status PENDING  Incomplete  MRSA Next Gen by PCR, Nasal     Status: None   Collection Time: 02/02/24  5:08 PM   Specimen: Nasal Mucosa; Nasal Swab  Result Value Ref Range Status   MRSA by PCR Next Gen NOT DETECTED NOT DETECTED Final    Comment: (NOTE) The GeneXpert MRSA Assay (FDA approved for NASAL specimens only), is one component of a comprehensive MRSA colonization surveillance program. It is not intended to diagnose MRSA infection nor to guide or monitor treatment  for MRSA infections. Test performance is not FDA approved in patients less than 66 years old. Performed at [facility], [address] [place], [place] [address]     IMAGING: [place] Hand Complete Right Result Date: 02/02/2024 EXAM: 3 or more VIEW(S) XRAY OF THE RIGHT HAND 02/02/2024 03:11:00 PM COMPARISON: None available. CLINICAL HISTORY: Wound dehiscence FINDINGS: BONES AND JOINTS: Osteolytic changes of the distal phalanx of the right index finger. SOFT TISSUES: Soft tissue swelling and dorsal and anterior ulceration associated with the right index finger. Soft tissue ulceration at the tip of the right middle finger. IMPRESSION: 1. Osteolytic changes of the distal phalanx of the right index finger with associated soft tissue swelling and dorsal and anterior ulceration.  Findings consistent with osteomyelitis. 2. Soft tissue ulceration at the tip of the right middle finger without underlying osteolytic change. Electronically signed by: Franky Stanford MD 02/02/2024 08:14 PM EST RP Workstation: [id number]   ECHOCARDIOGRAM COMPLETE Result Date: 02/01/2024    ECHOCARDIOGRAM REPORT   Patient Name:   Russell [name] Date of Exam: 02/01/2024 Medical Rec #:  [id number]       Height:       71.0 in Accession #:    [id number]      Weight:       273.0 lb Date of Birth:  1965-05-30        BSA:          2.407 m Patient Age:    58 years        BP:           129/59 mmHg Patient Gender: M               HR:           74 bpm. Exam Location:  [facility] Procedure: 2D Echo, Cardiac Doppler and Color Doppler (Both Spectral and Color            Flow Doppler were utilized during procedure). Indications:     Atrial Fibrillation I48.91  History:         Patient has no prior history of Echocardiogram examinations.                  CHF; Risk Factors:Diabetes.  Sonographer:     Christopher Furnace Referring Phys:  [id number] LANETA BLUNT Diagnosing Phys: Dwayne D Callwood MD  Sonographer Comments: Patient is obese and suboptimal apical  window. IMPRESSIONS  1. Left ventricular ejection fraction, by estimation, is 55 to 60%. The left ventricle has normal function. The left ventricle has no regional wall motion abnormalities. Left ventricular diastolic parameters are consistent with Grade II diastolic dysfunction (pseudonormalization).  2. Right ventricular systolic function is normal. The right ventricular size is normal.  3. The mitral valve is normal in structure. No evidence of mitral valve regurgitation.  4. The aortic valve is normal in structure. Aortic valve regurgitation is not visualized. FINDINGS  Left Ventricle: Left ventricular ejection fraction, by estimation, is 55 to 60%. The left ventricle has normal function. The left ventricle has no regional wall motion abnormalities. Strain was performed and the global longitudinal strain is indeterminate. The left ventricular internal cavity size was normal in size. There is borderline concentric left ventricular hypertrophy. Left ventricular diastolic parameters are consistent with Grade II diastolic dysfunction (pseudonormalization). Right Ventricle: The right ventricular size is normal. No increase in right ventricular wall thickness. Right ventricular systolic function is normal. Left Atrium: Left atrial size was normal in size. Right Atrium: Right atrial size was normal in size. Pericardium: There is no evidence of pericardial effusion. Mitral Valve: The mitral valve is normal in structure. No evidence of mitral valve regurgitation. MV peak gradient, 5.0 mmHg. The mean mitral valve gradient is 2.0 mmHg. Tricuspid Valve: The tricuspid valve is normal in structure. Tricuspid valve regurgitation is trivial. Aortic Valve: The aortic valve is normal in structure. Aortic valve regurgitation is not visualized. Aortic valve mean gradient measures 10.0 mmHg. Aortic valve peak gradient measures 19.4 mmHg. Aortic valve area, by VTI measures 2.89 cm. Pulmonic Valve: The pulmonic valve was normal in  structure. Pulmonic valve regurgitation is not visualized. Aorta: The ascending aorta was not well visualized. IAS/Shunts: No atrial level shunt detected by color flow  Doppler. Additional Comments: 3D was performed not requiring image post processing on an independent workstation and was indeterminate.  LEFT VENTRICLE PLAX 2D LVIDd:         4.90 cm   Diastology LVIDs:         3.40 cm   LV e' medial:    10.70 cm/s LV PW:         1.10 cm   LV E/e' medial:  9.7 LV IVS:        1.20 cm   LV e' lateral:   7.72 cm/s LVOT diam:     2.20 cm   LV E/e' lateral: 13.5 LV SV:         109 LV SV Index:   45 LVOT Area:     3.80 cm LV IVRT:       79 msec  RIGHT VENTRICLE RV Basal diam:  4.20 cm     PULMONARY VEINS RV Mid diam:    1.80 cm     Diastolic Velocity: 31.80 cm/s RV S prime:     20.90 cm/s  S/D Velocity:       1.30 TAPSE (M-mode): 3.7 cm      Systolic Velocity:  42.90 cm/s LEFT ATRIUM           Index        RIGHT ATRIUM           Index LA diam:      2.90 cm 1.20 cm/m   RA Area:     18.00 cm LA Vol (A4C): 79.1 ml 32.87 ml/m  RA Volume:   38.40 ml  15.96 ml/m  AORTIC VALVE AV Area (Vmax):    2.75 cm AV Area (Vmean):   2.74 cm AV Area (VTI):     2.89 cm AV Vmax:           220.00 cm/s AV Vmean:          143.000 cm/s AV VTI:            0.377 m AV Peak Grad:      19.4 mmHg AV Mean Grad:      10.0 mmHg LVOT Vmax:         159.00 cm/s LVOT Vmean:        103.000 cm/s LVOT VTI:          0.287 m LVOT/AV VTI ratio: 0.76  AORTA Ao Root diam: 3.00 cm MITRAL VALVE                TRICUSPID VALVE MV Area (PHT): 3.19 cm     TR Peak grad:   6.6 mmHg MV Area VTI:   2.91 cm     TR Vmax:        128.00 cm/s MV Peak grad:  5.0 mmHg MV Mean grad:  2.0 mmHg     SHUNTS MV Vmax:       1.12 m/s     Systemic VTI:  0.29 m MV Vmean:      71.5 cm/s    Systemic Diam: 2.20 cm MV Decel Time: 238 msec MV E velocity: 104.00 cm/s MV A velocity: 85.90 cm/s MV E/A ratio:  1.21 Dwayne JONETTA Lovelace MD Electronically signed by Cara JONETTA Lovelace MD Signature  Date/Time: 02/01/2024/1:24:12 PM    Final    CT Angio Abd/Pel w/ and/or w/o Result Date: 01/31/2024 CLINICAL DATA:  Persistent GI bleed with melena. EXAM: CT ANGIOGRAPHY ABDOMEN AND PELVIS WITH CONTRAST AND WITHOUT CONTRAST TECHNIQUE: Multidetector CT  imaging of the abdomen and pelvis was performed using the standard protocol during bolus administration of intravenous contrast. Multiplanar reconstructed images and MIPs were obtained and reviewed to evaluate the vascular anatomy. RADIATION DOSE REDUCTION: This exam was performed according to the departmental dose-optimization program which includes automated exposure control, adjustment of the mA and/or kV according to patient size and/or use of iterative reconstruction technique. CONTRAST:  OMNIPAQUE  IOHEXOL  350 MG/ML SOLN COMPARISON:  CT abdomen pelvis 10/24/2022 FINDINGS: VASCULAR Aorta: Normal abdominal aorta with normal caliber and no significant atherosclerosis. No dissection. Celiac: Normally patent. Normally patent branch vessels and branch anatomy. SMA: Normally patent. Renals: Normally patent bilateral single renal arteries. IMA: Normally patent IMA. Inflow: Normally patent bilateral iliac arteries. Proximal Outflow: Normally patent bilateral common femoral arteries and femoral bifurcations. Veins: Venous phase imaging demonstrates normal patency of venous structures in the abdomen and pelvis. No mesenteric venous thrombosis. The portal vein is normally patent. No gastric or esophageal varices identified. Review of the MIP images confirms the above findings. NON-VASCULAR Lower chest: No acute abnormality.  No hiatal hernia. Hepatobiliary: Underlying hepatic steatosis. No overt cirrhosis. No hepatic masses or biliary dilatation. The gallbladder is unremarkable. Pancreas: Diffusely atrophic pancreas without mass or inflammation. No pancreatic ductal dilatation. Spleen: Normal in size without focal abnormality. Adrenals/Urinary Tract: Normal adrenal  glands. Kidneys demonstrate no hydronephrosis, focal lesion or calculi bilaterally. The urinary bladder is tremendously distended measuring up to roughly 20.7 x 14 x 15.1 cm for estimated volume of 2275 mL. Stomach/Bowel: Stomach and duodenum demonstrate no evidence of ulcer, mass or lesion. No evidence of active bleeding into the gastrointestinal tract on arterial or venous phases of imaging. Bowel shows no evidence of obstruction, ileus, inflammation or lesion. The appendix is surgically absent. No free intraperitoneal air. Lymphatic: No enlarged lymph nodes identified. Reproductive: Prostate is unremarkable. Other: Irregular subcutaneous soft tissue density in the subcutaneous fat of the upper right abdomen at roughly the level of the gallbladder may relate to a subcutaneous injection site or focal trauma. No ascites. Musculoskeletal: Degenerative disc disease of the lumbar spine, most severely at L5-S1. IMPRESSION: 1. No evidence of active bleeding into the gastrointestinal tract on arterial or venous phases of imaging. 2. No evidence of peptic ulcer disease of the upper gastrointestinal tract by CT or upper tract varices. 3. Hepatic steatosis. 4. Tremendously distended urinary bladder with estimated volume of 2275 mL. 5. Irregular subcutaneous soft tissue density in the subcutaneous fat of the upper right abdomen at roughly the level of the gallbladder may relate to a subcutaneous injection site or focal trauma. Electronically Signed   By: Marcey Moan M.D.   On: 01/31/2024 18:47   DG Chest Port 1 View Result Date: 01/31/2024 EXAM: 1 VIEW(S) XRAY OF THE CHEST 01/31/2024 03:05:27 PM COMPARISON: None available. CLINICAL HISTORY: Encounter for central line placement. FINDINGS: LINES, TUBES AND DEVICES: Interval placement of right internal jugular catheter with distal tip in expected position of SVC. LUNGS AND PLEURA: No focal pulmonary opacity. No pleural effusion. No pneumothorax. HEART AND MEDIASTINUM: The  distal tip of the right internal jugular catheter is in the expected position of the SVC. No acute abnormality of the cardiac and mediastinal silhouettes. BONES AND SOFT TISSUES: No acute osseous abnormality. IMPRESSION: 1. Right internal jugular catheter with the distal tip in the expected position within the superior vena cava. Electronically signed by: Lynwood Seip MD 01/31/2024 03:26 PM EST RP Workstation: [id number]   DG Chest Port 1 View Result Date: 01/31/2024  EXAM: 1 VIEW(S) XRAY OF THE CHEST 01/31/2024 10:43:58 AM COMPARISON: 09/06/2023 CLINICAL HISTORY: SOB (shortness of breath); Atrial fibrillation with RVR (HCC) FINDINGS: LUNGS AND PLEURA: Low lung volumes. No focal pulmonary opacity. No pleural effusion. No pneumothorax. HEART AND MEDIASTINUM: No acute abnormality of the cardiac and mediastinal silhouettes. BONES AND SOFT TISSUES: No acute osseous abnormality. IMPRESSION: 1. Low lung volumes, No acute cardiopulmonary process. Electronically signed by: Katheleen Faes MD 01/31/2024 10:58 AM EST RP Workstation: [id number]    Assessment:   LANDERS PRAJAPATI is a 58 y.o. 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 Has seen ortho and may need amputation but pt hoping to preserve function.   Recommendations Cont zosyn  and linezolid   Culture done today of drainage from finger Could consider bone sample for culture to guide therapy.  Thank you very much for allowing me to participate in the care of this patient. Please call with questions.   Alm SQUIBB. Epifanio, MD

## 2024-02-03 NOTE — Progress Notes (Signed)
 PHARMACY CONSULT NOTE - ELECTROLYTES  Pharmacy Consult for Electrolyte Monitoring and Replacement   Recent Labs: Potassium (mmol/L)  Date Value  02/03/2024 4.0  06/13/2014 4.0   Magnesium  (mg/dL)  Date Value  87/94/7974 2.1  11/08/2011 1.7 (L)   Calcium  (mg/dL)  Date Value  87/94/7974 8.0 (L)   Calcium , Total (mg/dL)  Date Value  95/85/7983 8.6 (L)   Albumin  (g/dL)  Date Value  88/71/7974 3.6  06/05/2014 3.5   Phosphorus (mg/dL)  Date Value  87/95/7974 3.1   Sodium (mmol/L)  Date Value  02/03/2024 135  06/13/2014 136   Weight: 123.8 kg (273 lb) Estimated Creatinine Clearance: 133.2 mL/min (A) (by C-G formula based on SCr of 0.6 mg/dL (L)).  Assessment  Russell [name] is a 58 y.o. male presenting with hemorrhagic shock secondary to GI bleeding. PMH significant for paroxysmal atrial fibrillation not on AC, chronic diastolic heart failure, hx R BKA and L AKA, L forearm amputation, hx GIB 2/2 peptic ulcer . Pharmacy has been consulted to monitor and replace electrolytes.  Diet: NPO MIVF: N/A Pertinent medications: Amiodarone   Goal of Therapy: Electrolytes within normal limits  Plan:  No replacement needed today. Will dc CCM monitoring now that patient is in 2A. Will monitor peripherally and contact provider as needed.  Aolani Piggott Rodriguez-Guzman PharmD, BCPS 02/03/2024 7:37 AM

## 2024-02-03 NOTE — Progress Notes (Signed)
   02/03/24 1630  Spiritual Encounters  Type of Visit Initial  Care provided to: Patient  Referral source Chaplain team;Clinical staff  Reason for visit Routine spiritual support  OnCall Visit Yes  Interventions  Spiritual Care Interventions Made Established relationship of care and support;Compassionate presence;Reflective listening;Decision-making support/facilitation;Reconciliation with self/others;Narrative/life review;Encouragement;Explored ethical dilemma;Prayer   Chaplain met with patient per Spiritual Care request.  Provided a compassionate presence and reflective listening.  Reflected on and processed family relationships and family dynamics. Supported the facilitation of reconciliation with wife and family.  Discussed spiritual hope and practical hopes for health going forward.  Prayed with patient at conclusion of visit.

## 2024-02-03 NOTE — TOC Progression Note (Signed)
 Transition of Care [facility]) - Progression Note    Patient Details  Name: Russell [name] MRN: [id number] Date of Birth: [birth year]  Transition of Care [facility]) CM/SW Contact  Lauraine JAYSON Carpen, LCSW Phone Number: 02/03/2024, 3:10 PM  Clinical Narrative:  [facility] Care admissions coordinator will go talk to patient.     Barriers to Discharge: Continued Medical Work up               Expected Discharge Plan and Services                                               Social Drivers of Health (SDOH) Interventions SDOH Screenings   Food Insecurity: Food Insecurity Present (02/02/2024)  Housing: High Risk (02/02/2024)  Transportation Needs: Unmet Transportation Needs (02/02/2024)  Utilities: Not At Risk (02/02/2024)  Financial Resource Strain: Low Risk  (04/04/2023)   Received from [facility]  Tobacco Use: Low Risk  (02/02/2024)    Readmission Risk Interventions    03/03/2023   12:27 PM 10/25/2022    3:55 PM  Readmission Risk Prevention Plan  Transportation Screening Complete Complete  PCP or Specialist Appt within 3-5 Days  Complete  HRI or Home Care Consult  Complete  Social Work Consult for Recovery Care Planning/Counseling  Complete  Palliative Care Screening  Not Applicable  Medication Review [employer]) Complete Complete  PCP or Specialist appointment within 3-5 days of discharge Complete   HRI or Home Care Consult Complete   Palliative Care Screening Not Applicable   Skilled Nursing Facility Not Applicable

## 2024-02-03 NOTE — Progress Notes (Signed)
 [facility] CLINIC CARDIOLOGY PROGRESS NOTE       Patient ID: Russell [name] MRN: [id number] DOB/AGE: 1965-08-08 58 y.o.  Admit date: 01/30/2024 Referring Physician Dr. Laneta Blunt Primary Physician Olmedo, [name], MD  Primary Cardiologist [name] (previously seen by [name], [name]) Reason for Consultation AF RVR  HPI: [name] is a 58 y.o. male  with a past medical history of paroxysmal atrial fibrillation not on AC, chronic diastolic heart failure, hx R BKA and L AKA, L forearm amputation, hx GIB 2/2 peptic ulcer who presented to the ED on 01/30/2024 for back pain, black tarry stool. Given transfusion overnight for significant anemia. Went into AF RVR this AM. Cardiology was consulted for further evaluation.   Interval history: -Patient seen and examined this AM, resting comfortably in hospital bed. -Feeling well today without complaints of palpitations, SOB.  -Remaining in NSR. BP stable off pressors. -Hgb 8.0 on AM labs.  Review of systems complete and found to be negative unless listed above    Past Medical History:  Diagnosis Date   Anxiety    CHF (congestive heart failure) (HCC)    EF 30-35%   Chronic diastolic (congestive) heart failure (HCC)    Depression    Diabetes mellitus without complication (HCC)    Hyperlipemia    Hypertension    Morbid obesity (HCC)    Peripheral edema     Past Surgical History:  Procedure Laterality Date   AMPUTATION Left 09/07/2023   Procedure: AMPUTATION, ABOVE KNEE;  Surgeon: Marea Selinda RAMAN, MD;  Location: [facility] ORS;  Service: General;  Laterality: Left;   APPENDECTOMY     BELOW KNEE LEG AMPUTATION     Right leg   BIOPSY  10/14/2022   Procedure: BIOPSY;  Surgeon: Aundria, Ladell POUR, MD;  Location: [facility] ENDOSCOPY;  Service: Gastroenterology;;   COLONOSCOPY N/A 10/16/2022   Procedure: COLONOSCOPY;  Surgeon: Therisa Bi, MD;  Location: [facility] ENDOSCOPY;  Service: Gastroenterology;  Laterality: N/A;   ESOPHAGOGASTRODUODENOSCOPY  N/A 02/01/2024   Procedure: EGD (ESOPHAGOGASTRODUODENOSCOPY);  Surgeon: Jinny Carmine, MD;  Location: [facility] ENDOSCOPY;  Service: Endoscopy;  Laterality: N/A;   ESOPHAGOGASTRODUODENOSCOPY (EGD) WITH PROPOFOL  N/A 10/26/2022   Procedure: ESOPHAGOGASTRODUODENOSCOPY (EGD) WITH PROPOFOL ;  Surgeon: Maryruth Ole DASEN, MD;  Location: [facility] ENDOSCOPY;  Service: Endoscopy;  Laterality: N/A;   ESOPHAGOGASTRODUODENOSCOPY (EGD) WITH PROPOFOL  N/A 10/14/2022   Procedure: ESOPHAGOGASTRODUODENOSCOPY (EGD) WITH PROPOFOL ;  Surgeon: Toledo, Ladell POUR, MD;  Location: [facility] ENDOSCOPY;  Service: Gastroenterology;  Laterality: N/A;   HEMOSTASIS CLIP PLACEMENT  02/01/2024   Procedure: CONTROL OF HEMORRHAGE, GI TRACT, ENDOSCOPIC, BY CLIPPING OR OVERSEWING;  Surgeon: Jinny Carmine, MD;  Location: [facility] ENDOSCOPY;  Service: Endoscopy;;   INCISION AND DRAINAGE ABSCESS Left 02/27/2023   Procedure: INCISION AND DRAINAGE ABSCESS LEFT FINGER AND ARM;  Surgeon: Robbin Redell Ned, MD;  Location: [facility] ORS;  Service: Orthopedics;  Laterality: Left;   TONSILLECTOMY      Medications Prior to Admission  Medication Sig Dispense Refill Last Dose/Taking   [EXPIRED] amoxicillin -clavulanate (AUGMENTIN ) 875-125 MG tablet Take 1 tablet by mouth 2 (two) times daily for 7 days. 14 tablet 0 Unknown   aspirin  EC 81 MG tablet Take 81 mg by mouth.  Take 1 tablet (81 mg total) by mouth once daily   Unknown   atorvastatin  (LIPITOR) 40 MG tablet Take 40 mg by mouth daily.    Unknown   baclofen  (LIORESAL ) 10 MG tablet Take 15 mg by mouth every 8 (eight) hours.   Unknown  cetirizine (ZYRTEC) 10 MG tablet Take 10 mg by mouth daily as needed for allergies.   Unknown   DULoxetine  (CYMBALTA ) 30 MG capsule Take 90 mg by mouth daily.   Unknown   finasteride  (PROSCAR ) 5 MG tablet Take 5 mg by mouth daily.   Unknown   furosemide  (LASIX ) 80 MG tablet Take 80 mg by mouth 2 (two) times daily.   Unknown   gabapentin  (NEURONTIN ) 600 MG tablet Take 600 mg by mouth 3  (three) times daily.   Unknown   insulin  degludec (TRESIBA) 200 UNIT/ML FlexTouch Pen Inject 20 Units into the skin at bedtime.   Unknown   insulin  glargine, 1 Unit Dial, (TOUJEO  SOLOSTAR) 300 UNIT/ML Solostar Pen Inject 20 Units into the skin at bedtime.   Unknown   insulin  lispro (HUMALOG) 100 UNIT/ML KwikPen Inject 20 Units into the skin 3 (three) times daily.   Unknown   loperamide  (IMODIUM  A-D) 2 MG tablet Take 2 mg by mouth as needed for diarrhea or loose stools.   Unknown   melatonin 5 MG TABS Take 1 tablet (5 mg total) by mouth at bedtime.   Unknown   metoprolol  succinate (TOPROL -XL) 50 MG 24 hr tablet Take 50 mg by mouth at bedtime.   Unknown   MOUNJARO 2.5 MG/0.5ML Pen Inject 2.5 mg into the skin once a week.   Unknown   MOVANTIK 25 MG TABS tablet Take 25 mg by mouth daily.   Unknown   nitrofurantoin  (MACRODANTIN ) 50 MG capsule Take 50 mg by mouth every 6 (six) hours.   Unknown   oxyCODONE  (ROXICODONE ) 15 MG immediate release tablet Take 15 mg by mouth every 6 (six) hours as needed.   Unknown   oxyCODONE  (ROXICODONE ) 15 MG immediate release tablet Take 1 tablet (15 mg total) by mouth every 6 (six) hours as needed for pain. 30 tablet 0 Unknown   potassium chloride  (MICRO-K ) 10 MEQ CR capsule Take by mouth 2 (two) times daily. :1 Tablet(s) By Mouth Twice Daily   Unknown   pregabalin  (LYRICA ) 150 MG capsule Take 150 mg by mouth 3 (three) times daily.   Unknown   senna (SENOKOT) 8.6 MG TABS tablet Take 2 tablets by mouth daily.   Unknown   senna-docusate (SENOKOT-S) 8.6-50 MG tablet Take 2 tablets by mouth 2 (two) times daily.   Unknown   tamsulosin  (FLOMAX ) 0.4 MG CAPS capsule Take 0.8 mg by mouth daily.   Unknown   acetaminophen  (TYLENOL ) 325 MG tablet Take 2 tablets (650 mg total) by mouth every 6 (six) hours as needed for mild pain (pain score 1-3) (or Fever >/= 101).   Unknown   bisacodyl  (DULCOLAX) 10 MG suppository Place 10 mg rectally daily as needed for moderate constipation.    Unknown   buPROPion  (WELLBUTRIN  XL) 150 MG 24 hr tablet Take 1 tablet (150 mg total) by mouth daily. 30 tablet 0    diclofenac Sodium (VOLTAREN) 1 % GEL Apply 2 g topically daily as needed (for pain).   Unknown   diphenhydrAMINE  (BENADRYL ) 25 mg capsule Take 1 capsule (25 mg total) by mouth every 6 (six) hours as needed for allergies or itching (30 minutes prior to starting Vancomycin ). (Patient not taking: Reported on 10/06/2023)   Unknown   HUMULIN  R 100 UNIT/ML injection Inject 0.1 mLs (10 Units total) into the skin 3 (three) times daily with meals.   Unknown   mupirocin  ointment (BACTROBAN ) 2 % Apply to penis topically as needed.   Unknown   oxymetazoline  (  AFRIN) 0.05 % nasal spray Place 1 spray into both nostrils 2 (two) times daily as needed for congestion.   Unknown   polyethylene glycol (MIRALAX  / GLYCOLAX ) 17 g packet Take 17 g by mouth daily as needed.   Unknown   Social History   Socioeconomic History   Marital status: Legally Separated    Spouse name: Not on file   Number of children: Not on file   Years of education: Not on file   Highest education level: Not on file  Occupational History   Not on file  Tobacco Use   Smoking status: Never   Smokeless tobacco: Never  Vaping Use   Vaping status: Never Used  Substance and Sexual Activity   Alcohol use: No   Drug use: No   Sexual activity: Not Currently  Other Topics Concern   Not on file  Social History Narrative   Living with mother now   Wheelchair bound at baseline   Social Drivers of Health   Financial Resource Strain: Low Risk  (04/04/2023)   Received from [facility]   Overall Financial Resource Strain (CARDIA)    Difficulty of Paying Living Expenses: Not hard at all  Food Insecurity: Food Insecurity Present (02/02/2024)   Hunger Vital Sign    Worried About Running Out of Food in the Last Year: Often true    Ran Out of Food in the Last Year: Often true  Transportation Needs: Unmet  Transportation Needs (02/02/2024)   PRAPARE - Administrator, Civil Service (Medical): Yes    Lack of Transportation (Non-Medical): Yes  Physical Activity: Not on file  Stress: Not on file  Social Connections: Not on file  Intimate Partner Violence: Not At Risk (02/02/2024)   Humiliation, Afraid, Rape, and Kick questionnaire    Fear of Current or Ex-Partner: No    Emotionally Abused: No    Physically Abused: No    Sexually Abused: No    Family History  Problem Relation Age of Onset   COPD Mother    Diabetes Father    CAD Father    Kidney cancer Brother      Vitals:   02/03/24 0100 02/03/24 0200 02/03/24 0300 02/03/24 0324  BP: 123/62 115/64 111/69 113/69  Pulse: 63 60 62   Resp: 11 10 17 18   Temp:    97.8 F (36.6 C)  TempSrc:    Oral  SpO2: 100% 99% 100% 100%  Weight:        PHYSICAL EXAM General: Chronically ill appearing male, well nourished, in no acute distress. HEENT: Normocephalic and atraumatic. Neck: No JVD.  Lungs: Normal respiratory effort on 2L [place]. Clear bilaterally to auscultation. No wheezes, crackles, rhonchi.  Heart: HRRR. Normal S1 and S2 without gallops or murmurs.  Abdomen: Non-distended appearing.  Msk: Normal strength and tone for age. Extremities: S/p L AKA and R BKA. Neuro: Alert and oriented X 3. Somnolent. Psych: Answers questions appropriately.   Labs: Basic Metabolic Panel: Recent Labs    02/01/24 0410 02/02/24 0504 02/03/24 0425  NA 136 138 135  K 3.4* 3.3* 4.0  CL 103 106 104  CO2 23 25 27   GLUCOSE 359* 125* 145*  BUN 49* 20 14  CREATININE 1.06 0.67 0.60*  CALCIUM  8.1* 7.9* 8.0*  MG 2.1 2.1 2.1  PHOS 3.9 3.1  --    Liver Function Tests: No results for input(s): AST, ALT, ALKPHOS, BILITOT, PROT, ALBUMIN  in the last 72 hours. No results for  input(s): LIPASE, AMYLASE in the last 72 hours. CBC: Recent Labs    02/02/24 0504 02/02/24 0813 02/02/24 2144 02/03/24 0425  WBC 11.8*  --   --  5.7  HGB  7.4*   < > 7.9* 8.0*  HCT 23.0*   < > 25.2* 24.2*  MCV 82.1  --   --  80.1  PLT 208  --   --  178   < > = values in this interval not displayed.   Cardiac Enzymes: No results for input(s): CKTOTAL, CKMB, CKMBINDEX, TROPONINIHS in the last 72 hours. BNP: No results for input(s): BNP in the last 72 hours. D-Dimer: No results for input(s): DDIMER in the last 72 hours. Hemoglobin A1C: No results for input(s): HGBA1C in the last 72 hours. Fasting Lipid Panel: No results for input(s): CHOL, HDL, LDLCALC, TRIG, CHOLHDL, LDLDIRECT in the last 72 hours. Thyroid  Function Tests: No results for input(s): TSH, T4TOTAL, T3FREE, THYROIDAB in the last 72 hours.  Invalid input(s): FREET3 Anemia Panel: No results for input(s): VITAMINB12, FOLATE, FERRITIN, TIBC, IRON , RETICCTPCT in the last 72 hours.    Radiology: DG Hand Complete Right Result Date: 02/02/2024 EXAM: 3 or more VIEW(S) XRAY OF THE RIGHT HAND 02/02/2024 03:11:00 PM COMPARISON: None available. CLINICAL HISTORY: Wound dehiscence FINDINGS: BONES AND JOINTS: Osteolytic changes of the distal phalanx of the right index finger. SOFT TISSUES: Soft tissue swelling and dorsal and anterior ulceration associated with the right index finger. Soft tissue ulceration at the tip of the right middle finger. IMPRESSION: 1. Osteolytic changes of the distal phalanx of the right index finger with associated soft tissue swelling and dorsal and anterior ulceration. Findings consistent with osteomyelitis. 2. Soft tissue ulceration at the tip of the right middle finger without underlying osteolytic change. Electronically signed by: Franky Stanford MD 02/02/2024 08:14 PM EST RP Workstation: [id number]   ECHOCARDIOGRAM COMPLETE Result Date: 02/01/2024    ECHOCARDIOGRAM REPORT   Patient Name:   CONG HIGHTOWER Date of Exam: 02/01/2024 Medical Rec #:  [id number]       Height:       71.0 in Accession #:    [id number]      Weight:        273.0 lb Date of Birth:  June 04, 1965        BSA:          2.407 m Patient Age:    58 years        BP:           129/59 mmHg Patient Gender: M               HR:           74 bpm. Exam Location:  [facility] Procedure: 2D Echo, Cardiac Doppler and Color Doppler (Both Spectral and Color            Flow Doppler were utilized during procedure). Indications:     Atrial Fibrillation I48.91  History:         Patient has no prior history of Echocardiogram examinations.                  CHF; Risk Factors:Diabetes.  Sonographer:     Christopher Furnace Referring Phys:  [id number] LANETA BLUNT Diagnosing Phys: Dwayne D Callwood MD  Sonographer Comments: Patient is obese and suboptimal apical window. IMPRESSIONS  1. Left ventricular ejection fraction, by estimation, is 55 to 60%. The left ventricle has normal function. The left ventricle has no regional wall motion  abnormalities. Left ventricular diastolic parameters are consistent with Grade II diastolic dysfunction (pseudonormalization).  2. Right ventricular systolic function is normal. The right ventricular size is normal.  3. The mitral valve is normal in structure. No evidence of mitral valve regurgitation.  4. The aortic valve is normal in structure. Aortic valve regurgitation is not visualized. FINDINGS  Left Ventricle: Left ventricular ejection fraction, by estimation, is 55 to 60%. The left ventricle has normal function. The left ventricle has no regional wall motion abnormalities. Strain was performed and the global longitudinal strain is indeterminate. The left ventricular internal cavity size was normal in size. There is borderline concentric left ventricular hypertrophy. Left ventricular diastolic parameters are consistent with Grade II diastolic dysfunction (pseudonormalization). Right Ventricle: The right ventricular size is normal. No increase in right ventricular wall thickness. Right ventricular systolic function is normal. Left Atrium: Left atrial size was normal in  size. Right Atrium: Right atrial size was normal in size. Pericardium: There is no evidence of pericardial effusion. Mitral Valve: The mitral valve is normal in structure. No evidence of mitral valve regurgitation. MV peak gradient, 5.0 mmHg. The mean mitral valve gradient is 2.0 mmHg. Tricuspid Valve: The tricuspid valve is normal in structure. Tricuspid valve regurgitation is trivial. Aortic Valve: The aortic valve is normal in structure. Aortic valve regurgitation is not visualized. Aortic valve mean gradient measures 10.0 mmHg. Aortic valve peak gradient measures 19.4 mmHg. Aortic valve area, by VTI measures 2.89 cm. Pulmonic Valve: The pulmonic valve was normal in structure. Pulmonic valve regurgitation is not visualized. Aorta: The ascending aorta was not well visualized. IAS/Shunts: No atrial level shunt detected by color flow Doppler. Additional Comments: 3D was performed not requiring image post processing on an independent workstation and was indeterminate.  LEFT VENTRICLE PLAX 2D LVIDd:         4.90 cm   Diastology LVIDs:         3.40 cm   LV e' medial:    10.70 cm/s LV PW:         1.10 cm   LV E/e' medial:  9.7 LV IVS:        1.20 cm   LV e' lateral:   7.72 cm/s LVOT diam:     2.20 cm   LV E/e' lateral: 13.5 LV SV:         109 LV SV Index:   45 LVOT Area:     3.80 cm LV IVRT:       79 msec  RIGHT VENTRICLE RV Basal diam:  4.20 cm     PULMONARY VEINS RV Mid diam:    1.80 cm     Diastolic Velocity: 31.80 cm/s RV S prime:     20.90 cm/s  S/D Velocity:       1.30 TAPSE (M-mode): 3.7 cm      Systolic Velocity:  42.90 cm/s LEFT ATRIUM           Index        RIGHT ATRIUM           Index LA diam:      2.90 cm 1.20 cm/m   RA Area:     18.00 cm LA Vol (A4C): 79.1 ml 32.87 ml/m  RA Volume:   38.40 ml  15.96 ml/m  AORTIC VALVE AV Area (Vmax):    2.75 cm AV Area (Vmean):   2.74 cm AV Area (VTI):     2.89 cm AV Vmax:  220.00 cm/s AV Vmean:          143.000 cm/s AV VTI:            0.377 m AV Peak  Grad:      19.4 mmHg AV Mean Grad:      10.0 mmHg LVOT Vmax:         159.00 cm/s LVOT Vmean:        103.000 cm/s LVOT VTI:          0.287 m LVOT/AV VTI ratio: 0.76  AORTA Ao Root diam: 3.00 cm MITRAL VALVE                TRICUSPID VALVE MV Area (PHT): 3.19 cm     TR Peak grad:   6.6 mmHg MV Area VTI:   2.91 cm     TR Vmax:        128.00 cm/s MV Peak grad:  5.0 mmHg MV Mean grad:  2.0 mmHg     SHUNTS MV Vmax:       1.12 m/s     Systemic VTI:  0.29 m MV Vmean:      71.5 cm/s    Systemic Diam: 2.20 cm MV Decel Time: 238 msec MV E velocity: 104.00 cm/s MV A velocity: 85.90 cm/s MV E/A ratio:  1.21 Cara JONETTA Lovelace MD Electronically signed by Cara JONETTA Lovelace MD Signature Date/Time: 02/01/2024/1:24:12 PM    Final    CT Angio Abd/Pel w/ and/or w/o Result Date: 01/31/2024 CLINICAL DATA:  Persistent GI bleed with melena. EXAM: CT ANGIOGRAPHY ABDOMEN AND PELVIS WITH CONTRAST AND WITHOUT CONTRAST TECHNIQUE: Multidetector CT imaging of the abdomen and pelvis was performed using the standard protocol during bolus administration of intravenous contrast. Multiplanar reconstructed images and MIPs were obtained and reviewed to evaluate the vascular anatomy. RADIATION DOSE REDUCTION: This exam was performed according to the departmental dose-optimization program which includes automated exposure control, adjustment of the mA and/or kV according to patient size and/or use of iterative reconstruction technique. CONTRAST:  OMNIPAQUE  IOHEXOL  350 MG/ML SOLN COMPARISON:  CT abdomen pelvis 10/24/2022 FINDINGS: VASCULAR Aorta: Normal abdominal aorta with normal caliber and no significant atherosclerosis. No dissection. Celiac: Normally patent. Normally patent branch vessels and branch anatomy. SMA: Normally patent. Renals: Normally patent bilateral single renal arteries. IMA: Normally patent IMA. Inflow: Normally patent bilateral iliac arteries. Proximal Outflow: Normally patent bilateral common femoral arteries and femoral  bifurcations. Veins: Venous phase imaging demonstrates normal patency of venous structures in the abdomen and pelvis. No mesenteric venous thrombosis. The portal vein is normally patent. No gastric or esophageal varices identified. Review of the MIP images confirms the above findings. NON-VASCULAR Lower chest: No acute abnormality.  No hiatal hernia. Hepatobiliary: Underlying hepatic steatosis. No overt cirrhosis. No hepatic masses or biliary dilatation. The gallbladder is unremarkable. Pancreas: Diffusely atrophic pancreas without mass or inflammation. No pancreatic ductal dilatation. Spleen: Normal in size without focal abnormality. Adrenals/Urinary Tract: Normal adrenal glands. Kidneys demonstrate no hydronephrosis, focal lesion or calculi bilaterally. The urinary bladder is tremendously distended measuring up to roughly 20.7 x 14 x 15.1 cm for estimated volume of 2275 mL. Stomach/Bowel: Stomach and duodenum demonstrate no evidence of ulcer, mass or lesion. No evidence of active bleeding into the gastrointestinal tract on arterial or venous phases of imaging. Bowel shows no evidence of obstruction, ileus, inflammation or lesion. The appendix is surgically absent. No free intraperitoneal air. Lymphatic: No enlarged lymph nodes identified. Reproductive: Prostate is unremarkable. Other: Irregular subcutaneous soft  tissue density in the subcutaneous fat of the upper right abdomen at roughly the level of the gallbladder may relate to a subcutaneous injection site or focal trauma. No ascites. Musculoskeletal: Degenerative disc disease of the lumbar spine, most severely at L5-S1. IMPRESSION: 1. No evidence of active bleeding into the gastrointestinal tract on arterial or venous phases of imaging. 2. No evidence of peptic ulcer disease of the upper gastrointestinal tract by CT or upper tract varices. 3. Hepatic steatosis. 4. Tremendously distended urinary bladder with estimated volume of 2275 mL. 5. Irregular  subcutaneous soft tissue density in the subcutaneous fat of the upper right abdomen at roughly the level of the gallbladder may relate to a subcutaneous injection site or focal trauma. Electronically Signed   By: Marcey Moan M.D.   On: 01/31/2024 18:47   DG Chest Port 1 View Result Date: 01/31/2024 EXAM: 1 VIEW(S) XRAY OF THE CHEST 01/31/2024 03:05:27 PM COMPARISON: None available. CLINICAL HISTORY: Encounter for central line placement. FINDINGS: LINES, TUBES AND DEVICES: Interval placement of right internal jugular catheter with distal tip in expected position of SVC. LUNGS AND PLEURA: No focal pulmonary opacity. No pleural effusion. No pneumothorax. HEART AND MEDIASTINUM: The distal tip of the right internal jugular catheter is in the expected position of the SVC. No acute abnormality of the cardiac and mediastinal silhouettes. BONES AND SOFT TISSUES: No acute osseous abnormality. IMPRESSION: 1. Right internal jugular catheter with the distal tip in the expected position within the superior vena cava. Electronically signed by: Lynwood Seip MD 01/31/2024 03:26 PM EST RP Workstation: [id number]   DG Chest Port 1 View Result Date: 01/31/2024 EXAM: 1 VIEW(S) XRAY OF THE CHEST 01/31/2024 10:43:58 AM COMPARISON: 09/06/2023 CLINICAL HISTORY: SOB (shortness of breath); Atrial fibrillation with RVR (HCC) FINDINGS: LUNGS AND PLEURA: Low lung volumes. No focal pulmonary opacity. No pleural effusion. No pneumothorax. HEART AND MEDIASTINUM: No acute abnormality of the cardiac and mediastinal silhouettes. BONES AND SOFT TISSUES: No acute osseous abnormality. IMPRESSION: 1. Low lung volumes, No acute cardiopulmonary process. Electronically signed by: Katheleen Faes MD 01/31/2024 10:58 AM EST RP Workstation: [id number]    ECHO as above  TELEMETRY (personally reviewed): NSR rate 70s  EKG (personally reviewed): none for review this admission  Data reviewed by me 02/03/2024: last 24h vitals tele labs imaging I/O ED  provider note, admission H&P, hospitalist progress note  Principal Problem:   GI bleed Active Problems:   Acute blood loss anemia   Upper GI bleeding   Melena   Hematochezia   DU (duodenal ulcer)   Blood in stool   Duodenal ulcer   Blood loss anemia   Bipolar disorder (HCC)    ASSESSMENT AND PLAN:  [name] is a 58 y.o. male  with a past medical history of paroxysmal atrial fibrillation not on AC, chronic diastolic heart failure, hx R BKA and L AKA, L forearm amputation, hx GIB 2/2 peptic ulcer who presented to the ED on 01/30/2024 for back pain, black tarry stool. Given transfusion overnight for significant anemia. Went into AF RVR this AM. Cardiology was consulted for further evaluation.   # Atrial fibrillation RVR # Paroxysmal atrial fibrillation # Suspected acute on chronic HFpEF # GI bleed, ABLA - s/p hemoclips x2 02/01/2024 Patient presented with back pain and episode of black tarry stool, found to have Hgb down to 6.0 from 11.3 three days prior. Transfused overnight with not much response. 01/31/2024 converted to AF RVR. Echo this admission with EF 55-60%, no WMAs, grade  II diastolic dysfunction.  -Continue to PO amiodarone  loading with 400 mg BID for 7 days followed by 200 daily.  -Ultimately patient would benefit from anticoagulation as CHADS-VASc is 4. Hgb today 8.0 - now dx osteomyelitis of R fingers, will defer starting AC today.  -Continue aspirin  81 mg daily, atorvastatin  80 mg daily.  -Further management per primary team, ortho.   This patient's plan of care was discussed and created with Dr. Florencio and he is in agreement.  Signed: Danita Bloch, PA-C  02/03/2024, 8:13 AM [facility] Cardiology

## 2024-02-03 NOTE — Progress Notes (Signed)
 PROGRESS NOTE    Russell [name]  FMW:[id number] DOB: 1966/02/06 DOA: 01/30/2024 PCP: Eliverto [name], MD  252A/252A-AA  LOS: 4 days   Brief hospital course:   Assessment & Plan: Russell [name] is a 58 year old male with a medical history significant for peripheral vascular disease s/p right sided BKA, left sided AKA, left sided forearm amputation, chronic HFrEF with recovered LVEF, Type II Diabetes Mellitus, BPH, hypertension, hyperlipidemia and a GI bleed secondary to peptic ulcer, who presented to [facility] ED with concern for black tarry stool. At time of arrival he had complaints of tailbone pain, reporting he has been taking [employer] powder for 3 days. Upon arrival he was hemodynamically stable with a hemoglobin was 6.0.   01/31/24: H&H not improving after 2 units pRBCs, pending repeat after 4th unit pRBCs. Developed afib rvr. Placed on amiodarone  gtt. Later developed hypotension requiring levophed  gtt. transferred to the ICU.  Off levophed  on 12/4.  Transferred to [facility] on 12/5.   #Hypovolemic Shock s/t acute GIB --off pressor.  #Acute Metabolic Encephalopathy- resolved #Bipolar Disorder #Chronic Pain --d/c IV dilaudid  --cont oxycodone  PRN   #Acute Hypoxic Respiratory Failure s/t Hypovolemic Shock --resolved.  Currently on RA.   #Atrial Fibrillation with RVR --cardio consulted --cont amio --hold anticoagulation for now due to bleeding finger  Right finger osteomyelitis --Has seen ortho and may need amputation but pt hoping to preserve function.  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 #Hyponatremia Hx: CKD Stage 4 - Strict I/O - Trend BMP and monitor renal function - Avoid nephrotoxins as able - Ensure renal perfusion - ICU electrolyte replacement protocol  - Pharmacy to assist with replacement - Trend lactic - ABG as indicated   #Acute  Gastrointestinal Bleed Hx: Gastric Ulcers - Continue PPI - Diet: NPO - Constipation protocol prn - Antiemetics prn - Vascular following; appreciate input - CTA Abdomen and Pelvis to localize bleeding source with possible embolization    #Acute Blood Loss Anemia s/t GI Bleed - Trend CBC  - Trend H&H - Hgb 7.1 - Transfuse if <7; will receive another unit pRBCs to make 5 total - Monitor for s/sx of bleeding - Hold AC for now    #Type II Diabetes Mellitus #DKA ~ Resolved - ICU hypo/hyperglycemia protocol - SSI - CBG Q4h - AG closed, no longer requiring insulin  gtt   #Leukocytosis likely reactive in setting of Shock - Trend WBC and monitor fever curve - Blood cultures pending - Hold off on abx for now   DVT prophylaxis: Lovenox  SQ Code Status: DNR  Family Communication:  Level of care: Med-Surg Dispo:   The patient is from: home Anticipated d/c is to: SNF Anticipated d/c date is: 2-3 days   Subjective and Interval History:  Pt reported pain and rash from lying on his side.   Objective: Vitals:   02/03/24 0324 02/03/24 0838 02/03/24 1235 02/03/24 1629  BP: 113/69 (!) 112/58 (!) 109/50 (!) 112/58  Pulse:  65 63 64  Resp: 18   17  Temp: 97.8 F (36.6 C) 97.7 F (36.5 C) 97.7 F (36.5 C) 98 F (36.7 C)  TempSrc: Oral   Oral  SpO2: 100% 97% 94% 93%  Weight:        Intake/Output Summary (Last 24 hours) at 02/03/2024 1859 Last data filed at 02/03/2024 9061 Gross per 24 hour  Intake  811.78 ml  Output 375 ml  Net 436.78 ml   Filed Weights   01/30/24 0813  Weight: 123.8 kg    Examination:   Constitutional: NAD, AAOx3 HEENT: conjunctivae and lids normal, EOMI CV: No cyanosis.   RESP: normal respiratory effort, on RA Extremities: right BKA, left AKA, left forearm amputation, wrapping of right middle finger Neuro: II - XII grossly intact.   Psych: Normal mood and affect.  Appropriate judgement and reason Foley present   Data Reviewed: I have personally  reviewed labs and imaging studies  Time spent: 50 minutes  Ellouise Haber, MD [facility] If 7PM-7AM, please contact night-coverage 02/03/2024, 6:59 PM

## 2024-02-03 NOTE — NC FL2 (Signed)
 [place]  MEDICAID FL2 LEVEL OF CARE FORM     IDENTIFICATION  Patient Name: Russell [name] Birthdate: Sep 28, 1965 Sex: male Admission Date (Current Location): 01/30/2024  [facility] and [place] Number:  Chiropodist and Address:  [facility], [address] [place], [place] [address]      Provider Number: [id number]  Attending Physician Name and Address:  Awanda City, MD  Relative Name and Phone Number:       Current Level of Care: Hospital Recommended Level of Care: Skilled Nursing Facility Prior Approval Number:    Date Approved/Denied:   PASRR Number: [id number]  Discharge Plan: SNF    Current Diagnoses: Patient Active Problem List   Diagnosis Date Noted   Duodenal ulcer 02/02/2024   Blood loss anemia 02/02/2024   Bipolar disorder (HCC) 02/02/2024   DU (duodenal ulcer) 02/01/2024   Blood in stool 02/01/2024   Hematochezia 01/31/2024   GI bleed 01/30/2024   Melena 01/30/2024   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) 09/09/2023   Acute osteomyelitis of metatarsal bone of left foot (HCC) 09/06/2023   OSA (obstructive sleep apnea) 09/06/2023   Chronic respiratory failure with hypoxia (HCC) 09/06/2023   Sepsis due to cellulitis (HCC) 02/27/2023   PAD (peripheral artery disease) 11/10/2022   Generalized weakness 11/09/2022   Ambulatory dysfunction 11/09/2022   Residual hemorrhoidal skin tags 11/06/2022   Acute gastritis 11/06/2022   Major depressive disorder, recurrent episode, moderate (HCC) 10/28/2022   Dyslipidemia 10/24/2022   Anxiety and depression 10/24/2022   Essential hypertension 10/24/2022   Type 2 diabetes mellitus with peripheral neuropathy (HCC) 10/24/2022   Acute gastric ulcer with hemorrhage 10/24/2022   Acute urinary retention 10/17/2022   Acute respiratory failure with hypoxia (HCC) 10/15/2022   Multiple gastric ulcers 10/14/2022   Hyperosmolar hyperglycemic state (HHS) (HCC) 10/13/2022    HLD (hyperlipidemia) 10/13/2022   Acute on chronic diastolic CHF (congestive heart failure) (HCC) 10/13/2022   Depression with anxiety 10/13/2022   Hyperkalemia 10/13/2022   Elevated lactic acid level 10/13/2022   Rectal bleeding 10/13/2022   Acute blood loss anemia 10/13/2022   Wound infection_ left lower leg 10/13/2022   Upper GI bleeding 10/13/2022   Hypovolemic shock (HCC) 10/13/2022   Epigastric abdominal pain 10/13/2022   Sciatica of right side 10/13/2022   AKI (acute kidney injury) 10/13/2022   Hypotension 11/21/2020   Atrial flutter with rapid ventricular response (HCC) 11/21/2020   Chronic kidney disease, stage 4, severely decreased GFR (HCC) 06/14/2018   Chronic venous insufficiency 06/14/2018   Cellulitis of left lower extremity 02/01/2018   Cellulitis 05/18/2017   Hyperlipidemia, mixed 01/10/2017   Cellulitis and abscess of left leg 08/08/2016   Diabetes with ulcer of leg (HCC) 08/08/2016   Hyperglycemia 08/08/2016   Morbid obesity (HCC) 08/08/2016   Cellulitis and abscess of leg 08/08/2016   Adjustment disorder with depressed mood 11/21/2014   Other social stressor 11/21/2014   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 Wound on right finger: Gauze.)  Personal Care Assistance Level of Assistance  Bathing, Feeding, Dressing Bathing Assistance: Maximum assistance Feeding assistance: Limited assistance Dressing Assistance: Maximum assistance     Functional Limitations Info   Sight, Hearing, Speech Sight Info: Adequate Hearing Info: Adequate Speech Info: Adequate    SPECIAL CARE FACTORS FREQUENCY  PT (By licensed PT), OT (By licensed OT)     PT Frequency: 5 x week OT Frequency: 5 x week            Contractures Contractures Info: Not present    Additional Factors Info  Isolation Precautions Code Status Info: DNR Allergies Info: Fluoxetine, Shellfish Allergy, Sulfa Antibiotics, Vancomycin , Metformin     Isolation Precautions Info: Contact: ESBL, MRSA     Current Medications (02/03/2024):  This is the current hospital active medication list Current Facility-Administered Medications  Medication Dose Route Frequency Provider Last Rate Last Admin   0.9 %  sodium chloride  infusion (Manually program via Guardrails IV Fluids)   Intravenous Once Keene, Jeremiah D, NP       amiodarone  (PACERONE ) tablet 400 mg  400 mg Oral BID Hudson, Caralyn, PA-C   400 mg at 02/03/24 9077   Followed by   NOREEN ON 02/09/2024] amiodarone  (PACERONE ) tablet 200 mg  200 mg Oral Daily Hudson, Caralyn, PA-C       atorvastatin  (LIPITOR) tablet 40 mg  40 mg Oral Daily Wohl, Darren, MD   40 mg at 02/03/24 9077   baclofen  (LIORESAL ) tablet 15 mg  15 mg Oral Q8H Jinny Carmine, MD   15 mg at 02/03/24 9077   bisacodyl  (DULCOLAX) suppository 10 mg  10 mg Rectal Daily PRN Jinny Carmine, MD       Chlorhexidine  Gluconate Cloth 2 % PADS 6 each  6 each Topical Daily Jinny Carmine, MD   6 each at 02/03/24 9076   DULoxetine  (CYMBALTA ) DR capsule 90 mg  90 mg Oral Daily Aleskerov, Fuad, MD   90 mg at 02/03/24 9077   finasteride  (PROSCAR ) tablet 5 mg  5 mg Oral Daily Jinny Carmine, MD   5 mg at 02/03/24 9077   insulin  aspart (novoLOG ) injection 0-20 Units  0-20 Units Subcutaneous Q4H Jinny Carmine, MD   11 Units at 02/03/24 1231   insulin  glargine-yfgn (SEMGLEE ) injection 30 Units  30 Units Subcutaneous QHS Jinny Carmine, MD   30 Units at 02/02/24 2137   linezolid  (ZYVOX ) IVPB 600 mg  600 mg  Intravenous Q12H Rust-Chester, Britton L, NP 300 mL/hr at 02/03/24 0928 600 mg at 02/03/24 9071   loperamide  (IMODIUM ) capsule 2 mg  2 mg Oral PRN Wohl, Darren, MD       melatonin tablet 5 mg  5 mg Oral QHS Wohl, Darren, MD   5 mg at 02/02/24 2213   metoprolol  tartrate (LOPRESSOR ) injection 5 mg  5 mg Intravenous Q4H PRN Jinny Carmine, MD       ondansetron  (ZOFRAN ) tablet 4 mg  4 mg Oral Q6H PRN Jinny Carmine, MD       Or   ondansetron  (ZOFRAN ) injection 4 mg  4 mg Intravenous Q6H PRN Jinny Carmine, MD   4 mg at 02/03/24 1220   Oral care mouth rinse  15 mL Mouth Rinse PRN Aleskerov, Fuad, MD       oxyCODONE  (Oxy IR/ROXICODONE ) immediate release tablet 15 mg  15 mg Oral Q6H PRN Jinny Carmine, MD   15 mg at 02/02/24 2322   pantoprazole  (PROTONIX ) injection 40 mg  40 mg Intravenous Q12H Jinny Carmine, MD  40 mg at 02/03/24 9078   piperacillin -tazobactam (ZOSYN ) IVPB 3.375 g  3.375 g Intravenous Q8H Rust-Chester, Britton L, NP 12.5 mL/hr at 02/03/24 1436 3.375 g at 02/03/24 1436   pregabalin  (LYRICA ) capsule 150 mg  150 mg Oral TID Jinny Carmine, MD   150 mg at 02/03/24 9077   senna-docusate (Senokot-S) tablet 2 tablet  2 tablet Oral QHS PRN Jinny Carmine, MD       sodium chloride  (OCEAN) 0.65 % nasal spray 1 spray  1 spray Each Nare PRN Keene, Jeremiah D, NP   1 spray at 02/02/24 9095   tamsulosin  (FLOMAX ) capsule 0.8 mg  0.8 mg Oral Daily Jinny Carmine, MD   0.8 mg at 02/03/24 9077     Discharge Medications: Please see discharge summary for a list of discharge medications.  Relevant Imaging Results:  Relevant Lab Results:   Additional Information SS#: [id number]. May need LTC after rehab.  Lauraine JAYSON Carpen, LCSW

## 2024-02-03 NOTE — Evaluation (Signed)
 Physical Therapy Evaluation Patient Details Name: Russell [name] MRN: [id number] DOB: 01/01/1966 Today's Date: 02/03/2024  History of Present Illness  Russell [name] is a 58 y.o. male with medical history significant of PVD status post right-sided BKA, left-sided AKA, left-sided forearm amputation, GI bleed secondary to peptic ulcer, chronic HFrEF with recovered LVEF, HTN, HLD, BPH, morbid obesity, presented with black tarry stool.  Clinical Impression  Pt admitted with above diagnosis. Pt currently with functional limitations due to the deficits listed below (see PT Problem List). Pt received reclined in bed agreeable to PT. PTA pt living alone relying on power w/c for mobility. Ambulates some in his apartment on his residual limbs. Pt reports that his power w/c broke.   To date, pt reports moderate sacral pain. Reliant on maxA+2 for supine to sitting to the right really limited due to pt's L trans radial amputation. Needs modA+2 to scoot anterior to EOB. Pt with fair sitting balance. Notable R BKA with skin redness/irritation at bottom of residual limb but no skin break down. Pt has mild skin breakdown on LLE at end of residual limb on lateral aspect. Educated pt to eliminate WB through limbs to allow skin to heal due to risk of infection. Pt understanding. Pt able to laterally scoot to the R at EOB with supervision and return to supine then L side lying for pressure relief to sacrum. Pt left with all needs in reach. Anticipate pt is probably close to baseline. Anticipate increased falls risk and difficulty caring for self due to inability to mobilize with broken power w/c thus pt would benefit from skilled PT services < 3 hours/day to address current functional deficits and increased falls risk.      If plan is discharge home, recommend the following: A lot of help with walking and/or transfers;A lot of help with bathing/dressing/bathroom;Assistance with cooking/housework;Assist for  transportation;Help with stairs or ramp for entrance   Can travel by private vehicle   No    Equipment Recommendations    Recommendations for Other Services       Functional Status Assessment Patient has had a recent decline in their functional status and demonstrates the ability to make significant improvements in function in a reasonable and predictable amount of time.     Precautions / Restrictions Precautions Precautions: Fall Recall of Precautions/Restrictions: Intact Restrictions Weight Bearing Restrictions Per Provider Order: No      Mobility  Bed Mobility Overal bed mobility: Needs Assistance Bed Mobility: Supine to Sit, Sit to Supine     Supine to sit: +2 for physical assistance, HOB elevated, Used rails, +2 for safety/equipment Sit to supine: Min assist     Patient Response: Cooperative  Transfers Overall transfer level: Needs assistance                 General transfer comment: lateral scoot to the R towards HOB at supervision    Ambulation/Gait               General Gait Details: does not ambulate at baseline  Stairs            Wheelchair Mobility     Tilt Bed Tilt Bed Patient Response: Cooperative  Modified Rankin (Stroke Patients Only)       Balance Overall balance assessment: Needs assistance Sitting-balance support: Single extremity supported, Feet unsupported Sitting balance-Leahy Scale: Fair Sitting balance - Comments: intermittent use of RUE to support himself  Pertinent Vitals/Pain Pain Assessment Pain Assessment: Faces Faces Pain Scale: Hurts even more Pain Location: sacrum Pain Descriptors / Indicators: Aching, Burning Pain Intervention(s): Limited activity within patient's tolerance, Repositioned    Home Living Family/patient expects to be discharged to:: Private residence Living Arrangements: Alone Available Help at Discharge: Family Type of Home:  Apartment Home Access: Level entry           Additional Comments: power w/c    Prior Function               Mobility Comments: Reports living alone managing in power w/c ADLs Comments: states he walks on his residual limbs in his home     Extremity/Trunk Assessment   Upper Extremity Assessment Upper Extremity Assessment: Defer to OT evaluation    Lower Extremity Assessment Lower Extremity Assessment: RLE deficits/detail;LLE deficits/detail RLE Deficits / Details: R BKA. Notable redness at end of residual limn LLE Deficits / Details: L AKA. Minor skin breakdown on lateral aspect at the end of residual limb    Cervical / Trunk Assessment Cervical / Trunk Assessment: Normal  Communication   Communication Communication: No apparent difficulties    Cognition Arousal: Alert Behavior During Therapy: WFL for tasks assessed/performed   PT - Cognitive impairments: No apparent impairments                                 Cueing       General Comments      Exercises Other Exercises Other Exercises: use of mirror for skin checks on his residual limbs to screen for skin breakdown. Encouraged pt to not weightbear through LE's due to redness of R limb and skin breakdown on L limb.   Assessment/Plan    PT Assessment Patient needs continued PT services  PT Problem List Decreased strength;Decreased range of motion;Decreased activity tolerance;Decreased balance;Pain;Decreased mobility       PT Treatment Interventions DME instruction;Neuromuscular re-education;Patient/family education;Functional mobility training;Wheelchair mobility training;Therapeutic activities;Therapeutic exercise;Balance training    PT Goals (Current goals can be found in the Care Plan section)  Acute Rehab PT Goals Patient Stated Goal: to improve his strength PT Goal Formulation: With patient Time For Goal Achievement: 02/17/24 Potential to Achieve Goals: Good    Frequency Min  1X/week     Co-evaluation PT/OT/SLP Co-Evaluation/Treatment: Yes Reason for Co-Treatment: For patient/therapist safety;Complexity of the patient's impairments (multi-system involvement);To address functional/ADL transfers PT goals addressed during session: Mobility/safety with mobility;Balance;Strengthening/ROM OT goals addressed during session: ADL's and self-care       AM-PAC PT 6 Clicks Mobility  Outcome Measure Help needed turning from your back to your side while in a flat bed without using bedrails?: A Lot Help needed moving from lying on your back to sitting on the side of a flat bed without using bedrails?: A Lot Help needed moving to and from a bed to a chair (including a wheelchair)?: A Lot Help needed standing up from a chair using your arms (e.g., wheelchair or bedside chair)?: Total Help needed to walk in hospital room?: Total Help needed climbing 3-5 steps with a railing? : Total 6 Click Score: 9    End of Session   Activity Tolerance: Patient tolerated treatment well Patient left: in bed (R side lying) Nurse Communication: Mobility status PT Visit Diagnosis: Other abnormalities of gait and mobility (R26.89);Muscle weakness (generalized) (M62.81);History of falling (Z91.81);Pain Pain - part of body:  (sacrum)    Time: 8669-8646 PT  Time Calculation (min) (ACUTE ONLY): 23 min   Charges:   PT Evaluation $PT Eval Low Complexity: 1 Low PT Treatments $Self Care/Home Management: 8-22 PT General Charges $$ ACUTE PT VISIT: 1 Visit         Dorina HERO. Fairly IV, PT, DPT Physical Therapist- [place]  [facility] [place] 02/03/2024, 2:17 PM

## 2024-02-03 NOTE — Evaluation (Signed)
 Occupational Therapy Evaluation Patient Details Name: Russell [name] MRN: [id number] DOB: 10-27-1965 Today's Date: 02/03/2024   History of Present Illness   Russell [name] is a 58 y.o. male with medical history significant of PVD status post right-sided BKA, left-sided AKA, left-sided forearm amputation, GI bleed secondary to peptic ulcer, chronic HFrEF with recovered LVEF, HTN, HLD, BPH, morbid obesity, presented with black tarry stool.   Clinical Impressions Russell [name] was seen for OT evaluation this date. Prior to hospital admission, Russell [name] was MOD I using power w/c. Russell [name] lives alone. Russell [name] currently requires MOD A x2 sup>sit, fair sitting balance. SUPERVISION lateral scoot along EOB and rolling bed level. Russell [name] would benefit from skilled OT to address noted impairments and functional limitations (see below for any additional details). Upon hospital discharge, recommend OT follow up <3 hours/day.    If plan is discharge home, recommend the following:   Help with stairs or ramp for entrance;A lot of help with bathing/dressing/bathroom     Functional Status Assessment   Patient has had a recent decline in their functional status and demonstrates the ability to make significant improvements in function in a reasonable and predictable amount of time.     Equipment Recommendations   Hospital bed     Recommendations for Other Services         Precautions/Restrictions   Precautions Precautions: Fall Recall of Precautions/Restrictions: Intact Restrictions Weight Bearing Restrictions Per Provider Order: No     Mobility Bed Mobility Overal bed mobility: Needs Assistance Bed Mobility: Supine to Sit, Sit to Supine, Rolling Rolling: Supervision   Supine to sit: Mod assist, +2 for physical assistance Sit to supine: Min assist        Transfers Overall transfer level: Needs assistance   Transfers: Bed to chair/wheelchair/BSC            Lateral/Scoot Transfers:  Supervision General transfer comment: lateral scoot along EOB      Balance Overall balance assessment: Needs assistance Sitting-balance support: Single extremity supported, Feet unsupported Sitting balance-Leahy Scale: Fair                                     ADL either performed or assessed with clinical judgement   ADL Overall ADL's : Needs assistance/impaired                                       General ADL Comments: MAX A simulated toileting bed level      Pertinent Vitals/Pain Pain Assessment Pain Assessment: Faces Faces Pain Scale: Hurts little more Pain Location: sacrum Pain Descriptors / Indicators: Aching, Burning Pain Intervention(s): Repositioned, Limited activity within patient's tolerance     Extremity/Trunk Assessment Upper Extremity Assessment Upper Extremity Assessment: Overall WFL for tasks assessed   Lower Extremity Assessment Lower Extremity Assessment:  RLE Deficits / Details: R BKA. Notable redness at end of residual limb LLE Deficits / Details: L AKA. Minor skin breakdown on lateral aspect at the end of residual limb   Cervical / Trunk Assessment Cervical / Trunk Assessment: Normal   Communication Communication Communication: No apparent difficulties   Cognition Arousal: Alert Behavior During Therapy: WFL for tasks assessed/performed Cognition: No apparent impairments  Following commands: Intact       Cueing  General Comments          Exercises Other Exercises Other Exercises: redness on residual limbs   Shoulder Instructions      Home Living Family/patient expects to be discharged to:: Private residence Living Arrangements: Alone Available Help at Discharge: Family Type of Home: Apartment Home Access: Level entry     Home Layout: One level     Bathroom Shower/Tub: Tub/shower unit;Other (comment)   Bathroom Toilet: Handicapped height Bathroom  Accessibility: Yes   Home Equipment: Wheelchair - power;Shower seat   Additional Comments: reports power w/c not working      Prior Functioning/Environment Prior Level of Function : Needs assist             Mobility Comments: Reports living alone managing in power w/c ADLs Comments: states he stands on his residual limbs in his home    OT Problem List: Decreased strength;Decreased range of motion;Decreased activity tolerance;Impaired balance (sitting and/or standing);Decreased safety awareness   OT Treatment/Interventions: Self-care/ADL training;Therapeutic exercise;Energy conservation;DME and/or AE instruction;Therapeutic activities;Patient/family education;Balance training      OT Goals(Current goals can be found in the care plan section)   Acute Rehab OT Goals Patient Stated Goal: to get his w/c fixed OT Goal Formulation: With patient Time For Goal Achievement: 02/17/24 Potential to Achieve Goals: Good ADL Goals Russell [name] Will Perform Grooming: with modified independence;sitting Russell [name] Will Perform Lower Body Dressing: with modified independence;bed level Russell [name] Will Transfer to Toilet: with modified independence;with transfer board;bedside commode   OT Frequency:  Min 2X/week    Co-evaluation Russell [name]/OT/SLP Co-Evaluation/Treatment: Yes Reason for Co-Treatment: For patient/therapist safety;Complexity of the patient's impairments (multi-system involvement);To address functional/ADL transfers Russell [name] goals addressed during session: Mobility/safety with mobility;Balance;Strengthening/ROM OT goals addressed during session: ADL's and self-care      AM-PAC OT 6 Clicks Daily Activity     Outcome Measure Help from another person eating meals?: None Help from another person taking care of personal grooming?: A Little Help from another person toileting, which includes using toliet, bedpan, or urinal?: A Lot Help from another person bathing (including washing, rinsing, drying)?: A Lot Help from  another person to put on and taking off regular upper body clothing?: A Little Help from another person to put on and taking off regular lower body clothing?: A Lot 6 Click Score: 16   End of Session    Activity Tolerance: Patient tolerated treatment well Patient left: in bed;with call bell/phone within reach;with bed alarm set  OT Visit Diagnosis: Other abnormalities of gait and mobility (R26.89);Muscle weakness (generalized) (M62.81)                Time: 8669-8644 OT Time Calculation (min): 25 min Charges:  OT General Charges $OT Visit: 1 Visit OT Evaluation $OT Eval Moderate Complexity: 1 Mod  Elston Slot, M.S. OTR/L  02/03/24, 3:40 PM  ascom [phone]

## 2024-02-03 NOTE — Plan of Care (Signed)
  Problem: Clinical Measurements: Goal: Respiratory complications will improve Outcome: Progressing   Problem: Nutrition: Goal: Adequate nutrition will be maintained Outcome: Progressing   Problem: Elimination: Goal: Will not experience complications related to bowel motility Outcome: Progressing   Problem: Elimination: Goal: Will not experience complications related to urinary retention Outcome: Progressing   Problem: Pain Managment: Goal: General experience of comfort will improve and/or be controlled Outcome: Progressing   Problem: Safety: Goal: Ability to remain free from injury will improve Outcome: Progressing

## 2024-02-04 DIAGNOSIS — K264 Chronic or unspecified duodenal ulcer with hemorrhage: Secondary | ICD-10-CM | POA: Diagnosis not present

## 2024-02-04 LAB — BASIC METABOLIC PANEL WITH GFR
Anion gap: 4 — ABNORMAL LOW (ref 5–15)
BUN: 13 mg/dL (ref 6–20)
CO2: 28 mmol/L (ref 22–32)
Calcium: 7.9 mg/dL — ABNORMAL LOW (ref 8.9–10.3)
Chloride: 103 mmol/L (ref 98–111)
Creatinine, Ser: 0.75 mg/dL (ref 0.61–1.24)
GFR, Estimated: >60 mL/min (ref >60)
Glucose, Bld: 143 mg/dL — ABNORMAL HIGH (ref 70–99)
Potassium: 4.2 mmol/L (ref 3.5–5.1)
Sodium: 134 mmol/L — ABNORMAL LOW (ref 135–145)

## 2024-02-04 LAB — CBC
HCT: 24.3 % — ABNORMAL LOW (ref 39.0–52.0)
Hemoglobin: 7.8 g/dL — ABNORMAL LOW (ref 13.0–17.0)
MCH: 26.4 pg (ref 26.0–34.0)
MCHC: 32.1 g/dL (ref 30.0–36.0)
MCV: 82.1 fL (ref 80.0–100.0)
Platelets: 196 K/uL (ref 150–400)
RBC: 2.96 MIL/uL — ABNORMAL LOW (ref 4.22–5.81)
RDW: 19.1 % — ABNORMAL HIGH (ref 11.5–15.5)
WBC: 5.2 K/uL (ref 4.0–10.5)
nRBC: 0 % (ref 0.0–0.2)

## 2024-02-04 LAB — GLUCOSE, CAPILLARY
Glucose-Capillary: 159 mg/dL — ABNORMAL HIGH (ref 70–99)
Glucose-Capillary: 97 mg/dL (ref 70–99)
Glucose-Capillary: 170 mg/dL — ABNORMAL HIGH (ref 70–99)
Glucose-Capillary: 131 mg/dL — ABNORMAL HIGH (ref 70–99)
Glucose-Capillary: 118 mg/dL — ABNORMAL HIGH (ref 70–99)
Glucose-Capillary: 109 mg/dL — ABNORMAL HIGH (ref 70–99)

## 2024-02-04 MED ORDER — OXYCODONE-ACETAMINOPHEN 5-325 MG PO TABS
1.0000 | ORAL_TABLET | ORAL | Status: DC | PRN
  Administered 2024-02-05 (×2): 1 via ORAL
  Filled 2024-02-04 (×2): qty 1

## 2024-02-04 MED ORDER — PANTOPRAZOLE SODIUM 40 MG PO TBEC
40.0000 mg | DELAYED_RELEASE_TABLET | Freq: Two times a day (BID) | ORAL | Status: DC
  Administered 2024-02-04 – 2024-02-09 (×10): 40 mg via ORAL
  Filled 2024-02-04 (×10): qty 1

## 2024-02-04 MED ORDER — INSULIN ASPART 100 UNIT/ML IJ SOLN
0.0000 [IU] | Freq: Three times a day (TID) | INTRAMUSCULAR | Status: DC
  Administered 2024-02-05 (×3): 4 [IU] via SUBCUTANEOUS
  Administered 2024-02-06: 3 [IU] via SUBCUTANEOUS
  Administered 2024-02-06: 4 [IU] via SUBCUTANEOUS
  Administered 2024-02-06: 3 [IU] via SUBCUTANEOUS
  Administered 2024-02-07 (×2): 4 [IU] via SUBCUTANEOUS
  Administered 2024-02-08: 3 [IU] via SUBCUTANEOUS
  Administered 2024-02-08 – 2024-02-09 (×2): 7 [IU] via SUBCUTANEOUS
  Administered 2024-02-09: 3 [IU] via SUBCUTANEOUS
  Filled 2024-02-04: qty 3
  Filled 2024-02-04: qty 7
  Filled 2024-02-04: qty 4
  Filled 2024-02-04: qty 3
  Filled 2024-02-04 (×3): qty 4
  Filled 2024-02-04: qty 3
  Filled 2024-02-04: qty 2
  Filled 2024-02-04: qty 4
  Filled 2024-02-04: qty 7
  Filled 2024-02-04: qty 3
  Filled 2024-02-04: qty 4

## 2024-02-04 MED ORDER — PREGABALIN 75 MG PO CAPS
200.0000 mg | ORAL_CAPSULE | Freq: Three times a day (TID) | ORAL | Status: DC
  Administered 2024-02-04 – 2024-02-09 (×14): 200 mg via ORAL
  Filled 2024-02-04 (×14): qty 1

## 2024-02-04 MED ORDER — BACLOFEN 10 MG PO TABS
15.0000 mg | ORAL_TABLET | Freq: Three times a day (TID) | ORAL | Status: DC
  Administered 2024-02-04 – 2024-02-09 (×13): 15 mg via ORAL
  Filled 2024-02-04 (×14): qty 2

## 2024-02-04 NOTE — Progress Notes (Signed)
 Patient ID: AVRAJ LINDROTH, male   DOB: Aug 23, 1965, 58 y.o.   MRN: [id number] [facility]    SUBJECTIVE: Patient states he feels reasonably well has improved appetite and would like to advance his diet his fingers seems to be improving slightly but still has signs of infection on medical therapy.  Denies any any fever chills or sweats   Vitals:   02/03/24 2151 02/04/24 0000 02/04/24 0346 02/04/24 0833  BP: 113/64  (!) 105/58 130/61  Pulse: 61  62 62  Resp: 20  18 16   Temp: 98.2 F (36.8 C)  97.7 F (36.5 C) 99.3 F (37.4 C)  TempSrc: Oral   Oral  SpO2: 97%  100% 99%  Weight:  126.2 kg    Height:  5' 11 (1.803 m)       Intake/Output Summary (Last 24 hours) at 02/04/2024 1307 Last data filed at 02/04/2024 1037 Gross per 24 hour  Intake 1366.74 ml  Output 1050 ml  Net 316.74 ml      PHYSICAL EXAM  General: Well developed, well nourished, in no acute distress HEENT:  Normocephalic and atramatic Neck:  No JVD.  Lungs: Clear bilaterally to auscultation and percussion. Heart: Irregular irregular. Normal S1 and S2 without gallops or murmurs.  Abdomen: Bowel sounds are positive, abdomen soft and non-tender  Msk:  Back normal, normal gait. Normal strength and tone for age. Extremities: No clubbing, cyanosis or edema.  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 Psych:  Good affect, responds appropriately   LABS: Basic Metabolic Panel: Recent Labs    02/02/24 0504 02/03/24 0425 02/04/24 0402  NA 138 135 134*  K 3.3* 4.0 4.2  CL 106 104 103  CO2 25 27 28   GLUCOSE 125* 145* 143*  BUN 20 14 13   CREATININE 0.67 0.60* 0.75  CALCIUM  7.9* 8.0* 7.9*  MG 2.1 2.1  --   PHOS 3.1  --   --    Liver Function Tests: No results for input(s): AST, ALT, ALKPHOS, BILITOT, PROT, ALBUMIN  in the last 72 hours. No results for input(s): LIPASE, AMYLASE in the last 72 hours. CBC: Recent Labs     02/03/24 0425 02/04/24 0402  WBC 5.7 5.2  HGB 8.0* 7.8*  HCT 24.2* 24.3*  MCV 80.1 82.1  PLT 178 196   Cardiac Enzymes: No results for input(s): CKTOTAL, CKMB, CKMBINDEX, TROPONINI in the last 72 hours. BNP: Invalid input(s): POCBNP D-Dimer: No results for input(s): DDIMER in the last 72 hours. Hemoglobin A1C: No results for input(s): HGBA1C in the last 72 hours. Fasting Lipid Panel: No results for input(s): CHOL, HDL, LDLCALC, TRIG, CHOLHDL, LDLDIRECT in the last 72 hours. Thyroid  Function Tests: No results for input(s): TSH, T4TOTAL, T3FREE, THYROIDAB in the last 72 hours.  Invalid input(s): FREET3 Anemia Panel: No results for input(s): VITAMINB12, FOLATE, FERRITIN, TIBC, IRON , RETICCTPCT in the last 72 hours.  DG Hand Complete Right Result Date: 02/02/2024 EXAM: 3 or more VIEW(S) XRAY OF THE RIGHT HAND 02/02/2024 03:11:00 PM COMPARISON: None available. CLINICAL HISTORY: Wound dehiscence FINDINGS: BONES AND JOINTS: Osteolytic changes of the distal phalanx of the right index finger. SOFT TISSUES: Soft tissue swelling and dorsal and anterior ulceration associated with the right index finger. Soft tissue ulceration at the tip of the right middle finger. IMPRESSION: 1. Osteolytic changes of the distal phalanx of the right index finger with associated soft tissue swelling and dorsal and anterior ulceration. Findings consistent with osteomyelitis. 2.  Soft tissue ulceration at the tip of the right middle finger without underlying osteolytic change. Electronically signed by: Franky Stanford MD 02/02/2024 08:14 PM EST RP Workstation: [id number]     Echo moderate to severely depressed left ventricular function globally EF around 30 to 35%  TELEMETRY: Atrial fibrillation rate improved narrow complex nonspecific ST-T changes:  ASSESSMENT AND PLAN:  Principal Problem:   GI bleed Active Problems:   Acute blood loss anemia   Upper GI bleeding    Melena   Hematochezia   DU (duodenal ulcer)   Blood in stool   Duodenal ulcer   Blood loss anemia   Bipolar disorder (HCC)    Plan Continue broad-spectrum antibiotic therapy for finger osteomyelitis therapy agree with ID input Paroxysmal atrial fibrillation poor anticoagulation candidate because of GI bleeding in the past continue conservative therapy Morbid obesity recommend modest weight loss portion control CHF in the past continue GDMT Anemia hemoglobin around 8 continue to transfuse to 8 or 9 Probable obstructive sleep apnea recommend sleep study CPAP with weight loss GERD symptoms recommend aggressive PPI therapy HFrEF EF around 30% continue heart failure medical therapy Hyperlipidemia agree with statin therapy for lipid management Continue diuretic therapy for heart failure symptoms and volume congestion No invasive cardiac procedures recommended Continue to avoid anticoagulation because of high risk of bleeding.  Because of infection unlikely to benefit from [place].  Cara JONETTA Lovelace, MD, 02/04/2024 1:07 PM

## 2024-02-04 NOTE — Progress Notes (Signed)
 PROGRESS NOTE    Russell [name]  FMW:[id number] DOB: 03-21-1965 DOA: 01/30/2024 PCP: Eliverto [name], MD  219A/219A-AA  LOS: 5 days   Brief hospital course:   Assessment & Plan: Russell [name] is a 58 year old male with a medical history significant for peripheral vascular disease s/p right sided BKA, left sided AKA, left sided forearm amputation, chronic HFrEF with recovered LVEF, Type II Diabetes Mellitus, BPH, hypertension, hyperlipidemia and a GI bleed secondary to peptic ulcer, who presented to [facility] ED with concern for black tarry stool. At time of arrival he had complaints of tailbone pain, reporting he has been taking [employer] powder for 3 days. Upon arrival he was hemodynamically stable with a hemoglobin was 6.0.   01/31/24: H&H not improving after 2 units pRBCs, pending repeat after 4th unit pRBCs. Developed afib rvr. Placed on amiodarone  gtt. Later developed hypotension requiring levophed  gtt. transferred to the ICU.  Off levophed  on 12/4.  Transferred to [facility] on 12/5.   #Hypovolemic Shock s/t acute GIB --off pressor.  #Acute Metabolic Encephalopathy- resolved #Bipolar Disorder  #Chronic Pain --d/c'ed IV dilaudid  --change from oxycodone  to Percocet and increase to q4h PRN --increase Lyrica    #Acute Hypoxic Respiratory Failure s/t Hypovolemic Shock --resolved.  Currently on RA.   #Atrial Fibrillation with RVR --cardio consulted --cont oral amio --hold anticoagulation for now due to bleeding finger  Right finger osteomyelitis --Has seen ortho and may need amputation but pt hoping to preserve function.  --ID consulted, cx sent --cont zosyn  and linezolid    AKI ruled out CKD 4, ruled out CKD 2 --did not meet criteria for AKI.  GFR >60.   #Acute Metabolic Acidosis s/t Hemorrhagic Shock with Lactic Acidosis #Hyponatremia   #Acute Gastrointestinal Bleed Hx: Gastric Ulcers --cont PPI BID as oral    #Acute Blood Loss Anemia s/t GI Bleed - Trend CBC  -  Trend H&H - Hgb 7.1 - Transfuse if <7; will receive another unit pRBCs to make 5 total - Monitor for s/sx of bleeding - Hold AC for now    #Type II Diabetes Mellitus --A1c 9.5 --cont glargine 30u nightly --ACHS and SSI  #DKA ~ Resolved  #Leukocytosis likely reactive in setting of Shock - Trend WBC and monitor fever curve - Blood cultures pending - Hold off on abx for now   DVT prophylaxis: Lovenox  SQ Code Status: DNR  Family Communication:  Level of care: Med-Surg Dispo:   The patient is from: home Anticipated d/c is to: SNF Anticipated d/c date is: 2-3 days   Subjective and Interval History:  Pt reported his tailbone hurt, and he had phantom pain in his amputation sites.    Objective: Vitals:   02/04/24 0000 02/04/24 0346 02/04/24 0833 02/04/24 1643  BP:  (!) 105/58 130/61 105/60  Pulse:  62 62 60  Resp:  18 16 14   Temp:  97.7 F (36.5 C) 99.3 F (37.4 C) 97.7 F (36.5 C)  TempSrc:   Oral   SpO2:  100% 99% 97%  Weight: 126.2 kg     Height: 5' 11 (1.803 m)       Intake/Output Summary (Last 24 hours) at 02/04/2024 1930 Last data filed at 02/04/2024 1300 Gross per 24 hour  Intake 1606.74 ml  Output 1050 ml  Net 556.74 ml   Filed Weights   01/30/24 0813 02/04/24 0000  Weight: 123.8 kg 126.2 kg    Examination:   Constitutional: NAD, AAOx3 HEENT: conjunctivae and lids normal, EOMI CV: No  cyanosis.   RESP: normal respiratory effort, on RA Neuro: II - XII grossly intact.   Psych: Normal mood and affect.  Appropriate judgement and reason  Foley present   Data Reviewed: I have personally reviewed labs and imaging studies  Time spent: 50 minutes  Ellouise Haber, MD [facility] If 7PM-7AM, please contact night-coverage 02/04/2024, 7:30 PM

## 2024-02-04 NOTE — Plan of Care (Signed)
  Problem: Nutritional: Goal: Maintenance of adequate nutrition will improve Outcome: Progressing   Problem: Metabolic: Goal: Ability to maintain appropriate glucose levels will improve Outcome: Progressing   Problem: Clinical Measurements: Goal: Ability to maintain clinical measurements within normal limits will improve Outcome: Progressing   Problem: Elimination: Goal: Will not experience complications related to bowel motility Outcome: Progressing   Problem: Pain Managment: Goal: General experience of comfort will improve and/or be controlled Outcome: Progressing   Problem: Safety: Goal: Ability to remain free from injury will improve Outcome: Progressing

## 2024-02-05 DIAGNOSIS — K264 Chronic or unspecified duodenal ulcer with hemorrhage: Secondary | ICD-10-CM | POA: Diagnosis not present

## 2024-02-05 LAB — GLUCOSE, CAPILLARY
Glucose-Capillary: 151 mg/dL — ABNORMAL HIGH (ref 70–99)
Glucose-Capillary: 152 mg/dL — ABNORMAL HIGH (ref 70–99)
Glucose-Capillary: 154 mg/dL — ABNORMAL HIGH (ref 70–99)
Glucose-Capillary: 182 mg/dL — ABNORMAL HIGH (ref 70–99)

## 2024-02-05 LAB — CULTURE, BLOOD (ROUTINE X 2): Culture: NO GROWTH

## 2024-02-05 NOTE — Progress Notes (Signed)
 PROGRESS NOTE    Russell [name]  FMW:[id number] DOB: 10/20/1965 DOA: 01/30/2024 PCP: Russell [name], MD  219A/219A-AA  LOS: 6 days   Brief hospital course:   Assessment & Plan: Russell [name] is a 58 year old male with a medical history significant for peripheral vascular disease s/p right sided BKA, left sided AKA, left sided forearm amputation, chronic HFrEF with recovered LVEF, Type II Diabetes Mellitus, BPH, hypertension, hyperlipidemia and a GI bleed secondary to peptic ulcer, who presented to [facility] ED with concern for black tarry stool. At time of arrival he had complaints of tailbone pain, reporting he has been taking [employer] powder for 3 days. Upon arrival he was hemodynamically stable with a hemoglobin was 6.0.   01/31/24: H&H not improving after 2 units pRBCs, pending repeat after 4th unit pRBCs. Developed afib rvr. Placed on amiodarone  gtt. Later developed hypotension requiring levophed  gtt. transferred to the ICU.  Off levophed  on 12/4.  Transferred to [facility] on 12/5.   #Hypovolemic Shock s/t acute GIB --off pressor.  #Acute Metabolic Encephalopathy- resolved #Bipolar Disorder  #Chronic Pain --d/c'ed IV dilaudid  --cont Percocet PRN --cont increased Lyrica    #Acute Hypoxic Respiratory Failure s/t Hypovolemic Shock --resolved.  Currently on RA.   #Atrial Fibrillation with RVR --cardio consulted --cont oral amio --hold anticoagulation for now due to bleeding finger  Right finger osteomyelitis --Has seen ortho and may need amputation but pt hoping to preserve function.  --ID consulted, cx sent --cont zosyn  and Linezolid    AKI ruled out CKD 4, ruled out CKD 2 --did not meet criteria for AKI.  GFR >60.   #Acute Metabolic Acidosis s/t Hemorrhagic Shock with Lactic Acidosis #Hyponatremia   #Acute Gastrointestinal Bleed Hx: Gastric Ulcers --cont oral PPI BID   #Acute Blood Loss Anemia s/t GI Bleed - Trend CBC  - Trend H&H - Hgb 7.1 - Transfuse if <7;  will receive another unit pRBCs to make 5 total - Monitor for s/sx of bleeding - Hold AC for now    #Type II Diabetes Mellitus --A1c 9.5 --cont glargine 30u nightly --ACHS and SSI --need to stay on carb consistent diet  #DKA ~ Resolved  #Leukocytosis likely reactive in setting of Shock - Trend WBC and monitor fever curve - Blood cultures pending - Hold off on abx for now   DVT prophylaxis: Lovenox  SQ Code Status: DNR  Family Communication:  Level of care: Med-Surg Dispo:   The patient is from: home Anticipated d/c is to: SNF Anticipated d/c date is: 2-3 days   Subjective and Interval History:  Pt reported pain controlled with current regimen, though pt said he fell asleep after receiving pain med.   Objective: Vitals:   02/05/24 0739 02/05/24 0839 02/05/24 1616 02/05/24 2027  BP: (!) 105/57 (!) 110/53 111/69 114/68  Pulse: (!) 59 61 67 64  Resp: 15 18 18 18   Temp: 98.2 F (36.8 C) 97.9 F (36.6 C)  98.1 F (36.7 C)  TempSrc: Oral Oral    SpO2: 99% 97% 94% 100%  Weight:      Height:        Intake/Output Summary (Last 24 hours) at 02/05/2024 2052 Last data filed at 02/05/2024 1859 Gross per 24 hour  Intake 1437.52 ml  Output 1050 ml  Net 387.52 ml   Filed Weights   01/30/24 0813 02/04/24 0000  Weight: 123.8 kg 126.2 kg    Examination:   Constitutional: NAD, AAOx3 HEENT: conjunctivae and lids normal, EOMI CV: No cyanosis.  RESP: normal respiratory effort, on RA Neuro: II - XII grossly intact.   Psych: Normal mood and affect.  Appropriate judgement and reason  Foley present   Data Reviewed: I have personally reviewed labs and imaging studies  Time spent: 35 minutes  Russell Haber, MD [facility] If 7PM-7AM, please contact night-coverage 02/05/2024, 8:52 PM

## 2024-02-05 NOTE — Progress Notes (Signed)
 Patient ID: Russell [name], male   DOB: [birth year], 58 y.o.   MRN: [id number] [facility]    SUBJECTIVE: Patient resting comfortably denies any pain sitting in the chair earlier this morning no fever chills or sweats he is concerned about advancing his diet he would like to be able to eat more and had no more relaxed diet   Vitals:   02/04/24 2108 02/05/24 0420 02/05/24 0739 02/05/24 0839  BP: (!) 97/50 (!) 101/49 (!) 105/57 (!) 110/53  Pulse: (!) 58 (!) 58 (!) 59 61  Resp:   15 18  Temp: 97.7 F (36.5 C) 97.7 F (36.5 C) 98.2 F (36.8 C) 97.9 F (36.6 C)  TempSrc: Oral Oral Oral Oral  SpO2: 93% 100% 99% 97%  Weight:      Height:         Intake/Output Summary (Last 24 hours) at 02/05/2024 1353 Last data filed at 02/05/2024 0900 Gross per 24 hour  Intake 475.24 ml  Output 1050 ml  Net -574.76 ml      PHYSICAL EXAM  General: Well developed, well nourished, in no acute distress HEENT:  Normocephalic and atramatic Neck:  No JVD.  Lungs: Clear bilaterally to auscultation and percussion. Heart: HRRR . Normal S1 and S2 without gallops or murmurs.  Abdomen: Bowel sounds are positive, abdomen soft and non-tender  Msk:  Back normal, normal gait. Normal strength and tone for age. Extremities: No clubbing, cyanosis or edema.  Bilateral AKA left forearm amputation significant osteomyelitis to fingers of the right hand Neuro: Alert and oriented X 3. Psych:  Good affect, responds appropriately   LABS: Basic Metabolic Panel: Recent Labs    02/03/24 0425 02/04/24 0402  NA 135 134*  K 4.0 4.2  CL 104 103  CO2 27 28  GLUCOSE 145* 143*  BUN 14 13  CREATININE 0.60* 0.75  CALCIUM  8.0* 7.9*  MG 2.1  --    Liver Function Tests: No results for input(s): AST, ALT, ALKPHOS, BILITOT, PROT, ALBUMIN  in the last 72 hours. No results for input(s): LIPASE, AMYLASE in the last 72 hours. CBC: Recent Labs    02/03/24 0425 02/04/24 0402  WBC 5.7 5.2  HGB 8.0* 7.8*   HCT 24.2* 24.3*  MCV 80.1 82.1  PLT 178 196   Cardiac Enzymes: No results for input(s): CKTOTAL, CKMB, CKMBINDEX, TROPONINI in the last 72 hours. BNP: Invalid input(s): POCBNP D-Dimer: No results for input(s): DDIMER in the last 72 hours. Hemoglobin A1C: No results for input(s): HGBA1C in the last 72 hours. Fasting Lipid Panel: No results for input(s): CHOL, HDL, LDLCALC, TRIG, CHOLHDL, LDLDIRECT in the last 72 hours. Thyroid  Function Tests: No results for input(s): TSH, T4TOTAL, T3FREE, THYROIDAB in the last 72 hours.  Invalid input(s): FREET3 Anemia Panel: No results for input(s): VITAMINB12, FOLATE, FERRITIN, TIBC, IRON , RETICCTPCT in the last 72 hours.  No results found.   Echo preserved left ventricular function EF of 55% grade 2 diastolic dysfunction  TELEMETRY: Atrial fibrillation rate controlled at around 100:  ASSESSMENT AND PLAN:  Principal Problem:   GI bleed Active Problems:   Acute blood loss anemia   Upper GI bleeding   Melena   Hematochezia   DU (duodenal ulcer)   Blood in stool   Duodenal ulcer   Blood loss anemia   Bipolar disorder (HCC)   Plan Osteomyelitis agree with ID continue broad-spectrum antibiotic therapy Acute metabolic encephalopathy improved continue current management Hypovolemic shock continue maintain hemoglobin above 7 transfuse as necessary GI  bleed improved no further bleeding continue PPI therapy Acute hypoxic respiratory failure resolved continue current therapy no longer on oxygen Atrial fibrillation rate controlled poor anticoagulation candidate because of high risk of bleeding GI ulcers Obesity weight loss exercise portion control Diabetes type 2 continue diabetes management A1c goal less than 7 last A1c was 9.5 Bipolar disorder recommend supportive management per psychiatry assessment Continue current medical therapy   Cara JONETTA Lovelace, MD 02/05/2024 1:53 PM

## 2024-02-05 NOTE — Plan of Care (Signed)

## 2024-02-06 DIAGNOSIS — B957 Other staphylococcus as the cause of diseases classified elsewhere: Secondary | ICD-10-CM

## 2024-02-06 DIAGNOSIS — M86141 Other acute osteomyelitis, right hand: Secondary | ICD-10-CM

## 2024-02-06 DIAGNOSIS — M869 Osteomyelitis, unspecified: Secondary | ICD-10-CM

## 2024-02-06 LAB — GLUCOSE, CAPILLARY
Glucose-Capillary: 126 mg/dL — ABNORMAL HIGH (ref 70–99)
Glucose-Capillary: 152 mg/dL — ABNORMAL HIGH (ref 70–99)
Glucose-Capillary: 144 mg/dL — ABNORMAL HIGH (ref 70–99)
Glucose-Capillary: 176 mg/dL — ABNORMAL HIGH (ref 70–99)
Glucose-Capillary: 218 mg/dL — ABNORMAL HIGH (ref 70–99)
Glucose-Capillary: 180 mg/dL — ABNORMAL HIGH (ref 70–99)

## 2024-02-06 LAB — HEMOGLOBIN A1C
Hgb A1c MFr Bld: 7.2 % — ABNORMAL HIGH (ref 4.8–5.6)
Mean Plasma Glucose: 160 mg/dL

## 2024-02-06 LAB — CULTURE, BLOOD (ROUTINE X 2)
Culture: NO GROWTH
Special Requests: ADEQUATE

## 2024-02-06 MED ORDER — POLYETHYLENE GLYCOL 3350 17 G PO PACK
34.0000 g | PACK | Freq: Two times a day (BID) | ORAL | Status: DC
  Administered 2024-02-06 – 2024-02-09 (×4): 34 g via ORAL
  Filled 2024-02-06 (×6): qty 2

## 2024-02-06 MED ORDER — OXYCODONE-ACETAMINOPHEN 5-325 MG PO TABS: 1.0000 | ORAL_TABLET | Freq: Four times a day (QID) | ORAL | Status: DC | PRN

## 2024-02-06 NOTE — Progress Notes (Signed)
 Physical Therapy Treatment Patient Details Name: Russell [name] MRN: [id number] DOB: 07-08-1965 Today's Date: 02/06/2024   History of Present Illness Russell [name] is a 58 y.o. male with medical history significant of PVD status post right-sided BKA, left-sided AKA, left-sided forearm amputation, GI bleed secondary to peptic ulcer, chronic HFrEF with recovered LVEF, HTN, HLD, BPH, morbid obesity, presented with black tarry stool.    PT Comments  Pt seen for PT tx with pt agreeable. Pt is able to transition to sitting EOB with max assist, hospital bed features. Pt completes lateral scoot bed>recliner with supervision. Pt noted to have incontinent BM, requires total assist for peri hygiene. Recommend ongoing PT services to progress mobility as able. At this time, pt is far from baseline, recommend post acute rehab <3 hours therapy/day upon d/c.    If plan is discharge home, recommend the following: A lot of help with walking and/or transfers;A lot of help with bathing/dressing/bathroom;Assistance with cooking/housework;Assist for transportation;Help with stairs or ramp for entrance   Can travel by private vehicle     No  Equipment Recommendations  Other (comment) (defer to next venue)    Recommendations for Other Services       Precautions / Restrictions Precautions Precautions: Fall Restrictions Weight Bearing Restrictions Per Provider Order: No     Mobility  Bed Mobility Overal bed mobility: Needs Assistance Bed Mobility: Supine to Sit Rolling: Min assist   Supine to sit: Max assist, HOB elevated, Used rails (exit R side of bed, HOB elevated, assistance to upright trunk)          Transfers Overall transfer level: Needs assistance                Lateral/Scoot Transfers: Supervision General transfer comment: extra time, lateral scoot from bed>drop arm recliner on R with extra time PRN    Ambulation/Gait                   Stairs              Wheelchair Mobility     Tilt Bed    Modified Rankin (Stroke Patients Only)       Balance Overall balance assessment: Needs assistance Sitting-balance support: Feet unsupported, Single extremity supported Sitting balance-Leahy Scale: Fair                                      Hotel Manager: No apparent difficulties  Cognition Arousal: Alert Behavior During Therapy: WFL for tasks assessed/performed   PT - Cognitive impairments: No apparent impairments                         Following commands: Intact      Cueing Cueing Techniques: Verbal cues  Exercises      General Comments        Pertinent Vitals/Pain Pain Assessment Pain Assessment: Faces Faces Pain Scale: Hurts whole lot Pain Location: stomach, buttocks Pain Descriptors / Indicators: Discomfort, Grimacing, Guarding Pain Intervention(s): Monitored during session, Limited activity within patient's tolerance, Repositioned, Relaxation    Home Living                          Prior Function            PT Goals (current goals can now be found in the care plan section) Acute Rehab  PT Goals Patient Stated Goal: to improve his strength PT Goal Formulation: With patient Time For Goal Achievement: 02/17/24 Potential to Achieve Goals: Good Progress towards PT goals: Progressing toward goals    Frequency    Min 2X/week      PT Plan      Co-evaluation              AM-PAC PT 6 Clicks Mobility   Outcome Measure  Help needed turning from your back to your side while in a flat bed without using bedrails?: A Lot Help needed moving from lying on your back to sitting on the side of a flat bed without using bedrails?: A Lot Help needed moving to and from a bed to a chair (including a wheelchair)?: A Lot Help needed standing up from a chair using your arms (e.g., wheelchair or bedside chair)?: Total Help needed to walk in hospital  room?: Total Help needed climbing 3-5 steps with a railing? : Total 6 Click Score: 9    End of Session   Activity Tolerance: Patient tolerated treatment well Patient left: in chair;with call bell/phone within reach Nurse Communication: Mobility status (dark/black BM, c/o pain) PT Visit Diagnosis: Other abnormalities of gait and mobility (R26.89);Muscle weakness (generalized) (M62.81);History of falling (Z91.81);Pain Pain - part of body:  (stomach)     Time: 8569-8497 PT Time Calculation (min) (ACUTE ONLY): 32 min  Charges:    $Therapeutic Activity: 23-37 mins PT General Charges $$ ACUTE PT VISIT: 1 Visit                     Richerd Pinal, PT, DPT 02/06/24, 3:17 PM  Richerd CHRISTELLA Pinal 02/06/2024, 3:16 PM

## 2024-02-06 NOTE — Plan of Care (Signed)

## 2024-02-06 NOTE — TOC Progression Note (Signed)
 Transition of Care [facility]) - Progression Note    Patient Details  Name: MAYKEL REITTER MRN: [id number] Date of Birth: 02-09-1966  [facility]) CM/SW Contact  [name], RN Phone Number: 02/06/2024, 1:47 PM  Clinical Narrative:    No bed offers Search extended [facility] listed as considering.  Call placed to [facility] with [facility], he is to review and call me back      Barriers to Discharge: Continued Medical Work up               Expected Discharge Plan and Services                                               Social Drivers of Health (SDOH) Interventions SDOH Screenings   Food Insecurity: Food Insecurity Present (02/02/2024)  Housing: High Risk (02/02/2024)  Transportation Needs: Unmet Transportation Needs (02/02/2024)  Utilities: Not At Risk (02/02/2024)  Financial Resource Strain: Low Risk  (04/04/2023)   Received from [facility]  Tobacco Use: Low Risk  (02/02/2024)    Readmission Risk Interventions    03/03/2023   12:27 PM 10/25/2022    3:55 PM  Readmission Risk Prevention Plan  Transportation Screening Complete Complete  PCP or Specialist Appt within 3-5 Days  Complete  HRI or Home Care Consult  Complete  Social Work Consult for Recovery Care Planning/Counseling  Complete  Palliative Care Screening  Not Applicable  Medication Review Oceanographer) Complete Complete  PCP or Specialist appointment within 3-5 days of discharge Complete   HRI or Home Care Consult Complete   Palliative Care Screening Not Applicable   Skilled Nursing Facility Not Applicable

## 2024-02-06 NOTE — Progress Notes (Signed)
 INFECTIOUS DISEASE PROGRESS NOTE Date of Admission:  01/30/2024     ID: Russell [name] is a 58 y.o. male with  finger osteomyelitis Principal Problem:   GI bleed Active Problems:   Acute blood loss anemia   Upper GI bleeding   Melena   Hematochezia   DU (duodenal ulcer)   Blood in stool   Duodenal ulcer   Blood loss anemia   Bipolar disorder (HCC)   Subjective: No fevers Gram stain mixed, cx staph aureus  ROS  Eleven systems are reviewed and negative except per hpi  Medications:  Antibiotics Given (last 72 hours)     Date/Time Action Medication Dose Rate   02/03/24 2230 New Bag/Given   piperacillin -tazobactam (ZOSYN ) IVPB 3.375 g 3.375 g 12.5 mL/hr   02/03/24 2253 New Bag/Given   linezolid  (ZYVOX ) IVPB 600 mg 600 mg 300 mL/hr   02/04/24 0507 New Bag/Given   piperacillin -tazobactam (ZOSYN ) IVPB 3.375 g 3.375 g 12.5 mL/hr   02/04/24 0932 New Bag/Given   linezolid  (ZYVOX ) IVPB 600 mg 600 mg 300 mL/hr   02/04/24 1313 New Bag/Given   piperacillin -tazobactam (ZOSYN ) IVPB 3.375 g 3.375 g 12.5 mL/hr   02/04/24 2150 New Bag/Given   linezolid  (ZYVOX ) IVPB 600 mg 600 mg 300 mL/hr   02/04/24 2150 New Bag/Given   piperacillin -tazobactam (ZOSYN ) IVPB 3.375 g 3.375 g 12.5 mL/hr   02/05/24 0529 New Bag/Given   piperacillin -tazobactam (ZOSYN ) IVPB 3.375 g 3.375 g 12.5 mL/hr   02/05/24 9093 New Bag/Given   linezolid  (ZYVOX ) IVPB 600 mg 600 mg 300 mL/hr   02/05/24 1230 New Bag/Given   piperacillin -tazobactam (ZOSYN ) IVPB 3.375 g 3.375 g 12.5 mL/hr   02/05/24 2239 New Bag/Given   linezolid  (ZYVOX ) IVPB 600 mg 600 mg 300 mL/hr   02/05/24 2244 New Bag/Given   piperacillin -tazobactam (ZOSYN ) IVPB 3.375 g 3.375 g 12.5 mL/hr   02/06/24 0610 New Bag/Given   piperacillin -tazobactam (ZOSYN ) IVPB 3.375 g 3.375 g 12.5 mL/hr   02/06/24 0853 New Bag/Given   linezolid  (ZYVOX ) IVPB 600 mg 600 mg 300 mL/hr   02/06/24 1423 New Bag/Given   piperacillin -tazobactam (ZOSYN ) IVPB 3.375 g 3.375 g  12.5 mL/hr       sodium chloride    Intravenous Once   amiodarone   400 mg Oral BID   Followed by   NOREEN ON 02/09/2024] amiodarone   200 mg Oral Daily   atorvastatin   40 mg Oral Daily   baclofen   15 mg Oral TID   Chlorhexidine  Gluconate Cloth  6 each Topical Daily   DULoxetine   90 mg Oral Daily   enoxaparin  (LOVENOX ) injection  60 mg Subcutaneous Q24H   finasteride   5 mg Oral Daily   insulin  aspart  0-20 Units Subcutaneous TID WC   insulin  glargine-yfgn  30 Units Subcutaneous QHS   melatonin  5 mg Oral QHS   pantoprazole   40 mg Oral BID   polyethylene glycol  34 g Oral BID   pregabalin   200 mg Oral TID   tamsulosin   0.8 mg Oral Daily    Objective: Vital signs in last 24 hours: Temp:  [98.1 F (36.7 C)-98.8 F (37.1 C)] 98.2 F (36.8 C) (12/08 0807) Pulse Rate:  [60-67] 60 (12/08 0807) Resp:  [16-18] 16 (12/08 0807) BP: (111-120)/(62-69) 120/62 (12/08 0807) SpO2:  [91 %-100 %] 98 % (12/08 0807) Physical Exam  Constitutional: He is oriented to person, place, and time. He appears well-developed and well-nourished. No distress. Neurological: He is alert and oriented to person, place, and time.  Skin:    Lab Results Recent Labs    02/04/24 0402  WBC 5.2  HGB 7.8*  HCT 24.3*  NA 134*  K 4.2  CL 103  CO2 28  BUN 13  CREATININE 0.75    Microbiology: Results for orders placed or performed during the hospital encounter of 01/30/24  Culture, blood (Routine X 2) w Reflex to ID Panel     Status: None   Collection Time: 01/31/24  7:21 PM   Specimen: BLOOD  Result Value Ref Range Status   Specimen Description BLOOD BLOOD RIGHT HAND  Final   Special Requests   Final    AEROBIC BOTTLE ONLY Blood Culture results may not be optimal due to an inadequate volume of blood received in culture bottles   Culture   Final    NO GROWTH 5 DAYS Performed at [facility], [address] [place], [place] [address]    Report Status 02/05/2024 FINAL  Final  Culture, blood  (Routine X 2) w Reflex to ID Panel     Status: None   Collection Time: 02/01/24  2:39 AM   Specimen: BLOOD  Result Value Ref Range Status   Specimen Description BLOOD LEFT ANTECUBITAL  Final   Special Requests   Final    BOTTLES DRAWN AEROBIC AND ANAEROBIC Blood Culture adequate volume   Culture   Final    NO GROWTH 5 DAYS Performed at [facility], [address], [place], [place] [address]    Report Status 02/06/2024 FINAL  Final  MRSA Next Gen by PCR, Nasal     Status: None   Collection Time: 02/02/24  5:08 PM   Specimen: Nasal Mucosa; Nasal Swab  Result Value Ref Range Status   MRSA by PCR Next Gen NOT DETECTED NOT DETECTED Final    Comment: (NOTE) The GeneXpert MRSA Assay (FDA approved for NASAL specimens only), is one component of a comprehensive MRSA colonization surveillance program. It is not intended to diagnose MRSA infection nor to guide or monitor treatment for MRSA infections. Test performance is not FDA approved in patients less than 16 years old. Performed at [facility], [address] [place], [place] [address]   Aerobic/Anaerobic Culture w Gram Stain (surgical/deep wound)     Status: None (Preliminary result)   Collection Time: 02/03/24  5:01 PM   Specimen: Joint, Finger; Wound  Result Value Ref Range Status   Specimen Description   Final    FINGER Performed at [facility], [address] [place], [place] [address]    Special Requests   Final    right finger Performed at [facility], [address] [place], [place] [address]    Gram Stain   Final    FEW WBC SEEN FEW GRAM POSITIVE COCCI RARE GRAM NEGATIVE RODS Performed at [facility], [address], [place], [place] [address]    Culture   Final    FEW STAPHYLOCOCCUS AUREUS CULTURE REINCUBATED FOR BETTER GROWTH NO ANAEROBES ISOLATED; CULTURE IN PROGRESS FOR 5 DAYS    Report Status PENDING  Incomplete      Studies/Results: No results  found.  Assessment/Plan: Russell [name] is a 58 y.o. 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 Has seen ortho and may need amputation but pt hoping to preserve function.   cx with Staph aureus gram stain was mixed  Recommendations If no pseudomonas  noted change zosyn  to unasyn . Cont linezolid  P sensis on staph aureus Should fu otpt wound care and with hand surgery per ortho recs Thank you very much for the consult. Will follow with you.  [name]   02/06/2024, 3:44 PM

## 2024-02-06 NOTE — Progress Notes (Signed)
 PROGRESS NOTE    Russell [name]  FMW:[id number] DOB: 02-Feb-1966 DOA: 01/30/2024 PCP: Eliverto [name], MD  [id number]  LOS: 7 days   Brief hospital course:   Assessment & Plan: Russell [name] is a 58 year old male with a medical history significant for peripheral vascular disease s/p right sided BKA, left sided AKA, left sided forearm amputation, chronic HFrEF with recovered LVEF, Type II Diabetes Mellitus, BPH, hypertension, hyperlipidemia and a GI bleed secondary to peptic ulcer, who presented to [facility] with concern for black tarry stool. At time of arrival he had complaints of tailbone pain, reporting he has been taking [employer] Goodies powder for 3 days. Upon arrival he was hemodynamically stable with a hemoglobin was 6.0.   01/31/24: H&H not improving after 2 units pRBCs, pending repeat after 4th unit pRBCs. Developed afib rvr. Placed on amiodarone  gtt. Later developed hypotension requiring levophed  gtt. transferred to the ICU.  Off levophed  on 12/4.  Transferred to [facility] on 12/5.   #Hypovolemic Shock s/t acute GIB --off pressor.  #Acute Metabolic Encephalopathy- resolved #Bipolar Disorder  #Chronic Pain --d/c'ed IV dilaudid  --cont Percocet PRN --cont increased Lyrica    #Acute Hypoxic Respiratory Failure s/t Hypovolemic Shock --resolved.  Currently on RA.   #Atrial Fibrillation with RVR --cardio consulted --cont oral amio --hold anticoagulation for now due to bleeding finger  Right finger osteomyelitis --Has seen ortho and may need amputation but pt hoping to preserve function.  --ID consulted, cx sent --cont zosyn  and Linezolid    AKI ruled out CKD 4, ruled out CKD 2 --did not meet criteria for AKI.  GFR >60.   #Acute Metabolic Acidosis s/t Hemorrhagic Shock with Lactic Acidosis #Hyponatremia   #Acute Gastrointestinal Bleed Hx: Gastric Ulcers --cont oral PPI BID   #Acute Blood Loss Anemia s/t GI Bleed - Trend CBC  - Trend H&H - Hgb 7.1 - Transfuse if <7;  will receive another unit pRBCs to make 5 total - Monitor for s/sx of bleeding - Hold AC for now    #Type II Diabetes Mellitus --A1c 9.5 --cont glargine 30u nightly --ACHS and SSI --need to stay on carb consistent diet  #DKA ~ Resolved  #Leukocytosis likely reactive in setting of Shock - Trend WBC and monitor fever curve - Blood cultures pending - Hold off on abx for now   DVT prophylaxis: Lovenox  SQ Code Status: DNR  Family Communication:  Level of care: Med-Surg Dispo:   The patient is from: home Anticipated d/c is to: SNF Anticipated d/c date is: 2-3 days   Subjective and Interval History:  Pt had no complaint today   Objective: Vitals:   02/06/24 0324 02/06/24 0807 02/06/24 1504 02/06/24 1551  BP: 116/63 120/62 (!) 118/55   Pulse: 61 60  67  Resp: 16 16  16   Temp: 98.8 F (37.1 C) 98.2 F (36.8 C) 98.1 F (36.7 C)   TempSrc:  Oral  Oral  SpO2: 91% 98%  100%  Weight:      Height:        Intake/Output Summary (Last 24 hours) at 02/06/2024 1944 Last data filed at 02/06/2024 1900 Gross per 24 hour  Intake 960 ml  Output 2000 ml  Net -1040 ml   Filed Weights   01/30/24 0813 02/04/24 0000  Weight: 123.8 kg 126.2 kg    Examination:   Constitutional: NAD, AAOx3 HEENT: conjunctivae and lids normal, EOMI CV: No cyanosis.   RESP: normal respiratory effort, on RA Neuro: II - XII grossly intact.  Psych: Normal mood and affect.  Appropriate judgement and reason  Foley present   Data Reviewed: I have personally reviewed labs and imaging studies  Time spent: 25 minutes  Ellouise Haber, MD [facility] If 7PM-7AM, please contact night-coverage 02/06/2024, 7:44 PM

## 2024-02-06 NOTE — Progress Notes (Signed)
 [name] CLINIC CARDIOLOGY PROGRESS NOTE       Patient ID: Russell [name] MRN: [id number] DOB/AGE: [birth year] 58 y.o.  Admit date: 01/30/2024 Referring Physician Dr. Laneta Blunt Primary Physician Olmedo, [name], MD  Primary Cardiologist [name] (previously seen by [name], [name]) Reason for Consultation AF RVR  HPI: Russell [name] is a 58 y.o. male  with a past medical history of paroxysmal atrial fibrillation not on AC, chronic diastolic heart failure, hx R BKA and L AKA, L forearm amputation, hx GIB 2/2 peptic ulcer who presented to the ED on 01/30/2024 for back pain, black tarry stool. Given transfusion overnight for significant anemia. Went into AF RVR this AM. Cardiology was consulted for further evaluation.   Interval history: -Patient seen and examined this AM, resting comfortably in hospital bed. -Feeling well today without complaints of palpitations, SOB.  -Remaining in NSR. BP stable off pressors. -Hgb 8.0 on AM labs.  Review of systems complete and found to be negative unless listed above    Past Medical History:  Diagnosis Date   Anxiety    CHF (congestive heart failure) (HCC)    EF 30-35%   Chronic diastolic (congestive) heart failure (HCC)    Depression    Diabetes mellitus without complication (HCC)    Hyperlipemia    Hypertension    Morbid obesity (HCC)    Peripheral edema     Past Surgical History:  Procedure Laterality Date   AMPUTATION Left 09/07/2023   Procedure: AMPUTATION, ABOVE KNEE;  Surgeon: Marea Selinda RAMAN, MD;  Location: [facility] ORS;  Service: General;  Laterality: Left;   APPENDECTOMY     BELOW KNEE LEG AMPUTATION     Right leg   BIOPSY  10/14/2022   Procedure: BIOPSY;  Surgeon: Aundria, Ladell POUR, MD;  Location: [facility] ENDOSCOPY;  Service: Gastroenterology;;   COLONOSCOPY N/A 10/16/2022   Procedure: COLONOSCOPY;  Surgeon: Therisa Bi, MD;  Location: [facility] ENDOSCOPY;  Service: Gastroenterology;  Laterality: N/A;   ESOPHAGOGASTRODUODENOSCOPY  N/A 02/01/2024   Procedure: EGD (ESOPHAGOGASTRODUODENOSCOPY);  Surgeon: Jinny Carmine, MD;  Location: [facility] ENDOSCOPY;  Service: Endoscopy;  Laterality: N/A;   ESOPHAGOGASTRODUODENOSCOPY (EGD) WITH PROPOFOL  N/A 10/26/2022   Procedure: ESOPHAGOGASTRODUODENOSCOPY (EGD) WITH PROPOFOL ;  Surgeon: Maryruth Ole DASEN, MD;  Location: [facility] ENDOSCOPY;  Service: Endoscopy;  Laterality: N/A;   ESOPHAGOGASTRODUODENOSCOPY (EGD) WITH PROPOFOL  N/A 10/14/2022   Procedure: ESOPHAGOGASTRODUODENOSCOPY (EGD) WITH PROPOFOL ;  Surgeon: Toledo, Ladell POUR, MD;  Location: [facility] ENDOSCOPY;  Service: Gastroenterology;  Laterality: N/A;   HEMOSTASIS CLIP PLACEMENT  02/01/2024   Procedure: CONTROL OF HEMORRHAGE, GI TRACT, ENDOSCOPIC, BY CLIPPING OR OVERSEWING;  Surgeon: Jinny Carmine, MD;  Location: [facility] ENDOSCOPY;  Service: Endoscopy;;   INCISION AND DRAINAGE ABSCESS Left 02/27/2023   Procedure: INCISION AND DRAINAGE ABSCESS LEFT FINGER AND ARM;  Surgeon: Robbin Redell Ned, MD;  Location: [facility] ORS;  Service: Orthopedics;  Laterality: Left;   TONSILLECTOMY      Medications Prior to Admission  Medication Sig Dispense Refill Last Dose/Taking   [EXPIRED] amoxicillin -clavulanate (AUGMENTIN ) 875-125 MG tablet Take 1 tablet by mouth 2 (two) times daily for 7 days. 14 tablet 0 Unknown   aspirin  EC 81 MG tablet Take 81 mg by mouth.  Take 1 tablet (81 mg total) by mouth once daily   Unknown   atorvastatin  (LIPITOR) 40 MG tablet Take 40 mg by mouth daily.    Unknown   baclofen  (LIORESAL ) 10 MG tablet Take 15 mg by mouth every 8 (eight) hours.   Unknown  cetirizine (ZYRTEC) 10 MG tablet Take 10 mg by mouth daily as needed for allergies.   Unknown   DULoxetine  (CYMBALTA ) 30 MG capsule Take 90 mg by mouth daily.   Unknown   finasteride  (PROSCAR ) 5 MG tablet Take 5 mg by mouth daily.   Unknown   furosemide  (LASIX ) 80 MG tablet Take 80 mg by mouth 2 (two) times daily.   Unknown   gabapentin  (NEURONTIN ) 600 MG tablet Take 600 mg by mouth 3  (three) times daily.   Unknown   insulin  degludec (TRESIBA) 200 UNIT/ML FlexTouch Pen Inject 20 Units into the skin at bedtime.   Unknown   insulin  glargine, 1 Unit Dial, (TOUJEO  SOLOSTAR) 300 UNIT/ML Solostar Pen Inject 20 Units into the skin at bedtime.   Unknown   insulin  lispro (HUMALOG) 100 UNIT/ML KwikPen Inject 20 Units into the skin 3 (three) times daily.   Unknown   loperamide  (IMODIUM  A-D) 2 MG tablet Take 2 mg by mouth as needed for diarrhea or loose stools.   Unknown   melatonin 5 MG TABS Take 1 tablet (5 mg total) by mouth at bedtime.   Unknown   metoprolol  succinate (TOPROL -XL) 50 MG 24 hr tablet Take 50 mg by mouth at bedtime.   Unknown   MOUNJARO 2.5 MG/0.5ML Pen Inject 2.5 mg into the skin once a week.   Unknown   MOVANTIK 25 MG TABS tablet Take 25 mg by mouth daily.   Unknown   nitrofurantoin  (MACRODANTIN ) 50 MG capsule Take 50 mg by mouth every 6 (six) hours.   Unknown   oxyCODONE  (ROXICODONE ) 15 MG immediate release tablet Take 15 mg by mouth every 6 (six) hours as needed.   Unknown   oxyCODONE  (ROXICODONE ) 15 MG immediate release tablet Take 1 tablet (15 mg total) by mouth every 6 (six) hours as needed for pain. 30 tablet 0 Unknown   potassium chloride  (MICRO-K ) 10 MEQ CR capsule Take by mouth 2 (two) times daily. :1 Tablet(s) By Mouth Twice Daily   Unknown   pregabalin  (LYRICA ) 150 MG capsule Take 150 mg by mouth 3 (three) times daily.   Unknown   senna (SENOKOT) 8.6 MG TABS tablet Take 2 tablets by mouth daily.   Unknown   senna-docusate (SENOKOT-S) 8.6-50 MG tablet Take 2 tablets by mouth 2 (two) times daily.   Unknown   tamsulosin  (FLOMAX ) 0.4 MG CAPS capsule Take 0.8 mg by mouth daily.   Unknown   acetaminophen  (TYLENOL ) 325 MG tablet Take 2 tablets (650 mg total) by mouth every 6 (six) hours as needed for mild pain (pain score 1-3) (or Fever >/= 101).   Unknown   bisacodyl  (DULCOLAX) 10 MG suppository Place 10 mg rectally daily as needed for moderate constipation.    Unknown   buPROPion  (WELLBUTRIN  XL) 150 MG 24 hr tablet Take 1 tablet (150 mg total) by mouth daily. 30 tablet 0    diclofenac Sodium (VOLTAREN) 1 % GEL Apply 2 g topically daily as needed (for pain).   Unknown   diphenhydrAMINE  (BENADRYL ) 25 mg capsule Take 1 capsule (25 mg total) by mouth every 6 (six) hours as needed for allergies or itching (30 minutes prior to starting Vancomycin ). (Patient not taking: Reported on 10/06/2023)   Unknown   HUMULIN  R 100 UNIT/ML injection Inject 0.1 mLs (10 Units total) into the skin 3 (three) times daily with meals.   Unknown   mupirocin  ointment (BACTROBAN ) 2 % Apply to penis topically as needed.   Unknown   oxymetazoline  (  AFRIN) 0.05 % nasal spray Place 1 spray into both nostrils 2 (two) times daily as needed for congestion.   Unknown   polyethylene glycol (MIRALAX  / GLYCOLAX ) 17 g packet Take 17 g by mouth daily as needed.   Unknown   Social History   Socioeconomic History   Marital status: Legally Separated    Spouse name: Not on file   Number of children: Not on file   Years of education: Not on file   Highest education level: Not on file  Occupational History   Not on file  Tobacco Use   Smoking status: Never   Smokeless tobacco: Never  Vaping Use   Vaping status: Never Used  Substance and Sexual Activity   Alcohol use: No   Drug use: No   Sexual activity: Not Currently  Other Topics Concern   Not on file  Social History Narrative   Living with mother now   Wheelchair bound at baseline   Social Drivers of Health   Financial Resource Strain: Low Risk  (04/04/2023)   Received from [facility]   Overall Financial Resource Strain (CARDIA)    Difficulty of Paying Living Expenses: Not hard at all  Food Insecurity: Food Insecurity Present (02/02/2024)   Hunger Vital Sign    Worried About Running Out of Food in the Last Year: Often true    Ran Out of Food in the Last Year: Often true  Transportation Needs: Unmet  Transportation Needs (02/02/2024)   PRAPARE - Administrator, Civil Service (Medical): Yes    Lack of Transportation (Non-Medical): Yes  Physical Activity: Not on file  Stress: Not on file  Social Connections: Not on file  Intimate Partner Violence: Not At Risk (02/02/2024)   Humiliation, Afraid, Rape, and Kick questionnaire    Fear of Current or Ex-Partner: No    Emotionally Abused: No    Physically Abused: No    Sexually Abused: No    Family History  Problem Relation Age of Onset   COPD Mother    Diabetes Father    CAD Father    Kidney cancer Brother      Vitals:   02/05/24 1616 02/05/24 2027 02/06/24 0324 02/06/24 0807  BP: 111/69 114/68 116/63 120/62  Pulse: 67 64 61 60  Resp: 18 18 16 16   Temp:  98.1 F (36.7 C) 98.8 F (37.1 C) 98.2 F (36.8 C)  TempSrc:    Oral  SpO2: 94% 100% 91% 98%  Weight:      Height:        PHYSICAL EXAM General: Chronically ill appearing male, well nourished, in no acute distress. HEENT: Normocephalic and atraumatic. Neck: No JVD.  Lungs: Normal respiratory effort on 2L [place]. Clear bilaterally to auscultation. No wheezes, crackles, rhonchi.  Heart: HRRR. Normal S1 and S2 without gallops or murmurs.  Abdomen: Non-distended appearing.  Msk: Normal strength and tone for age. Extremities: S/p L AKA and R BKA. Neuro: Alert and oriented X 3. Somnolent. Psych: Answers questions appropriately.   Labs: Basic Metabolic Panel: Recent Labs    02/04/24 0402  NA 134*  K 4.2  CL 103  CO2 28  GLUCOSE 143*  BUN 13  CREATININE 0.75  CALCIUM  7.9*   Liver Function Tests: No results for input(s): AST, ALT, ALKPHOS, BILITOT, PROT, ALBUMIN  in the last 72 hours. No results for input(s): LIPASE, AMYLASE in the last 72 hours. CBC: Recent Labs    02/04/24 0402  WBC 5.2  HGB 7.8*  HCT 24.3*  MCV 82.1  PLT 196   Cardiac Enzymes: No results for input(s): CKTOTAL, CKMB, CKMBINDEX, TROPONINIHS in the last 72  hours. BNP: No results for input(s): BNP in the last 72 hours. D-Dimer: No results for input(s): DDIMER in the last 72 hours. Hemoglobin A1C: No results for input(s): HGBA1C in the last 72 hours. Fasting Lipid Panel: No results for input(s): CHOL, HDL, LDLCALC, TRIG, CHOLHDL, LDLDIRECT in the last 72 hours. Thyroid  Function Tests: No results for input(s): TSH, T4TOTAL, T3FREE, THYROIDAB in the last 72 hours.  Invalid input(s): FREET3 Anemia Panel: No results for input(s): VITAMINB12, FOLATE, FERRITIN, TIBC, IRON , RETICCTPCT in the last 72 hours.    Radiology: DG Hand Complete Right Result Date: 02/02/2024 EXAM: 3 or more VIEW(S) XRAY OF THE RIGHT HAND 02/02/2024 03:11:00 PM COMPARISON: None available. CLINICAL HISTORY: Wound dehiscence FINDINGS: BONES AND JOINTS: Osteolytic changes of the distal phalanx of the right index finger. SOFT TISSUES: Soft tissue swelling and dorsal and anterior ulceration associated with the right index finger. Soft tissue ulceration at the tip of the right middle finger. IMPRESSION: 1. Osteolytic changes of the distal phalanx of the right index finger with associated soft tissue swelling and dorsal and anterior ulceration. Findings consistent with osteomyelitis. 2. Soft tissue ulceration at the tip of the right middle finger without underlying osteolytic change. Electronically signed by: Franky Stanford MD 02/02/2024 08:14 PM EST RP Workstation: [id number]   ECHOCARDIOGRAM COMPLETE Result Date: 02/01/2024    ECHOCARDIOGRAM REPORT   Patient Name:   MARCIO HOQUE Date of Exam: 02/01/2024 Medical Rec #:  [id number]       Height:       71.0 in Accession #:    [id number]      Weight:       273.0 lb Date of Birth:  1966-02-01        BSA:          2.407 m Patient Age:    58 years        BP:           129/59 mmHg Patient Gender: M               HR:           74 bpm. Exam Location:  [facility] Procedure: 2D Echo, Cardiac Doppler and Color Doppler  (Both Spectral and Color            Flow Doppler were utilized during procedure). Indications:     Atrial Fibrillation I48.91  History:         Patient has no prior history of Echocardiogram examinations.                  CHF; Risk Factors:Diabetes.  Sonographer:     Christopher Furnace Referring Phys:  [id number] LANETA BLUNT Diagnosing Phys: Dwayne D Callwood MD  Sonographer Comments: Patient is obese and suboptimal apical window. IMPRESSIONS  1. Left ventricular ejection fraction, by estimation, is 55 to 60%. The left ventricle has normal function. The left ventricle has no regional wall motion abnormalities. Left ventricular diastolic parameters are consistent with Grade II diastolic dysfunction (pseudonormalization).  2. Right ventricular systolic function is normal. The right ventricular size is normal.  3. The mitral valve is normal in structure. No evidence of mitral valve regurgitation.  4. The aortic valve is normal in structure. Aortic valve regurgitation is not visualized. FINDINGS  Left Ventricle: Left ventricular ejection fraction, by estimation, is 55 to  60%. The left ventricle has normal function. The left ventricle has no regional wall motion abnormalities. Strain was performed and the global longitudinal strain is indeterminate. The left ventricular internal cavity size was normal in size. There is borderline concentric left ventricular hypertrophy. Left ventricular diastolic parameters are consistent with Grade II diastolic dysfunction (pseudonormalization). Right Ventricle: The right ventricular size is normal. No increase in right ventricular wall thickness. Right ventricular systolic function is normal. Left Atrium: Left atrial size was normal in size. Right Atrium: Right atrial size was normal in size. Pericardium: There is no evidence of pericardial effusion. Mitral Valve: The mitral valve is normal in structure. No evidence of mitral valve regurgitation. MV peak gradient, 5.0 mmHg. The mean mitral  valve gradient is 2.0 mmHg. Tricuspid Valve: The tricuspid valve is normal in structure. Tricuspid valve regurgitation is trivial. Aortic Valve: The aortic valve is normal in structure. Aortic valve regurgitation is not visualized. Aortic valve mean gradient measures 10.0 mmHg. Aortic valve peak gradient measures 19.4 mmHg. Aortic valve area, by VTI measures 2.89 cm. Pulmonic Valve: The pulmonic valve was normal in structure. Pulmonic valve regurgitation is not visualized. Aorta: The ascending aorta was not well visualized. IAS/Shunts: No atrial level shunt detected by color flow Doppler. Additional Comments: 3D was performed not requiring image post processing on an independent workstation and was indeterminate.  LEFT VENTRICLE PLAX 2D LVIDd:         4.90 cm   Diastology LVIDs:         3.40 cm   LV e' medial:    10.70 cm/s LV PW:         1.10 cm   LV E/e' medial:  9.7 LV IVS:        1.20 cm   LV e' lateral:   7.72 cm/s LVOT diam:     2.20 cm   LV E/e' lateral: 13.5 LV SV:         109 LV SV Index:   45 LVOT Area:     3.80 cm LV IVRT:       79 msec  RIGHT VENTRICLE RV Basal diam:  4.20 cm     PULMONARY VEINS RV Mid diam:    1.80 cm     Diastolic Velocity: 31.80 cm/s RV S prime:     20.90 cm/s  S/D Velocity:       1.30 TAPSE (M-mode): 3.7 cm      Systolic Velocity:  42.90 cm/s LEFT ATRIUM           Index        RIGHT ATRIUM           Index LA diam:      2.90 cm 1.20 cm/m   RA Area:     18.00 cm LA Vol (A4C): 79.1 ml 32.87 ml/m  RA Volume:   38.40 ml  15.96 ml/m  AORTIC VALVE AV Area (Vmax):    2.75 cm AV Area (Vmean):   2.74 cm AV Area (VTI):     2.89 cm AV Vmax:           220.00 cm/s AV Vmean:          143.000 cm/s AV VTI:            0.377 m AV Peak Grad:      19.4 mmHg AV Mean Grad:      10.0 mmHg LVOT Vmax:         159.00 cm/s LVOT Vmean:  103.000 cm/s LVOT VTI:          0.287 m LVOT/AV VTI ratio: 0.76  AORTA Ao Root diam: 3.00 cm MITRAL VALVE                TRICUSPID VALVE MV Area (PHT): 3.19 cm      TR Peak grad:   6.6 mmHg MV Area VTI:   2.91 cm     TR Vmax:        128.00 cm/s MV Peak grad:  5.0 mmHg MV Mean grad:  2.0 mmHg     SHUNTS MV Vmax:       1.12 m/s     Systemic VTI:  0.29 m MV Vmean:      71.5 cm/s    Systemic Diam: 2.20 cm MV Decel Time: 238 msec MV E velocity: 104.00 cm/s MV A velocity: 85.90 cm/s MV E/A ratio:  1.21 Cara JONETTA Lovelace MD Electronically signed by Cara JONETTA Lovelace MD Signature Date/Time: 02/01/2024/1:24:12 PM    Final    CT Angio Abd/Pel w/ and/or w/o Result Date: 01/31/2024 CLINICAL DATA:  Persistent GI bleed with melena. EXAM: CT ANGIOGRAPHY ABDOMEN AND PELVIS WITH CONTRAST AND WITHOUT CONTRAST TECHNIQUE: Multidetector CT imaging of the abdomen and pelvis was performed using the standard protocol during bolus administration of intravenous contrast. Multiplanar reconstructed images and MIPs were obtained and reviewed to evaluate the vascular anatomy. RADIATION DOSE REDUCTION: This exam was performed according to the departmental dose-optimization program which includes automated exposure control, adjustment of the mA and/or kV according to patient size and/or use of iterative reconstruction technique. CONTRAST:  OMNIPAQUE  IOHEXOL  350 MG/ML SOLN COMPARISON:  CT abdomen pelvis 10/24/2022 FINDINGS: VASCULAR Aorta: Normal abdominal aorta with normal caliber and no significant atherosclerosis. No dissection. Celiac: Normally patent. Normally patent branch vessels and branch anatomy. SMA: Normally patent. Renals: Normally patent bilateral single renal arteries. IMA: Normally patent IMA. Inflow: Normally patent bilateral iliac arteries. Proximal Outflow: Normally patent bilateral common femoral arteries and femoral bifurcations. Veins: Venous phase imaging demonstrates normal patency of venous structures in the abdomen and pelvis. No mesenteric venous thrombosis. The portal vein is normally patent. No gastric or esophageal varices identified. Review of the MIP images confirms  the above findings. NON-VASCULAR Lower chest: No acute abnormality.  No hiatal hernia. Hepatobiliary: Underlying hepatic steatosis. No overt cirrhosis. No hepatic masses or biliary dilatation. The gallbladder is unremarkable. Pancreas: Diffusely atrophic pancreas without mass or inflammation. No pancreatic ductal dilatation. Spleen: Normal in size without focal abnormality. Adrenals/Urinary Tract: Normal adrenal glands. Kidneys demonstrate no hydronephrosis, focal lesion or calculi bilaterally. The urinary bladder is tremendously distended measuring up to roughly 20.7 x 14 x 15.1 cm for estimated volume of 2275 mL. Stomach/Bowel: Stomach and duodenum demonstrate no evidence of ulcer, mass or lesion. No evidence of active bleeding into the gastrointestinal tract on arterial or venous phases of imaging. Bowel shows no evidence of obstruction, ileus, inflammation or lesion. The appendix is surgically absent. No free intraperitoneal air. Lymphatic: No enlarged lymph nodes identified. Reproductive: Prostate is unremarkable. Other: Irregular subcutaneous soft tissue density in the subcutaneous fat of the upper right abdomen at roughly the level of the gallbladder may relate to a subcutaneous injection site or focal trauma. No ascites. Musculoskeletal: Degenerative disc disease of the lumbar spine, most severely at L5-S1. IMPRESSION: 1. No evidence of active bleeding into the gastrointestinal tract on arterial or venous phases of imaging. 2. No evidence of peptic ulcer disease of the upper gastrointestinal  tract by CT or upper tract varices. 3. Hepatic steatosis. 4. Tremendously distended urinary bladder with estimated volume of 2275 mL. 5. Irregular subcutaneous soft tissue density in the subcutaneous fat of the upper right abdomen at roughly the level of the gallbladder may relate to a subcutaneous injection site or focal trauma. Electronically Signed   By: Marcey Moan M.D.   On: 01/31/2024 18:47   DG Chest Port 1  View Result Date: 01/31/2024 EXAM: 1 VIEW(S) XRAY OF THE CHEST 01/31/2024 03:05:27 PM COMPARISON: None available. CLINICAL HISTORY: Encounter for central line placement. FINDINGS: LINES, TUBES AND DEVICES: Interval placement of right internal jugular catheter with distal tip in expected position of SVC. LUNGS AND PLEURA: No focal pulmonary opacity. No pleural effusion. No pneumothorax. HEART AND MEDIASTINUM: The distal tip of the right internal jugular catheter is in the expected position of the SVC. No acute abnormality of the cardiac and mediastinal silhouettes. BONES AND SOFT TISSUES: No acute osseous abnormality. IMPRESSION: 1. Right internal jugular catheter with the distal tip in the expected position within the superior vena cava. Electronically signed by: Lynwood Seip MD 01/31/2024 03:26 PM EST RP Workstation: [id number]   DG Chest Port 1 View Result Date: 01/31/2024 EXAM: 1 VIEW(S) XRAY OF THE CHEST 01/31/2024 10:43:58 AM COMPARISON: 09/06/2023 CLINICAL HISTORY: SOB (shortness of breath); Atrial fibrillation with RVR (HCC) FINDINGS: LUNGS AND PLEURA: Low lung volumes. No focal pulmonary opacity. No pleural effusion. No pneumothorax. HEART AND MEDIASTINUM: No acute abnormality of the cardiac and mediastinal silhouettes. BONES AND SOFT TISSUES: No acute osseous abnormality. IMPRESSION: 1. Low lung volumes, No acute cardiopulmonary process. Electronically signed by: Katheleen Faes MD 01/31/2024 10:58 AM EST RP Workstation: [id number]    ECHO as above  TELEMETRY (personally reviewed): NSR rate 70s  EKG (personally reviewed): none for review this admission  Data reviewed by me 02/06/2024: last 24h vitals tele labs imaging I/O ED provider note, admission H&P, hospitalist progress note  Principal Problem:   GI bleed Active Problems:   Acute blood loss anemia   Upper GI bleeding   Melena   Hematochezia   DU (duodenal ulcer)   Blood in stool   Duodenal ulcer   Blood loss anemia   Bipolar  disorder (HCC)    ASSESSMENT AND PLAN:  RHYLAND HINDERLITER is a 58 y.o. male  with a past medical history of paroxysmal atrial fibrillation not on AC, chronic diastolic heart failure, hx R BKA and L AKA, L forearm amputation, hx GIB 2/2 peptic ulcer who presented to the ED on 01/30/2024 for back pain, black tarry stool. Given transfusion overnight for significant anemia. Went into AF RVR this AM. Cardiology was consulted for further evaluation.   # Atrial fibrillation RVR # Paroxysmal atrial fibrillation # Suspected acute on chronic HFpEF # GI bleed, ABLA - s/p hemoclips x2 02/01/2024 Patient presented with back pain and episode of black tarry stool, found to have Hgb down to 6.0 from 11.3 three days prior. Transfused overnight with not much response. 01/31/2024 converted to AF RVR. Echo this admission with EF 55-60%, no WMAs, grade II diastolic dysfunction.  -Continue to PO amiodarone  loading with 400 mg BID for 7 days followed by 200 daily.  -Ultimately patient would benefit from anticoagulation as CHADS-VASc is 4 however given hx of GI bleeds and presentation this admission he is a poor candidate for anticoagulation. -Continue atorvastatin  80 mg daily.  -Further management per primary team, ortho.   This patient's plan of care was discussed and created  with Dr. Ammon and he is in agreement.  Signed: Danita Bloch, PA-C  02/06/2024, 9:13 AM [facility]

## 2024-02-07 LAB — CBC
HCT: 26.6 % — ABNORMAL LOW (ref 39.0–52.0)
Hemoglobin: 8.2 g/dL — ABNORMAL LOW (ref 13.0–17.0)
MCH: 26.4 pg (ref 26.0–34.0)
MCHC: 30.8 g/dL (ref 30.0–36.0)
MCV: 85.5 fL (ref 80.0–100.0)
Platelets: 211 K/uL (ref 150–400)
RBC: 3.11 MIL/uL — ABNORMAL LOW (ref 4.22–5.81)
RDW: 18.4 % — ABNORMAL HIGH (ref 11.5–15.5)
WBC: 4.4 K/uL (ref 4.0–10.5)
nRBC: 0 % (ref 0.0–0.2)

## 2024-02-07 LAB — BASIC METABOLIC PANEL WITH GFR
Anion gap: 7 (ref 5–15)
BUN: 15 mg/dL (ref 6–20)
CO2: 29 mmol/L (ref 22–32)
Calcium: 7.9 mg/dL — ABNORMAL LOW (ref 8.9–10.3)
Chloride: 102 mmol/L (ref 98–111)
Creatinine, Ser: 0.62 mg/dL (ref 0.61–1.24)
GFR, Estimated: >60 mL/min (ref >60)
Glucose, Bld: 139 mg/dL — ABNORMAL HIGH (ref 70–99)
Potassium: 4.2 mmol/L (ref 3.5–5.1)
Sodium: 138 mmol/L (ref 135–145)

## 2024-02-07 LAB — GLUCOSE, CAPILLARY
Glucose-Capillary: 111 mg/dL — ABNORMAL HIGH (ref 70–99)
Glucose-Capillary: 160 mg/dL — ABNORMAL HIGH (ref 70–99)
Glucose-Capillary: 163 mg/dL — ABNORMAL HIGH (ref 70–99)
Glucose-Capillary: 169 mg/dL — ABNORMAL HIGH (ref 70–99)

## 2024-02-07 LAB — MAGNESIUM: Magnesium: 2.1 mg/dL (ref 1.7–2.4)

## 2024-02-07 MED ORDER — LINEZOLID 600 MG PO TABS
600.0000 mg | ORAL_TABLET | Freq: Two times a day (BID) | ORAL | Status: DC
  Administered 2024-02-07 – 2024-02-09 (×4): 600 mg via ORAL
  Filled 2024-02-07 (×4): qty 1

## 2024-02-07 MED ORDER — SODIUM CHLORIDE 0.9% FLUSH: 10.0000 mL | INTRAVENOUS | Status: DC | PRN

## 2024-02-07 NOTE — Progress Notes (Signed)
 PHARMACIST - PHYSICIAN COMMUNICATION DR:   TRH CONCERNING: Antibiotic IV to Oral Route Change Policy  RECOMMENDATION: This patient is receiving Linezolid  by the intravenous route.  Based on criteria approved by the Pharmacy and Therapeutics Committee, the antibiotic(s) is/are being converted to the equivalent oral dose form(s).   DESCRIPTION: These criteria include: Patient being treated for a respiratory tract infection, urinary tract infection, cellulitis or clostridium difficile associated diarrhea if on metronidazole The patient is not neutropenic and does not exhibit a GI malabsorption state The patient is eating (either orally or via tube) and/or has been taking other orally administered medications for a least 24 hours The patient is improving clinically and has a Tmax < 100.5  If you have questions about this conversion, please contact the Pharmacy Department  []   [phone] )  Russell [name] [x]   [phone] )  Russell [name] []   [phone] )  Russell [name] []   [phone] )  [facility] []   [phone] )  [facility]   Celestine Slovak, PharmD, [place], [place] Work Cell: [phone] 02/07/2024 11:12 AM

## 2024-02-07 NOTE — TOC Progression Note (Signed)
 Transition of Care [facility]) - Progression Note    Patient Details  Name: Russell [name] MRN: [id number] Date of Birth: [birth year]  [facility] CM/SW Contact  [name], RN Phone Number: 02/07/2024, 11:29 AM  Clinical Narrative:     Met with patient at bedside and presented bed offers.  Patient selects [facility], and states that his daughter lives in [place].  Selected in HUB and notified Birttany with [facility].  Message sent to MD to determine if patient is medically appropriate for auth to be started     Barriers to Discharge: Continued Medical Work up               Expected Discharge Plan and Services                                               Social Drivers of Health (SDOH) Interventions SDOH Screenings   Food Insecurity: Food Insecurity Present (02/02/2024)  Housing: High Risk (02/02/2024)  Transportation Needs: Unmet Transportation Needs (02/02/2024)  Utilities: Not At Risk (02/02/2024)  Financial Resource Strain: Low Risk  (04/04/2023)   Received from [facility]  Tobacco Use: Low Risk  (02/02/2024)    Readmission Risk Interventions    03/03/2023   12:27 PM 10/25/2022    3:55 PM  Readmission Risk Prevention Plan  Transportation Screening Complete Complete  PCP or Specialist Appt within 3-5 Days  Complete  HRI or Home Care Consult  Complete  Social Work Consult for Recovery Care Planning/Counseling  Complete  Palliative Care Screening  Not Applicable  Medication Review [employer]) Complete Complete  PCP or Specialist appointment within 3-5 days of discharge Complete   HRI or Home Care Consult Complete   Palliative Care Screening Not Applicable   Skilled Nursing Facility Not Applicable

## 2024-02-07 NOTE — Progress Notes (Signed)
 [facility] CLINIC CARDIOLOGY PROGRESS NOTE       Patient ID: ABDULKAREEM [name] MRN: [id number] DOB/AGE: 07-31-1965 58 y.o.  Admit date: 01/30/2024 Referring Physician Dr. Laneta Blunt Primary Physician Olmedo, [name], MD  Primary Cardiologist [name] (previously seen by [name], [name]) Reason for Consultation AF RVR  HPI: Russell [name] is a 58 y.o. male  with a past medical history of paroxysmal atrial fibrillation not on AC, chronic diastolic heart failure, hx R BKA and L AKA, L forearm amputation, hx GIB 2/2 peptic ulcer who presented to the ED on 01/30/2024 for back pain, black tarry stool. Given transfusion overnight for significant anemia. Went into AF RVR this AM. Cardiology was consulted for further evaluation.   Interval history: -Patient seen and examined this AM, resting comfortably in hospital bed. -Feeling well today without complaints of palpitations, SOB. Endorses constipation. -Remaining in NSR. -Hgb 8.2 on AM labs.   Review of systems complete and found to be negative unless listed above    Past Medical History:  Diagnosis Date   Anxiety    CHF (congestive heart failure) (HCC)    EF 30-35%   Chronic diastolic (congestive) heart failure (HCC)    Depression    Diabetes mellitus without complication (HCC)    Hyperlipemia    Hypertension    Morbid obesity (HCC)    Peripheral edema     Past Surgical History:  Procedure Laterality Date   AMPUTATION Left 09/07/2023   Procedure: AMPUTATION, ABOVE KNEE;  Surgeon: Marea Selinda RAMAN, MD;  Location: [facility] ORS;  Service: General;  Laterality: Left;   APPENDECTOMY     BELOW KNEE LEG AMPUTATION     Right leg   BIOPSY  10/14/2022   Procedure: BIOPSY;  Surgeon: Aundria, Ladell POUR, MD;  Location: [facility] ENDOSCOPY;  Service: Gastroenterology;;   COLONOSCOPY N/A 10/16/2022   Procedure: COLONOSCOPY;  Surgeon: Therisa Bi, MD;  Location: [facility] ENDOSCOPY;  Service: Gastroenterology;  Laterality: N/A;   ESOPHAGOGASTRODUODENOSCOPY  N/A 02/01/2024   Procedure: EGD (ESOPHAGOGASTRODUODENOSCOPY);  Surgeon: Jinny Carmine, MD;  Location: [facility] ENDOSCOPY;  Service: Endoscopy;  Laterality: N/A;   ESOPHAGOGASTRODUODENOSCOPY (EGD) WITH PROPOFOL  N/A 10/26/2022   Procedure: ESOPHAGOGASTRODUODENOSCOPY (EGD) WITH PROPOFOL ;  Surgeon: Maryruth Ole DASEN, MD;  Location: [facility] ENDOSCOPY;  Service: Endoscopy;  Laterality: N/A;   ESOPHAGOGASTRODUODENOSCOPY (EGD) WITH PROPOFOL  N/A 10/14/2022   Procedure: ESOPHAGOGASTRODUODENOSCOPY (EGD) WITH PROPOFOL ;  Surgeon: Toledo, Ladell POUR, MD;  Location: [facility] ENDOSCOPY;  Service: Gastroenterology;  Laterality: N/A;   HEMOSTASIS CLIP PLACEMENT  02/01/2024   Procedure: CONTROL OF HEMORRHAGE, GI TRACT, ENDOSCOPIC, BY CLIPPING OR OVERSEWING;  Surgeon: Jinny Carmine, MD;  Location: [facility] ENDOSCOPY;  Service: Endoscopy;;   INCISION AND DRAINAGE ABSCESS Left 02/27/2023   Procedure: INCISION AND DRAINAGE ABSCESS LEFT FINGER AND ARM;  Surgeon: Robbin Redell Ned, MD;  Location: [facility] ORS;  Service: Orthopedics;  Laterality: Left;   TONSILLECTOMY      Medications Prior to Admission  Medication Sig Dispense Refill Last Dose/Taking   [EXPIRED] amoxicillin -clavulanate (AUGMENTIN ) 875-125 MG tablet Take 1 tablet by mouth 2 (two) times daily for 7 days. 14 tablet 0 Unknown   aspirin  EC 81 MG tablet Take 81 mg by mouth.  Take 1 tablet (81 mg total) by mouth once daily   Unknown   atorvastatin  (LIPITOR) 40 MG tablet Take 40 mg by mouth daily.    Unknown   baclofen  (LIORESAL ) 10 MG tablet Take 15 mg by mouth every 8 (eight) hours.   Unknown   cetirizine (  ZYRTEC) 10 MG tablet Take 10 mg by mouth daily as needed for allergies.   Unknown   DULoxetine  (CYMBALTA ) 30 MG capsule Take 90 mg by mouth daily.   Unknown   finasteride  (PROSCAR ) 5 MG tablet Take 5 mg by mouth daily.   Unknown   furosemide  (LASIX ) 80 MG tablet Take 80 mg by mouth 2 (two) times daily.   Unknown   gabapentin  (NEURONTIN ) 600 MG tablet Take 600 mg by mouth 3  (three) times daily.   Unknown   insulin  degludec (TRESIBA) 200 UNIT/ML FlexTouch Pen Inject 20 Units into the skin at bedtime.   Unknown   insulin  glargine, 1 Unit Dial, (TOUJEO  SOLOSTAR) 300 UNIT/ML Solostar Pen Inject 20 Units into the skin at bedtime.   Unknown   insulin  lispro (HUMALOG) 100 UNIT/ML KwikPen Inject 20 Units into the skin 3 (three) times daily.   Unknown   loperamide  (IMODIUM  A-D) 2 MG tablet Take 2 mg by mouth as needed for diarrhea or loose stools.   Unknown   melatonin 5 MG TABS Take 1 tablet (5 mg total) by mouth at bedtime.   Unknown   metoprolol  succinate (TOPROL -XL) 50 MG 24 hr tablet Take 50 mg by mouth at bedtime.   Unknown   MOUNJARO 2.5 MG/0.5ML Pen Inject 2.5 mg into the skin once a week.   Unknown   MOVANTIK 25 MG TABS tablet Take 25 mg by mouth daily.   Unknown   nitrofurantoin  (MACRODANTIN ) 50 MG capsule Take 50 mg by mouth every 6 (six) hours.   Unknown   oxyCODONE  (ROXICODONE ) 15 MG immediate release tablet Take 15 mg by mouth every 6 (six) hours as needed.   Unknown   oxyCODONE  (ROXICODONE ) 15 MG immediate release tablet Take 1 tablet (15 mg total) by mouth every 6 (six) hours as needed for pain. 30 tablet 0 Unknown   potassium chloride  (MICRO-K ) 10 MEQ CR capsule Take by mouth 2 (two) times daily. :1 Tablet(s) By Mouth Twice Daily   Unknown   pregabalin  (LYRICA ) 150 MG capsule Take 150 mg by mouth 3 (three) times daily.   Unknown   senna (SENOKOT) 8.6 MG TABS tablet Take 2 tablets by mouth daily.   Unknown   senna-docusate (SENOKOT-S) 8.6-50 MG tablet Take 2 tablets by mouth 2 (two) times daily.   Unknown   tamsulosin  (FLOMAX ) 0.4 MG CAPS capsule Take 0.8 mg by mouth daily.   Unknown   acetaminophen  (TYLENOL ) 325 MG tablet Take 2 tablets (650 mg total) by mouth every 6 (six) hours as needed for mild pain (pain score 1-3) (or Fever >/= 101).   Unknown   bisacodyl  (DULCOLAX) 10 MG suppository Place 10 mg rectally daily as needed for moderate constipation.    Unknown   buPROPion  (WELLBUTRIN  XL) 150 MG 24 hr tablet Take 1 tablet (150 mg total) by mouth daily. 30 tablet 0    diclofenac Sodium (VOLTAREN) 1 % GEL Apply 2 g topically daily as needed (for pain).   Unknown   diphenhydrAMINE  (BENADRYL ) 25 mg capsule Take 1 capsule (25 mg total) by mouth every 6 (six) hours as needed for allergies or itching (30 minutes prior to starting Vancomycin ). (Patient not taking: Reported on 10/06/2023)   Unknown   HUMULIN  R 100 UNIT/ML injection Inject 0.1 mLs (10 Units total) into the skin 3 (three) times daily with meals.   Unknown   mupirocin  ointment (BACTROBAN ) 2 % Apply to penis topically as needed.   Unknown   oxymetazoline  (AFRIN)  0.05 % nasal spray Place 1 spray into both nostrils 2 (two) times daily as needed for congestion.   Unknown   polyethylene glycol (MIRALAX  / GLYCOLAX ) 17 g packet Take 17 g by mouth daily as needed.   Unknown   Social History   Socioeconomic History   Marital status: Legally Separated    Spouse name: Not on file   Number of children: Not on file   Years of education: Not on file   Highest education level: Not on file  Occupational History   Not on file  Tobacco Use   Smoking status: Never   Smokeless tobacco: Never  Vaping Use   Vaping status: Never Used  Substance and Sexual Activity   Alcohol use: No   Drug use: No   Sexual activity: Not Currently  Other Topics Concern   Not on file  Social History Narrative   Living with mother now   Wheelchair bound at baseline   Social Drivers of Health   Financial Resource Strain: Low Risk  (04/04/2023)   Received from [facility]   Overall Financial Resource Strain (CARDIA)    Difficulty of Paying Living Expenses: Not hard at all  Food Insecurity: Food Insecurity Present (02/02/2024)   Hunger Vital Sign    Worried About Running Out of Food in the Last Year: Often true    Ran Out of Food in the Last Year: Often true  Transportation Needs: Unmet  Transportation Needs (02/02/2024)   PRAPARE - Administrator, Civil Service (Medical): Yes    Lack of Transportation (Non-Medical): Yes  Physical Activity: Not on file  Stress: Not on file  Social Connections: Not on file  Intimate Partner Violence: Not At Risk (02/02/2024)   Humiliation, Afraid, Rape, and Kick questionnaire    Fear of Current or Ex-Partner: No    Emotionally Abused: No    Physically Abused: No    Sexually Abused: No    Family History  Problem Relation Age of Onset   COPD Mother    Diabetes Father    CAD Father    Kidney cancer Brother      Vitals:   02/06/24 1551 02/06/24 2202 02/07/24 0322 02/07/24 0726  BP:  (!) 116/52 (!) 111/52 (!) 116/59  Pulse: 67 (!) 58 (!) 58 (!) 58  Resp: 16 18 16 11   Temp:  98.1 F (36.7 C) 97.6 F (36.4 C) 98.1 F (36.7 C)  TempSrc: Oral   Oral  SpO2: 100% 93% 94% 92%  Weight:      Height:        PHYSICAL EXAM General: Chronically ill appearing male, well nourished, in no acute distress. HEENT: Normocephalic and atraumatic. Neck: No JVD.  Lungs: Normal respiratory effort on room air. Clear bilaterally to auscultation. No wheezes, crackles, rhonchi.  Heart: HRRR. Normal S1 and S2 without gallops or murmurs.  Abdomen: Non-distended appearing.  Msk: Normal strength and tone for age. Extremities: S/p L AKA and R BKA. Neuro: Alert and oriented X 3. Somnolent. Psych: Answers questions appropriately.   Labs: Basic Metabolic Panel: Recent Labs    02/07/24 0443  NA 138  K 4.2  CL 102  CO2 29  GLUCOSE 139*  BUN 15  CREATININE 0.62  CALCIUM  7.9*  MG 2.1   Liver Function Tests: No results for input(s): AST, ALT, ALKPHOS, BILITOT, PROT, ALBUMIN  in the last 72 hours. No results for input(s): LIPASE, AMYLASE in the last 72 hours. CBC: Recent Labs  02/07/24 0443  WBC 4.4  HGB 8.2*  HCT 26.6*  MCV 85.5  PLT 211   Cardiac Enzymes: No results for input(s): CKTOTAL, CKMB,  CKMBINDEX, TROPONINIHS in the last 72 hours. BNP: No results for input(s): BNP in the last 72 hours. D-Dimer: No results for input(s): DDIMER in the last 72 hours. Hemoglobin A1C: No results for input(s): HGBA1C in the last 72 hours. Fasting Lipid Panel: No results for input(s): CHOL, HDL, LDLCALC, TRIG, CHOLHDL, LDLDIRECT in the last 72 hours. Thyroid  Function Tests: No results for input(s): TSH, T4TOTAL, T3FREE, THYROIDAB in the last 72 hours.  Invalid input(s): FREET3 Anemia Panel: No results for input(s): VITAMINB12, FOLATE, FERRITIN, TIBC, IRON , RETICCTPCT in the last 72 hours.    Radiology: DG Hand Complete Right Result Date: 02/02/2024 EXAM: 3 or more VIEW(S) XRAY OF THE RIGHT HAND 02/02/2024 03:11:00 PM COMPARISON: None available. CLINICAL HISTORY: Wound dehiscence FINDINGS: BONES AND JOINTS: Osteolytic changes of the distal phalanx of the right index finger. SOFT TISSUES: Soft tissue swelling and dorsal and anterior ulceration associated with the right index finger. Soft tissue ulceration at the tip of the right middle finger. IMPRESSION: 1. Osteolytic changes of the distal phalanx of the right index finger with associated soft tissue swelling and dorsal and anterior ulceration. Findings consistent with osteomyelitis. 2. Soft tissue ulceration at the tip of the right middle finger without underlying osteolytic change. Electronically signed by: Franky Stanford MD 02/02/2024 08:14 PM EST RP Workstation: [id number]   ECHOCARDIOGRAM COMPLETE Result Date: 02/01/2024    ECHOCARDIOGRAM REPORT   Patient Name:   Russell [name] Date of Exam: 02/01/2024 Medical Rec #:  [id number]       Height:       71.0 in Accession #:    [id number]      Weight:       273.0 lb Date of Birth:  [birth year]        BSA:          2.407 m Patient Age:    58 years        BP:           129/59 mmHg Patient Gender: M               HR:           74 bpm. Exam Location:  [facility] Procedure:  2D Echo, Cardiac Doppler and Color Doppler (Both Spectral and Color            Flow Doppler were utilized during procedure). Indications:     Atrial Fibrillation I48.91  History:         Patient has no prior history of Echocardiogram examinations.                  CHF; Risk Factors:Diabetes.  Sonographer:     Christopher Furnace Referring Phys:  [id number] LANETA BLUNT Diagnosing Phys: Dwayne D Callwood MD  Sonographer Comments: Patient is obese and suboptimal apical window. IMPRESSIONS  1. Left ventricular ejection fraction, by estimation, is 55 to 60%. The left ventricle has normal function. The left ventricle has no regional wall motion abnormalities. Left ventricular diastolic parameters are consistent with Grade II diastolic dysfunction (pseudonormalization).  2. Right ventricular systolic function is normal. The right ventricular size is normal.  3. The mitral valve is normal in structure. No evidence of mitral valve regurgitation.  4. The aortic valve is normal in structure. Aortic valve regurgitation is not visualized. FINDINGS  Left Ventricle: Left ventricular ejection  fraction, by estimation, is 55 to 60%. The left ventricle has normal function. The left ventricle has no regional wall motion abnormalities. Strain was performed and the global longitudinal strain is indeterminate. The left ventricular internal cavity size was normal in size. There is borderline concentric left ventricular hypertrophy. Left ventricular diastolic parameters are consistent with Grade II diastolic dysfunction (pseudonormalization). Right Ventricle: The right ventricular size is normal. No increase in right ventricular wall thickness. Right ventricular systolic function is normal. Left Atrium: Left atrial size was normal in size. Right Atrium: Right atrial size was normal in size. Pericardium: There is no evidence of pericardial effusion. Mitral Valve: The mitral valve is normal in structure. No evidence of mitral valve regurgitation.  MV peak gradient, 5.0 mmHg. The mean mitral valve gradient is 2.0 mmHg. Tricuspid Valve: The tricuspid valve is normal in structure. Tricuspid valve regurgitation is trivial. Aortic Valve: The aortic valve is normal in structure. Aortic valve regurgitation is not visualized. Aortic valve mean gradient measures 10.0 mmHg. Aortic valve peak gradient measures 19.4 mmHg. Aortic valve area, by VTI measures 2.89 cm. Pulmonic Valve: The pulmonic valve was normal in structure. Pulmonic valve regurgitation is not visualized. Aorta: The ascending aorta was not well visualized. IAS/Shunts: No atrial level shunt detected by color flow Doppler. Additional Comments: 3D was performed not requiring image post processing on an independent workstation and was indeterminate.  LEFT VENTRICLE PLAX 2D LVIDd:         4.90 cm   Diastology LVIDs:         3.40 cm   LV e' medial:    10.70 cm/s LV PW:         1.10 cm   LV E/e' medial:  9.7 LV IVS:        1.20 cm   LV e' lateral:   7.72 cm/s LVOT diam:     2.20 cm   LV E/e' lateral: 13.5 LV SV:         109 LV SV Index:   45 LVOT Area:     3.80 cm LV IVRT:       79 msec  RIGHT VENTRICLE RV Basal diam:  4.20 cm     PULMONARY VEINS RV Mid diam:    1.80 cm     Diastolic Velocity: 31.80 cm/s RV S prime:     20.90 cm/s  S/D Velocity:       1.30 TAPSE (M-mode): 3.7 cm      Systolic Velocity:  42.90 cm/s LEFT ATRIUM           Index        RIGHT ATRIUM           Index LA diam:      2.90 cm 1.20 cm/m   RA Area:     18.00 cm LA Vol (A4C): 79.1 ml 32.87 ml/m  RA Volume:   38.40 ml  15.96 ml/m  AORTIC VALVE AV Area (Vmax):    2.75 cm AV Area (Vmean):   2.74 cm AV Area (VTI):     2.89 cm AV Vmax:           220.00 cm/s AV Vmean:          143.000 cm/s AV VTI:            0.377 m AV Peak Grad:      19.4 mmHg AV Mean Grad:      10.0 mmHg LVOT Vmax:  159.00 cm/s LVOT Vmean:        103.000 cm/s LVOT VTI:          0.287 m LVOT/AV VTI ratio: 0.76  AORTA Ao Root diam: 3.00 cm MITRAL VALVE                 TRICUSPID VALVE MV Area (PHT): 3.19 cm     TR Peak grad:   6.6 mmHg MV Area VTI:   2.91 cm     TR Vmax:        128.00 cm/s MV Peak grad:  5.0 mmHg MV Mean grad:  2.0 mmHg     SHUNTS MV Vmax:       1.12 m/s     Systemic VTI:  0.29 m MV Vmean:      71.5 cm/s    Systemic Diam: 2.20 cm MV Decel Time: 238 msec MV E velocity: 104.00 cm/s MV A velocity: 85.90 cm/s MV E/A ratio:  1.21 Cara JONETTA Lovelace MD Electronically signed by Cara JONETTA Lovelace MD Signature Date/Time: 02/01/2024/1:24:12 PM    Final    CT Angio Abd/Pel w/ and/or w/o Result Date: 01/31/2024 CLINICAL DATA:  Persistent GI bleed with melena. EXAM: CT ANGIOGRAPHY ABDOMEN AND PELVIS WITH CONTRAST AND WITHOUT CONTRAST TECHNIQUE: Multidetector CT imaging of the abdomen and pelvis was performed using the standard protocol during bolus administration of intravenous contrast. Multiplanar reconstructed images and MIPs were obtained and reviewed to evaluate the vascular anatomy. RADIATION DOSE REDUCTION: This exam was performed according to the departmental dose-optimization program which includes automated exposure control, adjustment of the mA and/or kV according to patient size and/or use of iterative reconstruction technique. CONTRAST:  OMNIPAQUE  IOHEXOL  350 MG/ML SOLN COMPARISON:  CT abdomen pelvis 10/24/2022 FINDINGS: VASCULAR Aorta: Normal abdominal aorta with normal caliber and no significant atherosclerosis. No dissection. Celiac: Normally patent. Normally patent branch vessels and branch anatomy. SMA: Normally patent. Renals: Normally patent bilateral single renal arteries. IMA: Normally patent IMA. Inflow: Normally patent bilateral iliac arteries. Proximal Outflow: Normally patent bilateral common femoral arteries and femoral bifurcations. Veins: Venous phase imaging demonstrates normal patency of venous structures in the abdomen and pelvis. No mesenteric venous thrombosis. The portal vein is normally patent. No gastric or esophageal varices  identified. Review of the MIP images confirms the above findings. NON-VASCULAR Lower chest: No acute abnormality.  No hiatal hernia. Hepatobiliary: Underlying hepatic steatosis. No overt cirrhosis. No hepatic masses or biliary dilatation. The gallbladder is unremarkable. Pancreas: Diffusely atrophic pancreas without mass or inflammation. No pancreatic ductal dilatation. Spleen: Normal in size without focal abnormality. Adrenals/Urinary Tract: Normal adrenal glands. Kidneys demonstrate no hydronephrosis, focal lesion or calculi bilaterally. The urinary bladder is tremendously distended measuring up to roughly 20.7 x 14 x 15.1 cm for estimated volume of 2275 mL. Stomach/Bowel: Stomach and duodenum demonstrate no evidence of ulcer, mass or lesion. No evidence of active bleeding into the gastrointestinal tract on arterial or venous phases of imaging. Bowel shows no evidence of obstruction, ileus, inflammation or lesion. The appendix is surgically absent. No free intraperitoneal air. Lymphatic: No enlarged lymph nodes identified. Reproductive: Prostate is unremarkable. Other: Irregular subcutaneous soft tissue density in the subcutaneous fat of the upper right abdomen at roughly the level of the gallbladder may relate to a subcutaneous injection site or focal trauma. No ascites. Musculoskeletal: Degenerative disc disease of the lumbar spine, most severely at L5-S1. IMPRESSION: 1. No evidence of active bleeding into the gastrointestinal tract on arterial or venous phases of imaging.  2. No evidence of peptic ulcer disease of the upper gastrointestinal tract by CT or upper tract varices. 3. Hepatic steatosis. 4. Tremendously distended urinary bladder with estimated volume of 2275 mL. 5. Irregular subcutaneous soft tissue density in the subcutaneous fat of the upper right abdomen at roughly the level of the gallbladder may relate to a subcutaneous injection site or focal trauma. Electronically Signed   By: Marcey Moan  M.D.   On: 01/31/2024 18:47   DG Chest Port 1 View Result Date: 01/31/2024 EXAM: 1 VIEW(S) XRAY OF THE CHEST 01/31/2024 03:05:27 PM COMPARISON: None available. CLINICAL HISTORY: Encounter for central line placement. FINDINGS: LINES, TUBES AND DEVICES: Interval placement of right internal jugular catheter with distal tip in expected position of SVC. LUNGS AND PLEURA: No focal pulmonary opacity. No pleural effusion. No pneumothorax. HEART AND MEDIASTINUM: The distal tip of the right internal jugular catheter is in the expected position of the SVC. No acute abnormality of the cardiac and mediastinal silhouettes. BONES AND SOFT TISSUES: No acute osseous abnormality. IMPRESSION: 1. Right internal jugular catheter with the distal tip in the expected position within the superior vena cava. Electronically signed by: Lynwood Seip MD 01/31/2024 03:26 PM EST RP Workstation: [id number]   DG Chest Port 1 View Result Date: 01/31/2024 EXAM: 1 VIEW(S) XRAY OF THE CHEST 01/31/2024 10:43:58 AM COMPARISON: 09/06/2023 CLINICAL HISTORY: SOB (shortness of breath); Atrial fibrillation with RVR (HCC) FINDINGS: LUNGS AND PLEURA: Low lung volumes. No focal pulmonary opacity. No pleural effusion. No pneumothorax. HEART AND MEDIASTINUM: No acute abnormality of the cardiac and mediastinal silhouettes. BONES AND SOFT TISSUES: No acute osseous abnormality. IMPRESSION: 1. Low lung volumes, No acute cardiopulmonary process. Electronically signed by: Katheleen Faes MD 01/31/2024 10:58 AM EST RP Workstation: [id number]    ECHO as above  TELEMETRY (personally reviewed): not on tele  EKG (personally reviewed): none for review this admission  Data reviewed by me 02/07/2024: last 24h vitals tele labs imaging I/O ED provider note, admission H&P, hospitalist progress note  Principal Problem:   GI bleed Active Problems:   Acute blood loss anemia   Upper GI bleeding   Melena   Hematochezia   DU (duodenal ulcer)   Blood in stool    Duodenal ulcer   Blood loss anemia   Bipolar disorder (HCC)   Finger osteomyelitis (HCC)    ASSESSMENT AND PLAN:  Russell [name] is a 58 y.o. male  with a past medical history of paroxysmal atrial fibrillation not on AC, chronic diastolic heart failure, hx R BKA and L AKA, L forearm amputation, hx GIB 2/2 peptic ulcer who presented to the ED on 01/30/2024 for back pain, black tarry stool. Given transfusion overnight for significant anemia. Went into AF RVR this AM. Cardiology was consulted for further evaluation.   # Atrial fibrillation RVR # Paroxysmal atrial fibrillation # Suspected acute on chronic HFpEF # GI bleed, ABLA - s/p hemoclips x2 02/01/2024 Patient presented with back pain and episode of black tarry stool, found to have Hgb down to 6.0 from 11.3 three days prior. Transfused overnight with not much response. 01/31/2024 converted to AF RVR. Echo this admission with EF 55-60%, no WMAs, grade II diastolic dysfunction.  -Continue to PO amiodarone  loading with 400 mg BID for 7 days followed by 200 daily.  -Ultimately patient would benefit from anticoagulation as CHADS-VASc is 4 however given hx of GI bleeds and presentation this admission he is a poor candidate for anticoagulation. Can consider at outpatient follow up if  Hgb remains stable with no recurrence of bleeding. -Continue atorvastatin  80 mg daily.  -Further management per primary team, ortho.  Cardiology will sign off. Please haiku with questions or re-engage if needed. Follow up with [name], PA in 1-2 weeks.   This patient's plan of care was discussed and created with Dr. Ammon and he is in agreement.  Signed: Danita Bloch, PA-C  02/07/2024, 9:13 AM [facility]

## 2024-02-07 NOTE — Progress Notes (Signed)
 PROGRESS NOTE    Russell [name]  FMW:[id number] DOB: 08/14/1965 DOA: 01/30/2024 PCP: Eliverto [name], MD  [id number]  LOS: 8 days   Brief hospital course:   Assessment & Plan: Russell [name] is a 58 year old male with a medical history significant for peripheral vascular disease s/p right sided BKA, left sided AKA, left sided forearm amputation, chronic HFrEF with recovered LVEF, Type II Diabetes Mellitus, BPH, hypertension, hyperlipidemia and a GI bleed secondary to peptic ulcer, who presented to [facility] ED with concern for black tarry stool. At time of arrival he had complaints of tailbone pain, reporting he has been taking [employer] Good[employer] powder for 3 days. Upon arrival he was hemodynamically stable with a hemoglobin was 6.0.   01/31/24: H&H not improving after 2 units pRBCs, pending repeat after 4th unit pRBCs. Developed afib rvr. Placed on amiodarone  gtt. Later developed hypotension requiring levophed  gtt. transferred to the ICU.  Off levophed  on 12/4.  Transferred to [facility] on 12/5.   #Hypovolemic Shock s/t acute GIB --off pressor.  #Acute Metabolic Encephalopathy- resolved #Bipolar Disorder  #Chronic Pain --d/c'ed IV dilaudid  --cont Percocet PRN --cont increased Lyrica    #Acute Hypoxic Respiratory Failure s/t Hypovolemic Shock --resolved.  Currently on RA.   #Atrial Fibrillation with RVR --cardio consulted --cont oral amio --hold anticoagulation for now due to bleeding finger  Right finger osteomyelitis --Has seen ortho and may need amputation but pt hoping to preserve function.  --ID consulted, cx sent --cont linezolid  and zosyn     AKI ruled out CKD 4, ruled out CKD 2 --did not meet criteria for AKI.  GFR >60.   #Acute Metabolic Acidosis s/t Hemorrhagic Shock with Lactic Acidosis #Hyponatremia   #Acute Gastrointestinal Bleed Hx: Gastric Ulcers --EGD with a duodenal ulcer seen with a pigmented vessel at the site that was clipped with 2 hemoclips.  --If the  patient should have any further bleeding vascular surgery should attempt to embolize the area where the clips were placed.  --cont oral PPI BID   #Acute Blood Loss Anemia s/t GI Bleed --s/p 8u pRBC.  Hgb currently around 8's. --monitor and transfuse PRN  #Type II Diabetes Mellitus --A1c 9.5 --cont glargine 30u nightly --ACHS and SSI --need to stay on carb consistent diet  #DKA ~ Resolved  #Leukocytosis likely reactive in setting of Shock - Trend WBC and monitor fever curve - Blood cultures pending - Hold off on abx for now  Acute urinary retention --Foley placed on 12/2 --cont Flomax  --consider voiding trial   DVT prophylaxis: Lovenox  SQ Code Status: DNR  Family Communication:  Level of care: Med-Surg Dispo:   The patient is from: home Anticipated d/c is to: SNF Anticipated d/c date is: when bed available   Subjective and Interval History:  Pt reported being constipated.   Objective: Vitals:   02/07/24 0322 02/07/24 0726 02/07/24 1706 02/07/24 1952  BP: (!) 111/52 (!) 116/59 124/70 130/71  Pulse: (!) 58 (!) 58 61 63  Resp: 16 11 16 18   Temp: 97.6 F (36.4 C) 98.1 F (36.7 C) 98.2 F (36.8 C) 98.8 F (37.1 C)  TempSrc:  Oral Oral Oral  SpO2: 94% 92% 97% 98%  Weight:      Height:        Intake/Output Summary (Last 24 hours) at 02/07/2024 2225 Last data filed at 02/07/2024 1200 Gross per 24 hour  Intake 592.54 ml  Output 2150 ml  Net -1557.46 ml   Filed Weights   01/30/24 0813 02/04/24 0000  Weight: 123.8 kg 126.2 kg    Examination:   Constitutional: NAD, AAOx3 HEENT: conjunctivae and lids normal, EOMI CV: No cyanosis.   RESP: normal respiratory effort, on RA Extremities: right BKA, left AKA, left forearm amputation, right middle finger wrapped Neuro: II - XII grossly intact.   Psych: Normal mood and affect.  Appropriate judgement and reason  Foley present   Data Reviewed: I have personally reviewed labs and imaging studies  Time spent:  50 minutes  Ellouise Haber, MD [facility] If 7PM-7AM, please contact night-coverage 02/07/2024, 10:25 PM

## 2024-02-07 NOTE — Plan of Care (Signed)
   Problem: Coping: Goal: Ability to adjust to condition or change in health will improve Outcome: Progressing

## 2024-02-07 NOTE — Plan of Care (Signed)

## 2024-02-08 DIAGNOSIS — N400 Enlarged prostate without lower urinary tract symptoms: Secondary | ICD-10-CM | POA: Insufficient documentation

## 2024-02-08 LAB — GLUCOSE, CAPILLARY
Glucose-Capillary: 107 mg/dL — ABNORMAL HIGH (ref 70–99)
Glucose-Capillary: 207 mg/dL — ABNORMAL HIGH (ref 70–99)
Glucose-Capillary: 137 mg/dL — ABNORMAL HIGH (ref 70–99)
Glucose-Capillary: 201 mg/dL — ABNORMAL HIGH (ref 70–99)

## 2024-02-08 MED ORDER — SODIUM CHLORIDE 0.9 % IV SOLN
3.0000 g | Freq: Four times a day (QID) | INTRAVENOUS | Status: DC
  Administered 2024-02-08: 3 g via INTRAVENOUS
  Filled 2024-02-08 (×2): qty 8

## 2024-02-08 MED ORDER — PIPERACILLIN-TAZOBACTAM 3.375 G IVPB
3.3750 g | Freq: Three times a day (TID) | INTRAVENOUS | Status: DC
  Administered 2024-02-08 – 2024-02-09 (×3): 3.375 g via INTRAVENOUS
  Filled 2024-02-08 (×4): qty 50

## 2024-02-08 NOTE — Progress Notes (Signed)
 Physical Therapy Treatment Patient Details Name: Russell [name] MRN: [id number] DOB: 1965/05/29 Today's Date: 02/08/2024   History of Present Illness Russell [name] is a 58 y.o. male with medical history significant of PVD status post right-sided BKA, left-sided AKA, left-sided forearm amputation, GI bleed secondary to peptic ulcer, chronic HFrEF with recovered LVEF, HTN, HLD, BPH, morbid obesity, presented with black tarry stool.    PT Comments  Patient received in bed, he is able to get to sitting edge of bed without external assistance, use of bed rail. Patient is able to perform lateral scoot from bed to recliner with set up assist and min A for safety, balance. He will continue to benefit from skilled PT to improve functional independence, strength and safety with mobility.       If plan is discharge home, recommend the following: A little help with walking and/or transfers;A lot of help with bathing/dressing/bathroom   Can travel by private vehicle     No  Equipment Recommendations  None recommended by PT    Recommendations for Other Services       Precautions / Restrictions Precautions Precautions: Fall Recall of Precautions/Restrictions: Intact Restrictions Weight Bearing Restrictions Per Provider Order: No     Mobility  Bed Mobility Overal bed mobility: Modified Independent Bed Mobility: Supine to Sit     Supine to sit: Modified independent (Device/Increase time)          Transfers Overall transfer level: Needs assistance Equipment used: None Transfers: Bed to chair/wheelchair/BSC            Lateral/Scoot Transfers: Min assist General transfer comment: lateral scoot to bedside chair with L arm dropped    Ambulation/Gait               General Gait Details: does not ambulate at baseline   Stairs             Wheelchair Mobility     Tilt Bed    Modified Rankin (Stroke Patients Only)       Balance Overall balance assessment:  Needs assistance Sitting-balance support: Feet unsupported, Single extremity supported Sitting balance-Leahy Scale: Fair Sitting balance - Comments: R UE support and assist to scoot to his right                                    Communication Communication Communication: No apparent difficulties  Cognition Arousal: Alert Behavior During Therapy: WFL for tasks assessed/performed   PT - Cognitive impairments: No apparent impairments                         Following commands: Intact      Cueing Cueing Techniques: Verbal cues  Exercises      General Comments General comments (skin integrity, edema, etc.): vss      Pertinent Vitals/Pain Pain Assessment Pain Assessment: No/denies pain Pain Intervention(s): Monitored during session    Home Living                          Prior Function            PT Goals (current goals can now be found in the care plan section) Acute Rehab PT Goals Patient Stated Goal: to improve his strength PT Goal Formulation: With patient Time For Goal Achievement: 02/17/24 Potential to Achieve Goals: Good Progress towards PT goals: Progressing  toward goals    Frequency    Min 2X/week      PT Plan      Co-evaluation PT/OT/SLP Co-Evaluation/Treatment: Yes Reason for Co-Treatment: For patient/therapist safety;To address functional/ADL transfers PT goals addressed during session: Mobility/safety with mobility;Balance OT goals addressed during session: ADL's and self-care      AM-PAC PT 6 Clicks Mobility   Outcome Measure  Help needed turning from your back to your side while in a flat bed without using bedrails?: A Little Help needed moving from lying on your back to sitting on the side of a flat bed without using bedrails?: A Little Help needed moving to and from a bed to a chair (including a wheelchair)?: A Little Help needed standing up from a chair using your arms (e.g., wheelchair or bedside  chair)?: Total Help needed to walk in hospital room?: Total Help needed climbing 3-5 steps with a railing? : Total 6 Click Score: 12    End of Session   Activity Tolerance: Patient tolerated treatment well Patient left: in chair;with call bell/phone within reach;with chair alarm set Nurse Communication: Mobility status PT Visit Diagnosis: Other abnormalities of gait and mobility (R26.89)     Time: 8941-8883 PT Time Calculation (min) (ACUTE ONLY): 18 min  Charges:    $Therapeutic Activity: 8-22 mins PT General Charges $$ ACUTE PT VISIT: 1 Visit                     Andie Mortimer, PT, GCS 02/08/24,11:48 AM

## 2024-02-08 NOTE — Progress Notes (Signed)
 Occupational Therapy Treatment Patient Details Name: Russell [name] MRN: [id number] DOB: 06/10/1965 Today's Date: 02/08/2024   History of present illness Russell [name] is a 58 y.o. male with medical history significant of PVD status post right-sided BKA, left-sided AKA, left-sided forearm amputation, GI bleed secondary to peptic ulcer, chronic HFrEF with recovered LVEF, HTN, HLD, BPH, morbid obesity, presented with black tarry stool.   OT comments  Chart reviewed to date, pt greeted semi supine in bed, agreeable to OT tx session targeting improving functional activity tolerance in prep for ADL tasks. Pt is making progress towards goals, as evidenced by performing bed mobility with MIN A, lateral scoot transfers with MIN A +1-2 for safety. SET UP for grooming tasks. Pt continues to perform ADL/functional mobility below PLOF, will benefit from acute OT to address functional deficits and to facilitate opimtal ADL/functional mobilty performance. OT will continue to follow.       If plan is discharge home, recommend the following:  Help with stairs or ramp for entrance;A lot of help with bathing/dressing/bathroom   Equipment Recommendations  Hospital bed    Recommendations for Other Services      Precautions / Restrictions Precautions Precautions: Fall Recall of Precautions/Restrictions: Intact Restrictions Weight Bearing Restrictions Per Provider Order: No       Mobility Bed Mobility Overal bed mobility: Needs Assistance Bed Mobility: Supine to Sit     Supine to sit: Min assist, HOB elevated          Transfers Overall transfer level: Needs assistance   Transfers: Bed to chair/wheelchair/BSC            Lateral/Scoot Transfers: Min assist General transfer comment: lateral scoot to bedside chair with L arm dropped     Balance Overall balance assessment: Needs assistance Sitting-balance support: Feet unsupported, Single extremity supported Sitting balance-Leahy  Scale: Fair                                     ADL either performed or assessed with clinical judgement   ADL Overall ADL's : Needs assistance/impaired     Grooming: Set up;Sitting;Oral care;Wash/dry face                   Toilet Transfer: Contact guard assist;+2 for physical assistance;Minimal assistance Toilet Transfer Details (indicate cue type and reason): lateral scoot to edge of bed Toileting- Clothing Manipulation and Hygiene: Maximal assistance Toileting - Clothing Manipulation Details (indicate cue type and reason): anticipate            Extremity/Trunk Assessment              Vision       Perception     Praxis     Communication Communication Communication: No apparent difficulties   Cognition Arousal: Alert Behavior During Therapy: WFL for tasks assessed/performed Cognition: No apparent impairments                               Following commands: Intact        Cueing   Cueing Techniques: Verbal cues  Exercises Other Exercises Other Exercises: edu pt/wife re role of rehab    Shoulder Instructions       General Comments vss    Pertinent Vitals/ Pain       Pain Assessment Pain Assessment: Faces Faces Pain Scale: Hurts a little bit  Pain Location: stomach Pain Descriptors / Indicators: Grimacing, Discomfort Pain Intervention(s): Repositioned, Monitored during session  Home Living                                          Prior Functioning/Environment              Frequency  Min 2X/week        Progress Toward Goals  OT Goals(current goals can now be found in the care plan section)  Progress towards OT goals: Progressing toward goals  Acute Rehab OT Goals Time For Goal Achievement: 02/17/24  Plan      Co-evaluation    PT/OT/SLP Co-Evaluation/Treatment: Yes Reason for Co-Treatment: Complexity of the patient's impairments (multi-system involvement)   OT goals  addressed during session: ADL's and self-care      AM-PAC OT 6 Clicks Daily Activity     Outcome Measure   Help from another person eating meals?: None Help from another person taking care of personal grooming?: None Help from another person toileting, which includes using toliet, bedpan, or urinal?: A Lot Help from another person bathing (including washing, rinsing, drying)?: A Lot Help from another person to put on and taking off regular upper body clothing?: A Little Help from another person to put on and taking off regular lower body clothing?: A Lot 6 Click Score: 17    End of Session    OT Visit Diagnosis: Other abnormalities of gait and mobility (R26.89);Muscle weakness (generalized) (M62.81)   Activity Tolerance Patient tolerated treatment well   Patient Left in chair;with call bell/phone within reach;with chair alarm set   Nurse Communication Mobility status        Time: 8942-8872 OT Time Calculation (min): 30 min  Charges: OT General Charges $OT Visit: 1 Visit OT Treatments $Therapeutic Activity: 8-22 mins  Therisa Sheffield, OTD OTR/L  02/08/24, 11:41 AM

## 2024-02-08 NOTE — Progress Notes (Signed)
 PROGRESS NOTE  Russell [name]  FMW:[id number] DOB: Mar 12, 1965 DOA: 01/30/2024 PCP: Eliverto [name], MD   Hospital course / significant events:   Russell [name], 58 y.o. male with medical history significant of PVD status post right-sided BKA, left-sided AKA, left-sided forearm amputation, GI bleed secondary to peptic ulcer, chronic HFrEF with recovered LVEF, HTN, HLD, BPH, morbid obesity, presented with black tarry stool.   HPI: poorly controlled chronic back pain and  tailbone pain for which he has been taking OTC BC powders and last 3 days he has seen a large amount of black tarry stool but denied any epigastric pain no N/V, no CP, no lightheadedness or palpitations.   12/01: to ED. Hgb 6.0 (compared to 11.3 a month prior). GI consulted, PRBC transfusion underway  12/02: 4 unit PRBC and hypotensive. CTA ordered. Vasc consult. Afib RVR, cardiology consult. ICU consult and they have assumed care on pressors  12/10: I assumed care of patient. S/s cultures came back for rare Pseudomonas aeruginosa. Levofloxacin is considered however, EKG on 12/2 showed qtc of 492, which is increased from 454 on 11/25. We will check EKG today and if Qtc is improved, we will discharge with levofloxacin 750 mg PO.  Assessment & Plan:   Principal Problem:   GI bleed Active Problems:   Acute blood loss anemia   Diabetes (HCC)   Upper GI bleeding   Morbid obesity (HCC)   HLD (hyperlipidemia)   Depression with anxiety   Melena   DU (duodenal ulcer)   Blood in stool   Duodenal ulcer   Blood loss anemia   Bipolar disorder (HCC)   Finger osteomyelitis (HCC)   BPH (benign prostatic hyperplasia)   Assessment and Plan:  * GI bleed Currently stable 12/8: Patient is status post EGD with duodenal clips 12/10: Appears to be stable at this time.  CBC was not ordered on morning labs.  Patient continued on Protonix  40 mg p.o. twice daily.  Acute blood loss anemia Per chart review, patient received a  total of 4 units of blood during this hospitalization. 12/9 labs: Hemoglobin is stable at 8.2 12/10: No current signs of active infection at this time  Diabetes [name]) Insulin -dependent diabetes mellitus Home long-acting insulin , 30 units nightly Insulin  SSI  Depression with anxiety Duloxetine  90 mg daily  HLD (hyperlipidemia) Atorvastatin  40 mg daily   Morbid obesity (HCC) This complicates overall care and prognosis.   BPH (benign prostatic hyperplasia) Finasteride  5 mg daily, tamsulosin  0.8 mg p.o. daily  Finger osteomyelitis (HCC) At this time, goal is conservative management to preserve function Continue linezolid  600 mg p.o. twice daily 12/10: Sensitivity came back with rare Pseudomonas aeruginosa Levofloxacin 750 mg p.o. was considered however QTc was noted to be trending up. EKG ordered to assess QTc and if improved from before, levofloxacin can be ordered on discharge  Bipolar disorder (HCC) Home duloxetine  90 mg daily resume  DU (duodenal ulcer) Status post clip via EGD on 12/8  DVT prophylaxis: Enoxaparin  Code Status: DNR/DNI Family Communication: No Disposition Plan: Pending EKG, there is a bed ready per TOC Level of care: Med-Surg  Consultants:  Gastroenterology, PT, OT, TOC  Procedures:  EGD 01/31/2024: Central line placement 12/8: Central line discontinued  Antimicrobials: 12/9: linezolid  600 mg PO BID   Subjective:  At bedside, patient able to tell me his first and last name, his age, the current calendar year, his current location.  Patient is awaiting final sensitivity/specificity and micro culture for final antibiotic recommendation for  his right finger.  Objective: Vitals:   02/07/24 1706 02/07/24 1952 02/08/24 0317 02/08/24 0800  BP: 124/70 130/71 (!) 115/41 128/64  Pulse: 61 63 (!) 58 (!) 55  Resp: 16 18 19 17   Temp: 98.2 F (36.8 C) 98.8 F (37.1 C) 99.1 F (37.3 C) 98.2 F (36.8 C)  TempSrc: Oral Oral Oral   SpO2: 97% 98% 97%  94%  Weight:      Height:        Intake/Output Summary (Last 24 hours) at 02/08/2024 1424 Last data filed at 02/08/2024 0900 Gross per 24 hour  Intake 716 ml  Output 3450 ml  Net -2734 ml   Filed Weights   01/30/24 0813 02/04/24 0000  Weight: 123.8 kg 126.2 kg   Examination:  General exam: Appears calm and comfortable  Respiratory system: Clear to auscultation. Respiratory effort normal. Cardiovascular system: S1 & S2 heard, RRR. No JVD, murmurs, rubs, gallops or clicks. No pedal edema. Gastrointestinal system: Morbidly obese abdomen, abdomen soft and nontender. No organomegaly or masses felt. Normal bowel sounds heard. GU: Condom catheter in place. Central nervous system: Alert and oriented. No focal neurological deficits. Extremities: Left arm amputation, left AKA, right BKA Skin: No rashes, lesions or ulcers Psychiatry: Judgement and insight appear normal. Mood & affect appropriate.   Data Reviewed: I have personally reviewed following labs and imaging studies  CBC: Recent Labs  Lab 02/02/24 0504 02/02/24 0813 02/02/24 1315 02/02/24 2144 02/03/24 0425 02/04/24 0402 02/07/24 0443  WBC 11.8*  --   --   --  5.7 5.2 4.4  HGB 7.4*   < > 7.1* 7.9* 8.0* 7.8* 8.2*  HCT 23.0*   < > 22.5* 25.2* 24.2* 24.3* 26.6*  MCV 82.1  --   --   --  80.1 82.1 85.5  PLT 208  --   --   --  178 196 211   < > = values in this interval not displayed.   Basic Metabolic Panel: Recent Labs  Lab 02/02/24 0504 02/03/24 0425 02/04/24 0402 02/07/24 0443  NA 138 135 134* 138  K 3.3* 4.0 4.2 4.2  CL 106 104 103 102  CO2 25 27 28 29   GLUCOSE 125* 145* 143* 139*  BUN 20 14 13 15   CREATININE 0.67 0.60* 0.75 0.62  CALCIUM  7.9* 8.0* 7.9* 7.9*  MG 2.1 2.1  --  2.1  PHOS 3.1  --   --   --    GFR: Estimated Creatinine Clearance: 134.6 mL/min (by C-G formula based on SCr of 0.62 mg/dL).  BNP (last 3 results) Recent Labs    01/31/24 1135  PROBNP 104.0   CBG: Recent Labs  Lab  02/07/24 1201 02/07/24 1717 02/07/24 2144 02/08/24 0803 02/08/24 1156  GLUCAP 160* 163* 169* 107* 207*   Recent Results (from the past 240 hours)  Culture, blood (Routine X 2) w Reflex to ID Panel     Status: None   Collection Time: 01/31/24  7:21 PM   Specimen: BLOOD  Result Value Ref Range Status   Specimen Description BLOOD BLOOD RIGHT HAND  Final   Special Requests   Final    AEROBIC BOTTLE ONLY Blood Culture results may not be optimal due to an inadequate volume of blood received in culture bottles   Culture   Final    NO GROWTH 5 DAYS Performed at [facility], [address], [place], [place] [address]    Report Status 02/05/2024 FINAL  Final  Culture, blood (Routine  X 2) w Reflex to ID Panel     Status: None   Collection Time: 02/01/24  2:39 AM   Specimen: BLOOD  Result Value Ref Range Status   Specimen Description BLOOD LEFT ANTECUBITAL  Final   Special Requests   Final    BOTTLES DRAWN AEROBIC AND ANAEROBIC Blood Culture adequate volume   Culture   Final    NO GROWTH 5 DAYS Performed at [facility], [address], [place], [place] [address]    Report Status 02/06/2024 FINAL  Final  MRSA Next Gen by PCR, Nasal     Status: None   Collection Time: 02/02/24  5:08 PM   Specimen: Nasal Mucosa; Nasal Swab  Result Value Ref Range Status   MRSA by PCR Next Gen NOT DETECTED NOT DETECTED Final    Comment: (NOTE) The GeneXpert MRSA Assay (FDA approved for NASAL specimens only), is one component of a comprehensive MRSA colonization surveillance program. It is not intended to diagnose MRSA infection nor to guide or monitor treatment for MRSA infections. Test performance is not FDA approved in patients less than 53 years old. Performed at [facility], [address] [place], [place] [address]   Aerobic/Anaerobic Culture w Gram Stain (surgical/deep wound)     Status: None (Preliminary result)   Collection Time: 02/03/24  5:01 PM   Specimen:  Joint, Finger; Wound  Result Value Ref Range Status   Specimen Description   Final    FINGER Performed at [facility], [address] [place], [place] [address]    Special Requests   Final    right finger Performed at [facility], [address] [place], [place] [address]    Gram Stain   Final    FEW WBC SEEN FEW GRAM POSITIVE COCCI RARE GRAM NEGATIVE RODS    Culture   Final    FEW STAPHYLOCOCCUS AUREUS RARE PSEUDOMONAS AERUGINOSA MODERATE ENTEROCOCCUS FAECALIS SUSCEPTIBILITIES TO FOLLOW NO ANAEROBES ISOLATED Performed at [facility], [address] [place], [place] [address]    Report Status PENDING  Incomplete    Scheduled Meds:  sodium chloride    Intravenous Once   amiodarone   400 mg Oral BID   Followed by   NOREEN ON 02/09/2024] amiodarone   200 mg Oral Daily   atorvastatin   40 mg Oral Daily   baclofen   15 mg Oral TID   Chlorhexidine  Gluconate Cloth  6 each Topical Daily   DULoxetine   90 mg Oral Daily   enoxaparin  (LOVENOX ) injection  60 mg Subcutaneous Q24H   finasteride   5 mg Oral Daily   insulin  aspart  0-20 Units Subcutaneous TID WC   insulin  glargine-yfgn  30 Units Subcutaneous QHS   linezolid   600 mg Oral Q12H   melatonin  5 mg Oral QHS   pantoprazole   40 mg Oral BID   polyethylene glycol  34 g Oral BID   pregabalin   200 mg Oral TID   tamsulosin   0.8 mg Oral Daily    LOS: 9 days   Time spent: 50 minutes  Dr. Sherre [facility] If 7PM-7AM, please contact night-coverage 02/08/2024, 2:24 PM

## 2024-02-08 NOTE — Assessment & Plan Note (Signed)
 -  This complicates overall care and prognosis.

## 2024-02-08 NOTE — Assessment & Plan Note (Signed)
 Insulin -dependent diabetes mellitus Home long-acting insulin , 30 units nightly Insulin  SSI

## 2024-02-08 NOTE — Assessment & Plan Note (Signed)
 Home duloxetine  90 mg daily resume

## 2024-02-08 NOTE — Assessment & Plan Note (Addendum)
 Atorvastatin 40 mg daily

## 2024-02-08 NOTE — Assessment & Plan Note (Addendum)
 Currently stable 12/8: Patient is status post EGD with duodenal clips 12/10: Appears to be stable at this time.  CBC was not ordered on morning labs.  Patient continued on Protonix  40 mg p.o. twice daily.

## 2024-02-08 NOTE — Assessment & Plan Note (Signed)
 Finasteride  5 mg daily, tamsulosin  0.8 mg p.o. daily

## 2024-02-08 NOTE — Plan of Care (Signed)

## 2024-02-08 NOTE — Progress Notes (Signed)
 INFECTIOUS DISEASE PROGRESS NOTE  Brief note- pt not seen  Assessment/Plan: Russell [name] is a 58 y.o. 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 Has seen ortho and may need amputation but pt hoping to preserve function.   12/10 cx with Staph aureus enterococcus, pseudomonas.    Recommendations Now that pseudomonas noted change back to zosyn  pending sensis Cont zyvox  Once final cxs back will try to come up with oral regimen  Thank you very much for the consult. Will follow with you.  Russell [name]   02/08/2024, 3:16 PM

## 2024-02-08 NOTE — Assessment & Plan Note (Signed)
 Status post clip via EGD on 12/8

## 2024-02-08 NOTE — Assessment & Plan Note (Signed)
 Per chart review, patient received a total of 4 units of blood during this hospitalization. 12/9 labs: Hemoglobin is stable at 8.2 12/10: No current signs of active infection at this time

## 2024-02-08 NOTE — Assessment & Plan Note (Signed)
 Duloxetine  90 mg daily

## 2024-02-08 NOTE — Assessment & Plan Note (Signed)
 At this time, goal is conservative management to preserve function Continue linezolid  600 mg p.o. twice daily 12/10: Sensitivity came back with rare Pseudomonas aeruginosa Levofloxacin 750 mg p.o. was considered however QTc was noted to be trending up. EKG ordered to assess QTc and if improved from before, levofloxacin can be ordered on discharge

## 2024-02-09 LAB — AEROBIC/ANAEROBIC CULTURE W GRAM STAIN (SURGICAL/DEEP WOUND)

## 2024-02-09 LAB — GLUCOSE, CAPILLARY
Glucose-Capillary: 129 mg/dL — ABNORMAL HIGH (ref 70–99)
Glucose-Capillary: 249 mg/dL — ABNORMAL HIGH (ref 70–99)

## 2024-02-09 MED ORDER — PANTOPRAZOLE SODIUM 40 MG PO TBEC: 40.0000 mg | DELAYED_RELEASE_TABLET | Freq: Two times a day (BID) | ORAL | 0 refills | Status: AC

## 2024-02-09 MED ORDER — INSULIN ASPART 100 UNIT/ML IJ SOLN: 0.0000 [IU] | Freq: Three times a day (TID) | INTRAMUSCULAR | 11 refills | Status: AC

## 2024-02-09 MED ORDER — OXYCODONE-ACETAMINOPHEN 5-325 MG PO TABS: 1.0000 | ORAL_TABLET | Freq: Four times a day (QID) | ORAL | 0 refills | Status: AC | PRN

## 2024-02-09 MED ORDER — OXYCODONE-ACETAMINOPHEN 5-325 MG PO TABS: 1.0000 | ORAL_TABLET | Freq: Four times a day (QID) | ORAL | 0 refills | Status: DC | PRN

## 2024-02-09 MED ORDER — DOXYCYCLINE HYCLATE 100 MG PO CAPS: 100.0000 mg | ORAL_CAPSULE | Freq: Two times a day (BID) | ORAL | 0 refills | Status: AC

## 2024-02-09 MED ORDER — INSULIN GLARGINE-YFGN 100 UNIT/ML ~~LOC~~ SOLN: 30.0000 [IU] | Freq: Every day | SUBCUTANEOUS | 11 refills | Status: AC

## 2024-02-09 MED ORDER — CIPROFLOXACIN HCL 250 MG PO TABS: 750.0000 mg | ORAL_TABLET | Freq: Two times a day (BID) | ORAL | 0 refills | Status: AC

## 2024-02-09 NOTE — Progress Notes (Signed)
 Room 412 given report to [facility]

## 2024-02-09 NOTE — Plan of Care (Signed)
°  Problem: Education: Goal: Ability to describe self-care measures that may prevent or decrease complications (Diabetes Survival Skills Education) will improve Outcome: Progressing Goal: Individualized Educational Video(s) Outcome: Progressing   Problem: Coping: Goal: Ability to adjust to condition or change in health will improve Outcome: Progressing   Problem: Health Behavior/Discharge Planning: Goal: Ability to identify and utilize available resources and services will improve Outcome: Progressing Goal: Ability to manage health-related needs will improve Outcome: Progressing   Problem: Fluid Volume: Goal: Ability to maintain a balanced intake and output will improve Outcome: Progressing   Problem: Tissue Perfusion: Goal: Adequacy of tissue perfusion will improve Outcome: Progressing   Problem: Education: Goal: Knowledge of General Education information will improve Description: Including pain rating scale, medication(s)/side effects and non-pharmacologic comfort measures Outcome: Progressing   Problem: Health Behavior/Discharge Planning: Goal: Ability to manage health-related needs will improve Outcome: Progressing

## 2024-02-09 NOTE — Discharge Summary (Addendum)
 Physician Discharge Summary   Patient: Russell [name] MRN: [id number] DOB: [birth year]  Admit date:     01/30/2024  Discharge date: 02/09/2024  Discharge Physician: Nena Rebel   PCP: Eliverto [name], MD   Recommendations at discharge:   Continue taking Cipro  and doxycycline  for 28 days. Continue Protonix  40 mg twice daily for 45 days and follow-up with GI before then. Continue with amiodarone  and follow-up with cardiologist as outpatient within 4 weeks Weekly EKG to make sure he does not have a QTc prolongation while on Cipro . Needs to see infectious disease before completing the treatment with Cipro  and doxycycline  for 28 days Patient had 2 L of urine when he came into ED, placed Foley, needs outpatient urology follow-up for Foley catheter to be removed.  Discharge Diagnoses: Principal Problem:   GI bleed Active Problems:   Acute blood loss anemia   Diabetes (HCC)   Upper GI bleeding   Morbid obesity (HCC)   HLD (hyperlipidemia)   Depression with anxiety   Melena   DU (duodenal ulcer)   Blood in stool   Duodenal ulcer   Blood loss anemia   Bipolar disorder (HCC)   Finger osteomyelitis (HCC)   BPH (benign prostatic hyperplasia)  Resolved Problems:   * No resolved hospital problems. [facility] Course: Hospital course / significant events:   Russell [name], 58 y.o. male with medical history significant of PVD status post right-sided BKA, left-sided AKA, left-sided forearm amputation, GI bleed secondary to peptic ulcer, chronic HFrEF with recovered LVEF, HTN, HLD, BPH, morbid obesity, presented with black tarry stool.   HPI: poorly controlled chronic back pain and  tailbone pain for which he has been taking OTC BC powders and last 3 days he has seen a large amount of black tarry stool but denied any epigastric pain no N/V, no CP, no lightheadedness or palpitations.   12/01: to ED. Hgb 6.0 (compared to 11.3 a month prior). GI consulted, PRBC transfusion underway   12/02: 4 unit PRBC and hypotensive. CTA ordered. Vasc consult. Afib RVR, cardiology consult. ICU consult and they have assumed care on pressors  Consultants:  Gastroenterology  Cardiology ICU Vascular surgery  Procedures/Surgeries: Central line place      Assessment and Plan:  Acute GI bleed Currently stable 12/8: Patient is status post EGD with duodenal clips 12/10: Appears to be stable at this time.   12/11: No bleeding Patient continued on Protonix  40 mg p.o. twice daily until seen by GI  Acute blood loss anemia Per chart review, patient received a total of 4 units of blood during this hospitalization. 12/9 labs: Hemoglobin is stable at 8.2 12/10: No current signs of active infection at this time  Diabetes (HCC) Insulin -dependent diabetes mellitus Home long-acting insulin , 30 units nightly Insulin  SSI  Depression with anxiety Duloxetine  90 mg daily  HLD (hyperlipidemia) Atorvastatin  40 mg daily   Morbid obesity (HCC) This complicates overall care and prognosis.   BPH (benign prostatic hyperplasia) Finasteride  5 mg daily, tamsulosin  0.8 mg p.o. daily  Finger osteomyelitis (HCC) At this time, goal is conservative management to preserve function Continue linezolid  600 mg p.o. twice daily 12/10: Sensitivity came back with rare Pseudomonas aeruginosa ID advised to continue Cipro  and Doxy for 4 weeks.  Need to follow-up with ID before completion of treatment.  Bipolar disorder (HCC) Home duloxetine  90 mg daily resume  DU (duodenal ulcer) Status post clip via EGD on 12/8  Patient home medication list shows Lasix  80 mg but  looks like patient was not taking.  That has not been continued.  If needed patient needs to be reevaluated and add Lasix .       Consultants: ID, cardiology Procedures performed: None Disposition: Skilled nursing facility Diet recommendation:  Carb modified diet DISCHARGE MEDICATION: Allergies as of 02/09/2024       Reactions    Fluoxetine    Other reaction(s): Hallucination   Shellfish Allergy Hives   Sulfa Antibiotics Hives   Vancomycin     Other reaction(s): Red Man Syndrome   Metformin Nausea Only        Medication List     STOP taking these medications    amoxicillin -clavulanate 875-125 MG tablet Commonly known as: AUGMENTIN    diphenhydrAMINE  25 mg capsule Commonly known as: BENADRYL    furosemide  80 MG tablet Commonly known as: LASIX    insulin  degludec 200 UNIT/ML FlexTouch Pen Commonly known as: TRESIBA   nitrofurantoin  50 MG capsule Commonly known as: MACRODANTIN    oxyCODONE  15 MG immediate release tablet Commonly known as: ROXICODONE    Toujeo  SoloStar 300 UNIT/ML Solostar Pen Generic drug: insulin  glargine (1 Unit Dial) Replaced by: insulin  glargine-yfgn 100 UNIT/ML injection       TAKE these medications    acetaminophen  325 MG tablet Commonly known as: TYLENOL  Take 2 tablets (650 mg total) by mouth every 6 (six) hours as needed for mild pain (pain score 1-3) (or Fever >/= 101).   amiodarone  200 MG tablet Commonly known as: PACERONE  Take 1 tablet (200 mg total) by mouth daily. Start taking on: February 10, 2024   aspirin  EC 81 MG tablet Take 81 mg by mouth.  Take 1 tablet (81 mg total) by mouth once daily   atorvastatin  40 MG tablet Commonly known as: LIPITOR Take 40 mg by mouth daily.   baclofen  10 MG tablet Commonly known as: LIORESAL  Take 15 mg by mouth every 8 (eight) hours.   bisacodyl  10 MG suppository Commonly known as: DULCOLAX Place 10 mg rectally daily as needed for moderate constipation.   buPROPion  150 MG 24 hr tablet Commonly known as: WELLBUTRIN  XL Take 1 tablet (150 mg total) by mouth daily.   cetirizine 10 MG tablet Commonly known as: ZYRTEC Take 10 mg by mouth daily as needed for allergies.   ciprofloxacin  250 MG tablet Commonly known as: CIPRO  Take 3 tablets (750 mg total) by mouth 2 (two) times daily for 28 days.   diclofenac Sodium 1 %  Gel Commonly known as: VOLTAREN Apply 2 g topically daily as needed (for pain).   doxycycline  100 MG capsule Commonly known as: VIBRAMYCIN  Take 1 capsule (100 mg total) by mouth 2 (two) times daily for 28 days.   DULoxetine  30 MG capsule Commonly known as: CYMBALTA  Take 90 mg by mouth daily.   finasteride  5 MG tablet Commonly known as: PROSCAR  Take 5 mg by mouth daily.   gabapentin  600 MG tablet Commonly known as: NEURONTIN  Take 600 mg by mouth 3 (three) times daily.   HumuLIN  R 100 UNIT/ML injection Generic drug: insulin  regular Inject 0.1 mLs (10 Units total) into the skin 3 (three) times daily with meals.   insulin  aspart 100 UNIT/ML injection Commonly known as: novoLOG  Inject 0-20 Units into the skin 3 (three) times daily with meals.   insulin  glargine-yfgn 100 UNIT/ML injection Commonly known as: SEMGLEE  Inject 0.3 mLs (30 Units total) into the skin at bedtime. Replaces: Toujeo  SoloStar 300 UNIT/ML Solostar Pen   insulin  lispro 100 UNIT/ML KwikPen Commonly known as: HUMALOG Inject 20  Units into the skin 3 (three) times daily.   loperamide  2 MG tablet Commonly known as: IMODIUM  A-D Take 2 mg by mouth as needed for diarrhea or loose stools.   melatonin 5 MG Tabs Take 1 tablet (5 mg total) by mouth at bedtime.   metoprolol  succinate 50 MG 24 hr tablet Commonly known as: TOPROL -XL Take 50 mg by mouth at bedtime.   Mounjaro 2.5 MG/0.5ML Pen Generic drug: tirzepatide Inject 2.5 mg into the skin once a week.   Movantik 25 MG Tabs tablet Generic drug: naloxegol oxalate Take 25 mg by mouth daily.   mupirocin  ointment 2 % Commonly known as: BACTROBAN  Apply to penis topically as needed.   oxyCODONE -acetaminophen  5-325 MG tablet Commonly known as: PERCOCET/ROXICET Take 1 tablet by mouth every 6 (six) hours as needed for moderate pain (pain score 4-6) or severe pain (pain score 7-10).   oxymetazoline  0.05 % nasal spray Commonly known as: AFRIN Place 1 spray  into both nostrils 2 (two) times daily as needed for congestion.   pantoprazole  40 MG tablet Commonly known as: PROTONIX  Take 1 tablet (40 mg total) by mouth 2 (two) times daily.   polyethylene glycol 17 g packet Commonly known as: MIRALAX  / GLYCOLAX  Take 17 g by mouth daily as needed.   potassium chloride  10 MEQ CR capsule Commonly known as: MICRO-K  Take by mouth 2 (two) times daily. :1 Tablet(s) By Mouth Twice Daily   pregabalin  150 MG capsule Commonly known as: LYRICA  Take 150 mg by mouth 3 (three) times daily.   senna 8.6 MG Tabs tablet Commonly known as: SENOKOT Take 2 tablets by mouth daily.   senna-docusate 8.6-50 MG tablet Commonly known as: Senokot-S Take 2 tablets by mouth 2 (two) times daily.   tamsulosin  0.4 MG Caps capsule Commonly known as: FLOMAX  Take 0.8 mg by mouth daily.        Contact information for follow-up providers     Clarisa Kung, PA-C. Go in 1 week(s).   Contact information: [address] [facility] [place] [address] [phone]              Contact information for after-discharge care     Destination     [facility] .   Service: Skilled Nursing Contact information: [address] [place]  [address] [phone]                    Discharge Exam: Fredricka Weights   01/30/24 0813 02/04/24 0000  Weight: 123.8 kg 126.2 kg   Seen and examined at bedside. Patient is alert awake and oriented. Neck is supple Lungs are clear to auscultation Cardiology: S1-S2 GI: Bowel sounds are present morbid obese abdomen Extremities: No lower extremity edema Neurological: Alert awake and oriented, no focal logical deficit identified  Condition at discharge: good  The results of significant diagnostics from this hospitalization (including imaging, microbiology, ancillary and laboratory) are listed below for reference.   Imaging Studies: DG Hand Complete Right Result Date:  02/02/2024 EXAM: 3 or more VIEW(S) XRAY OF THE RIGHT HAND 02/02/2024 03:11:00 PM COMPARISON: None available. CLINICAL HISTORY: Wound dehiscence FINDINGS: BONES AND JOINTS: Osteolytic changes of the distal phalanx of the right index finger. SOFT TISSUES: Soft tissue swelling and dorsal and anterior ulceration associated with the right index finger. Soft tissue ulceration at the tip of the right middle finger. IMPRESSION: 1. Osteolytic changes of the distal phalanx of the right index finger with associated soft tissue swelling  and dorsal and anterior ulceration. Findings consistent with osteomyelitis. 2. Soft tissue ulceration at the tip of the right middle finger without underlying osteolytic change. Electronically signed by: Franky Stanford MD 02/02/2024 08:14 PM EST RP Workstation: [id number]   ECHOCARDIOGRAM COMPLETE Result Date: 02/01/2024    ECHOCARDIOGRAM REPORT   Patient Name:   MOHID FURUYA Date of Exam: 02/01/2024 Medical Rec #:  [id number]       Height:       71.0 in Accession #:    [id number]      Weight:       273.0 lb Date of Birth:  1966/02/10        BSA:          2.407 m Patient Age:    58 years        BP:           129/59 mmHg Patient Gender: M               HR:           74 bpm. Exam Location:  [facility] Procedure: 2D Echo, Cardiac Doppler and Color Doppler (Both Spectral and Color            Flow Doppler were utilized during procedure). Indications:     Atrial Fibrillation I48.91  History:         Patient has no prior history of Echocardiogram examinations.                  CHF; Risk Factors:Diabetes.  Sonographer:     Christopher Furnace Referring Phys:  [id number] LANETA BLUNT Diagnosing Phys: Dwayne D Callwood MD  Sonographer Comments: Patient is obese and suboptimal apical window. IMPRESSIONS  1. Left ventricular ejection fraction, by estimation, is 55 to 60%. The left ventricle has normal function. The left ventricle has no regional wall motion abnormalities. Left ventricular diastolic parameters are  consistent with Grade II diastolic dysfunction (pseudonormalization).  2. Right ventricular systolic function is normal. The right ventricular size is normal.  3. The mitral valve is normal in structure. No evidence of mitral valve regurgitation.  4. The aortic valve is normal in structure. Aortic valve regurgitation is not visualized. FINDINGS  Left Ventricle: Left ventricular ejection fraction, by estimation, is 55 to 60%. The left ventricle has normal function. The left ventricle has no regional wall motion abnormalities. Strain was performed and the global longitudinal strain is indeterminate. The left ventricular internal cavity size was normal in size. There is borderline concentric left ventricular hypertrophy. Left ventricular diastolic parameters are consistent with Grade II diastolic dysfunction (pseudonormalization). Right Ventricle: The right ventricular size is normal. No increase in right ventricular wall thickness. Right ventricular systolic function is normal. Left Atrium: Left atrial size was normal in size. Right Atrium: Right atrial size was normal in size. Pericardium: There is no evidence of pericardial effusion. Mitral Valve: The mitral valve is normal in structure. No evidence of mitral valve regurgitation. MV peak gradient, 5.0 mmHg. The mean mitral valve gradient is 2.0 mmHg. Tricuspid Valve: The tricuspid valve is normal in structure. Tricuspid valve regurgitation is trivial. Aortic Valve: The aortic valve is normal in structure. Aortic valve regurgitation is not visualized. Aortic valve mean gradient measures 10.0 mmHg. Aortic valve peak gradient measures 19.4 mmHg. Aortic valve area, by VTI measures 2.89 cm. Pulmonic Valve: The pulmonic valve was normal in structure. Pulmonic valve regurgitation is not visualized. Aorta: The ascending aorta was not well visualized. IAS/Shunts: No atrial level shunt  detected by color flow Doppler. Additional Comments: 3D was performed not requiring image  post processing on an independent workstation and was indeterminate.  LEFT VENTRICLE PLAX 2D LVIDd:         4.90 cm   Diastology LVIDs:         3.40 cm   LV e' medial:    10.70 cm/s LV PW:         1.10 cm   LV E/e' medial:  9.7 LV IVS:        1.20 cm   LV e' lateral:   7.72 cm/s LVOT diam:     2.20 cm   LV E/e' lateral: 13.5 LV SV:         109 LV SV Index:   45 LVOT Area:     3.80 cm LV IVRT:       79 msec  RIGHT VENTRICLE RV Basal diam:  4.20 cm     PULMONARY VEINS RV Mid diam:    1.80 cm     Diastolic Velocity: 31.80 cm/s RV S prime:     20.90 cm/s  S/D Velocity:       1.30 TAPSE (M-mode): 3.7 cm      Systolic Velocity:  42.90 cm/s LEFT ATRIUM           Index        RIGHT ATRIUM           Index LA diam:      2.90 cm 1.20 cm/m   RA Area:     18.00 cm LA Vol (A4C): 79.1 ml 32.87 ml/m  RA Volume:   38.40 ml  15.96 ml/m  AORTIC VALVE AV Area (Vmax):    2.75 cm AV Area (Vmean):   2.74 cm AV Area (VTI):     2.89 cm AV Vmax:           220.00 cm/s AV Vmean:          143.000 cm/s AV VTI:            0.377 m AV Peak Grad:      19.4 mmHg AV Mean Grad:      10.0 mmHg LVOT Vmax:         159.00 cm/s LVOT Vmean:        103.000 cm/s LVOT VTI:          0.287 m LVOT/AV VTI ratio: 0.76  AORTA Ao Root diam: 3.00 cm MITRAL VALVE                TRICUSPID VALVE MV Area (PHT): 3.19 cm     TR Peak grad:   6.6 mmHg MV Area VTI:   2.91 cm     TR Vmax:        128.00 cm/s MV Peak grad:  5.0 mmHg MV Mean grad:  2.0 mmHg     SHUNTS MV Vmax:       1.12 m/s     Systemic VTI:  0.29 m MV Vmean:      71.5 cm/s    Systemic Diam: 2.20 cm MV Decel Time: 238 msec MV E velocity: 104.00 cm/s MV A velocity: 85.90 cm/s MV E/A ratio:  1.21 Dwayne JONETTA Lovelace MD Electronically signed by Cara JONETTA Lovelace MD Signature Date/Time: 02/01/2024/1:24:12 PM    Final    CT Angio Abd/Pel w/ and/or w/o Result Date: 01/31/2024 CLINICAL DATA:  Persistent GI bleed with melena. EXAM: CT ANGIOGRAPHY ABDOMEN AND PELVIS WITH CONTRAST AND WITHOUT  CONTRAST TECHNIQUE:  Multidetector CT imaging of the abdomen and pelvis was performed using the standard protocol during bolus administration of intravenous contrast. Multiplanar reconstructed images and MIPs were obtained and reviewed to evaluate the vascular anatomy. RADIATION DOSE REDUCTION: This exam was performed according to the departmental dose-optimization program which includes automated exposure control, adjustment of the mA and/or kV according to patient size and/or use of iterative reconstruction technique. CONTRAST:  OMNIPAQUE  IOHEXOL  350 MG/ML SOLN COMPARISON:  CT abdomen pelvis 10/24/2022 FINDINGS: VASCULAR Aorta: Normal abdominal aorta with normal caliber and no significant atherosclerosis. No dissection. Celiac: Normally patent. Normally patent branch vessels and branch anatomy. SMA: Normally patent. Renals: Normally patent bilateral single renal arteries. IMA: Normally patent IMA. Inflow: Normally patent bilateral iliac arteries. Proximal Outflow: Normally patent bilateral common femoral arteries and femoral bifurcations. Veins: Venous phase imaging demonstrates normal patency of venous structures in the abdomen and pelvis. No mesenteric venous thrombosis. The portal vein is normally patent. No gastric or esophageal varices identified. Review of the MIP images confirms the above findings. NON-VASCULAR Lower chest: No acute abnormality.  No hiatal hernia. Hepatobiliary: Underlying hepatic steatosis. No overt cirrhosis. No hepatic masses or biliary dilatation. The gallbladder is unremarkable. Pancreas: Diffusely atrophic pancreas without mass or inflammation. No pancreatic ductal dilatation. Spleen: Normal in size without focal abnormality. Adrenals/Urinary Tract: Normal adrenal glands. Kidneys demonstrate no hydronephrosis, focal lesion or calculi bilaterally. The urinary bladder is tremendously distended measuring up to roughly 20.7 x 14 x 15.1 cm for estimated volume of 2275 mL. Stomach/Bowel: Stomach and  duodenum demonstrate no evidence of ulcer, mass or lesion. No evidence of active bleeding into the gastrointestinal tract on arterial or venous phases of imaging. Bowel shows no evidence of obstruction, ileus, inflammation or lesion. The appendix is surgically absent. No free intraperitoneal air. Lymphatic: No enlarged lymph nodes identified. Reproductive: Prostate is unremarkable. Other: Irregular subcutaneous soft tissue density in the subcutaneous fat of the upper right abdomen at roughly the level of the gallbladder may relate to a subcutaneous injection site or focal trauma. No ascites. Musculoskeletal: Degenerative disc disease of the lumbar spine, most severely at L5-S1. IMPRESSION: 1. No evidence of active bleeding into the gastrointestinal tract on arterial or venous phases of imaging. 2. No evidence of peptic ulcer disease of the upper gastrointestinal tract by CT or upper tract varices. 3. Hepatic steatosis. 4. Tremendously distended urinary bladder with estimated volume of 2275 mL. 5. Irregular subcutaneous soft tissue density in the subcutaneous fat of the upper right abdomen at roughly the level of the gallbladder may relate to a subcutaneous injection site or focal trauma. Electronically Signed   By: Marcey Moan M.D.   On: 01/31/2024 18:47   DG Chest Port 1 View Result Date: 01/31/2024 EXAM: 1 VIEW(S) XRAY OF THE CHEST 01/31/2024 03:05:27 PM COMPARISON: None available. CLINICAL HISTORY: Encounter for central line placement. FINDINGS: LINES, TUBES AND DEVICES: Interval placement of right internal jugular catheter with distal tip in expected position of SVC. LUNGS AND PLEURA: No focal pulmonary opacity. No pleural effusion. No pneumothorax. HEART AND MEDIASTINUM: The distal tip of the right internal jugular catheter is in the expected position of the SVC. No acute abnormality of the cardiac and mediastinal silhouettes. BONES AND SOFT TISSUES: No acute osseous abnormality. IMPRESSION: 1. Right  internal jugular catheter with the distal tip in the expected position within the superior vena cava. Electronically signed by: Russell Seip MD 01/31/2024 03:26 PM EST RP Workstation: [id number]   DG Chest Port  1 View Result Date: 01/31/2024 EXAM: 1 VIEW(S) XRAY OF THE CHEST 01/31/2024 10:43:58 AM COMPARISON: 09/06/2023 CLINICAL HISTORY: SOB (shortness of breath); Atrial fibrillation with RVR (HCC) FINDINGS: LUNGS AND PLEURA: Low lung volumes. No focal pulmonary opacity. No pleural effusion. No pneumothorax. HEART AND MEDIASTINUM: No acute abnormality of the cardiac and mediastinal silhouettes. BONES AND SOFT TISSUES: No acute osseous abnormality. IMPRESSION: 1. Low lung volumes, No acute cardiopulmonary process. Electronically signed by: Katheleen Faes MD 01/31/2024 10:58 AM EST RP Workstation: [id number]    Microbiology: Results for orders placed or performed during the hospital encounter of 01/30/24  Culture, blood (Routine X 2) w Reflex to ID Panel     Status: None   Collection Time: 01/31/24  7:21 PM   Specimen: BLOOD  Result Value Ref Range Status   Specimen Description BLOOD BLOOD RIGHT HAND  Final   Special Requests   Final    AEROBIC BOTTLE ONLY Blood Culture results may not be optimal due to an inadequate volume of blood received in culture bottles   Culture   Final    NO GROWTH 5 DAYS Performed at [facility], [address], [place], [place] [address]    Report Status 02/05/2024 FINAL  Final  Culture, blood (Routine X 2) w Reflex to ID Panel     Status: None   Collection Time: 02/01/24  2:39 AM   Specimen: BLOOD  Result Value Ref Range Status   Specimen Description BLOOD LEFT ANTECUBITAL  Final   Special Requests   Final    BOTTLES DRAWN AEROBIC AND ANAEROBIC Blood Culture adequate volume   Culture   Final    NO GROWTH 5 DAYS Performed at [facility], [address] [place], [place] [address]    Report Status 02/06/2024 FINAL  Final  MRSA Next Gen by  PCR, Nasal     Status: None   Collection Time: 02/02/24  5:08 PM   Specimen: Nasal Mucosa; Nasal Swab  Result Value Ref Range Status   MRSA by PCR Next Gen NOT DETECTED NOT DETECTED Final    Comment: (NOTE) The GeneXpert MRSA Assay (FDA approved for NASAL specimens only), is one component of a comprehensive MRSA colonization surveillance program. It is not intended to diagnose MRSA infection nor to guide or monitor treatment for MRSA infections. Test performance is not FDA approved in patients less than 66 years old. Performed at [facility], [address] [place], [place] [address]   Aerobic/Anaerobic Culture w Gram Stain (surgical/deep wound)     Status: None   Collection Time: 02/03/24  5:01 PM   Specimen: Joint, Finger; Wound  Result Value Ref Range Status   Specimen Description   Final    FINGER Performed at [facility], [address], [place], [place] [address]    Special Requests   Final    right finger Performed at [facility], [address] [place], [place] [address]    Gram Stain   Final    FEW WBC SEEN FEW GRAM POSITIVE COCCI RARE GRAM NEGATIVE RODS    Culture   Final    FEW METHICILLIN RESISTANT STAPHYLOCOCCUS AUREUS RARE PSEUDOMONAS AERUGINOSA MODERATE ENTEROCOCCUS FAECALIS NO ANAEROBES ISOLATED Performed at [facility], [address] [place], [place] [address]    Report Status 02/09/2024 FINAL  Final   Organism ID, Bacteria METHICILLIN RESISTANT STAPHYLOCOCCUS AUREUS  Final   Organism ID, Bacteria PSEUDOMONAS AERUGINOSA  Final   Organism ID, Bacteria ENTEROCOCCUS  FAECALIS  Final      Susceptibility   Enterococcus faecalis - MIC*    AMPICILLIN  <=2 SENSITIVE Sensitive     VANCOMYCIN  2 SENSITIVE Sensitive     GENTAMICIN SYNERGY SENSITIVE Sensitive     CIPROFLOXACIN  Value in next row Sensitive      SENSITIVE1    * MODERATE ENTEROCOCCUS FAECALIS   Methicillin resistant staphylococcus aureus - MIC*     CIPROFLOXACIN  Value in next row Resistant      SENSITIVE1    ERYTHROMYCIN Value in next row Resistant      SENSITIVE1    GENTAMICIN Value in next row Sensitive      SENSITIVE1    OXACILLIN Value in next row Resistant      SENSITIVE1    TETRACYCLINE Value in next row Sensitive      SENSITIVE1    VANCOMYCIN  Value in next row Sensitive      SENSITIVE1    TRIMETH/SULFA Value in next row Resistant      SENSITIVE1    CLINDAMYCIN  Value in next row Sensitive      SENSITIVE1    RIFAMPIN Value in next row Sensitive      SENSITIVE1    Inducible Clindamycin  Value in next row Sensitive      SENSITIVE1    LINEZOLID  Value in next row Sensitive      SENSITIVE1    * FEW METHICILLIN RESISTANT STAPHYLOCOCCUS AUREUS   Pseudomonas aeruginosa - MIC*    MEROPENEM Value in next row Sensitive      SENSITIVE1    CIPROFLOXACIN  Value in next row Sensitive      SENSITIVE1    IMIPENEM Value in next row Sensitive      SENSITIVE1    PIP/TAZO Value in next row Sensitive      <=4 SENSITIVEThis is a modified FDA-approved test that has been validated and its performance characteristics determined by the reporting laboratory.  This laboratory is certified under the Clinical Laboratory Improvement Amendments CLIA as qualified to perform high complexity clinical laboratory testing.    CEFEPIME  Value in next row Sensitive      <=4 SENSITIVEThis is a modified FDA-approved test that has been validated and its performance characteristics determined by the reporting laboratory.  This laboratory is certified under the Clinical Laboratory Improvement Amendments CLIA as qualified to perform high complexity clinical laboratory testing.    CEFTAZIDIME/AVIBACTAM Value in next row Sensitive      <=4 SENSITIVEThis is a modified FDA-approved test that has been validated and its performance characteristics determined by the reporting laboratory.  This laboratory is certified under the Clinical Laboratory Improvement Amendments CLIA as  qualified to perform high complexity clinical laboratory testing.    CEFTOLOZANE/TAZOBACTAM Value in next row Sensitive      <=4 SENSITIVEThis is a modified FDA-approved test that has been validated and its performance characteristics determined by the reporting laboratory.  This laboratory is certified under the Clinical Laboratory Improvement Amendments CLIA as qualified to perform high complexity clinical laboratory testing.    TOBRAMYCIN Value in next row Sensitive      <=4 SENSITIVEThis is a modified FDA-approved test that has been validated and its performance characteristics determined by the reporting laboratory.  This laboratory is certified under the Clinical Laboratory Improvement Amendments CLIA as qualified to perform high complexity clinical laboratory testing.    CEFTAZIDIME Value in next row Sensitive      <=4 SENSITIVEThis is a modified FDA-approved test that has been validated and its performance  characteristics determined by the reporting laboratory.  This laboratory is certified under the Clinical Laboratory Improvement Amendments CLIA as qualified to perform high complexity clinical laboratory testing.    * RARE PSEUDOMONAS AERUGINOSA    Labs: CBC: Recent Labs  Lab 02/02/24 1315 02/02/24 2144 02/03/24 0425 02/04/24 0402 02/07/24 0443  WBC  --   --  5.7 5.2 4.4  HGB 7.1* 7.9* 8.0* 7.8* 8.2*  HCT 22.5* 25.2* 24.2* 24.3* 26.6*  MCV  --   --  80.1 82.1 85.5  PLT  --   --  178 196 211   Basic Metabolic Panel: Recent Labs  Lab 02/03/24 0425 02/04/24 0402 02/07/24 0443  NA 135 134* 138  K 4.0 4.2 4.2  CL 104 103 102  CO2 27 28 29   GLUCOSE 145* 143* 139*  BUN 14 13 15   CREATININE 0.60* 0.75 0.62  CALCIUM  8.0* 7.9* 7.9*  MG 2.1  --  2.1   Liver Function Tests: No results for input(s): AST, ALT, ALKPHOS, BILITOT, PROT, ALBUMIN  in the last 168 hours. CBG: Recent Labs  Lab 02/08/24 1156 02/08/24 1655 02/08/24 2227 02/09/24 0807 02/09/24 1214   GLUCAP 207* 137* 201* 129* 249*    Discharge time spent: greater than 30 minutes.  Signed: Nena Rebel, MD [facility] 02/09/2024

## 2024-02-09 NOTE — TOC Transition Note (Addendum)
 Transition of Care [facility]) - Discharge Note   Patient Details  Name: Russell [name] MRN: [id number] Date of Birth: 1965-07-13  [facility] CM/SW Contact:  Shasta DELENA Daring, RN Phone Number: 02/09/2024, 2:02 PM   Clinical Narrative:      Per MD patient ready for DC to . RN, patient, and facility notified of DC. Discharge Summary sent to facility. RN given number for report. DC packet on chart. Ambulance transport requested for patient.   TOC signing off.    Final next level of care: Skilled Nursing Facility Barriers to Discharge: Barriers Resolved   Patient Goals and CMS Choice            Discharge Placement              Patient chooses bed at:  [facility]) Patient to be transferred to facility by: [facility] Name of family member notified: Patient    Discharge Plan and Services Additional resources added to the After Visit Summary for                                    Representative spoke with at [facility] Agency: Brittany  Social Drivers of Health (SDOH) Interventions SDOH Screenings   Food Insecurity: Food Insecurity Present (02/02/2024)  Housing: High Risk (02/02/2024)  Transportation Needs: Unmet Transportation Needs (02/02/2024)  Utilities: Not At Risk (02/02/2024)  Financial Resource Strain: Low Risk  (04/04/2023)   Received from [facility]  Tobacco Use: Low Risk (02/02/2024)     Readmission Risk Interventions    03/03/2023   12:27 PM 10/25/2022    3:55 PM  Readmission Risk Prevention Plan  Transportation Screening Complete Complete  PCP or Specialist Appt within 3-5 Days  Complete  HRI or Home Care Consult  Complete  Social Work Consult for Recovery Care Planning/Counseling  Complete  Palliative Care Screening  Not Applicable  Medication Review [employer]) Complete Complete  PCP or Specialist appointment within 3-5 days of discharge Complete   HRI or Home Care Consult Complete   Palliative Care Screening Not  Applicable   Skilled Nursing Facility Not Applicable
